# Patient Record
Sex: Male | Born: 1935
Health system: Southern US, Community
[De-identification: ages and names within clinical notes are randomized; demographics above are authoritative.]

## PROBLEM LIST (undated history)

## (undated) DIAGNOSIS — M1990 Unspecified osteoarthritis, unspecified site: Secondary | ICD-10-CM

## (undated) DIAGNOSIS — Z803 Family history of malignant neoplasm of breast: Secondary | ICD-10-CM

## (undated) DIAGNOSIS — J189 Pneumonia, unspecified organism: Secondary | ICD-10-CM

## (undated) DIAGNOSIS — Z860100 Personal history of colon polyps, unspecified: Secondary | ICD-10-CM

## (undated) DIAGNOSIS — K5730 Diverticulosis of large intestine without perforation or abscess without bleeding: Secondary | ICD-10-CM

## (undated) DIAGNOSIS — C61 Malignant neoplasm of prostate: Secondary | ICD-10-CM

## (undated) DIAGNOSIS — I10 Essential (primary) hypertension: Secondary | ICD-10-CM

## (undated) DIAGNOSIS — I2699 Other pulmonary embolism without acute cor pulmonale: Secondary | ICD-10-CM

## (undated) DIAGNOSIS — Z8042 Family history of malignant neoplasm of prostate: Secondary | ICD-10-CM

## (undated) DIAGNOSIS — R0989 Other specified symptoms and signs involving the circulatory and respiratory systems: Secondary | ICD-10-CM

## (undated) HISTORY — DX: Diverticulosis of large intestine without perforation or abscess without bleeding: K57.30

## (undated) HISTORY — DX: Personal history of colon polyps, unspecified: Z86.0100

## (undated) HISTORY — DX: Personal history of colonic polyps: Z86.010

## (undated) HISTORY — PX: COLONOSCOPY: SHX174

## (undated) HISTORY — DX: Essential (primary) hypertension: I10

## (undated) HISTORY — DX: Family history of malignant neoplasm of breast: Z80.3

## (undated) HISTORY — DX: Malignant neoplasm of prostate: C61

## (undated) HISTORY — DX: Family history of malignant neoplasm of prostate: Z80.42

---

## 1987-09-12 HISTORY — PX: KNEE SURGERY: SHX244

## 1999-09-19 ENCOUNTER — Ambulatory Visit (HOSPITAL_COMMUNITY): Admission: RE | Admit: 1999-09-19 | Discharge: 1999-09-19 | Payer: Self-pay | Admitting: Gastroenterology

## 1999-09-19 ENCOUNTER — Encounter: Payer: Self-pay | Admitting: Gastroenterology

## 2001-03-29 ENCOUNTER — Other Ambulatory Visit: Admission: RE | Admit: 2001-03-29 | Discharge: 2001-03-29 | Payer: Self-pay | Admitting: Gastroenterology

## 2001-03-29 ENCOUNTER — Encounter (INDEPENDENT_AMBULATORY_CARE_PROVIDER_SITE_OTHER): Payer: Self-pay | Admitting: Specialist

## 2004-07-19 ENCOUNTER — Ambulatory Visit: Payer: Self-pay | Admitting: Internal Medicine

## 2004-11-08 ENCOUNTER — Ambulatory Visit: Payer: Self-pay | Admitting: Internal Medicine

## 2005-02-03 ENCOUNTER — Ambulatory Visit: Payer: Self-pay | Admitting: Internal Medicine

## 2005-07-04 ENCOUNTER — Ambulatory Visit: Payer: Self-pay | Admitting: Gastroenterology

## 2005-07-25 ENCOUNTER — Ambulatory Visit: Payer: Self-pay | Admitting: Gastroenterology

## 2005-08-14 ENCOUNTER — Ambulatory Visit: Payer: Self-pay | Admitting: Internal Medicine

## 2005-08-18 ENCOUNTER — Ambulatory Visit: Payer: Self-pay | Admitting: Internal Medicine

## 2006-09-13 ENCOUNTER — Ambulatory Visit: Payer: Self-pay | Admitting: Internal Medicine

## 2006-09-13 LAB — CONVERTED CEMR LAB
ALT: 30 units/L (ref 0–40)
AST: 28 units/L (ref 0–37)
Albumin: 3.8 g/dL (ref 3.5–5.2)
Alkaline Phosphatase: 49 units/L (ref 39–117)
BUN: 15 mg/dL (ref 6–23)
Basophils Absolute: 0 10*3/uL (ref 0.0–0.1)
Basophils Relative: 0.6 % (ref 0.0–1.0)
Bilirubin Urine: NEGATIVE
CO2: 32 meq/L (ref 19–32)
Calcium: 9.1 mg/dL (ref 8.4–10.5)
Chloride: 102 meq/L (ref 96–112)
Chol/HDL Ratio, serum: 4.2
Cholesterol: 190 mg/dL (ref 0–200)
Creatinine, Ser: 1.1 mg/dL (ref 0.4–1.5)
Eosinophil percent: 4.9 % (ref 0.0–5.0)
GFR calc non Af Amer: 71 mL/min
Glomerular Filtration Rate, Af Am: 85 mL/min/{1.73_m2}
Glucose, Bld: 105 mg/dL — ABNORMAL HIGH (ref 70–99)
HCT: 42 % (ref 39.0–52.0)
HDL: 45.6 mg/dL (ref >39.0)
Hemoglobin, Urine: NEGATIVE
Hemoglobin: 14.4 g/dL (ref 13.0–17.0)
Ketones, ur: NEGATIVE mg/dL
LDL Cholesterol: 131 mg/dL — ABNORMAL HIGH (ref 0–99)
Leukocytes, UA: NEGATIVE
Lymphocytes Relative: 39.9 % (ref 12.0–46.0)
MCHC: 34.3 g/dL (ref 30.0–36.0)
MCV: 95.3 fL (ref 78.0–100.0)
Monocytes Absolute: 0.5 10*3/uL (ref 0.2–0.7)
Monocytes Relative: 11.4 % — ABNORMAL HIGH (ref 3.0–11.0)
Neutro Abs: 1.9 10*3/uL (ref 1.4–7.7)
Neutrophils Relative %: 43.2 % (ref 43.0–77.0)
Nitrite: NEGATIVE
PSA: 1.66 ng/mL (ref 0.10–4.00)
Platelets: 222 10*3/uL (ref 150–400)
Potassium: 4.1 meq/L (ref 3.5–5.1)
RBC: 4.4 M/uL (ref 4.22–5.81)
RDW: 12 % (ref 11.5–14.6)
Sodium: 140 meq/L (ref 135–145)
Specific Gravity, Urine: 1.025 (ref 1.000–1.03)
TSH: 1.24 microintl units/mL (ref 0.35–5.50)
Total Bilirubin: 1.4 mg/dL — ABNORMAL HIGH (ref 0.3–1.2)
Total Protein, Urine: NEGATIVE mg/dL
Total Protein: 6.6 g/dL (ref 6.0–8.3)
Triglyceride fasting, serum: 68 mg/dL (ref 0–149)
Urine Glucose: NEGATIVE mg/dL
Urobilinogen, UA: 0.2 (ref 0.0–1.0)
VLDL: 14 mg/dL (ref 0–40)
WBC: 4.4 10*3/uL — ABNORMAL LOW (ref 4.5–10.5)
pH: 6.5 (ref 5.0–8.0)

## 2006-09-20 ENCOUNTER — Ambulatory Visit: Payer: Self-pay | Admitting: Internal Medicine

## 2007-06-10 ENCOUNTER — Encounter: Payer: Self-pay | Admitting: *Deleted

## 2007-06-10 DIAGNOSIS — Z860100 Personal history of colon polyps, unspecified: Secondary | ICD-10-CM | POA: Insufficient documentation

## 2007-06-10 DIAGNOSIS — K5730 Diverticulosis of large intestine without perforation or abscess without bleeding: Secondary | ICD-10-CM | POA: Insufficient documentation

## 2007-06-10 DIAGNOSIS — I10 Essential (primary) hypertension: Secondary | ICD-10-CM | POA: Insufficient documentation

## 2007-09-23 ENCOUNTER — Ambulatory Visit: Payer: Self-pay | Admitting: Internal Medicine

## 2007-09-23 LAB — CONVERTED CEMR LAB
ALT: 28 units/L (ref 0–53)
AST: 27 units/L (ref 0–37)
Albumin: 4 g/dL (ref 3.5–5.2)
Alkaline Phosphatase: 55 units/L (ref 39–117)
BUN: 10 mg/dL (ref 6–23)
Basophils Absolute: 0 10*3/uL (ref 0.0–0.1)
Basophils Relative: 0.2 % (ref 0.0–1.0)
Bilirubin Urine: NEGATIVE
Bilirubin, Direct: 0.2 mg/dL (ref 0.0–0.3)
CO2: 33 meq/L — ABNORMAL HIGH (ref 19–32)
Calcium: 9.1 mg/dL (ref 8.4–10.5)
Chloride: 100 meq/L (ref 96–112)
Cholesterol: 203 mg/dL (ref 0–200)
Creatinine, Ser: 1 mg/dL (ref 0.4–1.5)
Direct LDL: 126.3 mg/dL
Eosinophils Absolute: 0.1 10*3/uL (ref 0.0–0.6)
Eosinophils Relative: 2 % (ref 0.0–5.0)
GFR calc Af Amer: 95 mL/min
GFR calc non Af Amer: 79 mL/min
Glucose, Bld: 111 mg/dL — ABNORMAL HIGH (ref 70–99)
HCT: 42.8 % (ref 39.0–52.0)
HDL: 40.4 mg/dL (ref >39.0)
Hemoglobin, Urine: NEGATIVE
Hemoglobin: 14.7 g/dL (ref 13.0–17.0)
Ketones, ur: NEGATIVE mg/dL
Leukocytes, UA: NEGATIVE
Lymphocytes Relative: 26.3 % (ref 12.0–46.0)
MCHC: 34.4 g/dL (ref 30.0–36.0)
MCV: 94.4 fL (ref 78.0–100.0)
Monocytes Absolute: 0.4 10*3/uL (ref 0.2–0.7)
Monocytes Relative: 7.8 % (ref 3.0–11.0)
Neutro Abs: 3.3 10*3/uL (ref 1.4–7.7)
Neutrophils Relative %: 63.7 % (ref 43.0–77.0)
Nitrite: NEGATIVE
PSA: 2.05 ng/mL (ref 0.10–4.00)
Platelets: 209 10*3/uL (ref 150–400)
Potassium: 4.6 meq/L (ref 3.5–5.1)
RBC: 4.53 M/uL (ref 4.22–5.81)
RDW: 12.2 % (ref 11.5–14.6)
Sodium: 139 meq/L (ref 135–145)
Specific Gravity, Urine: 1.015 (ref 1.000–1.03)
TSH: 0.81 microintl units/mL (ref 0.35–5.50)
Total Bilirubin: 1.4 mg/dL — ABNORMAL HIGH (ref 0.3–1.2)
Total CHOL/HDL Ratio: 5
Total Protein, Urine: NEGATIVE mg/dL
Total Protein: 7 g/dL (ref 6.0–8.3)
Triglycerides: 81 mg/dL (ref 0–149)
Urine Glucose: NEGATIVE mg/dL
Urobilinogen, UA: 0.2 (ref 0.0–1.0)
VLDL: 16 mg/dL (ref 0–40)
WBC: 5.1 10*3/uL (ref 4.5–10.5)
pH: 7 (ref 5.0–8.0)

## 2007-09-26 ENCOUNTER — Encounter: Payer: Self-pay | Admitting: Internal Medicine

## 2007-09-27 ENCOUNTER — Ambulatory Visit: Payer: Self-pay | Admitting: Internal Medicine

## 2008-03-23 ENCOUNTER — Ambulatory Visit: Payer: Self-pay | Admitting: Internal Medicine

## 2008-03-23 LAB — CONVERTED CEMR LAB
BUN: 13 mg/dL (ref 6–23)
CO2: 32 meq/L (ref 19–32)
Calcium: 9.3 mg/dL (ref 8.4–10.5)
Chloride: 105 meq/L (ref 96–112)
Creatinine, Ser: 1.1 mg/dL (ref 0.4–1.5)
GFR calc Af Amer: 85 mL/min
GFR calc non Af Amer: 70 mL/min
Glucose, Bld: 105 mg/dL — ABNORMAL HIGH (ref 70–99)
Potassium: 4.2 meq/L (ref 3.5–5.1)
Sodium: 142 meq/L (ref 135–145)

## 2008-03-25 ENCOUNTER — Ambulatory Visit: Payer: Self-pay | Admitting: Internal Medicine

## 2008-07-01 ENCOUNTER — Ambulatory Visit: Payer: Self-pay | Admitting: Internal Medicine

## 2008-08-26 ENCOUNTER — Ambulatory Visit: Payer: Self-pay | Admitting: Internal Medicine

## 2008-08-26 LAB — CONVERTED CEMR LAB
BUN: 12 mg/dL (ref 6–23)
CO2: 31 meq/L (ref 19–32)
Calcium: 9 mg/dL (ref 8.4–10.5)
Chloride: 104 meq/L (ref 96–112)
Creatinine, Ser: 1.1 mg/dL (ref 0.4–1.5)
GFR calc Af Amer: 85 mL/min
GFR calc non Af Amer: 70 mL/min
Glucose, Bld: 99 mg/dL (ref 70–99)
Potassium: 4 meq/L (ref 3.5–5.1)
Sodium: 140 meq/L (ref 135–145)

## 2008-09-02 ENCOUNTER — Ambulatory Visit: Payer: Self-pay | Admitting: Internal Medicine

## 2009-02-18 ENCOUNTER — Ambulatory Visit: Payer: Self-pay | Admitting: Internal Medicine

## 2009-02-18 LAB — CONVERTED CEMR LAB
ALT: 22 units/L (ref 0–53)
AST: 27 units/L (ref 0–37)
Albumin: 3.9 g/dL (ref 3.5–5.2)
Alkaline Phosphatase: 51 units/L (ref 39–117)
BUN: 14 mg/dL (ref 6–23)
Basophils Absolute: 0 10*3/uL (ref 0.0–0.1)
Basophils Relative: 0.2 % (ref 0.0–3.0)
Bilirubin Urine: NEGATIVE
Bilirubin, Direct: 0.2 mg/dL (ref 0.0–0.3)
CO2: 31 meq/L (ref 19–32)
Calcium: 8.9 mg/dL (ref 8.4–10.5)
Chloride: 105 meq/L (ref 96–112)
Cholesterol: 166 mg/dL (ref 0–200)
Creatinine, Ser: 1.1 mg/dL (ref 0.4–1.5)
Eosinophils Absolute: 0.1 10*3/uL (ref 0.0–0.7)
Eosinophils Relative: 3.7 % (ref 0.0–5.0)
GFR calc non Af Amer: 84.71 mL/min (ref >60)
Glucose, Bld: 96 mg/dL (ref 70–99)
HCT: 42.4 % (ref 39.0–52.0)
HDL: 38.3 mg/dL — ABNORMAL LOW (ref >39.00)
Hemoglobin, Urine: NEGATIVE
Hemoglobin: 14 g/dL (ref 13.0–17.0)
Ketones, ur: NEGATIVE mg/dL
LDL Cholesterol: 113 mg/dL — ABNORMAL HIGH (ref 0–99)
Leukocytes, UA: NEGATIVE
Lymphocytes Relative: 40.8 % (ref 12.0–46.0)
Lymphs Abs: 1.6 10*3/uL (ref 0.7–4.0)
MCHC: 33.1 g/dL (ref 30.0–36.0)
MCV: 97.2 fL (ref 78.0–100.0)
Monocytes Absolute: 0.5 10*3/uL (ref 0.1–1.0)
Monocytes Relative: 12.2 % — ABNORMAL HIGH (ref 3.0–12.0)
Neutro Abs: 1.7 10*3/uL (ref 1.4–7.7)
Neutrophils Relative %: 43.1 % (ref 43.0–77.0)
Nitrite: NEGATIVE
PSA: 2.99 ng/mL (ref 0.10–4.00)
Platelets: 184 10*3/uL (ref 150.0–400.0)
Potassium: 4.2 meq/L (ref 3.5–5.1)
RBC: 4.37 M/uL (ref 4.22–5.81)
RDW: 11.9 % (ref 11.5–14.6)
Sodium: 142 meq/L (ref 135–145)
Specific Gravity, Urine: 1.02 (ref 1.000–1.030)
TSH: 1.13 microintl units/mL (ref 0.35–5.50)
Total Bilirubin: 1.1 mg/dL (ref 0.3–1.2)
Total CHOL/HDL Ratio: 4
Total Protein, Urine: NEGATIVE mg/dL
Total Protein: 6.8 g/dL (ref 6.0–8.3)
Triglycerides: 72 mg/dL (ref 0.0–149.0)
Urine Glucose: NEGATIVE mg/dL
Urobilinogen, UA: 0.2 (ref 0.0–1.0)
VLDL: 14.4 mg/dL (ref 0.0–40.0)
WBC: 3.9 10*3/uL — ABNORMAL LOW (ref 4.5–10.5)
pH: 6.5 (ref 5.0–8.0)

## 2009-03-03 ENCOUNTER — Ambulatory Visit: Payer: Self-pay | Admitting: Internal Medicine

## 2009-03-03 DIAGNOSIS — N529 Male erectile dysfunction, unspecified: Secondary | ICD-10-CM | POA: Insufficient documentation

## 2009-03-03 DIAGNOSIS — R972 Elevated prostate specific antigen [PSA]: Secondary | ICD-10-CM

## 2009-08-18 ENCOUNTER — Ambulatory Visit: Payer: Self-pay | Admitting: Internal Medicine

## 2009-08-18 LAB — CONVERTED CEMR LAB: PSA: 3.26 ng/mL (ref 0.10–4.00)

## 2009-08-25 ENCOUNTER — Ambulatory Visit: Payer: Self-pay | Admitting: Internal Medicine

## 2009-08-25 DIAGNOSIS — Z87891 Personal history of nicotine dependence: Secondary | ICD-10-CM

## 2011-01-27 NOTE — Assessment & Plan Note (Signed)
[name]                           PRIMARY CARE OFFICE NOTE   Peter Wood, Peter Wood                     MRN:          [id number]  DATE:09/20/2006                            DOB:          Mar 23, 1935    The patient is a 76 year old male who presents for a wellness  examination.   Past medical history, family history, social history as per July 19, 2004 note.   CURRENT MEDICATIONS:  Has not been taking any.   ALLERGIES:  None.   REVIEW OF SYSTEMS:  Negative for chest pain or shortness of breath.  No  syncope, no neurology complaints.  The rest of the 18-point system  review is negative.   PHYSICAL EXAMINATION:  He looks well.  Blood pressure 143/82, pulse 58, temp 97.4, weight 173 pounds.  He is in no acute distress.  HEENT:  Moist mucosa.  NECK:  Supple, no thyromegaly or bruit.  LUNGS:  Clear to auscultation and percussion, no wheeze or rales.  HEART:  S1, S2, no murmur, no gallop.  ABDOMEN:  Soft, nontender, without organomegaly or masses felt.  LOWER EXTREMITIES:  Without edema.  He is alert, oriented and cooperative, denies being depressed.  RECTAL:  Examination reveals normal prostate, stool guaiac negative.  No  masses.   Labs on September 13, 2006:  CBC normal, CMET normal, glucose 105, TSH  normal, PSA 1.6, urinalysis normal, cholesterol 190, LDL 141.   ASSESSMENT AND PLAN:  1. Normal wellness examination.  Age/health-related issues discussed.      Healthy lifestyle discussed.  Repeat exam in 12 months.  Chest x-      ray was done in 2005.  We will obtain another.  Start baby aspirin      daily.  2. History of elevated blood pressure.  Controlled with lifestyle.  If      systolic gets greater than 140 I would start back on enalapril/HCT.  3. History of colon polyps.  He will be due colonoscopy in 2013.     Georgina Quint. Plotnikov, MD  Electronically Signed    AVP/MedQ  DD: 09/22/2006  DT: 09/22/2006  Job #: [id number]

## 2011-08-31 ENCOUNTER — Encounter: Payer: Self-pay | Admitting: Internal Medicine

## 2011-09-01 ENCOUNTER — Ambulatory Visit (INDEPENDENT_AMBULATORY_CARE_PROVIDER_SITE_OTHER): Payer: Medicare Other | Admitting: Internal Medicine

## 2011-09-01 ENCOUNTER — Encounter: Payer: Self-pay | Admitting: Internal Medicine

## 2011-09-01 VITALS — BP 148/98 | HR 80 | Temp 97.7°F | Resp 16 | Wt 171.0 lb

## 2011-09-01 DIAGNOSIS — Z86010 Personal history of colonic polyps: Secondary | ICD-10-CM

## 2011-09-01 DIAGNOSIS — Z7189 Other specified counseling: Secondary | ICD-10-CM | POA: Insufficient documentation

## 2011-09-01 DIAGNOSIS — I10 Essential (primary) hypertension: Secondary | ICD-10-CM

## 2011-09-01 DIAGNOSIS — Z0000 Encounter for general adult medical examination without abnormal findings: Secondary | ICD-10-CM

## 2011-09-01 DIAGNOSIS — N320 Bladder-neck obstruction: Secondary | ICD-10-CM

## 2011-09-01 DIAGNOSIS — E785 Hyperlipidemia, unspecified: Secondary | ICD-10-CM

## 2011-09-01 MED ORDER — VITAMIN D 1000 UNITS PO TABS: 1000.0000 [IU] | ORAL_TABLET | Freq: Every day | ORAL | Status: AC

## 2011-09-01 MED ORDER — AMLODIPINE BESY-BENAZEPRIL HCL 5-20 MG PO CAPS: 1.0000 | ORAL_CAPSULE | Freq: Every day | ORAL | Status: DC

## 2011-09-01 NOTE — Assessment & Plan Note (Signed)
The patient is here for annual Medicare wellness examination and management of other chronic and acute problems.   The risk factors are reflected in the social history.  The roster of all physicians providing medical care to patient - is listed in the Snapshot section of the chart.  Activities of daily living:  The patient is 100% inedpendent in all ADLs: dressing, toileting, feeding as well as independent mobility  Home safety : The patient has smoke detectors in the home. They wear seatbelts.No firearms at home ( firearms are present in the home, kept in a safe fashion). There is no violence in the home.   There is no risks for hepatitis, STDs or HIV. There is no   history of blood transfusion. They have no travel history to infectious disease endemic areas of the world.  The patient has (has not) seen their dentist in the last six month. They have (not) seen their eye doctor in the last year. They deny (admit to) any hearing difficulty and have not had audiologic testing in the last year.  They do not  have excessive sun exposure. Discussed the need for sun protection: hats, long sleeves and use of sunscreen if there is significant sun exposure.   Diet: the importance of a healthy diet is discussed. They do have a healthy (unhealthy-high fat/fast food) diet.  The patient has a regular exercise program: 7 per week - walking, springs.  The benefits of regular aerobic exercise were discussed.  Depression screen: there are no signs or vegative symptoms of depression- irritability, change in appetite, anhedonia, sadness/tearfullness.  Cognitive assessment: the patient manages all their financial and personal affairs and is actively engaged. They could relate day,date,year and events; recalled 3/3 objects at 3 minutes; performed clock-face test normally.  The following portions of the patient's history were reviewed and updated as appropriate: allergies, current medications, past family history,  past medical history,  past surgical history, past social history  and problem list.  Vision, hearing, body mass index were assessed and reviewed.   During the course of the visit the patient was educated and counseled about appropriate screening and preventive services including : fall prevention , diabetes screening, nutrition counseling, colorectal cancer screening, and recommended immunizations.

## 2011-09-01 NOTE — Patient Instructions (Signed)
Normal BP<130/85 

## 2011-09-01 NOTE — Assessment & Plan Note (Signed)
Pt states BP is nl at home 

## 2011-09-01 NOTE — Assessment & Plan Note (Signed)
F/u colon 

## 2011-09-01 NOTE — Progress Notes (Signed)
  Subjective:    Patient ID: Peter Wood, male    DOB: 1935/04/20, 76 y.o.   MRN: [id number]  HPI The patient is here for a wellness exam. The patient has been doing well overall without major physical or psychological issues going on lately.  BP is ok at home  Review of Systems  Constitutional: Negative for chills, appetite change, fatigue and unexpected weight change.  HENT: Negative for hearing loss, nosebleeds, congestion, sore throat, sneezing, trouble swallowing and neck pain.   Eyes: Negative for itching and visual disturbance.  Respiratory: Negative for cough.   Cardiovascular: Negative for chest pain, palpitations and leg swelling.  Gastrointestinal: Negative for nausea, diarrhea, blood in stool and abdominal distention.  Genitourinary: Negative for frequency and hematuria.  Musculoskeletal: Negative for back pain, joint swelling, arthralgias and gait problem.  Skin: Negative for color change, rash and wound.  Neurological: Negative for dizziness, tremors, facial asymmetry, speech difficulty, weakness and numbness.  Psychiatric/Behavioral: Negative for suicidal ideas, sleep disturbance, dysphoric mood and agitation. The patient is not nervous/anxious.        Objective:   Physical Exam  Constitutional: He is oriented to person, place, and time. He appears well-developed and well-nourished. No distress.  HENT:  Mouth/Throat: Oropharynx is clear and moist.  Eyes: Conjunctivae are normal. Pupils are equal, round, and reactive to light.  Neck: Normal range of motion. No JVD present. No thyromegaly present.  Cardiovascular: Normal rate, regular rhythm and intact distal pulses.  Exam reveals no gallop and no friction rub.   Murmur (1-2/6 syst) heard. Pulmonary/Chest: Effort normal and breath sounds normal. No respiratory distress. He has no wheezes. He has no rales. He exhibits no tenderness.  Abdominal: Soft. Bowel sounds are normal. He exhibits no distension and no mass.  There is no tenderness. There is no rebound and no guarding.  Genitourinary: Rectum normal, prostate normal and penis normal. Guaiac negative stool. No penile tenderness.  Musculoskeletal: Normal range of motion. He exhibits no edema and no tenderness.  Lymphadenopathy:    He has no cervical adenopathy.  Neurological: He is alert and oriented to person, place, and time. He has normal reflexes. No cranial nerve deficit. He exhibits normal muscle tone. Coordination normal.  Skin: Skin is warm and dry. No rash noted.  Psychiatric: He has a normal mood and affect. His behavior is normal. Judgment and thought content normal.          Assessment & Plan:    He declined all shots

## 2011-09-04 ENCOUNTER — Other Ambulatory Visit (INDEPENDENT_AMBULATORY_CARE_PROVIDER_SITE_OTHER): Payer: Medicare Other

## 2011-09-04 DIAGNOSIS — N320 Bladder-neck obstruction: Secondary | ICD-10-CM

## 2011-09-04 DIAGNOSIS — E785 Hyperlipidemia, unspecified: Secondary | ICD-10-CM

## 2011-09-04 DIAGNOSIS — Z0000 Encounter for general adult medical examination without abnormal findings: Secondary | ICD-10-CM

## 2011-09-04 LAB — LIPID PANEL
Cholesterol: 167 mg/dL (ref 0–200)
HDL: 40.4 mg/dL (ref >39.00)
LDL Cholesterol: 108 mg/dL — ABNORMAL HIGH (ref 0–99)
Total CHOL/HDL Ratio: 4
Triglycerides: 92 mg/dL (ref 0.0–149.0)
VLDL: 18.4 mg/dL (ref 0.0–40.0)

## 2011-09-04 LAB — CBC WITH DIFFERENTIAL/PLATELET
Basophils Relative: 0.5 % (ref 0.0–3.0)
Eosinophils Relative: 5.7 % — ABNORMAL HIGH (ref 0.0–5.0)
Lymphocytes Relative: 30.9 % (ref 12.0–46.0)
MCV: 97 fl (ref 78.0–100.0)
Monocytes Relative: 11.6 % (ref 3.0–12.0)
Neutrophils Relative %: 51.3 % (ref 43.0–77.0)
Platelets: 232 10*3/uL (ref 150.0–400.0)
RBC: 4.23 Mil/uL (ref 4.22–5.81)
WBC: 4.9 10*3/uL (ref 4.5–10.5)

## 2011-09-04 LAB — COMPREHENSIVE METABOLIC PANEL
AST: 26 U/L (ref 0–37)
Albumin: 3.9 g/dL (ref 3.5–5.2)
Alkaline Phosphatase: 54 U/L (ref 39–117)
BUN: 15 mg/dL (ref 6–23)
Creatinine, Ser: 1 mg/dL (ref 0.4–1.5)
Glucose, Bld: 92 mg/dL (ref 70–99)
Potassium: 4 mEq/L (ref 3.5–5.1)

## 2011-09-04 LAB — URINALYSIS
Hgb urine dipstick: NEGATIVE
Nitrite: NEGATIVE
Specific Gravity, Urine: 1.02 (ref 1.000–1.030)
Urine Glucose: NEGATIVE
Urobilinogen, UA: 0.2 (ref 0.0–1.0)

## 2011-09-06 ENCOUNTER — Other Ambulatory Visit: Payer: Self-pay | Admitting: *Deleted

## 2011-09-06 DIAGNOSIS — R972 Elevated prostate specific antigen [PSA]: Secondary | ICD-10-CM

## 2011-09-26 ENCOUNTER — Encounter: Payer: Self-pay | Admitting: Gastroenterology

## 2011-10-20 ENCOUNTER — Ambulatory Visit (AMBULATORY_SURGERY_CENTER): Payer: Medicare Other | Admitting: *Deleted

## 2011-10-20 VITALS — Ht 67.5 in | Wt 170.0 lb

## 2011-10-20 DIAGNOSIS — Z1211 Encounter for screening for malignant neoplasm of colon: Secondary | ICD-10-CM

## 2011-10-20 MED ORDER — PEG-KCL-NACL-NASULF-NA ASC-C 100 G PO SOLR: ORAL | Status: DC

## 2011-11-03 ENCOUNTER — Encounter: Payer: Self-pay | Admitting: Gastroenterology

## 2011-11-03 ENCOUNTER — Ambulatory Visit (AMBULATORY_SURGERY_CENTER): Payer: Medicare Other | Admitting: Gastroenterology

## 2011-11-03 VITALS — BP 164/82 | HR 60 | Temp 98.0°F | Resp 20 | Ht 67.5 in | Wt 170.0 lb

## 2011-11-03 DIAGNOSIS — Z860100 Personal history of colon polyps, unspecified: Secondary | ICD-10-CM

## 2011-11-03 DIAGNOSIS — Z1211 Encounter for screening for malignant neoplasm of colon: Secondary | ICD-10-CM

## 2011-11-03 DIAGNOSIS — D126 Benign neoplasm of colon, unspecified: Secondary | ICD-10-CM

## 2011-11-03 DIAGNOSIS — K5730 Diverticulosis of large intestine without perforation or abscess without bleeding: Secondary | ICD-10-CM

## 2011-11-03 MED ORDER — SODIUM CHLORIDE 0.9 % IV SOLN: 500.0000 mL | INTRAVENOUS | Status: DC

## 2011-11-03 NOTE — Patient Instructions (Signed)

## 2011-11-03 NOTE — Progress Notes (Signed)
Pt given information on polyps, diverticulosis and high fiber diets  Patient did not experience any of the following events: a burn prior to discharge; a fall within the facility; wrong site/side/patient/procedure/implant event; or a hospital transfer or hospital admission upon discharge from the facility. [phone]) Patient did not have preoperative order for IV antibiotic SSI prophylaxis. [phone])

## 2011-11-03 NOTE — Op Note (Signed)
[facility] [address] [place], [place]  [address]  COLONOSCOPY PROCEDURE REPORT  PATIENT:  Peter Wood, Peter Wood  MR#:  [id number] BIRTHDATE:  1934/11/09, 76 yrs. old  GENDER:  male ENDOSCOPIST:  Rachael Fee, MD PROCEDURE DATE:  11/03/2011 PROCEDURE:  Colonoscopy with snare polypectomy ASA CLASS:  Class II INDICATIONS:  2002 colonoscopy with TA, 2006 colonsocopy no polyps (recommended by [facility] to have repeat exam in 7 years) MEDICATIONS:   Fentanyl 50 mcg IV, These medications were titrated to patient response per physician's verbal order, Versed 3 mg IV  DESCRIPTION OF PROCEDURE:   After the risks benefits and alternatives of the procedure were thoroughly explained, informed consent was obtained.  Digital rectal exam was performed and revealed no rectal masses.   The LB CF-[id number] endoscope was introduced through the anus and advanced to the cecum, which was identified by both the appendix and ileocecal valve, without limitations.  The quality of the prep was good..  The instrument was then slowly withdrawn as the colon was fully examined. <<PROCEDUREIMAGES>> FINDINGS: Two sessile polyps were found, removed with cold snare, sent to pathology (jar 1). These ranged in size from 3 to 5mm, located in sigmoid and rectum segments (see image5 and image6). Mild diverticulosis was found in the sigmoid to descending colon segments (see image1).  This was otherwise a normal examination of the colon (see image3, image4, and image7).   Retroflexed views in the rectum revealed no abnormalities. COMPLICATIONS:  None  ENDOSCOPIC IMPRESSION: 1) Two small polyps, both removed and both sent to pathology 2) Mild diverticulosis in the sigmoid to descending colon segments 3) Otherwise normal examination  RECOMMENDATIONS: 1) Given your personal history of adenomatous (pre-cancerous) polyps, you will need a repeat colonoscopy in 5 years even if the polyps removed today are NOT  precancerous 2) You will receive a letter within 1-2 weeks with the results of your biopsy as well as final recommendations. Please call my office if you have not received a letter after 3 weeks. 3) You DO NOT NEED TO AVOID SEEDS, NUTS, BERRIES.  ______________________________ Rachael Fee, MD  n. eSIGNED:   Rachael Fee at 11/03/2011 09:47 AM  Milus Banister, [phone]

## 2011-11-06 ENCOUNTER — Telehealth: Payer: Self-pay | Admitting: *Deleted

## 2011-11-06 NOTE — Telephone Encounter (Signed)
  Follow up Call-  Call back number 11/03/2011  Post procedure Call Back phone  # [phone]  Permission to leave phone message Yes     Patient questions:  Do you have a fever, pain , or abdominal swelling? no Pain Score  0 *  Have you tolerated food without any problems? yes  Have you been able to return to your normal activities? yes  Do you have any questions about your discharge instructions: Diet   no Medications  no Follow up visit  no  Do you have questions or concerns about your Care? no  Actions: * If pain score is 4 or above: No action needed, pain <4.

## 2011-11-08 ENCOUNTER — Encounter: Payer: Self-pay | Admitting: Gastroenterology

## 2011-12-05 ENCOUNTER — Ambulatory Visit (INDEPENDENT_AMBULATORY_CARE_PROVIDER_SITE_OTHER): Payer: Medicare Other | Admitting: Internal Medicine

## 2011-12-05 ENCOUNTER — Encounter: Payer: Self-pay | Admitting: Internal Medicine

## 2011-12-05 VITALS — BP 168/100 | HR 84 | Temp 98.8°F | Resp 16 | Wt 170.0 lb

## 2011-12-05 DIAGNOSIS — I10 Essential (primary) hypertension: Secondary | ICD-10-CM

## 2011-12-05 MED ORDER — BENAZEPRIL HCL 40 MG PO TABS: 40.0000 mg | ORAL_TABLET | Freq: Every day | ORAL | Status: DC

## 2011-12-05 MED ORDER — AMLODIPINE BESYLATE 5 MG PO TABS: 5.0000 mg | ORAL_TABLET | Freq: Every day | ORAL | Status: DC

## 2011-12-05 NOTE — Assessment & Plan Note (Signed)
D/c lotrel Increase Benazepril to 40 mg a day Cont Norvasc

## 2011-12-05 NOTE — Progress Notes (Signed)
Patient ID: Peter Wood, male   DOB: 13-May-1935, 77 y.o.   MRN: [id number]  Subjective:    Patient ID: Peter Wood, male    DOB: June 04, 1935, 77 y.o.   MRN: [id number]  HPI  F/u elev BP  BP Readings from Last 3 Encounters:  12/05/11 168/100  11/03/11 164/82  09/01/11 148/98   Wt Readings from Last 3 Encounters:  12/05/11 170 lb (77.111 kg)  11/03/11 170 lb (77.111 kg)  10/20/11 170 lb (77.111 kg)      Review of Systems  Constitutional: Negative for chills, appetite change, fatigue and unexpected weight change.  HENT: Negative for hearing loss, nosebleeds, congestion, sore throat, sneezing, trouble swallowing and neck pain.   Eyes: Negative for itching and visual disturbance.  Respiratory: Negative for cough.   Cardiovascular: Negative for chest pain, palpitations and leg swelling.  Gastrointestinal: Negative for nausea, diarrhea, blood in stool and abdominal distention.  Genitourinary: Negative for frequency and hematuria.  Musculoskeletal: Negative for back pain, joint swelling, arthralgias and gait problem.  Skin: Negative for color change, rash and wound.  Neurological: Negative for dizziness, tremors, facial asymmetry, speech difficulty, weakness and numbness.  Psychiatric/Behavioral: Negative for suicidal ideas, sleep disturbance, dysphoric mood and agitation. The patient is not nervous/anxious.        Objective:   Physical Exam  Constitutional: He is oriented to person, place, and time. He appears well-developed and well-nourished. No distress.  HENT:  Mouth/Throat: Oropharynx is clear and moist.  Eyes: Conjunctivae are normal. Pupils are equal, round, and reactive to light.  Neck: Normal range of motion. No JVD present. No thyromegaly present.  Cardiovascular: Normal rate, regular rhythm and intact distal pulses.  Exam reveals no gallop and no friction rub.   Murmur (1-2/6 syst) heard. Pulmonary/Chest: Effort normal and breath sounds normal. No respiratory  distress. He has no wheezes. He has no rales. He exhibits no tenderness.  Abdominal: Soft. Bowel sounds are normal. He exhibits no distension and no mass. There is no tenderness. There is no rebound and no guarding.  Genitourinary: Rectum normal, prostate normal and penis normal. Guaiac negative stool. No penile tenderness.  Musculoskeletal: Normal range of motion. He exhibits no edema and no tenderness.  Lymphadenopathy:    He has no cervical adenopathy.  Neurological: He is alert and oriented to person, place, and time. He has normal reflexes. No cranial nerve deficit. He exhibits normal muscle tone. Coordination normal.  Skin: Skin is warm and dry. No rash noted.  Psychiatric: He has a normal mood and affect. His behavior is normal. Judgment and thought content normal.    Lab Results  Component Value Date   WBC 4.9 09/04/2011   HGB 13.7 09/04/2011   HCT 41.0 09/04/2011   PLT 232.0 09/04/2011   GLUCOSE 92 09/04/2011   CHOL 167 09/04/2011   TRIG 92.0 09/04/2011   HDL 40.40 09/04/2011   LDLDIRECT 126.3 09/23/2007   LDLCALC 108* 09/04/2011   ALT 23 09/04/2011   AST 26 09/04/2011   NA 140 09/04/2011   K 4.0 09/04/2011   CL 103 09/04/2011   CREATININE 1.0 09/04/2011   BUN 15 09/04/2011   CO2 31 09/04/2011   TSH 0.92 09/04/2011   PSA 5.63* 09/04/2011         Assessment & Plan:

## 2012-03-11 ENCOUNTER — Other Ambulatory Visit (INDEPENDENT_AMBULATORY_CARE_PROVIDER_SITE_OTHER): Payer: Medicare Other

## 2012-03-11 ENCOUNTER — Encounter: Payer: Self-pay | Admitting: Internal Medicine

## 2012-03-11 ENCOUNTER — Ambulatory Visit (INDEPENDENT_AMBULATORY_CARE_PROVIDER_SITE_OTHER): Payer: Medicare Other | Admitting: Internal Medicine

## 2012-03-11 VITALS — BP 110/88 | HR 64 | Temp 98.4°F | Resp 16 | Wt 165.0 lb

## 2012-03-11 DIAGNOSIS — I10 Essential (primary) hypertension: Secondary | ICD-10-CM

## 2012-03-11 DIAGNOSIS — R972 Elevated prostate specific antigen [PSA]: Secondary | ICD-10-CM

## 2012-03-11 LAB — PSA, TOTAL AND FREE
PSA, Free Pct: 22 % — ABNORMAL LOW (ref >25)
PSA, Free: 1.35 ng/mL
PSA: 6.12 ng/mL — ABNORMAL HIGH (ref <=4.00)

## 2012-03-11 NOTE — Assessment & Plan Note (Signed)
Better -  Continue with current prescription therapy as reflected on the Med list. RTC 12 mo

## 2012-03-11 NOTE — Assessment & Plan Note (Signed)
PSA, free PSA

## 2012-03-11 NOTE — Progress Notes (Signed)
   Subjective:    Patient ID: Peter Wood, male    DOB: [birth year], 77 y.o.   MRN: [id number]  HPI  F/u elev BP, HTN  BP Readings from Last 3 Encounters:  03/11/12 110/88  12/05/11 168/100  11/03/11 164/82   Wt Readings from Last 3 Encounters:  03/11/12 165 lb (74.844 kg)  12/05/11 170 lb (77.111 kg)  11/03/11 170 lb (77.111 kg)      Review of Systems  Constitutional: Negative for chills, appetite change, fatigue and unexpected weight change.  HENT: Negative for hearing loss, nosebleeds, congestion, sore throat, sneezing, trouble swallowing and neck pain.   Eyes: Negative for itching and visual disturbance.  Respiratory: Negative for cough.   Cardiovascular: Negative for chest pain, palpitations and leg swelling.  Gastrointestinal: Negative for nausea, diarrhea, blood in stool and abdominal distention.  Genitourinary: Negative for frequency and hematuria.  Musculoskeletal: Negative for back pain, joint swelling, arthralgias and gait problem.  Skin: Negative for color change, rash and wound.  Neurological: Negative for dizziness, tremors, facial asymmetry, speech difficulty, weakness and numbness.  Psychiatric/Behavioral: Negative for suicidal ideas, disturbed wake/sleep cycle, dysphoric mood and agitation. The patient is not nervous/anxious.        Objective:   Physical Exam  Constitutional: He is oriented to person, place, and time. He appears well-developed and well-nourished. No distress.  HENT:  Mouth/Throat: Oropharynx is clear and moist.  Eyes: Conjunctivae are normal. Pupils are equal, round, and reactive to light.  Neck: Normal range of motion. No JVD present. No thyromegaly present.  Cardiovascular: Normal rate, regular rhythm and intact distal pulses.  Exam reveals no gallop and no friction rub.   Murmur (1-2/6 syst) heard. Pulmonary/Chest: Effort normal and breath sounds normal. No respiratory distress. He has no wheezes. He has no rales. He exhibits no  tenderness.  Abdominal: Soft. Bowel sounds are normal. He exhibits no distension and no mass. There is no tenderness. There is no rebound and no guarding.  Genitourinary: Guaiac negative stool. No penile tenderness.  Musculoskeletal: Normal range of motion. He exhibits no edema and no tenderness.  Lymphadenopathy:    He has no cervical adenopathy.  Neurological: He is alert and oriented to person, place, and time. He has normal reflexes. No cranial nerve deficit. He exhibits normal muscle tone. Coordination normal.  Skin: Skin is warm and dry. No rash noted.  Psychiatric: He has a normal mood and affect. His behavior is normal. Judgment and thought content normal.    Lab Results  Component Value Date   WBC 4.9 09/04/2011   HGB 13.7 09/04/2011   HCT 41.0 09/04/2011   PLT 232.0 09/04/2011   GLUCOSE 92 09/04/2011   CHOL 167 09/04/2011   TRIG 92.0 09/04/2011   HDL 40.40 09/04/2011   LDLDIRECT 126.3 09/23/2007   LDLCALC 108* 09/04/2011   ALT 23 09/04/2011   AST 26 09/04/2011   NA 140 09/04/2011   K 4.0 09/04/2011   CL 103 09/04/2011   CREATININE 1.0 09/04/2011   BUN 15 09/04/2011   CO2 31 09/04/2011   TSH 0.92 09/04/2011   PSA 5.63* 09/04/2011         Assessment & Plan:

## 2012-03-12 ENCOUNTER — Telehealth: Payer: Self-pay | Admitting: Internal Medicine

## 2012-03-12 DIAGNOSIS — R972 Elevated prostate specific antigen [PSA]: Secondary | ICD-10-CM

## 2012-03-12 NOTE — Telephone Encounter (Signed)
Called pt- no answer/machine. Will retry later.

## 2012-03-12 NOTE — Telephone Encounter (Signed)
PSA is elevated. I would suggest a Urol consult. Is it ok to sch? Thx

## 2012-03-13 NOTE — Telephone Encounter (Signed)
Left mess for patient to call back.  

## 2012-03-15 NOTE — Telephone Encounter (Signed)
Pt informed . Please enter referral 

## 2012-03-17 NOTE — Telephone Encounter (Signed)
Will ref to Urol Thx

## 2012-08-27 HISTORY — PX: CATARACT EXTRACTION: SUR2

## 2012-09-02 ENCOUNTER — Encounter: Payer: Self-pay | Admitting: Internal Medicine

## 2012-09-02 ENCOUNTER — Ambulatory Visit (INDEPENDENT_AMBULATORY_CARE_PROVIDER_SITE_OTHER): Payer: Medicare Other | Admitting: Internal Medicine

## 2012-09-02 ENCOUNTER — Other Ambulatory Visit (INDEPENDENT_AMBULATORY_CARE_PROVIDER_SITE_OTHER): Payer: Medicare Other

## 2012-09-02 VITALS — BP 130/88 | HR 72 | Temp 98.1°F | Resp 16 | Ht 67.5 in | Wt 165.0 lb

## 2012-09-02 DIAGNOSIS — R972 Elevated prostate specific antigen [PSA]: Secondary | ICD-10-CM

## 2012-09-02 DIAGNOSIS — Z0000 Encounter for general adult medical examination without abnormal findings: Secondary | ICD-10-CM

## 2012-09-02 DIAGNOSIS — Z860100 Personal history of colon polyps, unspecified: Secondary | ICD-10-CM

## 2012-09-02 DIAGNOSIS — I10 Essential (primary) hypertension: Secondary | ICD-10-CM

## 2012-09-02 LAB — LIPID PANEL
Cholesterol: 171 mg/dL (ref 0–200)
HDL: 48.4 mg/dL (ref >39.00)
Triglycerides: 63 mg/dL (ref 0.0–149.0)
VLDL: 12.6 mg/dL (ref 0.0–40.0)

## 2012-09-02 LAB — CBC WITH DIFFERENTIAL/PLATELET
Eosinophils Relative: 2.3 % (ref 0.0–5.0)
HCT: 41.7 % (ref 39.0–52.0)
Hemoglobin: 13.9 g/dL (ref 13.0–17.0)
Lymphocytes Relative: 20.4 % (ref 12.0–46.0)
Lymphs Abs: 1 10*3/uL (ref 0.7–4.0)
Monocytes Relative: 8.7 % (ref 3.0–12.0)
Neutro Abs: 3.4 10*3/uL (ref 1.4–7.7)
WBC: 5.1 10*3/uL (ref 4.5–10.5)

## 2012-09-02 LAB — TSH: TSH: 0.62 u[IU]/mL (ref 0.35–5.50)

## 2012-09-02 LAB — BASIC METABOLIC PANEL
Calcium: 9.1 mg/dL (ref 8.4–10.5)
GFR: 92.56 mL/min (ref >60.00)
Potassium: 4 mEq/L (ref 3.5–5.1)
Sodium: 138 mEq/L (ref 135–145)

## 2012-09-02 LAB — URINALYSIS
Hgb urine dipstick: NEGATIVE
Total Protein, Urine: NEGATIVE
Urine Glucose: NEGATIVE
pH: 6.5 (ref 5.0–8.0)

## 2012-09-02 LAB — HEPATIC FUNCTION PANEL
ALT: 21 U/L (ref 0–53)
Albumin: 4 g/dL (ref 3.5–5.2)
Total Protein: 6.9 g/dL (ref 6.0–8.3)

## 2012-09-02 NOTE — Progress Notes (Signed)
Patient ID: Peter Wood, male   DOB: December 28, 1934, 77 y.o.   MRN: [id number]  Subjective:    Patient ID: Peter Wood, male    DOB: 08/21/1935, 77 y.o.   MRN: [id number]  HPI The patient is here for a wellness exam. The patient has been doing well overall without major physical or psychological issues going on lately. He had a prostate bx by Dr Brunilda Payor.  BP is ok at home  Review of Systems  Constitutional: Negative for chills, appetite change, fatigue and unexpected weight change.  HENT: Negative for hearing loss, nosebleeds, congestion, sore throat, sneezing, trouble swallowing and neck pain.   Eyes: Negative for itching and visual disturbance.  Respiratory: Negative for cough.   Cardiovascular: Negative for chest pain, palpitations and leg swelling.  Gastrointestinal: Negative for nausea, diarrhea, blood in stool and abdominal distention.  Genitourinary: Negative for frequency and hematuria.  Musculoskeletal: Negative for back pain, joint swelling, arthralgias and gait problem.  Skin: Negative for color change, rash and wound.  Neurological: Negative for dizziness, tremors, facial asymmetry, speech difficulty, weakness and numbness.  Psychiatric/Behavioral: Negative for suicidal ideas, sleep disturbance, dysphoric mood and agitation. The patient is not nervous/anxious.        Objective:   Physical Exam  Constitutional: He is oriented to person, place, and time. He appears well-developed and well-nourished. No distress.  HENT:  Mouth/Throat: Oropharynx is clear and moist.  Eyes: Conjunctivae normal are normal. Pupils are equal, round, and reactive to light.  Neck: Normal range of motion. No JVD present. No thyromegaly present.  Cardiovascular: Normal rate, regular rhythm and intact distal pulses.  Exam reveals no gallop and no friction rub.   Murmur (1-2/6 syst) heard. Pulmonary/Chest: Effort normal and breath sounds normal. No respiratory distress. He has no wheezes. He has no  rales. He exhibits no tenderness.  Abdominal: Soft. Bowel sounds are normal. He exhibits no distension and no mass. There is no tenderness. There is no rebound and no guarding.  Genitourinary: Rectum normal, prostate normal and penis normal. Guaiac negative stool. No penile tenderness.  Musculoskeletal: Normal range of motion. He exhibits no edema and no tenderness.  Lymphadenopathy:    He has no cervical adenopathy.  Neurological: He is alert and oriented to person, place, and time. He has normal reflexes. No cranial nerve deficit. He exhibits normal muscle tone. Coordination normal.  Skin: Skin is warm and dry. No rash noted.  Psychiatric: He has a normal mood and affect. His behavior is normal. Judgment and thought content normal.          Assessment & Plan:    He declined all shots

## 2012-09-02 NOTE — Assessment & Plan Note (Signed)
Up to date colon

## 2012-09-02 NOTE — Assessment & Plan Note (Addendum)
The patient is here for annual Medicare wellness examination and management of other chronic and acute problems.   The risk factors are reflected in the social history.  The roster of all physicians providing medical care to patient - is listed in the Snapshot section of the chart.  Activities of daily living:  The patient is 100% inedpendent in all ADLs: dressing, toileting, feeding as well as independent mobility  Home safety : The patient has smoke detectors in the home. They wear seatbelts.No firearms at home ( firearms are present in the home, kept in a safe fashion). There is no violence in the home.   There is no risks for hepatitis, STDs or HIV. There is no   history of blood transfusion. They have no travel history to infectious disease endemic areas of the world.  The patient has (has not) seen their dentist in the last six month. They have (not) seen their eye doctor in the last year. They deny (admit to) any hearing difficulty and have not had audiologic testing in the last year.  They do not  have excessive sun exposure. Discussed the need for sun protection: hats, long sleeves and use of sunscreen if there is significant sun exposure.   Diet: the importance of a healthy diet is discussed. They do have a healthy (unhealthy-high fat/fast food) diet.  The patient has a regular exercise program: 7 per week - walking, springs.  The benefits of regular aerobic exercise were discussed.  Depression screen: there are no signs or vegative symptoms of depression- irritability, change in appetite, anhedonia, sadness/tearfullness.  Cognitive assessment: the patient manages all their financial and personal affairs and is actively engaged. They could relate day,date,year and events; recalled 3/3 objects at 3 minutes; performed clock-face test normally.  The following portions of the patient's history were reviewed and updated as appropriate: allergies, current medications, past family history,  past medical history,  past surgical history, past social history  and problem list.  Vision, hearing, body mass index were assessed and reviewed.   During the course of the visit the patient was educated and counseled about appropriate screening and preventive services including : fall prevention , diabetes screening, nutrition counseling, colorectal cancer screening, and recommended immunizations.  He had an EKG 2 wks ago

## 2012-09-02 NOTE — Assessment & Plan Note (Signed)
Continue with current prescription therapy as reflected on the Med list.  

## 2012-09-02 NOTE — Assessment & Plan Note (Addendum)
S/p bx 2013 Dr Brunilda Payor - cancer cells were found

## 2012-11-27 ENCOUNTER — Other Ambulatory Visit: Payer: Self-pay | Admitting: Internal Medicine

## 2012-12-03 ENCOUNTER — Other Ambulatory Visit: Payer: Self-pay | Admitting: Internal Medicine

## 2012-12-06 ENCOUNTER — Other Ambulatory Visit: Payer: Self-pay | Admitting: Internal Medicine

## 2012-12-23 ENCOUNTER — Other Ambulatory Visit: Payer: Self-pay | Admitting: Internal Medicine

## 2012-12-23 ENCOUNTER — Other Ambulatory Visit: Payer: Self-pay | Admitting: *Deleted

## 2012-12-23 MED ORDER — AMLODIPINE BESYLATE 5 MG PO TABS: ORAL_TABLET | ORAL | Status: DC

## 2012-12-23 NOTE — Telephone Encounter (Signed)
Pt needs refill of Amlodipine sent to [facility]. Pt informed rx sent via VM and to callback with any questions/concerns.

## 2013-02-12 ENCOUNTER — Encounter: Payer: Self-pay | Admitting: Internal Medicine

## 2013-02-12 ENCOUNTER — Ambulatory Visit (INDEPENDENT_AMBULATORY_CARE_PROVIDER_SITE_OTHER): Payer: Medicare Other | Admitting: Internal Medicine

## 2013-02-12 VITALS — BP 140/80 | HR 80 | Temp 99.4°F | Resp 16 | Wt 163.0 lb

## 2013-02-12 DIAGNOSIS — I10 Essential (primary) hypertension: Secondary | ICD-10-CM

## 2013-02-12 DIAGNOSIS — R21 Rash and other nonspecific skin eruption: Secondary | ICD-10-CM

## 2013-02-12 MED ORDER — TRIAMCINOLONE ACETONIDE 0.5 % EX CREA: TOPICAL_CREAM | Freq: Three times a day (TID) | CUTANEOUS | Status: DC

## 2013-02-12 MED ORDER — VITAMIN D 1000 UNITS PO TABS: 1000.0000 [IU] | ORAL_TABLET | Freq: Every day | ORAL | Status: AC

## 2013-02-12 NOTE — Progress Notes (Signed)
   Subjective:     Rash This is a new problem. The current episode started in the past 7 days. The problem has been gradually improving since onset. The affected locations include the left arm, right arm, right hand and left hand. The rash is characterized by scaling and redness. He was exposed to chemicals (clorox). Pertinent negatives include no congestion, cough, diarrhea, fatigue or sore throat. Past treatments include nothing. The treatment provided no relief.   The patient is here for a wellness exam. The patient has been doing well overall without major physical or psychological issues going on lately. He had a prostate bx by Dr Brunilda Payor.  BP is ok at home  Review of Systems  Constitutional: Negative for chills, appetite change, fatigue and unexpected weight change.  HENT: Negative for hearing loss, nosebleeds, congestion, sore throat, sneezing, trouble swallowing and neck pain.   Eyes: Negative for itching and visual disturbance.  Respiratory: Negative for cough.   Cardiovascular: Negative for chest pain, palpitations and leg swelling.  Gastrointestinal: Negative for nausea, diarrhea, blood in stool and abdominal distention.  Genitourinary: Negative for frequency and hematuria.  Musculoskeletal: Negative for back pain, joint swelling, arthralgias and gait problem.  Skin: Positive for rash. Negative for color change and wound.  Neurological: Negative for dizziness, tremors, facial asymmetry, speech difficulty, weakness and numbness.  Psychiatric/Behavioral: Negative for suicidal ideas, sleep disturbance, dysphoric mood and agitation. The patient is not nervous/anxious.        Objective:   Physical Exam  Constitutional: He is oriented to person, place, and time. He appears well-developed and well-nourished. No distress.  HENT:  Mouth/Throat: Oropharynx is clear and moist.  Eyes: Conjunctivae are normal. Pupils are equal, round, and reactive to light.  Neck: Normal range of motion.  No JVD present. No thyromegaly present.  Cardiovascular: Normal rate, regular rhythm and intact distal pulses.  Exam reveals no gallop and no friction rub.   Murmur (1-2/6 syst) heard. Pulmonary/Chest: Effort normal and breath sounds normal. No respiratory distress. He has no wheezes. He has no rales. He exhibits no tenderness.  Abdominal: Soft. Bowel sounds are normal. He exhibits no distension and no mass. There is no tenderness. There is no rebound and no guarding.  Genitourinary: Rectum normal, prostate normal and penis normal. Guaiac negative stool. No penile tenderness.  Musculoskeletal: Normal range of motion. He exhibits no edema and no tenderness.  Lymphadenopathy:    He has no cervical adenopathy.  Neurological: He is alert and oriented to person, place, and time. He has normal reflexes. No cranial nerve deficit. He exhibits normal muscle tone. Coordination normal.  Skin: Skin is warm and dry. No rash noted.  Psychiatric: He has a normal mood and affect. His behavior is normal. Judgment and thought content normal.   Rash eryth papular on B forearms, hands       Assessment & Plan:

## 2013-02-12 NOTE — Assessment & Plan Note (Signed)
6/14 likely a photosensitivity rash due to Benazepril Triamc Rx tid If the rash re-occured - may need to switch hi BP Rx  

## 2013-02-12 NOTE — Assessment & Plan Note (Signed)
6/14 likely a photosensitivity rash due to Benazepril Triamc Rx tid If the rash re-occured - may need to switch hi BP Rx

## 2013-03-11 ENCOUNTER — Encounter: Payer: Self-pay | Admitting: Internal Medicine

## 2013-03-11 ENCOUNTER — Ambulatory Visit (INDEPENDENT_AMBULATORY_CARE_PROVIDER_SITE_OTHER): Payer: Medicare Other | Admitting: Internal Medicine

## 2013-03-11 VITALS — BP 130/82 | HR 76 | Temp 97.1°F | Resp 16 | Wt 160.0 lb

## 2013-03-11 DIAGNOSIS — I10 Essential (primary) hypertension: Secondary | ICD-10-CM

## 2013-03-11 DIAGNOSIS — R21 Rash and other nonspecific skin eruption: Secondary | ICD-10-CM

## 2013-03-11 NOTE — Assessment & Plan Note (Signed)
Resolved - he resumed Benazepril - doing ok

## 2013-03-11 NOTE — Assessment & Plan Note (Signed)
Back on Benazepril - no rash

## 2013-03-11 NOTE — Progress Notes (Signed)
Patient ID: Peter Wood, male   DOB: 1935/03/21, 78 y.o.   MRN: [id number]   Subjective:     HPI The patient is here for a rash an HTN f/u. Rash has resolved The patient has been doing well overall without major physical or psychological issues going on lately. He had a prostate bx by Dr Brunilda Payor.  BP is ok at home  Review of Systems  Constitutional: Negative for chills, appetite change and unexpected weight change.  HENT: Negative for hearing loss, nosebleeds, sneezing, trouble swallowing and neck pain.   Eyes: Negative for itching and visual disturbance.  Cardiovascular: Negative for chest pain, palpitations and leg swelling.  Gastrointestinal: Negative for nausea, blood in stool and abdominal distention.  Genitourinary: Negative for frequency and hematuria.  Musculoskeletal: Negative for back pain, joint swelling, arthralgias and gait problem.  Skin: Negative for color change and wound.  Neurological: Negative for dizziness, tremors, facial asymmetry, speech difficulty, weakness and numbness.  Psychiatric/Behavioral: Negative for suicidal ideas, sleep disturbance, dysphoric mood and agitation. The patient is not nervous/anxious.        Objective:   Physical Exam  Constitutional: He is oriented to person, place, and time. He appears well-developed and well-nourished. No distress.  HENT:  Mouth/Throat: Oropharynx is clear and moist.  Eyes: Conjunctivae are normal. Pupils are equal, round, and reactive to light.  Neck: Normal range of motion. No JVD present. No thyromegaly present.  Cardiovascular: Normal rate, regular rhythm and intact distal pulses.  Exam reveals no gallop and no friction rub.   Murmur (1-2/6 syst) heard. Pulmonary/Chest: Effort normal and breath sounds normal. No respiratory distress. He has no wheezes. He has no rales. He exhibits no tenderness.  Abdominal: Soft. Bowel sounds are normal. He exhibits no distension and no mass. There is no tenderness. There is  no rebound and no guarding.  Genitourinary: Rectum normal, prostate normal and penis normal. Guaiac negative stool. No penile tenderness.  Musculoskeletal: Normal range of motion. He exhibits no edema and no tenderness.  Lymphadenopathy:    He has no cervical adenopathy.  Neurological: He is alert and oriented to person, place, and time. He has normal reflexes. No cranial nerve deficit. He exhibits normal muscle tone. Coordination normal.  Skin: Skin is warm and dry. No rash noted.  Psychiatric: He has a normal mood and affect. His behavior is normal. Judgment and thought content normal.         Assessment & Plan:

## 2013-07-30 ENCOUNTER — Other Ambulatory Visit (HOSPITAL_COMMUNITY): Payer: Self-pay | Admitting: Urology

## 2013-07-30 DIAGNOSIS — C61 Malignant neoplasm of prostate: Secondary | ICD-10-CM

## 2013-08-21 ENCOUNTER — Ambulatory Visit (HOSPITAL_COMMUNITY)
Admission: RE | Admit: 2013-08-21 | Discharge: 2013-08-21 | Disposition: A | Discharge status: Home / Self Care | Payer: Medicare Other | Source: Ambulatory Visit | Attending: Urology | Admitting: Urology

## 2013-08-21 DIAGNOSIS — R972 Elevated prostate specific antigen [PSA]: Secondary | ICD-10-CM | POA: Insufficient documentation

## 2013-08-21 DIAGNOSIS — C61 Malignant neoplasm of prostate: Secondary | ICD-10-CM | POA: Insufficient documentation

## 2013-08-21 LAB — CREATININE, SERUM
GFR calc Af Amer: 76 mL/min — ABNORMAL LOW (ref >90)
GFR calc non Af Amer: 65 mL/min — ABNORMAL LOW (ref >90)

## 2013-08-21 MED ORDER — GADOBENATE DIMEGLUMINE 529 MG/ML IV SOLN
14.0000 mL | Freq: Once | INTRAVENOUS | Status: AC | PRN
  Administered 2013-08-21: 14 mL via INTRAVENOUS

## 2013-09-02 ENCOUNTER — Encounter: Payer: Medicare Other | Admitting: Internal Medicine

## 2013-09-11 DIAGNOSIS — C61 Malignant neoplasm of prostate: Secondary | ICD-10-CM

## 2013-09-11 HISTORY — DX: Malignant neoplasm of prostate: C61

## 2013-09-16 ENCOUNTER — Ambulatory Visit (INDEPENDENT_AMBULATORY_CARE_PROVIDER_SITE_OTHER): Payer: Medicare Other | Admitting: Internal Medicine

## 2013-09-16 ENCOUNTER — Telehealth: Payer: Self-pay | Admitting: *Deleted

## 2013-09-16 ENCOUNTER — Encounter: Payer: Self-pay | Admitting: Internal Medicine

## 2013-09-16 VITALS — BP 158/92 | HR 72 | Temp 98.4°F | Resp 16 | Ht 67.5 in | Wt 163.0 lb

## 2013-09-16 DIAGNOSIS — R21 Rash and other nonspecific skin eruption: Secondary | ICD-10-CM

## 2013-09-16 DIAGNOSIS — C61 Malignant neoplasm of prostate: Secondary | ICD-10-CM

## 2013-09-16 DIAGNOSIS — I10 Essential (primary) hypertension: Secondary | ICD-10-CM

## 2013-09-16 MED ORDER — LOSARTAN POTASSIUM 100 MG PO TABS: 100.0000 mg | ORAL_TABLET | Freq: Every day | ORAL | Status: DC

## 2013-09-16 NOTE — Assessment & Plan Note (Addendum)
1/15 BP is nl at home Continue with current prescription therapy as reflected on the Med list. D/c Benazepril Start Losartan

## 2013-09-16 NOTE — Assessment & Plan Note (Addendum)
?  contact allergy - hands only Derm consult  D/c Benazepril Start Losartan

## 2013-09-16 NOTE — Assessment & Plan Note (Addendum)
He had a prostate bx (Ca), MRI by Dr Janice Norrie. External XRT was offered They are planning to see someone at [facility] for a 2nd opinion

## 2013-09-16 NOTE — Telephone Encounter (Signed)
Peter Wood with [facility] called regarding this patient and a form that was faxed to [facility].  Is wishing to confirm receipt of fax and the form completed & returned.   CB# [phone] option 1 Fax # [phone]

## 2013-09-16 NOTE — Progress Notes (Signed)
   Subjective:     HPI The patient is here for a rash an HTN f/u. Rash has resolved then came back - hands only The patient has been doing well overall without major physical or psychological issues going on lately. He had a prostate bx (Ca), MRI by Dr Janice Norrie. External XRT was offered  BP is ok at home  Review of Systems  Constitutional: Negative for chills, appetite change and unexpected weight change.  HENT: Negative for hearing loss, nosebleeds, sneezing and trouble swallowing.   Eyes: Negative for itching and visual disturbance.  Cardiovascular: Negative for chest pain, palpitations and leg swelling.  Gastrointestinal: Negative for nausea, blood in stool and abdominal distention.  Genitourinary: Negative for frequency and hematuria.  Musculoskeletal: Negative for arthralgias, back pain, gait problem, joint swelling and neck pain.  Skin: Negative for color change and wound.  Neurological: Negative for dizziness, tremors, facial asymmetry, speech difficulty, weakness and numbness.  Psychiatric/Behavioral: Negative for suicidal ideas, sleep disturbance, dysphoric mood and agitation. The patient is not nervous/anxious.        Objective:   Physical Exam  Constitutional: He is oriented to person, place, and time. He appears well-developed and well-nourished. No distress.  HENT:  Mouth/Throat: Oropharynx is clear and moist.  Eyes: Conjunctivae are normal. Pupils are equal, round, and reactive to light.  Neck: Normal range of motion. No JVD present. No thyromegaly present.  Cardiovascular: Normal rate, regular rhythm and intact distal pulses.  Exam reveals no gallop and no friction rub.   Murmur (1-2/6 syst) heard. Pulmonary/Chest: Effort normal and breath sounds normal. No respiratory distress. He has no wheezes. He has no rales. He exhibits no tenderness.  Abdominal: Soft. Bowel sounds are normal. He exhibits no distension and no mass. There is no tenderness. There is no rebound and no  guarding.  Genitourinary: Rectum normal, prostate normal and penis normal. Guaiac negative stool. No penile tenderness.  Musculoskeletal: Normal range of motion. He exhibits no edema and no tenderness.  Lymphadenopathy:    He has no cervical adenopathy.  Neurological: He is alert and oriented to person, place, and time. He has normal reflexes. No cranial nerve deficit. He exhibits normal muscle tone. Coordination normal.  Skin: Skin is warm and dry.  Psychiatric: He has a normal mood and affect. His behavior is normal. Judgment and thought content normal.  Hyperpigmented over fingers       Assessment & Plan:

## 2013-09-16 NOTE — Progress Notes (Signed)
Pre visit review using our clinic review tool, if applicable. No additional management support is needed unless otherwise documented below in the visit note. 

## 2013-09-17 NOTE — Telephone Encounter (Signed)
Form received from [place]- will give to MD for completion. Kelly at [employer] informed.  Left mess for patient to call back to see if he wants form completed.

## 2013-10-03 NOTE — Telephone Encounter (Signed)
No returned call x 09/17/13, closing phone note

## 2013-10-10 ENCOUNTER — Telehealth: Payer: Self-pay | Admitting: Internal Medicine

## 2013-10-10 NOTE — Telephone Encounter (Signed)
Relevant patient education mailed to patient.  

## 2013-11-21 ENCOUNTER — Other Ambulatory Visit: Payer: Self-pay | Admitting: Internal Medicine

## 2013-12-15 ENCOUNTER — Ambulatory Visit: Payer: Medicare Other | Admitting: Internal Medicine

## 2013-12-16 ENCOUNTER — Ambulatory Visit (INDEPENDENT_AMBULATORY_CARE_PROVIDER_SITE_OTHER): Payer: Medicare Other | Admitting: Internal Medicine

## 2013-12-16 ENCOUNTER — Other Ambulatory Visit (INDEPENDENT_AMBULATORY_CARE_PROVIDER_SITE_OTHER): Payer: Medicare Other

## 2013-12-16 ENCOUNTER — Encounter: Payer: Self-pay | Admitting: Internal Medicine

## 2013-12-16 VITALS — BP 128/80 | HR 72 | Temp 98.7°F | Wt 163.0 lb

## 2013-12-16 DIAGNOSIS — I10 Essential (primary) hypertension: Secondary | ICD-10-CM

## 2013-12-16 DIAGNOSIS — R21 Rash and other nonspecific skin eruption: Secondary | ICD-10-CM

## 2013-12-16 DIAGNOSIS — C61 Malignant neoplasm of prostate: Secondary | ICD-10-CM

## 2013-12-16 LAB — HEPATIC FUNCTION PANEL
ALT: 31 U/L (ref 0–53)
AST: 29 U/L (ref 0–37)
Albumin: 3.7 g/dL (ref 3.5–5.2)
Alkaline Phosphatase: 50 U/L (ref 39–117)
BILIRUBIN TOTAL: 0.9 mg/dL (ref 0.3–1.2)
Bilirubin, Direct: 0.1 mg/dL (ref 0.0–0.3)
Total Protein: 6.4 g/dL (ref 6.0–8.3)

## 2013-12-16 LAB — LIPID PANEL
CHOLESTEROL: 172 mg/dL (ref 0–200)
HDL: 49.4 mg/dL (ref >39.00)
LDL CALC: 100 mg/dL — AB (ref 0–99)
TRIGLYCERIDES: 111 mg/dL (ref 0.0–149.0)
Total CHOL/HDL Ratio: 3
VLDL: 22.2 mg/dL (ref 0.0–40.0)

## 2013-12-16 LAB — URINALYSIS
BILIRUBIN URINE: NEGATIVE
HGB URINE DIPSTICK: NEGATIVE
KETONES UR: NEGATIVE
Leukocytes, UA: NEGATIVE
NITRITE: NEGATIVE
Specific Gravity, Urine: 1.025 (ref 1.000–1.030)
Total Protein, Urine: NEGATIVE
Urine Glucose: NEGATIVE
Urobilinogen, UA: 0.2 (ref 0.0–1.0)
pH: 6 (ref 5.0–8.0)

## 2013-12-16 LAB — BASIC METABOLIC PANEL
BUN: 15 mg/dL (ref 6–23)
CO2: 30 mEq/L (ref 19–32)
CREATININE: 1.1 mg/dL (ref 0.4–1.5)
Calcium: 9 mg/dL (ref 8.4–10.5)
Chloride: 103 mEq/L (ref 96–112)
GFR: 81.04 mL/min (ref >60.00)
GLUCOSE: 96 mg/dL (ref 70–99)
Potassium: 4 mEq/L (ref 3.5–5.1)
Sodium: 139 mEq/L (ref 135–145)

## 2013-12-16 LAB — CBC WITH DIFFERENTIAL/PLATELET
BASOS ABS: 0.1 10*3/uL (ref 0.0–0.1)
Basophils Relative: 1.1 % (ref 0.0–3.0)
Eosinophils Absolute: 0.1 10*3/uL (ref 0.0–0.7)
Eosinophils Relative: 2.7 % (ref 0.0–5.0)
HEMATOCRIT: 39.7 % (ref 39.0–52.0)
Hemoglobin: 13.2 g/dL (ref 13.0–17.0)
Lymphocytes Relative: 25.4 % (ref 12.0–46.0)
Lymphs Abs: 1.4 10*3/uL (ref 0.7–4.0)
MCHC: 33.2 g/dL (ref 30.0–36.0)
MCV: 96.8 fl (ref 78.0–100.0)
MONOS PCT: 9.3 % (ref 3.0–12.0)
Monocytes Absolute: 0.5 10*3/uL (ref 0.1–1.0)
NEUTROS ABS: 3.4 10*3/uL (ref 1.4–7.7)
Neutrophils Relative %: 61.5 % (ref 43.0–77.0)
PLATELETS: 221 10*3/uL (ref 150.0–400.0)
RBC: 4.1 Mil/uL — ABNORMAL LOW (ref 4.22–5.81)
RDW: 13.2 % (ref 11.5–14.6)
WBC: 5.5 10*3/uL (ref 4.5–10.5)

## 2013-12-16 LAB — TSH: TSH: 0.61 u[IU]/mL (ref 0.35–5.50)

## 2013-12-16 NOTE — Assessment & Plan Note (Signed)
Resolved

## 2013-12-16 NOTE — Assessment & Plan Note (Signed)
Continue with current prescription therapy as reflected on the Med list.  

## 2013-12-16 NOTE — Progress Notes (Signed)
   Subjective:     HPI The patient is here for a rash an HTN f/u.  Rash has resolved  He had a prostate bx (Ca), MRI by Dr Janice Norrie.   External XRT at [facility] - just started (x50) in 4/15.   BP is ok at home  BP Readings from Last 3 Encounters:  12/16/13 128/80  09/16/13 158/92  03/11/13 130/82     Review of Systems  Constitutional: Negative for chills, appetite change and unexpected weight change.  HENT: Negative for hearing loss, nosebleeds, sneezing and trouble swallowing.   Eyes: Negative for itching and visual disturbance.  Cardiovascular: Negative for chest pain, palpitations and leg swelling.  Gastrointestinal: Negative for nausea, blood in stool and abdominal distention.  Genitourinary: Negative for frequency and hematuria.  Musculoskeletal: Negative for arthralgias, back pain, gait problem, joint swelling and neck pain.  Skin: Negative for color change and wound.  Neurological: Negative for dizziness, tremors, facial asymmetry, speech difficulty, weakness and numbness.  Psychiatric/Behavioral: Negative for suicidal ideas, sleep disturbance, dysphoric mood and agitation. The patient is not nervous/anxious.        Objective:   Physical Exam  Constitutional: He is oriented to person, place, and time. He appears well-developed and well-nourished. No distress.  HENT:  Mouth/Throat: Oropharynx is clear and moist.  Eyes: Conjunctivae are normal. Pupils are equal, round, and reactive to light.  Neck: Normal range of motion. No JVD present. No thyromegaly present.  Cardiovascular: Normal rate, regular rhythm and intact distal pulses.  Exam reveals no gallop and no friction rub.   Murmur (1-2/6 syst) heard. Pulmonary/Chest: Effort normal and breath sounds normal. No respiratory distress. He has no wheezes. He has no rales. He exhibits no tenderness.  Abdominal: Soft. Bowel sounds are normal. He exhibits no distension and no mass. There is no tenderness. There is no rebound and no  guarding.  Genitourinary: Rectum normal, prostate normal and penis normal. Guaiac negative stool. No penile tenderness.  Musculoskeletal: Normal range of motion. He exhibits no edema and no tenderness.  Lymphadenopathy:    He has no cervical adenopathy.  Neurological: He is alert and oriented to person, place, and time. He has normal reflexes. No cranial nerve deficit. He exhibits normal muscle tone. Coordination normal.  Skin: Skin is warm and dry.  Psychiatric: He has a normal mood and affect. His behavior is normal. Judgment and thought content normal.  Hyperpigmented over fingers       Assessment & Plan:

## 2013-12-16 NOTE — Progress Notes (Signed)
Pre visit review using our clinic review tool, if applicable. No additional management support is needed unless otherwise documented below in the visit note. 

## 2013-12-16 NOTE — Assessment & Plan Note (Signed)
External XRT at [facility] - just started (x50) in 4/15.

## 2014-01-22 ENCOUNTER — Other Ambulatory Visit: Payer: Self-pay | Admitting: Internal Medicine

## 2014-04-27 ENCOUNTER — Telehealth: Payer: Self-pay

## 2014-04-27 NOTE — Telephone Encounter (Signed)
Pt called back and stated that the AWV was completed at home.

## 2014-04-27 NOTE — Telephone Encounter (Signed)
LVM for pt to call back in regards to AWV.

## 2014-06-19 ENCOUNTER — Encounter: Payer: Self-pay | Admitting: Internal Medicine

## 2014-06-19 ENCOUNTER — Other Ambulatory Visit (INDEPENDENT_AMBULATORY_CARE_PROVIDER_SITE_OTHER): Payer: Medicare Other

## 2014-06-19 ENCOUNTER — Ambulatory Visit (INDEPENDENT_AMBULATORY_CARE_PROVIDER_SITE_OTHER): Payer: Medicare Other | Admitting: Internal Medicine

## 2014-06-19 VITALS — BP 130/85 | HR 76 | Temp 99.1°F | Resp 16 | Ht 67.0 in | Wt 163.0 lb

## 2014-06-19 DIAGNOSIS — N529 Male erectile dysfunction, unspecified: Secondary | ICD-10-CM

## 2014-06-19 DIAGNOSIS — Z0000 Encounter for general adult medical examination without abnormal findings: Secondary | ICD-10-CM

## 2014-06-19 DIAGNOSIS — Z23 Encounter for immunization: Secondary | ICD-10-CM

## 2014-06-19 DIAGNOSIS — C61 Malignant neoplasm of prostate: Secondary | ICD-10-CM

## 2014-06-19 DIAGNOSIS — R972 Elevated prostate specific antigen [PSA]: Secondary | ICD-10-CM

## 2014-06-19 DIAGNOSIS — I10 Essential (primary) hypertension: Secondary | ICD-10-CM

## 2014-06-19 DIAGNOSIS — N528 Other male erectile dysfunction: Secondary | ICD-10-CM

## 2014-06-19 LAB — URINALYSIS
BILIRUBIN URINE: NEGATIVE
HGB URINE DIPSTICK: NEGATIVE
Ketones, ur: NEGATIVE
LEUKOCYTES UA: NEGATIVE
NITRITE: NEGATIVE
Specific Gravity, Urine: 1.015 (ref 1.000–1.030)
Total Protein, Urine: NEGATIVE
Urine Glucose: NEGATIVE
Urobilinogen, UA: 0.2 (ref 0.0–1.0)
pH: 7 (ref 5.0–8.0)

## 2014-06-19 LAB — HEPATIC FUNCTION PANEL
ALT: 25 U/L (ref 0–53)
AST: 24 U/L (ref 0–37)
Albumin: 3.7 g/dL (ref 3.5–5.2)
Alkaline Phosphatase: 56 U/L (ref 39–117)
BILIRUBIN DIRECT: 0.2 mg/dL (ref 0.0–0.3)
BILIRUBIN TOTAL: 0.9 mg/dL (ref 0.2–1.2)
Total Protein: 7.5 g/dL (ref 6.0–8.3)

## 2014-06-19 LAB — CBC WITH DIFFERENTIAL/PLATELET
BASOS PCT: 0.7 % (ref 0.0–3.0)
Basophils Absolute: 0 10*3/uL (ref 0.0–0.1)
EOS ABS: 0.1 10*3/uL (ref 0.0–0.7)
Eosinophils Relative: 2.9 % (ref 0.0–5.0)
HCT: 42.1 % (ref 39.0–52.0)
HEMOGLOBIN: 13.6 g/dL (ref 13.0–17.0)
Lymphocytes Relative: 28.3 % (ref 12.0–46.0)
Lymphs Abs: 1.2 10*3/uL (ref 0.7–4.0)
MCHC: 32.4 g/dL (ref 30.0–36.0)
MCV: 96.6 fl (ref 78.0–100.0)
Monocytes Absolute: 0.4 10*3/uL (ref 0.1–1.0)
Monocytes Relative: 9.7 % (ref 3.0–12.0)
NEUTROS PCT: 58.4 % (ref 43.0–77.0)
Neutro Abs: 2.4 10*3/uL (ref 1.4–7.7)
Platelets: 210 10*3/uL (ref 150.0–400.0)
RBC: 4.35 Mil/uL (ref 4.22–5.81)
RDW: 13.3 % (ref 11.5–15.5)
WBC: 4.1 10*3/uL (ref 4.0–10.5)

## 2014-06-19 LAB — BASIC METABOLIC PANEL
BUN: 15 mg/dL (ref 6–23)
CHLORIDE: 101 meq/L (ref 96–112)
CO2: 28 meq/L (ref 19–32)
CREATININE: 1 mg/dL (ref 0.4–1.5)
Calcium: 9.1 mg/dL (ref 8.4–10.5)
GFR: 93.19 mL/min (ref >60.00)
GLUCOSE: 79 mg/dL (ref 70–99)
Potassium: 4.1 mEq/L (ref 3.5–5.1)
Sodium: 134 mEq/L — ABNORMAL LOW (ref 135–145)

## 2014-06-19 LAB — LIPID PANEL
CHOL/HDL RATIO: 4
Cholesterol: 186 mg/dL (ref 0–200)
HDL: 43.9 mg/dL (ref >39.00)
LDL CALC: 124 mg/dL — AB (ref 0–99)
NonHDL: 142.1
Triglycerides: 90 mg/dL (ref 0.0–149.0)
VLDL: 18 mg/dL (ref 0.0–40.0)

## 2014-06-19 LAB — TSH: TSH: 0.62 u[IU]/mL (ref 0.35–4.50)

## 2014-06-19 MED ORDER — VARDENAFIL HCL 20 MG PO TABS: 20.0000 mg | ORAL_TABLET | Freq: Every day | ORAL | Status: DC | PRN

## 2014-06-19 NOTE — Assessment & Plan Note (Signed)
S/p External XRT at [facility] - 2015.  Will try Levitra

## 2014-06-19 NOTE — Progress Notes (Signed)
   Subjective:     HPI The patient is here for a wellness exam. The patient has been doing well overall without major physical or psychological issues going on lately. He had a prostate bx by Dr Janice Norrie.  BP is ok at home. C/o ED post-XRT at [facility] - summer 2015  Wt Readings from Last 3 Encounters:  06/19/14 163 lb (73.936 kg)  12/16/13 163 lb (73.936 kg)  09/16/13 163 lb (73.936 kg)   BP Readings from Last 3 Encounters:  06/19/14 130/85  12/16/13 128/80  09/16/13 158/92      Review of Systems  Constitutional: Negative for chills, appetite change, fatigue and unexpected weight change.  HENT: Negative for congestion, hearing loss, nosebleeds, sneezing, sore throat and trouble swallowing.   Eyes: Negative for itching and visual disturbance.  Respiratory: Negative for cough.   Cardiovascular: Negative for chest pain, palpitations and leg swelling.  Gastrointestinal: Negative for nausea, diarrhea, blood in stool and abdominal distention.  Genitourinary: Negative for frequency and hematuria.  Musculoskeletal: Negative for arthralgias, back pain, gait problem, joint swelling and neck pain.  Skin: Negative for color change, rash and wound.  Neurological: Negative for dizziness, tremors, facial asymmetry, speech difficulty, weakness and numbness.  Psychiatric/Behavioral: Negative for suicidal ideas, sleep disturbance, dysphoric mood and agitation. The patient is not nervous/anxious.        Objective:   Physical Exam  Constitutional: He is oriented to person, place, and time. He appears well-developed and well-nourished. No distress.  HENT:  Mouth/Throat: Oropharynx is clear and moist.  Eyes: Conjunctivae are normal. Pupils are equal, round, and reactive to light.  Neck: Normal range of motion. No JVD present. No thyromegaly present.  Cardiovascular: Normal rate, regular rhythm and intact distal pulses.  Exam reveals no gallop and no friction rub.   Murmur (1-2/6 syst)  heard. Pulmonary/Chest: Effort normal and breath sounds normal. No respiratory distress. He has no wheezes. He has no rales. He exhibits no tenderness.  Abdominal: Soft. Bowel sounds are normal. He exhibits no distension and no mass. There is no tenderness. There is no rebound and no guarding.  Genitourinary:  GEN/Rectal - per Urology  Musculoskeletal: Normal range of motion. He exhibits no edema and no tenderness.  Lymphadenopathy:    He has no cervical adenopathy.  Neurological: He is alert and oriented to person, place, and time. He has normal reflexes. No cranial nerve deficit. He exhibits normal muscle tone. Coordination normal.  Skin: Skin is warm and dry. No rash noted.  Psychiatric: He has a normal mood and affect. His behavior is normal. Judgment and thought content normal.  Looks younger  Lab Results  Component Value Date   WBC 5.5 12/16/2013   HGB 13.2 12/16/2013   HCT 39.7 12/16/2013   PLT 221.0 12/16/2013   GLUCOSE 96 12/16/2013   CHOL 172 12/16/2013   TRIG 111.0 12/16/2013   HDL 49.40 12/16/2013   LDLDIRECT 126.3 09/23/2007   LDLCALC 100* 12/16/2013   ALT 31 12/16/2013   AST 29 12/16/2013   NA 139 12/16/2013   K 4.0 12/16/2013   CL 103 12/16/2013   CREATININE 1.1 12/16/2013   BUN 15 12/16/2013   CO2 30 12/16/2013   TSH 0.61 12/16/2013   PSA 6.26* 03/11/2012   PSA 6.12* 03/11/2012          Assessment & Plan:    He declined all shots

## 2014-06-19 NOTE — Assessment & Plan Note (Signed)
2014  External XRT at [facility] - 2015.

## 2014-06-19 NOTE — Assessment & Plan Note (Addendum)
Doing well  External XRT at [facility] - 2015.

## 2014-06-19 NOTE — Progress Notes (Signed)
Pre visit review using our clinic review tool, if applicable. No additional management support is needed unless otherwise documented below in the visit note. 

## 2014-06-19 NOTE — Assessment & Plan Note (Signed)
Continue with current prescription therapy as reflected on the Med list.  

## 2014-06-19 NOTE — Patient Instructions (Signed)

## 2014-06-19 NOTE — Assessment & Plan Note (Addendum)

## 2014-08-05 ENCOUNTER — Encounter (INDEPENDENT_AMBULATORY_CARE_PROVIDER_SITE_OTHER): Payer: Medicare Other | Admitting: Ophthalmology

## 2014-08-05 DIAGNOSIS — H43813 Vitreous degeneration, bilateral: Secondary | ICD-10-CM

## 2014-08-05 DIAGNOSIS — H35033 Hypertensive retinopathy, bilateral: Secondary | ICD-10-CM

## 2014-08-05 DIAGNOSIS — H35371 Puckering of macula, right eye: Secondary | ICD-10-CM

## 2014-08-05 DIAGNOSIS — I10 Essential (primary) hypertension: Secondary | ICD-10-CM

## 2014-09-11 HISTORY — PX: HAND SURGERY: SHX662

## 2014-10-28 ENCOUNTER — Other Ambulatory Visit: Payer: Self-pay | Admitting: Internal Medicine

## 2014-10-30 ENCOUNTER — Other Ambulatory Visit: Payer: Self-pay | Admitting: Internal Medicine

## 2014-12-21 ENCOUNTER — Ambulatory Visit (INDEPENDENT_AMBULATORY_CARE_PROVIDER_SITE_OTHER): Payer: Medicare Other | Admitting: Internal Medicine

## 2014-12-21 ENCOUNTER — Other Ambulatory Visit (INDEPENDENT_AMBULATORY_CARE_PROVIDER_SITE_OTHER): Payer: Medicare Other

## 2014-12-21 ENCOUNTER — Encounter: Payer: Self-pay | Admitting: Internal Medicine

## 2014-12-21 VITALS — BP 138/80 | HR 60 | Wt 169.0 lb

## 2014-12-21 DIAGNOSIS — I10 Essential (primary) hypertension: Secondary | ICD-10-CM

## 2014-12-21 DIAGNOSIS — N529 Male erectile dysfunction, unspecified: Secondary | ICD-10-CM

## 2014-12-21 DIAGNOSIS — Z23 Encounter for immunization: Secondary | ICD-10-CM

## 2014-12-21 DIAGNOSIS — C61 Malignant neoplasm of prostate: Secondary | ICD-10-CM

## 2014-12-21 DIAGNOSIS — N528 Other male erectile dysfunction: Secondary | ICD-10-CM | POA: Diagnosis not present

## 2014-12-21 LAB — BASIC METABOLIC PANEL
BUN: 14 mg/dL (ref 6–23)
CHLORIDE: 100 meq/L (ref 96–112)
CO2: 30 mEq/L (ref 19–32)
CREATININE: 1.01 mg/dL (ref 0.40–1.50)
Calcium: 9.1 mg/dL (ref 8.4–10.5)
GFR: 92 mL/min (ref >60.00)
Glucose, Bld: 83 mg/dL (ref 70–99)
Potassium: 3.8 mEq/L (ref 3.5–5.1)
Sodium: 136 mEq/L (ref 135–145)

## 2014-12-21 NOTE — Progress Notes (Signed)
   Subjective:     HPI The patient is here for a HTN, ED, prostate ca f/u.  Rash has resolved  He had a prostate bx (Ca), MRI by Dr Janice Norrie.   External XRT at [facility] - (x50) in 4/15.   BP is ok at home  BP Readings from Last 3 Encounters:  12/21/14 138/98  06/19/14 130/85  12/16/13 128/80     Review of Systems  Constitutional: Negative for chills, appetite change and unexpected weight change.  HENT: Negative for hearing loss, nosebleeds, sneezing and trouble swallowing.   Eyes: Negative for itching and visual disturbance.  Cardiovascular: Negative for chest pain, palpitations and leg swelling.  Gastrointestinal: Negative for nausea, blood in stool and abdominal distention.  Genitourinary: Negative for frequency and hematuria.  Musculoskeletal: Negative for back pain, joint swelling, arthralgias, gait problem and neck pain.  Skin: Negative for color change and wound.  Neurological: Negative for dizziness, tremors, facial asymmetry, speech difficulty, weakness and numbness.  Psychiatric/Behavioral: Negative for suicidal ideas, sleep disturbance, dysphoric mood and agitation. The patient is not nervous/anxious.        Objective:   Physical Exam  Constitutional: He is oriented to person, place, and time. He appears well-developed and well-nourished. No distress.  HENT:  Mouth/Throat: Oropharynx is clear and moist.  Eyes: Conjunctivae are normal. Pupils are equal, round, and reactive to light.  Neck: Normal range of motion. No JVD present. No thyromegaly present.  Cardiovascular: Normal rate, regular rhythm and intact distal pulses.  Exam reveals no gallop and no friction rub.   Murmur (1-2/6 syst) heard. Pulmonary/Chest: Effort normal and breath sounds normal. No respiratory distress. He has no wheezes. He has no rales. He exhibits no tenderness.  Abdominal: Soft. Bowel sounds are normal. He exhibits no distension and no mass. There is no tenderness. There is no rebound and no  guarding.  Genitourinary: Rectum normal, prostate normal and penis normal. Guaiac negative stool. No penile tenderness.  Musculoskeletal: Normal range of motion. He exhibits no edema or tenderness.  Lymphadenopathy:    He has no cervical adenopathy.  Neurological: He is alert and oriented to person, place, and time. He has normal reflexes. No cranial nerve deficit. He exhibits normal muscle tone. Coordination normal.  Skin: Skin is warm and dry.  Psychiatric: He has a normal mood and affect. His behavior is normal. Judgment and thought content normal.  Hyperpigmented over fingers       Assessment & Plan:

## 2014-12-21 NOTE — Addendum Note (Signed)
Addended by: Cresenciano Lick on: 12/21/2014 11:27 AM   Modules accepted: Orders

## 2014-12-21 NOTE — Assessment & Plan Note (Signed)
External XRT at [facility] - (x50) in 4/15. Last PSA in 2/16 = 1.59

## 2014-12-21 NOTE — Assessment & Plan Note (Signed)
Discussed Off Levitra - pt's choice

## 2014-12-21 NOTE — Assessment & Plan Note (Signed)
Chronic - doing well on Norvasc, Losartan

## 2014-12-21 NOTE — Progress Notes (Signed)
Pre visit review using our clinic review tool, if applicable. No additional management support is needed unless otherwise documented below in the visit note. 

## 2015-04-22 ENCOUNTER — Other Ambulatory Visit: Payer: Self-pay | Admitting: Internal Medicine

## 2015-04-26 ENCOUNTER — Other Ambulatory Visit: Payer: Self-pay | Admitting: Internal Medicine

## 2015-06-24 ENCOUNTER — Ambulatory Visit (INDEPENDENT_AMBULATORY_CARE_PROVIDER_SITE_OTHER): Payer: Medicare Other | Admitting: Internal Medicine

## 2015-06-24 ENCOUNTER — Encounter: Payer: Self-pay | Admitting: Internal Medicine

## 2015-06-24 VITALS — BP 152/94 | HR 65 | Wt 165.0 lb

## 2015-06-24 DIAGNOSIS — R2231 Localized swelling, mass and lump, right upper limb: Secondary | ICD-10-CM

## 2015-06-24 NOTE — Assessment & Plan Note (Signed)
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

## 2015-06-24 NOTE — Progress Notes (Signed)
Pre visit review using our clinic review tool, if applicable. No additional management support is needed unless otherwise documented below in the visit note. 

## 2015-06-24 NOTE — Progress Notes (Signed)
Subjective:  Patient ID: Peter Wood, male    DOB: 14-Dec-1934  Age: 80 y.o. MRN: [id number]  CC: No chief complaint on file.   HPI Peter Wood presents for R 3d finger palmar prox phalanx mass x 5 months, NT. Not red. BP nl at home. PSA 1.2  Outpatient Prescriptions Prior to Visit  Medication Sig Dispense Refill  . amLODipine (NORVASC) 5 MG tablet TAKE 1 TABLET BY MOUTH ONCE A DAY 90 tablet 2  . aspirin 81 MG tablet Take 81 mg by mouth daily.      . benazepril (LOTENSIN) 40 MG tablet Take 1 tablet (40 mg total) by mouth daily. 90 tablet 3  . BIOTIN PO Take by mouth daily.    . Cholecalciferol 1000 UNITS tablet Take by mouth daily.    Marland Kitchen losartan (COZAAR) 100 MG tablet Take 1 tablet (100 mg total) by mouth daily. 30 tablet 11  . vardenafil (LEVITRA) 20 MG tablet Take 1 tablet (20 mg total) by mouth daily as needed for erectile dysfunction. 6 tablet 6   No facility-administered medications prior to visit.    ROS Review of Systems  Objective:  BP 152/94 mmHg  Pulse 65  Wt 165 lb (74.844 kg)  SpO2 97%  BP Readings from Last 3 Encounters:  06/24/15 152/94  12/21/14 138/80  06/19/14 130/85    Wt Readings from Last 3 Encounters:  06/24/15 165 lb (74.844 kg)  12/21/14 169 lb (76.658 kg)  06/19/14 163 lb (73.936 kg)    Physical Exam  R 3d finger oval palmar prox phalanx mass  Lab Results  Component Value Date   WBC 4.1 06/19/2014   HGB 13.6 06/19/2014   HCT 42.1 06/19/2014   PLT 210.0 06/19/2014   GLUCOSE 83 12/21/2014   CHOL 186 06/19/2014   TRIG 90.0 06/19/2014   HDL 43.90 06/19/2014   LDLDIRECT 126.3 09/23/2007   LDLCALC 124* 06/19/2014   ALT 25 06/19/2014   AST 24 06/19/2014   NA 136 12/21/2014   K 3.8 12/21/2014   CL 100 12/21/2014   CREATININE 1.01 12/21/2014   BUN 14 12/21/2014   CO2 30 12/21/2014   TSH 0.62 06/19/2014   PSA 6.26* 03/11/2012   PSA 6.12* 03/11/2012    Mr Prostate W / Wo Cm  08/21/2013  CLINICAL DATA:  Prostate cancer  rising PSA. Positive prostate cancer biopsy EXAM: MR PROSTATE WITHOUT AND WITH CONTRAST TECHNIQUE: Multiplanar multisequence MRI images were obtained of the pelvis, centered about the prostate, using both external and endorectal coils. Pre and post contrast images were obtained. CONTRAST:  33mL MULTIHANCE GADOBENATE DIMEGLUMINE 529 MG/ML IV SOLN COMPARISON:  None FINDINGS: Exam is degraded by patient motion. Prostate: There is heterogeneous signal intensity within the peripheral zone on T2 weighted imaging. There is loss of signal intensity within the within the left mid gland and lateral left mid gland measuring approximately 10 mm x 7 mm (image 19, series 5). This lesion abuts the capsule posteriorly. There is loss of definition of the capsule at this level and suggestion of mild extension into the rectoprostatic angle. Question all neurovascular involvement at this level (image 19, series 5). Second focus of abnormal uptake at the lateral apex on the left (image 21 series 5). Within the right aspect of the peripheral zone there is also loss of normal signal intensity at the right base (image 16, series 5). Additional focus of concern in the right lateral apex (image 21, series 5). No evidence of extracapsular  extension on the right. The central gland is nodular. Overall the prostate gland is minimally enlarged measuring 46 mm x 34 mm by 15 mm. There is loss of the normal signal intensity on T2 weighted imaging within the right seminal vesicle gland (image 13, series 5). Transcapsular spread: Concern for transscapular spread at the left mid gland Seminal vesicle involvement: Concern for involvement of the right seminal vesicle although indeterminate. Neurovascular bundle involvement: Concern for neurovascular bundle involvement on the left in a a small region. Pelvic adenopathy: None Bone metastasis: None Other findings: Central gland hypertrophy bladder appears normal. IMPRESSION: 1. Concern for foci prostate  cancer within the left mid gland, left lateral mid gland and left apex as well as the right base. 2. Concern for transcapsular spread and mild neural vascular involvement at the left mid gland. 3. Concern for right seminal vesicle involvement although indeterminate. Electronically Signed   By: Suzy Bouchard M.D.   On: 08/21/2013 15:40    Assessment & Plan:   Diagnoses and all orders for this visit:  Finger mass, right -     Ambulatory referral to Orthopedic Surgery  I am having Peter Wood maintain his aspirin, BIOTIN PO, losartan, vardenafil, amLODipine, Cholecalciferol, and benazepril.  No orders of the defined types were placed in this encounter.     Follow-up: No Follow-up on file.  Walker Kehr, MD

## 2015-08-09 ENCOUNTER — Ambulatory Visit (INDEPENDENT_AMBULATORY_CARE_PROVIDER_SITE_OTHER): Payer: Medicare Other | Admitting: Ophthalmology

## 2015-08-09 DIAGNOSIS — I10 Essential (primary) hypertension: Secondary | ICD-10-CM | POA: Diagnosis not present

## 2015-08-09 DIAGNOSIS — H35371 Puckering of macula, right eye: Secondary | ICD-10-CM

## 2015-08-09 DIAGNOSIS — H43813 Vitreous degeneration, bilateral: Secondary | ICD-10-CM | POA: Diagnosis not present

## 2015-08-09 DIAGNOSIS — H2512 Age-related nuclear cataract, left eye: Secondary | ICD-10-CM | POA: Diagnosis not present

## 2015-08-09 DIAGNOSIS — H35033 Hypertensive retinopathy, bilateral: Secondary | ICD-10-CM

## 2015-08-09 DIAGNOSIS — H43821 Vitreomacular adhesion, right eye: Secondary | ICD-10-CM | POA: Diagnosis not present

## 2015-08-13 ENCOUNTER — Other Ambulatory Visit: Payer: Self-pay | Admitting: Orthopedic Surgery

## 2015-11-24 ENCOUNTER — Other Ambulatory Visit: Payer: Self-pay | Admitting: Internal Medicine

## 2015-12-22 ENCOUNTER — Ambulatory Visit (INDEPENDENT_AMBULATORY_CARE_PROVIDER_SITE_OTHER): Payer: Medicare Other | Admitting: Internal Medicine

## 2015-12-22 ENCOUNTER — Encounter: Payer: Self-pay | Admitting: Internal Medicine

## 2015-12-22 ENCOUNTER — Other Ambulatory Visit (INDEPENDENT_AMBULATORY_CARE_PROVIDER_SITE_OTHER): Payer: Medicare Other

## 2015-12-22 VITALS — BP 128/70 | HR 68 | Wt 165.0 lb

## 2015-12-22 DIAGNOSIS — C61 Malignant neoplasm of prostate: Secondary | ICD-10-CM | POA: Diagnosis not present

## 2015-12-22 DIAGNOSIS — N529 Male erectile dysfunction, unspecified: Secondary | ICD-10-CM

## 2015-12-22 DIAGNOSIS — I10 Essential (primary) hypertension: Secondary | ICD-10-CM | POA: Diagnosis not present

## 2015-12-22 LAB — BASIC METABOLIC PANEL
BUN: 14 mg/dL (ref 6–23)
CALCIUM: 9.2 mg/dL (ref 8.4–10.5)
CO2: 30 meq/L (ref 19–32)
CREATININE: 1.03 mg/dL (ref 0.40–1.50)
Chloride: 105 mEq/L (ref 96–112)
GFR: 89.71 mL/min (ref >60.00)
Glucose, Bld: 86 mg/dL (ref 70–99)
Potassium: 4.1 mEq/L (ref 3.5–5.1)
SODIUM: 141 meq/L (ref 135–145)

## 2015-12-22 NOTE — Assessment & Plan Note (Signed)
S/p external XRT at [facility] - 2015. Last PSA ok

## 2015-12-22 NOTE — Assessment & Plan Note (Signed)
Levitra prn 

## 2015-12-22 NOTE — Progress Notes (Signed)
Pre visit review using our clinic review tool, if applicable. No additional management support is needed unless otherwise documented below in the visit note. 

## 2015-12-22 NOTE — Progress Notes (Signed)
Subjective:  Patient ID: Peter Wood, male    DOB: [birth year]  Age: 81 y.o. MRN: [id number]  CC: No chief complaint on file.   HPI Peter Wood presents for HTN, ED, prostate CA f/u  Outpatient Prescriptions Prior to Visit  Medication Sig Dispense Refill  . amLODipine (NORVASC) 5 MG tablet TAKE 1 TABLET BY MOUTH ONCE A DAY 90 tablet 2  . aspirin 81 MG tablet Take 81 mg by mouth daily.      . benazepril (LOTENSIN) 40 MG tablet Take 1 tablet (40 mg total) by mouth daily. 90 tablet 3  . BIOTIN PO Take by mouth daily.    . Cholecalciferol 1000 UNITS tablet Take by mouth daily.    Marland Kitchen losartan (COZAAR) 100 MG tablet Take 1 tablet (100 mg total) by mouth daily. (Patient not taking: Reported on 12/22/2015) 30 tablet 11  . vardenafil (LEVITRA) 20 MG tablet Take 1 tablet (20 mg total) by mouth daily as needed for erectile dysfunction. (Patient not taking: Reported on 12/22/2015) 6 tablet 6  . benazepril (LOTENSIN) 40 MG tablet TAKE 1 TABLET BY MOUTH ONCE A DAY (Patient not taking: Reported on 12/22/2015) 90 tablet 0   No facility-administered medications prior to visit.    ROS Review of Systems  Constitutional: Negative for appetite change, fatigue and unexpected weight change.  HENT: Negative for congestion, nosebleeds, sneezing, sore throat and trouble swallowing.   Eyes: Negative for itching and visual disturbance.  Respiratory: Negative for cough.   Cardiovascular: Negative for chest pain, palpitations and leg swelling.  Gastrointestinal: Negative for nausea, diarrhea, blood in stool and abdominal distention.  Genitourinary: Negative for frequency and hematuria.  Musculoskeletal: Negative for back pain, joint swelling, gait problem and neck pain.  Skin: Negative for rash.  Neurological: Negative for dizziness, tremors, speech difficulty and weakness.  Psychiatric/Behavioral: Negative for sleep disturbance, dysphoric mood and agitation. The patient is not nervous/anxious.      Objective:  BP 128/70 mmHg  Pulse 68  Wt 165 lb (74.844 kg)  SpO2 98%  BP Readings from Last 3 Encounters:  12/22/15 128/70  06/24/15 152/94  12/21/14 138/80    Wt Readings from Last 3 Encounters:  12/22/15 165 lb (74.844 kg)  06/24/15 165 lb (74.844 kg)  12/21/14 169 lb (76.658 kg)    Physical Exam  Constitutional: He is oriented to person, place, and time. He appears well-developed. No distress.  NAD  HENT:  Mouth/Throat: Oropharynx is clear and moist.  Eyes: Conjunctivae are normal. Pupils are equal, round, and reactive to light.  Neck: Normal range of motion. No JVD present. No thyromegaly present.  Cardiovascular: Normal rate, regular rhythm, normal heart sounds and intact distal pulses.  Exam reveals no gallop and no friction rub.   No murmur heard. Pulmonary/Chest: Effort normal and breath sounds normal. No respiratory distress. He has no wheezes. He has no rales. He exhibits no tenderness.  Abdominal: Soft. Bowel sounds are normal. He exhibits no distension and no mass. There is no tenderness. There is no rebound and no guarding.  Musculoskeletal: Normal range of motion. He exhibits no edema or tenderness.  Lymphadenopathy:    He has no cervical adenopathy.  Neurological: He is alert and oriented to person, place, and time. He has normal reflexes. No cranial nerve deficit. He exhibits normal muscle tone. He displays a negative Romberg sign. Coordination and gait normal.  Skin: Skin is warm and dry. No rash noted.  Psychiatric: He has a normal mood and  affect. His behavior is normal. Judgment and thought content normal.    Lab Results  Component Value Date   WBC 4.1 06/19/2014   HGB 13.6 06/19/2014   HCT 42.1 06/19/2014   PLT 210.0 06/19/2014   GLUCOSE 83 12/21/2014   CHOL 186 06/19/2014   TRIG 90.0 06/19/2014   HDL 43.90 06/19/2014   LDLDIRECT 126.3 09/23/2007   LDLCALC 124* 06/19/2014   ALT 25 06/19/2014   AST 24 06/19/2014   NA 136 12/21/2014   K  3.8 12/21/2014   CL 100 12/21/2014   CREATININE 1.01 12/21/2014   BUN 14 12/21/2014   CO2 30 12/21/2014   TSH 0.62 06/19/2014   PSA 6.26* 03/11/2012   PSA 6.12* 03/11/2012    Mr Prostate W / Wo Cm  08/21/2013  CLINICAL DATA:  Prostate cancer rising PSA. Positive prostate cancer biopsy EXAM: MR PROSTATE WITHOUT AND WITH CONTRAST TECHNIQUE: Multiplanar multisequence MRI images were obtained of the pelvis, centered about the prostate, using both external and endorectal coils. Pre and post contrast images were obtained. CONTRAST:  21mL MULTIHANCE GADOBENATE DIMEGLUMINE 529 MG/ML IV SOLN COMPARISON:  None FINDINGS: Exam is degraded by patient motion. Prostate: There is heterogeneous signal intensity within the peripheral zone on T2 weighted imaging. There is loss of signal intensity within the within the left mid gland and lateral left mid gland measuring approximately 10 mm x 7 mm (image 19, series 5). This lesion abuts the capsule posteriorly. There is loss of definition of the capsule at this level and suggestion of mild extension into the rectoprostatic angle. Question all neurovascular involvement at this level (image 19, series 5). Second focus of abnormal uptake at the lateral apex on the left (image 21 series 5). Within the right aspect of the peripheral zone there is also loss of normal signal intensity at the right base (image 16, series 5). Additional focus of concern in the right lateral apex (image 21, series 5). No evidence of extracapsular extension on the right. The central gland is nodular. Overall the prostate gland is minimally enlarged measuring 46 mm x 34 mm by 15 mm. There is loss of the normal signal intensity on T2 weighted imaging within the right seminal vesicle gland (image 13, series 5). Transcapsular spread: Concern for transscapular spread at the left mid gland Seminal vesicle involvement: Concern for involvement of the right seminal vesicle although indeterminate. Neurovascular  bundle involvement: Concern for neurovascular bundle involvement on the left in a a small region. Pelvic adenopathy: None Bone metastasis: None Other findings: Central gland hypertrophy bladder appears normal. IMPRESSION: 1. Concern for foci prostate cancer within the left mid gland, left lateral mid gland and left apex as well as the right base. 2. Concern for transcapsular spread and mild neural vascular involvement at the left mid gland. 3. Concern for right seminal vesicle involvement although indeterminate. Electronically Signed   By: Suzy Bouchard M.D.   On: 08/21/2013 15:40    Assessment & Plan:   There are no diagnoses linked to this encounter. I am having Mr. Stute maintain his aspirin, BIOTIN PO, losartan, vardenafil, amLODipine, Cholecalciferol, and benazepril.  No orders of the defined types were placed in this encounter.     Follow-up: No Follow-up on file.  Walker Kehr, MD

## 2015-12-22 NOTE — Assessment & Plan Note (Signed)
doing well on Norvasc, Benazepril Labs

## 2016-01-19 ENCOUNTER — Other Ambulatory Visit: Payer: Self-pay | Admitting: Internal Medicine

## 2016-01-21 ENCOUNTER — Other Ambulatory Visit: Payer: Self-pay | Admitting: Internal Medicine

## 2016-03-13 ENCOUNTER — Other Ambulatory Visit: Payer: Self-pay | Admitting: Internal Medicine

## 2016-05-26 ENCOUNTER — Telehealth: Payer: Self-pay | Admitting: Internal Medicine

## 2016-05-26 NOTE — Telephone Encounter (Signed)
Nidhi, nurse from [facility] called stating she assess Peter Wood and she found Peripheral Arterial Disease on his right leg. Please advise.

## 2016-05-29 NOTE — Telephone Encounter (Signed)
Noted. Thx.

## 2016-06-23 ENCOUNTER — Ambulatory Visit: Payer: Medicare Other | Admitting: Internal Medicine

## 2016-07-03 ENCOUNTER — Ambulatory Visit: Payer: Medicare Other | Admitting: Internal Medicine

## 2016-07-19 ENCOUNTER — Other Ambulatory Visit (INDEPENDENT_AMBULATORY_CARE_PROVIDER_SITE_OTHER): Payer: Medicare Other

## 2016-07-19 ENCOUNTER — Ambulatory Visit (INDEPENDENT_AMBULATORY_CARE_PROVIDER_SITE_OTHER): Payer: Medicare Other | Admitting: Internal Medicine

## 2016-07-19 ENCOUNTER — Encounter: Payer: Self-pay | Admitting: Internal Medicine

## 2016-07-19 VITALS — BP 135/89 | HR 58 | Temp 98.8°F | Wt 167.0 lb

## 2016-07-19 DIAGNOSIS — E785 Hyperlipidemia, unspecified: Secondary | ICD-10-CM | POA: Diagnosis not present

## 2016-07-19 DIAGNOSIS — I10 Essential (primary) hypertension: Secondary | ICD-10-CM

## 2016-07-19 DIAGNOSIS — Z23 Encounter for immunization: Secondary | ICD-10-CM | POA: Diagnosis not present

## 2016-07-19 DIAGNOSIS — Z0000 Encounter for general adult medical examination without abnormal findings: Secondary | ICD-10-CM | POA: Diagnosis not present

## 2016-07-19 DIAGNOSIS — M5431 Sciatica, right side: Secondary | ICD-10-CM | POA: Insufficient documentation

## 2016-07-19 DIAGNOSIS — C61 Malignant neoplasm of prostate: Secondary | ICD-10-CM | POA: Diagnosis not present

## 2016-07-19 LAB — LIPID PANEL
Cholesterol: 191 mg/dL (ref 0–200)
HDL: 49.3 mg/dL (ref >39.00)
LDL Cholesterol: 123 mg/dL — ABNORMAL HIGH (ref 0–99)
NONHDL: 141.52
Total CHOL/HDL Ratio: 4
Triglycerides: 91 mg/dL (ref 0.0–149.0)
VLDL: 18.2 mg/dL (ref 0.0–40.0)

## 2016-07-19 LAB — HEPATIC FUNCTION PANEL
ALBUMIN: 4.1 g/dL (ref 3.5–5.2)
ALK PHOS: 54 U/L (ref 39–117)
ALT: 21 U/L (ref 0–53)
AST: 21 U/L (ref 0–37)
BILIRUBIN DIRECT: 0.2 mg/dL (ref 0.0–0.3)
Total Bilirubin: 1 mg/dL (ref 0.2–1.2)
Total Protein: 7.1 g/dL (ref 6.0–8.3)

## 2016-07-19 LAB — URINALYSIS
Bilirubin Urine: NEGATIVE
Hgb urine dipstick: NEGATIVE
Ketones, ur: NEGATIVE
Leukocytes, UA: NEGATIVE
Nitrite: NEGATIVE
PH: 7 (ref 5.0–8.0)
SPECIFIC GRAVITY, URINE: 1.015 (ref 1.000–1.030)
URINE GLUCOSE: NEGATIVE
Urobilinogen, UA: 0.2 (ref 0.0–1.0)

## 2016-07-19 LAB — CBC WITH DIFFERENTIAL/PLATELET
BASOS ABS: 0 10*3/uL (ref 0.0–0.1)
Basophils Relative: 0.7 % (ref 0.0–3.0)
EOS ABS: 0.2 10*3/uL (ref 0.0–0.7)
Eosinophils Relative: 4.6 % (ref 0.0–5.0)
HCT: 42.5 % (ref 39.0–52.0)
Hemoglobin: 14 g/dL (ref 13.0–17.0)
LYMPHS ABS: 1 10*3/uL (ref 0.7–4.0)
Lymphocytes Relative: 23.1 % (ref 12.0–46.0)
MCHC: 33 g/dL (ref 30.0–36.0)
MCV: 95.7 fl (ref 78.0–100.0)
Monocytes Absolute: 0.5 10*3/uL (ref 0.1–1.0)
Monocytes Relative: 10.5 % (ref 3.0–12.0)
NEUTROS ABS: 2.6 10*3/uL (ref 1.4–7.7)
NEUTROS PCT: 61.1 % (ref 43.0–77.0)
PLATELETS: 234 10*3/uL (ref 150.0–400.0)
RBC: 4.44 Mil/uL (ref 4.22–5.81)
RDW: 13.4 % (ref 11.5–15.5)
WBC: 4.3 10*3/uL (ref 4.0–10.5)

## 2016-07-19 LAB — BASIC METABOLIC PANEL
BUN: 12 mg/dL (ref 6–23)
CALCIUM: 9.3 mg/dL (ref 8.4–10.5)
CO2: 33 meq/L — AB (ref 19–32)
CREATININE: 0.91 mg/dL (ref 0.40–1.50)
Chloride: 102 mEq/L (ref 96–112)
GFR: 103.34 mL/min (ref >60.00)
GLUCOSE: 95 mg/dL (ref 70–99)
Potassium: 4.1 mEq/L (ref 3.5–5.1)
Sodium: 139 mEq/L (ref 135–145)

## 2016-07-19 LAB — TSH: TSH: 0.86 u[IU]/mL (ref 0.35–4.50)

## 2016-07-19 NOTE — Assessment & Plan Note (Signed)
Norvasc, Benazepril Labs

## 2016-07-19 NOTE — Assessment & Plan Note (Signed)
PSA was ok

## 2016-07-19 NOTE — Patient Instructions (Signed)

## 2016-07-19 NOTE — Addendum Note (Signed)
Addended by: Cresenciano Lick on: 07/19/2016 11:50 AM   Modules accepted: Orders

## 2016-07-19 NOTE — Assessment & Plan Note (Signed)

## 2016-07-19 NOTE — Progress Notes (Signed)
Subjective:  Patient ID: Peter Wood, male    DOB: [birth year]  Age: 81 y.o. MRN: [id number]  CC: No chief complaint on file.   HPI [name] presents for a well exam. C/o RLE tingling and numbness up to the knee when sitting x 12 mo (rare)  Outpatient Medications Prior to Visit  Medication Sig Dispense Refill  . amLODipine (NORVASC) 5 MG tablet TAKE 1 TABLET BY MOUTH ONCE A DAY 90 tablet 3  . aspirin 81 MG tablet Take 81 mg by mouth daily.      . benazepril (LOTENSIN) 40 MG tablet TAKE 1 TABLET BY MOUTH ONCE A DAY 90 tablet 2  . BIOTIN PO Take by mouth daily.    . Cholecalciferol 1000 UNITS tablet Take by mouth daily.    . benazepril (LOTENSIN) 40 MG tablet Take 1 tablet (40 mg total) by mouth daily. 90 tablet 3  . vardenafil (LEVITRA) 20 MG tablet Take 1 tablet (20 mg total) by mouth daily as needed for erectile dysfunction. (Patient not taking: Reported on 07/19/2016) 6 tablet 6   No facility-administered medications prior to visit.     ROS Review of Systems  Constitutional: Negative for appetite change, fatigue and unexpected weight change.  HENT: Negative for congestion, nosebleeds, sneezing, sore throat and trouble swallowing.   Eyes: Negative for itching and visual disturbance.  Respiratory: Negative for cough.   Cardiovascular: Negative for chest pain, palpitations and leg swelling.  Gastrointestinal: Negative for abdominal distention, blood in stool, diarrhea and nausea.  Genitourinary: Negative for frequency and hematuria.  Musculoskeletal: Positive for arthralgias. Negative for back pain, gait problem, joint swelling and neck pain.  Skin: Negative for rash.  Neurological: Positive for numbness. Negative for dizziness, tremors, speech difficulty and weakness.  Psychiatric/Behavioral: Negative for agitation, dysphoric mood and sleep disturbance. The patient is not nervous/anxious.     Objective:  BP (!) 132/98   Pulse (!) 58   Temp 98.8 F (37.1 C)  (Oral)   Wt 167 lb (75.8 kg)   SpO2 97%   BMI 26.16 kg/m   BP Readings from Last 3 Encounters:  07/19/16 (!) 132/98  12/22/15 128/70  06/24/15 (!) 152/94    Wt Readings from Last 3 Encounters:  07/19/16 167 lb (75.8 kg)  12/22/15 165 lb (74.8 kg)  06/24/15 165 lb (74.8 kg)    Physical Exam  Constitutional: He is oriented to person, place, and time. He appears well-developed and well-nourished. No distress.  HENT:  Head: Normocephalic and atraumatic.  Right Ear: External ear normal.  Left Ear: External ear normal.  Nose: Nose normal.  Mouth/Throat: Oropharynx is clear and moist. No oropharyngeal exudate.  Eyes: Conjunctivae and EOM are normal. Pupils are equal, round, and reactive to light. Right eye exhibits no discharge. Left eye exhibits no discharge. No scleral icterus.  Neck: Normal range of motion. Neck supple. No JVD present. No tracheal deviation present. No thyromegaly present.  Cardiovascular: Normal rate, regular rhythm, normal heart sounds and intact distal pulses.  Exam reveals no gallop and no friction rub.   No murmur heard. Pulmonary/Chest: Effort normal and breath sounds normal. No stridor. No respiratory distress. He has no wheezes. He has no rales. He exhibits no tenderness.  Abdominal: Soft. Bowel sounds are normal. He exhibits no distension and no mass. There is no tenderness. There is no rebound and no guarding.  Genitourinary: Rectum normal, prostate normal and penis normal. Rectal exam shows guaiac negative stool. No penile tenderness.  Musculoskeletal: Normal range of motion. He exhibits no edema or tenderness.  Lymphadenopathy:    He has no cervical adenopathy.  Neurological: He is alert and oriented to person, place, and time. He has normal reflexes. No cranial nerve deficit. He exhibits normal muscle tone. Coordination normal.  Skin: Skin is warm and dry. No rash noted. He is not diaphoretic. No erythema. No pallor.  Psychiatric: He has a normal mood  and affect. His behavior is normal. Judgment and thought content normal.  strait leg elev (-)  Lab Results  Component Value Date   WBC 4.1 06/19/2014   HGB 13.6 06/19/2014   HCT 42.1 06/19/2014   PLT 210.0 06/19/2014   GLUCOSE 86 12/22/2015   CHOL 186 06/19/2014   TRIG 90.0 06/19/2014   HDL 43.90 06/19/2014   LDLDIRECT 126.3 09/23/2007   LDLCALC 124 (H) 06/19/2014   ALT 25 06/19/2014   AST 24 06/19/2014   NA 141 12/22/2015   K 4.1 12/22/2015   CL 105 12/22/2015   CREATININE 1.03 12/22/2015   BUN 14 12/22/2015   CO2 30 12/22/2015   TSH 0.62 06/19/2014   PSA 6.26 (H) 03/11/2012   PSA 6.12 (H) 03/11/2012    Mr Prostate W / Wo Cm  Result Date: 08/21/2013 CLINICAL DATA:  Prostate cancer rising PSA. Positive prostate cancer biopsy EXAM: MR PROSTATE WITHOUT AND WITH CONTRAST TECHNIQUE: Multiplanar multisequence MRI images were obtained of the pelvis, centered about the prostate, using both external and endorectal coils. Pre and post contrast images were obtained. CONTRAST:  29mL MULTIHANCE GADOBENATE DIMEGLUMINE 529 MG/ML IV SOLN COMPARISON:  None FINDINGS: Exam is degraded by patient motion. Prostate: There is heterogeneous signal intensity within the peripheral zone on T2 weighted imaging. There is loss of signal intensity within the within the left mid gland and lateral left mid gland measuring approximately 10 mm x 7 mm (image 19, series 5). This lesion abuts the capsule posteriorly. There is loss of definition of the capsule at this level and suggestion of mild extension into the rectoprostatic angle. Question all neurovascular involvement at this level (image 19, series 5). Second focus of abnormal uptake at the lateral apex on the left (image 21 series 5). Within the right aspect of the peripheral zone there is also loss of normal signal intensity at the right base (image 16, series 5). Additional focus of concern in the right lateral apex (image 21, series 5). No evidence of  extracapsular extension on the right. The central gland is nodular. Overall the prostate gland is minimally enlarged measuring 46 mm x 34 mm by 15 mm. There is loss of the normal signal intensity on T2 weighted imaging within the right seminal vesicle gland (image 13, series 5). Transcapsular spread: Concern for transscapular spread at the left mid gland Seminal vesicle involvement: Concern for involvement of the right seminal vesicle although indeterminate. Neurovascular bundle involvement: Concern for neurovascular bundle involvement on the left in a a small region. Pelvic adenopathy: None Bone metastasis: None Other findings: Central gland hypertrophy bladder appears normal. IMPRESSION: 1. Concern for foci prostate cancer within the left mid gland, left lateral mid gland and left apex as well as the right base. 2. Concern for transcapsular spread and mild neural vascular involvement at the left mid gland. 3. Concern for right seminal vesicle involvement although indeterminate. Electronically Signed   By: Suzy Bouchard M.D.   On: 08/21/2013 15:40    Assessment & Plan:   There are no diagnoses linked to this  encounter. I am having Mr. Thilges maintain his aspirin, BIOTIN PO, vardenafil, Cholecalciferol, amLODipine, and benazepril.  No orders of the defined types were placed in this encounter.    Follow-up: No Follow-up on file.  Walker Kehr, MD

## 2016-07-19 NOTE — Assessment & Plan Note (Signed)
likely due to a pressure from the wallet - move the wallet to the front pocket

## 2016-07-19 NOTE — Progress Notes (Signed)
Pre visit review using our clinic review tool, if applicable. No additional management support is needed unless otherwise documented below in the visit note. 

## 2016-08-08 ENCOUNTER — Ambulatory Visit (INDEPENDENT_AMBULATORY_CARE_PROVIDER_SITE_OTHER): Payer: Medicare Other | Admitting: Ophthalmology

## 2016-08-08 DIAGNOSIS — I10 Essential (primary) hypertension: Secondary | ICD-10-CM | POA: Diagnosis not present

## 2016-08-08 DIAGNOSIS — H35033 Hypertensive retinopathy, bilateral: Secondary | ICD-10-CM

## 2016-08-08 DIAGNOSIS — H43813 Vitreous degeneration, bilateral: Secondary | ICD-10-CM | POA: Diagnosis not present

## 2016-08-08 DIAGNOSIS — H35372 Puckering of macula, left eye: Secondary | ICD-10-CM

## 2016-08-08 DIAGNOSIS — H35341 Macular cyst, hole, or pseudohole, right eye: Secondary | ICD-10-CM

## 2016-08-25 ENCOUNTER — Encounter (INDEPENDENT_AMBULATORY_CARE_PROVIDER_SITE_OTHER): Payer: Medicare Other | Admitting: Ophthalmology

## 2016-08-25 DIAGNOSIS — H35372 Puckering of macula, left eye: Secondary | ICD-10-CM

## 2016-08-25 DIAGNOSIS — H35341 Macular cyst, hole, or pseudohole, right eye: Secondary | ICD-10-CM

## 2016-08-25 DIAGNOSIS — H43813 Vitreous degeneration, bilateral: Secondary | ICD-10-CM | POA: Diagnosis not present

## 2016-08-25 DIAGNOSIS — H35033 Hypertensive retinopathy, bilateral: Secondary | ICD-10-CM | POA: Diagnosis not present

## 2016-08-25 DIAGNOSIS — I10 Essential (primary) hypertension: Secondary | ICD-10-CM

## 2016-08-25 NOTE — H&P (Signed)
Peter Wood is an 81 y.o. male.   Chief Complaint:loss of vision right eye HPI: Loss of central vision right eye over six months  Past Medical History:  Diagnosis Date  . Diverticulosis of colon   . HTN (hypertension)   . Hx of colonic polyp     Past Surgical History:  Procedure Laterality Date  . CATARACT EXTRACTION  08/27/12   Right  . COLONOSCOPY    . KNEE SURGERY  1989   left    Family History  Problem Relation Age of Onset  . Hypertension Other   . Prostate cancer Father    Social History:  reports that he has quit smoking. He has never used smokeless tobacco. He reports that he does not drink alcohol or use drugs.  Allergies: No Known Allergies  No prescriptions prior to admission.    Review of systems otherwise negative  There were no vitals taken for this visit.  Physical exam: Mental status: oriented x3. Eyes: See eye exam associated with this date of surgery in media tab.  Scanned in by scanning center Ears, Nose, Throat: within normal limits Neck: Within Normal limits General: within normal limits Chest: Within normal limits Breast: deferred Heart: Within normal limits Abdomen: Within normal limits GU: deferred Extremities: within normal limits Skin: within normal limits  Assessment/Plan Macular hole right eye stage 3 Plan: To [facility] for Pars plana vitrectomy, membrane peel, serum patch, laser, gas injection right eye.  Peter Wood 08/25/2016, 11:25 AM

## 2016-09-11 HISTORY — PX: EYE SURGERY: SHX253

## 2016-09-18 ENCOUNTER — Encounter (HOSPITAL_COMMUNITY): Payer: Self-pay | Admitting: *Deleted

## 2016-09-18 NOTE — Progress Notes (Signed)
Spoke with pt for pre-op call. Pt denies cardiac history, chest pain or sob. 

## 2016-09-19 ENCOUNTER — Ambulatory Visit (HOSPITAL_COMMUNITY): Payer: Medicare Other | Admitting: Certified Registered Nurse Anesthetist

## 2016-09-19 ENCOUNTER — Encounter (HOSPITAL_COMMUNITY)
Admission: AD | Disposition: A | Discharge status: Home / Self Care | Payer: Self-pay | Source: Ambulatory Visit | Attending: Ophthalmology

## 2016-09-19 ENCOUNTER — Observation Stay (HOSPITAL_COMMUNITY)
Admission: AD | Admit: 2016-09-19 | Discharge: 2016-09-20 | Disposition: A | Discharge status: Home / Self Care | Payer: Medicare Other | Source: Ambulatory Visit | Attending: Ophthalmology | Admitting: Ophthalmology

## 2016-09-19 ENCOUNTER — Encounter (HOSPITAL_COMMUNITY): Payer: Self-pay | Admitting: Certified Registered Nurse Anesthetist

## 2016-09-19 DIAGNOSIS — I739 Peripheral vascular disease, unspecified: Secondary | ICD-10-CM | POA: Insufficient documentation

## 2016-09-19 DIAGNOSIS — H35341 Macular cyst, hole, or pseudohole, right eye: Secondary | ICD-10-CM | POA: Diagnosis not present

## 2016-09-19 DIAGNOSIS — H43813 Vitreous degeneration, bilateral: Secondary | ICD-10-CM | POA: Diagnosis not present

## 2016-09-19 DIAGNOSIS — Z87891 Personal history of nicotine dependence: Secondary | ICD-10-CM | POA: Insufficient documentation

## 2016-09-19 DIAGNOSIS — H5461 Unqualified visual loss, right eye, normal vision left eye: Secondary | ICD-10-CM | POA: Diagnosis present

## 2016-09-19 DIAGNOSIS — I10 Essential (primary) hypertension: Secondary | ICD-10-CM | POA: Diagnosis not present

## 2016-09-19 HISTORY — DX: Pneumonia, unspecified organism: J18.9

## 2016-09-19 HISTORY — PX: 25 GAUGE PARS PLANA VITRECTOMY WITH 20 GAUGE MVR PORT FOR MACULAR HOLE: SHX6096

## 2016-09-19 HISTORY — DX: Other specified symptoms and signs involving the circulatory and respiratory systems: R09.89

## 2016-09-19 HISTORY — DX: Unspecified osteoarthritis, unspecified site: M19.90

## 2016-09-19 LAB — BASIC METABOLIC PANEL
ANION GAP: 8 (ref 5–15)
BUN: 13 mg/dL (ref 6–20)
CHLORIDE: 104 mmol/L (ref 101–111)
CO2: 27 mmol/L (ref 22–32)
Calcium: 8.8 mg/dL — ABNORMAL LOW (ref 8.9–10.3)
Creatinine, Ser: 1.04 mg/dL (ref 0.61–1.24)
GFR calc non Af Amer: >60 mL/min (ref >60)
Glucose, Bld: 97 mg/dL (ref 65–99)
POTASSIUM: 3.6 mmol/L (ref 3.5–5.1)
SODIUM: 139 mmol/L (ref 135–145)

## 2016-09-19 LAB — CBC
HEMATOCRIT: 40.2 % (ref 39.0–52.0)
HEMOGLOBIN: 13 g/dL (ref 13.0–17.0)
MCH: 31.1 pg (ref 26.0–34.0)
MCHC: 32.3 g/dL (ref 30.0–36.0)
MCV: 96.2 fL (ref 78.0–100.0)
Platelets: 200 10*3/uL (ref 150–400)
RBC: 4.18 MIL/uL — AB (ref 4.22–5.81)
RDW: 13 % (ref 11.5–15.5)
WBC: 4.3 10*3/uL (ref 4.0–10.5)

## 2016-09-19 LAB — AUTOLOGOUS SERUM PATCH PREP

## 2016-09-19 SURGERY — 25 GAUGE PARS PLANA VITRECTOMY WITH 20 GAUGE MVR PORT FOR MACULAR HOLE
Anesthesia: General | Site: Eye | Laterality: Right

## 2016-09-19 MED ORDER — PROPOFOL 10 MG/ML IV BOLUS
INTRAVENOUS | Status: AC
  Filled 2016-09-19: qty 20

## 2016-09-19 MED ORDER — STERILE WATER FOR INJECTION IJ SOLN
INTRAMUSCULAR | Status: AC
  Filled 2016-09-19: qty 20

## 2016-09-19 MED ORDER — PHENYLEPHRINE HCL 2.5 % OP SOLN
OPHTHALMIC | Status: AC
  Administered 2016-09-19: 1 [drp] via OPHTHALMIC
  Filled 2016-09-19: qty 2

## 2016-09-19 MED ORDER — HYPROMELLOSE (GONIOSCOPIC) 2.5 % OP SOLN
OPHTHALMIC | Status: AC
  Filled 2016-09-19: qty 15

## 2016-09-19 MED ORDER — ROCURONIUM BROMIDE 50 MG/5ML IV SOSY
PREFILLED_SYRINGE | INTRAVENOUS | Status: AC
  Filled 2016-09-19: qty 5

## 2016-09-19 MED ORDER — ACETAZOLAMIDE SODIUM 500 MG IJ SOLR
500.0000 mg | Freq: Once | INTRAMUSCULAR | Status: AC
  Administered 2016-09-20: 500 mg via INTRAVENOUS
  Filled 2016-09-19: qty 500

## 2016-09-19 MED ORDER — MAGNESIUM HYDROXIDE 400 MG/5ML PO SUSP: 15.0000 mL | Freq: Four times a day (QID) | ORAL | Status: DC | PRN

## 2016-09-19 MED ORDER — DEXAMETHASONE SODIUM PHOSPHATE 10 MG/ML IJ SOLN
INTRAMUSCULAR | Status: DC | PRN
  Administered 2016-09-19: 10 mg

## 2016-09-19 MED ORDER — PHENYLEPHRINE HCL 2.5 % OP SOLN
1.0000 [drp] | OPHTHALMIC | Status: AC | PRN
  Administered 2016-09-19 (×3): 1 [drp] via OPHTHALMIC

## 2016-09-19 MED ORDER — LATANOPROST 0.005 % OP SOLN
1.0000 [drp] | Freq: Every day | OPHTHALMIC | Status: DC
  Filled 2016-09-19: qty 2.5

## 2016-09-19 MED ORDER — PREDNISOLONE ACETATE 1 % OP SUSP
1.0000 [drp] | Freq: Four times a day (QID) | OPHTHALMIC | Status: DC
  Filled 2016-09-19: qty 5

## 2016-09-19 MED ORDER — POLYMYXIN B SULFATE 500000 UNITS IJ SOLR
INTRAMUSCULAR | Status: AC
  Filled 2016-09-19: qty 500000

## 2016-09-19 MED ORDER — TETRACAINE HCL 0.5 % OP SOLN
2.0000 [drp] | Freq: Once | OPHTHALMIC | Status: DC
  Filled 2016-09-19: qty 2

## 2016-09-19 MED ORDER — SODIUM CHLORIDE 0.9 % IJ SOLN
INTRAMUSCULAR | Status: AC
  Filled 2016-09-19: qty 10

## 2016-09-19 MED ORDER — ESMOLOL HCL 100 MG/10ML IV SOLN
INTRAVENOUS | Status: DC | PRN
  Administered 2016-09-19: 20 mg via INTRAVENOUS

## 2016-09-19 MED ORDER — CYCLOPENTOLATE HCL 1 % OP SOLN
OPHTHALMIC | Status: AC
  Administered 2016-09-19: 1 [drp] via OPHTHALMIC
  Filled 2016-09-19: qty 2

## 2016-09-19 MED ORDER — ATROPINE SULFATE 1 % OP SOLN
OPHTHALMIC | Status: AC
  Filled 2016-09-19: qty 5

## 2016-09-19 MED ORDER — BUPIVACAINE HCL (PF) 0.75 % IJ SOLN
INTRAMUSCULAR | Status: AC
  Filled 2016-09-19: qty 10

## 2016-09-19 MED ORDER — LIDOCAINE HCL 2 % IJ SOLN
INTRAMUSCULAR | Status: AC
  Filled 2016-09-19: qty 20

## 2016-09-19 MED ORDER — SUGAMMADEX SODIUM 200 MG/2ML IV SOLN
INTRAVENOUS | Status: DC | PRN
  Administered 2016-09-19: 200 mg via INTRAVENOUS

## 2016-09-19 MED ORDER — BRIMONIDINE TARTRATE 0.2 % OP SOLN
1.0000 [drp] | Freq: Two times a day (BID) | OPHTHALMIC | Status: DC
  Filled 2016-09-19: qty 5

## 2016-09-19 MED ORDER — ALBUTEROL SULFATE HFA 108 (90 BASE) MCG/ACT IN AERS
INHALATION_SPRAY | RESPIRATORY_TRACT | Status: DC | PRN
  Administered 2016-09-19: 2 via RESPIRATORY_TRACT

## 2016-09-19 MED ORDER — HYDROMORPHONE HCL 1 MG/ML IJ SOLN: 0.2500 mg | INTRAMUSCULAR | Status: DC | PRN

## 2016-09-19 MED ORDER — HYALURONIDASE HUMAN 150 UNIT/ML IJ SOLN
INTRAMUSCULAR | Status: AC
  Filled 2016-09-19: qty 1

## 2016-09-19 MED ORDER — VITAMIN D 1000 UNITS PO TABS
1000.0000 [IU] | ORAL_TABLET | Freq: Every day | ORAL | Status: DC
Start: 2016-09-19 — End: 2016-09-20

## 2016-09-19 MED ORDER — PROPOFOL 10 MG/ML IV BOLUS
INTRAVENOUS | Status: DC | PRN
  Administered 2016-09-19: 150 mg via INTRAVENOUS
  Administered 2016-09-19: 40 mg via INTRAVENOUS

## 2016-09-19 MED ORDER — SODIUM CHLORIDE 0.9 % IJ SOLN: INTRAMUSCULAR | Status: DC | PRN

## 2016-09-19 MED ORDER — EPINEPHRINE PF 1 MG/ML IJ SOLN
INTRAMUSCULAR | Status: AC
  Filled 2016-09-19: qty 1

## 2016-09-19 MED ORDER — GATIFLOXACIN 0.5 % OP SOLN
1.0000 [drp] | OPHTHALMIC | Status: AC | PRN
  Administered 2016-09-19 (×3): 1 [drp] via OPHTHALMIC

## 2016-09-19 MED ORDER — TEMAZEPAM 15 MG PO CAPS: 15.0000 mg | ORAL_CAPSULE | Freq: Every evening | ORAL | Status: DC | PRN

## 2016-09-19 MED ORDER — FENTANYL CITRATE (PF) 100 MCG/2ML IJ SOLN
INTRAMUSCULAR | Status: AC
  Filled 2016-09-19: qty 2

## 2016-09-19 MED ORDER — TRIAMCINOLONE ACETONIDE 40 MG/ML IJ SUSP
INTRAMUSCULAR | Status: AC
  Filled 2016-09-19: qty 5

## 2016-09-19 MED ORDER — FENTANYL CITRATE (PF) 100 MCG/2ML IJ SOLN
INTRAMUSCULAR | Status: DC | PRN
  Administered 2016-09-19: 50 ug via INTRAVENOUS
  Administered 2016-09-19 (×2): 25 ug via INTRAVENOUS
  Administered 2016-09-19: 100 ug via INTRAVENOUS

## 2016-09-19 MED ORDER — CEFAZOLIN SODIUM-DEXTROSE 2-4 GM/100ML-% IV SOLN
2.0000 g | INTRAVENOUS | Status: AC
  Administered 2016-09-19: 2 g via INTRAVENOUS
  Filled 2016-09-19: qty 100

## 2016-09-19 MED ORDER — BUPIVACAINE HCL (PF) 0.75 % IJ SOLN
INTRAMUSCULAR | Status: DC | PRN
  Administered 2016-09-19: 10 mL

## 2016-09-19 MED ORDER — BSS PLUS IO SOLN
INTRAOCULAR | Status: AC
  Filled 2016-09-19: qty 500

## 2016-09-19 MED ORDER — LIDOCAINE 2% (20 MG/ML) 5 ML SYRINGE
INTRAMUSCULAR | Status: AC
  Filled 2016-09-19: qty 10

## 2016-09-19 MED ORDER — BENAZEPRIL HCL 40 MG PO TABS
40.0000 mg | ORAL_TABLET | Freq: Every day | ORAL | Status: DC
  Filled 2016-09-19 (×2): qty 1

## 2016-09-19 MED ORDER — AMLODIPINE BESYLATE 5 MG PO TABS
5.0000 mg | ORAL_TABLET | Freq: Every day | ORAL | Status: DC
Start: 2016-09-19 — End: 2016-09-20

## 2016-09-19 MED ORDER — BSS PLUS IO SOLN
INTRAOCULAR | Status: DC | PRN
  Administered 2016-09-19: 1 via INTRAOCULAR

## 2016-09-19 MED ORDER — BACITRACIN-POLYMYXIN B 500-10000 UNIT/GM OP OINT
1.0000 "application " | TOPICAL_OINTMENT | Freq: Three times a day (TID) | OPHTHALMIC | Status: DC
  Filled 2016-09-19: qty 3.5

## 2016-09-19 MED ORDER — TROPICAMIDE 1 % OP SOLN
OPHTHALMIC | Status: AC
  Administered 2016-09-19: 1 [drp] via OPHTHALMIC
  Filled 2016-09-19: qty 15

## 2016-09-19 MED ORDER — ACETAMINOPHEN 325 MG PO TABS: 325.0000 mg | ORAL_TABLET | ORAL | Status: DC | PRN

## 2016-09-19 MED ORDER — GATIFLOXACIN 0.5 % OP SOLN
OPHTHALMIC | Status: AC
  Administered 2016-09-19: 1 [drp] via OPHTHALMIC
  Filled 2016-09-19: qty 2.5

## 2016-09-19 MED ORDER — ROCURONIUM BROMIDE 100 MG/10ML IV SOLN
INTRAVENOUS | Status: DC | PRN
  Administered 2016-09-19: 50 mg via INTRAVENOUS

## 2016-09-19 MED ORDER — EPHEDRINE SULFATE 50 MG/ML IJ SOLN
INTRAMUSCULAR | Status: DC | PRN
  Administered 2016-09-19: 5 mg via INTRAVENOUS

## 2016-09-19 MED ORDER — HYDROCODONE-ACETAMINOPHEN 5-325 MG PO TABS: 1.0000 | ORAL_TABLET | ORAL | Status: DC | PRN

## 2016-09-19 MED ORDER — ONDANSETRON HCL 4 MG/2ML IJ SOLN: 4.0000 mg | Freq: Four times a day (QID) | INTRAMUSCULAR | Status: DC | PRN

## 2016-09-19 MED ORDER — PROMETHAZINE HCL 25 MG/ML IJ SOLN: 6.2500 mg | INTRAMUSCULAR | Status: DC | PRN

## 2016-09-19 MED ORDER — DEXAMETHASONE SODIUM PHOSPHATE 10 MG/ML IJ SOLN
INTRAMUSCULAR | Status: AC
  Filled 2016-09-19: qty 1

## 2016-09-19 MED ORDER — SODIUM CHLORIDE 0.45 % IV SOLN
INTRAVENOUS | Status: DC
  Administered 2016-09-19: 19:00:00 via INTRAVENOUS

## 2016-09-19 MED ORDER — DORZOLAMIDE HCL 2 % OP SOLN
1.0000 [drp] | Freq: Three times a day (TID) | OPHTHALMIC | Status: DC
  Filled 2016-09-19: qty 10

## 2016-09-19 MED ORDER — GATIFLOXACIN 0.5 % OP SOLN
1.0000 [drp] | Freq: Four times a day (QID) | OPHTHALMIC | Status: DC
  Filled 2016-09-19: qty 2.5

## 2016-09-19 MED ORDER — TROPICAMIDE 1 % OP SOLN
1.0000 [drp] | OPHTHALMIC | Status: AC | PRN
  Administered 2016-09-19 (×3): 1 [drp] via OPHTHALMIC

## 2016-09-19 MED ORDER — EPINEPHRINE PF 1 MG/ML IJ SOLN
INTRAMUSCULAR | Status: DC | PRN
  Administered 2016-09-19: .3 mL

## 2016-09-19 MED ORDER — MIDAZOLAM HCL 2 MG/2ML IJ SOLN
INTRAMUSCULAR | Status: AC
  Filled 2016-09-19: qty 2

## 2016-09-19 MED ORDER — ACETAZOLAMIDE SODIUM 500 MG IJ SOLR
INTRAMUSCULAR | Status: AC
  Filled 2016-09-19: qty 500

## 2016-09-19 MED ORDER — BRIMONIDINE TARTRATE 0.15 % OP SOLN
1.0000 [drp] | Freq: Two times a day (BID) | OPHTHALMIC | Status: DC
  Administered 2016-09-19: 1 [drp] via OPHTHALMIC
  Filled 2016-09-19 (×2): qty 5

## 2016-09-19 MED ORDER — CYCLOPENTOLATE HCL 1 % OP SOLN
1.0000 [drp] | OPHTHALMIC | Status: AC | PRN
  Administered 2016-09-19 (×3): 1 [drp] via OPHTHALMIC

## 2016-09-19 MED ORDER — STERILE WATER FOR INJECTION IJ SOLN
INTRAMUSCULAR | Status: DC | PRN
  Administered 2016-09-19: 20 mL

## 2016-09-19 MED ORDER — LIDOCAINE HCL (CARDIAC) 20 MG/ML IV SOLN
INTRAVENOUS | Status: DC | PRN
  Administered 2016-09-19: 60 mg via INTRAVENOUS

## 2016-09-19 MED ORDER — BSS IO SOLN
INTRAOCULAR | Status: AC
  Filled 2016-09-19: qty 15

## 2016-09-19 MED ORDER — ATROPINE SULFATE 1 % OP SOLN
OPHTHALMIC | Status: DC | PRN
  Administered 2016-09-19: 2 [drp]

## 2016-09-19 MED ORDER — SODIUM CHLORIDE 0.9 % IV SOLN
INTRAVENOUS | Status: DC
  Administered 2016-09-19 (×2): via INTRAVENOUS

## 2016-09-19 MED ORDER — SODIUM HYALURONATE 10 MG/ML IO SOLN
INTRAOCULAR | Status: AC
  Filled 2016-09-19: qty 0.85

## 2016-09-19 MED ORDER — CEFTAZIDIME 1 G IJ SOLR
INTRAMUSCULAR | Status: AC
  Filled 2016-09-19: qty 1

## 2016-09-19 MED ORDER — BACITRACIN-POLYMYXIN B 500-10000 UNIT/GM OP OINT
TOPICAL_OINTMENT | OPHTHALMIC | Status: AC
  Filled 2016-09-19: qty 3.5

## 2016-09-19 MED ORDER — MORPHINE SULFATE (PF) 2 MG/ML IV SOLN: 1.0000 mg | INTRAVENOUS | Status: DC | PRN

## 2016-09-19 SURGICAL SUPPLY — 63 items
BALL CTTN LRG ABS STRL LF (GAUZE/BANDAGES/DRESSINGS) ×3
BLADE EYE CATARACT 19 1.4 BEAV (BLADE) IMPLANT
BLADE MVR KNIFE 19G (BLADE) IMPLANT
BLADE MVR KNIFE 20G (BLADE) ×3 IMPLANT
CANNULA VLV SOFT TIP 25G (OPHTHALMIC) ×1 IMPLANT
CANNULA VLV SOFT TIP 25GA (OPHTHALMIC) ×3 IMPLANT
CORDS BIPOLAR (ELECTRODE) ×3 IMPLANT
COTTONBALL LRG STERILE PKG (GAUZE/BANDAGES/DRESSINGS) ×9 IMPLANT
COVER MAYO STAND STRL (DRAPES) IMPLANT
DRAPE INCISE 51X51 W/FILM STRL (DRAPES) IMPLANT
DRAPE OPHTHALMIC 77X100 STRL (CUSTOM PROCEDURE TRAY) ×3 IMPLANT
ERASER HMR WETFIELD 23G BP (MISCELLANEOUS) ×3 IMPLANT
FILTER BLUE MILLIPORE (MISCELLANEOUS) IMPLANT
FILTER STRAW FLUID ASPIR (MISCELLANEOUS) ×6 IMPLANT
FORCEPS GRIESHABER ILM 25G A (INSTRUMENTS) IMPLANT
GAS AUTO FILL CONSTEL (OPHTHALMIC) ×3
GAS AUTO FILL CONSTELLATION (OPHTHALMIC) ×1 IMPLANT
GLOVE SS BIOGEL STRL SZ 6.5 (GLOVE) ×1 IMPLANT
GLOVE SS BIOGEL STRL SZ 7 (GLOVE) ×1 IMPLANT
GLOVE SUPERSENSE BIOGEL SZ 6.5 (GLOVE) ×2
GLOVE SUPERSENSE BIOGEL SZ 7 (GLOVE) ×2
GLOVE SURG 8.5 LATEX PF (GLOVE) ×3 IMPLANT
GOWN STRL REUS W/ TWL LRG LVL3 (GOWN DISPOSABLE) ×3 IMPLANT
GOWN STRL REUS W/TWL LRG LVL3 (GOWN DISPOSABLE) ×9
HANDLE PNEUMATIC FOR CONSTEL (OPHTHALMIC) IMPLANT
KIT BASIN OR (CUSTOM PROCEDURE TRAY) ×3 IMPLANT
KNIFE GRIESHABER SHARP 2.5MM (MISCELLANEOUS) IMPLANT
MICROPICK 25G (MISCELLANEOUS)
NDL HYPO 30X.5 LL (NEEDLE) IMPLANT
NEEDLE 18GX1X1/2 (RX/OR ONLY) (NEEDLE) ×6 IMPLANT
NEEDLE 25GX 5/8IN NON SAFETY (NEEDLE) ×3 IMPLANT
NEEDLE FILTER BLUNT 18X 1/2SAF (NEEDLE) ×2
NEEDLE FILTER BLUNT 18X1 1/2 (NEEDLE) ×1 IMPLANT
NEEDLE HYPO 30X.5 LL (NEEDLE) IMPLANT
NS IRRIG 1000ML POUR BTL (IV SOLUTION) ×3 IMPLANT
PACK FRAGMATOME (OPHTHALMIC) IMPLANT
PACK VITRECTOMY CUSTOM (CUSTOM PROCEDURE TRAY) ×3 IMPLANT
PAD ARMBOARD 7.5X6 YLW CONV (MISCELLANEOUS) ×6 IMPLANT
PAK PIK VITRECTOMY CVS 25GA (OPHTHALMIC) ×3 IMPLANT
PIC ILLUMINATED 25G (OPHTHALMIC) ×3
PICK MICROPICK 25G (MISCELLANEOUS) IMPLANT
PIK ILLUMINATED 25G (OPHTHALMIC) ×1 IMPLANT
PROBE LASER ILLUM FLEX CVD 25G (OPHTHALMIC) IMPLANT
REPL STRA BRUSH NDL (NEEDLE) ×1 IMPLANT
REPL STRA BRUSH NEEDLE (NEEDLE) ×3 IMPLANT
RESERVOIR BACK FLUSH (MISCELLANEOUS) ×3 IMPLANT
ROLLS DENTAL (MISCELLANEOUS) ×6 IMPLANT
SCRAPER DIAMOND 25GA (OPHTHALMIC RELATED) IMPLANT
SCRAPER DIAMOND DUST MEMBRANE (MISCELLANEOUS) ×3 IMPLANT
SPONGE SURGIFOAM ABS GEL 12-7 (HEMOSTASIS) ×6 IMPLANT
STOPCOCK 4 WAY LG BORE MALE ST (IV SETS) IMPLANT
SUT CHROMIC 7 0 TG140 8 (SUTURE) IMPLANT
SUT ETHILON 9 0 TG140 8 (SUTURE) ×3 IMPLANT
SUT POLY NON ABSORB 10-0 8 STR (SUTURE) IMPLANT
SUT SILK 4 0 RB 1 (SUTURE) IMPLANT
SYR 20CC LL (SYRINGE) ×3 IMPLANT
SYR 5ML LL (SYRINGE) IMPLANT
SYR BULB 3OZ (MISCELLANEOUS) ×3 IMPLANT
SYR TB 1ML LUER SLIP (SYRINGE) ×3 IMPLANT
SYRINGE 10CC LL (SYRINGE) IMPLANT
TUBING HIGH PRESS EXTEN 6IN (TUBING) IMPLANT
WATER STERILE IRR 1000ML POUR (IV SOLUTION) ×3 IMPLANT
WIPE INSTRUMENT VISIWIPE 73X73 (MISCELLANEOUS) IMPLANT

## 2016-09-19 NOTE — Brief Op Note (Signed)
09/19/2016  12:29 PM  PATIENT:  [name]  82 y.o. male  PRE-OPERATIVE DIAGNOSIS:  macular hole right eye  POST-OPERATIVE DIAGNOSIS:  macular hole right eye  PROCEDURE:  Procedure(s): 25 GAUGE PARS PLANA VITRECTOMY WITH 20 GAUGE MVR PORT FOR MACULAR HOLE (Right)  SURGEON:  Surgeon(s) and Role:    * [name], MD - Primary  PHYSICIAN ASSISTANT:   Brief Operative note   Preoperative diagnosis:  macular hole right eye Postoperative diagnosis  * No Diagnosis Codes entered *  Procedures: @ORPROCAL @  Surgeon:  [name], MD...  Assistant:  Deatra Ina SA  Anesthesia: General  Specimen: none  Estimated blood loss:  1cc  Complications: none  Patient sent to PACU in good condition  Composed by [name] MD  Dictation number: [phone]

## 2016-09-19 NOTE — H&P (Signed)
I examined the patient today and there is no change in the medical status 

## 2016-09-19 NOTE — Transfer of Care (Signed)
Immediate Anesthesia Transfer of Care Note  Patient: JOSHAU KISHBAUGH  Procedure(s) Performed: Procedure(s): 25 GAUGE PARS PLANA VITRECTOMY WITH 20 GAUGE MVR PORT FOR MACULAR HOLE, MEMBRANE PEEL, SERUM PATCH, GAS FLUID EXCHANGE, HEADSCOPE LASER (Right)  Patient Location: PACU  Anesthesia Type:General  Level of Consciousness: awake and patient cooperative  Airway & Oxygen Therapy: Patient Spontanous Breathing and Patient connected to face mask oxygen  Post-op Assessment: Report given to RN and Post -op Vital signs reviewed and stable  Post vital signs: Reviewed and stable  Last Vitals:  Vitals:   09/19/16 0932 09/19/16 1239  BP: (!) 157/95 126/82  Pulse: 63 63  Resp: 20 14  Temp: 37.1 C 36.6 C    Last Pain:  Vitals:   09/19/16 1239  TempSrc:   PainSc: Asleep      Patients Stated Pain Goal: 3 ([id number])  Complications: No apparent anesthesia complications

## 2016-09-19 NOTE — Anesthesia Postprocedure Evaluation (Addendum)
Anesthesia Post Note  Patient: Peter Wood  Procedure(s) Performed: Procedure(s) (LRB): 25 GAUGE PARS PLANA VITRECTOMY WITH 20 GAUGE MVR PORT FOR MACULAR HOLE, MEMBRANE PEEL, SERUM PATCH, GAS FLUID EXCHANGE, HEADSCOPE LASER (Right)  Patient location during evaluation: PACU Anesthesia Type: General Level of consciousness: awake and alert Pain management: pain level controlled Vital Signs Assessment: post-procedure vital signs reviewed and stable Respiratory status: spontaneous breathing, nonlabored ventilation, respiratory function stable and patient connected to nasal cannula oxygen Cardiovascular status: blood pressure returned to baseline and stable Postop Assessment: no signs of nausea or vomiting Anesthetic complications: no       Last Vitals:  Vitals:   09/19/16 1330 09/19/16 1430  BP: 132/79 137/84  Pulse: (!) 57 61  Resp: 12 18  Temp:      Last Pain:  Vitals:   09/19/16 1430  TempSrc:   PainSc: Asleep                 Cathlin Buchan,JAMES TERRILL

## 2016-09-19 NOTE — Anesthesia Procedure Notes (Signed)
Procedure Name: Intubation Date/Time: 09/19/2016 11:23 AM Performed by: Shirlyn Goltz Pre-anesthesia Checklist: Patient identified, Emergency Drugs available, Suction available and Patient being monitored Patient Re-evaluated:Patient Re-evaluated prior to inductionOxygen Delivery Method: Circle system utilized Preoxygenation: Pre-oxygenation with 100% oxygen Intubation Type: IV induction Ventilation: Mask ventilation without difficulty and Oral airway inserted - appropriate to patient size Laryngoscope Size: Mac and 3 Grade View: Grade III Tube type: Oral Tube size: 7.0 mm Number of attempts: 1 Airway Equipment and Method: Stylet Placement Confirmation: ETT inserted through vocal cords under direct vision,  positive ETCO2 and breath sounds checked- equal and bilateral Secured at: 23 cm Tube secured with: Tape Dental Injury: Teeth and Oropharynx as per pre-operative assessment

## 2016-09-19 NOTE — Anesthesia Preprocedure Evaluation (Signed)
Anesthesia Evaluation  Patient identified by MRN, date of birth, ID band Patient awake    Reviewed: Allergy & Precautions, NPO status , Patient's Chart, lab work & pertinent test results  Airway Mallampati: II  TM Distance: >3 FB Neck ROM: Full    Dental  (+) Partial Upper   Pulmonary neg pulmonary ROS, former smoker,    breath sounds clear to auscultation       Cardiovascular hypertension, + Peripheral Vascular Disease   Rhythm:Regular Rate:Normal + Systolic murmurs    Neuro/Psych    GI/Hepatic negative GI ROS, Neg liver ROS,   Endo/Other  negative endocrine ROS  Renal/GU negative Renal ROS     Musculoskeletal  (+) Arthritis ,   Abdominal   Peds  Hematology negative hematology ROS (+)   Anesthesia Other Findings   Reproductive/Obstetrics                             Anesthesia Physical Anesthesia Plan  ASA: II  Anesthesia Plan: General   Post-op Pain Management:    Induction: Intravenous  Airway Management Planned: Oral ETT  Additional Equipment:   Intra-op Plan:   Post-operative Plan: Extubation in OR  Informed Consent: I have reviewed the patients History and Physical, chart, labs and discussed the procedure including the risks, benefits and alternatives for the proposed anesthesia with the patient or authorized representative who has indicated his/her understanding and acceptance.   Dental advisory given  Plan Discussed with:   Anesthesia Plan Comments:         Anesthesia Quick Evaluation

## 2016-09-20 ENCOUNTER — Encounter (HOSPITAL_COMMUNITY): Payer: Self-pay | Admitting: Ophthalmology

## 2016-09-20 DIAGNOSIS — H35341 Macular cyst, hole, or pseudohole, right eye: Secondary | ICD-10-CM | POA: Diagnosis not present

## 2016-09-20 MED ORDER — DORZOLAMIDE HCL 2 % OP SOLN: 1.0000 [drp] | Freq: Three times a day (TID) | OPHTHALMIC | 12 refills | Status: DC

## 2016-09-20 MED ORDER — BRIMONIDINE TARTRATE 0.2 % OP SOLN: 1.0000 [drp] | Freq: Two times a day (BID) | OPHTHALMIC | 12 refills | Status: DC

## 2016-09-20 MED ORDER — PREDNISOLONE ACETATE 1 % OP SUSP: 1.0000 [drp] | Freq: Four times a day (QID) | OPHTHALMIC | 0 refills | Status: DC

## 2016-09-20 MED ORDER — BACITRACIN-POLYMYXIN B 500-10000 UNIT/GM OP OINT: 1.0000 "application " | TOPICAL_OINTMENT | Freq: Three times a day (TID) | OPHTHALMIC | 0 refills | Status: DC

## 2016-09-20 MED ORDER — GATIFLOXACIN 0.5 % OP SOLN: 1.0000 [drp] | Freq: Four times a day (QID) | OPHTHALMIC | Status: DC

## 2016-09-20 NOTE — Progress Notes (Signed)
09/20/2016, 6:23 AM  Mental Status:  Awake, Alert, Oriented  Anterior segment: Cornea  Clear    Anterior Chamber Clear    Lens:    IOL  Intra Ocular Pressure 26 mmHg with Tonopen  Vitreous: Clear 95%gas bubble   Retina:  Attached Good laser reaction   Impression: Excellent result Retina attached   Final Diagnosis: Principal Problem:   Macular hole, right Active Problems:   Macular hole, right eye   Plan: start post operative eye drops.  Add glaucoma drops.   Discharge to home.  Give post operative instructions  Peter Wood 09/20/2016, 6:23 AM

## 2016-09-20 NOTE — Op Note (Signed)
NAME:  QUANG, KUBES                   ACCOUNT NO.:  MEDICAL RECORD NO.:  [id number]  LOCATION:                                 FACILITY:  PHYSICIAN:  Quida Glasser D. Zigmund Daniel, M.D.      DATE OF BIRTH:  DATE OF PROCEDURE:  09/19/2016 DATE OF DISCHARGE:                              OPERATIVE REPORT   ADMISSION DIAGNOSIS:  Macular hole, right eye.  PROCEDURE:  Repair of macular hole with pars plana vitrectomy, retinal photocoagulation, membrane peel, ILM peel, gas-fluid exchange, serum patch all in the right eye.  SURGEON:  Tempie Hoist, M.D.  ASSISTANT:  Deatra Ina SA.  ANESTHESIA:  General.  DETAILS:  Usual prep and drape.  Indirect ophthalmoscope laser was moved into place.  685 burns were placed around the retinal periphery in weak areas of the retina.  The power was 600 mW, 1000 microns each and 0.1 seconds each.  Attention was then carried to the pars plana area where a 3-layered incision was made through the conjunctiva and the sclera in the choroid into the anterior chamber at 2 o'clock.  25-gauge trocars placed at 8 o'clock and 10 o'clock.  Contact lens ring anchored into place at 6 and 12.  Provisc placed on the corneal surface, and the flat contact lens was placed.  Pars plana vitrectomy was begun just behind the pseudophakos.  The vitrectomy was carried posteriorly down in a core fashion down to the macular surface where the macular hole was present. The edges of the hole were yellow.  Once a core vitrectomy was completed and all white membranes were removed, silicone tip suction line was drawn and brought into the eye.  The fish-strike sign occurred, and the posterior hyaloid was lifted from its attachments of the hole.  Central operculum was seen in the detached posterior hyaloid.  The cutter was repositioned in the eye, and the posterior hyaloid was removed.  The vitrectomy was carried into the mid periphery where surface proliferation was removed.  It was carried  into the far periphery with it.  Prismatic contact lens for viewing the vitrectomy was carried into the far periphery for 360 degrees with movement of the contact lens. All vitreous was removed.  Attention was carried to the macular hole where the diamond-dusted membrane scraper was used to engage the internal limiting membrane and for 360 degrees around the macular hole. The edges of the hole were allowed to fall together.  The vitreous cutter was used to remove these ILM fragments.  Approximately 1 disc diameter of ILM was removed.  A total gas fluid exchange was carried out.  Additional fluid was removed.  Sufficient time was allowed for additional fluid to track down the walls of the eye, collected in the posterior segment.  The serum patch was prepared, and C3F8 was prepared to a 14% concentration.  Additional fluid was removed from the posterior pole with the [place] ophthalmics brush.  Serum patch was delivered. Extra serum was removed with a [place] [facility] brush.  C3F8 mixture was exchanged for intravitreal gas.  The instruments were removed from the eye.  A 9-0 nylon was used to close the sclerotomy site  at 2 o'clock. The 25-gauge trocars were removed.  The wounds were tested and found to be secure.  The conjunctiva was closed with wet-field cautery. Polymyxin and ceftazidime were rinsed around the globe for antibiotic coverage.  Decadron 10 mg was injected into the lower subconjunctival space.  Marcaine was injected around the globe for postop pain. Atropine solution was applied.  Polysporin ophthalmic ointment, a patch and shield were placed.  Closing pressure was 10 with [place] tonometer.  COMPLICATIONS:  None.  DURATION:  1 hour 15 minutes.     Chrystie Nose. Zigmund Daniel, M.D.     JDM/MEDQ  D:  09/19/2016  T:  09/20/2016  Job:  [id number]

## 2016-09-20 NOTE — Discharge Summary (Signed)
Discharge summary not needed on [facility] patients per medical records. 

## 2016-09-20 NOTE — Progress Notes (Signed)
Discharge instructions reviewed with pt and pt's wife and pt had eye bag with all medications and eye shield.  Pt and pt's wife verbalized understanding and questions answered.  Pt discharged in stable condition via wheelchair with wife.  Eliezer Bottom [place]

## 2016-09-22 ENCOUNTER — Encounter: Payer: Self-pay | Admitting: Gastroenterology

## 2016-09-22 ENCOUNTER — Telehealth: Payer: Self-pay

## 2016-09-22 NOTE — Telephone Encounter (Signed)
Pt is on TCM list. DC'ed on 09/20/2016 after eye surgery to right eye due to macular hole. Pt to follow up with ophthalmologist.

## 2016-09-25 ENCOUNTER — Encounter: Payer: Self-pay | Admitting: Gastroenterology

## 2016-09-26 ENCOUNTER — Encounter (INDEPENDENT_AMBULATORY_CARE_PROVIDER_SITE_OTHER): Payer: Medicare Other | Admitting: Ophthalmology

## 2016-09-26 DIAGNOSIS — H35341 Macular cyst, hole, or pseudohole, right eye: Secondary | ICD-10-CM

## 2016-10-18 ENCOUNTER — Encounter (INDEPENDENT_AMBULATORY_CARE_PROVIDER_SITE_OTHER): Payer: Medicare Other | Admitting: Ophthalmology

## 2016-10-18 DIAGNOSIS — H35341 Macular cyst, hole, or pseudohole, right eye: Secondary | ICD-10-CM

## 2016-12-27 ENCOUNTER — Encounter (INDEPENDENT_AMBULATORY_CARE_PROVIDER_SITE_OTHER): Payer: Medicare Other | Admitting: Ophthalmology

## 2016-12-27 DIAGNOSIS — H2512 Age-related nuclear cataract, left eye: Secondary | ICD-10-CM | POA: Diagnosis not present

## 2016-12-27 DIAGNOSIS — H35341 Macular cyst, hole, or pseudohole, right eye: Secondary | ICD-10-CM | POA: Diagnosis not present

## 2016-12-27 DIAGNOSIS — I10 Essential (primary) hypertension: Secondary | ICD-10-CM

## 2016-12-27 DIAGNOSIS — H43812 Vitreous degeneration, left eye: Secondary | ICD-10-CM

## 2016-12-27 DIAGNOSIS — H35033 Hypertensive retinopathy, bilateral: Secondary | ICD-10-CM

## 2017-01-16 ENCOUNTER — Ambulatory Visit (INDEPENDENT_AMBULATORY_CARE_PROVIDER_SITE_OTHER): Payer: Medicare Other | Admitting: Internal Medicine

## 2017-01-16 ENCOUNTER — Encounter: Payer: Self-pay | Admitting: Internal Medicine

## 2017-01-16 DIAGNOSIS — C61 Malignant neoplasm of prostate: Secondary | ICD-10-CM | POA: Diagnosis not present

## 2017-01-16 DIAGNOSIS — I739 Peripheral vascular disease, unspecified: Secondary | ICD-10-CM | POA: Diagnosis not present

## 2017-01-16 DIAGNOSIS — Z86010 Personal history of colonic polyps: Secondary | ICD-10-CM | POA: Diagnosis not present

## 2017-01-16 DIAGNOSIS — I10 Essential (primary) hypertension: Secondary | ICD-10-CM | POA: Diagnosis not present

## 2017-01-16 MED ORDER — AMLODIPINE BESYLATE 5 MG PO TABS: 5.0000 mg | ORAL_TABLET | Freq: Every day | ORAL | 3 refills | Status: DC

## 2017-01-16 MED ORDER — BENAZEPRIL HCL 40 MG PO TABS: 40.0000 mg | ORAL_TABLET | Freq: Every day | ORAL | 3 refills | Status: DC

## 2017-01-16 NOTE — Assessment & Plan Note (Addendum)
F/u w/Urology at [facility] q 12 mo w/PSAs

## 2017-01-16 NOTE — Patient Instructions (Signed)
[facility] w/Jill 

## 2017-01-16 NOTE — Progress Notes (Signed)
Pre visit review using our clinic review tool, if applicable. No additional management support is needed unless otherwise documented below in the visit note. 

## 2017-01-16 NOTE — Assessment & Plan Note (Signed)
mild per Home Visit RN -- PAD Will order art doppler

## 2017-01-16 NOTE — Assessment & Plan Note (Addendum)
Monitor BP at home  on Norvasc, Benazepril

## 2017-01-16 NOTE — Assessment & Plan Note (Signed)
He is due a colonoscopy in 2018

## 2017-01-16 NOTE — Progress Notes (Signed)
Subjective:  Patient ID: Peter Wood, male    DOB: 12/05/1934  Age: 82 y.o. MRN: [id number]  CC: No chief complaint on file.   HPI Peter Wood presents for HTN, prostate ca. RN home visit - was told he has a poor circulation in the legs  Outpatient Medications Prior to Visit  Medication Sig Dispense Refill  . amLODipine (NORVASC) 5 MG tablet TAKE 1 TABLET BY MOUTH ONCE A DAY 90 tablet 3  . benazepril (LOTENSIN) 40 MG tablet TAKE 1 TABLET BY MOUTH ONCE A DAY 90 tablet 2  . brimonidine (ALPHAGAN) 0.2 % ophthalmic solution Place 1 drop into the right eye 2 (two) times daily. 5 mL 12  . Cholecalciferol 1000 UNITS tablet Take 1,000 Units by mouth daily.     . bacitracin-polymyxin b (POLYSPORIN) ophthalmic ointment Place 1 application into the right eye 3 (three) times daily. apply to eye every 12 hours while awake 3.5 g 0  . dorzolamide (TRUSOPT) 2 % ophthalmic solution Place 1 drop into the right eye 3 (three) times daily. 10 mL 12  . gatifloxacin (ZYMAXID) 0.5 % SOLN Place 1 drop into the right eye 4 (four) times daily.    . prednisoLONE acetate (PRED FORTE) 1 % ophthalmic suspension Place 1 drop into the right eye 4 (four) times daily. 5 mL 0   No facility-administered medications prior to visit.     ROS Review of Systems  Constitutional: Negative for appetite change, fatigue and unexpected weight change.  HENT: Negative for congestion, nosebleeds, sneezing, sore throat and trouble swallowing.   Eyes: Negative for itching and visual disturbance.  Respiratory: Negative for cough.   Cardiovascular: Negative for chest pain, palpitations and leg swelling.  Gastrointestinal: Negative for abdominal distention, blood in stool, diarrhea and nausea.  Genitourinary: Negative for frequency and hematuria.  Musculoskeletal: Negative for back pain, gait problem, joint swelling and neck pain.  Skin: Negative for rash.  Neurological: Negative for dizziness, tremors, speech difficulty and  weakness.  Psychiatric/Behavioral: Negative for agitation, dysphoric mood and sleep disturbance. The patient is not nervous/anxious.     Objective:  BP (!) 148/90 (BP Location: Right Arm, Patient Position: Sitting, Cuff Size: Normal)   Pulse 63   Temp 98.1 F (36.7 C) (Oral)   Ht 5\' 7"  (1.702 m)   Wt 165 lb 1.9 oz (74.9 kg)   SpO2 100%   BMI 25.86 kg/m   BP Readings from Last 3 Encounters:  01/16/17 (!) 148/90  09/20/16 134/88  07/19/16 135/89    Wt Readings from Last 3 Encounters:  01/16/17 165 lb 1.9 oz (74.9 kg)  07/19/16 167 lb (75.8 kg)  12/22/15 165 lb (74.8 kg)    Physical Exam  Constitutional: He is oriented to person, place, and time. He appears well-developed. No distress.  NAD  HENT:  Mouth/Throat: Oropharynx is clear and moist.  Eyes: Conjunctivae are normal. Pupils are equal, round, and reactive to light.  Neck: Normal range of motion. No JVD present. No thyromegaly present.  Cardiovascular: Normal rate, regular rhythm, normal heart sounds and intact distal pulses.  Exam reveals no gallop and no friction rub.   No murmur heard. Pulmonary/Chest: Effort normal and breath sounds normal. No respiratory distress. He has no wheezes. He has no rales. He exhibits no tenderness.  Abdominal: Soft. Bowel sounds are normal. He exhibits no distension and no mass. There is no tenderness. There is no rebound and no guarding.  Musculoskeletal: Normal range of motion. He exhibits no  edema or tenderness.  Lymphadenopathy:    He has no cervical adenopathy.  Neurological: He is alert and oriented to person, place, and time. He has normal reflexes. No cranial nerve deficit. He exhibits normal muscle tone. He displays a negative Romberg sign. Coordination and gait normal.  Skin: Skin is warm and dry. No rash noted.  Psychiatric: He has a normal mood and affect. His behavior is normal. Judgment and thought content normal.    Lab Results  Component Value Date   WBC 4.3 09/19/2016    HGB 13.0 09/19/2016   HCT 40.2 09/19/2016   PLT 200 09/19/2016   GLUCOSE 97 09/19/2016   CHOL 191 07/19/2016   TRIG 91.0 07/19/2016   HDL 49.30 07/19/2016   LDLDIRECT 126.3 09/23/2007   LDLCALC 123 (H) 07/19/2016   ALT 21 07/19/2016   AST 21 07/19/2016   NA 139 09/19/2016   K 3.6 09/19/2016   CL 104 09/19/2016   CREATININE 1.04 09/19/2016   BUN 13 09/19/2016   CO2 27 09/19/2016   TSH 0.86 07/19/2016   PSA 6.26 (H) 03/11/2012   PSA 6.12 (H) 03/11/2012    No results found.  Assessment & Plan:   There are no diagnoses linked to this encounter. I have discontinued Mr. Ernsberger bacitracin-polymyxin b, dorzolamide, gatifloxacin, and prednisoLONE acetate. I am also having him maintain his Cholecalciferol, amLODipine, benazepril, and brimonidine.  No orders of the defined types were placed in this encounter.    Follow-up: No Follow-up on file.  Walker Kehr, MD

## 2017-01-17 ENCOUNTER — Telehealth: Payer: Self-pay | Admitting: Internal Medicine

## 2017-01-17 DIAGNOSIS — I739 Peripheral vascular disease, unspecified: Secondary | ICD-10-CM

## 2017-01-17 NOTE — Telephone Encounter (Signed)
Needs order for ABI to be entered for patient.

## 2017-01-18 ENCOUNTER — Encounter: Payer: Self-pay | Admitting: Gastroenterology

## 2017-01-18 NOTE — Telephone Encounter (Signed)
done

## 2017-02-02 ENCOUNTER — Encounter (HOSPITAL_COMMUNITY): Payer: Medicare Other

## 2017-02-07 ENCOUNTER — Ambulatory Visit (HOSPITAL_COMMUNITY)
Admission: RE | Admit: 2017-02-07 | Discharge: 2017-02-07 | Disposition: A | Discharge status: Home / Self Care | Payer: Medicare Other | Source: Ambulatory Visit | Attending: Internal Medicine | Admitting: Internal Medicine

## 2017-02-07 ENCOUNTER — Encounter (HOSPITAL_COMMUNITY): Payer: Self-pay

## 2017-02-07 DIAGNOSIS — I739 Peripheral vascular disease, unspecified: Secondary | ICD-10-CM | POA: Insufficient documentation

## 2017-02-09 NOTE — Addendum Note (Signed)
Addendum  created 02/09/17 0071 by Rica Koyanagi, MD   Sign clinical note

## 2017-02-26 ENCOUNTER — Ambulatory Visit (INDEPENDENT_AMBULATORY_CARE_PROVIDER_SITE_OTHER): Payer: Medicare Other | Admitting: Gastroenterology

## 2017-02-26 ENCOUNTER — Encounter: Payer: Self-pay | Admitting: Gastroenterology

## 2017-02-26 VITALS — BP 124/86 | HR 72 | Ht 65.0 in | Wt 162.0 lb

## 2017-02-26 DIAGNOSIS — Z86010 Personal history of colonic polyps: Secondary | ICD-10-CM

## 2017-02-26 MED ORDER — NA SULFATE-K SULFATE-MG SULF 17.5-3.13-1.6 GM/177ML PO SOLN: 1.0000 | Freq: Once | ORAL | 0 refills | Status: AC

## 2017-02-26 NOTE — Progress Notes (Signed)
Review of pertinent gastrointestinal problems: 1. Adenomatous colon polyps: Colonoscopy Dr. Maryanna Shape 2002  found 1 small tubular adenoma. Colonoscopy 2006 Dr. Maryanna Shape found no polyps. He was recommended to have repeat colonoscopy at "7 year interval." Repeat colonoscopy 2013 Dr. Ardis Hughs found 2 subCM tubular adenomas. Recommended recall at 5 year interval.       HPI: This is a  very pleasant 82 year old man  Whom I last saw about 5 years ago. See the results of that colonoscopy above  Chief complaint is personal history of precancerous colon polyps  No FH of colon cancer  Has lost 5 pounds recently.  Semi-intentionally  No issues with GI tract. No changes in his bowels, no overt bleeding, no significant abdominal pains     Old Data Reviewed: See above   Review of systems: Pertinent positive and negative review of systems were noted in the above HPI section. All other review negative.   Past Medical History:  Diagnosis Date  . Arthritis   . Diverticulosis of colon   . HTN (hypertension)   . Hx of colonic polyp   . Pneumonia    as a child/baby  . Poor circulation of extremity    right leg  . Prostate cancer [facility] 2015   prostate cancer - radiation treated    Past Surgical History:  Procedure Laterality Date  . [place] VITRECTOMY WITH 20 GAUGE MVR PORT FOR MACULAR HOLE Right 09/19/2016   Procedure: 25 GAUGE PARS PLANA VITRECTOMY WITH 20 GAUGE MVR PORT FOR MACULAR HOLE, MEMBRANE PEEL, SERUM PATCH, GAS FLUID EXCHANGE, HEADSCOPE LASER;  Surgeon: [name], MD;  Location: [place];  Service: Ophthalmology;  Laterality: Right;  . CATARACT EXTRACTION  08/27/12   Right  . COLONOSCOPY    . HAND SURGERY  2016   right thumb  . KNEE SURGERY Left 1989    Current Outpatient Prescriptions  Medication Sig Dispense Refill  . amLODipine (NORVASC) 5 MG tablet Take 1 tablet (5 mg total) by mouth daily. 90 tablet 3  . benazepril (LOTENSIN) 40 MG tablet Take 1 tablet (40 mg  total) by mouth daily. 90 tablet 3  . brimonidine (ALPHAGAN) 0.2 % ophthalmic solution Place 1 drop into the right eye 2 (two) times daily. 5 mL 12  . Cholecalciferol 1000 UNITS tablet Take 1,000 Units by mouth daily.      No current facility-administered medications for this visit.     Allergies as of 02/26/2017 - Review Complete 02/26/2017  Allergen Reaction Noted  . No known allergies  09/18/2016    Family History  Problem Relation Age of Onset  . Prostate cancer Father   . Hypertension Mother     Social History   Social History  . Marital status: Married    Spouse name: N/A  . Number of children: 4  . Years of education: N/A   Occupational History  . retired    Social History Main Topics  . Smoking status: Former Smoker    Years: 4.00    Quit date: 09/12/1967  . Smokeless tobacco: Never Used  . Alcohol use No  . Drug use: No  . Sexual activity: Not on file   Other Topics Concern  . Not on file   Social History Narrative  . No narrative on file     Physical Exam: Ht 5\' 5"  (1.651 m) Comment: Height measured without shoes  Wt 162 lb (73.5 kg)   BMI 26.96 kg/m  Constitutional: generally well-appearing  Psychiatric: alert and oriented x3 Eyes: extraocular movements intact Mouth: oral pharynx moist, no lesions Neck: supple no lymphadenopathy Cardiovascular: heart regular rate and rhythm Lungs: clear to auscultation bilaterally Abdomen: soft, nontender, nondistended, no obvious ascites, no peritoneal signs, normal bowel sounds Extremities: no lower extremity edema bilaterally Skin: no lesions on visible extremities   Assessment and plan: 82 y.o. male with  Personal history of precancerous colon polyps  we had a nice discussion with his wife in the room about the nature of colon cancer screening, polyp surveillance guidelines. He understands that these screening, surveillance test generally and some time between the ages of 12 and 54. He is in good physical  health mentally alert gets around very well and I think polyp surveillance is still a reasonable clinical issue for him. He agrees and he is interested in repeat colonoscopy. We will therefore schedule colonoscopy to be performed at his soonest convenience. I see no reason for any further blood tests or imaging studies prior to then.    Please see the "Patient Instructions" section for addition details about the plan.   Owens Loffler, MD [facility] 02/26/2017, 10:15 AM  Cc: Cassandria Anger, MD

## 2017-02-26 NOTE — Patient Instructions (Addendum)
You will be set up for a colonoscopy for polyp surveillance.  Normal BMI (Body Mass Index- based on height and weight) is between 23 and 30. Your BMI today is Body mass index is 26.96 kg/m. Marland Kitchen Please consider follow up  regarding your BMI with your Primary Care Provider.

## 2017-04-09 ENCOUNTER — Encounter: Payer: Self-pay | Admitting: Gastroenterology

## 2017-04-23 ENCOUNTER — Encounter: Payer: Self-pay | Admitting: Gastroenterology

## 2017-04-23 ENCOUNTER — Ambulatory Visit (AMBULATORY_SURGERY_CENTER): Payer: Medicare Other | Admitting: Gastroenterology

## 2017-04-23 VITALS — BP 118/64 | HR 51 | Temp 98.4°F | Resp 25 | Ht 65.0 in | Wt 162.0 lb

## 2017-04-23 DIAGNOSIS — K5730 Diverticulosis of large intestine without perforation or abscess without bleeding: Secondary | ICD-10-CM

## 2017-04-23 DIAGNOSIS — Z86010 Personal history of colonic polyps: Secondary | ICD-10-CM | POA: Diagnosis not present

## 2017-04-23 MED ORDER — SODIUM CHLORIDE 0.9 % IV SOLN: 500.0000 mL | INTRAVENOUS | Status: DC

## 2017-04-23 NOTE — Patient Instructions (Signed)
   INFORMATION ON DIVERTICULOSIS GIVEN TO YOU TODAY   YOU HAD AN ENDOSCOPIC PROCEDURE TODAY AT THE [facility]:   Refer to the procedure report that was given to you for any specific questions about what was found during the examination.  If the procedure report does not answer your questions, please call your gastroenterologist to clarify.  If you requested that your care partner not be given the details of your procedure findings, then the procedure report has been included in a sealed envelope for you to review at your convenience later.  YOU SHOULD EXPECT: Some feelings of bloating in the abdomen. Passage of more gas than usual.  Walking can help get rid of the air that was put into your GI tract during the procedure and reduce the bloating. If you had a lower endoscopy (such as a colonoscopy or flexible sigmoidoscopy) you may notice spotting of blood in your stool or on the toilet paper. If you underwent a bowel prep for your procedure, you may not have a normal bowel movement for a few days.  Please Note:  You might notice some irritation and congestion in your nose or some drainage.  This is from the oxygen used during your procedure.  There is no need for concern and it should clear up in a day or so.  SYMPTOMS TO REPORT IMMEDIATELY:   Following lower endoscopy (colonoscopy or flexible sigmoidoscopy):  Excessive amounts of blood in the stool  Significant tenderness or worsening of abdominal pains  Swelling of the abdomen that is new, acute  Fever of 100F or higher    For urgent or emergent issues, a gastroenterologist can be reached at any hour by calling [phone].   DIET:  We do recommend a small meal at first, but then you may proceed to your regular diet.  Drink plenty of fluids but you should avoid alcoholic beverages for 24 hours.  ACTIVITY:  You should plan to take it easy for the rest of today and you should NOT DRIVE or use heavy machinery until tomorrow  (because of the sedation medicines used during the test).    FOLLOW UP: Our staff will call the number listed on your records the next business day following your procedure to check on you and address any questions or concerns that you may have regarding the information given to you following your procedure. If we do not reach you, we will leave a message.  However, if you are feeling well and you are not experiencing any problems, there is no need to return our call.  We will assume that you have returned to your regular daily activities without incident.  If any biopsies were taken you will be contacted by phone or by letter within the next 1-3 weeks.  Please call us at [phone] if you have not heard about the biopsies in 3 weeks.    SIGNATURES/CONFIDENTIALITY: You and/or your care partner have signed paperwork which will be entered into your electronic medical record.  These signatures attest to the fact that that the information above on your After Visit Summary has been reviewed and is understood.  Full responsibility of the confidentiality of this discharge information lies with you and/or your care-partner. 

## 2017-04-23 NOTE — Op Note (Signed)
[facility] Patient Name: Peter Wood Procedure Date: 04/23/2017 1:41 PM MRN: [id number] Endoscopist: Milus Banister , MD Age: 82 Referring MD:  Date of Birth: 11-02-1934 Gender: Male Account #: [id number] Procedure:                Colonoscopy Indications:              High risk colon cancer surveillance: Personal                            history of colonic polyps Adenomatous colon polyps:                            Colonoscopy Dr. Velora Heckler 2025found 1 small                            tubular adenoma. Colonoscopy 2006 Dr.                            Maureen Ralphs no polyps. He was recommended to                            have repeat colonoscopy at "7 year                            interval."Repeat colonoscopy 2013 Dr. Malena Peer                            2 subCMtubular adenomas. Recommended recall at 5                            year interval. Medicines:                Monitored Anesthesia Care Procedure:                Pre-Anesthesia Assessment:                           - Prior to the procedure, a History and Physical                            was performed, and patient medications and                            allergies were reviewed. The patient's tolerance of                            previous anesthesia was also reviewed. The risks                            and benefits of the procedure and the sedation                            options and risks were discussed with the patient.  All questions were answered, and informed consent                            was obtained. Prior Anticoagulants: The patient has                            taken no previous anticoagulant or antiplatelet                            agents. ASA Grade Assessment: II - A patient with                            mild systemic disease. After reviewing the risks                            and benefits, the patient was deemed in           satisfactory condition to undergo the procedure.                           After obtaining informed consent, the colonoscope                            was passed under direct vision. Throughout the                            procedure, the patient's blood pressure, pulse, and                            oxygen saturations were monitored continuously. The                            Model CF-HQ190L [id number]) scope was introduced                            through the anus and advanced to the the cecum,                            identified by appendiceal orifice and ileocecal                            valve. The colonoscopy was performed without                            difficulty. The patient tolerated the procedure                            well. The quality of the bowel preparation was                            good. The ileocecal valve, appendiceal orifice, and                            rectum were photographed. Scope In: 1:44:18 PM Scope Out:  1:53:53 PM Scope Withdrawal Time: 0 hours 7 minutes 35 seconds  Total Procedure Duration: 0 hours 9 minutes 35 seconds  Findings:                 Multiple small and large-mouthed diverticula were                            found in the left colon.                           The exam was otherwise without abnormality on                            direct and retroflexion views. Complications:            No immediate complications. Estimated blood loss:                            None. Estimated Blood Loss:     Estimated blood loss: none. Impression:               - Diverticulosis in the left colon.                           - The examination was otherwise normal on direct                            and retroflexion views.                           - No polyps or cancers. Recommendation:           - Patient has a contact number available for                            emergencies. The signs and symptoms of potential                             delayed complications were discussed with the                            patient. Return to normal activities tomorrow.                            Written discharge instructions were provided to the                            patient.                           - Resume previous diet.                           - Continue present medications.                           - You do not need any further colon cancer  screening tests (including stool testing). These                            types of tests generally stop around age 22-80. Milus Banister, MD 04/23/2017 1:56:35 PM This report has been signed electronically.

## 2017-04-23 NOTE — Progress Notes (Signed)
Report to PACU, RN, vss, BBS= Clear.  

## 2017-04-24 ENCOUNTER — Telehealth: Payer: Self-pay

## 2017-04-24 NOTE — Telephone Encounter (Signed)
  Follow up Call-  Call back number 04/23/2017  Post procedure Call Back phone  # [phone]  Permission to leave phone message No  Some recent data might be hidden     Patient questions:  Do you have a fever, pain , or abdominal swelling? No. Pain Score  0 *  Have you tolerated food without any problems? Yes.    Have you been able to return to your normal activities? Yes.    Do you have any questions about your discharge instructions: Diet   No. Medications  No. Follow up visit  No.  Do you have questions or concerns about your Care? No.  Actions: * If pain score is 4 or above: No action needed, pain <4.

## 2017-06-29 ENCOUNTER — Ambulatory Visit (INDEPENDENT_AMBULATORY_CARE_PROVIDER_SITE_OTHER): Payer: Medicare Other | Admitting: Ophthalmology

## 2017-06-29 DIAGNOSIS — H35341 Macular cyst, hole, or pseudohole, right eye: Secondary | ICD-10-CM | POA: Diagnosis not present

## 2017-06-29 DIAGNOSIS — H43813 Vitreous degeneration, bilateral: Secondary | ICD-10-CM

## 2017-06-29 DIAGNOSIS — H35033 Hypertensive retinopathy, bilateral: Secondary | ICD-10-CM | POA: Diagnosis not present

## 2017-06-29 DIAGNOSIS — H2512 Age-related nuclear cataract, left eye: Secondary | ICD-10-CM | POA: Diagnosis not present

## 2017-06-29 DIAGNOSIS — I10 Essential (primary) hypertension: Secondary | ICD-10-CM

## 2017-07-20 ENCOUNTER — Other Ambulatory Visit (INDEPENDENT_AMBULATORY_CARE_PROVIDER_SITE_OTHER): Payer: Medicare Other

## 2017-07-20 ENCOUNTER — Ambulatory Visit (INDEPENDENT_AMBULATORY_CARE_PROVIDER_SITE_OTHER): Payer: Medicare Other | Admitting: Internal Medicine

## 2017-07-20 ENCOUNTER — Encounter: Payer: Self-pay | Admitting: Internal Medicine

## 2017-07-20 VITALS — BP 124/82 | HR 59 | Temp 98.5°F | Ht 65.0 in | Wt 165.0 lb

## 2017-07-20 DIAGNOSIS — Z0000 Encounter for general adult medical examination without abnormal findings: Secondary | ICD-10-CM

## 2017-07-20 DIAGNOSIS — C61 Malignant neoplasm of prostate: Secondary | ICD-10-CM | POA: Diagnosis not present

## 2017-07-20 DIAGNOSIS — Z23 Encounter for immunization: Secondary | ICD-10-CM

## 2017-07-20 LAB — URINALYSIS
Bilirubin Urine: NEGATIVE
Hgb urine dipstick: NEGATIVE
KETONES UR: NEGATIVE
LEUKOCYTES UA: NEGATIVE
NITRITE: NEGATIVE
SPECIFIC GRAVITY, URINE: 1.015 (ref 1.000–1.030)
Total Protein, Urine: NEGATIVE
UROBILINOGEN UA: 0.2 (ref 0.0–1.0)
Urine Glucose: NEGATIVE
pH: 7.5 (ref 5.0–8.0)

## 2017-07-20 LAB — HEPATIC FUNCTION PANEL
ALT: 20 U/L (ref 0–53)
AST: 23 U/L (ref 0–37)
Albumin: 4 g/dL (ref 3.5–5.2)
Alkaline Phosphatase: 54 U/L (ref 39–117)
BILIRUBIN DIRECT: 0.2 mg/dL (ref 0.0–0.3)
TOTAL PROTEIN: 7.1 g/dL (ref 6.0–8.3)
Total Bilirubin: 1 mg/dL (ref 0.2–1.2)

## 2017-07-20 LAB — LIPID PANEL
CHOL/HDL RATIO: 4
CHOLESTEROL: 190 mg/dL (ref 0–200)
HDL: 45.5 mg/dL (ref >39.00)
LDL Cholesterol: 127 mg/dL — ABNORMAL HIGH (ref 0–99)
NonHDL: 144.67
Triglycerides: 89 mg/dL (ref 0.0–149.0)
VLDL: 17.8 mg/dL (ref 0.0–40.0)

## 2017-07-20 LAB — CBC WITH DIFFERENTIAL/PLATELET
BASOS ABS: 0.1 10*3/uL (ref 0.0–0.1)
Basophils Relative: 1.2 % (ref 0.0–3.0)
EOS ABS: 0.2 10*3/uL (ref 0.0–0.7)
Eosinophils Relative: 3.9 % (ref 0.0–5.0)
HEMATOCRIT: 42.2 % (ref 39.0–52.0)
HEMOGLOBIN: 13.6 g/dL (ref 13.0–17.0)
LYMPHS PCT: 31.4 % (ref 12.0–46.0)
Lymphs Abs: 1.5 10*3/uL (ref 0.7–4.0)
MCHC: 32.2 g/dL (ref 30.0–36.0)
MCV: 97.9 fl (ref 78.0–100.0)
MONOS PCT: 12.5 % — AB (ref 3.0–12.0)
Monocytes Absolute: 0.6 10*3/uL (ref 0.1–1.0)
Neutro Abs: 2.4 10*3/uL (ref 1.4–7.7)
Neutrophils Relative %: 51 % (ref 43.0–77.0)
Platelets: 217 10*3/uL (ref 150.0–400.0)
RBC: 4.31 Mil/uL (ref 4.22–5.81)
RDW: 12.9 % (ref 11.5–15.5)
WBC: 4.8 10*3/uL (ref 4.0–10.5)

## 2017-07-20 LAB — TSH: TSH: 1.06 u[IU]/mL (ref 0.35–4.50)

## 2017-07-20 LAB — BASIC METABOLIC PANEL
BUN: 13 mg/dL (ref 6–23)
CALCIUM: 9.2 mg/dL (ref 8.4–10.5)
CHLORIDE: 101 meq/L (ref 96–112)
CO2: 30 meq/L (ref 19–32)
CREATININE: 0.98 mg/dL (ref 0.40–1.50)
GFR: 94.63 mL/min (ref >60.00)
Glucose, Bld: 94 mg/dL (ref 70–99)
Potassium: 3.7 mEq/L (ref 3.5–5.1)
Sodium: 138 mEq/L (ref 135–145)

## 2017-07-20 MED ORDER — AMLODIPINE BESYLATE 5 MG PO TABS: 5.0000 mg | ORAL_TABLET | Freq: Every day | ORAL | 3 refills | Status: DC

## 2017-07-20 MED ORDER — BENAZEPRIL HCL 40 MG PO TABS: 40.0000 mg | ORAL_TABLET | Freq: Every day | ORAL | 3 refills | Status: DC

## 2017-07-20 MED ORDER — ZOSTER VAC RECOMB ADJUVANTED 50 MCG/0.5ML IM SUSR: 0.5000 mL | Freq: Once | INTRAMUSCULAR | 1 refills | Status: AC

## 2017-07-20 NOTE — Progress Notes (Signed)
Subjective:   Peter Wood is a 82 y.o. male who presents for Medicare Annual/Subsequent preventive examination.  Review of Systems:  No ROS.  Medicare Wellness Visit. Additional risk factors are reflected in the social history.'  Cardiac Risk Factors include: advanced age (>48men, >64 women);hypertension;male gender Sleep patterns: feels rested on waking, does not get up to void and sleeps 7-9 hours nightly.    Home Safety/Smoke Alarms: Feels safe in home. Smoke alarms in place.  Living environment; residence and Firearm Safety: 2-story house, no firearms. Lives with wife no needs for DME, good support system Seat Belt Safety/Bike Helmet: Wears seat belt.      Objective:    Vitals: BP 124/82 (BP Location: Left Arm, Patient Position: Sitting, Cuff Size: Large)   Pulse (!) 59   Temp 98.5 F (36.9 C) (Oral)   Ht 5\' 5"  (1.651 m)   Wt 165 lb (74.8 kg)   SpO2 98%   BMI 27.46 kg/m   Body mass index is 27.46 kg/m.  Tobacco Social History   Tobacco Use  Smoking Status Former Smoker  . Years: 4.00  . Last attempt to quit: 09/12/1967  . Years since quitting: 49.8  Smokeless Tobacco Never Used     Counseling given: Not Answered   Past Medical History:  Diagnosis Date  . Arthritis   . Diverticulosis of colon   . HTN (hypertension)   . Hx of colonic polyp   . Pneumonia    as a child/baby  . Poor circulation of extremity    right leg  . Prostate cancer [facility]) 2015   prostate cancer - radiation treated   Past Surgical History:  Procedure Laterality Date  . CATARACT EXTRACTION  08/27/12   Right  . COLONOSCOPY    . HAND SURGERY  2016   right thumb  . KNEE SURGERY Left 1989   Family History  Problem Relation Age of Onset  . Prostate cancer Father   . Hypertension Mother   . Breast cancer Sister    Social History   Substance and Sexual Activity  Sexual Activity Not on file    Outpatient Encounter Medications as of 07/20/2017  Medication Sig  . amLODipine  (NORVASC) 5 MG tablet Take 1 tablet (5 mg total) daily by mouth.  . benazepril (LOTENSIN) 40 MG tablet Take 1 tablet (40 mg total) daily by mouth.  . brimonidine (ALPHAGAN) 0.2 % ophthalmic solution Place 1 drop into the right eye 2 (two) times daily.  . Cholecalciferol 1000 UNITS tablet Take 1,000 Units by mouth daily.   . [DISCONTINUED] amLODipine (NORVASC) 5 MG tablet Take 1 tablet (5 mg total) by mouth daily.  . [DISCONTINUED] benazepril (LOTENSIN) 40 MG tablet Take 1 tablet (40 mg total) by mouth daily.  Marland Kitchen Zoster Vaccine Adjuvanted [facility]) injection Inject 0.5 mLs once for 1 dose into the muscle. Repeat in  6 months once.   Facility-Administered Encounter Medications as of 07/20/2017  Medication  . 0.9 %  sodium chloride infusion    Activities of Daily Living In your present state of health, do you have any difficulty performing the following activities: 07/20/2017 09/19/2016  Hearing? N N  Vision? N N  Difficulty concentrating or making decisions? N N  Walking or climbing stairs? N N  Dressing or bathing? N N  Doing errands, shopping? N -  Preparing Food and eating ? N -  Using the Toilet? N -  In the past six months, have you accidently leaked urine? N -  Do you have problems with loss of bowel control? N -  Managing your Medications? N -  Managing your Finances? N -  Housekeeping or managing your Housekeeping? N -  Some recent data might be hidden    Patient Care Team: Plotnikov, Evie Lacks, MD as PCP - General Milus Banister, MD as Attending Physician (Gastroenterology)   Assessment:    Physical assessment deferred to PCP.  Exercise Activities and Dietary recommendations Current Exercise Habits: Home exercise routine, Type of exercise: calisthenics;strength training/weights;stretching;walking, Time (Minutes): 60, Frequency (Times/Week): 4, Weekly Exercise (Minutes/Week): 240, Intensity: Mild, Exercise limited by: None identified  Diet (meal preparation, eat out,  water intake, caffeinated beverages, dairy products, fruits and vegetables): in general, a "healthy" diet  , well balanced, on average, 2 meals per day, eats a variety of fruits and vegetables daily, limits salt, fat/cholesterol, sugar, caffeine, drinks 6-8 glasses of water daily.  Reviewed heart healthy  Diet and discussed eating 3 meals daily.    Goals    None     Fall Risk Fall Risk  07/20/2017 07/19/2016 06/24/2015  Falls in the past year? No No No   Depression Screen PHQ 2/9 Scores 07/20/2017 07/19/2016 06/24/2015  PHQ - 2 Score 0 0 0  PHQ- 9 Score 0 - -    Cognitive Function MMSE - Mini Mental State Exam 07/20/2017  Orientation to time 5  Orientation to Place 5  Registration 3  Attention/ Calculation 5  Recall 2  Language- name 2 objects 2  Language- repeat 1  Language- follow 3 step command 3  Language- read & follow direction 1  Write a sentence 1  Copy design 1  Total score 29        Immunization History  Administered Date(s) Administered  . Influenza, High Dose Seasonal PF 07/19/2016  . Pneumococcal Conjugate-13 12/21/2014  . Pneumococcal Polysaccharide-23 06/19/2014  . Td 09/12/2011   Screening Tests Health Maintenance  Topic Date Due  . INFLUENZA VACCINE  04/11/2017  . TETANUS/TDAP  09/11/2021  . PNA vac Low Risk Adult  Completed      Plan:  Continue doing brain stimulating activities (puzzles, reading, adult coloring books, staying active) to keep memory sharp.   Continue to eat heart healthy diet (full of fruits, vegetables, whole grains, lean protein, water--limit salt, fat, and sugar intake) and increase physical activity as tolerated.  I have personally reviewed and noted the following in the patient's chart:   . Medical and social history . Use of alcohol, tobacco or illicit drugs  . Current medications and supplements . Functional ability and status . Nutritional status . Physical activity . Advanced directives . List of other  physicians . Vitals . Screenings to include cognitive, depression, and falls . Referrals and appointments  In addition, I have reviewed and discussed with patient certain preventive protocols, quality metrics, and best practice recommendations. A written personalized care plan for preventive services as well as general preventive health recommendations were provided to patient.     Michiel Cowboy, RN  07/20/2017  Medical screening examination/treatment/procedure(s) were performed by non-physician practitioner and as supervising physician I was immediately available for consultation/collaboration. I agree with above. Lew Dawes, MD

## 2017-07-20 NOTE — Patient Instructions (Addendum)
Continue doing brain stimulating activities (puzzles, reading, adult coloring books, staying active) to keep memory sharp.   Continue to eat heart healthy diet (full of fruits, vegetables, whole grains, lean protein, water--limit salt, fat, and sugar intake) and increase physical activity as tolerated.  Peter Wood , Thank you for taking time to come for your Medicare Wellness Visit. I appreciate your ongoing commitment to your health goals. Please review the following plan we discussed and let me know if I can assist you in the future.   These are the goals we discussed: Goals    None      This is a list of the screening recommended for you and due dates: Continue to eat as healthy and stay active, enjoy life and family. I will wake up earlier so I can eat 3 meals daily.  Health Maintenance  Topic Date Due  . Flu Shot  04/11/2017  . Tetanus Vaccine  09/11/2021  . Pneumonia vaccines  Completed

## 2017-07-20 NOTE — Assessment & Plan Note (Signed)
  shingrix Rx Flu shot Ophth q 12 mo - Dr Kathlen Mody

## 2017-07-20 NOTE — Assessment & Plan Note (Addendum)
PSA per Urology 

## 2017-07-20 NOTE — Progress Notes (Signed)
Subjective:  Patient ID: Peter Wood, male    DOB: 1935-08-27  Age: 82 y.o. MRN: [id number]  CC: No chief complaint on file.   HPI Peter Wood presents for a well exam F/u HTN  Outpatient Medications Prior to Visit  Medication Sig Dispense Refill  . amLODipine (NORVASC) 5 MG tablet Take 1 tablet (5 mg total) by mouth daily. 90 tablet 3  . benazepril (LOTENSIN) 40 MG tablet Take 1 tablet (40 mg total) by mouth daily. 90 tablet 3  . brimonidine (ALPHAGAN) 0.2 % ophthalmic solution Place 1 drop into the right eye 2 (two) times daily. 5 mL 12  . Cholecalciferol 1000 UNITS tablet Take 1,000 Units by mouth daily.      Facility-Administered Medications Prior to Visit  Medication Dose Route Frequency Provider Last Rate Last Dose  . 0.9 %  sodium chloride infusion  500 mL Intravenous Continuous Milus Banister, MD        ROS Review of Systems  Constitutional: Negative for appetite change, fatigue and unexpected weight change.  HENT: Negative for congestion, nosebleeds, sneezing, sore throat and trouble swallowing.   Eyes: Negative for itching and visual disturbance.  Respiratory: Negative for cough.   Cardiovascular: Negative for chest pain, palpitations and leg swelling.  Gastrointestinal: Negative for abdominal distention, blood in stool, diarrhea and nausea.  Genitourinary: Positive for urgency. Negative for frequency and hematuria.  Musculoskeletal: Negative for back pain, gait problem, joint swelling and neck pain.  Skin: Negative for rash.  Neurological: Negative for dizziness, tremors, speech difficulty and weakness.  Psychiatric/Behavioral: Negative for agitation, dysphoric mood and sleep disturbance. The patient is not nervous/anxious.     Objective:  BP 124/82 (BP Location: Left Arm, Patient Position: Sitting, Cuff Size: Large)   Pulse (!) 59   Temp 98.5 F (36.9 C) (Oral)   Ht 5\' 5"  (1.651 m)   Wt 165 lb (74.8 kg)   SpO2 98%   BMI 27.46 kg/m   BP  Readings from Last 3 Encounters:  07/20/17 124/82  04/23/17 118/64  02/26/17 124/86    Wt Readings from Last 3 Encounters:  07/20/17 165 lb (74.8 kg)  04/23/17 162 lb (73.5 kg)  02/26/17 162 lb (73.5 kg)    Physical Exam  Constitutional: He is oriented to person, place, and time. He appears well-developed. No distress.  NAD  HENT:  Mouth/Throat: Oropharynx is clear and moist.  Eyes: Conjunctivae are normal. Pupils are equal, round, and reactive to light.  Neck: Normal range of motion. No JVD present. No thyromegaly present.  Cardiovascular: Normal rate, regular rhythm, normal heart sounds and intact distal pulses. Exam reveals no gallop and no friction rub.  No murmur heard. Pulmonary/Chest: Effort normal and breath sounds normal. No respiratory distress. He has no wheezes. He has no rales. He exhibits no tenderness.  Abdominal: Soft. Bowel sounds are normal. He exhibits no distension and no mass. There is no tenderness. There is no rebound and no guarding.  Musculoskeletal: Normal range of motion. He exhibits no edema or tenderness.  Lymphadenopathy:    He has no cervical adenopathy.  Neurological: He is alert and oriented to person, place, and time. He has normal reflexes. No cranial nerve deficit. He exhibits normal muscle tone. He displays a negative Romberg sign. Coordination and gait normal.  Skin: Skin is warm and dry. No rash noted.  Psychiatric: He has a normal mood and affect. His behavior is normal. Judgment and thought content normal.    Lab  Results  Component Value Date   WBC 4.3 09/19/2016   HGB 13.0 09/19/2016   HCT 40.2 09/19/2016   PLT 200 09/19/2016   GLUCOSE 97 09/19/2016   CHOL 191 07/19/2016   TRIG 91.0 07/19/2016   HDL 49.30 07/19/2016   LDLDIRECT 126.3 09/23/2007   LDLCALC 123 (H) 07/19/2016   ALT 21 07/19/2016   AST 21 07/19/2016   NA 139 09/19/2016   K 3.6 09/19/2016   CL 104 09/19/2016   CREATININE 1.04 09/19/2016   BUN 13 09/19/2016   CO2  27 09/19/2016   TSH 0.86 07/19/2016   PSA 6.26 (H) 03/11/2012   PSA 6.12 (H) 03/11/2012    No results found.  Assessment & Plan:   There are no diagnoses linked to this encounter. I am having Jojuan L. Ferrara maintain his Cholecalciferol, brimonidine, benazepril, and amLODipine. We will continue to administer sodium chloride.  No orders of the defined types were placed in this encounter.    Follow-up: No Follow-up on file.  Walker Kehr, MD

## 2017-10-15 ENCOUNTER — Telehealth: Payer: Self-pay | Admitting: Internal Medicine

## 2017-10-15 NOTE — Telephone Encounter (Signed)
I assumed the problem is related to certain patches of amlodipine tablets contaminated with a potential cancer origin.  The patient needs to contact his pharmacy to see if he is medication (Norvasc) supply was affected.  As far as I am aware there is nothing wrong with amlodipine per se. Thank you

## 2017-10-15 NOTE — Telephone Encounter (Signed)
Copied from [place]. Topic: Quick Communication - See Telephone Encounter >> Oct 15, 2017 12:56 PM Vernona Rieger wrote: CRM for notification. See Telephone encounter for:   10/15/17.  Patient said he saw on tv that amLODipine (NORVASC) 5 MG tablet was not good to take and that if anyone is taking this should contact their doctor. Call back [phone]

## 2017-10-16 NOTE — Telephone Encounter (Signed)
Notified pt w/MD response.../lmb 

## 2018-01-18 ENCOUNTER — Encounter: Payer: Self-pay | Admitting: Internal Medicine

## 2018-01-18 ENCOUNTER — Ambulatory Visit: Payer: Medicare Other | Admitting: Internal Medicine

## 2018-01-18 ENCOUNTER — Other Ambulatory Visit (INDEPENDENT_AMBULATORY_CARE_PROVIDER_SITE_OTHER): Payer: Medicare Other

## 2018-01-18 DIAGNOSIS — R634 Abnormal weight loss: Secondary | ICD-10-CM

## 2018-01-18 DIAGNOSIS — C61 Malignant neoplasm of prostate: Secondary | ICD-10-CM | POA: Diagnosis not present

## 2018-01-18 DIAGNOSIS — I10 Essential (primary) hypertension: Secondary | ICD-10-CM

## 2018-01-18 LAB — BASIC METABOLIC PANEL
BUN: 11 mg/dL (ref 6–23)
CO2: 29 meq/L (ref 19–32)
Calcium: 9.1 mg/dL (ref 8.4–10.5)
Chloride: 104 mEq/L (ref 96–112)
Creatinine, Ser: 0.95 mg/dL (ref 0.40–1.50)
GFR: 97.96 mL/min (ref >60.00)
Glucose, Bld: 99 mg/dL (ref 70–99)
Potassium: 4 mEq/L (ref 3.5–5.1)
SODIUM: 140 meq/L (ref 135–145)

## 2018-01-18 LAB — CBC WITH DIFFERENTIAL/PLATELET
Basophils Absolute: 0.1 10*3/uL (ref 0.0–0.1)
Basophils Relative: 1.2 % (ref 0.0–3.0)
Eosinophils Absolute: 0.1 10*3/uL (ref 0.0–0.7)
Eosinophils Relative: 2.4 % (ref 0.0–5.0)
HCT: 39.3 % (ref 39.0–52.0)
Hemoglobin: 13.1 g/dL (ref 13.0–17.0)
LYMPHS ABS: 0.9 10*3/uL (ref 0.7–4.0)
Lymphocytes Relative: 20.5 % (ref 12.0–46.0)
MCHC: 33.2 g/dL (ref 30.0–36.0)
MCV: 95.8 fl (ref 78.0–100.0)
MONOS PCT: 9.5 % (ref 3.0–12.0)
Monocytes Absolute: 0.4 10*3/uL (ref 0.1–1.0)
NEUTROS PCT: 66.4 % (ref 43.0–77.0)
Neutro Abs: 3 10*3/uL (ref 1.4–7.7)
Platelets: 222 10*3/uL (ref 150.0–400.0)
RBC: 4.1 Mil/uL — AB (ref 4.22–5.81)
RDW: 13.1 % (ref 11.5–15.5)
WBC: 4.5 10*3/uL (ref 4.0–10.5)

## 2018-01-18 LAB — HEPATIC FUNCTION PANEL
ALBUMIN: 3.9 g/dL (ref 3.5–5.2)
ALT: 19 U/L (ref 0–53)
AST: 21 U/L (ref 0–37)
Alkaline Phosphatase: 51 U/L (ref 39–117)
Bilirubin, Direct: 0.2 mg/dL (ref 0.0–0.3)
Total Bilirubin: 0.9 mg/dL (ref 0.2–1.2)
Total Protein: 6.8 g/dL (ref 6.0–8.3)

## 2018-01-18 LAB — LIPID PANEL
CHOL/HDL RATIO: 4
Cholesterol: 193 mg/dL (ref 0–200)
HDL: 50.5 mg/dL (ref >39.00)
LDL CALC: 126 mg/dL — AB (ref 0–99)
NONHDL: 142.96
Triglycerides: 84 mg/dL (ref 0.0–149.0)
VLDL: 16.8 mg/dL (ref 0.0–40.0)

## 2018-01-18 LAB — TSH: TSH: 0.93 u[IU]/mL (ref 0.35–4.50)

## 2018-01-18 NOTE — Assessment & Plan Note (Signed)
Norvasc, Benazepril 

## 2018-01-18 NOTE — Assessment & Plan Note (Signed)
PSA at [facility]

## 2018-01-18 NOTE — Assessment & Plan Note (Addendum)
?  etiology Labs RTC sooner if worse

## 2018-01-18 NOTE — Progress Notes (Signed)
Subjective:  Patient ID: Peter Wood, male    DOB: [birth year]  Age: 83 y.o. MRN: [id number]  CC: No chief complaint on file.   HPI Peter Wood presents for HTN, prostate Ca f/u Lost wt   Outpatient Medications Prior to Visit  Medication Sig Dispense Refill  . amLODipine (NORVASC) 5 MG tablet Take 1 tablet (5 mg total) daily by mouth. 90 tablet 3  . benazepril (LOTENSIN) 40 MG tablet Take 1 tablet (40 mg total) daily by mouth. 90 tablet 3  . Cholecalciferol 1000 UNITS tablet Take 1,000 Units by mouth daily.     Marland Kitchen latanoprost (XALATAN) 0.005 % ophthalmic solution     . brimonidine (ALPHAGAN) 0.2 % ophthalmic solution Place 1 drop into the right eye 2 (two) times daily. (Patient not taking: Reported on 01/18/2018) 5 mL 12   Facility-Administered Medications Prior to Visit  Medication Dose Route Frequency Provider Last Rate Last Dose  . 0.9 %  sodium chloride infusion  500 mL Intravenous Continuous Milus Banister, MD        ROS Review of Systems  Constitutional: Positive for unexpected weight change. Negative for appetite change and fatigue.  HENT: Negative for congestion, nosebleeds, sneezing, sore throat and trouble swallowing.   Eyes: Negative for itching and visual disturbance.  Respiratory: Negative for cough.   Cardiovascular: Negative for chest pain, palpitations and leg swelling.  Gastrointestinal: Negative for abdominal distention, blood in stool, diarrhea and nausea.  Genitourinary: Negative for frequency and hematuria.  Musculoskeletal: Positive for arthralgias. Negative for back pain, gait problem, joint swelling and neck pain.  Skin: Negative for rash.  Neurological: Negative for dizziness, tremors, speech difficulty and weakness.  Psychiatric/Behavioral: Negative for agitation, dysphoric mood, sleep disturbance and suicidal ideas. The patient is not nervous/anxious.     Objective:  BP 132/78 (BP Location: Right Arm, Patient Position: Sitting, Cuff Size:  Normal)   Pulse (!) 58   Temp 98.3 F (36.8 C) (Oral)   Ht 5\' 5"  (1.651 m)   Wt 159 lb (72.1 kg)   SpO2 97%   BMI 26.46 kg/m   BP Readings from Last 3 Encounters:  01/18/18 132/78  07/20/17 124/82  04/23/17 118/64    Wt Readings from Last 3 Encounters:  01/18/18 159 lb (72.1 kg)  07/20/17 165 lb (74.8 kg)  04/23/17 162 lb (73.5 kg)    Physical Exam  Constitutional: He is oriented to person, place, and time. He appears well-developed. No distress.  NAD  HENT:  Mouth/Throat: Oropharynx is clear and moist.  Eyes: Pupils are equal, round, and reactive to light. Conjunctivae are normal.  Neck: Normal range of motion. No JVD present. No thyromegaly present.  Cardiovascular: Normal rate, regular rhythm, normal heart sounds and intact distal pulses. Exam reveals no gallop and no friction rub.  No murmur heard. Pulmonary/Chest: Effort normal and breath sounds normal. No respiratory distress. He has no wheezes. He has no rales. He exhibits no tenderness.  Abdominal: Soft. Bowel sounds are normal. He exhibits no distension and no mass. There is no tenderness. There is no rebound and no guarding.  Musculoskeletal: Normal range of motion. He exhibits no edema or tenderness.  Lymphadenopathy:    He has no cervical adenopathy.  Neurological: He is alert and oriented to person, place, and time. He has normal reflexes. No cranial nerve deficit. He exhibits normal muscle tone. He displays a negative Romberg sign. Coordination and gait normal.  Skin: Skin is warm and dry. No rash  noted.  Psychiatric: He has a normal mood and affect. His behavior is normal. Judgment and thought content normal.    Lab Results  Component Value Date   WBC 4.8 07/20/2017   HGB 13.6 07/20/2017   HCT 42.2 07/20/2017   PLT 217.0 07/20/2017   GLUCOSE 94 07/20/2017   CHOL 190 07/20/2017   TRIG 89.0 07/20/2017   HDL 45.50 07/20/2017   LDLDIRECT 126.3 09/23/2007   LDLCALC 127 (H) 07/20/2017   ALT 20 07/20/2017     AST 23 07/20/2017   NA 138 07/20/2017   K 3.7 07/20/2017   CL 101 07/20/2017   CREATININE 0.98 07/20/2017   BUN 13 07/20/2017   CO2 30 07/20/2017   TSH 1.06 07/20/2017   PSA 6.26 (H) 03/11/2012   PSA 6.12 (H) 03/11/2012    No results found.  Assessment & Plan:   There are no diagnoses linked to this encounter. I have discontinued Peter Wood's brimonidine. I am also having him maintain his Cholecalciferol, benazepril, amLODipine, and latanoprost. We will continue to administer sodium chloride.  No orders of the defined types were placed in this encounter.    Follow-up: No follow-ups on file.  Walker Kehr, MD

## 2018-07-05 ENCOUNTER — Encounter (INDEPENDENT_AMBULATORY_CARE_PROVIDER_SITE_OTHER): Payer: Medicare Other | Admitting: Ophthalmology

## 2018-07-05 DIAGNOSIS — H35033 Hypertensive retinopathy, bilateral: Secondary | ICD-10-CM

## 2018-07-05 DIAGNOSIS — H2512 Age-related nuclear cataract, left eye: Secondary | ICD-10-CM

## 2018-07-05 DIAGNOSIS — H35341 Macular cyst, hole, or pseudohole, right eye: Secondary | ICD-10-CM

## 2018-07-05 DIAGNOSIS — H43812 Vitreous degeneration, left eye: Secondary | ICD-10-CM | POA: Diagnosis not present

## 2018-07-05 DIAGNOSIS — I10 Essential (primary) hypertension: Secondary | ICD-10-CM | POA: Diagnosis not present

## 2018-07-22 ENCOUNTER — Other Ambulatory Visit (INDEPENDENT_AMBULATORY_CARE_PROVIDER_SITE_OTHER): Payer: Medicare Other

## 2018-07-22 ENCOUNTER — Encounter: Payer: Self-pay | Admitting: Internal Medicine

## 2018-07-22 ENCOUNTER — Ambulatory Visit (INDEPENDENT_AMBULATORY_CARE_PROVIDER_SITE_OTHER): Payer: Medicare Other | Admitting: Internal Medicine

## 2018-07-22 VITALS — BP 134/80 | HR 58 | Temp 98.6°F | Ht 65.0 in | Wt 161.0 lb

## 2018-07-22 DIAGNOSIS — I10 Essential (primary) hypertension: Secondary | ICD-10-CM | POA: Diagnosis not present

## 2018-07-22 DIAGNOSIS — I8392 Asymptomatic varicose veins of left lower extremity: Secondary | ICD-10-CM

## 2018-07-22 DIAGNOSIS — Z23 Encounter for immunization: Secondary | ICD-10-CM | POA: Diagnosis not present

## 2018-07-22 DIAGNOSIS — I8390 Asymptomatic varicose veins of unspecified lower extremity: Secondary | ICD-10-CM | POA: Insufficient documentation

## 2018-07-22 DIAGNOSIS — C61 Malignant neoplasm of prostate: Secondary | ICD-10-CM | POA: Diagnosis not present

## 2018-07-22 LAB — BASIC METABOLIC PANEL
BUN: 14 mg/dL (ref 6–23)
CALCIUM: 8.8 mg/dL (ref 8.4–10.5)
CHLORIDE: 103 meq/L (ref 96–112)
CO2: 30 mEq/L (ref 19–32)
CREATININE: 0.98 mg/dL (ref 0.40–1.50)
GFR: 94.39 mL/min (ref >60.00)
Glucose, Bld: 94 mg/dL (ref 70–99)
Potassium: 3.7 mEq/L (ref 3.5–5.1)
Sodium: 140 mEq/L (ref 135–145)

## 2018-07-22 NOTE — Patient Instructions (Signed)

## 2018-07-22 NOTE — Progress Notes (Signed)
Subjective:  Patient ID: Peter Wood, male    DOB: 1935-03-07  Age: 83 y.o. MRN: [id number]  CC: No chief complaint on file.   HPI Peter Wood presents for HTN, prostate ca - [facility] (PSA is down), retinal detachment f/u  Outpatient Medications Prior to Visit  Medication Sig Dispense Refill  . amLODipine (NORVASC) 5 MG tablet Take 1 tablet (5 mg total) daily by mouth. 90 tablet 3  . benazepril (LOTENSIN) 40 MG tablet Take 1 tablet (40 mg total) daily by mouth. 90 tablet 3  . Cholecalciferol 1000 UNITS tablet Take 1,000 Units by mouth daily.     Marland Kitchen latanoprost (XALATAN) 0.005 % ophthalmic solution      Facility-Administered Medications Prior to Visit  Medication Dose Route Frequency Provider Last Rate Last Dose  . 0.9 %  sodium chloride infusion  500 mL Intravenous Continuous Milus Banister, MD        ROS: Review of Systems  Constitutional: Negative for appetite change, fatigue and unexpected weight change.  HENT: Negative for congestion, nosebleeds, sneezing, sore throat and trouble swallowing.   Eyes: Negative for itching and visual disturbance.  Respiratory: Negative for cough.   Cardiovascular: Negative for chest pain, palpitations and leg swelling.  Gastrointestinal: Negative for abdominal distention, blood in stool, diarrhea and nausea.  Genitourinary: Negative for frequency and hematuria.  Musculoskeletal: Negative for back pain, gait problem, joint swelling and neck pain.  Skin: Negative for rash.  Neurological: Negative for dizziness, tremors, speech difficulty and weakness.  Psychiatric/Behavioral: Negative for agitation, dysphoric mood, sleep disturbance and suicidal ideas. The patient is not nervous/anxious.     Objective:  BP 134/80 (BP Location: Left Arm, Patient Position: Sitting, Cuff Size: Normal)   Pulse (!) 58   Temp 98.6 F (37 C) (Oral)   Ht 5\' 5"  (1.651 m)   Wt 161 lb (73 kg)   SpO2 99%   BMI 26.79 kg/m   BP Readings from Last 3  Encounters:  07/22/18 134/80  01/18/18 132/78  07/20/17 124/82    Wt Readings from Last 3 Encounters:  07/22/18 161 lb (73 kg)  01/18/18 159 lb (72.1 kg)  07/20/17 165 lb (74.8 kg)    Physical Exam  Constitutional: He is oriented to person, place, and time. He appears well-developed. No distress.  NAD  HENT:  Mouth/Throat: Oropharynx is clear and moist.  Eyes: Pupils are equal, round, and reactive to light. Conjunctivae are normal.  Neck: Normal range of motion. No JVD present. No thyromegaly present.  Cardiovascular: Normal rate, regular rhythm, normal heart sounds and intact distal pulses. Exam reveals no gallop and no friction rub.  No murmur heard. Pulmonary/Chest: Effort normal and breath sounds normal. No respiratory distress. He has no wheezes. He has no rales. He exhibits no tenderness.  Abdominal: Soft. Bowel sounds are normal. He exhibits no distension and no mass. There is no tenderness. There is no rebound and no guarding.  Musculoskeletal: Normal range of motion. He exhibits no edema or tenderness.  Lymphadenopathy:    He has no cervical adenopathy.  Neurological: He is alert and oriented to person, place, and time. He has normal reflexes. No cranial nerve deficit. He exhibits normal muscle tone. He displays a negative Romberg sign. Coordination and gait normal.  Skin: Skin is warm and dry. No rash noted.  Psychiatric: He has a normal mood and affect. His behavior is normal. Judgment and thought content normal.  LLE var vein  Lab Results  Component Value  Date   WBC 4.5 01/18/2018   HGB 13.1 01/18/2018   HCT 39.3 01/18/2018   PLT 222.0 01/18/2018   GLUCOSE 99 01/18/2018   CHOL 193 01/18/2018   TRIG 84.0 01/18/2018   HDL 50.50 01/18/2018   LDLDIRECT 126.3 09/23/2007   LDLCALC 126 (H) 01/18/2018   ALT 19 01/18/2018   AST 21 01/18/2018   NA 140 01/18/2018   K 4.0 01/18/2018   CL 104 01/18/2018   CREATININE 0.95 01/18/2018   BUN 11 01/18/2018   CO2 29  01/18/2018   TSH 0.93 01/18/2018   PSA 6.26 (H) 03/11/2012   PSA 6.12 (H) 03/11/2012    No results found.  Assessment & Plan:   There are no diagnoses linked to this encounter.   No orders of the defined types were placed in this encounter.    Follow-up: No follow-ups on file.  Walker Kehr, MD

## 2018-07-22 NOTE — Assessment & Plan Note (Signed)
PSA was good at [facility]

## 2018-07-22 NOTE — Assessment & Plan Note (Signed)
Discussed Vasc surg ref if needed

## 2018-07-22 NOTE — Assessment & Plan Note (Addendum)
BMET CT Ca scoring info

## 2018-07-22 NOTE — Addendum Note (Signed)
Addended by: Karren Cobble on: 07/22/2018 11:45 AM   Modules accepted: Orders

## 2018-08-01 ENCOUNTER — Encounter: Payer: Self-pay | Admitting: Internal Medicine

## 2018-08-01 ENCOUNTER — Ambulatory Visit: Payer: Medicare Other | Admitting: Internal Medicine

## 2018-08-01 DIAGNOSIS — J069 Acute upper respiratory infection, unspecified: Secondary | ICD-10-CM | POA: Diagnosis not present

## 2018-08-01 MED ORDER — PROMETHAZINE-CODEINE 6.25-10 MG/5ML PO SYRP: 5.0000 mL | ORAL_SOLUTION | ORAL | 0 refills | Status: DC | PRN

## 2018-08-01 MED ORDER — AZITHROMYCIN 250 MG PO TABS: ORAL_TABLET | ORAL | 0 refills | Status: DC

## 2018-08-01 NOTE — Patient Instructions (Addendum)
You can use over-the-counter  "cold" medicines  such as  "Afrin" nasal spray for nasal congestion as directed. Use " Delsym" or" Robitussin" cough syrup varietis for cough.  You can use plain "Tylenol" or "Advil" for fever, chills and achyness. Use Halls or Ricola cough drops.     Please, make an appointment if you are not better or if you're worse.  

## 2018-08-01 NOTE — Progress Notes (Signed)
Subjective:  Patient ID: Peter Wood, male    DOB: 1935-04-06  Age: 83 y.o. MRN: [id number]  CC: No chief complaint on file.   HPI Peter Wood presents for URI sx's x 1 week. Yellow mucus d/c  Outpatient Medications Prior to Visit  Medication Sig Dispense Refill  . amLODipine (NORVASC) 5 MG tablet Take 1 tablet (5 mg total) daily by mouth. 90 tablet 3  . benazepril (LOTENSIN) 40 MG tablet Take 1 tablet (40 mg total) daily by mouth. 90 tablet 3  . Cholecalciferol 1000 UNITS tablet Take 1,000 Units by mouth daily.     Marland Kitchen latanoprost (XALATAN) 0.005 % ophthalmic solution      No facility-administered medications prior to visit.     ROS: Review of Systems  Constitutional: Positive for chills and fatigue. Negative for appetite change and unexpected weight change.  HENT: Positive for congestion, postnasal drip, rhinorrhea and sore throat. Negative for nosebleeds, sneezing and trouble swallowing.   Eyes: Negative for itching and visual disturbance.  Respiratory: Positive for cough.   Cardiovascular: Negative for chest pain, palpitations and leg swelling.  Gastrointestinal: Negative for abdominal distention, blood in stool, diarrhea and nausea.  Genitourinary: Negative for frequency and hematuria.  Musculoskeletal: Positive for arthralgias and myalgias. Negative for back pain, gait problem, joint swelling and neck pain.  Skin: Negative for rash.  Neurological: Negative for dizziness, tremors, speech difficulty and weakness.  Psychiatric/Behavioral: Negative for agitation, dysphoric mood, sleep disturbance and suicidal ideas. The patient is not nervous/anxious.     Objective:  BP (!) 146/90 (BP Location: Left Arm, Patient Position: Sitting, Cuff Size: Normal)   Pulse 70   Temp 98.8 F (37.1 C) (Oral)   Ht 5\' 5"  (1.651 m)   Wt 158 lb (71.7 kg)   SpO2 97%   BMI 26.29 kg/m   BP Readings from Last 3 Encounters:  08/01/18 (!) 146/90  07/22/18 134/80  01/18/18 132/78     Wt Readings from Last 3 Encounters:  08/01/18 158 lb (71.7 kg)  07/22/18 161 lb (73 kg)  01/18/18 159 lb (72.1 kg)    Physical Exam  Constitutional: He is oriented to person, place, and time. He appears well-developed. No distress.  NAD  HENT:  Mouth/Throat: Oropharynx is clear and moist.  Eyes: Pupils are equal, round, and reactive to light. Conjunctivae are normal.  Neck: Normal range of motion. No JVD present. No thyromegaly present.  Cardiovascular: Normal rate, regular rhythm, normal heart sounds and intact distal pulses. Exam reveals no gallop and no friction rub.  No murmur heard. Pulmonary/Chest: Effort normal and breath sounds normal. No respiratory distress. He has no wheezes. He has no rales. He exhibits no tenderness.  Abdominal: Soft. Bowel sounds are normal. He exhibits no distension and no mass. There is no tenderness. There is no rebound and no guarding.  Musculoskeletal: Normal range of motion. He exhibits no edema or tenderness.  Lymphadenopathy:    He has no cervical adenopathy.  Neurological: He is alert and oriented to person, place, and time. He has normal reflexes. No cranial nerve deficit. He exhibits normal muscle tone. He displays a negative Romberg sign. Coordination and gait normal.  Skin: Skin is warm and dry. No rash noted.  Psychiatric: He has a normal mood and affect. His behavior is normal. Judgment and thought content normal.  eryth throat, nares  Lab Results  Component Value Date   WBC 4.5 01/18/2018   HGB 13.1 01/18/2018   HCT 39.3 01/18/2018  PLT 222.0 01/18/2018   GLUCOSE 94 07/22/2018   CHOL 193 01/18/2018   TRIG 84.0 01/18/2018   HDL 50.50 01/18/2018   LDLDIRECT 126.3 09/23/2007   LDLCALC 126 (H) 01/18/2018   ALT 19 01/18/2018   AST 21 01/18/2018   NA 140 07/22/2018   K 3.7 07/22/2018   CL 103 07/22/2018   CREATININE 0.98 07/22/2018   BUN 14 07/22/2018   CO2 30 07/22/2018   TSH 0.93 01/18/2018   PSA 6.26 (H) 03/11/2012    PSA 6.12 (H) 03/11/2012    No results found.  Assessment & Plan:   There are no diagnoses linked to this encounter.   No orders of the defined types were placed in this encounter.    Follow-up: No follow-ups on file.  Walker Kehr, MD

## 2018-08-01 NOTE — Assessment & Plan Note (Signed)
Zpac Prom-cod prn

## 2018-09-07 ENCOUNTER — Other Ambulatory Visit: Payer: Self-pay

## 2018-09-07 ENCOUNTER — Encounter (HOSPITAL_COMMUNITY): Payer: Self-pay

## 2018-09-07 ENCOUNTER — Emergency Department (HOSPITAL_COMMUNITY): Payer: Medicare Other

## 2018-09-07 ENCOUNTER — Emergency Department (HOSPITAL_COMMUNITY)
Admission: EM | Admit: 2018-09-07 | Discharge: 2018-09-07 | Disposition: A | Discharge status: Home / Self Care | Payer: Medicare Other | Attending: Emergency Medicine | Admitting: Emergency Medicine

## 2018-09-07 DIAGNOSIS — Z87891 Personal history of nicotine dependence: Secondary | ICD-10-CM | POA: Insufficient documentation

## 2018-09-07 DIAGNOSIS — Z79899 Other long term (current) drug therapy: Secondary | ICD-10-CM | POA: Diagnosis not present

## 2018-09-07 DIAGNOSIS — R1033 Periumbilical pain: Secondary | ICD-10-CM | POA: Diagnosis present

## 2018-09-07 DIAGNOSIS — R160 Hepatomegaly, not elsewhere classified: Secondary | ICD-10-CM | POA: Diagnosis not present

## 2018-09-07 DIAGNOSIS — I10 Essential (primary) hypertension: Secondary | ICD-10-CM | POA: Diagnosis not present

## 2018-09-07 DIAGNOSIS — R1010 Upper abdominal pain, unspecified: Secondary | ICD-10-CM | POA: Insufficient documentation

## 2018-09-07 LAB — COMPREHENSIVE METABOLIC PANEL
ALT: 59 U/L — ABNORMAL HIGH (ref 0–44)
AST: 61 U/L — ABNORMAL HIGH (ref 15–41)
Albumin: 3.5 g/dL (ref 3.5–5.0)
Alkaline Phosphatase: 134 U/L — ABNORMAL HIGH (ref 38–126)
Anion gap: 9 (ref 5–15)
BUN: 14 mg/dL (ref 8–23)
CO2: 26 mmol/L (ref 22–32)
Calcium: 8.6 mg/dL — ABNORMAL LOW (ref 8.9–10.3)
Chloride: 103 mmol/L (ref 98–111)
Creatinine, Ser: 1.03 mg/dL (ref 0.61–1.24)
GFR calc Af Amer: >60 mL/min (ref >60)
GFR calc non Af Amer: >60 mL/min (ref >60)
Glucose, Bld: 93 mg/dL (ref 70–99)
Potassium: 3.9 mmol/L (ref 3.5–5.1)
Sodium: 138 mmol/L (ref 135–145)
Total Bilirubin: 1.4 mg/dL — ABNORMAL HIGH (ref 0.3–1.2)
Total Protein: 6.9 g/dL (ref 6.5–8.1)

## 2018-09-07 LAB — CBC
HCT: 37.5 % — ABNORMAL LOW (ref 39.0–52.0)
Hemoglobin: 11.7 g/dL — ABNORMAL LOW (ref 13.0–17.0)
MCH: 29.2 pg (ref 26.0–34.0)
MCHC: 31.2 g/dL (ref 30.0–36.0)
MCV: 93.5 fL (ref 80.0–100.0)
Platelets: 251 10*3/uL (ref 150–400)
RBC: 4.01 MIL/uL — ABNORMAL LOW (ref 4.22–5.81)
RDW: 12.9 % (ref 11.5–15.5)
WBC: 6.7 10*3/uL (ref 4.0–10.5)
nRBC: 0 % (ref 0.0–0.2)

## 2018-09-07 LAB — URINALYSIS, ROUTINE W REFLEX MICROSCOPIC
Bacteria, UA: NONE SEEN
Bilirubin Urine: NEGATIVE
Glucose, UA: NEGATIVE mg/dL
Hgb urine dipstick: NEGATIVE
Ketones, ur: NEGATIVE mg/dL
Leukocytes, UA: NEGATIVE
Nitrite: NEGATIVE
Protein, ur: 100 mg/dL — AB
Specific Gravity, Urine: 1.021 (ref 1.005–1.030)
pH: 6 (ref 5.0–8.0)

## 2018-09-07 LAB — LIPASE, BLOOD: Lipase: 32 U/L (ref 11–51)

## 2018-09-07 MED ORDER — SODIUM CHLORIDE (PF) 0.9 % IJ SOLN
INTRAMUSCULAR | Status: AC
  Filled 2018-09-07: qty 50

## 2018-09-07 MED ORDER — OXYCODONE HCL 5 MG PO TABS: 5.0000 mg | ORAL_TABLET | ORAL | 0 refills | Status: DC | PRN

## 2018-09-07 MED ORDER — IOPAMIDOL (ISOVUE-300) INJECTION 61%
100.0000 mL | Freq: Once | INTRAVENOUS | Status: AC | PRN
  Administered 2018-09-07: 100 mL via INTRAVENOUS

## 2018-09-07 MED ORDER — IOPAMIDOL (ISOVUE-300) INJECTION 61%
INTRAVENOUS | Status: AC
  Filled 2018-09-07: qty 100

## 2018-09-07 NOTE — ED Provider Notes (Signed)
[place] DEPT Provider Note   CSN: [id number] Arrival date & time: 09/07/18  1149     History   Chief Complaint Chief Complaint  Patient presents with  . Abdominal Pain    HPI Peter Wood is a 83 y.o. male.  HPI   83yM with abdominal pain. Onset around 0500 today while in bed. Pain is periumbilical to epigastric. Constant. No n/v. No urinary complaints. No fever or chills. Hasn't eaten anything today. Never had symptoms like this. No prior abdominal surgery.   Past Medical History:  Diagnosis Date  . Arthritis   . Diverticulosis of colon   . HTN (hypertension)   . Hx of colonic polyp   . Pneumonia    as a child/baby  . Poor circulation of extremity    right leg  . Prostate cancer [facility]) 2015   prostate cancer - radiation treated    Patient Active Problem List   Diagnosis Date Noted  . Upper respiratory infection 08/01/2018  . Varicose vein of leg 07/22/2018  . Weight loss 01/18/2018  . Claudication ([place]) 01/16/2017  . Macular hole, right 09/19/2016  . Macular hole, right eye 09/19/2016  . Right sciatic nerve pain 07/19/2016  . Finger mass, right 06/24/2015  . Prostate cancer ([place]) 09/16/2013  . Rash and nonspecific skin eruption 02/12/2013  . Well adult exam 09/01/2011  . TOBACCO USE, QUIT 08/25/2009  . ERECTILE DYSFUNCTION 03/03/2009  . PSA, INCREASED 03/03/2009  . Essential hypertension 06/10/2007  . DIVERTICULOSIS, COLON 06/10/2007  . COLONIC POLYPS, HX OF 06/10/2007    Past Surgical History:  Procedure Laterality Date  . [place] VITRECTOMY WITH 20 GAUGE MVR PORT FOR MACULAR HOLE Right 09/19/2016   Procedure: 25 GAUGE PARS PLANA VITRECTOMY WITH 20 GAUGE MVR PORT FOR MACULAR HOLE, MEMBRANE PEEL, SERUM PATCH, GAS FLUID EXCHANGE, HEADSCOPE LASER;  Surgeon: [name], MD;  Location: [place];  Service: Ophthalmology;  Laterality: Right;  . CATARACT EXTRACTION  08/27/12   Right  . COLONOSCOPY    . HAND  SURGERY  2016   right thumb  . KNEE SURGERY Left 1989        Home Medications    Prior to Admission medications   Medication Sig Start Date End Date Taking? Authorizing Provider  amLODipine (NORVASC) 5 MG tablet Take 1 tablet (5 mg total) daily by mouth. 07/20/17  Yes Plotnikov, Evie Lacks, MD  benazepril (LOTENSIN) 40 MG tablet Take 1 tablet (40 mg total) daily by mouth. 07/20/17  Yes Plotnikov, Evie Lacks, MD  Cholecalciferol 1000 UNITS tablet Take 1,000 Units by mouth daily.    Yes [provider]  latanoprost (XALATAN) 0.005 % ophthalmic solution Place 1 drop into both eyes at bedtime.  10/24/17  Yes [provider]  promethazine-codeine (PHENERGAN WITH CODEINE) 6.25-10 MG/5ML syrup Take 5 mLs by mouth every 4 (four) hours as needed. 08/01/18  Yes Plotnikov, Evie Lacks, MD  azithromycin (ZITHROMAX Z-PAK) 250 MG tablet As directed Patient not taking: Reported on 09/07/2018 08/01/18   Plotnikov, Evie Lacks, MD    Family History Family History  Problem Relation Age of Onset  . Prostate cancer Father   . Hypertension Mother   . Breast cancer Sister     Social History Social History   Tobacco Use  . Smoking status: Former Smoker    Years: 4.00    Last attempt to quit: 09/12/1967    Years since quitting: 51.0  . Smokeless tobacco: Never Used  Substance Use Topics  . Alcohol use: No  . Drug use: No     Allergies   Patient has no known allergies.   Review of Systems Review of Systems  All systems reviewed and negative, other than as noted in HPI.  Physical Exam Updated Vital Signs BP 137/81 (BP Location: Left Arm)   Pulse 65   Temp 98.7 F (37.1 C) (Oral)   Resp 18   Ht 5\' 6"  (1.676 m)   Wt 72.1 kg   SpO2 98%   BMI 25.66 kg/m   Physical Exam Vitals signs and nursing note reviewed.  Constitutional:      General: He is not in acute distress.    Appearance: He is well-developed.  HENT:     Head: Normocephalic and atraumatic.  Eyes:      General:        Right eye: No discharge.        Left eye: No discharge.     Conjunctiva/sclera: Conjunctivae normal.  Neck:     Musculoskeletal: Neck supple.  Cardiovascular:     Rate and Rhythm: Normal rate and regular rhythm.     Heart sounds: Normal heart sounds. No murmur. No friction rub. No gallop.   Pulmonary:     Effort: Pulmonary effort is normal. No respiratory distress.     Breath sounds: Normal breath sounds.  Abdominal:     General: There is no distension.     Palpations: Abdomen is soft.     Tenderness: There is abdominal tenderness in the right upper quadrant and epigastric area. There is no guarding or rebound.     Hernia: No hernia is present.  Musculoskeletal:        General: No tenderness.  Skin:    General: Skin is warm and dry.  Neurological:     Mental Status: He is alert.  Psychiatric:        Behavior: Behavior normal.        Thought Content: Thought content normal.      ED Treatments / Results  Labs (all labs ordered are listed, but only abnormal results are displayed) Labs Reviewed  COMPREHENSIVE METABOLIC PANEL - Abnormal; Notable for the following components:      Result Value   Calcium 8.6 (*)    AST 61 (*)    ALT 59 (*)    Alkaline Phosphatase 134 (*)    Total Bilirubin 1.4 (*)    All other components within normal limits  CBC - Abnormal; Notable for the following components:   RBC 4.01 (*)    Hemoglobin 11.7 (*)    HCT 37.5 (*)    All other components within normal limits  URINALYSIS, ROUTINE W REFLEX MICROSCOPIC - Abnormal; Notable for the following components:   Protein, ur 100 (*)    All other components within normal limits  LIPASE, BLOOD    EKG None  Radiology No results found.   Ct Abdomen Pelvis W Contrast  Result Date: 09/07/2018 CLINICAL DATA:  Left lower quadrant pain since 5 a.m. History of prostate cancer without surgery. EXAM: CT ABDOMEN AND PELVIS WITH CONTRAST TECHNIQUE: Multidetector CT imaging of the abdomen  and pelvis was performed using the standard protocol following bolus administration of intravenous contrast. CONTRAST:  154mL ISOVUE-300 IOPAMIDOL (ISOVUE-300) INJECTION 61% COMPARISON:  Same day abdominal ultrasound. FINDINGS: Lower chest: Subpleural areas of fine reticular markings are identified in the periphery of the right middle lobe and medial left lower lobe suspicious for atelectasis and/or  scarring. Hepatobiliary: Too numerous to count hypodense masses of the liver are identified in keeping with findings on earlier same day ultrasound of the abdomen. Findings are concerning for hepatic metastasis. No biliary dilatation. Gallbladder is free of stones. No choledocholithiasis. Pancreas: In the region of the porta hepatis, there are hypodense lesions more likely associated with the liver though the pancreatic head is partially obscured by 3.2 cm hypodense mass. Findings are more likely associated with the liver and less likely an isolated pancreatic mass. No pathologic ductal dilatation of the pancreas is seen. The pancreatic duct as visualized is within normal limits in caliber measuring 1-2 mm. No inflammation of the pancreas is seen. Spleen: Normal Adrenals/Urinary Tract: No adrenal mass. Subcentimeter hypodensities are seen within both kidneys statistically consistent with cysts but are too small to characterize. No hydroureteronephrosis or nephrolithiasis. The urinary bladder is physiologically distended and unremarkable. Stomach/Bowel: Small hiatal hernia with coapted gastroesophageal juncture limiting further assessment in this portion. There is mild thickening of this portion of the stomach as well as distal body and antrum. The duodenal bulb and sweep, small and large intestine are nonacute. Moderate stool retention within the colon is identified without bowel obstruction or inflammation. Vascular/Lymphatic: Minimally atherosclerotic abdominal aorta without aneurysm. Mesenteric nodule in the mid  abdomen, series 7/31 may represent a mildly enlarged lymph node up to 17 mm versus partial volume averaging of small bowel. PET-CT may help for further correlation. Peripancreatic 17 mm lymph node posterior to the stomach, series 7/16 is also identified. Reproductive: Brachia therapy seeds are identified within the prostate. No localized soft tissue involvement. Other: No free air. No free fluid. Degenerative changes are present along the dorsal spine and both hips. No aggressive osseous lesions. Small sclerotic density in the left iliac bone adjacent to the SI joint measuring 6 mm is noted, nonspecific in isolation. Given history of prostate cancer, differential considerations would include osteoblastic metastasis or possibly bone island. Musculoskeletal: IMPRESSION: 1. Too numerous to count hypodense masses of the liver concerning for hepatic metastasis. In the region of the porta hepatis, there are hypodense lesions more likely associated with the liver though the pancreatic head is partially obscured by a 3.2 cm hypodense mass. Findings are more likely associated with the liver and less likely an isolated pancreatic mass. 2. Upper abdominal mesenteric and peripancreatic lymphadenopathy suspicious for metastatic disease. 3. 6 mm sclerotic density in the left iliac bone adjacent to the SI joint. Given history of prostate cancer, differential considerations would include osteoblastic metastasis or possibly bone island. Electronically Signed   By: Ashley Royalty M.D.   On: 09/07/2018 19:10   US Abdomen Limited  Result Date: 09/07/2018 CLINICAL DATA:  Right upper quadrant abdominal pain. EXAM: ULTRASOUND ABDOMEN LIMITED RIGHT UPPER QUADRANT COMPARISON:  None. FINDINGS: Gallbladder: No cholelithiasis. No gallbladder wall thickening. Negative sonographic Murphy sign. Common bile duct: Diameter: 10 mm Liver: Multiple mixed echogenicity masses are demonstrated throughout the liver with the largest measuring up to 4.1 x  2.4 x 3.3 cm in the left hepatic lobe. Within the region of the porta hepatis there is a 5.8 x 2.2 cm mixed echogenicity mass. Portal vein is patent on color Doppler imaging with normal direction of blood flow towards the liver. IMPRESSION: 1. Multiple mixed echogenicity masses throughout the liver concerning for possibility of metastatic disease. 2. Heterogeneous mass within the porta hepatis may represent pancreatic mass or hepatic mass, of uncertain etiology on current examination. Possibility of pancreatic malignancy not excluded. 3. Common bile  duct is dilated, potentially secondary to distal obstructing mass. 4. Given the multitude of findings, recommend further evaluation with contrast-enhanced CT or MRI. Electronically Signed   By: Lovey Newcomer M.D.   On: 09/07/2018 16:04    Procedures Procedures (including critical care time)  Medications Ordered in ED Medications - No data to display   Initial Impression / Assessment and Plan / ED Course  I have reviewed the triage vital signs and the nursing notes.  Pertinent labs & imaging results that were available during my care of the patient were reviewed by me and considered in my medical decision making (see chart for details).     83yM with mid/upper abdominal pain. Maximally tender in epigastrium. Mild elevation in lfts. Cholelithiasis? Cholecystitis? Labs otherwise fairly unremarkable. Lipase normal. Normal LFTs.   Pt declining pain meds. Encouraged to ask if he feels like he needs something. Will obtain RUQ [place]. Possible CT a/p if no obvious explanatory pathology.   4:17 PM Unfortunately, SU concerning for malignancy. Will CT to further evaluate.   CT showing the same. Discussed imaging with patient/family. Discussed need for oncology FU as soon as he could. PRN pain meds.   Final Clinical Impressions(s) / ED Diagnoses   Final diagnoses:  Pain of upper abdomen  Liver masses    ED Discharge Orders         Ordered    oxyCODONE  (ROXICODONE) 5 MG immediate release tablet  Every 4 hours PRN     09/07/18 1954           Virgel Manifold, MD 09/13/18 [phone]

## 2018-09-07 NOTE — ED Triage Notes (Signed)
Patient c/o left mid abdominal pain since 0500 today. Patient denies any N/V/D.

## 2018-09-07 NOTE — ED Notes (Signed)
US at bedside

## 2018-09-07 NOTE — Discharge Instructions (Signed)
Follow-up as soon as you can with your oncologist at [facility].

## 2018-09-14 ENCOUNTER — Other Ambulatory Visit: Payer: Self-pay | Admitting: Internal Medicine

## 2018-09-18 ENCOUNTER — Encounter: Payer: Self-pay | Admitting: Nurse Practitioner

## 2018-09-18 ENCOUNTER — Other Ambulatory Visit (INDEPENDENT_AMBULATORY_CARE_PROVIDER_SITE_OTHER): Payer: Medicare Other

## 2018-09-18 ENCOUNTER — Ambulatory Visit: Payer: Medicare Other | Admitting: Nurse Practitioner

## 2018-09-18 VITALS — BP 112/64 | HR 70 | Ht 65.0 in | Wt 151.4 lb

## 2018-09-18 DIAGNOSIS — R109 Unspecified abdominal pain: Secondary | ICD-10-CM

## 2018-09-18 DIAGNOSIS — R160 Hepatomegaly, not elsewhere classified: Secondary | ICD-10-CM

## 2018-09-18 DIAGNOSIS — R634 Abnormal weight loss: Secondary | ICD-10-CM | POA: Diagnosis not present

## 2018-09-18 DIAGNOSIS — R932 Abnormal findings on diagnostic imaging of liver and biliary tract: Secondary | ICD-10-CM

## 2018-09-18 DIAGNOSIS — R1012 Left upper quadrant pain: Secondary | ICD-10-CM

## 2018-09-18 LAB — PSA: PSA: 0.54 ng/mL (ref 0.10–4.00)

## 2018-09-18 NOTE — Progress Notes (Signed)
Chief Complaint:     Abdominal pain  IMPRESSION and PLAN:    84 yo male with nausea, abdominal pain,  8 pound weight loss, mildly elevated liver test.  CTAP with contrast in ED on 09/07/18 concerning for metastatic liver lesions, possible porta hepatitis mass.  CBD dilated on U/S but not on CTAP. Of note, he had a normal colonoscopy in Aug 2018.  -patient's GI, Dr. Ardis Hughs, reviewed CT images this a.m. Right now it doesn't appear that pancreas is primary site  -Obtain CT of chest for staging -IR referral for liver lesion biopsy -Tumor markers today including CEA , CA-19-9, AFP and PSA -Referral to Oncology -Will send a copy of my note to patient's PCP as I do not think he is aware of these new developments  2. Left upper to mid left abdominal pain.  Etiology not clear. Not described in the Impression portion of CT scan , the body of the report suggests mild thickening of portion of the stomach as well as distal body and antrum.  -His left mid abdominal discomfort may or may not be related to #1.  If discomfort persists and cause not determined, consider EGD at some point. Right now I want to focus on expediting workup of liver lesions.     HPI:     Patient is an 84 year old relatively healthy male with history of hypertension and prostate cancer . He is known to Dr. Ardis Hughs for history of colon polyps.   Patient woke up in the early morning hours of 09/07/2018 with left mid abdominal pain.  The pain eventually migrated to the periumbilical area. Patient went to ED and found to have abnormal liver tests , mild normocytic anemia CTAP remarkable for and multiple liver lesions concerning for metastatic disease. ED advised him to see GI per wife.   ED Data reviewed:  ED 09/07/18 AST 61, ALT 59, T bili 1.4, alkaline phosphatase 134\ Lipase 32 WBC 6.8, hemoglobin 11.7, MCV 93.5, platelets 251  Abdominal u/s IMPRESSION: 1. Multiple mixed echogenicity masses throughout the  liver concerning for possibility of metastatic disease. 2. Heterogeneous mass within the porta hepatis may represent pancreatic mass or hepatic mass, of uncertain etiology on current examination. Possibility of pancreatic malignancy not excluded. 3. Common bile duct is dilated, potentially secondary to distal obstructing mass. 4. Given the multitude of findings, recommend further evaluation with contrast-enhanced CT or MRI.  CTAP with contrast IMPRESSION: 1. Too numerous to count hypodense masses of the liver concerning for hepatic metastasis. In the region of the porta hepatis, there are hypodense lesions more likely associated with the liver though the pancreatic head is partially obscured by a 3.2 cm hypodense mass. Findings are more likely associated with the liver and less likely an isolated pancreatic mass. 2.  suspicious for metastatic disease. 3. 6 mm sclerotic density in the left iliac bone adjacent to the SI joint. Given history of prostate cancer, differential considerations would include osteoblastic metastasis or possibly bone island.   Patient has history of colon polyps.  He had a complete colonoscopy with good prep August 2018  Patient continues to have left mid abdominal discomfort.  Is unrelated to eating though he does report a poor appetite, nausea and weight loss.  Per EMR his weight is down 9 pounds since pain started on 12/28.  Bowel movements are at baseline.   Review of systems:     No chest pain, no SOB, no fevers, no urinary sx  Past Medical History:  Diagnosis Date  . Arthritis   . Diverticulosis of colon   . HTN (hypertension)   . Hx of colonic polyp   . Pneumonia    as a child/baby  . Poor circulation of extremity    right leg  . Prostate cancer [facility]) 2015   prostate cancer - radiation treated    Patient's surgical history, family medical history, social history, medications and allergies were all reviewed in Epic   Serum creatinine:  1.03 mg/dL 09/07/18 1210 Estimated creatinine clearance: 48.9 mL/min  Current Outpatient Medications  Medication Sig Dispense Refill  . amLODipine (NORVASC) 5 MG tablet Take 1 tablet (5 mg total) daily by mouth. 90 tablet 3  . azithromycin (ZITHROMAX Z-PAK) 250 MG tablet As directed 6 tablet 0  . benazepril (LOTENSIN) 40 MG tablet TAKE ONE TABLET BY MOUTH ONE TIME DAILY  90 tablet 1  . Cholecalciferol 1000 UNITS tablet Take 1,000 Units by mouth daily.     Marland Kitchen latanoprost (XALATAN) 0.005 % ophthalmic solution Place 1 drop into both eyes at bedtime.     Marland Kitchen oxyCODONE (ROXICODONE) 5 MG immediate release tablet Take 1 tablet (5 mg total) by mouth every 4 (four) hours as needed for severe pain. (Patient not taking: Reported on 09/18/2018) 20 tablet 0  . promethazine-codeine (PHENERGAN WITH CODEINE) 6.25-10 MG/5ML syrup Take 5 mLs by mouth every 4 (four) hours as needed. (Patient not taking: Reported on 09/18/2018) 300 mL 0   No current facility-administered medications for this visit.     Physical Exam:     BP 112/64   Pulse 70   Ht 5\' 5"  (1.651 m)   Wt 151 lb 6.4 oz (68.7 kg)   SpO2 100%   BMI 25.19 kg/m   GENERAL:  Pleasant male in NAD PSYCH: : Cooperative, normal affect EENT:  conjunctiva pink, mucous membranes moist, neck supple without masses CARDIAC:  RRR, no murmur heard, no peripheral edema PULM: Normal respiratory effort, lungs CTA bilaterally, no wheezing ABDOMEN:  Nondistended, soft, nontender. No obvious masses, no hepatomegaly,  normal bowel sounds SKIN:  turgor, no lesions seen Musculoskeletal:  Normal muscle tone, normal strength NEURO: Alert and oriented x 3, no focal neurologic deficits   Tye Savoy , NP 09/18/2018, 9:58 AM   Cc:  Lew Dawes, MD

## 2018-09-18 NOTE — Patient Instructions (Signed)
If you are age 82 or older, your body mass index should be between 23-30. Your Body mass index is 25.19 kg/m. If this is out of the aforementioned range listed, please consider follow up with your Primary Care Provider.  If you are age 70 or younger, your body mass index should be between 19-25. Your Body mass index is 25.19 kg/m. If this is out of the aformentioned range listed, please consider follow up with your Primary Care Provider.   Your provider has requested that you go to the basement level for lab work before leaving today. Press "B" on the elevator. The lab is located at the first door on the left as you exit the elevator.  You have been scheduled for a CT scan of the chest at [facility] ([address]---this is in the same building as Press [facility]).   You are scheduled on 09/19/18 at 9 am. You should arrive 15 minutes prior to your appointment time for registration. Please follow the written instructions below on the day of your exam:  WARNING: IF YOU ARE ALLERGIC TO IODINE/X-RAY DYE, PLEASE NOTIFY RADIOLOGY IMMEDIATELY AT [phone]! YOU WILL BE GIVEN A 13 HOUR PREMEDICATION PREP.  1) Do not eat anything after 7 am (2 hours prior to your test)  This test typically takes 30-45 minutes to complete.  If you have any questions regarding your exam or if you need to reschedule, you may call the CT department at [phone] between the hours of 8:00 am and 5:00 pm, Monday-Friday.  Interventional Radiology will be contacting you to set up a liver biopsy.  You have been referred to the [facility].  They will contact you with an appointment.  Thank you for choosing me and [facility].   Tye Savoy, NP   ________________________________________________________________________

## 2018-09-18 NOTE — Progress Notes (Signed)
I agree with the above note and plan, TNTC liver metastatic lesions in his liver. Unclear primary but 2018 colonoscopy points away from colorectal cancer.  Planning labs, CT chest, liver mass biopsy, med onc referral. MAy need GI testing (EGD +/- colonoscopy) depending on the results of the above.

## 2018-09-19 ENCOUNTER — Inpatient Hospital Stay (HOSPITAL_COMMUNITY)
Admission: EM | Admit: 2018-09-19 | Discharge: 2018-09-25 | DRG: 374 | Disposition: A | Discharge status: Home / Self Care | Payer: Medicare Other | Source: Ambulatory Visit | Attending: Internal Medicine | Admitting: Internal Medicine

## 2018-09-19 ENCOUNTER — Encounter (HOSPITAL_COMMUNITY): Payer: Self-pay

## 2018-09-19 ENCOUNTER — Telehealth: Payer: Self-pay

## 2018-09-19 ENCOUNTER — Telehealth: Payer: Self-pay | Admitting: Hematology

## 2018-09-19 ENCOUNTER — Encounter: Payer: Self-pay | Admitting: Hematology

## 2018-09-19 ENCOUNTER — Ambulatory Visit (INDEPENDENT_AMBULATORY_CARE_PROVIDER_SITE_OTHER)
Admission: RE | Admit: 2018-09-19 | Discharge: 2018-09-19 | Disposition: A | Discharge status: Home / Self Care | Payer: Medicare Other | Source: Ambulatory Visit | Attending: Nurse Practitioner | Admitting: Nurse Practitioner

## 2018-09-19 DIAGNOSIS — D4989 Neoplasm of unspecified behavior of other specified sites: Secondary | ICD-10-CM

## 2018-09-19 DIAGNOSIS — R109 Unspecified abdominal pain: Secondary | ICD-10-CM

## 2018-09-19 DIAGNOSIS — C787 Secondary malignant neoplasm of liver and intrahepatic bile duct: Secondary | ICD-10-CM

## 2018-09-19 DIAGNOSIS — C169 Malignant neoplasm of stomach, unspecified: Secondary | ICD-10-CM | POA: Diagnosis not present

## 2018-09-19 DIAGNOSIS — R110 Nausea: Secondary | ICD-10-CM

## 2018-09-19 DIAGNOSIS — R634 Abnormal weight loss: Secondary | ICD-10-CM | POA: Diagnosis present

## 2018-09-19 DIAGNOSIS — R160 Hepatomegaly, not elsewhere classified: Secondary | ICD-10-CM

## 2018-09-19 DIAGNOSIS — Z8546 Personal history of malignant neoplasm of prostate: Secondary | ICD-10-CM

## 2018-09-19 DIAGNOSIS — I2699 Other pulmonary embolism without acute cor pulmonale: Secondary | ICD-10-CM | POA: Diagnosis present

## 2018-09-19 DIAGNOSIS — I10 Essential (primary) hypertension: Secondary | ICD-10-CM | POA: Diagnosis present

## 2018-09-19 DIAGNOSIS — Z79899 Other long term (current) drug therapy: Secondary | ICD-10-CM

## 2018-09-19 DIAGNOSIS — Z6823 Body mass index (BMI) 23.0-23.9, adult: Secondary | ICD-10-CM

## 2018-09-19 DIAGNOSIS — R933 Abnormal findings on diagnostic imaging of other parts of digestive tract: Secondary | ICD-10-CM | POA: Diagnosis not present

## 2018-09-19 DIAGNOSIS — R945 Abnormal results of liver function studies: Secondary | ICD-10-CM | POA: Diagnosis not present

## 2018-09-19 DIAGNOSIS — K259 Gastric ulcer, unspecified as acute or chronic, without hemorrhage or perforation: Secondary | ICD-10-CM | POA: Diagnosis present

## 2018-09-19 DIAGNOSIS — Z923 Personal history of irradiation: Secondary | ICD-10-CM

## 2018-09-19 DIAGNOSIS — D6869 Other thrombophilia: Secondary | ICD-10-CM | POA: Diagnosis present

## 2018-09-19 DIAGNOSIS — Z87891 Personal history of nicotine dependence: Secondary | ICD-10-CM

## 2018-09-19 DIAGNOSIS — D638 Anemia in other chronic diseases classified elsewhere: Secondary | ICD-10-CM | POA: Diagnosis present

## 2018-09-19 DIAGNOSIS — R932 Abnormal findings on diagnostic imaging of liver and biliary tract: Secondary | ICD-10-CM

## 2018-09-19 DIAGNOSIS — I272 Pulmonary hypertension, unspecified: Secondary | ICD-10-CM | POA: Diagnosis present

## 2018-09-19 LAB — BASIC METABOLIC PANEL
Anion gap: 9 (ref 5–15)
BUN: 12 mg/dL (ref 8–23)
CO2: 26 mmol/L (ref 22–32)
Calcium: 8.9 mg/dL (ref 8.9–10.3)
Chloride: 99 mmol/L (ref 98–111)
Creatinine, Ser: 1.01 mg/dL (ref 0.61–1.24)
GFR calc Af Amer: >60 mL/min (ref >60)
GFR calc non Af Amer: >60 mL/min (ref >60)
Glucose, Bld: 82 mg/dL (ref 70–99)
Potassium: 3.9 mmol/L (ref 3.5–5.1)
SODIUM: 134 mmol/L — AB (ref 135–145)

## 2018-09-19 LAB — CBC
HCT: 35.2 % — ABNORMAL LOW (ref 39.0–52.0)
Hemoglobin: 11 g/dL — ABNORMAL LOW (ref 13.0–17.0)
MCH: 29.2 pg (ref 26.0–34.0)
MCHC: 31.3 g/dL (ref 30.0–36.0)
MCV: 93.4 fL (ref 80.0–100.0)
Platelets: 322 10*3/uL (ref 150–400)
RBC: 3.77 MIL/uL — ABNORMAL LOW (ref 4.22–5.81)
RDW: 13.2 % (ref 11.5–15.5)
WBC: 5.2 10*3/uL (ref 4.0–10.5)
nRBC: 0 % (ref 0.0–0.2)

## 2018-09-19 LAB — CEA: CEA: 1318.2 ng/mL — ABNORMAL HIGH

## 2018-09-19 LAB — PROTIME-INR
INR: 1.33
Prothrombin Time: 16.4 seconds — ABNORMAL HIGH (ref 11.4–15.2)

## 2018-09-19 LAB — I-STAT TROPONIN, ED: TROPONIN I, POC: 0 ng/mL (ref 0.00–0.08)

## 2018-09-19 LAB — CANCER ANTIGEN 19-9: CA 19-9: 67479 U/mL — ABNORMAL HIGH (ref <34)

## 2018-09-19 LAB — AFP TUMOR MARKER: AFP-Tumor Marker: 2.9 ng/mL (ref <6.1)

## 2018-09-19 MED ORDER — CHOLECALCIFEROL 25 MCG (1000 UT) PO TABS: 1000.0000 [IU] | ORAL_TABLET | Freq: Every day | ORAL | Status: DC

## 2018-09-19 MED ORDER — HEPARIN BOLUS VIA INFUSION
4000.0000 [IU] | Freq: Once | INTRAVENOUS | Status: AC
  Administered 2018-09-19: 4000 [IU] via INTRAVENOUS
  Filled 2018-09-19: qty 4000

## 2018-09-19 MED ORDER — PROMETHAZINE-CODEINE 6.25-10 MG/5ML PO SYRP: 5.0000 mL | ORAL_SOLUTION | ORAL | Status: DC | PRN

## 2018-09-19 MED ORDER — IBUPROFEN 600 MG PO TABS: 600.0000 mg | ORAL_TABLET | Freq: Four times a day (QID) | ORAL | Status: DC | PRN

## 2018-09-19 MED ORDER — LATANOPROST 0.005 % OP SOLN
1.0000 [drp] | Freq: Every day | OPHTHALMIC | Status: DC
  Administered 2018-09-19 – 2018-09-24 (×6): 1 [drp] via OPHTHALMIC
  Filled 2018-09-19: qty 2.5

## 2018-09-19 MED ORDER — AMLODIPINE BESYLATE 2.5 MG PO TABS
5.0000 mg | ORAL_TABLET | Freq: Every day | ORAL | Status: DC
  Administered 2018-09-19 – 2018-09-25 (×7): 5 mg via ORAL
  Filled 2018-09-19 (×7): qty 2

## 2018-09-19 MED ORDER — ONDANSETRON HCL 4 MG/2ML IJ SOLN
4.0000 mg | Freq: Four times a day (QID) | INTRAMUSCULAR | Status: DC | PRN
  Administered 2018-09-20 – 2018-09-24 (×8): 4 mg via INTRAVENOUS
  Filled 2018-09-19 (×8): qty 2

## 2018-09-19 MED ORDER — BENAZEPRIL HCL 40 MG PO TABS
40.0000 mg | ORAL_TABLET | Freq: Every day | ORAL | Status: DC
  Administered 2018-09-20 – 2018-09-25 (×6): 40 mg via ORAL
  Filled 2018-09-19 (×8): qty 1

## 2018-09-19 MED ORDER — IOPAMIDOL (ISOVUE-300) INJECTION 61%
80.0000 mL | Freq: Once | INTRAVENOUS | Status: AC | PRN
  Administered 2018-09-19: 80 mL via INTRAVENOUS

## 2018-09-19 MED ORDER — HEPARIN (PORCINE) 25000 UT/250ML-% IV SOLN
1300.0000 [IU]/h | INTRAVENOUS | Status: DC
  Administered 2018-09-19: 1100 [IU]/h via INTRAVENOUS
  Filled 2018-09-19: qty 250

## 2018-09-19 NOTE — Progress Notes (Signed)
ANTICOAGULATION CONSULT NOTE - Initial Consult  Pharmacy Consult for heparin Indication: pulmonary embolus  No Known Allergies  Patient Measurements:   Heparin Dosing Weight: 68 kg   Vital Signs: Temp: 98 F (36.7 C) (01/09 1320) Temp Source: Oral (01/09 1320) BP: 135/82 (01/09 1715) Pulse Rate: 61 (01/09 1715)  Labs: Recent Labs    09/19/18 1323 09/19/18 1558  HGB 11.0*  --   HCT 35.2*  --   PLT 322  --   LABPROT  --  16.4*  INR  --  1.33  CREATININE 1.01  --     Estimated Creatinine Clearance: 49.9 mL/min (by C-G formula based on SCr of 1.01 mg/dL).   Medical History: Past Medical History:  Diagnosis Date  . Arthritis   . Diverticulosis of colon   . HTN (hypertension)   . Hx of colonic polyp   . Pneumonia    as a child/baby  . Poor circulation of extremity    right leg  . Prostate cancer [facility]) 2015   prostate cancer - radiation treated    Medications:  (Not in a hospital admission)   Assessment: 59 YOM with epigastric abdominal pain found to have a new PE on outpatient chest CT. Of note, pt was also noted to have hepatic masses. Pharmacy consulted to start IV heparin in case patient need surgery. Not on any anticoagulation at home. H/H slightly low, Plt wnl.   Goal of Therapy:  Heparin level 0.3-0.7 units/ml Monitor platelets by anticoagulation protocol: Yes   Plan:  -Heparin 4000 units IV bolus, then start IV heparin at 1100 units/hr -F/u 8 hr HL -Monitor daily HL, CBC and s/s of bleeding  Albertina Parr, PharmD., BCPS Clinical Pharmacist Clinical phone for 09/19/18 until 11pm: [phone] If after 11pm, please refer to [facility] for unit-specific pharmacist

## 2018-09-19 NOTE — Telephone Encounter (Signed)
  Oncology Nurse Navigator Documentation   Left VM on mobile phone requesting a call back to schedule initial consult with Dr. Burr Medico.

## 2018-09-19 NOTE — ED Provider Notes (Addendum)
[facility] EMERGENCY DEPARTMENT Provider Note   CSN: [id number] Arrival date & time: 09/19/18  1314     History   Chief Complaint Chief Complaint  Patient presents with  . Abdominal Pain    HPI Peter Wood is a 84 y.o. male.  He has been troubled by epigastric abdominal pain radiating to his right side since just after Christmas.  He was here in the ED and had a CT that showed some hepatic masses.  He is followed up outpatient with GI and they ordered him an outpatient CT chest.  He had that study today and they called him to come to the emergency department because he had blood clot.  He still has 5 out of 10 upper abdominal pain sometimes with some nausea.  No vomiting no diarrhea no fevers or chills.  He also has no chest pain and no shortness of breath and no lower extremity edema or pain.  He is never had a blood clot before and is not on anticoagulation.  The history is provided by the patient.  Abdominal Pain  Pain location:  Epigastric Pain quality: aching   Pain severity:  Moderate Onset quality:  Gradual Duration:  2 weeks Timing:  Constant Progression:  Waxing and waning Chronicity:  New Context: not eating, not recent illness and not recent travel   Relieved by:  Nothing Worsened by:  Nothing Ineffective treatments:  None tried Associated symptoms: no chest pain, no cough, no dysuria, no fever, no hematuria, no nausea, no shortness of breath, no sore throat and no vomiting     Past Medical History:  Diagnosis Date  . Arthritis   . Diverticulosis of colon   . HTN (hypertension)   . Hx of colonic polyp   . Pneumonia    as a child/baby  . Poor circulation of extremity    right leg  . Prostate cancer [facility]) 2015   prostate cancer - radiation treated    Patient Active Problem List   Diagnosis Date Noted  . Upper respiratory infection 08/01/2018  . Varicose vein of leg 07/22/2018  . Weight loss 01/18/2018  . Claudication ([place])  01/16/2017  . Macular hole, right 09/19/2016  . Macular hole, right eye 09/19/2016  . Right sciatic nerve pain 07/19/2016  . Finger mass, right 06/24/2015  . Prostate cancer ([place]) 09/16/2013  . Rash and nonspecific skin eruption 02/12/2013  . Well adult exam 09/01/2011  . TOBACCO USE, QUIT 08/25/2009  . ERECTILE DYSFUNCTION 03/03/2009  . PSA, INCREASED 03/03/2009  . Essential hypertension 06/10/2007  . DIVERTICULOSIS, COLON 06/10/2007  . COLONIC POLYPS, HX OF 06/10/2007    Past Surgical History:  Procedure Laterality Date  . [place] VITRECTOMY WITH 20 GAUGE MVR PORT FOR MACULAR HOLE Right 09/19/2016   Procedure: 25 GAUGE PARS PLANA VITRECTOMY WITH 20 GAUGE MVR PORT FOR MACULAR HOLE, MEMBRANE PEEL, SERUM PATCH, GAS FLUID EXCHANGE, HEADSCOPE LASER;  Surgeon: [name], MD;  Location: [facility];  Service: Ophthalmology;  Laterality: Right;  . CATARACT EXTRACTION  08/27/12   Right  . COLONOSCOPY    . HAND SURGERY  2016   right thumb  . KNEE SURGERY Left 1989        Home Medications    Prior to Admission medications   Medication Sig Start Date End Date Taking? Authorizing Provider  amLODipine (NORVASC) 5 MG tablet Take 1 tablet (5 mg total) daily by mouth. 07/20/17   Plotnikov, Evie Lacks, MD  azithromycin (ZITHROMAX Z-PAK) 250 MG tablet As directed 08/01/18   Plotnikov, Evie Lacks, MD  benazepril (LOTENSIN) 40 MG tablet TAKE ONE TABLET BY MOUTH ONE TIME DAILY  09/16/18   Plotnikov, Evie Lacks, MD  Cholecalciferol 1000 UNITS tablet Take 1,000 Units by mouth daily.     [provider]  latanoprost (XALATAN) 0.005 % ophthalmic solution Place 1 drop into both eyes at bedtime.  10/24/17   [provider]  oxyCODONE (ROXICODONE) 5 MG immediate release tablet Take 1 tablet (5 mg total) by mouth every 4 (four) hours as needed for severe pain. Patient not taking: Reported on 09/18/2018 09/07/18   Virgel Manifold, MD  promethazine-codeine [facility] WITH CODEINE)  6.25-10 MG/5ML syrup Take 5 mLs by mouth every 4 (four) hours as needed. Patient not taking: Reported on 09/18/2018 08/01/18   Plotnikov, Evie Lacks, MD    Family History Family History  Problem Relation Age of Onset  . Prostate cancer Father   . Hypertension Mother   . Breast cancer Sister     Social History Social History   Tobacco Use  . Smoking status: Former Smoker    Years: 4.00    Last attempt to quit: 09/12/1967    Years since quitting: 51.0  . Smokeless tobacco: Never Used  Substance Use Topics  . Alcohol use: No  . Drug use: No     Allergies   Patient has no known allergies.   Review of Systems Review of Systems  Constitutional: Negative for fever.  HENT: Negative for sore throat.   Eyes: Negative for visual disturbance.  Respiratory: Negative for cough and shortness of breath.   Cardiovascular: Negative for chest pain.  Gastrointestinal: Positive for abdominal pain. Negative for nausea and vomiting.  Genitourinary: Negative for dysuria and hematuria.  Musculoskeletal: Negative for back pain.  Skin: Negative for rash.  Neurological: Negative for headaches.     Physical Exam Updated Vital Signs BP 122/77   Pulse (!) 52   Temp 98 F (36.7 C) (Oral)   Resp 13   SpO2 100%   Physical Exam Vitals signs and nursing note reviewed.  Constitutional:      Appearance: He is well-developed.  HENT:     Head: Normocephalic and atraumatic.  Eyes:     Conjunctiva/sclera: Conjunctivae normal.  Neck:     Musculoskeletal: Neck supple.  Cardiovascular:     Rate and Rhythm: Normal rate and regular rhythm.     Heart sounds: No murmur.  Pulmonary:     Effort: Pulmonary effort is normal. No respiratory distress.     Breath sounds: Normal breath sounds.  Abdominal:     Palpations: Abdomen is soft.     Tenderness: There is abdominal tenderness in the epigastric area. There is no guarding or rebound.  Musculoskeletal: Normal range of motion.        General: No  swelling, tenderness, deformity or signs of injury.     Right lower leg: No edema.     Left lower leg: No edema.  Skin:    General: Skin is warm and dry.     Capillary Refill: Capillary refill takes less than 2 seconds.  Neurological:     General: No focal deficit present.     Mental Status: He is alert.     Motor: No weakness.      ED Treatments / Results  Labs (all labs ordered are listed, but only abnormal results are displayed) Labs Reviewed  BASIC METABOLIC PANEL - Abnormal; Notable  for the following components:      Result Value   Sodium 134 (*)    All other components within normal limits  CBC - Abnormal; Notable for the following components:   RBC 3.77 (*)    Hemoglobin 11.0 (*)    HCT 35.2 (*)    All other components within normal limits  PROTIME-INR - Abnormal; Notable for the following components:   Prothrombin Time 16.4 (*)    All other components within normal limits  BASIC METABOLIC PANEL  CBC  HEPARIN LEVEL (UNFRACTIONATED)  I-STAT TROPONIN, ED    EKG EKG Interpretation  Date/Time:  Thursday September 19 2018 13:37:16 EST Ventricular Rate:  62 PR Interval:  174 QRS Duration: 82 QT Interval:  384 QTC Calculation: 389 R Axis:   63 Text Interpretation:  Normal sinus rhythm Possible Left atrial enlargement Left ventricular hypertrophy Abnormal ECG No significant change since last tracing Confirmed by Gareth Morgan [phone]) on 09/19/2018 1:41:19 PM   Radiology Ct Chest W Contrast  Result Date: 09/19/2018 CLINICAL DATA:  Abnormal CT abdomen demonstrating hepatic metastases EXAM: CT CHEST WITH CONTRAST TECHNIQUE: Multidetector CT imaging of the chest was performed during intravenous contrast administration. Sagittal and coronal MPR images reconstructed from axial data set. CONTRAST:  80mL ISOVUE-300 IOPAMIDOL (ISOVUE-300) INJECTION 61% IV COMPARISON:  CT abdomen and pelvis 09/07/2018 FINDINGS: Cardiovascular: Minimal atherosclerotic calcification aorta. Aorta  normal caliber without aneurysm or dissection. Filling defects identified in RIGHT lower lobe pulmonary arteries consistent with pulmonary emboli. Remaining pulmonary arteries patent. Small pericardial effusion. Mediastinum/Nodes: Calcified mediastinal and RIGHT hilar lymph nodes. 2.5 x 1.8 cm diameter LEFT thyroid mass, low-attenuation. Base of cervical region otherwise unremarkable. Enlarged subcarinal lymph node 14 mm short axis image 75. No definite esophageal abnormalities. Lungs/Pleura: Minimal dependent atelectasis posterior RIGHT lower lobe. Questionable areas of scarring at lateral RIGHT middle lobe and at medial LEFT lower lobe. Calcified granulomata RIGHT upper lobe image 42 and LEFT upper lobe image 67. Noncalcified 4 mm RIGHT upper lobe nodule image 74. No acute infiltrate, pleural effusion or pneumothorax. Upper Abdomen: Numerous poorly defined hepatic masses compatible with widespread metastatic disease. Enlarged gastrohepatic ligament lymph node 15 mm short axis image 138. Questionable wall thickening of the proximal stomach versus artifact related underdistention; stomach had a similar appearance on the previous exam favor wall thickening, cannot exclude neoplasm. Probable enlarged periportal lymph nodes. Musculoskeletal: No osseous metastatic lesions. IMPRESSION: Hepatic metastatic disease. Enlarged gastrohepatic ligament, periportal, and subcarinal lymph nodes/adenopathy. Gastric wall thickening suspicious for gastric neoplasm extending to the gastroesophageal junction, recommend endoscopy. Old granulomatous disease within chest. Filling defects in RIGHT lower lobe pulmonary arteries consistent with pulmonary embolism. Small pericardial effusion. Low attenuation 2.4 cm diameter mass LEFT thyroid lobe, question cystic versus solid; follow-up thyroid sonography recommended. Aortic Atherosclerosis (ICD10-I70.0). Critical Value/emergent results were called by telephone at the time of interpretation on  09/19/2018 at 9:57 am to Tye Savoy NP, who verbally acknowledged these results. Electronically Signed   By: Lavonia Dana M.D.   On: 09/19/2018 10:00    Procedures .Critical Care Performed by: Hayden Rasmussen, MD Authorized by: Hayden Rasmussen, MD   Critical care provider statement:    Critical care time (minutes):  30   Critical care was necessary to treat or prevent imminent or life-threatening deterioration of the following conditions:  Circulatory failure (acute PE)   Critical care was time spent personally by me on the following activities:  Discussions with consultants, evaluation of patient's response to treatment, examination  of patient, ordering and performing treatments and interventions, ordering and review of laboratory studies, ordering and review of radiographic studies, pulse oximetry, re-evaluation of patient's condition, obtaining history from patient or surrogate, review of old charts and development of treatment plan with patient or surrogate   I assumed direction of critical care for this patient from another provider in my specialty: no     (including critical care time)  Medications Ordered in ED Medications - No data to display   Initial Impression / Assessment and Plan / ED Course  I have reviewed the triage vital signs and the nursing notes.  Pertinent labs & imaging results that were available during my care of the patient were reviewed by me and considered in my medical decision making (see chart for details).  Clinical Course as of Sep 19 2224  Thu Sep 19, 2018  1646 Discussed with  GI who recommends admitting the patient so he can have tissue diagnosis prior to starting anticoagulation.  They will see him tomorrow.   [MB]  4037 Discussed  with tried hospitalist will evaluate the patient for admission.  Patient and his wife were updated on the plan and all questions were answered.   [MB]    Clinical Course User Index [MB] Hayden Rasmussen, MD       Final Clinical Impressions(s) / ED Diagnoses   Final diagnoses:  Liver mass  Acute pulmonary embolism without acute cor pulmonale, unspecified pulmonary embolism type [facility])    ED Discharge Orders    None       Hayden Rasmussen, MD 09/19/18 2226    Hayden Rasmussen, MD 10/01/18 1021

## 2018-09-19 NOTE — ED Notes (Signed)
ED Provider at bedside. 

## 2018-09-19 NOTE — ED Triage Notes (Addendum)
Pt endorses left sided abd pain x 2 weeks with nausea. Denies vomiting, diarrhea or shob/cp. Sent here for "blood clot in my lung they saw on a  Scan I had this morning"

## 2018-09-19 NOTE — Telephone Encounter (Signed)
Received a phone call from Tye Savoy, NP -patient's CT chest results show an embolism-ordered for patient to go immediately to the ER (of choice) for evaluation and care concerning this incidental finding;  Called and spoke with patient and patient's family member-patient advised of CT chest result findings pulmonary embolism and explained to the patient that the finding was incidental and he needs to be seen in the Emergency Department immediately; Patient verbalized understanding of information/instructions; Patient was advised to call back if questions/concerns arise;

## 2018-09-19 NOTE — Telephone Encounter (Signed)
New referral received from [facility] for liver mass. Pt has been scheduled to see Dr. Burr Medico on 1/21 at 230pm. Letter mailed.

## 2018-09-19 NOTE — Consult Note (Signed)
[facility] Consult: 4:56 PM 09/19/2018  LOS: 0 days    Referring Provider: Dr Melina Copa  Primary Care Physician:  Alain Marion Evie Lacks, MD Primary Gastroenterologist:  Dr. Ardis Hughs     Reason for Consultation:  Liver masses,  New PE.     HPI: Peter Wood is a 84 y.o. male.  PMH hypertension.  Prostate cancer treated with radiation in 2015. Colonoscopy in 04/2017.  For polyp surveillance, adenomatous colon polyps 2002, 2013.  Study revealed left-sided diverticulosis and no recurrent polyps or cancers.  Patient evaluated at GI office by nurse practitioner on 09/18/2018 for abnormal LFTs and concerning CT scan, anorexia with 25 pound weight loss over 6 months.  On 09/07/2018 he developed left lower abdominal pain that eventually migrated to the mid abdomen and up into the epigastric area.  He felt a knot in the epigastrium.  It started vaguely on Friday but it was acute on Saturday morning so he was advised by MD on call to go to the ED, where he had abnormal LFTs.  Ultrasound and CT abdomen pelvis showed multiple liver lesions, concerning for metastatic disease.  CBD dilated.  Nurse practitioner ordered a CT of the chest 09/19/2016 for further staging. This confirms the hepatic mets.  There is gastric wall thickening stenting to the GE junction suspicious for gastric neoplasm.  Unexpected finding was that of a new right LL pulmonary embolus. He was admitted for management of the PE and further work-up of the gastric and liver findings. Heparin drip started last night but drip discontinued 1030 this morning at my orders to allow for EGD this afternoon. LFTs have not been rechecked but tumor markers have returned and are positive.  CA-19-9 is 67,479.  CEA is 1,318  Interestingly the patient has not had any cough,  shortness of breath or respiratory issues.  He moves his bowels daily, despite not eating as much as was his previous norm.  Family history negative for gastrointestinal cancers or problems.  His father died at 55 from prostate cancer and his younger brother died in his late 36s from prostate cancer.   Past Medical History:  Diagnosis Date  . Arthritis   . Diverticulosis of colon   . HTN (hypertension)   . Hx of colonic polyp   . Pneumonia    as a child/baby  . Poor circulation of extremity    right leg  . Prostate cancer [facility]) 2015   prostate cancer - radiation treated    Past Surgical History:  Procedure Laterality Date  . [place] VITRECTOMY WITH 20 GAUGE MVR PORT FOR MACULAR HOLE Right 09/19/2016   Procedure: 25 GAUGE PARS PLANA VITRECTOMY WITH 20 GAUGE MVR PORT FOR MACULAR HOLE, MEMBRANE PEEL, SERUM PATCH, GAS FLUID EXCHANGE, HEADSCOPE LASER;  Surgeon: [name], MD;  Location: [facility];  Service: Ophthalmology;  Laterality: Right;  . CATARACT EXTRACTION  08/27/12   Right  . COLONOSCOPY    . HAND SURGERY  2016   right thumb  . [place]  Prior to Admission medications   Medication Sig Start Date End Date Taking? Authorizing Provider  amLODipine (NORVASC) 5 MG tablet Take 1 tablet (5 mg total) daily by mouth. 07/20/17  Yes Plotnikov, Evie Lacks, MD  benazepril (LOTENSIN) 40 MG tablet TAKE ONE TABLET BY MOUTH ONE TIME DAILY  Patient taking differently: Take 40 mg by mouth daily.  09/16/18  Yes Plotnikov, Evie Lacks, MD  latanoprost (XALATAN) 0.005 % ophthalmic solution Place 1 drop into both eyes at bedtime.  10/24/17  Yes [provider]  naproxen sodium (ALEVE) 220 MG tablet Take 220-440 mg by mouth 2 (two) times daily as needed (for pain).   Yes [provider]  promethazine-codeine (PHENERGAN WITH CODEINE) 6.25-10 MG/5ML syrup Take 5 mLs by mouth every 4 (four) hours as needed. Patient taking differently: Take 5 mLs by mouth every 4  (four) hours as needed for cough.  08/01/18  Yes Plotnikov, Evie Lacks, MD  azithromycin (ZITHROMAX Z-PAK) 250 MG tablet As directed Patient not taking: Reported on 09/19/2018 08/01/18   Plotnikov, Evie Lacks, MD  Cholecalciferol 1000 UNITS tablet Take 1,000 Units by mouth daily.     [provider]  oxyCODONE (ROXICODONE) 5 MG immediate release tablet Take 1 tablet (5 mg total) by mouth every 4 (four) hours as needed for severe pain. Patient not taking: Reported on 09/19/2018 09/07/18   Virgel Manifold, MD    Scheduled Meds:  Infusions:  PRN Meds:    Allergies as of 09/19/2018  . (No Known Allergies)    Family History  Problem Relation Age of Onset  . Prostate cancer Father   . Hypertension Mother   . Breast cancer Sister     Social History   Socioeconomic History  . Marital status: Married    Spouse name: Not on file  . Number of children: 4  . Years of education: Not on file  . Highest education level: Not on file  Occupational History  . Occupation: retired  Scientific laboratory technician  . Financial resource strain: Not on file  . Food insecurity:    Worry: Not on file    Inability: Not on file  . Transportation needs:    Medical: Not on file    Non-medical: Not on file  Tobacco Use  . Smoking status: Former Smoker    Years: 4.00    Last attempt to quit: 09/12/1967    Years since quitting: 51.0  . Smokeless tobacco: Never Used  Substance and Sexual Activity  . Alcohol use: No  . Drug use: No  . Sexual activity: Not on file  Lifestyle  . Physical activity:    Days per week: Not on file    Minutes per session: Not on file  . Stress: Not on file  Relationships  . Social connections:    Talks on phone: Not on file    Gets together: Not on file    Attends religious service: Not on file    Active member of club or organization: Not on file    Attends meetings of clubs or organizations: Not on file    Relationship status: Not on file  . Intimate partner violence:     Fear of current or ex partner: Not on file    Emotionally abused: Not on file    Physically abused: Not on file    Forced sexual activity: Not on file  Other Topics Concern  . Not on file  Social History Narrative  . Not on file  REVIEW OF SYSTEMS: Constitutional: No lack of energy.  No weakness. ENT:  No nose bleeds Pulm: No shortness of breath or cough. CV:  No palpitations, no LE edema.  No chest pain GU:  No hematuria, no frequency GI: No dysphagia.  No constipation or diarrhea. Heme: No unusual bleeding or bruising. Transfusions: None. Neuro:  No headaches, no peripheral tingling or numbness Derm:  No itching, no rash or sores.  Endocrine:  No sweats or chills.  No polyuria or dysuria Immunization: Had his flu shot in early November 2019. Travel:  None beyond local counties in last few months.    PHYSICAL EXAM: Vital signs in last 24 hours: Vitals:   09/19/18 1545 09/19/18 1615  BP: 118/76 119/81  Pulse: (!) 54 (!) 57  Resp: 16 16  Temp:    SpO2: 100% 99%   Wt Readings from Last 3 Encounters:  09/18/18 68.7 kg  09/07/18 72.1 kg  08/01/18 71.7 kg    General: Very pleasant, calm, non-ill-appearing AAM.  Alert Head: No facial asymmetry or swelling.  No signs of head trauma Eyes: No scleral icterus.  Conjunctiva pale Ears: Not hard of hearing Nose: No congestion, no discharge Mouth: Good dental repair, upper dental bridge.  Tongue midline.  Oral mucosa moist, pink, clear. Neck: No JVD, no masses, no thyromegaly. Lungs: Clear bilaterally.  No labored breathing or cough. Heart: RRR.  No MRG.  S1, S2 present Abdomen: Soft without distention.  Active bowel sounds.  Nodular mass in the epigastric region.  This is not tender..   Rectal: Deferred Musc/Skeltl: No joint redness, swelling or significant deformities/contractures. Extremities: No CCE. Neurologic: Fully alert and oriented.  Moves all 4 limbs, no limb weakness.  No tremors.  No gross deficits. Skin: No  rash, no sores, no obvious jaundice. Tattoos: None observed. Nodes: No inguinal or cervical adenopathy. Psych: Pleasant, calm, cooperative, good historian, fluid speech.  Intake/Output from previous day: No intake/output data recorded. Intake/Output this shift: No intake/output data recorded.  LAB RESULTS: Recent Labs    09/19/18 1323  WBC 5.2  HGB 11.0*  HCT 35.2*  PLT 322   BMET Lab Results  Component Value Date   NA 134 (L) 09/19/2018   NA 138 09/07/2018   NA 140 07/22/2018   K 3.9 09/19/2018   K 3.9 09/07/2018   K 3.7 07/22/2018   CL 99 09/19/2018   CL 103 09/07/2018   CL 103 07/22/2018   CO2 26 09/19/2018   CO2 26 09/07/2018   CO2 30 07/22/2018   GLUCOSE 82 09/19/2018   GLUCOSE 93 09/07/2018   GLUCOSE 94 07/22/2018   BUN 12 09/19/2018   BUN 14 09/07/2018   BUN 14 07/22/2018   CREATININE 1.01 09/19/2018   CREATININE 1.03 09/07/2018   CREATININE 0.98 07/22/2018   CALCIUM 8.9 09/19/2018   CALCIUM 8.6 (L) 09/07/2018   CALCIUM 8.8 07/22/2018   LFT No results for input(s): PROT, ALBUMIN, AST, ALT, ALKPHOS, BILITOT, BILIDIR, IBILI in the last 72 hours. PT/INR Lab Results  Component Value Date   INR 1.33 09/19/2018   Hepatitis Panel No results for input(s): HEPBSAG, HCVAB, HEPAIGM, HEPBIGM in the last 72 hours. C-Diff No components found for: CDIFF Lipase     Component Value Date/Time   LIPASE 32 09/07/2018 1210    Drugs of Abuse  No results found for: LABOPIA, COCAINSCRNUR, LABBENZ, AMPHETMU, THCU, LABBARB   RADIOLOGY STUDIES: Ct Chest W Contrast  Result Date: 09/19/2018 CLINICAL DATA:  Abnormal CT abdomen demonstrating  hepatic metastases EXAM: CT CHEST WITH CONTRAST TECHNIQUE: Multidetector CT imaging of the chest was performed during intravenous contrast administration. Sagittal and coronal MPR images reconstructed from axial data set. CONTRAST:  64mL ISOVUE-300 IOPAMIDOL (ISOVUE-300) INJECTION 61% IV COMPARISON:  CT abdomen and pelvis 09/07/2018  FINDINGS: Cardiovascular: Minimal atherosclerotic calcification aorta. Aorta normal caliber without aneurysm or dissection. Filling defects identified in RIGHT lower lobe pulmonary arteries consistent with pulmonary emboli. Remaining pulmonary arteries patent. Small pericardial effusion. Mediastinum/Nodes: Calcified mediastinal and RIGHT hilar lymph nodes. 2.5 x 1.8 cm diameter LEFT thyroid mass, low-attenuation. Base of cervical region otherwise unremarkable. Enlarged subcarinal lymph node 14 mm short axis image 75. No definite esophageal abnormalities. Lungs/Pleura: Minimal dependent atelectasis posterior RIGHT lower lobe. Questionable areas of scarring at lateral RIGHT middle lobe and at medial LEFT lower lobe. Calcified granulomata RIGHT upper lobe image 42 and LEFT upper lobe image 67. Noncalcified 4 mm RIGHT upper lobe nodule image 74. No acute infiltrate, pleural effusion or pneumothorax. Upper Abdomen: Numerous poorly defined hepatic masses compatible with widespread metastatic disease. Enlarged gastrohepatic ligament lymph node 15 mm short axis image 138. Questionable wall thickening of the proximal stomach versus artifact related underdistention; stomach had a similar appearance on the previous exam favor wall thickening, cannot exclude neoplasm. Probable enlarged periportal lymph nodes. Musculoskeletal: No osseous metastatic lesions. IMPRESSION: Hepatic metastatic disease. Enlarged gastrohepatic ligament, periportal, and subcarinal lymph nodes/adenopathy. Gastric wall thickening suspicious for gastric neoplasm extending to the gastroesophageal junction, recommend endoscopy. Old granulomatous disease within chest. Filling defects in RIGHT lower lobe pulmonary arteries consistent with pulmonary embolism. Small pericardial effusion. Low attenuation 2.4 cm diameter mass LEFT thyroid lobe, question cystic versus solid; follow-up thyroid sonography recommended. Aortic Atherosclerosis (ICD10-I70.0). Critical  Value/emergent results were called by telephone at the time of interpretation on 09/19/2018 at 9:57 am to Tye Savoy NP, who verbally acknowledged these results. Electronically Signed   By: Lavonia Dana M.D.   On: 09/19/2018 10:00     IMPRESSION:   *    Extensive liver metastasis.  CT showing changes in the stomach to the GE junction, this may be the primary cancer. Elevated CA 19 9 and CEA.  *    Normocytic anemia.   Baseline Hgb 13, now 10.2 post rehydration.  Marland Kitchen      PLAN:     *   Recheck the patient's LFTs in the morning as the last check was on 12/28.  *    EGD with gastric biopsy set for ~2:30 PM today with Dr. Carlean Purl.  Situation and procedure discussed with Mr. Okane, he is calm and ready to proceed.   Azucena Freed  09/19/2018, 4:56 PM Phone [phone]

## 2018-09-19 NOTE — H&P (Signed)
History and Physical    Peter Wood CVE:[id number] DOB: 1935/07/10 DOA: 09/19/2018  PCP: Cassandria Anger, MD Consultants: None Patient coming from: Home- lives with wife  Chief Complaint: Abdominal pain, CT showing pulmonary embolism  HPI: Peter Wood is a 84 y.o. male with medical history significant for hypertension, prostate cancer status post radiation in 2015 who presented to the ED today after CT of the chest showed acute pulmonary embolism.  Patient reports he has had intermittent left-sided abdominal pain for approximately 10 days and has had a 25 pound weight loss in the past 6 months with associated anorexia.  In the ED on December 28 he had a CT abdomen pelvis with contrast showing multiple liver lesions and a possible porta hepatis mass.  He was seen by [facility] in clinic yesterday and had a CT of the chest for staging which revealed acute PE.  Currently patient denies any dyspnea, chest pain or any other symptoms other than continued waxing and waning dull, achy left mid to left upper abdominal pain.  He does have occasional nausea which can be severe but has had no vomiting.  No diarrhea or constipation.  He has poor appetite and early satiety.  He has no history of prior blood clots.  He smoked for 4 years in his 68s and has not had any since.  He is a nondrinker.  He has not had any recent travel and has no known risk factors for hypercoagulability.  Staging CT today showed hepatic metastatic disease, gastric wall thickening concerning for gastric neoplasm, with surrounding adenopathy, along with the acute PE in the right lower lobe pulmonary arteries.  Patient was called and instructed to report to the ED.  ED Course: Vital signs were stable normal SaO2, no dyspnea, no leg edema.  ED physician called [facility] who will see patient tomorrow.  Agreed with not starting any long-acting anticoagulation given likelihood of impending invasive procedure.  Review of Systems:  As per HPI; otherwise review of systems reviewed and negative.   Ambulatory Status:  Ambulates without assistance  Past Medical History:  Diagnosis Date  . Arthritis   . Diverticulosis of colon   . HTN (hypertension)   . Hx of colonic polyp   . Pneumonia    as a child/baby  . Poor circulation of extremity    right leg  . Prostate cancer [facility] 2015   prostate cancer - radiation treated    Past Surgical History:  Procedure Laterality Date  . [place] VITRECTOMY WITH 20 GAUGE MVR PORT FOR MACULAR HOLE Right 09/19/2016   Procedure: 25 GAUGE PARS PLANA VITRECTOMY WITH 20 GAUGE MVR PORT FOR MACULAR HOLE, MEMBRANE PEEL, SERUM PATCH, GAS FLUID EXCHANGE, HEADSCOPE LASER;  Surgeon: [name], MD;  Location: [facility];  Service: Ophthalmology;  Laterality: Right;  . CATARACT EXTRACTION  08/27/12   Right  . COLONOSCOPY    . HAND SURGERY  2016   right thumb  . KNEE SURGERY Left 1989    Social History   Socioeconomic History  . Marital status: Married    Spouse name: Not on file  . Number of children: 4  . Years of education: Not on file  . Highest education level: Not on file  Occupational History  . Occupation: retired  Scientific laboratory technician  . Financial resource strain: Not on file  . Food insecurity:    Worry: Not on file    Inability: Not on file  . Transportation  needs:    Medical: Not on file    Non-medical: Not on file  Tobacco Use  . Smoking status: Former Smoker    Years: 4.00    Last attempt to quit: 09/12/1967    Years since quitting: 51.0  . Smokeless tobacco: Never Used  Substance and Sexual Activity  . Alcohol use: No  . Drug use: No  . Sexual activity: Not on file  Lifestyle  . Physical activity:    Days per week: Not on file    Minutes per session: Not on file  . Stress: Not on file  Relationships  . Social connections:    Talks on phone: Not on file    Gets together: Not on file    Attends religious service: Not on file    Active member of club  or organization: Not on file    Attends meetings of clubs or organizations: Not on file    Relationship status: Not on file  . Intimate partner violence:    Fear of current or ex partner: Not on file    Emotionally abused: Not on file    Physically abused: Not on file    Forced sexual activity: Not on file  Other Topics Concern  . Not on file  Social History Narrative  . Not on file    No Known Allergies  Family History  Problem Relation Age of Onset  . Prostate cancer Father   . Hypertension Mother   . Breast cancer Sister   . Prostate cancer Brother     Prior to Admission medications   Medication Sig Start Date End Date Taking? Authorizing Provider  amLODipine (NORVASC) 5 MG tablet Take 1 tablet (5 mg total) daily by mouth. 07/20/17  Yes Plotnikov, Evie Lacks, MD  benazepril (LOTENSIN) 40 MG tablet TAKE ONE TABLET BY MOUTH ONE TIME DAILY  Patient taking differently: Take 40 mg by mouth daily.  09/16/18  Yes Plotnikov, Evie Lacks, MD  latanoprost (XALATAN) 0.005 % ophthalmic solution Place 1 drop into both eyes at bedtime.  10/24/17  Yes [provider]  naproxen sodium (ALEVE) 220 MG tablet Take 220-440 mg by mouth 2 (two) times daily as needed (for pain).   Yes [provider]  promethazine-codeine (PHENERGAN WITH CODEINE) 6.25-10 MG/5ML syrup Take 5 mLs by mouth every 4 (four) hours as needed. Patient taking differently: Take 5 mLs by mouth every 4 (four) hours as needed for cough.  08/01/18  Yes Plotnikov, Evie Lacks, MD  azithromycin (ZITHROMAX Z-PAK) 250 MG tablet As directed Patient not taking: Reported on 09/19/2018 08/01/18   Plotnikov, Evie Lacks, MD  Cholecalciferol 1000 UNITS tablet Take 1,000 Units by mouth daily.     [provider]  oxyCODONE (ROXICODONE) 5 MG immediate release tablet Take 1 tablet (5 mg total) by mouth every 4 (four) hours as needed for severe pain. Patient not taking: Reported on 09/19/2018 09/07/18   Virgel Manifold, MD     Physical Exam: Vitals:   09/19/18 1545 09/19/18 1615 09/19/18 1645 09/19/18 1715  BP: 118/76 119/81 130/83 135/82  Pulse: (!) 54 (!) 57 (!) 54 61  Resp: 16 16 16 16   Temp:      TempSrc:      SpO2: 100% 99% 98% 99%     . General: Appears calm and comfortable and is in NAD . Eyes:  PERRL, EOMI, normal lids, iris . ENT:  grossly normal hearing, lips & tongue, mmm . Neck:  supple, no lymphadenopathy .  Cardiovascular:  nL S1, S2, normal rate, reg rhythm, no murmur. Marland Kitchen Respiratory:   CTA bilaterally with no wheezes/rales/rhonchi.  Normal respiratory effort. . Abdomen:  soft, NT, mildly distended, NABS.  Tender to palpation in left mid and left upper abdomen which prevented full examination for masses . Back:   grossly normal alignment . Skin:  no rash or lesions seen on limited exam . Musculoskeletal:  grossly normal tone BUE/BLE, good ROM, no bony abnormality or obvious joint deformity . Lower extremities:  No LE edema, no calf tenderness. Limited foot exam with no ulcerations.  2+ distal pulses. Marland Kitchen Psychiatric:  grossly normal mood and affect, speech fluent and appropriate, AOx3 . Neurologic:  CN 2-12 grossly intact, moves all extremities in coordinated fashion, sensation intact, Patellar DTRs 2+ and symmetric    Radiological Exams on Admission: Ct Chest W Contrast  Result Date: 09/19/2018 CLINICAL DATA:  Abnormal CT abdomen demonstrating hepatic metastases EXAM: CT CHEST WITH CONTRAST TECHNIQUE: Multidetector CT imaging of the chest was performed during intravenous contrast administration. Sagittal and coronal MPR images reconstructed from axial data set. CONTRAST:  58mL ISOVUE-300 IOPAMIDOL (ISOVUE-300) INJECTION 61% IV COMPARISON:  CT abdomen and pelvis 09/07/2018 FINDINGS: Cardiovascular: Minimal atherosclerotic calcification aorta. Aorta normal caliber without aneurysm or dissection. Filling defects identified in RIGHT lower lobe pulmonary arteries consistent with pulmonary emboli.  Remaining pulmonary arteries patent. Small pericardial effusion. Mediastinum/Nodes: Calcified mediastinal and RIGHT hilar lymph nodes. 2.5 x 1.8 cm diameter LEFT thyroid mass, low-attenuation. Base of cervical region otherwise unremarkable. Enlarged subcarinal lymph node 14 mm short axis image 75. No definite esophageal abnormalities. Lungs/Pleura: Minimal dependent atelectasis posterior RIGHT lower lobe. Questionable areas of scarring at lateral RIGHT middle lobe and at medial LEFT lower lobe. Calcified granulomata RIGHT upper lobe image 42 and LEFT upper lobe image 67. Noncalcified 4 mm RIGHT upper lobe nodule image 74. No acute infiltrate, pleural effusion or pneumothorax. Upper Abdomen: Numerous poorly defined hepatic masses compatible with widespread metastatic disease. Enlarged gastrohepatic ligament lymph node 15 mm short axis image 138. Questionable wall thickening of the proximal stomach versus artifact related underdistention; stomach had a similar appearance on the previous exam favor wall thickening, cannot exclude neoplasm. Probable enlarged periportal lymph nodes. Musculoskeletal: No osseous metastatic lesions. IMPRESSION: Hepatic metastatic disease. Enlarged gastrohepatic ligament, periportal, and subcarinal lymph nodes/adenopathy. Gastric wall thickening suspicious for gastric neoplasm extending to the gastroesophageal junction, recommend endoscopy. Old granulomatous disease within chest. Filling defects in RIGHT lower lobe pulmonary arteries consistent with pulmonary embolism. Small pericardial effusion. Low attenuation 2.4 cm diameter mass LEFT thyroid lobe, question cystic versus solid; follow-up thyroid sonography recommended. Aortic Atherosclerosis (ICD10-I70.0). Critical Value/emergent results were called by telephone at the time of interpretation on 09/19/2018 at 9:57 am to Tye Savoy NP, who verbally acknowledged these results. Electronically Signed   By: Lavonia Dana M.D.   On: 09/19/2018  10:00    EKG: Independently reviewed. Rate 62 Normal sinus rhythm Possible Left atrial enlargement Left ventricular hypertrophy Abnormal ECG No significant change since last tracing   Labs on Admission: I have personally reviewed the available labs and imaging studies at the time of the admission.  Pertinent labs:  Sodium 134 potassium 3.9 chloride 99 CO2 26 glucose 82 BUN 12 creatinine 1.01 calcium 8.9 Troponin 0 WBC 5.2 hemoglobin 11 platelets 322 PT 16.4, INR 1.33 LFTs from 09/07/2018: AST 61 ALT 59 total bilirubin 1.4 alkaline phosphatase 134, lipase 32 AFP 2.9 CA-19-9: 67,479 CEA 1319.2 PSA 0.54   Assessment/Plan Principal Problem:  Pulmonary emboli (HCC) Active Problems:   Essential hypertension   Weight loss   Liver metastases (HCC)   Abdominal neoplasm without bowel obstruction   Nausea   Abdominal pain    Acute pulmonary embolus: likely hypercoagulable state due to underlying malignancy; pt has no prior DVT/PE and has no other known risk factors (ie no recent travel, no recent surgery, non smoker). Vitals stable, no hypoxia, no tachypnea, no s/s right heart strain. -admit to inpatient, start heparin drip per pharmacy; pt will ultimately need transition to longer acting anticoagulant but given the likelihood of impending procedure given need for tissue for pathology for apparent malignancy, will only do heparin gtt for now and turn off prior to procedure -check TTE, LE dopplers  Abdominal mass with hepatic metastases, unknown primary but CT scan today suggests potentially gastric neoplasm. With significant weight loss x 6 mo. CA 19-9 and CEA markedly elevated. AFP and PSA WNL. -NPO after MN for possible endoscopic procedure / biopsy -appreciate [facility] assistance -monitor LFTs, BMP  HTN -cont home meds norvasc, lotensin   DVT prophylaxis: Heparin gtt Code Status:  Full - confirmed with patient/family Family Communication: Wife at bedside Disposition  Plan:  Home once clinically improved Consults called: [facility] Admission status: Admit - It is my clinical opinion that admission to INPATIENT is reasonable and necessary because of the expectation that this patient will require hospital care that crosses at least 2 midnights to treat this condition based on the medical complexity of the problems presented.  Given the aforementioned information, the predictability of an adverse outcome is felt to be significant.     Janora Norlander MD [facility]  If note is complete, please contact covering daytime or nighttime physician. [email or web address] Password [facility]  09/19/2018, 5:52 PM

## 2018-09-19 NOTE — Progress Notes (Signed)
Patient arrived to unit, ambulated to unit bed. No complaints of pain.

## 2018-09-19 NOTE — ED Notes (Signed)
RN able to call report. Bed not ready yet. 3E RN to call when bed is ready.

## 2018-09-20 ENCOUNTER — Inpatient Hospital Stay (HOSPITAL_COMMUNITY): Payer: Medicare Other | Admitting: Anesthesiology

## 2018-09-20 ENCOUNTER — Inpatient Hospital Stay (HOSPITAL_COMMUNITY): Payer: Medicare Other

## 2018-09-20 ENCOUNTER — Telehealth: Payer: Self-pay | Admitting: Hematology

## 2018-09-20 ENCOUNTER — Encounter (HOSPITAL_COMMUNITY): Payer: Self-pay | Admitting: General Practice

## 2018-09-20 ENCOUNTER — Other Ambulatory Visit: Payer: Self-pay

## 2018-09-20 ENCOUNTER — Encounter (HOSPITAL_COMMUNITY)
Admission: EM | Disposition: A | Discharge status: Home / Self Care | Payer: Self-pay | Source: Ambulatory Visit | Attending: Family Medicine

## 2018-09-20 DIAGNOSIS — I2699 Other pulmonary embolism without acute cor pulmonale: Secondary | ICD-10-CM | POA: Diagnosis present

## 2018-09-20 DIAGNOSIS — I361 Nonrheumatic tricuspid (valve) insufficiency: Secondary | ICD-10-CM | POA: Diagnosis not present

## 2018-09-20 HISTORY — PX: BIOPSY: SHX5522

## 2018-09-20 HISTORY — PX: ESOPHAGOGASTRODUODENOSCOPY (EGD) WITH PROPOFOL: SHX5813

## 2018-09-20 LAB — BASIC METABOLIC PANEL
Anion gap: 9 (ref 5–15)
BUN: 9 mg/dL (ref 8–23)
CO2: 26 mmol/L (ref 22–32)
Calcium: 8.6 mg/dL — ABNORMAL LOW (ref 8.9–10.3)
Chloride: 101 mmol/L (ref 98–111)
Creatinine, Ser: 0.96 mg/dL (ref 0.61–1.24)
GFR calc Af Amer: >60 mL/min (ref >60)
GFR calc non Af Amer: >60 mL/min (ref >60)
Glucose, Bld: 84 mg/dL (ref 70–99)
Potassium: 3.8 mmol/L (ref 3.5–5.1)
Sodium: 136 mmol/L (ref 135–145)

## 2018-09-20 LAB — HEPARIN LEVEL (UNFRACTIONATED): Heparin Unfractionated: 0.16 IU/mL — ABNORMAL LOW (ref 0.30–0.70)

## 2018-09-20 LAB — CBC
HCT: 32.5 % — ABNORMAL LOW (ref 39.0–52.0)
Hemoglobin: 10.2 g/dL — ABNORMAL LOW (ref 13.0–17.0)
MCH: 28.3 pg (ref 26.0–34.0)
MCHC: 31.4 g/dL (ref 30.0–36.0)
MCV: 90 fL (ref 80.0–100.0)
Platelets: 271 10*3/uL (ref 150–400)
RBC: 3.61 MIL/uL — ABNORMAL LOW (ref 4.22–5.81)
RDW: 13.1 % (ref 11.5–15.5)
WBC: 4.8 10*3/uL (ref 4.0–10.5)
nRBC: 0 % (ref 0.0–0.2)

## 2018-09-20 SURGERY — ESOPHAGOGASTRODUODENOSCOPY (EGD) WITH PROPOFOL
Anesthesia: Monitor Anesthesia Care

## 2018-09-20 MED ORDER — HEPARIN BOLUS VIA INFUSION
2000.0000 [IU] | Freq: Once | INTRAVENOUS | Status: AC
  Administered 2018-09-20: 2000 [IU] via INTRAVENOUS
  Filled 2018-09-20: qty 2000

## 2018-09-20 MED ORDER — LIDOCAINE HCL (CARDIAC) PF 100 MG/5ML IV SOSY
PREFILLED_SYRINGE | INTRAVENOUS | Status: DC | PRN
  Administered 2018-09-20: 100 mg via INTRAVENOUS

## 2018-09-20 MED ORDER — PROPOFOL 500 MG/50ML IV EMUL
INTRAVENOUS | Status: DC | PRN
  Administered 2018-09-20: 100 ug/kg/min via INTRAVENOUS

## 2018-09-20 MED ORDER — PROPOFOL 10 MG/ML IV BOLUS
INTRAVENOUS | Status: DC | PRN
  Administered 2018-09-20: 20 mg via INTRAVENOUS
  Administered 2018-09-20: 30 mg via INTRAVENOUS
  Administered 2018-09-20 (×2): 20 mg via INTRAVENOUS

## 2018-09-20 MED ORDER — HEPARIN (PORCINE) 25000 UT/250ML-% IV SOLN
1550.0000 [IU]/h | INTRAVENOUS | Status: AC
  Administered 2018-09-21: 1300 [IU]/h via INTRAVENOUS
  Administered 2018-09-22: 1450 [IU]/h via INTRAVENOUS
  Administered 2018-09-23: 1500 [IU]/h via INTRAVENOUS
  Administered 2018-09-23: 1450 [IU]/h via INTRAVENOUS
  Administered 2018-09-24 – 2018-09-25 (×2): 1550 [IU]/h via INTRAVENOUS
  Filled 2018-09-20 (×6): qty 250

## 2018-09-20 MED ORDER — PANTOPRAZOLE SODIUM 40 MG PO TBEC
40.0000 mg | DELAYED_RELEASE_TABLET | Freq: Two times a day (BID) | ORAL | Status: DC
  Administered 2018-09-20 – 2018-09-25 (×10): 40 mg via ORAL
  Filled 2018-09-20 (×4): qty 1
  Filled 2018-09-20: qty 2
  Filled 2018-09-20 (×5): qty 1

## 2018-09-20 MED ORDER — LACTATED RINGERS IV SOLN
INTRAVENOUS | Status: DC | PRN
  Administered 2018-09-20: 15:00:00 via INTRAVENOUS

## 2018-09-20 SURGICAL SUPPLY — 15 items

## 2018-09-20 NOTE — Progress Notes (Signed)
ANTICOAGULATION CONSULT NOTE - Follow Up Consult  Pharmacy Consult for heparin Indication: pulmonary embolus  Labs: Recent Labs    09/19/18 1323 09/19/18 1558 09/20/18 0228  HGB 11.0*  --  10.2*  HCT 35.2*  --  32.5*  PLT 322  --  271  LABPROT  --  16.4*  --   INR  --  1.33  --   HEPARINUNFRC  --   --  0.16*  CREATININE 1.01  --  0.96    Assessment: 83yo male subtherapeutic on heparin with initial; dosing for PE.   S/p upper endoscopy today, found giant cratered nonbleeding gastric ulcer concerning for cancer, biopsy taken. Orders to resume heparin in am, given very high bleeding risk will not bolus and restart at previous rate.   Goal of Therapy:  Heparin level 0.3-0.7 units/ml   Plan:  Restart heparin at 1300 units/hr at 8am tomorrow 8 hour heparin level after restart  Erin Hearing PharmD., BCPS Clinical Pharmacist 09/20/2018 4:48 PM

## 2018-09-20 NOTE — Anesthesia Postprocedure Evaluation (Signed)
Anesthesia Post Note  Patient: Peter Wood  Procedure(s) Performed: ESOPHAGOGASTRODUODENOSCOPY (EGD) WITH PROPOFOL (N/A ) BIOPSY     Patient location during evaluation: PACU Anesthesia Type: MAC Level of consciousness: awake and alert Pain management: pain level controlled Vital Signs Assessment: post-procedure vital signs reviewed and stable Respiratory status: spontaneous breathing, nonlabored ventilation and respiratory function stable Cardiovascular status: blood pressure returned to baseline and stable Postop Assessment: no apparent nausea or vomiting Anesthetic complications: no    Last Vitals:  Vitals:   09/20/18 1610 09/20/18 1620  BP: 139/80   Pulse: (!) 54 (!) 58  Resp: (!) 22 14  Temp:    SpO2: 100% 95%    Last Pain:  Vitals:   09/20/18 1620  TempSrc:   PainSc: 0-No pain                 Lidia Collum

## 2018-09-20 NOTE — Progress Notes (Signed)
Bilateral lower extremity venous duplex has been completed. Preliminary results can be found in CV Proc through chart review.   09/20/18 11:42 AM Peter Wood RVT

## 2018-09-20 NOTE — Transfer of Care (Signed)
Immediate Anesthesia Transfer of Care Note  Patient: Peter Wood  Procedure(s) Performed: ESOPHAGOGASTRODUODENOSCOPY (EGD) WITH PROPOFOL (N/A ) BIOPSY  Patient Location: Endoscopy Unit  Anesthesia Type:MAC  Level of Consciousness: awake and alert   Airway & Oxygen Therapy: Patient Spontanous Breathing and Patient connected to nasal cannula oxygen  Post-op Assessment: Report given to RN, Post -op Vital signs reviewed and stable and Patient moving all extremities  Post vital signs: Reviewed and stable  Last Vitals:  Vitals Value Taken Time  BP 169/89 09/20/2018  3:43 PM  Temp 37.1 C 09/20/2018  3:43 PM  Pulse 58 09/20/2018  3:47 PM  Resp 20 09/20/2018  3:47 PM  SpO2 100 % 09/20/2018  3:47 PM  Vitals shown include unvalidated device data.  Last Pain:  Vitals:   09/20/18 1543  TempSrc: Oral  PainSc: 0-No pain         Complications: No apparent anesthesia complications

## 2018-09-20 NOTE — Care Management CC44 (Signed)
Condition Code 44 Documentation Completed  Patient Details  Name: Peter Wood MRN: [id number] Date of Birth: 1935/05/01   Condition Code 44 given:  Yes Patient signature on Condition Code 44 notice:  Yes Documentation of 2 MD's agreement:  Yes Code 44 added to claim:  Yes    Royston Bake, RN 09/20/2018, 4:47 PM

## 2018-09-20 NOTE — Care Management (Signed)
Per Cherlyn Labella. With [facility]. Co-pay amount for Xarelto 15mg .twice a day.$40.00 for a 30 day supply retail pharmacy. Mail order with [facility] $80.00 for 90 day supply of Xeralto. Co-pay for Eliquis 5 mg.twice a day$40.00 for 30 day supply. Mail order price for Eliquis 90 day supply thru [facility]. Is $80.00. No PA required ,retail pharmacies are: [facility]. Ref.# [id number].

## 2018-09-20 NOTE — Progress Notes (Signed)
  Echocardiogram 2D Echocardiogram has been performed.  Darlina Sicilian M 09/20/2018, 11:37 AM

## 2018-09-20 NOTE — Telephone Encounter (Signed)
Spoke to the pt's wife who states that her husband would like to see Dr. Jana Hakim instead of Dr. Burr Medico. I notified the GI Navigator of the pt's request as well as emailed Dr. Jana Hakim.

## 2018-09-20 NOTE — Progress Notes (Signed)
PROGRESS NOTE  Peter Wood  NWG:[id number] DOB: [birth year] DOA: 09/19/2018 PCP: Cassandria Anger, MD  Outpatient Specialists: GI, Dr. Carlean Purl. Brief Narrative: Peter Wood is an 84 y.o. male with a history of prostate CA s/p XRT 2015 and HTN who presented to the ED due to incidental finding of PO on CT chest. He's been undergoing work up for multiple hepatic lesions found on CT 09/07/2018 for evaluation of abdominal pain and 25lbs weight loss. After following up with GI 1/8 he had a staging CT chest which revealed a largely asymptomatic pulmonary embolism. He was told to present to the ED and was subsequently admitted on heparin infusion, made NPO and had EGD performed 1/10 showing a giant non-bleeding gastric ulcer which was biopsied.   Assessment & Plan: Principal Problem:   Pulmonary emboli (HCC) Active Problems:   Essential hypertension   Weight loss   Liver metastases (HCC)   Abdominal neoplasm without bowel obstruction   Nausea   Abdominal pain  Acute pulmonary embolus: In pt with malignancy NOS. Negative LE dopplers.  - Echocardiogram pending, though no hemodynamic evidence of right heart strain.  - Requires anticoagulation for current clot and ongoing risk factor for unprovoked VTE. Continue short-acting IV heparin in case of need for further procedures per GI. Recommendation post EGD is to restart in AM.   Giant gastric ulcer: Found on EGD 09/20/2018, abnormality noted on CT with hepatic metastases from unknown origin. CA 19-9 (67,479) and CEA (1,319.2) markedly elevated. AFP (2.9) and PSA (0.54) wnl.  - Per GI. Once tissue Dx, can involve oncology - If pathology unhelpful, would need IR consulted for biopsy and this would require cessation of anticoagulation.   Anemia of chronic disease: Suspected, due to malignancy. Normocytic.  - Check anemia panel in AM - Monitor CBC given presence of ulceration and need for anticoagulation  History of prostate CA: No LUTS and  negative PSA (0.54)  HTN:  - Continue norvasc, losartan.   DVT prophylaxis: Heparin gtt  Code Status: Full Family Communication: None at bedside Disposition Plan: Home once cleared by GI.  Consultants:   Gastroenterology, Dr. Carlean Purl  Procedures:   EGD 09/20/2018: Findings:      One non-bleeding giant cratered gastric ulcer with no stigmata of       bleeding was found on the posterior wall of the stomach. . Biopsies were       taken with a cold forceps for histology. Verification of patient       identification for the specimen was done. Estimated blood loss was       minimal.      The exam was otherwise without abnormality. Impression:               - Non-bleeding giant gastric ulcer with no stigmata                            of bleeding. Biopsied. Looks like cancer.                           - The examination was otherwise normal. Recommendation:           - Return patient to hospital ward for ongoing care.                           - Restart heparin in AM  Tricky situation                           At increased risk of bleeding but needs                            anticoagulation due to PE                           - Await pathology results.  Antimicrobials:  None   Subjective: Has been NPO but not hungry. Having abdominal pain off and on. +Weight loss subacutely. Denies gross GI bleeding. No current dyspnea or chest pain.  Objective: Vitals:   09/20/18 0016 09/20/18 0609 09/20/18 1406 09/20/18 1543  BP: 114/75 122/80 (!) 153/76 (!) 169/89  Pulse: (!) 56 (!) 59 (!) 58   Resp: 20  (!) 23 18  Temp: 98.4 F (36.9 C) 98.6 F (37 C) 98.8 F (37.1 C) 98.8 F (37.1 C)  TempSrc: Oral Oral Oral Oral  SpO2: 100% 100% 98% 100%  Weight:  64.4 kg    Height:        Intake/Output Summary (Last 24 hours) at 09/20/2018 1558 Last data filed at 09/20/2018 1547 Gross per 24 hour  Intake 575.42 ml  Output 800 ml  Net -224.58 ml   Filed Weights    09/19/18 1839 09/20/18 0609  Weight: 65 kg 64.4 kg    Gen: Pleasant, calm male in no distress  Pulm: Non-labored breathing room air. Clear to auscultation bilaterally.  CV: Regular rate and rhythm. No murmur, rub, or gallop. No JVD, no pedal edema. GI: Abdomen soft, non-tender, non-distended, with normoactive bowel sounds. No organomegaly or masses felt. Ext: Warm, no deformities Skin: No rashes, lesions or ulcers Neuro: Alert and oriented. No focal neurological deficits. Psych: Judgement and insight appear normal. Mood & affect appropriate.   Data Reviewed: I have personally reviewed following labs and imaging studies  CBC: Recent Labs  Lab 09/19/18 1323 09/20/18 0228  WBC 5.2 4.8  HGB 11.0* 10.2*  HCT 35.2* 32.5*  MCV 93.4 90.0  PLT 322 500   Basic Metabolic Panel: Recent Labs  Lab 09/19/18 1323 09/20/18 0228  NA 134* 136  K 3.9 3.8  CL 99 101  CO2 26 26  GLUCOSE 82 84  BUN 12 9  CREATININE 1.01 0.96  CALCIUM 8.9 8.6*   GFR: Estimated Creatinine Clearance: 52.5 mL/min (by C-G formula based on SCr of 0.96 mg/dL). Liver Function Tests: No results for input(s): AST, ALT, ALKPHOS, BILITOT, PROT, ALBUMIN in the last 168 hours. No results for input(s): LIPASE, AMYLASE in the last 168 hours. No results for input(s): AMMONIA in the last 168 hours. Coagulation Profile: Recent Labs  Lab 09/19/18 1558  INR 1.33   Cardiac Enzymes: No results for input(s): CKTOTAL, CKMB, CKMBINDEX, TROPONINI in the last 168 hours. BNP (last 3 results) No results for input(s): PROBNP in the last 8760 hours. HbA1C: No results for input(s): HGBA1C in the last 72 hours. CBG: No results for input(s): GLUCAP in the last 168 hours. Lipid Profile: No results for input(s): CHOL, HDL, LDLCALC, TRIG, CHOLHDL, LDLDIRECT in the last 72 hours. Thyroid Function Tests: No results for input(s): TSH, T4TOTAL, FREET4, T3FREE, THYROIDAB in the last 72 hours. Anemia Panel: No results for input(s):  VITAMINB12, FOLATE, FERRITIN, TIBC, IRON, RETICCTPCT in the last 72 hours. Urine analysis:  Component Value Date/Time   COLORURINE YELLOW 09/07/2018 [place] 09/07/2018 1555   LABSPEC 1.021 09/07/2018 1555   PHURINE 6.0 09/07/2018 1555   GLUCOSEU NEGATIVE 09/07/2018 1555   GLUCOSEU NEGATIVE 07/20/2017 1056   HGBUR NEGATIVE 09/07/2018 [place] 09/07/2018 [place] 09/07/2018 1555   PROTEINUR 100 (A) 09/07/2018 1555   UROBILINOGEN 0.2 07/20/2017 1056   NITRITE NEGATIVE 09/07/2018 [place] 09/07/2018 1555   No results found for this or any previous visit (from the past 240 hour(s)).    Radiology Studies: Ct Chest W Contrast  Result Date: 09/19/2018 CLINICAL DATA:  Abnormal CT abdomen demonstrating hepatic metastases EXAM: CT CHEST WITH CONTRAST TECHNIQUE: Multidetector CT imaging of the chest was performed during intravenous contrast administration. Sagittal and coronal MPR images reconstructed from axial data set. CONTRAST:  59mL ISOVUE-300 IOPAMIDOL (ISOVUE-300) INJECTION 61% IV COMPARISON:  CT abdomen and pelvis 09/07/2018 FINDINGS: Cardiovascular: Minimal atherosclerotic calcification aorta. Aorta normal caliber without aneurysm or dissection. Filling defects identified in RIGHT lower lobe pulmonary arteries consistent with pulmonary emboli. Remaining pulmonary arteries patent. Small pericardial effusion. Mediastinum/Nodes: Calcified mediastinal and RIGHT hilar lymph nodes. 2.5 x 1.8 cm diameter LEFT thyroid mass, low-attenuation. Base of cervical region otherwise unremarkable. Enlarged subcarinal lymph node 14 mm short axis image 75. No definite esophageal abnormalities. Lungs/Pleura: Minimal dependent atelectasis posterior RIGHT lower lobe. Questionable areas of scarring at lateral RIGHT middle lobe and at medial LEFT lower lobe. Calcified granulomata RIGHT upper lobe image 42 and LEFT upper lobe image 67. Noncalcified  4 mm RIGHT upper lobe nodule image 74. No acute infiltrate, pleural effusion or pneumothorax. Upper Abdomen: Numerous poorly defined hepatic masses compatible with widespread metastatic disease. Enlarged gastrohepatic ligament lymph node 15 mm short axis image 138. Questionable wall thickening of the proximal stomach versus artifact related underdistention; stomach had a similar appearance on the previous exam favor wall thickening, cannot exclude neoplasm. Probable enlarged periportal lymph nodes. Musculoskeletal: No osseous metastatic lesions. IMPRESSION: Hepatic metastatic disease. Enlarged gastrohepatic ligament, periportal, and subcarinal lymph nodes/adenopathy. Gastric wall thickening suspicious for gastric neoplasm extending to the gastroesophageal junction, recommend endoscopy. Old granulomatous disease within chest. Filling defects in RIGHT lower lobe pulmonary arteries consistent with pulmonary embolism. Small pericardial effusion. Low attenuation 2.4 cm diameter mass LEFT thyroid lobe, question cystic versus solid; follow-up thyroid sonography recommended. Aortic Atherosclerosis (ICD10-I70.0). Critical Value/emergent results were called by telephone at the time of interpretation on 09/19/2018 at 9:57 am to Tye Savoy NP, who verbally acknowledged these results. Electronically Signed   By: Lavonia Dana M.D.   On: 09/19/2018 10:00   Vas [place] Lower Extremity Venous (dvt)  Result Date: 09/20/2018  Lower Venous Study Indications: Pulmonary embolism.  Performing Technologist: Oliver Hum RVT  Examination Guidelines: A complete evaluation includes B-mode imaging, spectral Doppler, color Doppler, and power Doppler as needed of all accessible portions of each vessel. Bilateral testing is considered an integral part of a complete examination. Limited examinations for reoccurring indications may be performed as noted.  Right Venous Findings: +---------+---------------+---------+-----------+----------+-------+           CompressibilityPhasicitySpontaneityPropertiesSummary +---------+---------------+---------+-----------+----------+-------+ CFV      Full           Yes      Yes                          +---------+---------------+---------+-----------+----------+-------+ SFJ      Full                                                 +---------+---------------+---------+-----------+----------+-------+  FV Prox  Full                                                 +---------+---------------+---------+-----------+----------+-------+ FV Mid   Full                                                 +---------+---------------+---------+-----------+----------+-------+ FV DistalFull                                                 +---------+---------------+---------+-----------+----------+-------+ PFV      Full                                                 +---------+---------------+---------+-----------+----------+-------+ POP      Full           Yes      Yes                          +---------+---------------+---------+-----------+----------+-------+ PTV      Full                                                 +---------+---------------+---------+-----------+----------+-------+ PERO     Full                                                 +---------+---------------+---------+-----------+----------+-------+  Left Venous Findings: +---------+---------------+---------+-----------+----------+-------+          CompressibilityPhasicitySpontaneityPropertiesSummary +---------+---------------+---------+-----------+----------+-------+ CFV      Full           Yes      Yes                          +---------+---------------+---------+-----------+----------+-------+ SFJ      Full                                                 +---------+---------------+---------+-----------+----------+-------+ FV Prox  Full                                                  +---------+---------------+---------+-----------+----------+-------+ FV Mid   Full                                                 +---------+---------------+---------+-----------+----------+-------+  FV DistalFull                                                 +---------+---------------+---------+-----------+----------+-------+ PFV      Full                                                 +---------+---------------+---------+-----------+----------+-------+ POP      Full           Yes      Yes                          +---------+---------------+---------+-----------+----------+-------+ PTV      Full                                                 +---------+---------------+---------+-----------+----------+-------+ PERO     Full                                                 +---------+---------------+---------+-----------+----------+-------+    Summary: Right: There is no evidence of deep vein thrombosis in the lower extremity. No cystic structure found in the popliteal fossa. Left: There is no evidence of deep vein thrombosis in the lower extremity. No cystic structure found in the popliteal fossa.  *See table(s) above for measurements and observations. Electronically signed by Monica Martinez MD on 09/20/2018 at 12:29:36 PM.    Final     Scheduled Meds: . [MAR Hold] amLODipine  5 mg Oral Daily  . [MAR Hold] benazepril  40 mg Oral Daily  . [MAR Hold] latanoprost  1 drop Both Eyes QHS   Continuous Infusions:   LOS: 1 day   Time spent: 25 minutes.  Patrecia Pour, MD [facility] [email or web address] Password [facility] 09/20/2018, 3:58 PM

## 2018-09-20 NOTE — Care Management Obs Status (Signed)
[place] NOTIFICATION   Patient Details  Name: MORSE BRUEGGEMANN MRN: [id number] Date of Birth: Jun 21, 1935   Medicare Observation Status Notification Given:  Yes    Royston Bake, RN 09/20/2018, 4:47 PM

## 2018-09-20 NOTE — Progress Notes (Signed)
ANTICOAGULATION CONSULT NOTE - Follow Up Consult  Pharmacy Consult for heparin Indication: pulmonary embolus  Labs: Recent Labs    09/19/18 1323 09/19/18 1558 09/20/18 0228  HGB 11.0*  --  10.2*  HCT 35.2*  --  32.5*  PLT 322  --  271  LABPROT  --  16.4*  --   INR  --  1.33  --   HEPARINUNFRC  --   --  0.16*  CREATININE 1.01  --   --     Assessment: 83yo male subtherapeutic on heparin with initial; dosing for PE; no gtt issues or signs of bleeding per RN.  Goal of Therapy:  Heparin level 0.3-0.7 units/ml   Plan:  Will rebolus with heparin 2000 units and increase heparin gtt by 3 units/kg/hr to 1300 units/hr and check level in 8 hours.    Wynona Neat, PharmD, BCPS  09/20/2018,2:57 AM

## 2018-09-20 NOTE — Op Note (Addendum)
[facility] Patient Name: Peter Wood Procedure Date : 09/20/2018 MRN: [id number] Attending MD: Gatha Mayer , MD Date of Birth: 02/14/1935 CSN: [id number] Age: 84 Admit Type: Inpatient Procedure:                Upper GI endoscopy Indications:              Abnormal CT of the GI tract Providers:                Gatha Mayer, MD, Angus Seller, Charolette Child,                            Technician, Raphael Gibney, CRNA Referring MD:              Medicines:                Propofol per Anesthesia, Monitored Anesthesia Care Complications:            No immediate complications. Estimated Blood Loss:     Estimated blood loss was minimal. Procedure:                Pre-Anesthesia Assessment:                           - Prior to the procedure, a History and Physical                            was performed, and patient medications and                            allergies were reviewed. The patient's tolerance of                            previous anesthesia was also reviewed. The risks                            and benefits of the procedure and the sedation                            options and risks were discussed with the patient.                            All questions were answered, and informed consent                            was obtained. Prior Anticoagulants: The patient                            last took heparin on the day of the procedure. ASA                            Grade Assessment: IV - A patient with severe                            systemic disease that is a constant threat to life.  After reviewing the risks and benefits, the patient                            was deemed in satisfactory condition to undergo the                            procedure.                           After obtaining informed consent, the endoscope was                            passed under direct vision. Throughout the     procedure, the patient's blood pressure, pulse, and                            oxygen saturations were monitored continuously. The                            [id number]-[id number] ([id number]) Olympus gastroscope was                            introduced through the mouth, and advanced to the                            second part of duodenum. The upper GI endoscopy was                            accomplished without difficulty. The patient                            tolerated the procedure well. Scope In: Scope Out: Findings:      One non-bleeding giant cratered gastric ulcer with no stigmata of       bleeding was found on the posterior wall of the stomach. . Biopsies were       taken with a cold forceps for histology. Verification of patient       identification for the specimen was done. Estimated blood loss was       minimal.      The exam was otherwise without abnormality. Impression:               - Non-bleeding giant gastric ulcer with no stigmata                            of bleeding. Biopsied. Looks like cancer.                           - The examination was otherwise normal. Recommendation:           - Return patient to hospital ward for ongoing care.                           - Restart heparin in AM  Tricky situation                           At increased risk of bleeding but needs                            anticoagulation due to PE                           - Await pathology results. Procedure Code(s):        --- Professional ---                           [phone], Esophagogastroduodenoscopy, flexible,                            transoral; with biopsy, single or multiple Diagnosis Code(s):        --- Professional ---                           K25.9, Gastric ulcer, unspecified as acute or                            chronic, without hemorrhage or perforation                           R93.3, Abnormal findings on diagnostic imaging of                             other parts of digestive tract CPT copyright 2018 [employer]. All rights reserved. The codes documented in this report are preliminary and upon coder review may  be revised to meet current compliance requirements. Gatha Mayer, MD 09/20/2018 3:53:23 PM This report has been signed electronically. Number of Addenda: 0

## 2018-09-20 NOTE — Anesthesia Preprocedure Evaluation (Signed)
Anesthesia Evaluation  Patient identified by MRN, date of birth, ID band Patient awake    Reviewed: Allergy & Precautions, NPO status , Patient's Chart, lab work & pertinent test results  History of Anesthesia Complications Negative for: history of anesthetic complications  Airway Mallampati: II  TM Distance: >3 FB Neck ROM: Full    Dental  (+) Partial Upper   Pulmonary former smoker, PE   Pulmonary exam normal        Cardiovascular hypertension, + Peripheral Vascular Disease and + DVT  Normal cardiovascular exam     Neuro/Psych negative neurological ROS  negative psych ROS   GI/Hepatic negative GI ROS, Multiple liver mets   Endo/Other  negative endocrine ROS  Renal/GU negative Renal ROS  negative genitourinary   Musculoskeletal negative musculoskeletal ROS (+)   Abdominal   Peds  Hematology negative hematology ROS (+)   Anesthesia Other Findings 84 yo M with PE found incidentally during w/u for metastatic cancer, believed to be possibly gastric primary. Here for EGD.  Reproductive/Obstetrics                            Anesthesia Physical Anesthesia Plan  ASA: III  Anesthesia Plan: MAC   Post-op Pain Management:    Induction:   PONV Risk Score and Plan: 1 and Propofol infusion and Treatment may vary due to age or medical condition  Airway Management Planned: Nasal Cannula and Simple Face Mask  Additional Equipment: None  Intra-op Plan:   Post-operative Plan:   Informed Consent: I have reviewed the patients History and Physical, chart, labs and discussed the procedure including the risks, benefits and alternatives for the proposed anesthesia with the patient or authorized representative who has indicated his/her understanding and acceptance.     Plan Discussed with:   Anesthesia Plan Comments:        Anesthesia Quick Evaluation

## 2018-09-21 DIAGNOSIS — I2699 Other pulmonary embolism without acute cor pulmonale: Secondary | ICD-10-CM | POA: Diagnosis not present

## 2018-09-21 LAB — IRON AND TIBC
Iron: 38 ug/dL — ABNORMAL LOW (ref 45–182)
Saturation Ratios: 15 % — ABNORMAL LOW (ref 17.9–39.5)
TIBC: 251 ug/dL (ref 250–450)
UIBC: 213 ug/dL

## 2018-09-21 LAB — RETICULOCYTES
IMMATURE RETIC FRACT: 13.4 % (ref 2.3–15.9)
RBC.: 3.65 MIL/uL — ABNORMAL LOW (ref 4.22–5.81)
Retic Count, Absolute: 58.8 10*3/uL (ref 19.0–186.0)
Retic Ct Pct: 1.6 % (ref 0.4–3.1)

## 2018-09-21 LAB — CBC
HCT: 33.2 % — ABNORMAL LOW (ref 39.0–52.0)
Hemoglobin: 10.4 g/dL — ABNORMAL LOW (ref 13.0–17.0)
MCH: 28.5 pg (ref 26.0–34.0)
MCHC: 31.3 g/dL (ref 30.0–36.0)
MCV: 91 fL (ref 80.0–100.0)
NRBC: 0 % (ref 0.0–0.2)
PLATELETS: 269 10*3/uL (ref 150–400)
RBC: 3.65 MIL/uL — AB (ref 4.22–5.81)
RDW: 13.1 % (ref 11.5–15.5)
WBC: 4.9 10*3/uL (ref 4.0–10.5)

## 2018-09-21 LAB — VITAMIN B12: Vitamin B-12: 1511 pg/mL — ABNORMAL HIGH (ref 180–914)

## 2018-09-21 LAB — FOLATE: Folate: 15.3 ng/mL (ref >5.9)

## 2018-09-21 LAB — HEPARIN LEVEL (UNFRACTIONATED): Heparin Unfractionated: 0.17 IU/mL — ABNORMAL LOW (ref 0.30–0.70)

## 2018-09-21 LAB — FERRITIN: Ferritin: 138 ng/mL (ref 24–336)

## 2018-09-21 MED ORDER — SODIUM CHLORIDE 0.9 % IV BOLUS
500.0000 mL | Freq: Once | INTRAVENOUS | Status: AC
  Administered 2018-09-21: 500 mL via INTRAVENOUS

## 2018-09-21 NOTE — Progress Notes (Signed)
PROGRESS NOTE  Peter Wood  ZOX:[id number] DOB: 05-31-1935 DOA: 09/19/2018 PCP: Cassandria Anger, MD  Outpatient Specialists: GI, Dr. Carlean Purl. Brief Narrative: Peter Wood is an 84 y.o. male with a history of prostate CA s/p XRT 2015 and HTN who presented to the ED due to incidental finding of PO on CT chest. He's been undergoing work up for multiple hepatic lesions found on CT 09/07/2018 for evaluation of abdominal pain and 25lbs weight loss. After following up with GI 1/8 he had a staging CT chest which revealed a largely asymptomatic pulmonary embolism. He was told to present to the ED and was subsequently admitted on heparin infusion, made NPO and had EGD performed 1/10 showing a giant non-bleeding gastric ulcer which was biopsied.   Assessment & Plan: Principal Problem:   Pulmonary emboli (HCC) Active Problems:   Essential hypertension   Weight loss   Liver metastases (HCC)   Abdominal neoplasm without bowel obstruction   Nausea   Abdominal pain   Acute pulmonary embolus (HCC)  Acute pulmonary embolus: In pt with malignancy NOS. Negative LE dopplers. Echo 1/10 showed mildly dilated RV with normal systolic function, mild pulmonary HTN, LVEF 55-60%, no WMA, no mention of diastolic dysfunction. - Requires anticoagulation for current clot and ongoing risk factor for unprovoked VTE.  - Continue short-acting IV heparin (restarted this AM, subtherapeutic without bolus, appreciate pharmacy assistance dosing) in case of need for further procedures per GI.  Giant gastric ulcer: Found on EGD 09/20/2018, abnormality noted on CT with hepatic metastases from unknown origin. CA 19-9 (67,479) and CEA (1,319.2) markedly elevated. AFP (2.9) and PSA (0.54) wnl.  - Per GI. Pathology pending. Once tissue Dx, can involve oncology - If pathology unhelpful, would need IR consulted for biopsy and this would require cessation of anticoagulation.   Anemia of chronic disease: Suspected, due to  malignancy. Normocytic with normal ferritin, iron 38, TIBC 251 (LLN 250) and 15% tsat.  - Continue daily CBC monitoring with anticoagulation given bleeding risk.   History of prostate CA: No LUTS and negative PSA (0.54)  HTN:  - Continue norvasc, losartan.   DVT prophylaxis: Heparin gtt  Code Status: Full Family Communication: None at bedside Disposition Plan: Home once cleared by GI.  Consultants:   Gastroenterology, Dr. Carlean Purl  Procedures:   EGD 09/20/2018: Findings:      One non-bleeding giant cratered gastric ulcer with no stigmata of       bleeding was found on the posterior wall of the stomach. . Biopsies were       taken with a cold forceps for histology. Verification of patient       identification for the specimen was done. Estimated blood loss was       minimal.      The exam was otherwise without abnormality. Impression:               - Non-bleeding giant gastric ulcer with no stigmata                            of bleeding. Biopsied. Looks like cancer.                           - The examination was otherwise normal. Recommendation:           - Return patient to hospital ward for ongoing care.                           -  Restart heparin in AM                           Tricky situation                           At increased risk of bleeding but needs                            anticoagulation due to PE                           - Await pathology results.  Antimicrobials:  None   Subjective: No new issues. Denies melena, hematochezia, abdominal pain. Given zofran for nausea and seemed to have helped po intake. No dyspnea or chest pain.   Objective: Vitals:   09/20/18 2351 09/21/18 0547 09/21/18 0946 09/21/18 1141  BP: 119/73 123/84 115/70 121/80  Pulse: (!) 55 (!) 59 66 62  Resp: 18 18 18 20   Temp: 98.2 F (36.8 C) 98 F (36.7 C)  (!) 97.4 F (36.3 C)  TempSrc: Oral Oral  Oral  SpO2: 98% 98%  100%  Weight:  63.3 kg    Height:        Intake/Output  Summary (Last 24 hours) at 09/21/2018 1839 Last data filed at 09/21/2018 1300 Gross per 24 hour  Intake 720 ml  Output 700 ml  Net 20 ml   Filed Weights   09/19/18 1839 09/20/18 0609 09/21/18 0547  Weight: 65 kg 64.4 kg 63.3 kg   Gen: Pleasant, well-appearing 84yo male in no distress Pulm: Nonlabored breathing room air. Clear. CV: Regular rate and rhythm. No murmur, rub, or gallop. No JVD, no dependent edema. GI: Abdomen soft, non-tender, non-distended, with normoactive bowel sounds.  Ext: Warm, no deformities Skin: No rashes, lesions or ulcers on visualized skin. Neuro: Alert and oriented. No focal neurological deficits. Psych: Judgement and insight appear fair. Mood euthymic & affect congruent. Behavior is appropriate.    Data Reviewed: I have personally reviewed following labs and imaging studies  CBC: Recent Labs  Lab 09/19/18 1323 09/20/18 0228 09/21/18 0346  WBC 5.2 4.8 4.9  HGB 11.0* 10.2* 10.4*  HCT 35.2* 32.5* 33.2*  MCV 93.4 90.0 91.0  PLT 322 271 517   Basic Metabolic Panel: Recent Labs  Lab 09/19/18 1323 09/20/18 0228  NA 134* 136  K 3.9 3.8  CL 99 101  CO2 26 26  GLUCOSE 82 84  BUN 12 9  CREATININE 1.01 0.96  CALCIUM 8.9 8.6*   GFR: Estimated Creatinine Clearance: 52.5 mL/min (by C-G formula based on SCr of 0.96 mg/dL). Liver Function Tests: No results for input(s): AST, ALT, ALKPHOS, BILITOT, PROT, ALBUMIN in the last 168 hours. No results for input(s): LIPASE, AMYLASE in the last 168 hours. No results for input(s): AMMONIA in the last 168 hours. Coagulation Profile: Recent Labs  Lab 09/19/18 1558  INR 1.33   Cardiac Enzymes: No results for input(s): CKTOTAL, CKMB, CKMBINDEX, TROPONINI in the last 168 hours. BNP (last 3 results) No results for input(s): PROBNP in the last 8760 hours. HbA1C: No results for input(s): HGBA1C in the last 72 hours. CBG: No results for input(s): GLUCAP in the last 168 hours. Lipid Profile: No results for  input(s): CHOL, HDL, LDLCALC, TRIG, CHOLHDL, LDLDIRECT in the last 72 hours. Thyroid Function Tests: No  results for input(s): TSH, T4TOTAL, FREET4, T3FREE, THYROIDAB in the last 72 hours. Anemia Panel: Recent Labs    09/21/18 0346  VITAMINB12 1,511*  FOLATE 15.3  FERRITIN 138  TIBC 251  IRON 38*  RETICCTPCT 1.6   Urine analysis:    Component Value Date/Time   COLORURINE YELLOW 09/07/2018 [place] 09/07/2018 1555   LABSPEC 1.021 09/07/2018 1555   PHURINE 6.0 09/07/2018 1555   GLUCOSEU NEGATIVE 09/07/2018 1555   GLUCOSEU NEGATIVE 07/20/2017 1056   HGBUR NEGATIVE 09/07/2018 1555   BILIRUBINUR NEGATIVE 09/07/2018 [place] 09/07/2018 1555   PROTEINUR 100 (A) 09/07/2018 1555   UROBILINOGEN 0.2 07/20/2017 1056   NITRITE NEGATIVE 09/07/2018 1555   LEUKOCYTESUR NEGATIVE 09/07/2018 1555   No results found for this or any previous visit (from the past 240 hour(s)).    Radiology Studies: Vas [place] Lower Extremity Venous (dvt)  Result Date: 09/20/2018  Lower Venous Study Indications: Pulmonary embolism.  Performing Technologist: Oliver Hum RVT  Examination Guidelines: A complete evaluation includes B-mode imaging, spectral Doppler, color Doppler, and power Doppler as needed of all accessible portions of each vessel. Bilateral testing is considered an integral part of a complete examination. Limited examinations for reoccurring indications may be performed as noted.  Right Venous Findings: +---------+---------------+---------+-----------+----------+-------+          CompressibilityPhasicitySpontaneityPropertiesSummary +---------+---------------+---------+-----------+----------+-------+ CFV      Full           Yes      Yes                          +---------+---------------+---------+-----------+----------+-------+ SFJ      Full                                                  +---------+---------------+---------+-----------+----------+-------+ FV Prox  Full                                                 +---------+---------------+---------+-----------+----------+-------+ FV Mid   Full                                                 +---------+---------------+---------+-----------+----------+-------+ FV DistalFull                                                 +---------+---------------+---------+-----------+----------+-------+ PFV      Full                                                 +---------+---------------+---------+-----------+----------+-------+ POP      Full           Yes      Yes                          +---------+---------------+---------+-----------+----------+-------+  PTV      Full                                                 +---------+---------------+---------+-----------+----------+-------+ PERO     Full                                                 +---------+---------------+---------+-----------+----------+-------+  Left Venous Findings: +---------+---------------+---------+-----------+----------+-------+          CompressibilityPhasicitySpontaneityPropertiesSummary +---------+---------------+---------+-----------+----------+-------+ CFV      Full           Yes      Yes                          +---------+---------------+---------+-----------+----------+-------+ SFJ      Full                                                 +---------+---------------+---------+-----------+----------+-------+ FV Prox  Full                                                 +---------+---------------+---------+-----------+----------+-------+ FV Mid   Full                                                 +---------+---------------+---------+-----------+----------+-------+ FV DistalFull                                                  +---------+---------------+---------+-----------+----------+-------+ PFV      Full                                                 +---------+---------------+---------+-----------+----------+-------+ POP      Full           Yes      Yes                          +---------+---------------+---------+-----------+----------+-------+ PTV      Full                                                 +---------+---------------+---------+-----------+----------+-------+ PERO     Full                                                 +---------+---------------+---------+-----------+----------+-------+  Summary: Right: There is no evidence of deep vein thrombosis in the lower extremity. No cystic structure found in the popliteal fossa. Left: There is no evidence of deep vein thrombosis in the lower extremity. No cystic structure found in the popliteal fossa.  *See table(s) above for measurements and observations. Electronically signed by Monica Martinez MD on 09/20/2018 at 12:29:36 PM.    Final     Scheduled Meds: . amLODipine  5 mg Oral Daily  . benazepril  40 mg Oral Daily  . latanoprost  1 drop Both Eyes QHS  . pantoprazole  40 mg Oral BID AC   Continuous Infusions: . heparin 1,450 Units/hr (09/21/18 1751)     LOS: 1 day   Time spent: 25 minutes.  Patrecia Pour, MD [facility] [email or web address] Password [facility] 09/21/2018, 6:39 PM

## 2018-09-21 NOTE — Progress Notes (Signed)
ANTICOAGULATION CONSULT NOTE - Follow Up Consult  Pharmacy Consult for heparin Indication: pulmonary embolus  Labs: Recent Labs    09/19/18 1323 09/19/18 1558 09/20/18 0228 09/21/18 0346 09/21/18 1601  HGB 11.0*  --  10.2* 10.4*  --   HCT 35.2*  --  32.5* 33.2*  --   PLT 322  --  271 269  --   LABPROT  --  16.4*  --   --   --   INR  --  1.33  --   --   --   HEPARINUNFRC  --   --  0.16*  --  0.17*  CREATININE 1.01  --  0.96  --   --     Assessment: 83yo male subtherapeutic on heparin with initial; dosing for PE.   S/p upper endoscopy 1/10, found giant cratered nonbleeding gastric ulcer concerning for cancer, biopsy taken. Orders to resume heparin 1/11 AM, given very high bleeding risk will not bolus.  Heparin level low at 0.17 on restart. CBC stable. No bleeding or issues with infusion per discussion with RN.  Goal of Therapy:  Heparin level 0.3-0.7 units/ml   Plan:  Increase heparin to 1450 units/hr - conservative increase with high bleed risk 8 hour heparin level Monitor daily heparin level and CBC, s/sx bleeding  Elicia Lamp, PharmD, BCPS Clinical Pharmacist Please check AMION for all [place] contact numbers 09/21/2018 5:21 PM

## 2018-09-22 ENCOUNTER — Encounter (HOSPITAL_COMMUNITY): Payer: Self-pay | Admitting: Internal Medicine

## 2018-09-22 DIAGNOSIS — I2699 Other pulmonary embolism without acute cor pulmonale: Secondary | ICD-10-CM | POA: Diagnosis not present

## 2018-09-22 DIAGNOSIS — C162 Malignant neoplasm of body of stomach: Secondary | ICD-10-CM

## 2018-09-22 LAB — CBC
HCT: 33.5 % — ABNORMAL LOW (ref 39.0–52.0)
Hemoglobin: 10.9 g/dL — ABNORMAL LOW (ref 13.0–17.0)
MCH: 29.1 pg (ref 26.0–34.0)
MCHC: 32.5 g/dL (ref 30.0–36.0)
MCV: 89.6 fL (ref 80.0–100.0)
Platelets: 265 10*3/uL (ref 150–400)
RBC: 3.74 MIL/uL — ABNORMAL LOW (ref 4.22–5.81)
RDW: 13.1 % (ref 11.5–15.5)
WBC: 7.9 10*3/uL (ref 4.0–10.5)
nRBC: 0 % (ref 0.0–0.2)

## 2018-09-22 LAB — URINALYSIS, ROUTINE W REFLEX MICROSCOPIC
Bilirubin Urine: NEGATIVE
Glucose, UA: NEGATIVE mg/dL
Hgb urine dipstick: NEGATIVE
Ketones, ur: 5 mg/dL — AB
Leukocytes, UA: NEGATIVE
Nitrite: NEGATIVE
Protein, ur: NEGATIVE mg/dL
SPECIFIC GRAVITY, URINE: 1.014 (ref 1.005–1.030)
pH: 6 (ref 5.0–8.0)

## 2018-09-22 LAB — HEPARIN LEVEL (UNFRACTIONATED)
Heparin Unfractionated: 0.53 IU/mL (ref 0.30–0.70)
Heparin Unfractionated: 0.41 IU/mL (ref 0.30–0.70)

## 2018-09-22 NOTE — Progress Notes (Signed)
   Nothing new a this point Waiting on gastric bxs - hopefully out tomorrow We will f/u  Think he needs to stay - use heparin IV until we have a dx.  Gatha Mayer, MD, [facility] 09/22/2018 10:53 AM Pager [phone]

## 2018-09-22 NOTE — Progress Notes (Signed)
ANTICOAGULATION CONSULT NOTE - Follow Up Consult  Pharmacy Consult for heparin Indication: pulmonary embolus  Labs: Recent Labs    09/19/18 1323 09/19/18 1558 09/20/18 0228 09/21/18 0346 09/21/18 1601 09/22/18 0236  HGB 11.0*  --  10.2* 10.4*  --  10.9*  HCT 35.2*  --  32.5* 33.2*  --  33.5*  PLT 322  --  271 269  --  265  LABPROT  --  16.4*  --   --   --   --   INR  --  1.33  --   --   --   --   HEPARINUNFRC  --   --  0.16*  --  0.17* 0.53  CREATININE 1.01  --  0.96  --   --   --     Assessment/Plan:  84yo male therapeutic on heparin after rate change. Will continue gtt at current rate and confirm stable with additional level.   Wynona Neat, PharmD, BCPS  09/22/2018,3:26 AM

## 2018-09-22 NOTE — Progress Notes (Signed)
Patient remains on heparin drip.  No signs of bleeding noted.  Continues to present with poor appetite.  Denied any nausea this shift.  Ate a few bites of his breakfast, refused lunch and dinner trays.  Patient afebrile this shift. Family at bedside.  Marcille Blanco, RN

## 2018-09-22 NOTE — Progress Notes (Signed)
PROGRESS NOTE  Peter Wood  EXH:[id number] DOB: 10-14-1934 DOA: 09/19/2018 PCP: Cassandria Anger, MD  Outpatient Specialists: GI, Dr. Carlean Purl. Brief Narrative: Peter Wood is an 84 y.o. male with a history of prostate CA s/p XRT 2015 and HTN who presented to the ED due to incidental finding of PO on CT chest. He's been undergoing work up for multiple hepatic lesions found on CT 09/07/2018 for evaluation of abdominal pain and 25lbs weight loss. After following up with GI 1/8 he had a staging CT chest which revealed a largely asymptomatic pulmonary embolism. He was told to present to the ED and was subsequently admitted on heparin infusion, made NPO and had EGD performed 1/10 showing a giant non-bleeding gastric ulcer which was biopsied.   Assessment & Plan: Principal Problem:   Pulmonary emboli (HCC) Active Problems:   Essential hypertension   Weight loss   Liver metastases (HCC)   Abdominal neoplasm without bowel obstruction   Nausea   Abdominal pain   Acute pulmonary embolus (HCC)  Acute pulmonary embolus: In pt with malignancy NOS. Negative LE dopplers. Echo 1/10 showed mildly dilated RV with normal systolic function, mild pulmonary HTN, LVEF 55-60%, no WMA, no mention of diastolic dysfunction. - Requires anticoagulation for current clot and ongoing risk factor for unprovoked VTE.  - Continue IV heparin in case of need for further procedures per GI. No bleeding  Giant gastric ulcer: Found on EGD 09/20/2018, abnormality noted on CT with hepatic metastases from unknown origin. CA 19-9 (67,479) and CEA (1,319.2) markedly elevated. AFP (2.9) and PSA (0.54) wnl.  - Per GI. Pathology pending. Once tissue Dx, can involve oncology - If pathology unhelpful, would need IR consulted for biopsy and this would require cessation of anticoagulation, so remaining on heparin gtt.   Fever: Uncertain etiology. Only associated symptom is similar abdominal discomfort as he's been having. WBC  normal. Urinalysis negative and no urinary symptoms. Leading possibilities are atelectasis complicating PE, VTE, and malignancy.  - Incentive spirometry - Monitor off antibiotics. Discussed with RN to page if re-fevers and will check blood cultures.  Anemia of chronic disease: Suspected, due to malignancy. Normocytic with normal ferritin, iron 38, TIBC 251 (LLN 250) and 15% tsat.  - Continue daily CBC monitoring with anticoagulation given bleeding risk.   History of prostate CA: No LUTS and negative PSA (0.54)  HTN:  - Continue norvasc, losartan.   DVT prophylaxis: Heparin gtt  Code Status: Full Family Communication: None at bedside Disposition Plan: Home once cleared by GI.  Consultants:   Gastroenterology, Dr. Carlean Purl  Procedures:   EGD 09/20/2018: Findings:      One non-bleeding giant cratered gastric ulcer with no stigmata of       bleeding was found on the posterior wall of the stomach. . Biopsies were       taken with a cold forceps for histology. Verification of patient       identification for the specimen was done. Estimated blood loss was       minimal.      The exam was otherwise without abnormality. Impression:               - Non-bleeding giant gastric ulcer with no stigmata                            of bleeding. Biopsied. Looks like cancer.                           -  The examination was otherwise normal. Recommendation:           - Return patient to hospital ward for ongoing care.                           - Restart heparin in AM                           Tricky situation                           At increased risk of bleeding but needs                            anticoagulation due to PE                           - Await pathology results.  Antimicrobials:  None   Subjective: Had some flare of his same intermittent abdominal pain last night with concomitant fever. Did not feel ill. No cough, chest pain, dyspnea, palpitations, N/V/D. Abdominal pain  resolved currently, ate breakfast without issues. No new rashes or wounds.   Objective: Vitals:   09/21/18 2139 09/21/18 2337 09/22/18 0436 09/22/18 0820  BP:   (!) 134/97 115/72  Pulse:   75 60  Resp:   18 18  Temp: (!) 101.7 F (38.7 C) (!) 100.9 F (38.3 C) 99.4 F (37.4 C) 99.6 F (37.6 C)  TempSrc: Oral Oral Oral Oral  SpO2:   99% 94%  Weight:   63.5 kg   Height:        Intake/Output Summary (Last 24 hours) at 09/22/2018 1128 Last data filed at 09/22/2018 0900 Gross per 24 hour  Intake 966.26 ml  Output 1700 ml  Net -733.74 ml   Filed Weights   09/20/18 0609 09/21/18 0547 09/22/18 0436  Weight: 64.4 kg 63.3 kg 63.5 kg   Gen: Well-appearing elderly male in no distress Pulm: Nonlabored breathing room air. Clear. CV: Regular rate and rhythm. No murmur, rub, or gallop. No JVD, no dependent edema. GI: Abdomen soft, non-tender, non-distended, with normoactive bowel sounds.  Ext: Warm, no deformities Skin: No rashes, lesions or ulcers on visualized skin. Neuro: Alert and oriented. No focal neurological deficits. Psych: Judgement and insight appear fair. Mood euthymic & affect congruent. Behavior is appropriate.    Data Reviewed: I have personally reviewed following labs and imaging studies  CBC: Recent Labs  Lab 09/19/18 1323 09/20/18 0228 09/21/18 0346 09/22/18 0236  WBC 5.2 4.8 4.9 7.9  HGB 11.0* 10.2* 10.4* 10.9*  HCT 35.2* 32.5* 33.2* 33.5*  MCV 93.4 90.0 91.0 89.6  PLT 322 271 269 601   Basic Metabolic Panel: Recent Labs  Lab 09/19/18 1323 09/20/18 0228  NA 134* 136  K 3.9 3.8  CL 99 101  CO2 26 26  GLUCOSE 82 84  BUN 12 9  CREATININE 1.01 0.96  CALCIUM 8.9 8.6*   GFR: Estimated Creatinine Clearance: 52.5 mL/min (by C-G formula based on SCr of 0.96 mg/dL).  Coagulation Profile: Recent Labs  Lab 09/19/18 1558  INR 1.33    Recent Labs    09/21/18 0346  VITAMINB12 1,511*  FOLATE 15.3  FERRITIN 138  TIBC 251  IRON 38*  RETICCTPCT 1.6     Urine analysis:    Component  Value Date/Time   COLORURINE YELLOW 09/22/2018 [place] 09/22/2018 0541   LABSPEC 1.014 09/22/2018 0541   PHURINE 6.0 09/22/2018 0541   GLUCOSEU NEGATIVE 09/22/2018 0541   GLUCOSEU NEGATIVE 07/20/2017 1056   HGBUR NEGATIVE 09/22/2018 0541   BILIRUBINUR NEGATIVE 09/22/2018 0541   KETONESUR 5 (A) 09/22/2018 0541   PROTEINUR NEGATIVE 09/22/2018 0541   UROBILINOGEN 0.2 07/20/2017 1056   NITRITE NEGATIVE 09/22/2018 0541   LEUKOCYTESUR NEGATIVE 09/22/2018 0541   No results found for this or any previous visit (from the past 240 hour(s)).    Radiology Studies: Vas [place] Lower Extremity Venous (dvt)  Result Date: 09/20/2018  Lower Venous Study Indications: Pulmonary embolism.  Performing Technologist: Oliver Hum RVT  Examination Guidelines: A complete evaluation includes B-mode imaging, spectral Doppler, color Doppler, and power Doppler as needed of all accessible portions of each vessel. Bilateral testing is considered an integral part of a complete examination. Limited examinations for reoccurring indications may be performed as noted.  Right Venous Findings: +---------+---------------+---------+-----------+----------+-------+          CompressibilityPhasicitySpontaneityPropertiesSummary +---------+---------------+---------+-----------+----------+-------+ CFV      Full           Yes      Yes                          +---------+---------------+---------+-----------+----------+-------+ SFJ      Full                                                 +---------+---------------+---------+-----------+----------+-------+ FV Prox  Full                                                 +---------+---------------+---------+-----------+----------+-------+ FV Mid   Full                                                 +---------+---------------+---------+-----------+----------+-------+ FV DistalFull                                                  +---------+---------------+---------+-----------+----------+-------+ PFV      Full                                                 +---------+---------------+---------+-----------+----------+-------+ POP      Full           Yes      Yes                          +---------+---------------+---------+-----------+----------+-------+ PTV      Full                                                 +---------+---------------+---------+-----------+----------+-------+  PERO     Full                                                 +---------+---------------+---------+-----------+----------+-------+  Left Venous Findings: +---------+---------------+---------+-----------+----------+-------+          CompressibilityPhasicitySpontaneityPropertiesSummary +---------+---------------+---------+-----------+----------+-------+ CFV      Full           Yes      Yes                          +---------+---------------+---------+-----------+----------+-------+ SFJ      Full                                                 +---------+---------------+---------+-----------+----------+-------+ FV Prox  Full                                                 +---------+---------------+---------+-----------+----------+-------+ FV Mid   Full                                                 +---------+---------------+---------+-----------+----------+-------+ FV DistalFull                                                 +---------+---------------+---------+-----------+----------+-------+ PFV      Full                                                 +---------+---------------+---------+-----------+----------+-------+ POP      Full           Yes      Yes                          +---------+---------------+---------+-----------+----------+-------+ PTV      Full                                                  +---------+---------------+---------+-----------+----------+-------+ PERO     Full                                                 +---------+---------------+---------+-----------+----------+-------+    Summary: Right: There is no evidence of deep vein thrombosis in the lower extremity. No cystic structure found in the popliteal fossa. Left: There is no evidence of deep vein thrombosis in the lower extremity. No cystic structure found in the popliteal fossa.  *  See table(s) above for measurements and observations. Electronically signed by Monica Martinez MD on 09/20/2018 at 12:29:36 PM.    Final     Scheduled Meds: . amLODipine  5 mg Oral Daily  . benazepril  40 mg Oral Daily  . latanoprost  1 drop Both Eyes QHS  . pantoprazole  40 mg Oral BID AC   Continuous Infusions: . heparin 1,450 Units/hr (09/22/18 0545)     LOS: 1 day   Time spent: 25 minutes.  Patrecia Pour, MD [facility] [email or web address] Password [facility] 09/22/2018, 11:28 AM

## 2018-09-22 NOTE — Progress Notes (Signed)
ANTICOAGULATION CONSULT NOTE - Follow Up Consult  Pharmacy Consult for heparin Indication: pulmonary embolus  Labs: Recent Labs    09/19/18 1323 09/19/18 1558  09/20/18 0228 09/21/18 0346 09/21/18 1601 09/22/18 0236 09/22/18 1020  HGB 11.0*  --   --  10.2* 10.4*  --  10.9*  --   HCT 35.2*  --   --  32.5* 33.2*  --  33.5*  --   PLT 322  --   --  271 269  --  265  --   LABPROT  --  16.4*  --   --   --   --   --   --   INR  --  1.33  --   --   --   --   --   --   HEPARINUNFRC  --   --    < > 0.16*  --  0.17* 0.53 0.41  CREATININE 1.01  --   --  0.96  --   --   --   --    < > = values in this interval not displayed.    Assessment: 84yo male subtherapeutic on heparin with initial; dosing for PE. S/p upper endoscopy 1/10, found giant cratered nonbleeding gastric ulcer concerning for cancer, biopsy taken. Orders to resume heparin 1/11 AM.  1/12: Heparin level this morning therapeutic at 0.41 on 1450 units/hr. CBC WNL, and no bleeding noted.  Goal of Therapy:  Heparin level 0.3-0.7 units/ml   Plan:  Continue heparin infusion at 1450 units/hr Monitor daily heparin level and CBC, s/sx bleeding  Thank you for involving pharmacy in this patient's care.  Janae Bridgeman, PharmD PGY1 Pharmacy Resident Phone: [phone] 09/22/2018 11:09 AM

## 2018-09-22 NOTE — Plan of Care (Signed)
  Problem: Education: Goal: Knowledge of General Education information will improve Description: Including pain rating scale, medication(s)/side effects and non-pharmacologic comfort measures Outcome: Progressing   Problem: Clinical Measurements: Goal: Respiratory complications will improve Outcome: Progressing   

## 2018-09-23 ENCOUNTER — Telehealth: Payer: Self-pay | Admitting: Oncology

## 2018-09-23 DIAGNOSIS — D6869 Other thrombophilia: Secondary | ICD-10-CM | POA: Diagnosis present

## 2018-09-23 DIAGNOSIS — D4989 Neoplasm of unspecified behavior of other specified sites: Secondary | ICD-10-CM | POA: Diagnosis not present

## 2018-09-23 DIAGNOSIS — Z8546 Personal history of malignant neoplasm of prostate: Secondary | ICD-10-CM | POA: Diagnosis not present

## 2018-09-23 DIAGNOSIS — I2699 Other pulmonary embolism without acute cor pulmonale: Secondary | ICD-10-CM | POA: Diagnosis present

## 2018-09-23 DIAGNOSIS — K259 Gastric ulcer, unspecified as acute or chronic, without hemorrhage or perforation: Secondary | ICD-10-CM

## 2018-09-23 DIAGNOSIS — C787 Secondary malignant neoplasm of liver and intrahepatic bile duct: Secondary | ICD-10-CM

## 2018-09-23 DIAGNOSIS — R109 Unspecified abdominal pain: Secondary | ICD-10-CM | POA: Diagnosis not present

## 2018-09-23 DIAGNOSIS — I272 Pulmonary hypertension, unspecified: Secondary | ICD-10-CM | POA: Diagnosis present

## 2018-09-23 DIAGNOSIS — Z923 Personal history of irradiation: Secondary | ICD-10-CM | POA: Diagnosis not present

## 2018-09-23 DIAGNOSIS — Z6823 Body mass index (BMI) 23.0-23.9, adult: Secondary | ICD-10-CM | POA: Diagnosis not present

## 2018-09-23 DIAGNOSIS — Z87891 Personal history of nicotine dependence: Secondary | ICD-10-CM | POA: Diagnosis not present

## 2018-09-23 DIAGNOSIS — C169 Malignant neoplasm of stomach, unspecified: Secondary | ICD-10-CM | POA: Diagnosis present

## 2018-09-23 DIAGNOSIS — R634 Abnormal weight loss: Secondary | ICD-10-CM

## 2018-09-23 DIAGNOSIS — I10 Essential (primary) hypertension: Secondary | ICD-10-CM

## 2018-09-23 DIAGNOSIS — D638 Anemia in other chronic diseases classified elsewhere: Secondary | ICD-10-CM | POA: Diagnosis present

## 2018-09-23 DIAGNOSIS — R110 Nausea: Secondary | ICD-10-CM

## 2018-09-23 DIAGNOSIS — Z79899 Other long term (current) drug therapy: Secondary | ICD-10-CM | POA: Diagnosis not present

## 2018-09-23 LAB — CBC
HEMATOCRIT: 31.6 % — AB (ref 39.0–52.0)
HEMOGLOBIN: 9.9 g/dL — AB (ref 13.0–17.0)
MCH: 28.2 pg (ref 26.0–34.0)
MCHC: 31.3 g/dL (ref 30.0–36.0)
MCV: 90 fL (ref 80.0–100.0)
Platelets: 242 10*3/uL (ref 150–400)
RBC: 3.51 MIL/uL — ABNORMAL LOW (ref 4.22–5.81)
RDW: 13.3 % (ref 11.5–15.5)
WBC: 8.1 10*3/uL (ref 4.0–10.5)
nRBC: 0 % (ref 0.0–0.2)

## 2018-09-23 LAB — HEPARIN LEVEL (UNFRACTIONATED)
Heparin Unfractionated: 0.3 IU/mL (ref 0.30–0.70)
Heparin Unfractionated: 0.31 IU/mL (ref 0.30–0.70)

## 2018-09-23 NOTE — Telephone Encounter (Signed)
Cld and spoke to the pt's wife to provide an appt for Mr. Peter Wood to see Dr. Jana Hakim on 1/24 at 130pm. Aware to arrive 30 minutes early.

## 2018-09-23 NOTE — Progress Notes (Signed)
ANTICOAGULATION CONSULT NOTE - Follow Up Consult  Pharmacy Consult for Heparin Indication: pulmonary embolus  No Known Allergies  Patient Measurements: Height: 5\' 5"  (165.1 cm) Weight: 140 lb 3.2 oz (63.6 kg)(scale b) IBW/kg (Calculated) : 61.5 Heparin Dosing Weight: 63.6 kg  Vital Signs: Temp: 98.7 F (37.1 C) (01/13 1218) Temp Source: Oral (01/13 1218) BP: 106/69 (01/13 1218) Pulse Rate: 63 (01/13 1218)  Labs: Recent Labs    09/21/18 0346  09/22/18 0236 09/22/18 1020 09/23/18 0426 09/23/18 1750  HGB 10.4*  --  10.9*  --  9.9*  --   HCT 33.2*  --  33.5*  --  31.6*  --   PLT 269  --  265  --  242  --   HEPARINUNFRC  --    < > 0.53 0.41 0.30 0.31   < > = values in this interval not displayed.    Estimated Creatinine Clearance: 52.5 mL/min (by C-G formula based on SCr of 0.96 mg/dL).  Assessment:  84 yr old male continues on IV heparin for PE.  S/p endoscopy 1/10 which showed giant non-bleeding gastric ulcer and concern for malignancy.  Biopsies pending.  Noted if non-diagnostic, will need liver biopsy.  Heparin level is low therapeutic (0.31) on 1500 units/hr. No bleeding reported. On Protonix 40 mg BID.  Goal of Therapy:  Heparin level 0.3-0.7 units/ml Monitor platelets by anticoagulation protocol: Yes   Plan:  Increase heparin drip to 1550 units/hr to try to keep level in target range. Daily heparin level and CBC while on heparin. Follow up biopsy report and plans.   Erin Hearing PharmD., BCPS Clinical Pharmacist 09/23/2018 6:42 PM

## 2018-09-23 NOTE — Progress Notes (Signed)
PROGRESS NOTE  Peter Wood  PZW:[id number] DOB: 09-04-1935 DOA: 09/19/2018 PCP: Cassandria Anger, MD  Outpatient Specialists: GI, Dr. Carlean Purl. Brief Narrative: Peter Wood is an 84 y.o. male with a history of prostate CA s/p XRT 2015 and HTN who presented to the ED due to incidental finding of PO on CT chest. He's been undergoing work up for multiple hepatic lesions found on CT 09/07/2018 for evaluation of abdominal pain and 25lbs weight loss. After following up with GI 1/8 he had a staging CT chest which revealed a largely asymptomatic pulmonary embolism. He was told to present to the ED and was subsequently admitted on heparin infusion, made NPO and had EGD performed 1/10 showing a giant non-bleeding gastric ulcer which was biopsied. Heparin infusion has continued due to high risk of bleeding and active blood clot while awaiting pathology results. In the event that biopsy is unrevealing, IR would be required to perform biopsy from alternative site and he would not be a candidate to hold oral anticoagulation.  Assessment & Plan: Principal Problem:   Pulmonary emboli (HCC) Active Problems:   Essential hypertension   Weight loss   Liver metastases (HCC)   Abdominal neoplasm without bowel obstruction   Nausea   Abdominal pain   Acute pulmonary embolus (HCC)  Acute pulmonary embolus: In pt with malignancy NOS. Negative LE dopplers. Echo 1/10 showed mildly dilated RV with normal systolic function, mild pulmonary HTN, LVEF 55-60%, no WMA, no mention of diastolic dysfunction. - Requires anticoagulation for current clot and ongoing risk factor for unprovoked VTE.  - Continue IV heparin in case of need for further procedures per GI. No bleeding  Giant gastric ulcer: Found on EGD 09/20/2018, abnormality noted on CT with hepatic metastases from unknown origin. CA 19-9 (67,479) and CEA (1,319.2) markedly elevated. AFP (2.9) and PSA (0.54) wnl.  - Per GI. Pathology pending. Once tissue Dx,  can involve oncology - If pathology unhelpful, would need IR consulted for biopsy and this would require cessation of anticoagulation, so remaining on heparin gtt.   Fever: Uncertain etiology. Only associated symptom is similar abdominal discomfort as he's been having. WBC normal. Urinalysis negative and no urinary symptoms. Leading possibilities are atelectasis complicating PE, VTE, and malignancy. Given the recurrent nature without any symptoms, it's possible he's been having fevers for some time related to malignancy.  - Incentive spirometry - Will continue to monitor off antibiotics. Don't believe blood cultures will be helpful in the absence of other infectious signs/symptoms.  Anemia of chronic disease: Suspected, due to malignancy. Normocytic with normal ferritin, iron 38, TIBC 251 (LLN 250) and 15% tsat.  - Continue daily CBC monitoring with anticoagulation given bleeding risk. Down 1g/dl today, will recheck in AM or earlier if bleeding grossly.  History of prostate CA: No LUTS and negative PSA (0.54)  HTN:  - Continue norvasc, losartan.   DVT prophylaxis: Heparin gtt  Code Status: Full Family Communication: Wife at bedside Disposition Plan: Home once cleared by GI.  Consultants:   Gastroenterology, Dr. Carlean Purl  Procedures:   EGD 09/20/2018: Findings:      One non-bleeding giant cratered gastric ulcer with no stigmata of       bleeding was found on the posterior wall of the stomach. . Biopsies were       taken with a cold forceps for histology. Verification of patient       identification for the specimen was done. Estimated blood loss was  minimal.      The exam was otherwise without abnormality. Impression:               - Non-bleeding giant gastric ulcer with no stigmata                            of bleeding. Biopsied. Looks like cancer.                           - The examination was otherwise normal. Recommendation:           - Return patient to hospital ward  for ongoing care.                           - Restart heparin in AM                           Tricky situation                           At increased risk of bleeding but needs                            anticoagulation due to PE                           - Await pathology results.  Antimicrobials:  None   Subjective: No symptoms with fever yesterday evening. Abdominal pain is stable, intermittent, and sometimes associated with nausea. No vomiting. Denies cough, shortness of breath, chest pain, palpitations, diarrhea, rash.  Objective: Vitals:   09/22/18 1937 09/22/18 2200 09/23/18 0530 09/23/18 1218  BP: 122/79  116/73 106/69  Pulse: 63  62 63  Resp: 18  18 18   Temp: (!) 101.4 F (38.6 C) 99.3 F (37.4 C) 99.4 F (37.4 C) 98.7 F (37.1 C)  TempSrc: Oral Oral Oral Oral  SpO2: 100%  100% 100%  Weight:   63.6 kg   Height:        Intake/Output Summary (Last 24 hours) at 09/23/2018 1555 Last data filed at 09/23/2018 1431 Gross per 24 hour  Intake 480 ml  Output 700 ml  Net -220 ml   Filed Weights   09/21/18 0547 09/22/18 0436 09/23/18 0530  Weight: 63.3 kg 63.5 kg 63.6 kg   Gen: 84 y.o. male in no distress Pulm: Nonlabored breathing room air. Clear. CV: Regular rate and rhythm. No murmur, rub, or gallop. No JVD, no dependent edema. GI: Abdomen soft, not significantly tender, non-distended, with normoactive bowel sounds.  Ext: Warm, no deformities Skin: No rashes, lesions or ulcers on visualized skin. Neuro: Alert and oriented. No focal neurological deficits. Psych: Judgement and insight appear fair. Mood euthymic & affect congruent. Behavior is appropriate.    Data Reviewed: I have personally reviewed following labs and imaging studies  CBC: Recent Labs  Lab 09/19/18 1323 09/20/18 0228 09/21/18 0346 09/22/18 0236 09/23/18 0426  WBC 5.2 4.8 4.9 7.9 8.1  HGB 11.0* 10.2* 10.4* 10.9* 9.9*  HCT 35.2* 32.5* 33.2* 33.5* 31.6*  MCV 93.4 90.0 91.0 89.6 90.0  PLT 322  271 269 265 751   Basic Metabolic Panel: Recent Labs  Lab 09/19/18 1323 09/20/18 0228  NA 134* 136  K 3.9  3.8  CL 99 101  CO2 26 26  GLUCOSE 82 84  BUN 12 9  CREATININE 1.01 0.96  CALCIUM 8.9 8.6*   GFR: Estimated Creatinine Clearance: 52.5 mL/min (by C-G formula based on SCr of 0.96 mg/dL).  Coagulation Profile: Recent Labs  Lab 09/19/18 1558  INR 1.33    Recent Labs    09/21/18 0346  VITAMINB12 1,511*  FOLATE 15.3  FERRITIN 138  TIBC 251  IRON 38*  RETICCTPCT 1.6   Urine analysis:    Component Value Date/Time   COLORURINE YELLOW 09/22/2018 [place] 09/22/2018 0541   LABSPEC 1.014 09/22/2018 0541   PHURINE 6.0 09/22/2018 0541   GLUCOSEU NEGATIVE 09/22/2018 0541   GLUCOSEU NEGATIVE 07/20/2017 1056   HGBUR NEGATIVE 09/22/2018 0541   BILIRUBINUR NEGATIVE 09/22/2018 0541   KETONESUR 5 (A) 09/22/2018 0541   PROTEINUR NEGATIVE 09/22/2018 0541   UROBILINOGEN 0.2 07/20/2017 1056   NITRITE NEGATIVE 09/22/2018 0541   LEUKOCYTESUR NEGATIVE 09/22/2018 0541   No results found for this or any previous visit (from the past 240 hour(s)).    Radiology Studies: No results found.  Scheduled Meds: . amLODipine  5 mg Oral Daily  . benazepril  40 mg Oral Daily  . latanoprost  1 drop Both Eyes QHS  . pantoprazole  40 mg Oral BID AC   Continuous Infusions: . heparin 1,500 Units/hr (09/23/18 1130)     LOS: 0 days   Time spent: 25 minutes.  Peter Pour, MD [facility] [email or web address] Password [facility] 09/23/2018, 3:55 PM

## 2018-09-23 NOTE — Progress Notes (Signed)
      Progress Note   Subjective  Patient denies pain, no bleeding symptoms. Ate breakfast without complaints.   Objective   Vital signs in last 24 hours: Temp:  [98 F (36.7 C)-101.4 F (38.6 C)] 99.4 F (37.4 C) (01/13 0530) Pulse Rate:  [62-71] 62 (01/13 0530) Resp:  [18-20] 18 (01/13 0530) BP: (110-122)/(73-79) 116/73 (01/13 0530) SpO2:  [98 %-100 %] 100 % (01/13 0530) Weight:  [63.6 kg] 63.6 kg (01/13 0530) Last BM Date: 09/22/18 General:    AA male in NAD Heart:  Regular rate and rhythm; no murmurs Lungs: Respirations even and unlabored, lungs CTA bilaterally Abdomen:  Soft, nontender and nondistended.  Extremities:  Without edema. Neurologic:  Alert and oriented,  grossly normal neurologically. Psych:  Cooperative. Normal mood and affect.  Intake/Output from previous day: 01/12 0701 - 01/13 0700 In: 120 [P.O.:120] Out: 1400 [Urine:1400] Intake/Output this shift: Total I/O In: 240 [P.O.:240] Out: 200 [Urine:200]  Lab Results: Recent Labs    09/21/18 0346 09/22/18 0236 09/23/18 0426  WBC 4.9 7.9 8.1  HGB 10.4* 10.9* 9.9*  HCT 33.2* 33.5* 31.6*  PLT 269 265 242   BMET No results for input(s): NA, K, CL, CO2, GLUCOSE, BUN, CREATININE, CALCIUM in the last 72 hours. LFT No results for input(s): PROT, ALBUMIN, AST, ALT, ALKPHOS, BILITOT, BILIDIR, IBILI in the last 72 hours. PT/INR No results for input(s): LABPROT, INR in the last 72 hours.  Studies/Results: No results found.     Assessment / Plan:    84 y/o male with multiple liver lesions concerning for metastatic disease, CT recently scan also showed R LL pulmonary embolism.  He also had gastric wall thickening noted on imaging. Admitted for heparin drip and treatment for PE, EGD done while inpatient showed giant gastric ulcer, concern for malignancy. Biopsies taken and remain pending. Hgb 9.9 down from 10.9, he has a mild anemia, but no overt bleeding symptoms. CA 19-9 and CEA markedly elevated.    Awaiting gastric biopsy results, hopefully back later today or tomorrow. If nondiagnostic may need IR guided liver biopsy which will help guide management options per Oncology, if patient wishes to proceed with full care. Hopefully path back today or tomorrow. I have discussed the situation with the patient and wife and answered their questions. He warrants ongoing therapy with high dose PPI.   [place] Cellar, MD [facility]

## 2018-09-23 NOTE — Progress Notes (Signed)
ANTICOAGULATION CONSULT NOTE - Follow Up Consult  Pharmacy Consult for Heparin Indication: pulmonary embolus  No Known Allergies  Patient Measurements: Height: 5\' 5"  (165.1 cm) Weight: 140 lb 3.2 oz (63.6 kg)(scale b) IBW/kg (Calculated) : 61.5 Heparin Dosing Weight: 63.6 kg  Vital Signs: Temp: 99.4 F (37.4 C) (01/13 0530) Temp Source: Oral (01/13 0530) BP: 116/73 (01/13 0530) Pulse Rate: 62 (01/13 0530)  Labs: Recent Labs    09/21/18 0346  09/22/18 0236 09/22/18 1020 09/23/18 0426  HGB 10.4*  --  10.9*  --  9.9*  HCT 33.2*  --  33.5*  --  31.6*  PLT 269  --  265  --  242  HEPARINUNFRC  --    < > 0.53 0.41 0.30   < > = values in this interval not displayed.    Estimated Creatinine Clearance: 52.5 mL/min (by C-G formula based on SCr of 0.96 mg/dL).  Assessment:  84 yr old male continues on IV heparin for PE.  S/p endoscopy 1/10 which showed giant non-bleeding gastric ulcer and concern for malignancy.  Biopsies pending.  Noted if non-diagnostic, will need liver biopsy.    Heparin level is low therapeutic (0.30) on 1450 units/hr.  Level has trended down some on current infusion rate.  No bleeding reported. On Protonix 40 mg BID.  Goal of Therapy:  Heparin level 0.3-0.7 units/ml Monitor platelets by anticoagulation protocol: Yes   Plan:   Increase heparin drip to 1500 units/hr to try to keep level in target range.  Heparin level ~6-8 hrs after rate change.  Daily heparin level and CBC while on heparin.  Follow up biopsy report and plans.    Arty Baumgartner, [place] Pager: [phone] or phone: [phone] 09/23/2018,11:07 AM

## 2018-09-24 LAB — COMPREHENSIVE METABOLIC PANEL
ALT: 59 U/L — ABNORMAL HIGH (ref 0–44)
AST: 61 U/L — ABNORMAL HIGH (ref 15–41)
Albumin: 2.3 g/dL — ABNORMAL LOW (ref 3.5–5.0)
Alkaline Phosphatase: 113 U/L (ref 38–126)
Anion gap: 8 (ref 5–15)
BUN: 18 mg/dL (ref 8–23)
CO2: 25 mmol/L (ref 22–32)
Calcium: 8.4 mg/dL — ABNORMAL LOW (ref 8.9–10.3)
Chloride: 100 mmol/L (ref 98–111)
Creatinine, Ser: 0.82 mg/dL (ref 0.61–1.24)
GFR calc Af Amer: >60 mL/min (ref >60)
GFR calc non Af Amer: >60 mL/min (ref >60)
Glucose, Bld: 100 mg/dL — ABNORMAL HIGH (ref 70–99)
Potassium: 3.8 mmol/L (ref 3.5–5.1)
Sodium: 133 mmol/L — ABNORMAL LOW (ref 135–145)
Total Bilirubin: 1 mg/dL (ref 0.3–1.2)
Total Protein: 5.6 g/dL — ABNORMAL LOW (ref 6.5–8.1)

## 2018-09-24 LAB — CBC
HCT: 28.3 % — ABNORMAL LOW (ref 39.0–52.0)
Hemoglobin: 9 g/dL — ABNORMAL LOW (ref 13.0–17.0)
MCH: 28.4 pg (ref 26.0–34.0)
MCHC: 31.8 g/dL (ref 30.0–36.0)
MCV: 89.3 fL (ref 80.0–100.0)
Platelets: 221 10*3/uL (ref 150–400)
RBC: 3.17 MIL/uL — AB (ref 4.22–5.81)
RDW: 13.2 % (ref 11.5–15.5)
WBC: 6.3 10*3/uL (ref 4.0–10.5)
nRBC: 0 % (ref 0.0–0.2)

## 2018-09-24 LAB — HEPARIN LEVEL (UNFRACTIONATED): Heparin Unfractionated: 0.35 IU/mL (ref 0.30–0.70)

## 2018-09-24 NOTE — Progress Notes (Signed)
   I received a call from pathology - his gastric biopsies show adenocarcinoma  Gatha Mayer, MD, [facility] 09/24/2018 4:36 PM Pager [phone]

## 2018-09-24 NOTE — Progress Notes (Signed)
PROGRESS NOTE  Peter Wood  JOI:[id number] DOB: [birth year] DOA: 09/19/2018 PCP: Cassandria Anger, MD  Outpatient Specialists: GI, Dr. Carlean Purl. Brief Narrative: Peter Wood is an 84 y.o. male with a history of prostate CA s/p XRT 2015 and HTN who presented to the ED due to incidental finding of PO on CT chest. He's been undergoing work up for multiple hepatic lesions found on CT 09/07/2018 for evaluation of abdominal pain and 25lbs weight loss. After following up with GI 1/8 he had a staging CT chest which revealed a largely asymptomatic pulmonary embolism. He was told to present to the ED and was subsequently admitted on heparin infusion, made NPO and had EGD performed 1/10 showing a giant non-bleeding gastric ulcer which was biopsied. Heparin infusion was continued in case of the need for IR Biopsy. Pathology results are now showing gastric adenocarcinoma, with hepatic involvement is stage IV. Hemoglobin has trended downward, and with a high risk of bleeding from malignant gastric ulcer, he will be monitored overnight on heparin with plans to transition to DOAC once hemoglobin stabilizes.  Assessment & Plan: Principal Problem:   Pulmonary emboli (HCC) Active Problems:   Essential hypertension   Weight loss   Liver metastases (HCC)   Abdominal neoplasm without bowel obstruction   Nausea   Abdominal pain   Acute pulmonary embolus (HCC)  Acute pulmonary embolus: In pt with malignancy NOS. Negative LE dopplers. Echo 1/10 showed mildly dilated RV with normal systolic function, mild pulmonary HTN, LVEF 55-60%, no WMA, no mention of diastolic dysfunction. - Continue IV heparin overnight and transition to [place] 1/15 if hgb stable.   Gastric ulcer, metastatic gastric adenocarcinoma: Found on EGD 09/20/2018, abnormality initially noted on CT with hepatic metastases. CA 19-9 (67,479) and CEA (1,319.2) markedly elevated. AFP (2.9) and PSA (0.54) wnl.  - Continue PPI - Discussed with  oncology, Dr. Alvy Bimler who feels no further work up is currently indicated. Patient has follow up scheduled with Dr. Jana Hakim 1/24.  Anemia of chronic disease: Suspected, due to malignancy. Normocytic with normal ferritin, iron 38, TIBC 251 (LLN 250) and 15% tsat. Previous baseline was normal and has decreased ~2g/dl in 2 days since initiating heparin.  - Continue reversible anticoagulation and recheck CBC in AM. If no bleeding is evident and hemoglobin is relatively stable, should be able to discharge 1/15.   Fever: Uncertain etiology. Only associated symptom is similar abdominal discomfort as he's been having. WBC normal. Urinalysis negative and no urinary symptoms. Leading possibilities are atelectasis complicating PE, VTE, and malignancy. Given the recurrent nature without any symptoms, it's possible he's been having fevers for some time related to malignancy.  - Incentive spirometry - Will continue to monitor off antibiotics. Don't believe blood cultures will be helpful in the absence of other infectious signs/symptoms.  History of prostate CA: No LUTS and negative PSA (0.54)  HTN:  - Continue norvasc, losartan.   DVT prophylaxis: Heparin gtt  Code Status: Full Family Communication: None at bedside this morning Disposition Plan: If no bleeding is evident and hemoglobin is relatively stable, should be able to discharge 1/15.   Consultants:   Gastroenterology, Dr. Carlean Purl  Procedures:   EGD 09/20/2018: Findings:      One non-bleeding giant cratered gastric ulcer with no stigmata of       bleeding was found on the posterior wall of the stomach. . Biopsies were       taken with a cold forceps for histology. Verification of patient  identification for the specimen was done. Estimated blood loss was       minimal.      The exam was otherwise without abnormality. Impression:               - Non-bleeding giant gastric ulcer with no stigmata                            of bleeding.  Biopsied. Looks like cancer.                           - The examination was otherwise normal. Recommendation:           - Return patient to hospital ward for ongoing care.                           - Restart heparin in AM                           Tricky situation                           At increased risk of bleeding but needs                            anticoagulation due to PE                           - Await pathology results.  Antimicrobials:  None   Subjective: Continues to feel well, though not eating very much. Nausea is controlled with medications, no vomiting. Abdominal pain is stable/waxing/waning. Had brown stool today. Denies melena/BRBPR or other unusual bleeding/bruising.  Objective: Vitals:   09/24/18 0427 09/24/18 0855 09/24/18 1200 09/24/18 1627  BP: 109/69 113/79 117/77 111/71  Pulse: (!) 59 (!) 59 65 60  Resp: 18 18 18 18   Temp: 98.4 F (36.9 C) 98.8 F (37.1 C) 98.5 F (36.9 C) 98.4 F (36.9 C)  TempSrc: Oral Oral Oral Oral  SpO2: 98% 100% 98% 100%  Weight:      Height:        Intake/Output Summary (Last 24 hours) at 09/24/2018 1656 Last data filed at 09/24/2018 1200 Gross per 24 hour  Intake 1718.97 ml  Output 550 ml  Net 1168.97 ml   Filed Weights   09/21/18 0547 09/22/18 0436 09/23/18 0530  Weight: 63.3 kg 63.5 kg 63.6 kg   Gen: Pleasant elderly male in no distress Pulm: Nonlabored breathing room air. Clear. CV: Regular rate and rhythm. No murmur, rub, or gallop. No JVD, no dependent edema. GI: Abdomen soft, non-tender, non-distended, with normoactive bowel sounds.  Ext: Warm, no deformities Skin: No rashes, lesions or ulcers on visualized skin. Neuro: Alert and oriented. No focal neurological deficits. Psych: Judgement and insight appear fair. Mood euthymic & affect congruent. Behavior is appropriate.    Data Reviewed: I have personally reviewed following labs and imaging studies  CBC: Recent Labs  Lab 09/20/18 0228 09/21/18 0346  09/22/18 0236 09/23/18 0426 09/24/18 0447  WBC 4.8 4.9 7.9 8.1 6.3  HGB 10.2* 10.4* 10.9* 9.9* 9.0*  HCT 32.5* 33.2* 33.5* 31.6* 28.3*  MCV 90.0 91.0 89.6 90.0 89.3  PLT 271 269 265 242 614   Basic Metabolic  Panel: Recent Labs  Lab 09/19/18 1323 09/20/18 0228 09/24/18 0447  NA 134* 136 133*  K 3.9 3.8 3.8  CL 99 101 100  CO2 26 26 25   GLUCOSE 82 84 100*  BUN 12 9 18   CREATININE 1.01 0.96 0.82  CALCIUM 8.9 8.6* 8.4*   GFR: Estimated Creatinine Clearance: 61.5 mL/min (by C-G formula based on SCr of 0.82 mg/dL).  Coagulation Profile: Recent Labs  Lab 09/19/18 1558  INR 1.33    No results for input(s): VITAMINB12, FOLATE, FERRITIN, TIBC, IRON, RETICCTPCT in the last 72 hours. Urine analysis:    Component Value Date/Time   COLORURINE YELLOW 09/22/2018 [place] 09/22/2018 0541   LABSPEC 1.014 09/22/2018 0541   PHURINE 6.0 09/22/2018 0541   GLUCOSEU NEGATIVE 09/22/2018 0541   GLUCOSEU NEGATIVE 07/20/2017 1056   HGBUR NEGATIVE 09/22/2018 0541   BILIRUBINUR NEGATIVE 09/22/2018 0541   KETONESUR 5 (A) 09/22/2018 0541   PROTEINUR NEGATIVE 09/22/2018 0541   UROBILINOGEN 0.2 07/20/2017 1056   NITRITE NEGATIVE 09/22/2018 0541   LEUKOCYTESUR NEGATIVE 09/22/2018 0541   No results found for this or any previous visit (from the past 240 hour(s)).    Radiology Studies: No results found.  Scheduled Meds: . amLODipine  5 mg Oral Daily  . benazepril  40 mg Oral Daily  . latanoprost  1 drop Both Eyes QHS  . pantoprazole  40 mg Oral BID AC   Continuous Infusions: . heparin 1,550 Units/hr (09/24/18 0902)     LOS: 1 day   Time spent: 25 minutes.  Patrecia Pour, MD [facility] [email or web address] Password [facility] 09/24/2018, 4:56 PM

## 2018-09-24 NOTE — Progress Notes (Signed)
ANTICOAGULATION CONSULT NOTE - Follow Up Consult  Pharmacy Consult for Heparin Indication: pulmonary embolus  No Known Allergies  Patient Measurements: Height: 5\' 5"  (165.1 cm) Weight: 140 lb 3.2 oz (63.6 kg)(scale b) IBW/kg (Calculated) : 61.5 Heparin Dosing Weight: 63.6 kg  Vital Signs: Temp: 98.8 F (37.1 C) (01/14 0855) Temp Source: Oral (01/14 0855) BP: 113/79 (01/14 0855) Pulse Rate: 59 (01/14 0855)  Labs: Recent Labs    09/22/18 0236  09/23/18 0426 09/23/18 1750 09/24/18 0447  HGB 10.9*  --  9.9*  --  9.0*  HCT 33.5*  --  31.6*  --  28.3*  PLT 265  --  242  --  221  HEPARINUNFRC 0.53   < > 0.30 0.31 0.35  CREATININE  --   --   --   --  0.82   < > = values in this interval not displayed.    Estimated Creatinine Clearance: 61.5 mL/min (by C-G formula based on SCr of 0.82 mg/dL).  Assessment:  84 yr old male continues on IV heparin for PE.  S/p endoscopy 1/10 which showed giant non-bleeding gastric ulcer and concern for malignancy.  Biopsies pending.  Noted if non-diagnostic, will need liver biopsy.    Heparin level is low therapeutic (0.35) on 1550 units/hr. Two small rate changes (each of 50 units/hr) on 1/13 when heparin level was 0.30 and 0.31.  No bleeding reported. On Protonix 40 mg BID.  Goal of Therapy:  Heparin level 0.3-0.7 units/ml Monitor platelets by anticoagulation protocol: Yes   Plan:   Continue heparin drip at 1550 units/hr.  Daily heparin level and CBC while on heparin.  Follow up biopsy report and plans.    Arty Baumgartner, [place] Pager: [phone] or phone: [phone] 09/24/2018,10:46 AM

## 2018-09-24 NOTE — Progress Notes (Signed)
          Daily Rounding Note  09/24/2018, 9:34 AM  LOS: 1 day   SUBJECTIVE:   Chief complaint: Gastric ulcer, liver mets.  PE.     Feels well.  Woke up with nausea overnight, resolved with Zofran.  No emesis.  Formed, brown stools.    OBJECTIVE:         Vital signs in last 24 hours:    Temp:  [98.4 F (36.9 C)-98.8 F (37.1 C)] 98.8 F (37.1 C) (01/14 0855) Pulse Rate:  [59-64] 59 (01/14 0855) Resp:  [18] 18 (01/14 0855) BP: (106-114)/(69-79) 113/79 (01/14 0855) SpO2:  [98 %-100 %] 100 % (01/14 0855) Last BM Date: 09/22/18 Filed Weights   09/21/18 0547 09/22/18 0436 09/23/18 0530  Weight: 63.3 kg 63.5 kg 63.6 kg   General: Looks well.  Cheerful, alert, comfortable. Heart: RRR. Chest: Clear bilaterally.  No labored breathing or cough. Abdomen: Soft.  Not tender, not distended.  Active bowel sounds. Extremities: No CCE. Neuro/Psych: Fully oriented and alert.  Calm.  Fluid speech.  No limb weakness.  Intake/Output from previous day: 01/13 0701 - 01/14 0700 In: 1799 [P.O.:960; I.V.:839] Out: 300 [Urine:300]  Intake/Output this shift: Total I/O In: -  Out: 250 [Urine:250]  Lab Results: Recent Labs    09/22/18 0236 09/23/18 0426 09/24/18 0447  WBC 7.9 8.1 6.3  HGB 10.9* 9.9* 9.0*  HCT 33.5* 31.6* 28.3*  PLT 265 242 221   BMET Recent Labs    09/24/18 0447  NA 133*  K 3.8  CL 100  CO2 25  GLUCOSE 100*  BUN 18  CREATININE 0.82  CALCIUM 8.4*   LFT Recent Labs    09/24/18 0447  PROT 5.6*  ALBUMIN 2.3*  AST 61*  ALT 59*  ALKPHOS 113  BILITOT 1.0   Scheduled Meds: . amLODipine  5 mg Oral Daily  . benazepril  40 mg Oral Daily  . latanoprost  1 drop Both Eyes QHS  . pantoprazole  40 mg Oral BID AC   Continuous Infusions: . heparin 1,550 Units/hr (09/24/18 0902)   PRN Meds:.ondansetron (ZOFRAN) IV, promethazine-codeine   ASSESMENT:   *   Giant gastric ulcer, concerning for malignancy.  25 #  weight loss. Still waiting on pathology.   CA-19-9 and CEA markedly elevated.  Continues on BID PO Protonix  *    Multiple liver lesions, suspicious for metastatic disease. Had no recurrent adenomas on colonoscopy 04/2017  *     New diagnosis of PE.  No DVT. On IV heparin.  Coumadin not initiated until we can confirm tissue diagnosis of either the stomach ulcer or liver lesions  *     Normocytic anemia.  PLAN   *   Called pathology department, likely path report will be read by this afternoon but no promises.    Azucena Freed  09/24/2018, 9:34 AM Phone [phone]

## 2018-09-25 ENCOUNTER — Other Ambulatory Visit: Payer: Self-pay | Admitting: Physician Assistant

## 2018-09-25 DIAGNOSIS — I2699 Other pulmonary embolism without acute cor pulmonale: Secondary | ICD-10-CM

## 2018-09-25 DIAGNOSIS — D649 Anemia, unspecified: Secondary | ICD-10-CM

## 2018-09-25 LAB — CBC
HEMATOCRIT: 26.5 % — AB (ref 39.0–52.0)
Hemoglobin: 8.4 g/dL — ABNORMAL LOW (ref 13.0–17.0)
MCH: 28.5 pg (ref 26.0–34.0)
MCHC: 31.7 g/dL (ref 30.0–36.0)
MCV: 89.8 fL (ref 80.0–100.0)
Platelets: 251 10*3/uL (ref 150–400)
RBC: 2.95 MIL/uL — ABNORMAL LOW (ref 4.22–5.81)
RDW: 13.3 % (ref 11.5–15.5)
WBC: 5.2 10*3/uL (ref 4.0–10.5)
nRBC: 0 % (ref 0.0–0.2)

## 2018-09-25 LAB — HEPARIN LEVEL (UNFRACTIONATED): Heparin Unfractionated: 0.47 IU/mL (ref 0.30–0.70)

## 2018-09-25 MED ORDER — ELIQUIS 5 MG VTE STARTER PACK: ORAL_TABLET | ORAL | 0 refills | Status: DC

## 2018-09-25 MED ORDER — APIXABAN 5 MG PO TABS
10.0000 mg | ORAL_TABLET | Freq: Two times a day (BID) | ORAL | Status: DC
  Administered 2018-09-25: 10 mg via ORAL
  Filled 2018-09-25 (×2): qty 2

## 2018-09-25 MED ORDER — APIXABAN 5 MG PO TABS: 5.0000 mg | ORAL_TABLET | Freq: Two times a day (BID) | ORAL | Status: DC

## 2018-09-25 MED ORDER — PANTOPRAZOLE SODIUM 40 MG PO TBEC: 40.0000 mg | DELAYED_RELEASE_TABLET | Freq: Two times a day (BID) | ORAL | 0 refills | Status: DC

## 2018-09-25 MED FILL — PANTOPRAZOLE SOD DR 40 MG T: 40 | 30 days supply | Qty: 60 | Fill #0

## 2018-09-25 MED FILL — ELIQUIS STARTER PACK 5 MG T: 5 | 30 days supply | Qty: 74 | Fill #0

## 2018-09-25 NOTE — Progress Notes (Signed)
Gastric adenocarcinnoma Discussed w/ dr. Bonner Puna and Ardis Hughs

## 2018-09-25 NOTE — Progress Notes (Signed)
ANTICOAGULATION CONSULT NOTE - Follow Up Consult  Pharmacy Consult for Heparin>Eliquis Indication: pulmonary embolus  No Known Allergies  Patient Measurements: Height: 5\' 5"  (165.1 cm) Weight: 141 lb 8 oz (64.2 kg)(scale b) IBW/kg (Calculated) : 61.5 Heparin Dosing Weight: 64 kg  Vital Signs: Temp: 98 F (36.7 C) (01/15 0542) Temp Source: Oral (01/15 0542) BP: 104/68 (01/15 0857) Pulse Rate: 68 (01/15 0542)  Labs: Recent Labs    09/23/18 0426 09/23/18 1750 09/24/18 0447 09/25/18 0449  HGB 9.9*  --  9.0* 8.4*  HCT 31.6*  --  28.3* 26.5*  PLT 242  --  221 251  HEPARINUNFRC 0.30 0.31 0.35 0.47  CREATININE  --   --  0.82  --     Estimated Creatinine Clearance: 61.5 mL/min (by C-G formula based on SCr of 0.82 mg/dL).  Assessment:  84 yr old male continues on IV heparin for PE.  S/p endoscopy 1/10 which showed giant non-bleeding gastric ulcer and concern for malignancy, now noted confirmed.     Heparin level is therapeutic (0.47) on 1550 units/hr. Hgb trended down, no bleeding reported. On Protonix 40 mg BID.  Goal of Therapy:  Heparin level 0.3-0.7 units/ml Monitor platelets by anticoagulation protocol: Yes   Plan:   Transition IV heparin to Eliquis 10 mg BID x 7 days then 5 mg BID.  IV heparin to stop when first Eliquis dose given.   Arty Baumgartner, [place] Pager: [phone] or phone: [phone] 09/25/2018,10:26 AM

## 2018-09-25 NOTE — Progress Notes (Signed)
Patient ready for discharge. 

## 2018-09-25 NOTE — Discharge Summary (Signed)
Physician Discharge Summary  Peter Wood RXV:[id number] DOB: 1934/11/27 DOA: 09/19/2018  PCP: Cassandria Anger, MD  Admit date: 09/19/2018 Discharge date: 09/25/2018  Admitted From: Home Disposition:  Home  Discharge Condition:Stable CODE STATUS:FULL Diet recommendation: Heart Healthy   Brief/Interim Summary:  Peter Wood is an 84 y.o. male with a history of prostate CA s/p XRT 2015 and HTN who presented to the ED due to incidental finding of PE on CT chest. He's been undergoing work up for multiple hepatic lesions found on CT 09/07/2018 for evaluation of abdominal pain and 25lbs weight loss. After following up with GI 1/8 he had a staging CT chest which revealed a largely asymptomatic pulmonary embolism. He was told to present to the ED and was subsequently admitted on heparin infusion, made NPO and had EGD performed 1/10 showing a giant non-bleeding gastric ulcer which was biopsied. Heparin infusion was continued in case of the need for IR Biopsy. Pathology results are now showing gastric adenocarcinoma, with hepatic involvement is stage IV. Hemoglobin  trended downward, and with a high risk of bleeding from malignant gastric ulcer.Hb this morning is 8.4.  Discussed with GI this morning.  We will start him on Eliquis for PE.  He is hemodynamically stable for discharge today to home. He will follow-up with oncology and GI as an outpatient.   Following problems were addressed during his hospitalization:  Acute pulmonary embolus:Negative LE dopplers. Echo 1/10 showed mildly dilated RV with normal systolic function, mild pulmonary HTN, LVEF 55-60%, no WMA, no mention of diastolic dysfunction.Most likely associated with malignancy. Started on Eliquis.   Gastric ulcer, metastatic gastric adenocarcinoma: Found on EGD 09/20/2018, abnormality initially noted on CT with hepatic metastases. CA 19-9 (67,479) and CEA (1,319.2) markedly elevated. AFP (2.9) and PSA (0.54) wnl.  Continue  PPI. Discussed with oncology, Dr. Alvy Bimler who feels no further work up is currently indicated. Patient has follow up scheduled with Dr. Jana Hakim 1/24.  Anemia of chronic disease: Suspected, due to malignancy. Normocytic with normal ferritin, iron 38, TIBC 251 (LLN 250) and 15% tsat. Previous baseline was normal and has decreased ~2g/dl in 2 days since initiating heparin.  Hb this morning in 8.4.  No bloody bowel movements.  Fever: Resolved  History of prostate CA: No LUTS and negative PSA (0.54)  HTN:  Continue norvasc, losartan   Discharge Diagnoses:  Principal Problem:   Pulmonary emboli ([place]) Active Problems:   Essential hypertension   Weight loss   Liver metastases ([facility])   Abdominal neoplasm without bowel obstruction   Nausea   Abdominal pain   Acute pulmonary embolus [facility])    Discharge Instructions  Discharge Instructions    Diet - low sodium heart healthy   Complete by:  As directed    Discharge instructions   Complete by:  As directed    1) Follow up with your PCP in a week.Do a CBC test to check your hemoglobin during the follow up. 2)Follow up with oncology and gastroenterology as an outpatient. 3)Take prescribed medications as instructed.   Increase activity slowly   Complete by:  As directed      Allergies as of 09/25/2018   No Known Allergies     Medication List    STOP taking these medications   azithromycin 250 MG tablet Commonly known as:  ZITHROMAX Z-PAK     TAKE these medications   amLODipine 5 MG tablet Commonly known as:  NORVASC Take 1 tablet (5 mg total) daily by mouth.  benazepril 40 MG tablet Commonly known as:  LOTENSIN TAKE ONE TABLET BY MOUTH ONE TIME DAILY   Cholecalciferol 25 MCG (1000 UT) tablet Take 1,000 Units by mouth daily.   ELIQUIS STARTER PACK 5 MG Tabs Take as directed on package: start with two-5mg  tablets twice daily for 7 days. On day 8, switch to one-5mg  tablet twice daily.   latanoprost 0.005 %  ophthalmic solution Commonly known as:  XALATAN Place 1 drop into both eyes at bedtime.   naproxen sodium 220 MG tablet Commonly known as:  ALEVE Take 220-440 mg by mouth 2 (two) times daily as needed (for pain).   oxyCODONE 5 MG immediate release tablet Commonly known as:  ROXICODONE Take 1 tablet (5 mg total) by mouth every 4 (four) hours as needed for severe pain.   pantoprazole 40 MG tablet Commonly known as:  PROTONIX Take 1 tablet (40 mg total) by mouth 2 (two) times daily before a meal.   promethazine-codeine 6.25-10 MG/5ML syrup Commonly known as:  PHENERGAN with CODEINE Take 5 mLs by mouth every 4 (four) hours as needed. What changed:  reasons to take this      Follow-up Information    Plotnikov, Evie Lacks, MD. Schedule an appointment as soon as possible for a visit in 1 week(s).   Specialty:  Internal Medicine Contact information: [place] [address] [phone]          No Known Allergies  Consultations:  Oncology,GI   Procedures/Studies: Ct Chest W Contrast  Result Date: 09/19/2018 CLINICAL DATA:  Abnormal CT abdomen demonstrating hepatic metastases EXAM: CT CHEST WITH CONTRAST TECHNIQUE: Multidetector CT imaging of the chest was performed during intravenous contrast administration. Sagittal and coronal MPR images reconstructed from axial data set. CONTRAST:  88mL ISOVUE-300 IOPAMIDOL (ISOVUE-300) INJECTION 61% IV COMPARISON:  CT abdomen and pelvis 09/07/2018 FINDINGS: Cardiovascular: Minimal atherosclerotic calcification aorta. Aorta normal caliber without aneurysm or dissection. Filling defects identified in RIGHT lower lobe pulmonary arteries consistent with pulmonary emboli. Remaining pulmonary arteries patent. Small pericardial effusion. Mediastinum/Nodes: Calcified mediastinal and RIGHT hilar lymph nodes. 2.5 x 1.8 cm diameter LEFT thyroid mass, low-attenuation. Base of cervical region otherwise unremarkable. Enlarged subcarinal lymph  node 14 mm short axis image 75. No definite esophageal abnormalities. Lungs/Pleura: Minimal dependent atelectasis posterior RIGHT lower lobe. Questionable areas of scarring at lateral RIGHT middle lobe and at medial LEFT lower lobe. Calcified granulomata RIGHT upper lobe image 42 and LEFT upper lobe image 67. Noncalcified 4 mm RIGHT upper lobe nodule image 74. No acute infiltrate, pleural effusion or pneumothorax. Upper Abdomen: Numerous poorly defined hepatic masses compatible with widespread metastatic disease. Enlarged gastrohepatic ligament lymph node 15 mm short axis image 138. Questionable wall thickening of the proximal stomach versus artifact related underdistention; stomach had a similar appearance on the previous exam favor wall thickening, cannot exclude neoplasm. Probable enlarged periportal lymph nodes. Musculoskeletal: No osseous metastatic lesions. IMPRESSION: Hepatic metastatic disease. Enlarged gastrohepatic ligament, periportal, and subcarinal lymph nodes/adenopathy. Gastric wall thickening suspicious for gastric neoplasm extending to the gastroesophageal junction, recommend endoscopy. Old granulomatous disease within chest. Filling defects in RIGHT lower lobe pulmonary arteries consistent with pulmonary embolism. Small pericardial effusion. Low attenuation 2.4 cm diameter mass LEFT thyroid lobe, question cystic versus solid; follow-up thyroid sonography recommended. Aortic Atherosclerosis (ICD10-I70.0). Critical Value/emergent results were called by telephone at the time of interpretation on 09/19/2018 at 9:57 am to Tye Savoy NP, who verbally acknowledged these results. Electronically Signed   By: Lavonia Dana  M.D.   On: 09/19/2018 10:00   Ct Abdomen Pelvis W Contrast  Result Date: 09/07/2018 CLINICAL DATA:  Left lower quadrant pain since 5 a.m. History of prostate cancer without surgery. EXAM: CT ABDOMEN AND PELVIS WITH CONTRAST TECHNIQUE: Multidetector CT imaging of the abdomen and pelvis  was performed using the standard protocol following bolus administration of intravenous contrast. CONTRAST:  147mL ISOVUE-300 IOPAMIDOL (ISOVUE-300) INJECTION 61% COMPARISON:  Same day abdominal ultrasound. FINDINGS: Lower chest: Subpleural areas of fine reticular markings are identified in the periphery of the right middle lobe and medial left lower lobe suspicious for atelectasis and/or scarring. Hepatobiliary: Too numerous to count hypodense masses of the liver are identified in keeping with findings on earlier same day ultrasound of the abdomen. Findings are concerning for hepatic metastasis. No biliary dilatation. Gallbladder is free of stones. No choledocholithiasis. Pancreas: In the region of the porta hepatis, there are hypodense lesions more likely associated with the liver though the pancreatic head is partially obscured by 3.2 cm hypodense mass. Findings are more likely associated with the liver and less likely an isolated pancreatic mass. No pathologic ductal dilatation of the pancreas is seen. The pancreatic duct as visualized is within normal limits in caliber measuring 1-2 mm. No inflammation of the pancreas is seen. Spleen: Normal Adrenals/Urinary Tract: No adrenal mass. Subcentimeter hypodensities are seen within both kidneys statistically consistent with cysts but are too small to characterize. No hydroureteronephrosis or nephrolithiasis. The urinary bladder is physiologically distended and unremarkable. Stomach/Bowel: Small hiatal hernia with coapted gastroesophageal juncture limiting further assessment in this portion. There is mild thickening of this portion of the stomach as well as distal body and antrum. The duodenal bulb and sweep, small and large intestine are nonacute. Moderate stool retention within the colon is identified without bowel obstruction or inflammation. Vascular/Lymphatic: Minimally atherosclerotic abdominal aorta without aneurysm. Mesenteric nodule in the mid abdomen, series  7/31 may represent a mildly enlarged lymph node up to 17 mm versus partial volume averaging of small bowel. PET-CT may help for further correlation. Peripancreatic 17 mm lymph node posterior to the stomach, series 7/16 is also identified. Reproductive: Brachia therapy seeds are identified within the prostate. No localized soft tissue involvement. Other: No free air. No free fluid. Degenerative changes are present along the dorsal spine and both hips. No aggressive osseous lesions. Small sclerotic density in the left iliac bone adjacent to the SI joint measuring 6 mm is noted, nonspecific in isolation. Given history of prostate cancer, differential considerations would include osteoblastic metastasis or possibly bone island. Musculoskeletal: IMPRESSION: 1. Too numerous to count hypodense masses of the liver concerning for hepatic metastasis. In the region of the porta hepatis, there are hypodense lesions more likely associated with the liver though the pancreatic head is partially obscured by a 3.2 cm hypodense mass. Findings are more likely associated with the liver and less likely an isolated pancreatic mass. 2. Upper abdominal mesenteric and peripancreatic lymphadenopathy suspicious for metastatic disease. 3. 6 mm sclerotic density in the left iliac bone adjacent to the SI joint. Given history of prostate cancer, differential considerations would include osteoblastic metastasis or possibly bone island. Electronically Signed   By: Ashley Royalty M.D.   On: 09/07/2018 19:10   US Abdomen Limited  Result Date: 09/07/2018 CLINICAL DATA:  Right upper quadrant abdominal pain. EXAM: ULTRASOUND ABDOMEN LIMITED RIGHT UPPER QUADRANT COMPARISON:  None. FINDINGS: Gallbladder: No cholelithiasis. No gallbladder wall thickening. Negative sonographic Murphy sign. Common bile duct: Diameter: 10 mm Liver: Multiple  mixed echogenicity masses are demonstrated throughout the liver with the largest measuring up to 4.1 x 2.4 x 3.3 cm in  the left hepatic lobe. Within the region of the porta hepatis there is a 5.8 x 2.2 cm mixed echogenicity mass. Portal vein is patent on color Doppler imaging with normal direction of blood flow towards the liver. IMPRESSION: 1. Multiple mixed echogenicity masses throughout the liver concerning for possibility of metastatic disease. 2. Heterogeneous mass within the porta hepatis may represent pancreatic mass or hepatic mass, of uncertain etiology on current examination. Possibility of pancreatic malignancy not excluded. 3. Common bile duct is dilated, potentially secondary to distal obstructing mass. 4. Given the multitude of findings, recommend further evaluation with contrast-enhanced CT or MRI. Electronically Signed   By: Lovey Newcomer M.D.   On: 09/07/2018 16:04   Vas [place] Lower Extremity Venous (dvt)  Result Date: 09/20/2018  Lower Venous Study Indications: Pulmonary embolism.  Performing Technologist: Oliver Hum RVT  Examination Guidelines: A complete evaluation includes B-mode imaging, spectral Doppler, color Doppler, and power Doppler as needed of all accessible portions of each vessel. Bilateral testing is considered an integral part of a complete examination. Limited examinations for reoccurring indications may be performed as noted.  Right Venous Findings: +---------+---------------+---------+-----------+----------+-------+          CompressibilityPhasicitySpontaneityPropertiesSummary +---------+---------------+---------+-----------+----------+-------+ CFV      Full           Yes      Yes                          +---------+---------------+---------+-----------+----------+-------+ SFJ      Full                                                 +---------+---------------+---------+-----------+----------+-------+ FV Prox  Full                                                 +---------+---------------+---------+-----------+----------+-------+ FV Mid   Full                                                  +---------+---------------+---------+-----------+----------+-------+ FV DistalFull                                                 +---------+---------------+---------+-----------+----------+-------+ PFV      Full                                                 +---------+---------------+---------+-----------+----------+-------+ POP      Full           Yes      Yes                          +---------+---------------+---------+-----------+----------+-------+ PTV  Full                                                 +---------+---------------+---------+-----------+----------+-------+ PERO     Full                                                 +---------+---------------+---------+-----------+----------+-------+  Left Venous Findings: +---------+---------------+---------+-----------+----------+-------+          CompressibilityPhasicitySpontaneityPropertiesSummary +---------+---------------+---------+-----------+----------+-------+ CFV      Full           Yes      Yes                          +---------+---------------+---------+-----------+----------+-------+ SFJ      Full                                                 +---------+---------------+---------+-----------+----------+-------+ FV Prox  Full                                                 +---------+---------------+---------+-----------+----------+-------+ FV Mid   Full                                                 +---------+---------------+---------+-----------+----------+-------+ FV DistalFull                                                 +---------+---------------+---------+-----------+----------+-------+ PFV      Full                                                 +---------+---------------+---------+-----------+----------+-------+ POP      Full           Yes      Yes                           +---------+---------------+---------+-----------+----------+-------+ PTV      Full                                                 +---------+---------------+---------+-----------+----------+-------+ PERO     Full                                                 +---------+---------------+---------+-----------+----------+-------+  Summary: Right: There is no evidence of deep vein thrombosis in the lower extremity. No cystic structure found in the popliteal fossa. Left: There is no evidence of deep vein thrombosis in the lower extremity. No cystic structure found in the popliteal fossa.  *See table(s) above for measurements and observations. Electronically signed by Monica Martinez MD on 09/20/2018 at 12:29:36 PM.    Final        Subjective: Patient seen and examined the bedside this morning.  Remains comfortable.  Did not complain of any abdominal pain, bloody bowel movements.  Hemodynamically stable for discharge  Discharge Exam: Vitals:   09/25/18 0542 09/25/18 0857  BP: 103/75 104/68  Pulse: 68   Resp: 18   Temp: 98 F (36.7 C)   SpO2: 98%    Vitals:   09/24/18 1627 09/24/18 1925 09/25/18 0542 09/25/18 0857  BP: 111/71 113/64 103/75 104/68  Pulse: 60 66 68   Resp: 18 18 18    Temp: 98.4 F (36.9 C) 98.2 F (36.8 C) 98 F (36.7 C)   TempSrc: Oral Oral Oral   SpO2: 100% 100% 98%   Weight:   64.2 kg   Height:        General: Pt is alert, awake, not in acute distress Cardiovascular: RRR, S1/S2 +, no rubs, no gallops Respiratory: CTA bilaterally, no wheezing, no rhonchi Abdominal: Soft, NT, ND, bowel sounds + Extremities: no edema, no cyanosis    The results of significant diagnostics from this hospitalization (including imaging, microbiology, ancillary and laboratory) are listed below for reference.     Microbiology: No results found for this or any previous visit (from the past 240 hour(s)).   Labs: BNP (last 3 results) No results for input(s): BNP in  the last 8760 hours. Basic Metabolic Panel: Recent Labs  Lab 09/19/18 1323 09/20/18 0228 09/24/18 0447  NA 134* 136 133*  K 3.9 3.8 3.8  CL 99 101 100  CO2 26 26 25   GLUCOSE 82 84 100*  BUN 12 9 18   CREATININE 1.01 0.96 0.82  CALCIUM 8.9 8.6* 8.4*   Liver Function Tests: Recent Labs  Lab 09/24/18 0447  AST 61*  ALT 59*  ALKPHOS 113  BILITOT 1.0  PROT 5.6*  ALBUMIN 2.3*   No results for input(s): LIPASE, AMYLASE in the last 168 hours. No results for input(s): AMMONIA in the last 168 hours. CBC: Recent Labs  Lab 09/21/18 0346 09/22/18 0236 09/23/18 0426 09/24/18 0447 09/25/18 0449  WBC 4.9 7.9 8.1 6.3 5.2  HGB 10.4* 10.9* 9.9* 9.0* 8.4*  HCT 33.2* 33.5* 31.6* 28.3* 26.5*  MCV 91.0 89.6 90.0 89.3 89.8  PLT 269 265 242 221 251   Cardiac Enzymes: No results for input(s): CKTOTAL, CKMB, CKMBINDEX, TROPONINI in the last 168 hours. BNP: Invalid input(s): POCBNP CBG: No results for input(s): GLUCAP in the last 168 hours. D-Dimer No results for input(s): DDIMER in the last 72 hours. Hgb A1c No results for input(s): HGBA1C in the last 72 hours. Lipid Profile No results for input(s): CHOL, HDL, LDLCALC, TRIG, CHOLHDL, LDLDIRECT in the last 72 hours. Thyroid function studies No results for input(s): TSH, T4TOTAL, T3FREE, THYROIDAB in the last 72 hours.  Invalid input(s): FREET3 Anemia work up No results for input(s): VITAMINB12, FOLATE, FERRITIN, TIBC, IRON, RETICCTPCT in the last 72 hours. Urinalysis    Component Value Date/Time   COLORURINE YELLOW 09/22/2018 [place] 09/22/2018 0541   LABSPEC 1.014 09/22/2018 0541   PHURINE 6.0 09/22/2018 0541  GLUCOSEU NEGATIVE 09/22/2018 0541   GLUCOSEU NEGATIVE 07/20/2017 1056   [place] 09/22/2018 0541   BILIRUBINUR NEGATIVE 09/22/2018 0541   KETONESUR 5 (A) 09/22/2018 0541   PROTEINUR NEGATIVE 09/22/2018 0541   UROBILINOGEN 0.2 07/20/2017 1056   NITRITE NEGATIVE 09/22/2018 0541    LEUKOCYTESUR NEGATIVE 09/22/2018 0541   Sepsis Labs Invalid input(s): PROCALCITONIN,  WBC,  LACTICIDVEN Microbiology No results found for this or any previous visit (from the past 240 hour(s)).  Please note: You were cared for by a hospitalist during your hospital stay. Once you are discharged, your primary care physician will handle any further medical issues. Please note that NO REFILLS for any discharge medications will be authorized once you are discharged, as it is imperative that you return to your primary care physician (or establish a relationship with a primary care physician if you do not have one) for your post hospital discharge needs so that they can reassess your need for medications and monitor your lab values.    Time coordinating discharge: 40 minutes  SIGNED:   Shelly Coss, MD  [facility] 09/25/2018, 11:42 AM Pager [phone]  If 7PM-7AM, please contact night-coverage [email or web address] Password [email or web address]

## 2018-09-25 NOTE — Plan of Care (Signed)
  Problem: Health Behavior/Discharge Planning: Goal: Ability to manage health-related needs will improve Outcome: Adequate for Discharge   

## 2018-09-25 NOTE — Progress Notes (Signed)
COURTESY NOTE:  Peter Wood' gastric Bx is positive for adenoCA. I am requesting additional tests on this material to help determine if we are dealing with a metastasis from a pancreatic/primary biliary tumor. I discussed this with the patient in a preliminary manner and he is aware of the appt to see me 1/24. He will also have genetics testing at that time  GM

## 2018-09-25 NOTE — Progress Notes (Signed)
          Daily Rounding Note  09/25/2018, 8:38 AM  LOS: 2 days   SUBJECTIVE:   Chief complaint: Gastric cancer, liver mets.     Dr. Jana Hakim saw the patient this morning.   At this point the plan is for the patient to see Dr. Jana Hakim back in the office next week on 1/24. Patient has had some nausea but no vomiting.  Nausea is not related to p.o. intake.  It is relieved with Zofran. Still on heparin drip.  OBJECTIVE:         Vital signs in last 24 hours:    Temp:  [98 F (36.7 C)-98.8 F (37.1 C)] 98 F (36.7 C) (01/15 0542) Pulse Rate:  [59-68] 68 (01/15 0542) Resp:  [18] 18 (01/15 0542) BP: (103-117)/(64-79) 103/75 (01/15 0542) SpO2:  [98 %-100 %] 98 % (01/15 0542) Weight:  [64.2 kg] 64.2 kg (01/15 0542) Last BM Date: 09/22/18 Filed Weights   09/22/18 0436 09/23/18 0530 09/25/18 0542  Weight: 63.5 kg 63.6 kg 64.2 kg   General: Very pleasant, alert.  Does not look acutely ill. Heart: RRR. Chest: Air bilaterally.  No labored breathing or cough. Abdomen: Soft.  Active bowel sounds.  [place]oy tender. Extremities: No CCE. Neuro/Psych: Very calm, pleasant, cooperative, courteous.  Seems to be taking everything in stride.    Intake/Output from previous day: 01/14 0701 - 01/15 0700 In: 520 [P.O.:520] Out: 800 [Urine:800]  Intake/Output this shift: Total I/O In: -  Out: 225 [Urine:225]  Lab Results: Recent Labs    09/23/18 0426 09/24/18 0447 09/25/18 0449  WBC 8.1 6.3 5.2  HGB 9.9* 9.0* 8.4*  HCT 31.6* 28.3* 26.5*  PLT 242 221 251   BMET Recent Labs    09/24/18 0447  NA 133*  K 3.8  CL 100  CO2 25  GLUCOSE 100*  BUN 18  CREATININE 0.82  CALCIUM 8.4*   LFT Recent Labs    09/24/18 0447  PROT 5.6*  ALBUMIN 2.3*  AST 61*  ALT 59*  ALKPHOS 113  BILITOT 1.0   Pathology: Adenocarcinoma  Scheduled Meds: . amLODipine  5 mg Oral Daily  . benazepril  40 mg Oral Daily  . latanoprost  1 drop Both Eyes QHS   . pantoprazole  40 mg Oral BID AC   Continuous Infusions: . heparin 1,550 Units/hr (09/25/18 0113)   PRN Meds:.ondansetron (ZOFRAN) IV, promethazine-codeine   ASSESMENT:   *   Giant, malignant gastric ulcer, liver mets.  Pathology from gastric ulcer shows adenoca. Continues on BID PO Protonix.    *     New diagnosis of PE.  No DVT. On IV heparin.    *     Normocytic anemia. Hgb drop 2.5 gm since admit, 3.3 over 2 to 3 weeks.  No melena or bloody BM.     PLAN   *   Has outpt appt with Dr Jana Hakim on 1/24.  Dr Jerilynn Mages ordered additional studies on path specimen.    *   Hospitalist stopped heparin and started Eliquis.  *    I arranged outpatient CBC at the [facility] lab for next week.  Send results to Dr Jana Hakim and Ardis Hughs.       Azucena Freed  09/25/2018, 8:38 AM Phone [phone]

## 2018-09-25 NOTE — Progress Notes (Signed)
Eliquis coupon card given to patient with instructions of usage; Aneta Mins [id number]

## 2018-09-25 NOTE — Discharge Instructions (Signed)
Information on my medicine - ELIQUIS (apixaban)  This medication education was reviewed with me or my healthcare representative as part of my discharge preparation.  The pharmacist that spoke with me during my hospital stay was:  Arty Baumgartner, [facility]  Why was Eliquis prescribed for you? Eliquis was prescribed to treat blood clots that may have been found in the veins of your legs (deep vein thrombosis) or in your lungs (pulmonary embolism) and to reduce the risk of them occurring again.  What do You need to know about Eliquis ? The starting dose is 10 mg (two 5 mg tablets) taken TWICE daily for the FIRST SEVEN (7) DAYS, then on (enter date)  10/02/18  the dose is reduced to ONE 5 mg tablet taken TWICE daily.  Eliquis may be taken with or without food.   Try to take the dose about the same time in the morning and in the evening. If you have difficulty swallowing the tablet whole please discuss with your pharmacist how to take the medication safely.  Take Eliquis exactly as prescribed and DO NOT stop taking Eliquis without talking to the doctor who prescribed the medication.  Stopping may increase your risk of developing a new blood clot.  Refill your prescription before you run out.  After discharge, you should have regular check-up appointments with your healthcare provider that is prescribing your Eliquis.    What do you do if you miss a dose? If a dose of ELIQUIS is not taken at the scheduled time, take it as soon as possible on the same day and twice-daily administration should be resumed. The dose should not be doubled to make up for a missed dose.  Important Safety Information A possible side effect of Eliquis is bleeding. You should call your healthcare provider right away if you experience any of the following: ? Bleeding from an injury or your nose that does not stop. ? Unusual colored urine (red or dark brown) or unusual colored stools (red or black). ? Unusual  bruising for unknown reasons. ? A serious fall or if you hit your head (even if there is no bleeding).  Some medicines may interact with Eliquis and might increase your risk of bleeding or clotting while on Eliquis. To help avoid this, consult your healthcare provider or pharmacist prior to using any new prescription or non-prescription medications, including herbals, vitamins, non-steroidal anti-inflammatory drugs (NSAIDs) and supplements.  This website has more information on Eliquis (apixaban): http://www.eliquis.com/eliquis/home

## 2018-09-26 ENCOUNTER — Other Ambulatory Visit: Payer: Self-pay

## 2018-09-26 ENCOUNTER — Other Ambulatory Visit: Payer: Self-pay | Admitting: Radiology

## 2018-09-26 ENCOUNTER — Telehealth: Payer: Self-pay | Admitting: *Deleted

## 2018-09-26 ENCOUNTER — Telehealth: Payer: Self-pay | Admitting: Gastroenterology

## 2018-09-26 DIAGNOSIS — K921 Melena: Secondary | ICD-10-CM

## 2018-09-26 NOTE — Telephone Encounter (Signed)
[phone] (Cell#)

## 2018-09-26 NOTE — Telephone Encounter (Signed)
Peter Wood this pt last saw Nevin Bloodgood thanks

## 2018-09-26 NOTE — Telephone Encounter (Signed)
Dr Carlean Purl spoke with the patient. Patient is instructed to hold Eliquis today and tomorrow. CBC in the morning here at [facility] on [facility]. He is advised to go to the ER if he feels weak or worsens. Message to Dr Carlean Purl to approve or amend.

## 2018-09-26 NOTE — Telephone Encounter (Signed)
Left message with [facility]. Cancelled the liver biopsy for 09/27/2018. Patient is aware this is cancelled and he is not to have the biopsy done.

## 2018-09-26 NOTE — Telephone Encounter (Signed)
Transition Care Management Follow-up Telephone Call   Date discharged? 09/25/18   How have you been since you were released from the hospital? Spoke w/pt wife she states he is during alright   Do you understand why you were in the hospital? YES   Do you understand the discharge instructions? YES   Where were you discharged to? Home   Items Reviewed:  Medications reviewed: YES  Allergies reviewed: YES  Dietary changes reviewed: YES, heart healthy  Referrals reviewed: YES, will see oncologist 10/04/18   Functional Questionnaire:   Activities of Daily Living (ADLs):   She states he are independent in the following: ambulation, bathing and hygiene, feeding, continence, grooming, toileting and dressing States he doesn't require assistance    Any transportation issues/concerns?: NO   Any patient concerns? NO   Confirmed importance and date/time of follow-up visits scheduled YES, appt 10/01/18  Provider Appointment booked with Dr. Alain Marion  Confirmed with patient if condition begins to worsen call PCP or go to the ER.  Patient was given the office number and encouraged to call back with question or concerns.  : YES

## 2018-09-26 NOTE — Telephone Encounter (Signed)
As per Ms. McKee's notes

## 2018-09-27 ENCOUNTER — Encounter (HOSPITAL_COMMUNITY): Payer: Self-pay

## 2018-09-27 ENCOUNTER — Other Ambulatory Visit (INDEPENDENT_AMBULATORY_CARE_PROVIDER_SITE_OTHER): Payer: Medicare Other

## 2018-09-27 ENCOUNTER — Ambulatory Visit (HOSPITAL_COMMUNITY): Payer: Medicare Other

## 2018-09-27 DIAGNOSIS — K921 Melena: Secondary | ICD-10-CM

## 2018-09-27 LAB — CBC WITH DIFFERENTIAL/PLATELET
Basophils Absolute: 0.1 10*3/uL (ref 0.0–0.1)
Basophils Relative: 1.2 % (ref 0.0–3.0)
Eosinophils Absolute: 0.2 10*3/uL (ref 0.0–0.7)
Eosinophils Relative: 3.4 % (ref 0.0–5.0)
HCT: 28.7 % — ABNORMAL LOW (ref 39.0–52.0)
HEMOGLOBIN: 9.5 g/dL — AB (ref 13.0–17.0)
Lymphocytes Relative: 23.3 % (ref 12.0–46.0)
Lymphs Abs: 1.1 10*3/uL (ref 0.7–4.0)
MCHC: 33.3 g/dL (ref 30.0–36.0)
MCV: 89.4 fl (ref 78.0–100.0)
Monocytes Absolute: 0.6 10*3/uL (ref 0.1–1.0)
Monocytes Relative: 13.2 % — ABNORMAL HIGH (ref 3.0–12.0)
Neutro Abs: 2.7 10*3/uL (ref 1.4–7.7)
Neutrophils Relative %: 58.9 % (ref 43.0–77.0)
Platelets: 317 10*3/uL (ref 150.0–400.0)
RBC: 3.21 Mil/uL — ABNORMAL LOW (ref 4.22–5.81)
RDW: 13.8 % (ref 11.5–15.5)
WBC: 4.7 10*3/uL (ref 4.0–10.5)

## 2018-09-27 NOTE — Progress Notes (Signed)
I think he needs to restart the Eliquis and watch his stools carefully - I am ccing Dr. Ardis Hughs who is on call and his primary GI also

## 2018-09-27 NOTE — Progress Notes (Signed)
Blood count is better What color are his bowel movements? And how many?

## 2018-10-01 ENCOUNTER — Ambulatory Visit: Payer: Medicare Other | Admitting: Hematology

## 2018-10-01 ENCOUNTER — Encounter: Payer: Self-pay | Admitting: Internal Medicine

## 2018-10-01 ENCOUNTER — Ambulatory Visit: Payer: Medicare Other | Admitting: Internal Medicine

## 2018-10-01 VITALS — BP 124/76 | HR 69 | Temp 98.8°F | Ht 65.0 in | Wt 147.0 lb

## 2018-10-01 DIAGNOSIS — I10 Essential (primary) hypertension: Secondary | ICD-10-CM | POA: Diagnosis not present

## 2018-10-01 DIAGNOSIS — C787 Secondary malignant neoplasm of liver and intrahepatic bile duct: Secondary | ICD-10-CM

## 2018-10-01 DIAGNOSIS — R109 Unspecified abdominal pain: Secondary | ICD-10-CM

## 2018-10-01 DIAGNOSIS — R110 Nausea: Secondary | ICD-10-CM | POA: Diagnosis not present

## 2018-10-01 DIAGNOSIS — I2699 Other pulmonary embolism without acute cor pulmonale: Secondary | ICD-10-CM | POA: Diagnosis not present

## 2018-10-01 DIAGNOSIS — Z87898 Personal history of other specified conditions: Secondary | ICD-10-CM

## 2018-10-01 MED ORDER — APIXABAN 5 MG PO TABS: 5.0000 mg | ORAL_TABLET | Freq: Two times a day (BID) | ORAL | 11 refills | Status: DC

## 2018-10-01 MED ORDER — ONDANSETRON HCL 4 MG PO TABS: 4.0000 mg | ORAL_TABLET | Freq: Three times a day (TID) | ORAL | 1 refills | Status: DC | PRN

## 2018-10-01 NOTE — Assessment & Plan Note (Addendum)
Promethazine - d/c Start Zofran prn

## 2018-10-01 NOTE — Assessment & Plan Note (Signed)
Resolved

## 2018-10-01 NOTE — Assessment & Plan Note (Signed)
Gastric cancer Appt w/Dr Magrinat

## 2018-10-01 NOTE — Assessment & Plan Note (Signed)
Norvasc, Benazepril 

## 2018-10-01 NOTE — Assessment & Plan Note (Signed)
on Eliquis 

## 2018-10-01 NOTE — Progress Notes (Signed)
Subjective:  Patient ID: Peter Wood, male    DOB: 10-11-1934  Age: 84 y.o. MRN: [id number]  CC: No chief complaint on file.   HPI Peter Wood presents for a post-hosp f/u - gastric cancer and PE. No CP, cough, abd pain. CBC 4 d ago - anemia.  Per hx: "Discharge Condition:Stable CODE STATUS:FULL Diet recommendation: Heart Healthy   Brief/Interim Summary:  Peter Wood an 84 y.o.malewith a history of prostate CA s/p XRT 2015 and HTN who presented to the ED due to incidental finding of PE on CT chest. He's been undergoing work up for multiple hepatic lesions found on CT 09/07/2018 for evaluation of abdominal pain and 25lbs weight loss. After following up with GI 1/8 he had a staging CT chest which revealed a largely asymptomatic pulmonary embolism. He was told to present to the ED and was subsequently admitted on heparin infusion, made NPO and had EGD performed 1/10 showing a giant non-bleeding gastric ulcer which was biopsied. Heparin infusionwas continued in case of the need for IR Biopsy. Pathology results are now showing gastric adenocarcinoma, with hepatic involvement is stage IV. Hemoglobin  trended downward, and with a high risk of bleeding from malignant gastric ulcer.Hb this morning is 8.4.  Discussed with GI this morning.  We will start him on Eliquis for PE.  He is hemodynamically stable for discharge today to home. He will follow-up with oncology and GI as an outpatient.   Following problems were addressed during his hospitalization:  Acute pulmonary embolus:Negative LE dopplers. Echo 1/10 showed mildly dilated RV with normal systolic function, mild pulmonary HTN, LVEF 55-60%, no WMA, no mention of diastolic dysfunction.Most likely associated with malignancy. Started on Eliquis.   Gastric ulcer, metastatic gastric adenocarcinoma:Found on EGD 09/20/2018, abnormalityinitiallynoted on CT with hepatic metastases. CA 19-9 (67,479) and CEA (1,319.2)  markedly elevated. AFP (2.9) and PSA (0.54) wnl.  Continue PPI. Discussed with oncology, Dr. Alvy Bimler who feels no further work up is currently indicated. Patient has follow up scheduled with Dr. Jana Hakim 1/24.  Anemia of chronic disease: Suspected, due to malignancy. Normocytic with normal ferritin, iron 38, TIBC 251 (LLN 250) and 15% tsat.Previous baseline was normal and has decreased ~2g/dl in 2 days since initiating heparin.  Hb this morning in 8.4.  No bloody bowel movements.  Fever: Resolved  History of prostate CA: No LUTS and negative PSA (0.54)  HTN:  Continue norvasc, losartan   Discharge Diagnoses:  Principal Problem:   Pulmonary emboli ([place]) Active Problems:   Essential hypertension   Weight loss   Liver metastases ([facility])   Abdominal neoplasm without bowel obstruction   Nausea   Abdominal pain   Acute pulmonary embolus [facility])    Discharge Instructions      Discharge Instructions    Diet - low sodium heart healthy   Complete by:  As directed    Discharge instructions   Complete by:  As directed    1) Follow up with your PCP in a week.Do a CBC test to check your hemoglobin during the follow up. 2)Follow up with oncology and gastroenterology as an outpatient. 3)Take prescribed medications as instructed.   Increase activity slowly   Complete by:  As directed   "     Outpatient Medications Prior to Visit  Medication Sig Dispense Refill  . amLODipine (NORVASC) 5 MG tablet Take 1 tablet (5 mg total) daily by mouth. 90 tablet 3  . benazepril (LOTENSIN) 40 MG tablet TAKE ONE TABLET BY  MOUTH ONE TIME DAILY  (Patient taking differently: Take 40 mg by mouth daily. ) 90 tablet 1  . Cholecalciferol 1000 UNITS tablet Take 1,000 Units by mouth daily.     Marland Kitchen ELIQUIS STARTER PACK (ELIQUIS STARTER PACK) 5 MG TABS Take as directed on package: start with two-5mg  tablets twice daily for 7 days. On day 8, switch to one-5mg  tablet twice daily. 1 each 0  .  latanoprost (XALATAN) 0.005 % ophthalmic solution Place 1 drop into both eyes at bedtime.     . naproxen sodium (ALEVE) 220 MG tablet Take 220-440 mg by mouth 2 (two) times daily as needed (for pain).    . pantoprazole (PROTONIX) 40 MG tablet Take 1 tablet (40 mg total) by mouth 2 (two) times daily before a meal. 60 tablet 0  . oxyCODONE (ROXICODONE) 5 MG immediate release tablet Take 1 tablet (5 mg total) by mouth every 4 (four) hours as needed for severe pain. (Patient not taking: Reported on 09/19/2018) 20 tablet 0  . promethazine-codeine (PHENERGAN WITH CODEINE) 6.25-10 MG/5ML syrup Take 5 mLs by mouth every 4 (four) hours as needed. (Patient not taking: Reported on 10/01/2018) 300 mL 0   No facility-administered medications prior to visit.     ROS: Review of Systems  Constitutional: Negative for appetite change, fatigue and unexpected weight change.  HENT: Negative for congestion, nosebleeds, sneezing, sore throat and trouble swallowing.   Eyes: Negative for itching and visual disturbance.  Respiratory: Negative for cough.   Cardiovascular: Negative for chest pain, palpitations and leg swelling.  Gastrointestinal: Negative for abdominal distention, blood in stool, diarrhea and nausea.  Genitourinary: Negative for frequency and hematuria.  Musculoskeletal: Negative for back pain, gait problem, joint swelling and neck pain.  Skin: Negative for rash.  Neurological: Negative for dizziness, tremors, speech difficulty and weakness.  Psychiatric/Behavioral: Negative for agitation, dysphoric mood, sleep disturbance and suicidal ideas. The patient is not nervous/anxious.     Objective:  BP 124/76 (BP Location: Right Arm, Patient Position: Sitting, Cuff Size: Normal)   Pulse 69   Temp 98.8 F (37.1 C) (Oral)   Ht 5\' 5"  (1.651 m)   Wt 147 lb (66.7 kg)   SpO2 99%   BMI 24.46 kg/m   BP Readings from Last 3 Encounters:  10/01/18 124/76  09/25/18 110/64  09/18/18 112/64    Wt Readings  from Last 3 Encounters:  10/01/18 147 lb (66.7 kg)  09/25/18 141 lb 8 oz (64.2 kg)  09/18/18 151 lb 6.4 oz (68.7 kg)    Physical Exam Constitutional:      General: He is not in acute distress.    Appearance: He is well-developed.     Comments: NAD  Eyes:     Conjunctiva/sclera: Conjunctivae normal.     Pupils: Pupils are equal, round, and reactive to light.  Neck:     Musculoskeletal: Normal range of motion.     Thyroid: No thyromegaly.     Vascular: No JVD.  Cardiovascular:     Rate and Rhythm: Normal rate and regular rhythm.     Heart sounds: Normal heart sounds. No murmur. No friction rub. No gallop.   Pulmonary:     Effort: Pulmonary effort is normal. No respiratory distress.     Breath sounds: Normal breath sounds. No wheezing or rales.  Chest:     Chest wall: No tenderness.  Abdominal:     General: Bowel sounds are normal. There is no distension.     Palpations: Abdomen is  soft. There is no mass.     Tenderness: There is no abdominal tenderness. There is no guarding or rebound.  Musculoskeletal: Normal range of motion.        General: No tenderness.  Lymphadenopathy:     Cervical: No cervical adenopathy.  Skin:    General: Skin is warm and dry.     Findings: No rash.  Neurological:     Mental Status: He is alert and oriented to person, place, and time.     Cranial Nerves: No cranial nerve deficit.     Motor: No abnormal muscle tone.     Coordination: Coordination normal.     Gait: Gait normal.     Deep Tendon Reflexes: Reflexes are normal and symmetric.  Psychiatric:        Behavior: Behavior normal.        Thought Content: Thought content normal.        Judgment: Judgment normal.   epig area is tender  Lab Results  Component Value Date   WBC 4.7 09/27/2018   HGB 9.5 (L) 09/27/2018   HCT 28.7 (L) 09/27/2018   PLT 317.0 09/27/2018   GLUCOSE 100 (H) 09/24/2018   CHOL 193 01/18/2018   TRIG 84.0 01/18/2018   HDL 50.50 01/18/2018   LDLDIRECT 126.3  09/23/2007   LDLCALC 126 (H) 01/18/2018   ALT 59 (H) 09/24/2018   AST 61 (H) 09/24/2018   NA 133 (L) 09/24/2018   K 3.8 09/24/2018   CL 100 09/24/2018   CREATININE 0.82 09/24/2018   BUN 18 09/24/2018   CO2 25 09/24/2018   TSH 0.93 01/18/2018   PSA 0.54 09/18/2018   INR 1.33 09/19/2018    Ct Chest W Contrast  Result Date: 09/19/2018 CLINICAL DATA:  Abnormal CT abdomen demonstrating hepatic metastases EXAM: CT CHEST WITH CONTRAST TECHNIQUE: Multidetector CT imaging of the chest was performed during intravenous contrast administration. Sagittal and coronal MPR images reconstructed from axial data set. CONTRAST:  64mL ISOVUE-300 IOPAMIDOL (ISOVUE-300) INJECTION 61% IV COMPARISON:  CT abdomen and pelvis 09/07/2018 FINDINGS: Cardiovascular: Minimal atherosclerotic calcification aorta. Aorta normal caliber without aneurysm or dissection. Filling defects identified in RIGHT lower lobe pulmonary arteries consistent with pulmonary emboli. Remaining pulmonary arteries patent. Small pericardial effusion. Mediastinum/Nodes: Calcified mediastinal and RIGHT hilar lymph nodes. 2.5 x 1.8 cm diameter LEFT thyroid mass, low-attenuation. Base of cervical region otherwise unremarkable. Enlarged subcarinal lymph node 14 mm short axis image 75. No definite esophageal abnormalities. Lungs/Pleura: Minimal dependent atelectasis posterior RIGHT lower lobe. Questionable areas of scarring at lateral RIGHT middle lobe and at medial LEFT lower lobe. Calcified granulomata RIGHT upper lobe image 42 and LEFT upper lobe image 67. Noncalcified 4 mm RIGHT upper lobe nodule image 74. No acute infiltrate, pleural effusion or pneumothorax. Upper Abdomen: Numerous poorly defined hepatic masses compatible with widespread metastatic disease. Enlarged gastrohepatic ligament lymph node 15 mm short axis image 138. Questionable wall thickening of the proximal stomach versus artifact related underdistention; stomach had a similar appearance on the  previous exam favor wall thickening, cannot exclude neoplasm. Probable enlarged periportal lymph nodes. Musculoskeletal: No osseous metastatic lesions. IMPRESSION: Hepatic metastatic disease. Enlarged gastrohepatic ligament, periportal, and subcarinal lymph nodes/adenopathy. Gastric wall thickening suspicious for gastric neoplasm extending to the gastroesophageal junction, recommend endoscopy. Old granulomatous disease within chest. Filling defects in RIGHT lower lobe pulmonary arteries consistent with pulmonary embolism. Small pericardial effusion. Low attenuation 2.4 cm diameter mass LEFT thyroid lobe, question cystic versus solid; follow-up thyroid sonography recommended. Aortic Atherosclerosis (ICD10-I70.0).  Critical Value/emergent results were called by telephone at the time of interpretation on 09/19/2018 at 9:57 am to Tye Savoy NP, who verbally acknowledged these results. Electronically Signed   By: Lavonia Dana M.D.   On: 09/19/2018 10:00   Vas [place] Lower Extremity Venous (dvt)  Result Date: 09/20/2018  Lower Venous Study Indications: Pulmonary embolism.  Performing Technologist: Oliver Hum RVT  Examination Guidelines: A complete evaluation includes B-mode imaging, spectral Doppler, color Doppler, and power Doppler as needed of all accessible portions of each vessel. Bilateral testing is considered an integral part of a complete examination. Limited examinations for reoccurring indications may be performed as noted.  Right Venous Findings: +---------+---------------+---------+-----------+----------+-------+          CompressibilityPhasicitySpontaneityPropertiesSummary +---------+---------------+---------+-----------+----------+-------+ CFV      Full           Yes      Yes                          +---------+---------------+---------+-----------+----------+-------+ SFJ      Full                                                  +---------+---------------+---------+-----------+----------+-------+ FV Prox  Full                                                 +---------+---------------+---------+-----------+----------+-------+ FV Mid   Full                                                 +---------+---------------+---------+-----------+----------+-------+ FV DistalFull                                                 +---------+---------------+---------+-----------+----------+-------+ PFV      Full                                                 +---------+---------------+---------+-----------+----------+-------+ POP      Full           Yes      Yes                          +---------+---------------+---------+-----------+----------+-------+ PTV      Full                                                 +---------+---------------+---------+-----------+----------+-------+ PERO     Full                                                 +---------+---------------+---------+-----------+----------+-------+  Left Venous Findings: +---------+---------------+---------+-----------+----------+-------+          CompressibilityPhasicitySpontaneityPropertiesSummary +---------+---------------+---------+-----------+----------+-------+ CFV      Full           Yes      Yes                          +---------+---------------+---------+-----------+----------+-------+ SFJ      Full                                                 +---------+---------------+---------+-----------+----------+-------+ FV Prox  Full                                                 +---------+---------------+---------+-----------+----------+-------+ FV Mid   Full                                                 +---------+---------------+---------+-----------+----------+-------+ FV DistalFull                                                  +---------+---------------+---------+-----------+----------+-------+ PFV      Full                                                 +---------+---------------+---------+-----------+----------+-------+ POP      Full           Yes      Yes                          +---------+---------------+---------+-----------+----------+-------+ PTV      Full                                                 +---------+---------------+---------+-----------+----------+-------+ PERO     Full                                                 +---------+---------------+---------+-----------+----------+-------+    Summary: Right: There is no evidence of deep vein thrombosis in the lower extremity. No cystic structure found in the popliteal fossa. Left: There is no evidence of deep vein thrombosis in the lower extremity. No cystic structure found in the popliteal fossa.  *See table(s) above for measurements and observations. Electronically signed by Monica Martinez MD on 09/20/2018 at 12:29:36 PM.    Final     Assessment & Plan:   There are no diagnoses linked to this encounter.   No orders of the defined types were placed in this encounter.    Follow-up:  No follow-ups on file.  Walker Kehr, MD

## 2018-10-03 DIAGNOSIS — I700 Atherosclerosis of aorta: Secondary | ICD-10-CM | POA: Insufficient documentation

## 2018-10-03 NOTE — Progress Notes (Signed)
[name]  [phone] [phone]     ID: [name] DOB: [birth year]  MR#: [id number]  HLK#:[id number]  Patient Care Team: Cassandria Anger, MD as PCP - General Milus Banister, MD as Attending Physician (Gastroenterology) Leigha Olberding, Virgie Dad, MD as Consulting Physician (Oncology) Gatha Mayer, MD as Consulting Physician (Gastroenterology) [name], MD as Consulting Physician (Ophthalmology) Ramon Dredge, MD as Referring Physician (Radiation Oncology) OTHER MD:   CHIEF COMPLAINT: Metastatic pancreatic adenocarcinoma  CURRENT TREATMENT: Gemcitabine, liposomal paclitaxel (Abraxane)   HISTORY OF CURRENT ILLNESS: Peter Wood presented to the emergency room 09/07/2018 with mid/upper abdominal pain.  He reported weight loss of 25 pounds over the past 6 months and significant anorexia.  His liver function tests were abnormal and a right upper quadrant ultrasound was obtained.  This was read as concerning for malignancy and a CT of the abdomen and pelvis with contrast was obtained the same day, showing  1. Too numerous to count hypodense masses of the liver concerning for hepatic metastasis. In the region of the porta hepatis, there are hypodense lesions more likely associated with the liver though the pancreatic head is partially obscured by a 3.2 cm hypodense mass. Findings are more likely associated with the liver and less likely an isolated pancreatic mass. 2. Upper abdominal mesenteric and peripancreatic lymphadenopathy suspicious for metastatic disease. 3. 6 mm sclerotic density in the left iliac bone adjacent to the SI joint. Given history of prostate cancer, differential considerations would include osteoblastic metastasis or possibly bone island. Showed only diverticular disease.  He was referred to GI and Dr. Ardis Hughs notes a colonoscopy performed 04/23/2017 makes the possibility of colon cancer unlikely.  Further  work-up was planned, but on 09/19/2018 the patient again presented to the emergency room with chest pain, now in a different area, and a staging CT scan of the chest showed filling defects in the right lower lobe pulmonary arteries consistent with embolism.  There was a small pericardial effusion and an additional 2.4 cm left thyroid mass.  Aortic atherosclerosis was noted  He was started on intravenous heparin and on 09/20/2018 underwent upper endoscopy under Dr. Carlean Purl showing a giant nonbleeding cratered gastric ulcer in the posterior wall of the stomach.  Biopsies of this procedure showed (SZA 20-187) adenocarcinoma.    At that point we were consulted and we obtained tumor markers which included a CEA of 1318.2, a CA 19-9 of 67,479, a normal AFP and a normal PSA.  I discussed the case with pathology and they do not feel that additional testing would be helpful in terms of discriminating between a primary gastric and a primary biliary/pancreatic tumor.  The patient's subsequent history is as detailed below.   INTERVAL HISTORY: [name] was evaluated in the multidisciplinary breast cancer clinic on 10/04/2018 accompanied by his wife, Peter Wood.    REVIEW OF SYSTEMS: Peter Wood states that he alternates between abdominal soreness and nausea. He takes Zofran before he eats to help mitigate the nausea. He has noticed that he gets full quickly while eating and rarely finished his food; he has lost about 20 lbs. He drinks primarily water. His urine is mostly clear, but sometimes it is orange. He takes Aleve for his pain. He has had no bruising or nose bleeds. He has bowel movements almost every day. He has no back pain. Yesterday (10/03/2018), he woke up around 1:00 pm and went to bed around 10:30 pm. During the day, he ran some errands,  then came back home to eat a sandwich and some soup. While completing errands, sometimes he felt kind of weak, but he did not feel like he would pass out. He has noticed that  his stomach has been "gurgling."   He last saw Dr. Ramon Dredge at [facility] for his prostate cancer in 05/2018. He received radiation.   The patient denies unusual headaches, visual changes, vomiting, stiff neck, dizziness, or gait imbalance. There has been no cough, phlegm production, or pleurisy, no chest pain or pressure. The patient denies fever or rash. A detailed review of systems was otherwise entirely negative.   PAST MEDICAL HISTORY: Past Medical History:  Diagnosis Date  . Arthritis   . Diverticulosis of colon   . Family history of breast cancer   . Family history of prostate cancer   . HTN (hypertension)   . Hx of colonic polyp   . Pneumonia    as a child/baby  . Poor circulation of extremity    right leg  . Prostate cancer ([place]) 2015   prostate cancer - radiation treated     PAST SURGICAL HISTORY: Past Surgical History:  Procedure Laterality Date  . [place] VITRECTOMY WITH 20 GAUGE MVR PORT FOR MACULAR HOLE Right 09/19/2016   Procedure: 25 GAUGE PARS PLANA VITRECTOMY WITH 20 GAUGE MVR PORT FOR MACULAR HOLE, MEMBRANE PEEL, SERUM PATCH, GAS FLUID EXCHANGE, HEADSCOPE LASER;  Surgeon: [name], MD;  Location: [facility];  Service: Ophthalmology;  Laterality: Right;  . BIOPSY  09/20/2018   Procedure: BIOPSY;  Surgeon: Gatha Mayer, MD;  Location: [facility] ENDOSCOPY;  Service: Endoscopy;;  . CATARACT EXTRACTION  08/27/12   Right  . COLONOSCOPY    . ESOPHAGOGASTRODUODENOSCOPY (EGD) WITH PROPOFOL N/A 09/20/2018   Procedure: ESOPHAGOGASTRODUODENOSCOPY (EGD) WITH PROPOFOL;  Surgeon: Gatha Mayer, MD;  Location: [facility];  Service: Endoscopy;  Laterality: N/A;  . HAND SURGERY  2016   right thumb  . KNEE SURGERY Left 1989     FAMILY HISTORY: Family History  Problem Relation Age of Onset  . Prostate cancer Father        dx >50  . Hypertension Mother   . Breast cancer Sister        dx 20's  . Prostate cancer Brother        dx under 6, metasttic,  cause of his death  . Ulcers Brother   . Ulcers Sister    Barack's father died from heart complications with a pacemaker at age 14; Kairen's father also has prostate cancer. Patients' mother died from natural causes at age 26. The patient has 4 brothers and 3 sisters. One of [place] sisters had breast cancer in her 6s and a brother had prostate cancer. Patient denies anyone in her family having ovarian, or pancreatic cancer. Lon mentions that his youngest sister and his brother with prostate cancer also had stomach ulcers.    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 His wife, Peter Wood, is a retired [employer] summer Radiographer, therapeutic. As of 09/2018, they have been married for 56 years. They have 4 children, Claiborne Billings, Diablo Grande, [place], and [place]. Claiborne Billings lives in [place] and works at the [employer]. Sherrod lives in [place] and works at [employer] and as a part Horticulturist, commercial. Germane lives in [place] and is a Engineer, drilling for a medical company. Beverely Low lives in [place] and works at [employer]. Masaru has 7 grandchildren and 1 step great grandchild. He attends  [employer].    ADVANCED DIRECTIVES: His wife, Peter Wood, is automatically his healthcare power of attorney.     HEALTH MAINTENANCE: Social History   Tobacco Use  . Smoking status: Former Smoker    Years: 4.00    Last attempt to quit: 09/12/1967    Years since quitting: 51.0  . Smokeless tobacco: Never Used  Substance Use Topics  . Alcohol use: No  . Drug use: No    Colonoscopy: 2018/[facility]  PSA: 0.54 on 09/18/2018  No Known Allergies  Current Outpatient Medications  Medication Sig Dispense Refill  . amLODipine (NORVASC) 5 MG tablet Take 1 tablet (5 mg total) daily by mouth. 90 tablet 3  . apixaban (ELIQUIS) 5 MG TABS tablet Take 1 tablet (5 mg total) by mouth 2 (two) times daily. 60 tablet 11  . benazepril (LOTENSIN) 40 MG tablet TAKE ONE TABLET BY MOUTH ONE TIME DAILY  (Patient taking  differently: Take 40 mg by mouth daily. ) 90 tablet 1  . latanoprost (XALATAN) 0.005 % ophthalmic solution Place 1 drop into both eyes at bedtime.     . pantoprazole (PROTONIX) 40 MG tablet Take 1 tablet (40 mg total) by mouth 2 (two) times daily before a meal. 60 tablet 0  . Cholecalciferol 1000 UNITS tablet Take 1,000 Units by mouth daily.     . naproxen sodium (ALEVE) 220 MG tablet Take 220-440 mg by mouth 2 (two) times daily as needed (for pain).    . ondansetron (ZOFRAN) 4 MG tablet Take 1 tablet (4 mg total) by mouth every 8 (eight) hours as needed for nausea or vomiting. (Patient not taking: Reported on 10/04/2018) 20 tablet 1   No current facility-administered medications for this visit.      OBJECTIVE: Older African-American man who appears stated age  40:   10/04/18 1353  BP: 112/64  Pulse: 76  Resp: 14  Temp: 98.7 F (37.1 C)  SpO2: 99%     Body mass index is 23.91 kg/m.   Wt Readings from Last 3 Encounters:  10/04/18 141 lb 8 oz (64.2 kg)  10/01/18 147 lb (66.7 kg)  09/25/18 141 lb 8 oz (64.2 kg)      ECOG FS:2 - Symptomatic, <50% confined to bed  Ocular: Sclerae unicteric, pupils round and equal, bilateral arcus senilis Ear-nose-throat: Oropharynx clear and moist Lymphatic: No cervical or supraclavicular adenopathy Lungs no rales or rhonchi Heart regular rate and rhythm Abd muscular, nontender, positive bowel sounds, no well-defined mass palpated MSK no focal spinal tenderness, no lower extremity edema Neuro: non-focal, well-oriented, appropriate affect  LAB RESULTS:  CMP     Component Value Date/Time   NA 134 (L) 10/04/2018 1510   K 4.2 10/04/2018 1510   CL 99 10/04/2018 1510   CO2 24 10/04/2018 1510   GLUCOSE 76 10/04/2018 1510   GLUCOSE 105 (H) 09/13/2006 0900   BUN 16 10/04/2018 1510   CREATININE 1.03 10/04/2018 1510   CALCIUM 9.0 10/04/2018 1510   PROT 7.2 10/04/2018 1510   ALBUMIN 3.0 (L) 10/04/2018 1510   AST 65 (H) 10/04/2018 1510   ALT  69 (H) 10/04/2018 1510   ALKPHOS 230 (H) 10/04/2018 1510   BILITOT 1.2 10/04/2018 1510   GFRNONAA >60 10/04/2018 1510   GFRAA >60 10/04/2018 1510    No results found for: TOTALPROTELP, ALBUMINELP, A1GS, A2GS, BETS, BETA2SER, GAMS, MSPIKE, SPEI  No results found for: KPAFRELGTCHN, LAMBDASER, KAPLAMBRATIO  Lab Results  Component Value Date   WBC 9.1 10/04/2018  NEUTROABS 6.4 10/04/2018   HGB 9.3 (L) 10/04/2018   HCT 29.2 (L) 10/04/2018   MCV 91.3 10/04/2018   PLT 369 10/04/2018    @LASTCHEMISTRY @  No results found for: LABCA2  No components found for: [id number]  No results for input(s): INR in the last 168 hours.  No results found for: LABCA2  No results found for: WUJ811  No results found for: BJY782  No results found for: NFA213  No results found for: CA2729  No components found for: HGQUANT  No results found for: CEA1 / No results found for: CEA1   No results found for: AFPTUMOR  No results found for: CHROMOGRNA  No results found for: PSA1  Appointment on 10/04/2018  Component Date Value Ref Range Status  . Sodium 10/04/2018 134* 135 - 145 mmol/L Final  . Potassium 10/04/2018 4.2  3.5 - 5.1 mmol/L Final  . Chloride 10/04/2018 99  98 - 111 mmol/L Final  . CO2 10/04/2018 24  22 - 32 mmol/L Final  . Glucose, Bld 10/04/2018 76  70 - 99 mg/dL Final  . BUN 10/04/2018 16  8 - 23 mg/dL Final  . Creatinine 10/04/2018 1.03  0.61 - 1.24 mg/dL Final  . Calcium 10/04/2018 9.0  8.9 - 10.3 mg/dL Final  . Total Protein 10/04/2018 7.2  6.5 - 8.1 g/dL Final  . Albumin 10/04/2018 3.0* 3.5 - 5.0 g/dL Final  . AST 10/04/2018 65* 15 - 41 U/L Final  . ALT 10/04/2018 69* 0 - 44 U/L Final  . Alkaline Phosphatase 10/04/2018 230* 38 - 126 U/L Final  . Total Bilirubin 10/04/2018 1.2  0.3 - 1.2 mg/dL Final  . GFR, Est Non Af Am 10/04/2018 >60  >60 mL/min Final  . GFR, Est AFR Am 10/04/2018 >60  >60 mL/min Final  . Anion gap 10/04/2018 11  5 - 15 Final   Performed at [facility], [place] [address], [place], [place] [address]  . WBC Count 10/04/2018 9.1  4.0 - 10.5 K/uL Final  . RBC 10/04/2018 3.20* 4.22 - 5.81 MIL/uL Final  . Hemoglobin 10/04/2018 9.3* 13.0 - 17.0 g/dL Final  . HCT 10/04/2018 29.2* 39.0 - 52.0 % Final  . MCV 10/04/2018 91.3  80.0 - 100.0 fL Final  . MCH 10/04/2018 29.1  26.0 - 34.0 pg Final  . MCHC 10/04/2018 31.8  30.0 - 36.0 g/dL Final  . RDW 10/04/2018 14.1  11.5 - 15.5 % Final  . Platelet Count 10/04/2018 369  150 - 400 K/uL Final  . nRBC 10/04/2018 0.0  0.0 - 0.2 % Final  . Neutrophils Relative % 10/04/2018 72  % Final  . Neutro Abs 10/04/2018 6.4  1.7 - 7.7 K/uL Final  . Lymphocytes Relative 10/04/2018 18  % Final  . Lymphs Abs 10/04/2018 1.6  0.7 - 4.0 K/uL Final  . Monocytes Relative 10/04/2018 10  % Final  . Monocytes Absolute 10/04/2018 0.9  0.1 - 1.0 K/uL Final  . Eosinophils Relative 10/04/2018 0  % Final  . Eosinophils Absolute 10/04/2018 0.0  0.0 - 0.5 K/uL Final  . Basophils Relative 10/04/2018 0  % Final  . Basophils Absolute 10/04/2018 0.0  0.0 - 0.1 K/uL Final  . Immature Granulocytes 10/04/2018 0  % Final  . Abs Immature Granulocytes 10/04/2018 0.04  0.00 - 0.07 K/uL Final   Performed at [facility], [place] [address] [place], [place] [address]    (this displays the last labs from the last  3 days)  No results found for: TOTALPROTELP, ALBUMINELP, A1GS, A2GS, BETS, BETA2SER, GAMS, MSPIKE, SPEI (this displays SPEP labs)  No results found for: KPAFRELGTCHN, LAMBDASER, KAPLAMBRATIO (kappa/lambda light chains)  No results found for: HGBA, HGBA2QUANT, HGBFQUANT, HGBSQUAN (Hemoglobinopathy evaluation)   No results found for: LDH  Lab Results  Component Value Date   IRON 38 (L) 09/21/2018   TIBC 251 09/21/2018   IRONPCTSAT 15 (L) 09/21/2018   (Iron and TIBC)  Lab Results  Component Value Date   FERRITIN 138 09/21/2018    Urinalysis    Component Value  Date/Time   COLORURINE YELLOW 09/22/2018 [place] 09/22/2018 0541   LABSPEC 1.014 09/22/2018 [place] 6.0 09/22/2018 [place] 09/22/2018 [place] 07/20/2017 [place] 09/22/2018 [place] 09/22/2018 0541   KETONESUR 5 (A) 09/22/2018 0541   PROTEINUR NEGATIVE 09/22/2018 0541   UROBILINOGEN 0.2 07/20/2017 1056   NITRITE NEGATIVE 09/22/2018 0541   LEUKOCYTESUR NEGATIVE 09/22/2018 0541   Results for RUVIM, RISKO (MRN [id number]) as of 10/04/2018 14:06  Ref. Range 09/18/2018 10:58  CA 19-9 Latest Ref Range: <34 U/mL 67,479 (H)  CEA Latest Units: ng/mL 1,318.2 (H)      STUDIES:  Ct Chest W Contrast  Result Date: 09/19/2018 CLINICAL DATA:  Abnormal CT abdomen demonstrating hepatic metastases EXAM: CT CHEST WITH CONTRAST TECHNIQUE: Multidetector CT imaging of the chest was performed during intravenous contrast administration. Sagittal and coronal MPR images reconstructed from axial data set. CONTRAST:  98mL ISOVUE-300 IOPAMIDOL (ISOVUE-300) INJECTION 61% IV COMPARISON:  CT abdomen and pelvis 09/07/2018 FINDINGS: Cardiovascular: Minimal atherosclerotic calcification aorta. Aorta normal caliber without aneurysm or dissection. Filling defects identified in RIGHT lower lobe pulmonary arteries consistent with pulmonary emboli. Remaining pulmonary arteries patent. Small pericardial effusion. Mediastinum/Nodes: Calcified mediastinal and RIGHT hilar lymph nodes. 2.5 x 1.8 cm diameter LEFT thyroid mass, low-attenuation. Base of cervical region otherwise unremarkable. Enlarged subcarinal lymph node 14 mm short axis image 75. No definite esophageal abnormalities. Lungs/Pleura: Minimal dependent atelectasis posterior RIGHT lower lobe. Questionable areas of scarring at lateral RIGHT middle lobe and at medial LEFT lower lobe. Calcified granulomata RIGHT upper lobe image 42 and LEFT upper lobe image 67. Noncalcified 4 mm RIGHT  upper lobe nodule image 74. No acute infiltrate, pleural effusion or pneumothorax. Upper Abdomen: Numerous poorly defined hepatic masses compatible with widespread metastatic disease. Enlarged gastrohepatic ligament lymph node 15 mm short axis image 138. Questionable wall thickening of the proximal stomach versus artifact related underdistention; stomach had a similar appearance on the previous exam favor wall thickening, cannot exclude neoplasm. Probable enlarged periportal lymph nodes. Musculoskeletal: No osseous metastatic lesions. IMPRESSION: Hepatic metastatic disease. Enlarged gastrohepatic ligament, periportal, and subcarinal lymph nodes/adenopathy. Gastric wall thickening suspicious for gastric neoplasm extending to the gastroesophageal junction, recommend endoscopy. Old granulomatous disease within chest. Filling defects in RIGHT lower lobe pulmonary arteries consistent with pulmonary embolism. Small pericardial effusion. Low attenuation 2.4 cm diameter mass LEFT thyroid lobe, question cystic versus solid; follow-up thyroid sonography recommended. Aortic Atherosclerosis (ICD10-I70.0). Critical Value/emergent results were called by telephone at the time of interpretation on 09/19/2018 at 9:57 am to Tye Savoy NP, who verbally acknowledged these results. Electronically Signed   By: Lavonia Dana M.D.   On: 09/19/2018 10:00   Ct Abdomen Pelvis W Contrast  Result Date: 09/07/2018 CLINICAL DATA:  Left lower quadrant pain since 5 a.m. History of prostate cancer without surgery. EXAM: CT  ABDOMEN AND PELVIS WITH CONTRAST TECHNIQUE: Multidetector CT imaging of the abdomen and pelvis was performed using the standard protocol following bolus administration of intravenous contrast. CONTRAST:  138mL ISOVUE-300 IOPAMIDOL (ISOVUE-300) INJECTION 61% COMPARISON:  Same day abdominal ultrasound. FINDINGS: Lower chest: Subpleural areas of fine reticular markings are identified in the periphery of the right middle lobe and  medial left lower lobe suspicious for atelectasis and/or scarring. Hepatobiliary: Too numerous to count hypodense masses of the liver are identified in keeping with findings on earlier same day ultrasound of the abdomen. Findings are concerning for hepatic metastasis. No biliary dilatation. Gallbladder is free of stones. No choledocholithiasis. Pancreas: In the region of the porta hepatis, there are hypodense lesions more likely associated with the liver though the pancreatic head is partially obscured by 3.2 cm hypodense mass. Findings are more likely associated with the liver and less likely an isolated pancreatic mass. No pathologic ductal dilatation of the pancreas is seen. The pancreatic duct as visualized is within normal limits in caliber measuring 1-2 mm. No inflammation of the pancreas is seen. Spleen: Normal Adrenals/Urinary Tract: No adrenal mass. Subcentimeter hypodensities are seen within both kidneys statistically consistent with cysts but are too small to characterize. No hydroureteronephrosis or nephrolithiasis. The urinary bladder is physiologically distended and unremarkable. Stomach/Bowel: Small hiatal hernia with coapted gastroesophageal juncture limiting further assessment in this portion. There is mild thickening of this portion of the stomach as well as distal body and antrum. The duodenal bulb and sweep, small and large intestine are nonacute. Moderate stool retention within the colon is identified without bowel obstruction or inflammation. Vascular/Lymphatic: Minimally atherosclerotic abdominal aorta without aneurysm. Mesenteric nodule in the mid abdomen, series 7/31 may represent a mildly enlarged lymph node up to 17 mm versus partial volume averaging of small bowel. PET-CT may help for further correlation. Peripancreatic 17 mm lymph node posterior to the stomach, series 7/16 is also identified. Reproductive: Brachia therapy seeds are identified within the prostate. No localized soft tissue  involvement. Other: No free air. No free fluid. Degenerative changes are present along the dorsal spine and both hips. No aggressive osseous lesions. Small sclerotic density in the left iliac bone adjacent to the SI joint measuring 6 mm is noted, nonspecific in isolation. Given history of prostate cancer, differential considerations would include osteoblastic metastasis or possibly bone island. Musculoskeletal: IMPRESSION: 1. Too numerous to count hypodense masses of the liver concerning for hepatic metastasis. In the region of the porta hepatis, there are hypodense lesions more likely associated with the liver though the pancreatic head is partially obscured by a 3.2 cm hypodense mass. Findings are more likely associated with the liver and less likely an isolated pancreatic mass. 2. Upper abdominal mesenteric and peripancreatic lymphadenopathy suspicious for metastatic disease. 3. 6 mm sclerotic density in the left iliac bone adjacent to the SI joint. Given history of prostate cancer, differential considerations would include osteoblastic metastasis or possibly bone island. Electronically Signed   By: Ashley Royalty M.D.   On: 09/07/2018 19:10   US Abdomen Limited  Result Date: 09/07/2018 CLINICAL DATA:  Right upper quadrant abdominal pain. EXAM: ULTRASOUND ABDOMEN LIMITED RIGHT UPPER QUADRANT COMPARISON:  None. FINDINGS: Gallbladder: No cholelithiasis. No gallbladder wall thickening. Negative sonographic Murphy sign. Common bile duct: Diameter: 10 mm Liver: Multiple mixed echogenicity masses are demonstrated throughout the liver with the largest measuring up to 4.1 x 2.4 x 3.3 cm in the left hepatic lobe. Within the region of the porta hepatis there is a  5.8 x 2.2 cm mixed echogenicity mass. Portal vein is patent on color Doppler imaging with normal direction of blood flow towards the liver. IMPRESSION: 1. Multiple mixed echogenicity masses throughout the liver concerning for possibility of metastatic disease.  2. Heterogeneous mass within the porta hepatis may represent pancreatic mass or hepatic mass, of uncertain etiology on current examination. Possibility of pancreatic malignancy not excluded. 3. Common bile duct is dilated, potentially secondary to distal obstructing mass. 4. Given the multitude of findings, recommend further evaluation with contrast-enhanced CT or MRI. Electronically Signed   By: Lovey Newcomer M.D.   On: 09/07/2018 16:04   Vas [place] Lower Extremity Venous (dvt)  Result Date: 09/20/2018  Lower Venous Study Indications: Pulmonary embolism.  Performing Technologist: Oliver Hum RVT  Examination Guidelines: A complete evaluation includes B-mode imaging, spectral Doppler, color Doppler, and power Doppler as needed of all accessible portions of each vessel. Bilateral testing is considered an integral part of a complete examination. Limited examinations for reoccurring indications may be performed as noted.  Right Venous Findings: +---------+---------------+---------+-----------+----------+-------+          CompressibilityPhasicitySpontaneityPropertiesSummary +---------+---------------+---------+-----------+----------+-------+ CFV      Full           Yes      Yes                          +---------+---------------+---------+-----------+----------+-------+ SFJ      Full                                                 +---------+---------------+---------+-----------+----------+-------+ FV Prox  Full                                                 +---------+---------------+---------+-----------+----------+-------+ FV Mid   Full                                                 +---------+---------------+---------+-----------+----------+-------+ FV DistalFull                                                 +---------+---------------+---------+-----------+----------+-------+ PFV      Full                                                  +---------+---------------+---------+-----------+----------+-------+ POP      Full           Yes      Yes                          +---------+---------------+---------+-----------+----------+-------+ PTV      Full                                                 +---------+---------------+---------+-----------+----------+-------+  PERO     Full                                                 +---------+---------------+---------+-----------+----------+-------+  Left Venous Findings: +---------+---------------+---------+-----------+----------+-------+          CompressibilityPhasicitySpontaneityPropertiesSummary +---------+---------------+---------+-----------+----------+-------+ CFV      Full           Yes      Yes                          +---------+---------------+---------+-----------+----------+-------+ SFJ      Full                                                 +---------+---------------+---------+-----------+----------+-------+ FV Prox  Full                                                 +---------+---------------+---------+-----------+----------+-------+ FV Mid   Full                                                 +---------+---------------+---------+-----------+----------+-------+ FV DistalFull                                                 +---------+---------------+---------+-----------+----------+-------+ PFV      Full                                                 +---------+---------------+---------+-----------+----------+-------+ POP      Full           Yes      Yes                          +---------+---------------+---------+-----------+----------+-------+ PTV      Full                                                 +---------+---------------+---------+-----------+----------+-------+ PERO     Full                                                  +---------+---------------+---------+-----------+----------+-------+    Summary: Right: There is no evidence of deep vein thrombosis in the lower extremity. No cystic structure found in the popliteal fossa. Left: There is no evidence of deep vein thrombosis in the lower extremity. No cystic structure found in the popliteal fossa.  *  See table(s) above for measurements and observations. Electronically signed by Monica Martinez MD on 09/20/2018 at 12:29:36 PM.    Final      ELIGIBLE FOR AVAILABLE RESEARCH PROTOCOL: no   ASSESSMENT: 84 y.o. [place], [place] man status post gastric biopsy 09/20/2018 showing adenocarcinoma, with a CA 19-9 greater than 65,000; staging studies showing an apparent mass in the head of the pancreas, innumerable liver lesions, upper abdominal mesenteric and peripancreatic lymphadenopathy, and an isolated sclerotic density in the left iliac bone  (a) genomics requested on biopsy material: Results pending  (1) chest CT scan 09/19/2018 shows right lower lobe pulmonary embolism  (a) on intravenous heparin started 09/19/2018, transitioned to apixaban 10/01/2018  (2) genetics testing 10/04/2018: Results pending  (a) family history of prostate and early breast cancer; patient has a history of prostate cancer  (3) to start gemcitabine/Abraxane 10/09/2018, repeated days 1, 8 and 15 of each 28-day cycle   PLAN: I spent approximately 60 minutes face to face with [name] with more than 50% of that time spent in counseling and coordination of care. Specifically we reviewed the biology of the patient's diagnosis and the specifics of her situation.  We first reviewed the fact that cancer is not one disease but more than 100 different diseases and that it is important to keep them separate-- otherwise when friends and relatives discuss their own cancer experiences with [place] confusion can result. Similarly we explained that if pancreatic cancer spreads to the bone or liver, the  patient would not have bone cancer or liver cancer, but pancreatic cancer in the bone and creatinine cancer in the liver: one cancer in three places-- not 3 different cancers which otherwise would have to be treated in 3 different ways.  We discussed the difference between local and systemic therapy.  The patient's wife has been reading up on proton therapy.  She understands this is very useful for isolated lesions but not for disseminated cancer as in this case.  What we need here is systemic therapy.  Would treat all the patient's lesions in the liver, bone, lymph nodes, pancreas, and elsewhere.  We discussed the fact that some patients with pancreatic cancer carry a deleterious genetic mutation or have a mutation in the tumor itself (not germline) which might give [place] additional treatment options.  Mr. Kervin has been tested for both these possibilities.  We should have those results within a month.  In the meantime I recommended we go ahead and start chemotherapy which consists, given his functional status, of gemcitabine and Abraxane.  We discussed the possible toxicity side effects and complications of these agents.  We have gone ahead and placed the orders and will be starting treatment 10/09/2018.  Mrs. Bise also expressed interest in a second opinion at [facility].  She understands we encourage that as we do not have a study available for Mr. Arvie here in [place].  Also discussed advanced directives.  I strongly counseled them against resuscitation in case of a terminal event since they would be worse than useless, might be very traumatic for him and his family.  I asked them to discuss this between themselves and with other family members and at the next visit here I hope to write an out of facility DO NOT RESUSCITATE order for him.  Today we started him on omeprazole given his ulcer as noted on EGD.  Hopefully this will help some of his GI symptoms.  Mr. Ouch has a good understanding  of the  overall plan.  He agrees with it.  He knows the goal of treatment in his case is cure.  He will call with any problems that may develop before his next visit here.   Aashna Matson, Virgie Dad, MD  10/04/18 4:30 PM Medical Oncology and Hematology [facility] [address] [place], [place] [address] Tel. [phone]    Fax. [phone]    I, Jacqualyn Posey am acting as a Education administrator for Chauncey Cruel, MD.   I, Lurline Del MD, have reviewed the above documentation for accuracy and completeness, and I agree with the above.

## 2018-10-04 ENCOUNTER — Telehealth: Payer: Self-pay | Admitting: Oncology

## 2018-10-04 ENCOUNTER — Other Ambulatory Visit: Payer: Self-pay

## 2018-10-04 ENCOUNTER — Inpatient Hospital Stay (HOSPITAL_BASED_OUTPATIENT_CLINIC_OR_DEPARTMENT_OTHER): Payer: Medicare Other | Admitting: Genetics

## 2018-10-04 ENCOUNTER — Inpatient Hospital Stay: Payer: Medicare Other

## 2018-10-04 ENCOUNTER — Encounter: Payer: Self-pay | Admitting: Genetics

## 2018-10-04 ENCOUNTER — Inpatient Hospital Stay: Payer: Medicare Other | Attending: Hematology | Admitting: Oncology

## 2018-10-04 VITALS — BP 112/64 | HR 76 | Temp 98.7°F | Resp 14 | Ht 64.5 in | Wt 141.5 lb

## 2018-10-04 DIAGNOSIS — C799 Secondary malignant neoplasm of unspecified site: Secondary | ICD-10-CM

## 2018-10-04 DIAGNOSIS — Z5111 Encounter for antineoplastic chemotherapy: Secondary | ICD-10-CM | POA: Insufficient documentation

## 2018-10-04 DIAGNOSIS — C61 Malignant neoplasm of prostate: Secondary | ICD-10-CM | POA: Diagnosis not present

## 2018-10-04 DIAGNOSIS — Z8042 Family history of malignant neoplasm of prostate: Secondary | ICD-10-CM | POA: Diagnosis not present

## 2018-10-04 DIAGNOSIS — Z7901 Long term (current) use of anticoagulants: Secondary | ICD-10-CM | POA: Diagnosis not present

## 2018-10-04 DIAGNOSIS — C787 Secondary malignant neoplasm of liver and intrahepatic bile duct: Secondary | ICD-10-CM | POA: Insufficient documentation

## 2018-10-04 DIAGNOSIS — C257 Malignant neoplasm of other parts of pancreas: Secondary | ICD-10-CM

## 2018-10-04 DIAGNOSIS — C169 Malignant neoplasm of stomach, unspecified: Secondary | ICD-10-CM | POA: Diagnosis not present

## 2018-10-04 DIAGNOSIS — I700 Atherosclerosis of aorta: Secondary | ICD-10-CM

## 2018-10-04 DIAGNOSIS — Z8546 Personal history of malignant neoplasm of prostate: Secondary | ICD-10-CM | POA: Diagnosis not present

## 2018-10-04 DIAGNOSIS — Z79899 Other long term (current) drug therapy: Secondary | ICD-10-CM

## 2018-10-04 DIAGNOSIS — C7951 Secondary malignant neoplasm of bone: Secondary | ICD-10-CM

## 2018-10-04 DIAGNOSIS — Z87891 Personal history of nicotine dependence: Secondary | ICD-10-CM | POA: Insufficient documentation

## 2018-10-04 DIAGNOSIS — C250 Malignant neoplasm of head of pancreas: Secondary | ICD-10-CM | POA: Insufficient documentation

## 2018-10-04 DIAGNOSIS — Z803 Family history of malignant neoplasm of breast: Secondary | ICD-10-CM

## 2018-10-04 DIAGNOSIS — I2699 Other pulmonary embolism without acute cor pulmonale: Secondary | ICD-10-CM

## 2018-10-04 DIAGNOSIS — C259 Malignant neoplasm of pancreas, unspecified: Secondary | ICD-10-CM | POA: Insufficient documentation

## 2018-10-04 LAB — CBC WITH DIFFERENTIAL (CANCER CENTER ONLY)
Abs Immature Granulocytes: 0.04 10*3/uL (ref 0.00–0.07)
Basophils Absolute: 0 10*3/uL (ref 0.0–0.1)
Basophils Relative: 0 %
Eosinophils Absolute: 0 10*3/uL (ref 0.0–0.5)
Eosinophils Relative: 0 %
HCT: 29.2 % — ABNORMAL LOW (ref 39.0–52.0)
Hemoglobin: 9.3 g/dL — ABNORMAL LOW (ref 13.0–17.0)
Immature Granulocytes: 0 %
Lymphocytes Relative: 18 %
Lymphs Abs: 1.6 10*3/uL (ref 0.7–4.0)
MCH: 29.1 pg (ref 26.0–34.0)
MCHC: 31.8 g/dL (ref 30.0–36.0)
MCV: 91.3 fL (ref 80.0–100.0)
MONOS PCT: 10 %
Monocytes Absolute: 0.9 10*3/uL (ref 0.1–1.0)
Neutro Abs: 6.4 10*3/uL (ref 1.7–7.7)
Neutrophils Relative %: 72 %
Platelet Count: 369 10*3/uL (ref 150–400)
RBC: 3.2 MIL/uL — ABNORMAL LOW (ref 4.22–5.81)
RDW: 14.1 % (ref 11.5–15.5)
WBC Count: 9.1 10*3/uL (ref 4.0–10.5)
nRBC: 0 % (ref 0.0–0.2)

## 2018-10-04 LAB — CMP (CANCER CENTER ONLY)
ALT: 69 U/L — ABNORMAL HIGH (ref 0–44)
AST: 65 U/L — ABNORMAL HIGH (ref 15–41)
Albumin: 3 g/dL — ABNORMAL LOW (ref 3.5–5.0)
Alkaline Phosphatase: 230 U/L — ABNORMAL HIGH (ref 38–126)
Anion gap: 11 (ref 5–15)
BILIRUBIN TOTAL: 1.2 mg/dL (ref 0.3–1.2)
BUN: 16 mg/dL (ref 8–23)
CO2: 24 mmol/L (ref 22–32)
CREATININE: 1.03 mg/dL (ref 0.61–1.24)
Calcium: 9 mg/dL (ref 8.9–10.3)
Chloride: 99 mmol/L (ref 98–111)
GFR, Est AFR Am: >60 mL/min (ref >60)
GFR, Estimated: >60 mL/min (ref >60)
Glucose, Bld: 76 mg/dL (ref 70–99)
Potassium: 4.2 mmol/L (ref 3.5–5.1)
Sodium: 134 mmol/L — ABNORMAL LOW (ref 135–145)
Total Protein: 7.2 g/dL (ref 6.5–8.1)

## 2018-10-04 MED ORDER — PROCHLORPERAZINE MALEATE 10 MG PO TABS: 10.0000 mg | ORAL_TABLET | Freq: Four times a day (QID) | ORAL | 1 refills | Status: DC | PRN

## 2018-10-04 MED ORDER — OMEPRAZOLE 40 MG PO CPDR: 40.0000 mg | DELAYED_RELEASE_CAPSULE | Freq: Every day | ORAL | 3 refills | Status: DC

## 2018-10-04 NOTE — Telephone Encounter (Signed)
Gave avs and calendar nurse will call about next week

## 2018-10-04 NOTE — Progress Notes (Signed)
START ON PATHWAY REGIMEN - Pancreatic     A cycle is every 28 days:     Nab-paclitaxel (protein bound)      Gemcitabine   **Always confirm dose/schedule in your pharmacy ordering system**  Patient Characteristics: Adenocarcinoma, Metastatic Disease, First Line, PS  ?  2, BRCA1/2 and PALB2 Mutation Absent/Unknown Histology: Adenocarcinoma Current evidence of distant metastases<= Yes AJCC T Category: TX AJCC N Category: NX AJCC M Category: M1 AJCC 8 Stage Grouping: IV Line of Therapy: First Line BRCA1/2 Mutation Status: Awaiting Test Results PALB2 Mutation Status: Awaiting Test Results Intent of Therapy: Non-Curative / Palliative Intent, Discussed with Patient 

## 2018-10-04 NOTE — Progress Notes (Signed)
REFERRING PROVIDER: Chauncey Cruel, MD [address] [place], [place] [address]  PRIMARY PROVIDER:  Plotnikov, Evie Lacks, MD  PRIMARY REASON FOR VISIT:  1. Malignant neoplasm of other parts of pancreas ([place])   2. Family history of prostate cancer   3. Family history of breast cancer   4. Prostate cancer ([place])    HISTORY OF PRESENT ILLNESS:   Mr. Yale, a 84 y.o. male, was seen for a [place] cancer genetics consultation at the request of Dr. Jana Hakim due to a personal and family history of cancer.  Mr. Rehfeldt presents to clinic today to discuss the possibility of a hereditary predisposition to cancer, genetic testing, and to further clarify his future cancer risks, as well as potential cancer risks for family members.   In 2015, at the age of 29, Mr. Pitter was diagnosed with Prostate cancer. He reports having radiation treatment.   Recently he has been diagnosed with metastatic adenocarcinoma, thought to be pancreatic/billiary origin cancer per his oncologist Dr. Jana Hakim.   CANCER HISTORY:    Liver metastases ([place])   09/19/2018 Initial Diagnosis    Liver metastases ([place])    10/07/2018 -  Chemotherapy    The patient had GEMCITABINE CHEMO IV INFUSION ORDERABLE, 1,000 mg/m2, Intravenous,  Once, 0 of 4 cycles PACLITAXEL PROTEIN-BOUND CHEMO INFUSION, 125 mg/m2, Intravenous, Once, 0 of 4 cycles  for chemotherapy treatment.       Colonoscopy: yes; 2013. A few Tubular adenomas.   Past Medical History:  Diagnosis Date  . Arthritis   . Diverticulosis of colon   . Family history of breast cancer   . Family history of prostate cancer   . HTN (hypertension)   . Hx of colonic polyp   . Pneumonia    as a child/baby  . Poor circulation of extremity    right leg  . Prostate cancer [facility]) 2015   prostate cancer - radiation treated    Past Surgical History:  Procedure Laterality Date  . [place] VITRECTOMY WITH 20 GAUGE MVR PORT FOR MACULAR HOLE Right  09/19/2016   Procedure: 25 GAUGE PARS PLANA VITRECTOMY WITH 20 GAUGE MVR PORT FOR MACULAR HOLE, MEMBRANE PEEL, SERUM PATCH, GAS FLUID EXCHANGE, HEADSCOPE LASER;  Surgeon: [name], MD;  Location: [facility];  Service: Ophthalmology;  Laterality: Right;  . BIOPSY  09/20/2018   Procedure: BIOPSY;  Surgeon: Gatha Mayer, MD;  Location: [facility] ENDOSCOPY;  Service: Endoscopy;;  . CATARACT EXTRACTION  08/27/12   Right  . COLONOSCOPY    . ESOPHAGOGASTRODUODENOSCOPY (EGD) WITH PROPOFOL N/A 09/20/2018   Procedure: ESOPHAGOGASTRODUODENOSCOPY (EGD) WITH PROPOFOL;  Surgeon: Gatha Mayer, MD;  Location: [facility];  Service: Endoscopy;  Laterality: N/A;  . HAND SURGERY  2016   right thumb  . KNEE SURGERY Left 1989    Social History   Socioeconomic History  . Marital status: Married    Spouse name: Not on file  . Number of children: 4  . Years of education: Not on file  . Highest education level: Not on file  Occupational History  . Occupation: retired  Scientific laboratory technician  . Financial resource strain: Not on file  . Food insecurity:    Worry: Not on file    Inability: Not on file  . Transportation needs:    Medical: Not on file    Non-medical: Not on file  Tobacco Use  . Smoking status: Former Smoker    Years: 4.00    Last attempt  to quit: 09/12/1967    Years since quitting: 51.0  . Smokeless tobacco: Never Used  Substance and Sexual Activity  . Alcohol use: No  . Drug use: No  . Sexual activity: Not on file  Lifestyle  . Physical activity:    Days per week: Not on file    Minutes per session: Not on file  . Stress: Not on file  Relationships  . Social connections:    Talks on phone: Not on file    Gets together: Not on file    Attends religious service: Not on file    Active member of club or organization: Not on file    Attends meetings of clubs or organizations: Not on file    Relationship status: Not on file  Other Topics Concern  . Not on file  Social History Narrative  .  Not on file     FAMILY HISTORY:  We obtained a detailed, 4-generation family history.  Significant diagnoses are listed below: Family History  Problem Relation Age of Onset  . Prostate cancer Father        dx >50  . Hypertension Mother   . Breast cancer Sister        dx 20's  . Prostate cancer Brother        dx under 39, metasttic, cause of his death  . Ulcers Brother   . Ulcers Sister     Mr. Matusek has 3 sons and a daughter in their 10's and 63's with no hx of cancer.  H has 4 brothers and 3 sisters: -1 brother died of prostate cancer (metastatic) that was dx under the age of 22. He also had a hx of ulcers.  -1 sister was dx with breast cancer in her 62's.  Mr. Sheridan father: deceased, hx of prostate cancer dx over the age of 49.  This cancer was not the cause of his death, he died of heart disease.  Paternal Aunts/Uncles: 4 paternal uncles, 2 paternal aunts with no hx of cancer. He explained that these relatives are very private about their health and he does not know if they would have told him if they had cancer.  Paternal cousins: no known cancer, limited info Paternal grandfather: died "young" when father was a young child.  Paternal grandmother:died at 72 with no hx of cancer.   Mr. Kielty mother: died at 42 with no hx of cancer.  She had blood clots.  Maternal Aunts/Uncles: 2 maternal aunts and 1 maternal uncle with no known hx of cancer.  Maternal cousins: no hx of cancer.  Maternal grandfather: no hx of cancer.  Maternal grandmother:no hx of cancer.   Mr. Simson is unaware of previous family history of genetic testing for hereditary cancer risks. Patient's maternal ancestors are of African American descent, and paternal ancestors are of African American descent. There is no reported Ashkenazi Jewish ancestry. There is no known consanguinity.  GENETIC COUNSELING ASSESSMENT: OSINACHI NAVARRETTE is a 84 y.o. male with a personal and family history which is somewhat  suggestive of a Hereditary Cancer Predisposition Syndrome. We, therefore, discussed and recommended the following at today's visit.   DISCUSSION: We reviewed the characteristics, features and inheritance patterns of hereditary cancer syndromes. We also discussed genetic testing, including the appropriate family members to test, the process of testing, insurance coverage and turn-around-time for results. We discussed the implications of a negative, positive and/or variant of uncertain significant result. We recommended Mr. Polgar pursue genetic testing for the  Breast Cancer STAT panel gene panel and recommend reflex testing to the Common Hereditary Cancers Panel.   The STAT Breast cancer panel offered by [facility] includes sequencing and rearrangement analysis for the following 9 genes:  ATM, BRCA1, BRCA2, CDH1, CHEK2, PALB2, PTEN, STK11 and TP53.    The Common Hereditary Cancer Panel offered by [facility] includes sequencing and/or deletion duplication testing of the following 53 genes: APC, ATM, AXIN2, BARD1, BMPR1A, BRCA1, BRCA2, BRIP1, BUB1B, CDH1, CDK4, CDKN2A, CHEK2, CTNNA1, DICER1, ENG, EPCAM, GALNT12, GREM1, HOXB13, KIT, MEN1, MLH1, MLH3, MSH2, MSH3, MSH6, MUTYH, NBN, NF1, NTHL1, PALB2, PDGFRA, PMS2, POLD1, POLE, PTEN, RAD50, RAD51C, RAD51D, RNF43, RPS20, SDHA, SDHB, SDHC, SDHD, SMAD4, SMARCA4, STK11, TP53, TSC1, TSC2, VHL  We discussed that only 5-10% of cancers are associated with a Hereditary cancer predisposition syndrome.  One of the most common hereditary cancer syndromes that increases prostate cancer risk is called Hereditary Breast and Ovarian Cancer (HBOC) syndrome.  This syndrome is caused by mutations in the BRCA1 and BRCA2 genes.  This syndrome increases an individual's lifetime risk to develop breast, ovarian, pancreatic, and other types of cancer.  There are also many other cancer predisposition syndromes caused by mutations in several other genes.  We also discussed that in some cases,  having a positive result for some mutations (such as BRCA1/2 for example) may open up an additional treatment option such as a PARP-inhibitor. He understands that even if we find a positive genetic test result, it may or may not impact her treatment options.  However, this information can potentially be helpful for his oncologist to guide treatment recommendations.  We discussed that if he is found to have a mutation in one of these genes, it may impact future medical management recommendations such as increased cancer screenings and consideration of risk reducing surgeries.  A positive result could also have implications for the patient's family members.  A Negative result would mean we were unable to identify a hereditary component to her cancer, but does not rule out the possibility of a hereditary basis for his  cancer.  There could be mutations that are undetectable by current technology, or in genes not yet tested or identified to increase cancer risk.    We discussed the potential to find a Variant of Uncertain Significance or VUS.  These are variants that have not yet been identified as pathogenic or benign, and it is unknown if this variant is associated with increased cancer risk or if this is a normal finding.  Most VUS's are reclassified to benign or likely benign.   It should not be used to make medical management decisions. With time, we suspect the lab will determine the significance of any VUS's identified if any.   Based on Mr. Bazinet personal and family history of cancer, he meets medical criteria for genetic testing. Despite that he meets criteria, he may still have an out of pocket cost. The laboratory can provide him with an estimate of his OOP cost.  he was given the contact information for the laboratory if he has further questions. Marland Kitchen   PLAN: After considering the risks, benefits, and limitations, Mr. Stayer  provided informed consent to pursue genetic testing and the blood  sample was sent to [facility] for analysis of the Breast Cancer STAT panel with plans to reflex to the Common Hereditary Cancers Panel. Preliminary results should be available within approximately 7-10 days' time, at which point they will be disclosed by telephone to Mr. Savoca, as will  any additional recommendations warranted by these results. Mr. See will receive a summary of his genetic counseling visit and a copy of his results once available. This information will also be available in Epic. We encouraged Mr. Crisco to remain in contact with cancer genetics annually so that we can continuously update the family history and inform him of any changes in cancer genetics and testing that may be of benefit for his family. Mr. Seubert questions were answered to his satisfaction today. Our contact information was provided should additional questions or concerns arise.  Based on Mr. Blixt family history, we recommended his relatives also consider genetic testing.  Mr. Schramm will let us know if we can be of any assistance in coordinating genetic counseling and/or testing for this family member.   Lastly, we encouraged Mr. Peixoto to remain in contact with cancer genetics annually so that we can continuously update the family history and inform him of any changes in cancer genetics and testing that may be of benefit for this family.   Mr.  Zhong questions were answered to his satisfaction today. Our contact information was provided should additional questions or concerns arise. Thank you for the referral and allowing [place] to share in the care of your patient.   Tana Felts, MS, [facility] Certified Genetic Counselor [email or web address] phone: [phone]  The patient was seen for a total of 25 minutes in face-to-face genetic counseling.  The patient was accompanied today by his wife, [name].  This patient was discussed with Drs. Magrinat, Lindi Adie and/or Burr Medico who agrees with  the above.

## 2018-10-09 ENCOUNTER — Inpatient Hospital Stay: Payer: Medicare Other

## 2018-10-09 ENCOUNTER — Encounter: Payer: Self-pay | Admitting: Oncology

## 2018-10-09 VITALS — BP 126/87 | HR 64 | Temp 98.0°F | Resp 16

## 2018-10-09 DIAGNOSIS — Z5111 Encounter for antineoplastic chemotherapy: Secondary | ICD-10-CM | POA: Diagnosis not present

## 2018-10-09 DIAGNOSIS — C787 Secondary malignant neoplasm of liver and intrahepatic bile duct: Secondary | ICD-10-CM

## 2018-10-09 MED ORDER — PROCHLORPERAZINE MALEATE 10 MG PO TABS
10.0000 mg | ORAL_TABLET | Freq: Once | ORAL | Status: AC
  Administered 2018-10-09: 10 mg via ORAL

## 2018-10-09 MED ORDER — DEXAMETHASONE 4 MG PO TABS
ORAL_TABLET | ORAL | Status: AC
  Filled 2018-10-09: qty 1

## 2018-10-09 MED ORDER — DEXAMETHASONE SODIUM PHOSPHATE 10 MG/ML IJ SOLN
4.0000 mg | Freq: Once | INTRAMUSCULAR | Status: AC
  Administered 2018-10-09: 4 mg via INTRAVENOUS

## 2018-10-09 MED ORDER — DEXAMETHASONE SODIUM PHOSPHATE 10 MG/ML IJ SOLN
INTRAMUSCULAR | Status: AC
  Filled 2018-10-09: qty 1

## 2018-10-09 MED ORDER — PACLITAXEL PROTEIN-BOUND CHEMO INJECTION 100 MG
125.0000 mg/m2 | Freq: Once | INTRAVENOUS | Status: DC
  Filled 2018-10-09: qty 45

## 2018-10-09 MED ORDER — SODIUM CHLORIDE 0.9 % IV SOLN
Freq: Once | INTRAVENOUS | Status: DC
  Filled 2018-10-09: qty 250

## 2018-10-09 MED ORDER — PROCHLORPERAZINE MALEATE 10 MG PO TABS
ORAL_TABLET | ORAL | Status: AC
  Filled 2018-10-09: qty 1

## 2018-10-09 MED ORDER — SODIUM CHLORIDE 0.9 % IV SOLN
1000.0000 mg/m2 | Freq: Once | INTRAVENOUS | Status: AC
  Administered 2018-10-09: 1710 mg via INTRAVENOUS
  Filled 2018-10-09: qty 44.97

## 2018-10-09 MED ORDER — SODIUM CHLORIDE 0.9 % IV SOLN
Freq: Once | INTRAVENOUS | Status: AC
  Administered 2018-10-09: 15:00:00 via INTRAVENOUS
  Filled 2018-10-09: qty 250

## 2018-10-09 MED ORDER — SODIUM CHLORIDE 0.9 % IV SOLN: 4.0000 mg | Freq: Once | INTRAVENOUS | Status: DC

## 2018-10-09 NOTE — Progress Notes (Signed)
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  Gave my card if interested in applying for grant and for any additional financial questions or concerns.

## 2018-10-09 NOTE — Patient Instructions (Addendum)
[facility] Discharge Instructions for Patients Receiving Chemotherapy  Today you received the following chemotherapy agents: Abraxane, Gemzar  To help prevent nausea and vomiting after your treatment, we encourage you to take your nausea medication as directed.   If you develop nausea and vomiting that is not controlled by your nausea medication, call the clinic.   BELOW ARE SYMPTOMS THAT SHOULD BE REPORTED IMMEDIATELY:  *FEVER GREATER THAN 100.5 F  *CHILLS WITH OR WITHOUT FEVER  NAUSEA AND VOMITING THAT IS NOT CONTROLLED WITH YOUR NAUSEA MEDICATION  *UNUSUAL SHORTNESS OF BREATH  *UNUSUAL BRUISING OR BLEEDING  TENDERNESS IN MOUTH AND THROAT WITH OR WITHOUT PRESENCE OF ULCERS  *URINARY PROBLEMS  *BOWEL PROBLEMS  UNUSUAL RASH Items with * indicate a potential emergency and should be followed up as soon as possible.  Feel free to call the clinic should you have any questions or concerns. The clinic phone number is [phone].  Please show the [facility] at check-in to the Emergency Department and triage nurse.  Nanoparticle Albumin-Bound Paclitaxel injection What is this medicine? NANOPARTICLE ALBUMIN-BOUND PACLITAXEL (Na no PAHR ti kuhl al BYOO muhn-bound PAK li TAX el) is a chemotherapy drug. It targets fast dividing cells, like cancer cells, and causes these cells to die. This medicine is used to treat advanced breast cancer, lung cancer, and pancreatic cancer. This medicine may be used for other purposes; ask your health care provider or pharmacist if you have questions. COMMON BRAND NAME(S): Abraxane What should I tell my health care provider before I take this medicine? They need to know if you have any of these conditions: -kidney disease -liver disease -low blood counts, like low white cell, platelet, or red cell counts -lung or breathing disease, like asthma -tingling of the fingers or toes, or other nerve disorder -an unusual or allergic  reaction to paclitaxel, albumin, other chemotherapy, other medicines, foods, dyes, or preservatives -pregnant or trying to get pregnant -breast-feeding How should I use this medicine? This drug is given as an infusion into a vein. It is administered in a hospital or clinic by a specially trained health care professional. Talk to your pediatrician regarding the use of this medicine in children. Special care may be needed. Overdosage: If you think you have taken too much of this medicine contact a poison control center or emergency room at once. NOTE: This medicine is only for you. Do not share this medicine with others. What if I miss a dose? It is important not to miss your dose. Call your doctor or health care professional if you are unable to keep an appointment. What may interact with this medicine? This medicine may interact with the following medications: -antiviral medicines for hepatitis, HIV or AIDS -certain antibiotics like erythromycin and clarithromycin -certain medicines for fungal infections like ketoconazole and itraconazole -certain medicines for seizures like carbamazepine, phenobarbital, phenytoin -gemfibrozil -nefazodone -rifampin -St. John's wort This list may not describe all possible interactions. Give your health care provider a list of all the medicines, herbs, non-prescription drugs, or dietary supplements you use. Also tell them if you smoke, drink alcohol, or use illegal drugs. Some items may interact with your medicine. What should I watch for while using this medicine? Your condition will be monitored carefully while you are receiving this medicine. You will need important blood work done while you are taking this medicine. This medicine can cause serious allergic reactions. If you experience allergic reactions like skin rash, itching or hives, swelling of the  face, lips, or tongue, tell your doctor or health care professional right away. In some cases, you may be  given additional medicines to help with side effects. Follow all directions for their use. This drug may make you feel generally unwell. This is not uncommon, as chemotherapy can affect healthy cells as well as cancer cells. Report any side effects. Continue your course of treatment even though you feel ill unless your doctor tells you to stop. Call your doctor or health care professional for advice if you get a fever, chills or sore throat, or other symptoms of a cold or flu. Do not treat yourself. This drug decreases your body's ability to fight infections. Try to avoid being around people who are sick. This medicine may increase your risk to bruise or bleed. Call your doctor or health care professional if you notice any unusual bleeding. Be careful brushing and flossing your teeth or using a toothpick because you may get an infection or bleed more easily. If you have any dental work done, tell your dentist you are receiving this medicine. Avoid taking products that contain aspirin, acetaminophen, ibuprofen, naproxen, or ketoprofen unless instructed by your doctor. These medicines may hide a fever. Do not become pregnant while taking this medicine or for 6 months after stopping it. Women should inform their doctor if they wish to become pregnant or think they might be pregnant. Men should not father a child while taking this medicine or for 3 months after stopping it. There is a potential for serious side effects to an unborn child. Talk to your health care professional or pharmacist for more information. Do not breast-feed an infant while taking this medicine or for 2 weeks after stopping it. This medicine may interfere with the ability to get pregnant or to father a child. You should talk to your doctor or health care professional if you are concerned about your fertility. What side effects may I notice from receiving this medicine? Side effects that you should report to your doctor or health care  professional as soon as possible: -allergic reactions like skin rash, itching or hives, swelling of the face, lips, or tongue -breathing problems -changes in vision -fast, irregular heartbeat -low blood pressure -mouth sores -pain, tingling, numbness in the hands or feet -signs of decreased platelets or bleeding - bruising, pinpoint red spots on the skin, black, tarry stools, blood in the urine -signs of decreased red blood cells - unusually weak or tired, feeling faint or lightheaded, falls -signs of infection - fever or chills, cough, sore throat, pain or difficulty passing urine -signs and symptoms of liver injury like dark yellow or brown urine; general ill feeling or flu-like symptoms; light-colored stools; loss of appetite; nausea; right upper belly pain; unusually weak or tired; yellowing of the eyes or skin -swelling of the ankles, feet, hands -unusually slow heartbeat Side effects that usually do not require medical attention (report to your doctor or health care professional if they continue or are bothersome): -diarrhea -hair loss -loss of appetite -nausea, vomiting -tiredness This list may not describe all possible side effects. Call your doctor for medical advice about side effects. You may report side effects to FDA at 1-800-FDA-1088. Where should I keep my medicine? This drug is given in a hospital or clinic and will not be stored at home. NOTE: This sheet is a summary. It may not cover all possible information. If you have questions about this medicine, talk to your doctor, pharmacist, or health care provider.  2019 Elsevier/Gold Standard (2017-05-01 13:03:45)  Gemcitabine injection What is this medicine? GEMCITABINE (jem SYE ta been) is a chemotherapy drug. This medicine is used to treat many types of cancer like breast cancer, lung cancer, pancreatic cancer, and ovarian cancer. This medicine may be used for other purposes; ask your health care provider or pharmacist if  you have questions. COMMON BRAND NAME(S): Gemzar, Infugem What should I tell my health care provider before I take this medicine? They need to know if you have any of these conditions: -blood disorders -infection -kidney disease -liver disease -lung or breathing disease, like asthma -recent or ongoing radiation therapy -an unusual or allergic reaction to gemcitabine, other chemotherapy, other medicines, foods, dyes, or preservatives -pregnant or trying to get pregnant -breast-feeding How should I use this medicine? This drug is given as an infusion into a vein. It is administered in a hospital or clinic by a specially trained health care professional. Talk to your pediatrician regarding the use of this medicine in children. Special care may be needed. Overdosage: If you think you have taken too much of this medicine contact a poison control center or emergency room at once. NOTE: This medicine is only for you. Do not share this medicine with others. What if I miss a dose? It is important not to miss your dose. Call your doctor or health care professional if you are unable to keep an appointment. What may interact with this medicine? -medicines to increase blood counts like filgrastim, pegfilgrastim, sargramostim -some other chemotherapy drugs like cisplatin -vaccines Talk to your doctor or health care professional before taking any of these medicines: -acetaminophen -aspirin -ibuprofen -ketoprofen -naproxen This list may not describe all possible interactions. Give your health care provider a list of all the medicines, herbs, non-prescription drugs, or dietary supplements you use. Also tell them if you smoke, drink alcohol, or use illegal drugs. Some items may interact with your medicine. What should I watch for while using this medicine? Visit your doctor for checks on your progress. This drug may make you feel generally unwell. This is not uncommon, as chemotherapy can affect  healthy cells as well as cancer cells. Report any side effects. Continue your course of treatment even though you feel ill unless your doctor tells you to stop. In some cases, you may be given additional medicines to help with side effects. Follow all directions for their use. Call your doctor or health care professional for advice if you get a fever, chills or sore throat, or other symptoms of a cold or flu. Do not treat yourself. This drug decreases your body's ability to fight infections. Try to avoid being around people who are sick. This medicine may increase your risk to bruise or bleed. Call your doctor or health care professional if you notice any unusual bleeding. Be careful brushing and flossing your teeth or using a toothpick because you may get an infection or bleed more easily. If you have any dental work done, tell your dentist you are receiving this medicine. Avoid taking products that contain aspirin, acetaminophen, ibuprofen, naproxen, or ketoprofen unless instructed by your doctor. These medicines may hide a fever. Do not become pregnant while taking this medicine or for 6 months after stopping it. Women should inform their doctor if they wish to become pregnant or think they might be pregnant. Men should not father a child while taking this medicine and for 3 months after stopping it. There is a potential for serious side effects to an  unborn child. Talk to your health care professional or pharmacist for more information. Do not breast-feed an infant while taking this medicine or for at least 1 week after stopping it. Men should inform their doctors if they wish to father a child. This medicine may lower sperm counts. Talk with your doctor or health care professional if you are concerned about your fertility. What side effects may I notice from receiving this medicine? Side effects that you should report to your doctor or health care professional as soon as possible: -allergic reactions  like skin rash, itching or hives, swelling of the face, lips, or tongue -breathing problems -pain, redness, or irritation at site where injected -signs and symptoms of a dangerous change in heartbeat or heart rhythm like chest pain; dizziness; fast or irregular heartbeat; palpitations; feeling faint or lightheaded, falls; breathing problems -signs of decreased platelets or bleeding - bruising, pinpoint red spots on the skin, black, tarry stools, blood in the urine -signs of decreased red blood cells - unusually weak or tired, feeling faint or lightheaded, falls -signs of infection - fever or chills, cough, sore throat, pain or difficulty passing urine -signs and symptoms of kidney injury like trouble passing urine or change in the amount of urine -signs and symptoms of liver injury like dark yellow or brown urine; general ill feeling or flu-like symptoms; light-colored stools; loss of appetite; nausea; right upper belly pain; unusually weak or tired; yellowing of the eyes or skin -swelling of ankles, feet, hands Side effects that usually do not require medical attention (report to your doctor or health care professional if they continue or are bothersome): -constipation -diarrhea -hair loss -loss of appetite -nausea -rash -vomiting This list may not describe all possible side effects. Call your doctor for medical advice about side effects. You may report side effects to FDA at 1-800-FDA-1088. Where should I keep my medicine? This drug is given in a hospital or clinic and will not be stored at home. NOTE: This sheet is a summary. It may not cover all possible information. If you have questions about this medicine, talk to your doctor, pharmacist, or health care provider.  2019 Elsevier/Gold Standard (2017-11-21 18:06:11)

## 2018-10-11 ENCOUNTER — Telehealth: Payer: Self-pay | Admitting: Oncology

## 2018-10-11 ENCOUNTER — Telehealth: Payer: Self-pay | Admitting: *Deleted

## 2018-10-11 NOTE — Telephone Encounter (Signed)
This RN spoke with pt's wife who called with concerns.  Mrs Slivinski states " last night Orange's stomach started hurting so I gave him an Aleve, is that what I should do ?"  This RN inquired further with Mrs Antkowiak- she states pt has some nausea - took the compazine but then some of the pain continued so she gave him the Aleve.  This morning pt is pain free,awake and has had oatmeal with fruit.  Pt verifies with wife per inquiry by this RN that he is having bowel movements.  This RN discussed above and verified to Mrs Popowski she provided appropriate care.  No further questions or needs at this time.

## 2018-10-11 NOTE — Telephone Encounter (Signed)
Scheduled appt per 1/31 sch message -pt is aware of appt date and time

## 2018-10-14 ENCOUNTER — Ambulatory Visit: Payer: Self-pay | Admitting: Genetics

## 2018-10-14 ENCOUNTER — Encounter: Payer: Self-pay | Admitting: Genetics

## 2018-10-14 ENCOUNTER — Telehealth: Payer: Self-pay | Admitting: Genetics

## 2018-10-14 DIAGNOSIS — C799 Secondary malignant neoplasm of unspecified site: Principal | ICD-10-CM

## 2018-10-14 DIAGNOSIS — C169 Malignant neoplasm of stomach, unspecified: Secondary | ICD-10-CM

## 2018-10-14 DIAGNOSIS — C257 Malignant neoplasm of other parts of pancreas: Secondary | ICD-10-CM

## 2018-10-14 DIAGNOSIS — C61 Malignant neoplasm of prostate: Secondary | ICD-10-CM

## 2018-10-14 DIAGNOSIS — Z803 Family history of malignant neoplasm of breast: Secondary | ICD-10-CM

## 2018-10-14 DIAGNOSIS — Z8042 Family history of malignant neoplasm of prostate: Secondary | ICD-10-CM

## 2018-10-14 DIAGNOSIS — Z1379 Encounter for other screening for genetic and chromosomal anomalies: Secondary | ICD-10-CM

## 2018-10-14 NOTE — Progress Notes (Signed)
HPI:  Mr. Koman was previously seen in the [facility] on 10/04/2018 due to a personal and family history of cancer and concerns regarding a hereditary predisposition to cancer. Please refer to our prior cancer genetics clinic note for more information regarding Mr. Malinoski's medical, social and family histories, and our assessment and recommendations, at the time. Mr. Ketelsen recent genetic test results were disclosed to him, as well as recommendations warranted by these results. These results and recommendations are discussed in more detail below.  CANCER HISTORY:    Liver metastases ([place])   09/19/2018 Initial Diagnosis    Liver metastases ([place] [facility])    10/09/2018 -  Chemotherapy    The patient had gemcitabine (GEMZAR) 1,710 mg in sodium chloride 0.9 % 250 mL chemo infusion, 1,000 mg/m2 = 1,710 mg, Intravenous,  Once, 1 of 4 cycles Administration: 1,710 mg (10/09/2018) PACLitaxel-protein bound (ABRAXANE) chemo infusion 225 mg, 125 mg/m2 = 225 mg, Intravenous, Once, 1 of 4 cycles  for chemotherapy treatment.       FAMILY HISTORY:  We obtained a detailed, 4-generation family history.  Significant diagnoses are listed below: Family History  Problem Relation Age of Onset  . Prostate cancer Father        dx >50  . Hypertension Mother   . Ulcers Mother   . Breast cancer Sister        dx 20's  . Prostate cancer Brother        dx under 27, metasttic, cause of his death  . Ulcers Brother   . Ulcers Sister      Mr. Ingwersen has 3 sons and a daughter in their 58's and 74's with no hx of cancer.  H has 4 brothers and 3 sisters: -1 brother died of prostate cancer (metastatic) that was dx under the age of 6. He also had a hx of ulcers.  -1 sister was dx with breast cancer in her 74's.  Mr. Thelin father: deceased, hx of prostate cancer dx over the age of 68.  This cancer was not the cause of his death, he died of heart disease.  Paternal Aunts/Uncles: 4 paternal  uncles, 2 paternal aunts with no hx of cancer. He explained that these relatives are very private about their health and he does not know if they would have told him if they had cancer.  Paternal cousins: no known cancer, limited info Paternal grandfather: died "young" when father was a young child.  Paternal grandmother:died at 89 with no hx of cancer.   Mr. Olivencia mother: died at 90 with no hx of cancer.  She had blood clots.  Maternal Aunts/Uncles: 2 maternal aunts and 1 maternal uncle with no known hx of cancer.  Maternal cousins: no hx of cancer.  Maternal grandfather: no hx of cancer.  Maternal grandmother:no hx of cancer.   Mr. Meriwether is unaware of previous family history of genetic testing for hereditary cancer risks. Patient's maternal ancestors are of African American descent, and paternal ancestors are of African American descent. There is no reported Ashkenazi Jewish ancestry. There is no known consanguinity.  GENETIC TEST RESULTS: Genetic testing performed through [facility]'s Common Hereditary Cancers Panel reported out on 10/14/2018 showed no pathogenic mutations.   The Common Hereditary Cancer Panel offered by [facility] includes sequencing and/or deletion duplication testing of the following 53 genes: APC, ATM, AXIN2, BARD1, BMPR1A, BRCA1, BRCA2, BRIP1, BUB1B, CDH1, CDK4, CDKN2A, CHEK2, CTNNA1, DICER1, ENG, EPCAM, GALNT12, GREM1, HOXB13, KIT, MEN1, MLH1, MLH3, MSH2, MSH3,  MSH6, MUTYH, NBN, NF1, NTHL1, PALB2, PDGFRA, PMS2, POLD1, POLE, PTEN, RAD50, RAD51C, RAD51D, RNF43, RPS20, SDHA, SDHB, SDHC, SDHD, SMAD4, SMARCA4, STK11, TP53, TSC1, TSC2, VHL.  A variant of uncertain significance (VUS) in a gene called GALNT12 was also noted. c.[id number] ([id number])  The test report will be scanned into EPIC and will be located under the Molecular Pathology section of the Results Review tab. A portion of the result report is included below for reference.     We discussed with Mr. Rathbone that  because current genetic testing is not perfect, it is possible there may be a gene mutation in one of these genes that current testing cannot detect, but that chance is small.  We also discussed, that there could be another gene that has not yet been discovered, or that we have not yet tested, that is responsible for the cancer diagnoses in the family. It is also possible there is a hereditary cause for the cancer in the family that Mr. Cappuccio did not inherit and therefore was not identified in his testing.  Therefore, it is important to remain in touch with cancer genetics in the future so that we can continue to offer Mr. Parkerson the most up to date genetic testing.   Regarding the VUS in GALNT12: At this time, it is unknown if this variant is associated with increased cancer risk or if this is a normal finding, but most variants such as this get reclassified to being inconsequential. It should not be used to make medical management decisions. With time, we suspect the lab will determine the significance of this variant, if any. If we do learn more about it, we will try to contact Mr. Poplaski to discuss it further. However, it is important to stay in touch with [place] periodically and keep the address and phone number up to date.  ADDITIONAL GENETIC TESTING: We discussed with Mr. Cokley that his genetic testing was fairly extensive.  If there are are genes identified to increase cancer risk that can be analyzed in the future, we would be happy to discuss and coordinate this testing at that time.    CANCER SCREENING RECOMMENDATIONS: This negative result means that we were unable to identify a hereditary cause for his personal and family history of cancer at this time.  This result does not necessarily rule out a hereditary cause for his  cancer.  It is still possible that there could be genetic mutations that are undetectable by current technology, or genetic mutations in genes that have not been tested or  identified to increase cancer risk.  Therefore, it is recommended he continue to follow the cancer management and screening guidelines provided by his oncology and primary healthcare provider. An individual's cancer risk is not determined by genetic test results alone.  Overall cancer risk assessment includes additional factors such as personal medical history, family history, etc.  These should be used to make a personalized plan for cancer prevention and surveillance.    RECOMMENDATIONS FOR FAMILY MEMBERS:  Relatives in this family might be at some increased risk of developing cancer, over the general population risk, simply due to the family history of cancer.  We recommended women in this family have a yearly mammogram beginning at age 7, or 71 years younger than the earliest onset of cancer, an annual clinical breast exam, and perform monthly breast self-exams. Women in this family should also have a gynecological exam as recommended by their primary provider. All family members should have a  colonoscopy by age 19 (or as directed by their doctors).  All family members should inform their physicians about the family history of cancer so their doctors can make the most appropriate screening recommendations for them.   Particularly, men in this family should discuss increased prostate cancer screening.   It is also possible there is a hereditary cause for the cancer in Mr. Math family that he did not inherit and therefore was not identified in him.  Therefore, we recommended relatives have genetic counseling and testing. Mr. Kaluzny will let us know if we can be of any assistance in coordinating genetic counseling and/or testing for these family members.   FOLLOW-UP: Lastly, we discussed with Mr. Eppinger that cancer genetics is a rapidly advancing field and it is possible that new genetic tests will be appropriate for him and/or his family members in the future. We encouraged him to remain in  contact with cancer genetics on an annual basis so we can update his personal and family histories and let him know of advances in cancer genetics that may benefit this family.   Our contact number was provided. Mr. Hedeen questions were answered to his satisfaction, and he knows he is welcome to call us at anytime with additional questions or concerns.   Ferol Luz, MS, [facility] Certified Genetic Counselor [email or web address]

## 2018-10-14 NOTE — Telephone Encounter (Signed)
Revealed negative genetic testing.  Revealed that a VUS in GALNT12 was identified.   This normal result indicates that it is unlikely Peter Wood's cancer is due to a hereditary cause.  It is unlikely that there is an increased risk of another cancer due to a mutation in one of these genes.  However, genetic testing is not perfect, and cannot definitively rule out a hereditary cause.  It will be important for him to keep in contact with genetics to learn if any additional testing may be needed in the future.    We did recommend his other relatives have genetic testing.  He does have a somewhat significant family history and there could be a genetic cause for the cancer in the family that Peter Wood did not inherit and therefore was not found in him.   We recommended he continue to follow his doctors' recommendations for cancer treatment and screening.

## 2018-10-15 ENCOUNTER — Telehealth: Payer: Self-pay | Admitting: *Deleted

## 2018-10-15 ENCOUNTER — Inpatient Hospital Stay: Payer: Medicare Other | Attending: Oncology

## 2018-10-15 ENCOUNTER — Other Ambulatory Visit: Payer: Self-pay | Admitting: *Deleted

## 2018-10-15 DIAGNOSIS — C250 Malignant neoplasm of head of pancreas: Secondary | ICD-10-CM | POA: Insufficient documentation

## 2018-10-15 DIAGNOSIS — Z8546 Personal history of malignant neoplasm of prostate: Secondary | ICD-10-CM | POA: Insufficient documentation

## 2018-10-15 DIAGNOSIS — C169 Malignant neoplasm of stomach, unspecified: Secondary | ICD-10-CM | POA: Insufficient documentation

## 2018-10-15 DIAGNOSIS — C7951 Secondary malignant neoplasm of bone: Secondary | ICD-10-CM | POA: Insufficient documentation

## 2018-10-15 DIAGNOSIS — C787 Secondary malignant neoplasm of liver and intrahepatic bile duct: Secondary | ICD-10-CM | POA: Insufficient documentation

## 2018-10-15 DIAGNOSIS — Z7901 Long term (current) use of anticoagulants: Secondary | ICD-10-CM | POA: Insufficient documentation

## 2018-10-15 DIAGNOSIS — Z79899 Other long term (current) drug therapy: Secondary | ICD-10-CM | POA: Insufficient documentation

## 2018-10-15 DIAGNOSIS — D4989 Neoplasm of unspecified behavior of other specified sites: Secondary | ICD-10-CM

## 2018-10-15 DIAGNOSIS — Z87891 Personal history of nicotine dependence: Secondary | ICD-10-CM | POA: Insufficient documentation

## 2018-10-15 DIAGNOSIS — Z5111 Encounter for antineoplastic chemotherapy: Secondary | ICD-10-CM | POA: Insufficient documentation

## 2018-10-15 DIAGNOSIS — Z86711 Personal history of pulmonary embolism: Secondary | ICD-10-CM | POA: Insufficient documentation

## 2018-10-15 NOTE — Telephone Encounter (Signed)
This RN was informed by the education nurse that pt is interested in having a port placed for chemotherapy.  Pt will be receiving d8 of a 28 day regimen of gemzar and abraxane.  Per MD review of above request is to evaluate labs to be done with his treatment tomorrow for when is the best time for port placement- either prior to d15 or wait until closer to cycle 2.

## 2018-10-16 ENCOUNTER — Inpatient Hospital Stay: Payer: Medicare Other

## 2018-10-16 ENCOUNTER — Telehealth: Payer: Self-pay | Admitting: Adult Health

## 2018-10-16 ENCOUNTER — Other Ambulatory Visit: Payer: Self-pay | Admitting: Adult Health

## 2018-10-16 ENCOUNTER — Inpatient Hospital Stay (HOSPITAL_BASED_OUTPATIENT_CLINIC_OR_DEPARTMENT_OTHER): Payer: Medicare Other | Admitting: Adult Health

## 2018-10-16 ENCOUNTER — Encounter: Payer: Self-pay | Admitting: Adult Health

## 2018-10-16 VITALS — BP 127/72 | HR 77 | Temp 98.6°F | Resp 16 | Ht 64.5 in | Wt 140.0 lb

## 2018-10-16 VITALS — BP 144/89 | HR 58 | Temp 98.0°F | Resp 16

## 2018-10-16 DIAGNOSIS — C250 Malignant neoplasm of head of pancreas: Secondary | ICD-10-CM | POA: Diagnosis not present

## 2018-10-16 DIAGNOSIS — Z86711 Personal history of pulmonary embolism: Secondary | ICD-10-CM | POA: Diagnosis not present

## 2018-10-16 DIAGNOSIS — Z79899 Other long term (current) drug therapy: Secondary | ICD-10-CM | POA: Diagnosis not present

## 2018-10-16 DIAGNOSIS — C257 Malignant neoplasm of other parts of pancreas: Secondary | ICD-10-CM

## 2018-10-16 DIAGNOSIS — Z8546 Personal history of malignant neoplasm of prostate: Secondary | ICD-10-CM | POA: Diagnosis not present

## 2018-10-16 DIAGNOSIS — C787 Secondary malignant neoplasm of liver and intrahepatic bile duct: Secondary | ICD-10-CM

## 2018-10-16 DIAGNOSIS — Z7901 Long term (current) use of anticoagulants: Secondary | ICD-10-CM

## 2018-10-16 DIAGNOSIS — Z87891 Personal history of nicotine dependence: Secondary | ICD-10-CM

## 2018-10-16 DIAGNOSIS — C799 Secondary malignant neoplasm of unspecified site: Secondary | ICD-10-CM

## 2018-10-16 DIAGNOSIS — Z5111 Encounter for antineoplastic chemotherapy: Secondary | ICD-10-CM | POA: Diagnosis not present

## 2018-10-16 DIAGNOSIS — C169 Malignant neoplasm of stomach, unspecified: Secondary | ICD-10-CM

## 2018-10-16 DIAGNOSIS — D4989 Neoplasm of unspecified behavior of other specified sites: Secondary | ICD-10-CM

## 2018-10-16 DIAGNOSIS — C7951 Secondary malignant neoplasm of bone: Secondary | ICD-10-CM | POA: Diagnosis not present

## 2018-10-16 DIAGNOSIS — D649 Anemia, unspecified: Secondary | ICD-10-CM

## 2018-10-16 DIAGNOSIS — C61 Malignant neoplasm of prostate: Secondary | ICD-10-CM

## 2018-10-16 LAB — CBC WITH DIFFERENTIAL (CANCER CENTER ONLY)
ABS IMMATURE GRANULOCYTES: 0.04 10*3/uL (ref 0.00–0.07)
Basophils Absolute: 0 10*3/uL (ref 0.0–0.1)
Basophils Relative: 1 %
Eosinophils Absolute: 0.1 10*3/uL (ref 0.0–0.5)
Eosinophils Relative: 2 %
HCT: 23.4 % — ABNORMAL LOW (ref 39.0–52.0)
HEMOGLOBIN: 7.5 g/dL — AB (ref 13.0–17.0)
Immature Granulocytes: 1 %
LYMPHS PCT: 29 %
Lymphs Abs: 0.9 10*3/uL (ref 0.7–4.0)
MCH: 28.8 pg (ref 26.0–34.0)
MCHC: 32.1 g/dL (ref 30.0–36.0)
MCV: 90 fL (ref 80.0–100.0)
Monocytes Absolute: 0.3 10*3/uL (ref 0.1–1.0)
Monocytes Relative: 9 %
Neutro Abs: 1.9 10*3/uL (ref 1.7–7.7)
Neutrophils Relative %: 58 %
Platelet Count: 185 10*3/uL (ref 150–400)
RBC: 2.6 MIL/uL — ABNORMAL LOW (ref 4.22–5.81)
RDW: 14.8 % (ref 11.5–15.5)
WBC Count: 3.3 10*3/uL — ABNORMAL LOW (ref 4.0–10.5)
nRBC: 0.6 % — ABNORMAL HIGH (ref 0.0–0.2)

## 2018-10-16 LAB — CMP (CANCER CENTER ONLY)
ALT: 47 U/L — ABNORMAL HIGH (ref 0–44)
AST: 76 U/L — ABNORMAL HIGH (ref 15–41)
Albumin: 3.1 g/dL — ABNORMAL LOW (ref 3.5–5.0)
Alkaline Phosphatase: 289 U/L — ABNORMAL HIGH (ref 38–126)
Anion gap: 8 (ref 5–15)
BUN: 19 mg/dL (ref 8–23)
CHLORIDE: 105 mmol/L (ref 98–111)
CO2: 28 mmol/L (ref 22–32)
Calcium: 9.3 mg/dL (ref 8.9–10.3)
Creatinine: 1.02 mg/dL (ref 0.61–1.24)
GFR, Est AFR Am: >60 mL/min (ref >60)
GFR, Estimated: >60 mL/min (ref >60)
Glucose, Bld: 132 mg/dL — ABNORMAL HIGH (ref 70–99)
POTASSIUM: 4 mmol/L (ref 3.5–5.1)
SODIUM: 141 mmol/L (ref 135–145)
Total Bilirubin: 0.7 mg/dL (ref 0.3–1.2)
Total Protein: 6.7 g/dL (ref 6.5–8.1)

## 2018-10-16 LAB — PREPARE RBC (CROSSMATCH)

## 2018-10-16 LAB — ABO/RH: ABO/RH(D): O POS

## 2018-10-16 MED ORDER — ACETAMINOPHEN 325 MG PO TABS
650.0000 mg | ORAL_TABLET | Freq: Once | ORAL | Status: AC
  Administered 2018-10-16: 650 mg via ORAL

## 2018-10-16 MED ORDER — ACETAMINOPHEN 325 MG PO TABS
ORAL_TABLET | ORAL | Status: AC
  Filled 2018-10-16: qty 2

## 2018-10-16 MED ORDER — SODIUM CHLORIDE 0.9 % IV SOLN
1000.0000 mg/m2 | Freq: Once | INTRAVENOUS | Status: AC
  Administered 2018-10-16: 1710 mg via INTRAVENOUS
  Filled 2018-10-16: qty 44.97

## 2018-10-16 MED ORDER — SODIUM CHLORIDE 0.9% IV SOLUTION
250.0000 mL | Freq: Once | INTRAVENOUS | Status: AC
  Filled 2018-10-16: qty 250

## 2018-10-16 MED ORDER — DEXAMETHASONE SODIUM PHOSPHATE 10 MG/ML IJ SOLN
4.0000 mg | Freq: Once | INTRAMUSCULAR | Status: AC
  Administered 2018-10-16: 4 mg via INTRAVENOUS

## 2018-10-16 MED ORDER — DIPHENHYDRAMINE HCL 25 MG PO CAPS
ORAL_CAPSULE | ORAL | Status: AC
  Filled 2018-10-16: qty 1

## 2018-10-16 MED ORDER — DIPHENHYDRAMINE HCL 25 MG PO CAPS
25.0000 mg | ORAL_CAPSULE | Freq: Once | ORAL | Status: AC
  Administered 2018-10-16: 25 mg via ORAL

## 2018-10-16 MED ORDER — PACLITAXEL PROTEIN-BOUND CHEMO INJECTION 100 MG
125.0000 mg/m2 | Freq: Once | INTRAVENOUS | Status: AC
  Administered 2018-10-16: 225 mg via INTRAVENOUS
  Filled 2018-10-16: qty 45

## 2018-10-16 MED ORDER — PROCHLORPERAZINE MALEATE 10 MG PO TABS
ORAL_TABLET | ORAL | Status: AC
  Filled 2018-10-16: qty 1

## 2018-10-16 MED ORDER — SODIUM CHLORIDE 0.9 % IV SOLN
Freq: Once | INTRAVENOUS | Status: AC
  Administered 2018-10-16: 11:00:00 via INTRAVENOUS
  Filled 2018-10-16: qty 250

## 2018-10-16 MED ORDER — FUROSEMIDE 10 MG/ML IJ SOLN
10.0000 mg | Freq: Once | INTRAMUSCULAR | Status: AC
  Administered 2018-10-16: 10 mg via INTRAVENOUS

## 2018-10-16 MED ORDER — PROCHLORPERAZINE MALEATE 10 MG PO TABS
10.0000 mg | ORAL_TABLET | Freq: Once | ORAL | Status: AC
  Administered 2018-10-16: 10 mg via ORAL

## 2018-10-16 MED ORDER — DEXAMETHASONE SODIUM PHOSPHATE 10 MG/ML IJ SOLN
INTRAMUSCULAR | Status: AC
  Filled 2018-10-16: qty 1

## 2018-10-16 MED ORDER — FUROSEMIDE 10 MG/ML IJ SOLN
INTRAMUSCULAR | Status: AC
  Filled 2018-10-16: qty 2

## 2018-10-16 NOTE — Telephone Encounter (Signed)
Gave avs and calendar ° °

## 2018-10-16 NOTE — Progress Notes (Addendum)
[place]  [phone] [phone]     ID: Peter Wood DOB: March 09, 1935  MR#: [id number]  IHK#:[id number]  Patient Care Team: Cassandria Anger, MD as PCP - General Milus Banister, MD as Attending Physician (Gastroenterology) Magrinat, Virgie Dad, MD as Consulting Physician (Oncology) Gatha Mayer, MD as Consulting Physician (Gastroenterology) [name], MD as Consulting Physician (Ophthalmology) Ramon Dredge, MD as Referring Physician (Radiation Oncology) OTHER MD:   CHIEF COMPLAINT: Metastatic pancreatic adenocarcinoma  CURRENT TREATMENT: Gemcitabine, liposomal paclitaxel (Abraxane)   HISTORY OF CURRENT ILLNESS: Peter Wood presented to the emergency room 09/07/2018 with mid/upper abdominal pain.  He reported weight loss of 25 pounds over the past 6 months and significant anorexia.  His liver function tests were abnormal and a right upper quadrant ultrasound was obtained.  This was read as concerning for malignancy and a CT of the abdomen and pelvis with contrast was obtained the same day, showing  1. Too numerous to count hypodense masses of the liver concerning for hepatic metastasis. In the region of the porta hepatis, there are hypodense lesions more likely associated with the liver though the pancreatic head is partially obscured by a 3.2 cm hypodense mass. Findings are more likely associated with the liver and less likely an isolated pancreatic mass. 2. Upper abdominal mesenteric and peripancreatic lymphadenopathy suspicious for metastatic disease. 3. 6 mm sclerotic density in the left iliac bone adjacent to the SI joint. Given history of prostate cancer, differential considerations would include osteoblastic metastasis or possibly bone island. Showed only diverticular disease.  He was referred to GI and Dr. Ardis Hughs notes a colonoscopy performed 04/23/2017 makes the possibility of colon cancer unlikely.  Further  work-up was planned, but on 09/19/2018 the patient again presented to the emergency room with chest pain, now in a different area, and a staging CT scan of the chest showed filling defects in the right lower lobe pulmonary arteries consistent with embolism.  There was a small pericardial effusion and an additional 2.4 cm left thyroid mass.  Aortic atherosclerosis was noted  He was started on intravenous heparin and on 09/20/2018 underwent upper endoscopy under Dr. Carlean Purl showing a giant nonbleeding cratered gastric ulcer in the posterior wall of the stomach.  Biopsies of this procedure showed (SZA 20-187) adenocarcinoma.    At that point we were consulted and we obtained tumor markers which included a CEA of 1318.2, a CA 19-9 of 67,479, a normal AFP and a normal PSA.  I discussed the case with pathology and they do not feel that additional testing would be helpful in terms of discriminating between a primary gastric and a primary biliary/pancreatic tumor.  The patient's subsequent history is as detailed below.   INTERVAL HISTORY: Peter Wood is here today accompanied by his wife for his newly diagnosed metastatic pancreatic cancer.  He started treatment last week; today is cycle 1 day 8 of Gemcitabine/Abraxane.  He receives this on days 1, 8 and 15 of a 28 day cycle.  He feels like he tolerated his treatment well.    REVIEW OF SYSTEMS: Peter Wood is feeling moderately well today.  He noted some mild nausea last week and that resolved with anti nausea medication.  His hemoglobin has decreased.  He had one episode of stool being slightly darker, but that has since resolved.  He continues on eliquis BID and denies any easy bruising/bleeding.  He did take Aleve once last week for pain, but the pain has since  resolved.  He is inquiring into getting a port placed so he doesn't have to undergo so many needle sticks.    Peter Wood denies unusual headaches or vision changes.  His appetite is good and he hasn't noted  mucositis, dysphagia, vomiting, bowel/bladder concerns.  He denies chest pain, palpitations, cough, shortness of breath.  He denies peripheral neuropathy.  A detailed ROS was otherwise non contributory.     PAST MEDICAL HISTORY: Past Medical History:  Diagnosis Date  . Arthritis   . Diverticulosis of colon   . Family history of breast cancer   . Family history of prostate cancer   . HTN (hypertension)   . Hx of colonic polyp   . Pneumonia    as a child/baby  . Poor circulation of extremity    right leg  . Prostate cancer ([place]) 2015   prostate cancer - radiation treated     PAST SURGICAL HISTORY: Past Surgical History:  Procedure Laterality Date  . [place] VITRECTOMY WITH 20 GAUGE MVR PORT FOR MACULAR HOLE Right 09/19/2016   Procedure: 25 GAUGE PARS PLANA VITRECTOMY WITH 20 GAUGE MVR PORT FOR MACULAR HOLE, MEMBRANE PEEL, SERUM PATCH, GAS FLUID EXCHANGE, HEADSCOPE LASER;  Surgeon: [name], MD;  Location: [place];  Service: Ophthalmology;  Laterality: Right;  . BIOPSY  09/20/2018   Procedure: BIOPSY;  Surgeon: Gatha Mayer, MD;  Location: [facility] ENDOSCOPY;  Service: Endoscopy;;  . CATARACT EXTRACTION  08/27/12   Right  . COLONOSCOPY    . ESOPHAGOGASTRODUODENOSCOPY (EGD) WITH PROPOFOL N/A 09/20/2018   Procedure: ESOPHAGOGASTRODUODENOSCOPY (EGD) WITH PROPOFOL;  Surgeon: Gatha Mayer, MD;  Location: [place];  Service: Endoscopy;  Laterality: N/A;  . HAND SURGERY  2016   right thumb  . KNEE SURGERY Left 1989     FAMILY HISTORY: Family History  Problem Relation Age of Onset  . Prostate cancer Father        dx >50  . Hypertension Mother   . Ulcers Mother   . Breast cancer Sister        dx 20's  . Prostate cancer Brother        dx under 82, metasttic, cause of his death  . Ulcers Brother   . Ulcers Sister    Peter Wood's father died from heart complications with a pacemaker at age 55; Peter Wood's father also has prostate cancer. Patients' mother died from  natural causes at age 8. The patient has 4 brothers and 3 sisters. One of Peter Wood sisters had breast cancer in her 7s and a brother had prostate cancer. Patient denies anyone in her family having ovarian, or pancreatic cancer. Peter Wood mentions that his youngest sister and his brother with prostate cancer also had stomach ulcers.    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 His wife, Peter Wood, is a retired [employer] summer Radiographer, therapeutic. As of 09/2018, they have been married for 56 years. They have 4 children, Peter Wood, Peter Wood, Peter Wood, and Peter Wood. Peter Wood lives in [place] and works at the [employer]. Peter Wood lives in [place] and works at [employer] and as a part Horticulturist, commercial. 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 Peter Wood lives in [place] and works at [employer]. Peter Wood has 7 grandchildren and 1 step great grandchild. He attends [employer].     ADVANCED DIRECTIVES: His wife, Peter Wood, is automatically his healthcare power of attorney.     HEALTH MAINTENANCE: Social History  Tobacco Use  . Smoking status: Former Smoker    Years: 4.00    Last attempt to quit: 09/12/1967    Years since quitting: 51.1  . Smokeless tobacco: Never Used  Substance Use Topics  . Alcohol use: No  . Drug use: No    Colonoscopy: 2018/[facility]  PSA: 0.54 on 09/18/2018  No Known Allergies  Current Outpatient Medications  Medication Sig Dispense Refill  . amLODipine (NORVASC) 5 MG tablet Take 1 tablet (5 mg total) daily by mouth. 90 tablet 3  . apixaban (ELIQUIS) 5 MG TABS tablet Take 1 tablet (5 mg total) by mouth 2 (two) times daily. 60 tablet 11  . benazepril (LOTENSIN) 40 MG tablet TAKE ONE TABLET BY MOUTH ONE TIME DAILY  (Patient taking differently: Take 40 mg by mouth daily. ) 90 tablet 1  . Cholecalciferol 1000 UNITS tablet Take 1,000 Units by mouth daily.     Marland Kitchen latanoprost (XALATAN) 0.005 % ophthalmic solution Place 1 drop into both  eyes at bedtime.     . naproxen sodium (ALEVE) 220 MG tablet Take 220-440 mg by mouth 2 (two) times daily as needed (for pain).    Marland Kitchen omeprazole (PRILOSEC) 40 MG capsule Take 1 capsule (40 mg total) by mouth daily. 90 capsule 3  . ondansetron (ZOFRAN) 4 MG tablet Take 1 tablet (4 mg total) by mouth every 8 (eight) hours as needed for nausea or vomiting. 20 tablet 1  . pantoprazole (PROTONIX) 40 MG tablet Take 1 tablet (40 mg total) by mouth 2 (two) times daily before a meal. 60 tablet 0  . prochlorperazine (COMPAZINE) 10 MG tablet Take 1 tablet (10 mg total) by mouth every 6 (six) hours as needed (Nausea or vomiting). 30 tablet 1   No current facility-administered medications for this visit.      OBJECTIVE: Older African-American man who appears stated age  19:   10/16/18 0959  BP: 127/72  Pulse: 77  Resp: 16  Temp: 98.6 F (37 C)  SpO2: 100%     Body mass index is 23.66 kg/m.   Wt Readings from Last 3 Encounters:  10/16/18 140 lb (63.5 kg)  10/04/18 141 lb 8 oz (64.2 kg)  10/01/18 147 lb (66.7 kg)      ECOG FS:2 - Symptomatic, <50% confined to bed GENERAL: Patient is a well appearing older male in no acute distress HEENT:  Sclerae anicteric.  Oropharynx clear and moist. No ulcerations or evidence of oropharyngeal candidiasis. Neck is supple.  NODES:  No cervical, supraclavicular, or axillary lymphadenopathy palpated.  LUNGS:  Clear to auscultation bilaterally.  No wheezes or rhonchi. HEART:  Regular rate and rhythm. No murmur appreciated. ABDOMEN:  Soft, nontender.  Positive, normoactive bowel sounds. No organomegaly palpated. MSK:  No focal spinal tenderness to palpation.  EXTREMITIES:  No peripheral edema.   SKIN:  Clear with no obvious rashes or skin changes.  NEURO:  Nonfocal. Well oriented.  Appropriate affect.    LAB RESULTS:  CMP     Component Value Date/Time   NA 134 (L) 10/04/2018 1510   K 4.2 10/04/2018 1510   CL 99 10/04/2018 1510   CO2 24 10/04/2018  1510   GLUCOSE 76 10/04/2018 1510   GLUCOSE 105 (H) 09/13/2006 0900   BUN 16 10/04/2018 1510   CREATININE 1.03 10/04/2018 1510   CALCIUM 9.0 10/04/2018 1510   PROT 7.2 10/04/2018 1510   ALBUMIN 3.0 (L) 10/04/2018 1510   AST 65 (H) 10/04/2018 1510   ALT 69 (  H) 10/04/2018 1510   ALKPHOS 230 (H) 10/04/2018 1510   BILITOT 1.2 10/04/2018 1510   GFRNONAA >60 10/04/2018 1510   GFRAA >60 10/04/2018 1510    No results found for: TOTALPROTELP, ALBUMINELP, A1GS, A2GS, BETS, BETA2SER, GAMS, MSPIKE, SPEI  No results found for: KPAFRELGTCHN, LAMBDASER, [facility]  Lab Results  Component Value Date   WBC 3.3 (L) 10/16/2018   NEUTROABS 1.9 10/16/2018   HGB 7.5 (L) 10/16/2018   HCT 23.4 (L) 10/16/2018   MCV 90.0 10/16/2018   PLT 185 10/16/2018    '@LASTCHEMISTRY'$ @  No results found for: LABCA2  No components found for: [id number]  No results for input(s): INR in the last 168 hours.  No results found for: LABCA2  No results found for: DDU202  No results found for: RKY706  No results found for: CBJ628  No results found for: CA2729  No components found for: HGQUANT  No results found for: CEA1 / No results found for: CEA1   No results found for: AFPTUMOR  No results found for: CHROMOGRNA  No results found for: PSA1  Appointment on 10/16/2018  Component Date Value Ref Range Status  . WBC Count 10/16/2018 3.3* 4.0 - 10.5 K/uL Final  . RBC 10/16/2018 2.60* 4.22 - 5.81 MIL/uL Final  . Hemoglobin 10/16/2018 7.5* 13.0 - 17.0 g/dL Final  . HCT 10/16/2018 23.4* 39.0 - 52.0 % Final  . MCV 10/16/2018 90.0  80.0 - 100.0 fL Final  . MCH 10/16/2018 28.8  26.0 - 34.0 pg Final  . MCHC 10/16/2018 32.1  30.0 - 36.0 g/dL Final  . RDW 10/16/2018 14.8  11.5 - 15.5 % Final  . Platelet Count 10/16/2018 185  150 - 400 K/uL Final  . nRBC 10/16/2018 0.6* 0.0 - 0.2 % Final  . Neutrophils Relative % 10/16/2018 58  % Final  . Neutro Abs 10/16/2018 1.9  1.7 - 7.7 K/uL Final  . Lymphocytes  Relative 10/16/2018 29  % Final  . Lymphs Abs 10/16/2018 0.9  0.7 - 4.0 K/uL Final  . Monocytes Relative 10/16/2018 9  % Final  . Monocytes Absolute 10/16/2018 0.3  0.1 - 1.0 K/uL Final  . Eosinophils Relative 10/16/2018 2  % Final  . Eosinophils Absolute 10/16/2018 0.1  0.0 - 0.5 K/uL Final  . Basophils Relative 10/16/2018 1  % Final  . Basophils Absolute 10/16/2018 0.0  0.0 - 0.1 K/uL Final  . Immature Granulocytes 10/16/2018 1  % Final  . Abs Immature Granulocytes 10/16/2018 0.04  0.00 - 0.07 K/uL Final   Performed at [facility], [address] [place], [place] [address]    (this displays the last labs from the last 3 days)  No results found for: TOTALPROTELP, ALBUMINELP, A1GS, A2GS, BETS, BETA2SER, GAMS, MSPIKE, SPEI (this displays SPEP labs)  No results found for: KPAFRELGTCHN, LAMBDASER, KAPLAMBRATIO (kappa/lambda light chains)  No results found for: HGBA, HGBA2QUANT, HGBFQUANT, HGBSQUAN (Hemoglobinopathy evaluation)   No results found for: LDH  Lab Results  Component Value Date   IRON 38 (L) 09/21/2018   TIBC 251 09/21/2018   IRONPCTSAT 15 (L) 09/21/2018   (Iron and TIBC)  Lab Results  Component Value Date   FERRITIN 138 09/21/2018    Urinalysis    Component Value Date/Time   COLORURINE YELLOW 09/22/2018 [place] 09/22/2018 0541   LABSPEC 1.014 09/22/2018 0541   PHURINE 6.0 09/22/2018 [place] 09/22/2018 0541   GLUCOSEU NEGATIVE 07/20/2017 1056   HGBUR NEGATIVE  09/22/2018 [place] 09/22/2018 0541   KETONESUR 5 (A) 09/22/2018 0541   PROTEINUR NEGATIVE 09/22/2018 0541   UROBILINOGEN 0.2 07/20/2017 1056   NITRITE NEGATIVE 09/22/2018 0541   LEUKOCYTESUR NEGATIVE 09/22/2018 0541   Results for MARTAVIUS, LUSTY (MRN [id number]) as of 10/04/2018 14:06  Ref. Range 09/18/2018 10:58  CA 19-9 Latest Ref Range: <34 U/mL 67,479 (H)  CEA Latest Units: ng/mL 1,318.2 (H)      STUDIES:    Ct Chest W Contrast  Result Date: 09/19/2018 CLINICAL DATA:  Abnormal CT abdomen demonstrating hepatic metastases EXAM: CT CHEST WITH CONTRAST TECHNIQUE: Multidetector CT imaging of the chest was performed during intravenous contrast administration. Sagittal and coronal MPR images reconstructed from axial data set. CONTRAST:  8m ISOVUE-300 IOPAMIDOL (ISOVUE-300) INJECTION 61% IV COMPARISON:  CT abdomen and pelvis 09/07/2018 FINDINGS: Cardiovascular: Minimal atherosclerotic calcification aorta. Aorta normal caliber without aneurysm or dissection. Filling defects identified in RIGHT lower lobe pulmonary arteries consistent with pulmonary emboli. Remaining pulmonary arteries patent. Small pericardial effusion. Mediastinum/Nodes: Calcified mediastinal and RIGHT hilar lymph nodes. 2.5 x 1.8 cm diameter LEFT thyroid mass, Wood-attenuation. Base of cervical region otherwise unremarkable. Enlarged subcarinal lymph node 14 mm short axis image 75. No definite esophageal abnormalities. Lungs/Pleura: Minimal dependent atelectasis posterior RIGHT lower lobe. Questionable areas of scarring at lateral RIGHT middle lobe and at medial LEFT lower lobe. Calcified granulomata RIGHT upper lobe image 42 and LEFT upper lobe image 67. Noncalcified 4 mm RIGHT upper lobe nodule image 74. No acute infiltrate, pleural effusion or pneumothorax. Upper Abdomen: Numerous poorly defined hepatic masses compatible with widespread metastatic disease. Enlarged gastrohepatic ligament lymph node 15 mm short axis image 138. Questionable wall thickening of the proximal stomach versus artifact related underdistention; stomach had a similar appearance on the previous exam favor wall thickening, cannot exclude neoplasm. Probable enlarged periportal lymph nodes. Musculoskeletal: No osseous metastatic lesions. IMPRESSION: Hepatic metastatic disease. Enlarged gastrohepatic ligament, periportal, and subcarinal lymph nodes/adenopathy. Gastric wall thickening  suspicious for gastric neoplasm extending to the gastroesophageal junction, recommend endoscopy. Old granulomatous disease within chest. Filling defects in RIGHT lower lobe pulmonary arteries consistent with pulmonary embolism. Small pericardial effusion. Wood attenuation 2.4 cm diameter mass LEFT thyroid lobe, question cystic versus solid; follow-up thyroid sonography recommended. Aortic Atherosclerosis (ICD10-I70.0). Critical Value/emergent results were called by telephone at the time of interpretation on 09/19/2018 at 9:57 am to PTye SavoyNP, who verbally acknowledged these results. Electronically Signed   By: MLavonia DanaM.D.   On: 09/19/2018 10:00   Vas UKoreaLower Extremity Venous (dvt)  Result Date: 09/20/2018  Lower Venous Study Indications: Pulmonary embolism.  Performing Technologist: GOliver HumRVT  Examination Guidelines: A complete evaluation includes B-mode imaging, spectral Doppler, color Doppler, and power Doppler as needed of all accessible portions of each vessel. Bilateral testing is considered an integral part of a complete examination. Limited examinations for reoccurring indications may be performed as noted.  Right Venous Findings: +---------+---------------+---------+-----------+----------+-------+          CompressibilityPhasicitySpontaneityPropertiesSummary +---------+---------------+---------+-----------+----------+-------+ CFV      Full           Yes      Yes                          +---------+---------------+---------+-----------+----------+-------+ SFJ      Full                                                 +---------+---------------+---------+-----------+----------+-------+  FV Prox  Full                                                 +---------+---------------+---------+-----------+----------+-------+ FV Mid   Full                                                 +---------+---------------+---------+-----------+----------+-------+ FV  DistalFull                                                 +---------+---------------+---------+-----------+----------+-------+ PFV      Full                                                 +---------+---------------+---------+-----------+----------+-------+ POP      Full           Yes      Yes                          +---------+---------------+---------+-----------+----------+-------+ PTV      Full                                                 +---------+---------------+---------+-----------+----------+-------+ PERO     Full                                                 +---------+---------------+---------+-----------+----------+-------+  Left Venous Findings: +---------+---------------+---------+-----------+----------+-------+          CompressibilityPhasicitySpontaneityPropertiesSummary +---------+---------------+---------+-----------+----------+-------+ CFV      Full           Yes      Yes                          +---------+---------------+---------+-----------+----------+-------+ SFJ      Full                                                 +---------+---------------+---------+-----------+----------+-------+ FV Prox  Full                                                 +---------+---------------+---------+-----------+----------+-------+ FV Mid   Full                                                 +---------+---------------+---------+-----------+----------+-------+  FV DistalFull                                                 +---------+---------------+---------+-----------+----------+-------+ PFV      Full                                                 +---------+---------------+---------+-----------+----------+-------+ POP      Full           Yes      Yes                          +---------+---------------+---------+-----------+----------+-------+ PTV      Full                                                  +---------+---------------+---------+-----------+----------+-------+ PERO     Full                                                 +---------+---------------+---------+-----------+----------+-------+    Summary: Right: There is no evidence of deep vein thrombosis in the lower extremity. No cystic structure found in the popliteal fossa. Left: There is no evidence of deep vein thrombosis in the lower extremity. No cystic structure found in the popliteal fossa.  *See table(s) above for measurements and observations. Electronically signed by Monica Martinez MD on 09/20/2018 at 12:29:36 PM.    Final      ELIGIBLE FOR AVAILABLE RESEARCH PROTOCOL: no   ASSESSMENT: 84 y.o. [place], [place] man status post gastric biopsy 09/20/2018 showing adenocarcinoma, with a CA 19-9 greater than 65,000; staging studies showing an apparent mass in the head of the pancreas, innumerable liver lesions, upper abdominal mesenteric and peripancreatic lymphadenopathy, and an isolated sclerotic density in the left iliac bone  (a) genomics requested on biopsy material: Results pending  (1) chest CT scan 09/19/2018 shows right lower lobe pulmonary embolism  (a) on intravenous heparin started 09/19/2018, transitioned to apixaban 10/01/2018  (2) genetics testing 10/04/2018: negative  (a) family history of prostate and early breast cancer; patient has a history of prostate cancer  (3) started gemcitabine/Abraxane 10/09/2018, repeated days 1, 8 and 15 of each 28-day cycle   PLAN:  Izell Scotland met with myself and Dr. Jana Hakim today.  He tolerated his chemotherapy relatively well.  We reviewed his lab testing and his hemoglobin of 7.5.  He will need one unit of blood in the next two days, but can still proceed with treatment today with his chemotherapy.  We reviewed the risks of blood transfusion with him.    I recommended Montavis avoid Aleve when possible since he is taking eliquis.    I requested we get several other  appointments set up for his next treatments.  He was initially going to go for second opinion with [facility], but he would like to hold off on that for now.  Marlon Pel, had put in port placement orders yesterday,  I let Ebbie and his wife Peter Wood know that if they don't hear anything in the next few days to schedule it to please let us know.    Jacub will return next week for labs, f/u, and his next chemotherapy treatment.  He will call with any problems that may develop before his next visit here.  Wilber Bihari, NP 10/16/18 10:01 AM Medical Oncology and Hematology [facility] [address] [place], [place] [address] Tel. [phone]    Fax. [phone]    ADDENDUM: Mr. Mikita is tolerating his chemotherapy remarkably well.  He would like a port placed to facilitate it.  At this point they are less interested in proceeding to a second opinion at [facility].  Genetics unfortunately are not suggestive of an actionable mutation.  The plan is to continue the current treatment for 2 to 3 months and then restage.  We will recheck a CA 19 with the next treatment to assess for initial response.  I personally saw this patient and performed a substantive portion of this encounter with the listed APP documented above.   Chauncey Cruel, MD Medical Oncology and Hematology [facility] [address] [place], [place] [address] Tel. [phone]    Fax. [phone]

## 2018-10-16 NOTE — Patient Instructions (Addendum)
[facility] Discharge Instructions for Patients Receiving Chemotherapy  Today you received the following chemotherapy agents:  Abraxane and Gemzar.  To help prevent nausea and vomiting after your treatment, we encourage you to take your nausea medication as directed.   If you develop nausea and vomiting that is not controlled by your nausea medication, call the clinic.   BELOW ARE SYMPTOMS THAT SHOULD BE REPORTED IMMEDIATELY:  *FEVER GREATER THAN 100.5 F  *CHILLS WITH OR WITHOUT FEVER  NAUSEA AND VOMITING THAT IS NOT CONTROLLED WITH YOUR NAUSEA MEDICATION  *UNUSUAL SHORTNESS OF BREATH  *UNUSUAL BRUISING OR BLEEDING  TENDERNESS IN MOUTH AND THROAT WITH OR WITHOUT PRESENCE OF ULCERS  *URINARY PROBLEMS  *BOWEL PROBLEMS  UNUSUAL RASH Items with * indicate a potential emergency and should be followed up as soon as possible.  Feel free to call the clinic should you have any questions or concerns. The clinic phone number is [phone].  Please show the [place] [facility] at check-in to the Emergency Department and triage nurse.   Blood Transfusion, Adult  A blood transfusion is a procedure in which you receive donated blood, including plasma, platelets, and red blood cells, through an IV tube. You may need a blood transfusion because of illness, surgery, or injury. The blood may come from a donor. You may also be able to donate blood for yourself (autologous blood donation) before a surgery if you know that you might require a blood transfusion. The blood given in a transfusion is made up of different types of cells. You may receive:  Red blood cells. These carry oxygen to the cells in the body.  White blood cells. These help you fight infections.  Platelets. These help your blood to clot.  Plasma. This is the liquid part of your blood and it helps with fluid imbalances. If you have hemophilia or another clotting disorder, you may also receive other types of  blood products. Tell a health care provider about:  Any allergies you have.  All medicines you are taking, including vitamins, herbs, eye drops, creams, and over-the-counter medicines.  Any problems you or family members have had with anesthetic medicines.  Any blood disorders you have.  Any surgeries you have had.  Any medical conditions you have, including any recent fever or cold symptoms.  Whether you are pregnant or may be pregnant.  Any previous reactions you have had during a blood transfusion. What are the risks? Generally, this is a safe procedure. However, problems may occur, including:  Having an allergic reaction to something in the donated blood. Hives and itching may be symptoms of this type of reaction.  Fever. This may be a reaction to the white blood cells in the transfused blood. Nausea or chest pain may accompany a fever.  Iron overload. This can happen from having many transfusions.  Transfusion-related acute lung injury (TRALI). This is a rare reaction that causes lung damage. The cause is not known.TRALI can occur within hours of a transfusion or several days later.  Sudden (acute) or delayed hemolytic reactions. This happens if your blood does not match the cells in your transfusion. Your body's defense system (immune system) may try to attack the new cells. This complication is rare. The symptoms include fever, chills, nausea, and low back pain or chest pain.  Infection or disease transmission. This is rare. What happens before the procedure?  You will have a blood test to determine your blood type. This is necessary to know what  kind of blood your body will accept and to match it to the donor blood.  If you are going to have a planned surgery, you may be able to do an autologous blood donation. This may be done in case you need to have a transfusion.  If you have had an allergic reaction to a transfusion in the past, you may be given medicine to help  prevent a reaction. This medicine may be given to you by mouth or through an IV tube.  You will have your temperature, blood pressure, and pulse monitored before the transfusion.  Follow instructions from your health care provider about eating and drinking restrictions.  Ask your health care provider about: ? Changing or stopping your regular medicines. This is especially important if you are taking diabetes medicines or blood thinners. ? Taking medicines such as aspirin and ibuprofen. These medicines can thin your blood. Do not take these medicines before your procedure if your health care provider instructs you not to. What happens during the procedure?  An IV tube will be inserted into one of your veins.  The bag of donated blood will be attached to your IV tube. The blood will then enter through your vein.  Your temperature, blood pressure, and pulse will be monitored regularly during the transfusion. This monitoring is done to detect early signs of a transfusion reaction.  If you have any signs or symptoms of a reaction, your transfusion will be stopped and you may be given medicine.  When the transfusion is complete, your IV tube will be removed.  Pressure may be applied to the IV site for a few minutes.  A bandage (dressing) will be applied. The procedure may vary among health care providers and hospitals. What happens after the procedure?  Your temperature, blood pressure, heart rate, breathing rate, and blood oxygen level will be monitored often.  Your blood may be tested to see how you are responding to the transfusion.  You may be warmed with fluids or blankets to maintain a normal body temperature. Summary  A blood transfusion is a procedure in which you receive donated blood, including plasma, platelets, and red blood cells, through an IV tube.  Your temperature, blood pressure, and pulse will be monitored before, during, and after the transfusion.  Your blood may  be tested after the transfusion to see how your body has responded. This information is not intended to replace advice given to you by your health care provider. Make sure you discuss any questions you have with your health care provider. Document Released: 08/25/2000 Document Revised: 05/25/2016 Document Reviewed: 05/25/2016 Elsevier Interactive Patient Education  [employer].

## 2018-10-16 NOTE — Progress Notes (Signed)
Dr. Jana Hakim reviewed labs - OK to proceed with port placement.  Orders placed.  IR to contact for scheduling of port placement.

## 2018-10-16 NOTE — Progress Notes (Signed)
Per Wilber Bihari NP, ok to treat with today's Hgb 7.5. Will schedule patient for 1 unit of PRBCs to be transfused.

## 2018-10-17 LAB — TYPE AND SCREEN
ABO/RH(D): O POS
Antibody Screen: NEGATIVE
Unit division: 0

## 2018-10-17 LAB — BPAM RBC
Blood Product Expiration Date: 202003022359
ISSUE DATE / TIME: 202002051349
Unit Type and Rh: 5100

## 2018-10-18 ENCOUNTER — Other Ambulatory Visit: Payer: Self-pay | Admitting: *Deleted

## 2018-10-18 ENCOUNTER — Telehealth: Payer: Self-pay | Admitting: *Deleted

## 2018-10-18 ENCOUNTER — Inpatient Hospital Stay: Payer: Medicare Other

## 2018-10-18 DIAGNOSIS — D649 Anemia, unspecified: Secondary | ICD-10-CM

## 2018-10-18 NOTE — Telephone Encounter (Signed)
This RN received call from the patient stating he does not feel like he needs another blood transfusion.  He did ask about what he could use " if my stomach starts to hurt what can I use since I shouldn't use the Advil ?"  This RN discussed with pt above including verifying he is taking Protonix and Prilosec ( on med list ).  If needed Kc will use tylenol- as well as use " foods that coat my stomach " such as yogurts and buttermilk ( he likes both of these )

## 2018-10-21 ENCOUNTER — Telehealth: Payer: Self-pay | Admitting: Nurse Practitioner

## 2018-10-21 NOTE — Telephone Encounter (Signed)
Pt;s wife called requesting prescription for stomach pain, she states that her husband was hospitalized this past January and had a procedure with Dr. Carlean Purl that revealed ulcer, she states that everything that he eats give him stomachache. Pls send prescription to [facility] on [address].

## 2018-10-21 NOTE — Telephone Encounter (Signed)
Wife is my patient and discussed with the today at [facility] I suggested he/she discuss pain management with oncology

## 2018-10-22 NOTE — Telephone Encounter (Signed)
thanks

## 2018-10-23 ENCOUNTER — Encounter: Payer: Self-pay | Admitting: Adult Health

## 2018-10-23 ENCOUNTER — Inpatient Hospital Stay: Payer: Medicare Other

## 2018-10-23 ENCOUNTER — Inpatient Hospital Stay (HOSPITAL_BASED_OUTPATIENT_CLINIC_OR_DEPARTMENT_OTHER): Payer: Medicare Other | Admitting: Adult Health

## 2018-10-23 VITALS — BP 135/76 | HR 95 | Temp 98.4°F | Resp 18 | Ht 64.5 in | Wt 135.0 lb

## 2018-10-23 DIAGNOSIS — C7951 Secondary malignant neoplasm of bone: Secondary | ICD-10-CM | POA: Diagnosis not present

## 2018-10-23 DIAGNOSIS — C169 Malignant neoplasm of stomach, unspecified: Secondary | ICD-10-CM

## 2018-10-23 DIAGNOSIS — C787 Secondary malignant neoplasm of liver and intrahepatic bile duct: Secondary | ICD-10-CM

## 2018-10-23 DIAGNOSIS — Z87891 Personal history of nicotine dependence: Secondary | ICD-10-CM

## 2018-10-23 DIAGNOSIS — C250 Malignant neoplasm of head of pancreas: Secondary | ICD-10-CM | POA: Diagnosis not present

## 2018-10-23 DIAGNOSIS — Z7901 Long term (current) use of anticoagulants: Secondary | ICD-10-CM

## 2018-10-23 DIAGNOSIS — Z86711 Personal history of pulmonary embolism: Secondary | ICD-10-CM

## 2018-10-23 DIAGNOSIS — D649 Anemia, unspecified: Secondary | ICD-10-CM

## 2018-10-23 DIAGNOSIS — Z79899 Other long term (current) drug therapy: Secondary | ICD-10-CM

## 2018-10-23 DIAGNOSIS — C799 Secondary malignant neoplasm of unspecified site: Principal | ICD-10-CM

## 2018-10-23 DIAGNOSIS — C257 Malignant neoplasm of other parts of pancreas: Secondary | ICD-10-CM

## 2018-10-23 DIAGNOSIS — Z5111 Encounter for antineoplastic chemotherapy: Secondary | ICD-10-CM | POA: Diagnosis not present

## 2018-10-23 DIAGNOSIS — Z8546 Personal history of malignant neoplasm of prostate: Secondary | ICD-10-CM

## 2018-10-23 LAB — COMPREHENSIVE METABOLIC PANEL
ALT: 55 U/L — AB (ref 0–44)
AST: 64 U/L — ABNORMAL HIGH (ref 15–41)
Albumin: 3.3 g/dL — ABNORMAL LOW (ref 3.5–5.0)
Alkaline Phosphatase: 222 U/L — ABNORMAL HIGH (ref 38–126)
Anion gap: 11 (ref 5–15)
BUN: 19 mg/dL (ref 8–23)
CHLORIDE: 102 mmol/L (ref 98–111)
CO2: 27 mmol/L (ref 22–32)
Calcium: 9.1 mg/dL (ref 8.9–10.3)
Creatinine, Ser: 1.12 mg/dL (ref 0.61–1.24)
GFR calc Af Amer: >60 mL/min (ref >60)
GFR calc non Af Amer: >60 mL/min (ref >60)
Glucose, Bld: 124 mg/dL — ABNORMAL HIGH (ref 70–99)
Potassium: 3.9 mmol/L (ref 3.5–5.1)
Sodium: 140 mmol/L (ref 135–145)
Total Bilirubin: 0.8 mg/dL (ref 0.3–1.2)
Total Protein: 6.9 g/dL (ref 6.5–8.1)

## 2018-10-23 LAB — CBC WITH DIFFERENTIAL/PLATELET
Abs Immature Granulocytes: 0.05 10*3/uL (ref 0.00–0.07)
Basophils Absolute: 0 10*3/uL (ref 0.0–0.1)
Basophils Relative: 0 %
Eosinophils Absolute: 0.1 10*3/uL (ref 0.0–0.5)
Eosinophils Relative: 2 %
HCT: 27.2 % — ABNORMAL LOW (ref 39.0–52.0)
Hemoglobin: 8.5 g/dL — ABNORMAL LOW (ref 13.0–17.0)
Immature Granulocytes: 2 %
LYMPHS ABS: 0.9 10*3/uL (ref 0.7–4.0)
LYMPHS PCT: 29 %
MCH: 28.7 pg (ref 26.0–34.0)
MCHC: 31.3 g/dL (ref 30.0–36.0)
MCV: 91.9 fL (ref 80.0–100.0)
Monocytes Absolute: 0.2 10*3/uL (ref 0.1–1.0)
Monocytes Relative: 6 %
Neutro Abs: 1.9 10*3/uL (ref 1.7–7.7)
Neutrophils Relative %: 61 %
PLATELETS: 174 10*3/uL (ref 150–400)
RBC: 2.96 MIL/uL — ABNORMAL LOW (ref 4.22–5.81)
RDW: 15.2 % (ref 11.5–15.5)
WBC: 3.1 10*3/uL — ABNORMAL LOW (ref 4.0–10.5)
nRBC: 1.6 % — ABNORMAL HIGH (ref 0.0–0.2)

## 2018-10-23 MED ORDER — PROCHLORPERAZINE MALEATE 10 MG PO TABS
ORAL_TABLET | ORAL | Status: AC
  Filled 2018-10-23: qty 1

## 2018-10-23 MED ORDER — SODIUM CHLORIDE 0.9 % IV SOLN
Freq: Once | INTRAVENOUS | Status: AC
  Administered 2018-10-23: 12:00:00 via INTRAVENOUS
  Filled 2018-10-23: qty 250

## 2018-10-23 MED ORDER — DEXAMETHASONE SODIUM PHOSPHATE 10 MG/ML IJ SOLN
INTRAMUSCULAR | Status: AC
  Filled 2018-10-23: qty 1

## 2018-10-23 MED ORDER — PACLITAXEL PROTEIN-BOUND CHEMO INJECTION 100 MG
125.0000 mg/m2 | Freq: Once | INTRAVENOUS | Status: AC
  Administered 2018-10-23: 225 mg via INTRAVENOUS
  Filled 2018-10-23: qty 45

## 2018-10-23 MED ORDER — SODIUM CHLORIDE 0.9 % IV SOLN
1000.0000 mg/m2 | Freq: Once | INTRAVENOUS | Status: AC
  Administered 2018-10-23: 1710 mg via INTRAVENOUS
  Filled 2018-10-23: qty 44.97

## 2018-10-23 MED ORDER — DEXAMETHASONE SODIUM PHOSPHATE 10 MG/ML IJ SOLN
4.0000 mg | Freq: Once | INTRAMUSCULAR | Status: AC
  Administered 2018-10-23: 4 mg via INTRAVENOUS

## 2018-10-23 MED ORDER — PROCHLORPERAZINE MALEATE 10 MG PO TABS
10.0000 mg | ORAL_TABLET | Freq: Once | ORAL | Status: AC
  Administered 2018-10-23: 10 mg via ORAL

## 2018-10-23 MED ORDER — SODIUM CHLORIDE 0.9% FLUSH
10.0000 mL | INTRAVENOUS | Status: DC | PRN
  Filled 2018-10-23: qty 10

## 2018-10-23 NOTE — Progress Notes (Signed)
[facility]  [phone] [phone]) [phone]     ID: Peter Wood DOB: [birth year]  MR#: [id number]  DZH#:[id number]  Patient Care Team: Cassandria Anger, MD as PCP - General Milus Banister, MD as Attending Physician (Gastroenterology) Magrinat, Virgie Dad, MD as Consulting Physician (Oncology) Gatha Mayer, MD as Consulting Physician (Gastroenterology) [name], MD as Consulting Physician (Ophthalmology) Ramon Dredge, MD as Referring Physician (Radiation Oncology) OTHER MD:   CHIEF COMPLAINT: Metastatic pancreatic adenocarcinoma  CURRENT TREATMENT: Gemcitabine, liposomal paclitaxel (Abraxane)   HISTORY OF CURRENT ILLNESS: Peter Wood presented to the emergency room 09/07/2018 with mid/upper abdominal pain.  He reported weight loss of 25 pounds over the past 6 months and significant anorexia.  His liver function tests were abnormal and a right upper quadrant ultrasound was obtained.  This was read as concerning for malignancy and a CT of the abdomen and pelvis with contrast was obtained the same day, showing  1. Too numerous to count hypodense masses of the liver concerning for hepatic metastasis. In the region of the porta hepatis, there are hypodense lesions more likely associated with the liver though the pancreatic head is partially obscured by a 3.2 cm hypodense mass. Findings are more likely associated with the liver and less likely an isolated pancreatic mass. 2. Upper abdominal mesenteric and peripancreatic lymphadenopathy suspicious for metastatic disease. 3. 6 mm sclerotic density in the left iliac bone adjacent to the SI joint. Given history of prostate cancer, differential considerations would include osteoblastic metastasis or possibly bone island. Showed only diverticular disease.  He was referred to GI and Dr. Ardis Hughs notes a colonoscopy performed 04/23/2017 makes the possibility of colon cancer unlikely.  Further  work-up was planned, but on 09/19/2018 the patient again presented to the emergency room with chest pain, now in a different area, and a staging CT scan of the chest showed filling defects in the right lower lobe pulmonary arteries consistent with embolism.  There was a small pericardial effusion and an additional 2.4 cm left thyroid mass.  Aortic atherosclerosis was noted  He was started on intravenous heparin and on 09/20/2018 underwent upper endoscopy under Dr. Carlean Purl showing a giant nonbleeding cratered gastric ulcer in the posterior wall of the stomach.  Biopsies of this procedure showed (SZA 20-187) adenocarcinoma.    At that point we were consulted and we obtained tumor markers which included a CEA of 1318.2, a CA 19-9 of 67,479, a normal AFP and a normal PSA.  I discussed the case with pathology and they do not feel that additional testing would be helpful in terms of discriminating between a primary gastric and a primary biliary/pancreatic tumor.  The patient's subsequent history is as detailed below.   INTERVAL HISTORY: Peter Wood is here today accompanied by his wife for his newly diagnosed metastatic pancreatic cancer.  He is receiving treatment with Gemcitabine and Abraxane.  He receives this on days 1, 8 and 15 of a 28 day cycle.  Today is cycle 1 day 15 of treatment.  On day 8 of his treatment he also received on unit of blood for anemia.  He has not yet had labs drawn to determine how well he responded.  He denies any peripheral neuropathy.  He is going to undergo port placement on 11/04/2018.  REVIEW OF SYSTEMS: [name] has had a couple of episodes of nausea.  His weight is down 5 pounds since last week.  He notes that there were two days that  he didn't eat anything at all, partly due to nausea.  I asked him what he did when he was nauseated.  He told me that he would take a pantoprazole and that it didn't help.  He then said that he might have taken an omeprazole.  His wife told me that  she thinks he might have taken Compazine and not the Pantoprazole.  They are unsure which medications he has and which medications he has been taking.  He is drinking boost when he can, and since yesterday his appetite has returned.    Peter Wood denies fevers, chills, or vision changes.  He had one episode of dizziness yesterday.  He is drinking 24 ounces of water per day at the most.  He denies dizziness today.  He denies chest pain, cough, shortness of breath, or palpitations.  He is without headaches.  He denies any abdominal pain, vomiting, constipation, or diarrhea.  A detailed ROS was otherwise non contributory.     PAST MEDICAL HISTORY: Past Medical History:  Diagnosis Date  . Arthritis   . Diverticulosis of colon   . Family history of breast cancer   . Family history of prostate cancer   . HTN (hypertension)   . Hx of colonic polyp   . Pneumonia    as a child/baby  . Poor circulation of extremity    right leg  . Prostate cancer ([facility]) 2015   prostate cancer - radiation treated     PAST SURGICAL HISTORY: Past Surgical History:  Procedure Laterality Date  . [facility] VITRECTOMY WITH 20 GAUGE MVR PORT FOR MACULAR HOLE Right 09/19/2016   Procedure: 25 GAUGE PARS PLANA VITRECTOMY WITH 20 GAUGE MVR PORT FOR MACULAR HOLE, MEMBRANE PEEL, SERUM PATCH, GAS FLUID EXCHANGE, HEADSCOPE LASER;  Surgeon: [name], MD;  Location: [facility];  Service: Ophthalmology;  Laterality: Right;  . BIOPSY  09/20/2018   Procedure: BIOPSY;  Surgeon: Gatha Mayer, MD;  Location[facility];  Service: Endoscopy;;  . CATARACT EXTRACTION  08/27/12   Right  . COLONOSCOPY    . ESOPHAGOGASTRODUODENOSCOPY (EGD) WITH PROPOFOL N/A 09/20/2018   Procedure: ESOPHAGOGASTRODUODENOSCOPY (EGD) WITH PROPOFOL;  Surgeon: Gatha Mayer, MD;  Location: [facility];  Service: Endoscopy;  Laterality: N/A;  . HAND SURGERY  2016   right thumb  . KNEE SURGERY Left 1989     FAMILY HISTORY: Family History    Problem Relation Age of Onset  . Prostate cancer Father        dx >50  . Hypertension Mother   . Ulcers Mother   . Breast cancer Sister        dx 20's  . Prostate cancer Brother        dx under 11, metasttic, cause of his death  . Ulcers Brother   . Ulcers Sister    Mabry's father died from heart complications with a pacemaker at age 37; Sargent's father also has prostate cancer. Patients' mother died from natural causes at age 58. The patient has 4 brothers and 3 sisters. One of [place] sisters had breast cancer in her 43s and a brother had prostate cancer. Patient denies anyone in her family having ovarian, or pancreatic cancer. Luis mentions that his youngest sister and his brother with prostate cancer also had stomach ulcers.    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 His wife, [name], is a retired [employer] summer Radiographer, therapeutic. As of 09/2018, they have been married for 56 years.  They have 4 children, Claiborne Billings, [place], [place], and [place]. Claiborne Billings lives in [place] and works at the [employer]. Sherrod lives in [place] and works at [employer] and as a part Horticulturist, commercial. 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 Beverely Low lives in [place] and works at [employer]. Jedi has 7 grandchildren and 1 step great grandchild. He attends [employer].     ADVANCED DIRECTIVES: His wife, [name], is automatically his healthcare power of attorney.     HEALTH MAINTENANCE: Social History   Tobacco Use  . Smoking status: Former Smoker    Years: 4.00    Last attempt to quit: 09/12/1967    Years since quitting: 51.1  . Smokeless tobacco: Never Used  Substance Use Topics  . Alcohol use: No  . Drug use: No    Colonoscopy: 2018/[facility]  PSA: 0.54 on 09/18/2018  No Known Allergies  Current Outpatient Medications  Medication Sig Dispense Refill  . amLODipine (NORVASC) 5 MG tablet Take 1 tablet (5 mg total) daily by mouth. 90  tablet 3  . apixaban (ELIQUIS) 5 MG TABS tablet Take 1 tablet (5 mg total) by mouth 2 (two) times daily. 60 tablet 11  . benazepril (LOTENSIN) 40 MG tablet TAKE ONE TABLET BY MOUTH ONE TIME DAILY  (Patient taking differently: Take 40 mg by mouth daily. ) 90 tablet 1  . Cholecalciferol 1000 UNITS tablet Take 1,000 Units by mouth daily.     Marland Kitchen latanoprost (XALATAN) 0.005 % ophthalmic solution Place 1 drop into both eyes at bedtime.     . naproxen sodium (ALEVE) 220 MG tablet Take 220-440 mg by mouth 2 (two) times daily as needed (for pain).    . ondansetron (ZOFRAN) 4 MG tablet Take 1 tablet (4 mg total) by mouth every 8 (eight) hours as needed for nausea or vomiting. 20 tablet 1  . pantoprazole (PROTONIX) 40 MG tablet Take 1 tablet (40 mg total) by mouth 2 (two) times daily before a meal. 60 tablet 0  . prochlorperazine (COMPAZINE) 10 MG tablet Take 1 tablet (10 mg total) by mouth every 6 (six) hours as needed (Nausea or vomiting). 30 tablet 1   No current facility-administered medications for this visit.      OBJECTIVE:  Vitals:   10/23/18 0956  BP: 135/76  Pulse: 95  Resp: 18  Temp: 98.4 F (36.9 C)  SpO2: 100%     Body mass index is 22.81 kg/m.   Wt Readings from Last 3 Encounters:  10/23/18 135 lb (61.2 kg)  10/16/18 140 lb (63.5 kg)  10/04/18 141 lb 8 oz (64.2 kg)  ECOG FS:2 - Symptomatic, <50% confined to bed GENERAL: Patient is a well appearing older male in no acute distress HEENT:  Sclerae anicteric.  Oropharynx clear and moist. No ulcerations or evidence of oropharyngeal candidiasis. Neck is supple.  NODES:  No cervical, supraclavicular, or axillary lymphadenopathy palpated.  LUNGS:  Clear to auscultation bilaterally.  No wheezes or rhonchi. HEART:  Regular rate and rhythm. No murmur appreciated. ABDOMEN:  Soft, nontender.  Positive, normoactive bowel sounds. No organomegaly palpated. MSK:  No focal spinal tenderness to palpation.  EXTREMITIES:  No peripheral edema.     SKIN:  Clear with no obvious rashes or skin changes.  NEURO:  Nonfocal. Well oriented.  Appropriate affect.    LAB RESULTS:  CMP     Component Value Date/Time   NA 141 10/16/2018 0936   K 4.0 10/16/2018  0936   CL 105 10/16/2018 0936   CO2 28 10/16/2018 0936   GLUCOSE 132 (H) 10/16/2018 0936   GLUCOSE 105 (H) 09/13/2006 0900   BUN 19 10/16/2018 0936   CREATININE 1.02 10/16/2018 0936   CALCIUM 9.3 10/16/2018 0936   PROT 6.7 10/16/2018 0936   ALBUMIN 3.1 (L) 10/16/2018 0936   AST 76 (H) 10/16/2018 0936   ALT 47 (H) 10/16/2018 0936   ALKPHOS 289 (H) 10/16/2018 0936   BILITOT 0.7 10/16/2018 0936   GFRNONAA >60 10/16/2018 0936   GFRAA >60 10/16/2018 0936    No results found for: TOTALPROTELP, ALBUMINELP, A1GS, A2GS, BETS, BETA2SER, GAMS, MSPIKE, SPEI  No results found for: KPAFRELGTCHN, LAMBDASER, [facility]  Lab Results  Component Value Date   WBC 3.3 (L) 10/16/2018   NEUTROABS 1.9 10/16/2018   HGB 7.5 (L) 10/16/2018   HCT 23.4 (L) 10/16/2018   MCV 90.0 10/16/2018   PLT 185 10/16/2018    @LASTCHEMISTRY @  No results found for: LABCA2  No components found for: [id number]  No results for input(s): INR in the last 168 hours.  No results found for: LABCA2  No results found for: GXQ119  No results found for: ERD408  No results found for: XKG818  No results found for: CA2729  No components found for: HGQUANT  No results found for: CEA1 / No results found for: CEA1   No results found for: AFPTUMOR  No results found for: CHROMOGRNA  No results found for: PSA1  No visits with results within 3 Day(s) from this visit.  Latest known visit with results is:  Infusion on 10/16/2018  Component Date Value Ref Range Status  . Order Confirmation 10/16/2018    Final                   Value:ORDER PROCESSED BY BLOOD BANK Performed at [facility], [place] [address], [place], [place] [address]   . ABO/RH(D) 10/16/2018    Final                    Value:O POS Performed at [facility], [address] [place], [place] [address]     (this displays the last labs from the last 3 days)  No results found for: TOTALPROTELP, ALBUMINELP, A1GS, A2GS, BETS, BETA2SER, GAMS, MSPIKE, SPEI (this displays SPEP labs)  No results found for: KPAFRELGTCHN, LAMBDASER, KAPLAMBRATIO (kappa/lambda light chains)  No results found for: HGBA, HGBA2QUANT, HGBFQUANT, HGBSQUAN (Hemoglobinopathy evaluation)   No results found for: LDH  Lab Results  Component Value Date   IRON 38 (L) 09/21/2018   TIBC 251 09/21/2018   IRONPCTSAT 15 (L) 09/21/2018   (Iron and TIBC)  Lab Results  Component Value Date   FERRITIN 138 09/21/2018    Urinalysis    Component Value Date/Time   COLORURINE YELLOW 09/22/2018 [place] 09/22/2018 0541   LABSPEC 1.014 09/22/2018 [place] 6.0 09/22/2018 [place] 09/22/2018 [place] 07/20/2017 [place] 09/22/2018 [place] 09/22/2018 0541   KETONESUR 5 (A) 09/22/2018 0541   PROTEINUR NEGATIVE 09/22/2018 0541   UROBILINOGEN 0.2 07/20/2017 1056   NITRITE NEGATIVE 09/22/2018 0541   LEUKOCYTESUR NEGATIVE 09/22/2018 0541   Results for ROMON, DEVEREUX (MRN [id number]) as of 10/04/2018 14:06  Ref. Range 09/18/2018 10:58  CA 19-9 Latest Ref Range: <34 U/mL 67,479 (H)  CEA Latest Units: ng/mL 1,318.2 (H)  STUDIES:  No results found.   ELIGIBLE FOR AVAILABLE RESEARCH PROTOCOL: no   ASSESSMENT: 84 y.o. [place], [place] man status post gastric biopsy 09/20/2018 showing adenocarcinoma, with a CA 19-9 greater than 65,000; staging studies showing an apparent mass in the head of the pancreas, innumerable liver lesions, upper abdominal mesenteric and peripancreatic lymphadenopathy, and an isolated sclerotic density in the left iliac bone  (a) genomics requested on biopsy material: Results pending  (1) chest CT scan  09/19/2018 shows right lower lobe pulmonary embolism  (a) on intravenous heparin started 09/19/2018, transitioned to apixaban 10/01/2018  (2) genetics testing 10/04/2018: negative  (a) family history of prostate and early breast cancer; patient has a history of prostate cancer  (3) started gemcitabine/Abraxane 10/09/2018, repeated days 1, 8 and 15 of each 28-day cycle   PLAN:  Christion is doing moderately well today.  His labs are pending, so he will proceed with treatment today with Gemcitabine and Abraxane, so long as they are within parameters.  He has not developed any peripheral neuropathy, which we are monitoring closely for.  I spent 10-15 minutes discussing Kevon's nausea medication and his medication list.  They are unsure what he is taking.  I let them know that they really need to be aware of his medications, and ensure that he is taking them as prescribed as they can help alleviate his nausea when taken correctly.  This of course will help him eat and not lose so much weight.  His wife plans on getting his bottles out and looking into what he is taking when they get home today.  Due to his weight loss I encouraged continued intake of Boost. I also placed a referral for him to meet with our nutritionist at his next visit.  Xayvier will return in two weeks for labs, f/u, and his next chemotherapy treatment.  He will call with any problems that may develop before his next visit here.  A total of (30) minutes of face-to-face time was spent with this patient with greater than 50% of that time in counseling and care-coordination.   Wilber Bihari, NP 10/23/18 10:23 AM Medical Oncology and Hematology [facility] [address] [place], [place] [address] Tel. [phone]    Fax. [phone]

## 2018-10-23 NOTE — Patient Instructions (Signed)
[facility] Discharge Instructions for Patients Receiving Chemotherapy  Today you received the following chemotherapy agents:  Abraxane and Gemzar.  To help prevent nausea and vomiting after your treatment, we encourage you to take your nausea medication as directed.   If you develop nausea and vomiting that is not controlled by your nausea medication, call the clinic.   BELOW ARE SYMPTOMS THAT SHOULD BE REPORTED IMMEDIATELY:  *FEVER GREATER THAN 100.5 F  *CHILLS WITH OR WITHOUT FEVER  NAUSEA AND VOMITING THAT IS NOT CONTROLLED WITH YOUR NAUSEA MEDICATION  *UNUSUAL SHORTNESS OF BREATH  *UNUSUAL BRUISING OR BLEEDING  TENDERNESS IN MOUTH AND THROAT WITH OR WITHOUT PRESENCE OF ULCERS  *URINARY PROBLEMS  *BOWEL PROBLEMS  UNUSUAL RASH Items with * indicate a potential emergency and should be followed up as soon as possible.  Feel free to call the clinic should you have any questions or concerns. The clinic phone number is [phone].  Please show the [facility] at check-in to the Emergency Department and triage nurse.   Blood Transfusion, Adult  A blood transfusion is a procedure in which you receive donated blood, including plasma, platelets, and red blood cells, through an IV tube. You may need a blood transfusion because of illness, surgery, or injury. The blood may come from a donor. You may also be able to donate blood for yourself (autologous blood donation) before a surgery if you know that you might require a blood transfusion. The blood given in a transfusion is made up of different types of cells. You may receive:  Red blood cells. These carry oxygen to the cells in the body.  White blood cells. These help you fight infections.  Platelets. These help your blood to clot.  Plasma. This is the liquid part of your blood and it helps with fluid imbalances. If you have hemophilia or another clotting disorder, you may also receive other types of  blood products. Tell a health care provider about:  Any allergies you have.  All medicines you are taking, including vitamins, herbs, eye drops, creams, and over-the-counter medicines.  Any problems you or family members have had with anesthetic medicines.  Any blood disorders you have.  Any surgeries you have had.  Any medical conditions you have, including any recent fever or cold symptoms.  Whether you are pregnant or may be pregnant.  Any previous reactions you have had during a blood transfusion. What are the risks? Generally, this is a safe procedure. However, problems may occur, including:  Having an allergic reaction to something in the donated blood. Hives and itching may be symptoms of this type of reaction.  Fever. This may be a reaction to the white blood cells in the transfused blood. Nausea or chest pain may accompany a fever.  Iron overload. This can happen from having many transfusions.  Transfusion-related acute lung injury (TRALI). This is a rare reaction that causes lung damage. The cause is not known.TRALI can occur within hours of a transfusion or several days later.  Sudden (acute) or delayed hemolytic reactions. This happens if your blood does not match the cells in your transfusion. Your body's defense system (immune system) may try to attack the new cells. This complication is rare. The symptoms include fever, chills, nausea, and low back pain or chest pain.  Infection or disease transmission. This is rare. What happens before the procedure?  You will have a blood test to determine your blood type. This is necessary to know what  kind of blood your body will accept and to match it to the donor blood.  If you are going to have a planned surgery, you may be able to do an autologous blood donation. This may be done in case you need to have a transfusion.  If you have had an allergic reaction to a transfusion in the past, you may be given medicine to help  prevent a reaction. This medicine may be given to you by mouth or through an IV tube.  You will have your temperature, blood pressure, and pulse monitored before the transfusion.  Follow instructions from your health care provider about eating and drinking restrictions.  Ask your health care provider about: ? Changing or stopping your regular medicines. This is especially important if you are taking diabetes medicines or blood thinners. ? Taking medicines such as aspirin and ibuprofen. These medicines can thin your blood. Do not take these medicines before your procedure if your health care provider instructs you not to. What happens during the procedure?  An IV tube will be inserted into one of your veins.  The bag of donated blood will be attached to your IV tube. The blood will then enter through your vein.  Your temperature, blood pressure, and pulse will be monitored regularly during the transfusion. This monitoring is done to detect early signs of a transfusion reaction.  If you have any signs or symptoms of a reaction, your transfusion will be stopped and you may be given medicine.  When the transfusion is complete, your IV tube will be removed.  Pressure may be applied to the IV site for a few minutes.  A bandage (dressing) will be applied. The procedure may vary among health care providers and hospitals. What happens after the procedure?  Your temperature, blood pressure, heart rate, breathing rate, and blood oxygen level will be monitored often.  Your blood may be tested to see how you are responding to the transfusion.  You may be warmed with fluids or blankets to maintain a normal body temperature. Summary  A blood transfusion is a procedure in which you receive donated blood, including plasma, platelets, and red blood cells, through an IV tube.  Your temperature, blood pressure, and pulse will be monitored before, during, and after the transfusion.  Your blood may  be tested after the transfusion to see how your body has responded. This information is not intended to replace advice given to you by your health care provider. Make sure you discuss any questions you have with your health care provider. Document Released: 08/25/2000 Document Revised: 05/25/2016 Document Reviewed: 05/25/2016 Elsevier Interactive Patient Education  [employer].

## 2018-10-24 ENCOUNTER — Telehealth: Payer: Self-pay | Admitting: Adult Health

## 2018-10-24 ENCOUNTER — Other Ambulatory Visit: Payer: Self-pay | Admitting: Oncology

## 2018-10-24 DIAGNOSIS — C168 Malignant neoplasm of overlapping sites of stomach: Secondary | ICD-10-CM

## 2018-10-24 DIAGNOSIS — C787 Secondary malignant neoplasm of liver and intrahepatic bile duct: Secondary | ICD-10-CM

## 2018-10-24 LAB — CANCER ANTIGEN 19-9: CA 19-9: 75452 U/mL — ABNORMAL HIGH (ref 0–35)

## 2018-10-24 NOTE — Progress Notes (Signed)
Peter Wood cancer is HER-2 positive.  It is also PDL 1+.  I have discussed this with our GI tumor group.  They feel in gastric cancer there is data for her to treatment and and second line for pembrolizumab or similar treatment.  Accordingly I am adding trastuzumab to his chemotherapy.  Dr. Learta Codding also suggested we could move the chemotherapy to every 2 weeks if they would help the patient tolerated better

## 2018-10-24 NOTE — Telephone Encounter (Signed)
No los °

## 2018-10-28 ENCOUNTER — Inpatient Hospital Stay: Payer: Medicare Other | Admitting: Nutrition

## 2018-10-28 NOTE — Progress Notes (Signed)
84 year old male diagnosed with metastatic pancreas cancer.  He is a patient of Dr. Jana Hakim.  Past medical history includes prostate cancer with radiation therapy, diverticulitis, and hypertension.  Medications include Zofran, Protonix, and Compazine.  Labs include glucose 124 and albumin 3.3 on February 12.  Height: 64.5 inches. Weight: 135 pounds February 12. Usual body weight: 161 pounds in November 2019. BMI: 22.81.  Patient endorses 25-30 pounds weight loss. He reports anorexia and early satiety. He experienced experiences nausea with food odors and the sight of food. He is able to drink boost plus without difficulty. He is interested in trying to increase his oral intake as well as build strength. Patient reports he did not feel well this morning when he first woke up. Nutrition focused physical exam was deferred.  Nutrition diagnosis:  Unintended weight loss related to metastatic pancreas cancer and associated treatments as evidenced by 16% weight loss over 3 months which is significant.  Intervention: Patient was educated to take nausea medications as prescribed by MD. Encourage small frequent meals and snacks with high-calorie high-protein foods as tolerated. Reviewed strategies for improving nausea. Educated patient on strategies for taste alterations. Fact sheets were given.  Samples were provided.  Questions were answered and teach back method used.  Monitoring, evaluation, goals: Patient will tolerate increased calories and protein to minimize further weight loss.  Next visit: Wednesday, March 4 during infusion.  **Disclaimer: This note was dictated with voice recognition software. Similar sounding words can inadvertently be transcribed and this note may contain transcription errors which may not have been corrected upon publication of note.**

## 2018-10-30 ENCOUNTER — Encounter (HOSPITAL_COMMUNITY): Payer: Self-pay | Admitting: Oncology

## 2018-10-31 ENCOUNTER — Other Ambulatory Visit: Payer: Self-pay | Admitting: Radiology

## 2018-10-31 ENCOUNTER — Other Ambulatory Visit: Payer: Self-pay | Admitting: Oncology

## 2018-10-31 DIAGNOSIS — C787 Secondary malignant neoplasm of liver and intrahepatic bile duct: Secondary | ICD-10-CM

## 2018-10-31 DIAGNOSIS — C799 Secondary malignant neoplasm of unspecified site: Principal | ICD-10-CM

## 2018-10-31 DIAGNOSIS — C168 Malignant neoplasm of overlapping sites of stomach: Secondary | ICD-10-CM

## 2018-10-31 DIAGNOSIS — C169 Malignant neoplasm of stomach, unspecified: Secondary | ICD-10-CM

## 2018-10-31 MED ORDER — PROCHLORPERAZINE MALEATE 10 MG PO TABS: 10.0000 mg | ORAL_TABLET | Freq: Four times a day (QID) | ORAL | 1 refills | Status: DC | PRN

## 2018-10-31 NOTE — Progress Notes (Signed)
START OFF PATHWAY REGIMEN - Gastroesophageal   Custom Intervention:Medical: [Capecitabine, Carboplatin]:     Capecitabine      Carboplatin   **Always confirm dose/schedule in your pharmacy ordering system**  Patient Characteristics: Distant Metastases (cM1/pM1) / Locally Recurrent Disease, Adenocarcinoma - Esophageal, [place], and Gastric, First Line, HER2 Positive Histology: Adenocarcinoma Disease Classification: Gastric Therapeutic Status: Distant Metastases (No Additional Staging) Line of Therapy: First Line HER2 Status: Positive Intent of Therapy: Non-Curative / Palliative Intent, Discussed with Patient

## 2018-11-01 ENCOUNTER — Other Ambulatory Visit: Payer: Self-pay | Admitting: Physician Assistant

## 2018-11-04 ENCOUNTER — Ambulatory Visit (HOSPITAL_COMMUNITY)
Admission: RE | Admit: 2018-11-04 | Discharge: 2018-11-04 | Disposition: A | Discharge status: Home / Self Care | Payer: Medicare Other | Source: Ambulatory Visit | Attending: Oncology | Admitting: Oncology

## 2018-11-04 ENCOUNTER — Encounter: Payer: Self-pay | Admitting: Pharmacist

## 2018-11-04 ENCOUNTER — Encounter (HOSPITAL_COMMUNITY): Payer: Self-pay

## 2018-11-04 ENCOUNTER — Other Ambulatory Visit: Payer: Self-pay

## 2018-11-04 ENCOUNTER — Telehealth: Payer: Self-pay | Admitting: Pharmacist

## 2018-11-04 DIAGNOSIS — Z87891 Personal history of nicotine dependence: Secondary | ICD-10-CM | POA: Diagnosis not present

## 2018-11-04 DIAGNOSIS — Z803 Family history of malignant neoplasm of breast: Secondary | ICD-10-CM | POA: Insufficient documentation

## 2018-11-04 DIAGNOSIS — C169 Malignant neoplasm of stomach, unspecified: Secondary | ICD-10-CM | POA: Insufficient documentation

## 2018-11-04 DIAGNOSIS — Z79899 Other long term (current) drug therapy: Secondary | ICD-10-CM | POA: Insufficient documentation

## 2018-11-04 DIAGNOSIS — Z8249 Family history of ischemic heart disease and other diseases of the circulatory system: Secondary | ICD-10-CM | POA: Insufficient documentation

## 2018-11-04 DIAGNOSIS — M1990 Unspecified osteoarthritis, unspecified site: Secondary | ICD-10-CM | POA: Diagnosis not present

## 2018-11-04 DIAGNOSIS — I10 Essential (primary) hypertension: Secondary | ICD-10-CM | POA: Diagnosis not present

## 2018-11-04 DIAGNOSIS — C168 Malignant neoplasm of overlapping sites of stomach: Secondary | ICD-10-CM

## 2018-11-04 DIAGNOSIS — C787 Secondary malignant neoplasm of liver and intrahepatic bile duct: Secondary | ICD-10-CM | POA: Diagnosis present

## 2018-11-04 DIAGNOSIS — C257 Malignant neoplasm of other parts of pancreas: Secondary | ICD-10-CM | POA: Insufficient documentation

## 2018-11-04 DIAGNOSIS — Z7901 Long term (current) use of anticoagulants: Secondary | ICD-10-CM | POA: Insufficient documentation

## 2018-11-04 DIAGNOSIS — Z8042 Family history of malignant neoplasm of prostate: Secondary | ICD-10-CM | POA: Diagnosis not present

## 2018-11-04 DIAGNOSIS — C799 Secondary malignant neoplasm of unspecified site: Principal | ICD-10-CM

## 2018-11-04 HISTORY — PX: IR IMAGING GUIDED PORT INSERTION: IMG5740

## 2018-11-04 LAB — CBC WITH DIFFERENTIAL/PLATELET
Abs Immature Granulocytes: 0.12 10*3/uL — ABNORMAL HIGH (ref 0.00–0.07)
Basophils Absolute: 0 10*3/uL (ref 0.0–0.1)
Basophils Relative: 1 %
Eosinophils Absolute: 0.1 10*3/uL (ref 0.0–0.5)
Eosinophils Relative: 2 %
HCT: 26.8 % — ABNORMAL LOW (ref 39.0–52.0)
Hemoglobin: 8 g/dL — ABNORMAL LOW (ref 13.0–17.0)
Immature Granulocytes: 3 %
Lymphocytes Relative: 31 %
Lymphs Abs: 1.3 10*3/uL (ref 0.7–4.0)
MCH: 29.5 pg (ref 26.0–34.0)
MCHC: 29.9 g/dL — ABNORMAL LOW (ref 30.0–36.0)
MCV: 98.9 fL (ref 80.0–100.0)
MONO ABS: 0.5 10*3/uL (ref 0.1–1.0)
Monocytes Relative: 12 %
NEUTROS ABS: 2.3 10*3/uL (ref 1.7–7.7)
Neutrophils Relative %: 51 %
Platelets: 395 10*3/uL (ref 150–400)
RBC: 2.71 MIL/uL — AB (ref 4.22–5.81)
RDW: 18.1 % — ABNORMAL HIGH (ref 11.5–15.5)
WBC: 4.4 10*3/uL (ref 4.0–10.5)
nRBC: 0.5 % — ABNORMAL HIGH (ref 0.0–0.2)

## 2018-11-04 LAB — COMPREHENSIVE METABOLIC PANEL
ALT: 53 U/L — ABNORMAL HIGH (ref 0–44)
AST: 56 U/L — ABNORMAL HIGH (ref 15–41)
Albumin: 3.5 g/dL (ref 3.5–5.0)
Alkaline Phosphatase: 135 U/L — ABNORMAL HIGH (ref 38–126)
Anion gap: 8 (ref 5–15)
BUN: 13 mg/dL (ref 8–23)
CO2: 26 mmol/L (ref 22–32)
Calcium: 8.7 mg/dL — ABNORMAL LOW (ref 8.9–10.3)
Chloride: 105 mmol/L (ref 98–111)
Creatinine, Ser: 0.86 mg/dL (ref 0.61–1.24)
GFR calc non Af Amer: >60 mL/min (ref >60)
Glucose, Bld: 92 mg/dL (ref 70–99)
Potassium: 3.6 mmol/L (ref 3.5–5.1)
SODIUM: 139 mmol/L (ref 135–145)
Total Bilirubin: 0.9 mg/dL (ref 0.3–1.2)
Total Protein: 6.6 g/dL (ref 6.5–8.1)

## 2018-11-04 LAB — PROTIME-INR
INR: 1.4
Prothrombin Time: 16.5 seconds — ABNORMAL HIGH (ref 11.4–15.2)

## 2018-11-04 MED ORDER — CAPECITABINE 500 MG PO TABS: 625.0000 mg/m2 | ORAL_TABLET | Freq: Two times a day (BID) | ORAL | 4 refills | Status: DC

## 2018-11-04 MED ORDER — LIDOCAINE HCL (PF) 1 % IJ SOLN
INTRAMUSCULAR | Status: AC | PRN
  Administered 2018-11-04 (×2): 10 mL

## 2018-11-04 MED ORDER — HEPARIN SOD (PORK) LOCK FLUSH 100 UNIT/ML IV SOLN
INTRAVENOUS | Status: AC | PRN
  Administered 2018-11-04: 500 [IU]

## 2018-11-04 MED ORDER — MIDAZOLAM HCL 2 MG/2ML IJ SOLN
INTRAMUSCULAR | Status: AC
  Filled 2018-11-04: qty 2

## 2018-11-04 MED ORDER — CEFAZOLIN SODIUM-DEXTROSE 2-4 GM/100ML-% IV SOLN
2.0000 g | Freq: Once | INTRAVENOUS | Status: AC
  Administered 2018-11-04: 2 g via INTRAVENOUS

## 2018-11-04 MED ORDER — LIDOCAINE HCL 1 % IJ SOLN
INTRAMUSCULAR | Status: AC
  Filled 2018-11-04: qty 20

## 2018-11-04 MED ORDER — SODIUM CHLORIDE 0.9 % IV SOLN
INTRAVENOUS | Status: DC
  Administered 2018-11-04: 11:00:00 via INTRAVENOUS

## 2018-11-04 MED ORDER — MIDAZOLAM HCL 2 MG/2ML IJ SOLN
INTRAMUSCULAR | Status: AC | PRN
  Administered 2018-11-04: 1 mg via INTRAVENOUS
  Administered 2018-11-04: 0.5 mg via INTRAVENOUS

## 2018-11-04 MED ORDER — FENTANYL CITRATE (PF) 100 MCG/2ML IJ SOLN
INTRAMUSCULAR | Status: AC
  Filled 2018-11-04: qty 2

## 2018-11-04 MED ORDER — HEPARIN SOD (PORK) LOCK FLUSH 100 UNIT/ML IV SOLN
INTRAVENOUS | Status: AC
  Filled 2018-11-04: qty 5

## 2018-11-04 MED ORDER — CEFAZOLIN SODIUM-DEXTROSE 2-4 GM/100ML-% IV SOLN
INTRAVENOUS | Status: AC
  Filled 2018-11-04: qty 100

## 2018-11-04 MED ORDER — FENTANYL CITRATE (PF) 100 MCG/2ML IJ SOLN
INTRAMUSCULAR | Status: AC | PRN
  Administered 2018-11-04: 25 ug via INTRAVENOUS

## 2018-11-04 NOTE — Consult Note (Signed)
Chief Complaint: Patient was seen in consultation today for port a cath placement  Referring Physician(s): Chauncey Cruel  Supervising Physician: Corrie Mckusick  Patient Status: [facility] - Out-pt  History of Present Illness: Peter Wood is an 84 y.o. male with history of newly diagnosed metastatic pancreatic cancer who presents today for port a cath placement for chemotherapy.   Past Medical History:  Diagnosis Date  . Arthritis   . Diverticulosis of colon   . Family history of breast cancer   . Family history of prostate cancer   . HTN (hypertension)   . Hx of colonic polyp   . Pneumonia    as a child/baby  . Poor circulation of extremity    right leg  . Prostate cancer [facility]) 2015   prostate cancer - radiation treated    Past Surgical History:  Procedure Laterality Date  . [place] VITRECTOMY WITH 20 GAUGE MVR PORT FOR MACULAR HOLE Right 09/19/2016   Procedure: 25 GAUGE PARS PLANA VITRECTOMY WITH 20 GAUGE MVR PORT FOR MACULAR HOLE, MEMBRANE PEEL, SERUM PATCH, GAS FLUID EXCHANGE, HEADSCOPE LASER;  Surgeon: [name], MD;  Location: [facility];  Service: Ophthalmology;  Laterality: Right;  . BIOPSY  09/20/2018   Procedure: BIOPSY;  Surgeon: Gatha Mayer, MD;  Location: [facility] ENDOSCOPY;  Service: Endoscopy;;  . CATARACT EXTRACTION  08/27/12   Right  . COLONOSCOPY    . ESOPHAGOGASTRODUODENOSCOPY (EGD) WITH PROPOFOL N/A 09/20/2018   Procedure: ESOPHAGOGASTRODUODENOSCOPY (EGD) WITH PROPOFOL;  Surgeon: Gatha Mayer, MD;  Location: [facility];  Service: Endoscopy;  Laterality: N/A;  . HAND SURGERY  2016   right thumb  . KNEE SURGERY Left 1989    Allergies: Patient has no known allergies.  Medications: Prior to Admission medications   Medication Sig Start Date End Date Taking? Authorizing Provider  amLODipine (NORVASC) 5 MG tablet Take 1 tablet (5 mg total) daily by mouth. 07/20/17  Yes Plotnikov, Evie Lacks, MD  benazepril (LOTENSIN) 40 MG tablet TAKE  ONE TABLET BY MOUTH ONE TIME DAILY  Patient taking differently: Take 40 mg by mouth daily.  09/16/18  Yes Plotnikov, Evie Lacks, MD  Cholecalciferol 1000 UNITS tablet Take 1,000 Units by mouth daily.    Yes [provider]  latanoprost (XALATAN) 0.005 % ophthalmic solution Place 1 drop into both eyes at bedtime.  10/24/17  Yes [provider]  naproxen sodium (ALEVE) 220 MG tablet Take 220-440 mg by mouth 2 (two) times daily as needed (for pain).   Yes [provider]  ondansetron (ZOFRAN) 4 MG tablet Take 1 tablet (4 mg total) by mouth every 8 (eight) hours as needed for nausea or vomiting. 10/01/18  Yes Plotnikov, Evie Lacks, MD  pantoprazole (PROTONIX) 40 MG tablet Take 1 tablet (40 mg total) by mouth 2 (two) times daily before a meal. 09/25/18  Yes Adhikari, Tamsen Meek, MD  prochlorperazine (COMPAZINE) 10 MG tablet Take 1 tablet (10 mg total) by mouth every 6 (six) hours as needed (Nausea or vomiting). 10/31/18  Yes Magrinat, Virgie Dad, MD  apixaban (ELIQUIS) 5 MG TABS tablet Take 1 tablet (5 mg total) by mouth 2 (two) times daily. 10/01/18   Plotnikov, Evie Lacks, MD     Family History  Problem Relation Age of Onset  . Prostate cancer Father        dx >50  . Hypertension Mother   . Ulcers Mother   . Breast cancer Sister  dx 20's  . Prostate cancer Brother        dx under 59, metasttic, cause of his death  . Ulcers Brother   . Ulcers Sister     Social History   Socioeconomic History  . Marital status: Married    Spouse name: Not on file  . Number of children: 4  . Years of education: Not on file  . Highest education level: Not on file  Occupational History  . Occupation: retired  Scientific laboratory technician  . Financial resource strain: Not on file  . Food insecurity:    Worry: Not on file    Inability: Not on file  . Transportation needs:    Medical: Not on file    Non-medical: Not on file  Tobacco Use  . Smoking status: Former Smoker    Years: 4.00    Last  attempt to quit: 09/12/1967    Years since quitting: 51.1  . Smokeless tobacco: Never Used  Substance and Sexual Activity  . Alcohol use: No  . Drug use: No  . Sexual activity: Not on file  Lifestyle  . Physical activity:    Days per week: Not on file    Minutes per session: Not on file  . Stress: Not on file  Relationships  . Social connections:    Talks on phone: Not on file    Gets together: Not on file    Attends religious service: Not on file    Active member of club or organization: Not on file    Attends meetings of clubs or organizations: Not on file    Relationship status: Not on file  Other Topics Concern  . Not on file  Social History Narrative  . Not on file      Review of Systems denies fever, HA, CP, dyspnea, abd pain, back pain,N/V or bleeding; he does have occasional cough and wt loss.  Vital Signs: Blood pressure 137/81, heart rate 80, temp 98.5, respirations 18, O2 sat 97% room air   Physical Exam awake, alert.  Chest clear to auscultation bilaterally.  Heart with regular rate and rhythm.  Abdomen soft, positive bowel sounds, nontender.  Trace pretibial edema bilaterally.  Imaging: No results found.  Labs:  CBC: Recent Labs    10/04/18 1510 10/16/18 0936 10/23/18 1017 11/04/18 1021  WBC 9.1 3.3* 3.1* 4.4  HGB 9.3* 7.5* 8.5* 8.0*  HCT 29.2* 23.4* 27.2* 26.8*  PLT 369 185 174 395    COAGS: Recent Labs    09/19/18 1558 11/04/18 1021  INR 1.33 1.4    BMP: Recent Labs    10/04/18 1510 10/16/18 0936 10/23/18 1017 11/04/18 1021  NA 134* 141 140 139  K 4.2 4.0 3.9 3.6  CL 99 105 102 105  CO2 24 28 27 26   GLUCOSE 76 132* 124* 92  BUN 16 19 19 13   CALCIUM 9.0 9.3 9.1 8.7*  CREATININE 1.03 1.02 1.12 0.86  GFRNONAA >60 >60 >60 >60  GFRAA >60 >60 >60 >60    LIVER FUNCTION TESTS: Recent Labs    10/04/18 1510 10/16/18 0936 10/23/18 1017 11/04/18 1021  BILITOT 1.2 0.7 0.8 0.9  AST 65* 76* 64* 56*  ALT 69* 47* 55* 53*  ALKPHOS  230* 289* 222* 135*  PROT 7.2 6.7 6.9 6.6  ALBUMIN 3.0* 3.1* 3.3* 3.5    TUMOR MARKERS: Recent Labs    09/18/18 1058  AFPTM 2.9  CEA 1,318.2*  CA199 67,479*    Assessment and Plan: 83  y.o. male with history of newly diagnosed metastatic pancreatic cancer who presents today for port a cath placement for chemotherapy.  He has been off Eliquis for PE since Friday of last week.  Risks and benefits of image guided port-a-catheter placement was discussed with the patient/spouse including, but not limited to bleeding, infection, pneumothorax, or fibrin sheath development and need for additional procedures.  All of the patient's questions were answered, patient is agreeable to proceed. Consent signed and in chart.      Thank you for this interesting consult.  I greatly enjoyed meeting Peter Wood and look forward to participating in their care.  A copy of this report was sent to the requesting provider on this date.  Electronically Signed: D. Rowe Robert, PA-C 11/04/2018, 11:24 AM   I spent a total of 25 minutes  in face to face in clinical consultation, greater than 50% of which was counseling/coordinating care for Port-A-Cath placement

## 2018-11-04 NOTE — Telephone Encounter (Addendum)
Oral Oncology Pharmacist Encounter  Received new prescription for Xeloda (capecitabine) for the treatment of stage IV gastric adenocarcinoma, HER-2 positive in conjunction with oxaliplatin and trastuzumab, planned duration until disease progression or unacceptable toxicity.  Labs from 11/04/18 assessed, OK for treatment. SCr=0.86, round to 1.0 for age> 65, est CrCl ~ 50 mL/min  Noted dose reduction of Xeloda to ~ 600 mg/m2 twice daily on days 1-14 of each 21 day cycle Noted dose reduction of oxaliplatin to '85mg'$ /m2  Current medication list in Epic reviewed, moderate DDI with Xeloda and pantoprazole identified:  Category C interaction with Xeloda and pantoprazole: conflicting retrospective data with one analysis showing decrease in OS and PFS when Xeloda was used for adjuvant treatment of gastric cancer concurrently with PPI, another study showing no effect on important efficacy outcomes. Patient will be screened for ability to discontinue PPI therapy. If unable to d/c PPI, no change to treatment is indicated.  Prescription has been e-scribed to the [facility] for benefits analysis and approval.  Oral Oncology Clinic will continue to follow for insurance authorization, copayment issues, initial counseling and start date.  Johny Drilling, PharmD, BCPS, BCOP  11/04/2018 12:08 PM Oral Oncology Clinic [phone]

## 2018-11-04 NOTE — Procedures (Signed)
Interventional Radiology Procedure Note  Procedure: Placement of a right IJ approach single lumen PowerPort.  Tip is positioned at the superior cavoatrial junction and catheter is ready for immediate use.  Complications: None Recommendations:  - Ok to shower tomorrow - Do not submerge for 7 days - Routine line care   Signed,  Avonell Lenig S. Yajaira Doffing, DO   

## 2018-11-04 NOTE — Discharge Instructions (Addendum)
You may shower and remove your dressing tomorrow. ° °DO NOT use EMLA cream for 2 weeks after port placement as this cream will remove surgical glue on your incision. ° °Implanted Port Home Guide °An implanted port is a device that is placed under the skin. It is usually placed in the chest. The device can be used to give IV medicine, to take blood, or for dialysis. You may have an implanted port if: °· You need IV medicine that would be irritating to the small veins in your hands or arms. °· You need IV medicines, such as antibiotics, for a long period of time. °· You need IV nutrition for a long period of time. °· You need dialysis. °Having a port means that your health care provider will not need to use the veins in your arms for these procedures. You may have fewer limitations when using a port than you would if you used other types of long-term IVs, and you will likely be able to return to normal activities after your incision heals. °An implanted port has two main parts: °· Reservoir. The reservoir is the part where a needle is inserted to give medicines or draw blood. The reservoir is round. After it is placed, it appears as a small, raised area under your skin. °· Catheter. The catheter is a thin, flexible tube that connects the reservoir to a vein. Medicine that is inserted into the reservoir goes into the catheter and then into the vein. °How is my port accessed? °To access your port: °· A numbing cream may be placed on the skin over the port site. °· Your health care provider will put on a mask and sterile gloves. °· The skin over your port will be cleaned carefully with a germ-killing soap and allowed to dry. °· Your health care provider will gently pinch the port and insert a needle into it. °· Your health care provider will check for a blood return to make sure the port is in the vein and is not clogged. °· If your port needs to remain accessed to get medicine continuously (constant infusion), your  health care provider will place a clear bandage (dressing) over the needle site. The dressing and needle will need to be changed every week, or as told by your health care provider. °What is flushing? °Flushing helps keep the port from getting clogged. Follow instructions from your health care provider about how and when to flush the port. Ports are usually flushed with saline solution or a medicine called heparin. The need for flushing will depend on how the port is used: °· If the port is only used from time to time to give medicines or draw blood, the port may need to be flushed: °? Before and after medicines have been given. °? Before and after blood has been drawn. °? As part of routine maintenance. Flushing may be recommended every 4-6 weeks. °· If a constant infusion is running, the port may not need to be flushed. °· Throw away any syringes in a disposal container that is meant for sharp items (sharps container). You can buy a sharps container from a pharmacy, or you can make one by using an empty hard plastic bottle with a cover. °How long will my port stay implanted? °The port can stay in for as long as your health care provider thinks it is needed. When it is time for the port to come out, a surgery will be done to remove it. The surgery   will be similar to the procedure that was done to put the port in. °Follow these instructions at home: ° °· Flush your port as told by your health care provider. °· If you need an infusion over several days, follow instructions from your health care provider about how to take care of your port site. Make sure you: °? Wash your hands with soap and water before you change your dressing. If soap and water are not available, use alcohol-based hand sanitizer. °? Change your dressing as told by your health care provider. °? Place any used dressings or infusion bags into a plastic bag. Throw that bag in the trash. °? Keep the dressing that covers the needle clean and dry. Do not  get it wet. °? Do not use scissors or sharp objects near the tube. °? Keep the tube clamped, unless it is being used. °· Check your port site every day for signs of infection. Check for: °? Redness, swelling, or pain. °? Fluid or blood. °? Pus or a bad smell. °· Protect the skin around the port site. °? Avoid wearing bra straps that rub or irritate the site. °? Protect the skin around your port from seat belts. Place a soft pad over your chest if needed. °· Bathe or shower as told by your health care provider. The site may get wet as long as you are not actively receiving an infusion. °· Return to your normal activities as told by your health care provider. Ask your health care provider what activities are safe for you. °· Carry a medical alert card or wear a medical alert bracelet at all times. This will let health care providers know that you have an implanted port in case of an emergency. °Get help right away if: °· You have redness, swelling, or pain at the port site. °· You have fluid or blood coming from your port site. °· You have pus or a bad smell coming from the port site. °· You have a fever. °Summary °· Implanted ports are usually placed in the chest for long-term IV access. °· Follow instructions from your health care provider about flushing the port and changing bandages (dressings). °· Take care of the area around your port by avoiding clothing that puts pressure on the area, and by watching for signs of infection. °· Protect the skin around your port from seat belts. Place a soft pad over your chest if needed. °· Get help right away if you have a fever or you have redness, swelling, pain, drainage, or a bad smell at the port site. °This information is not intended to replace advice given to you by your health care provider. Make sure you discuss any questions you have with your health care provider. °Document Released: 08/28/2005 Document Revised: 09/30/2016 Document Reviewed: 09/30/2016 °Elsevier  Interactive Patient Education © 2019 [employer] ° ° °Moderate Conscious Sedation, Adult, Care After °These instructions provide you with information about caring for yourself after your procedure. Your health care provider may also give you more specific instructions. Your treatment has been planned according to current medical practices, but problems sometimes occur. Call your health care provider if you have any problems or questions after your procedure. °What can I expect after the procedure? °After your procedure, it is common: °· To feel sleepy for several hours. °· To feel clumsy and have poor balance for several hours. °· To have poor judgment for several hours. °· To vomit if you eat too soon. °Follow these   instructions at home: °For at least 24 hours after the procedure: ° °· Do not: °? Participate in activities where you could fall or become injured. °? Drive. °? Use heavy machinery. °? Drink alcohol. °? Take sleeping pills or medicines that cause drowsiness. °? Make important decisions or sign legal documents. °? Take care of children on your own. °· Rest. °Eating and drinking °· Follow the diet recommended by your health care provider. °· If you vomit: °? Drink water, juice, or soup when you can drink without vomiting. °? Make sure you have little or no nausea before eating solid foods. °General instructions °· Have a responsible adult stay with you until you are awake and alert. °· Take over-the-counter and prescription medicines only as told by your health care provider. °· If you smoke, do not smoke without supervision. °· Keep all follow-up visits as told by your health care provider. This is important. °Contact a health care provider if: °· You keep feeling nauseous or you keep vomiting. °· You feel light-headed. °· You develop a rash. °· You have a fever. °Get help right away if: °· You have trouble breathing. °This information is not intended to replace advice given to you by your health care  provider. Make sure you discuss any questions you have with your health care provider. °Document Released: 06/18/2013 Document Revised: 01/31/2016 Document Reviewed: 12/18/2015 °Elsevier Interactive Patient Education © 2019 [employer] ° °

## 2018-11-05 ENCOUNTER — Telehealth: Payer: Self-pay

## 2018-11-05 MED FILL — CAPECITABINE 500 MG TABS: 500 | 21 days supply | Qty: 56 | Fill #0

## 2018-11-05 NOTE — Telephone Encounter (Signed)
Oral Chemotherapy Pharmacist Encounter   I spoke with patient for overview of: Xeloda (capecitabine) for the treatment of stage IV gastric adenocarcinoma, HER-2 positive in conjunction with oxaliplatin and trastuzumab, planned duration until disease progression or unacceptable toxicity.   Counseled patient on administration, dosing, side effects, monitoring, drug-food interactions, safe handling, storage, and disposal.  Patient will take Xeloda 500mg tablets, 2 tablets (1000mg) by mouth in AM and 2 tabs (1000mg) by mouth in PM, within 30 minutes of finishing meals, on days 1-14 of each 21 day cycle.   Oxaliplatin will be infused on day 1 of each 21 day cycle.  Herceptin will be infused at 6mg/kg IV on day 1 of each 21 day cycle.  Xeloda, oxaliplatin, and Herceptin start date: 11/06/2018  Adverse effects include but are not limited to: fatigue, decreased blood counts, GI upset, diarrhea, mouth sores, and hand-foot syndrome.  Patient has anti-emetic on hand and knows to take it if nausea develops.   Patient will obtain anti diarrheal and alert the office of 4 or more loose stools above baseline.  Patient informed about possibility of peripheral neuropathy and cold sensitivity with oxaliplatin administration. Patient informed about possible decrease in left ventricular ejection fraction with Herceptin administration and that patient will be followed every 3 months with an ECHO to monitor LVEF.  Reviewed with patient importance of keeping a medication schedule and plan for any missed doses.  Mr. Woodrick voiced understanding and appreciation.   All questions answered. Medication reconciliation performed and medication/allergy list updated.  Insurance authorization was not required for patient Xeloda. Test claim at the pharmacy revealed copayment of $50. Oral oncology patient advocate was successful in securing foundation copayment grant to cover any out-of-pocket expenses for Xeloda at the  pharmacy. Patient will pick up his first fill of Xeloda from the [facility] outpatient pharmacy this afternoon, 11/05/2018, at $0 out-of-pocket cost. Patient understands that pharmacy will reach out 5 to 7 days prior to needing new fill to arrange for next cycle of Xeloda.  Patient knows to call the office with questions or concerns. [facility] will continue to follow.  Jesse Mack, PharmD, BCPS, BCOP  11/05/2018   10:34 AM [facility] [phone] 

## 2018-11-05 NOTE — Telephone Encounter (Signed)
Oral Oncology Patient Advocate Encounter  I was successful at securing a grant with [employer] for $4,000. This will keep the out of pocket expense for Xeloda at $0. The grant information is as follows and has been shared with [place].  Approval dates: 11/05/18-11/06/19 ID: [id number] Group: [id number] BIN: [id number] PCN: [id number]  I gave the patient this information while arranging for him to pick up at [facility]. He will pick up today with a $0 copay.   [place] Phone [phone] Fax [phone] 11/05/2018   11:04 AM

## 2018-11-05 NOTE — Progress Notes (Signed)
[facility]  [phone]) [phone] Fax:(336) [phone]     ID: Peter Wood DOB: [birth year]  MR#: [id number]  LNL#:[id number]  Patient Care Team: Cassandria Anger, MD as PCP - General Milus Banister, MD as Attending Physician (Gastroenterology) Magrinat, Virgie Dad, MD as Consulting Physician (Oncology) Gatha Mayer, MD as Consulting Physician (Gastroenterology) [name], MD as Consulting Physician (Ophthalmology) Ramon Dredge, MD as Referring Physician (Radiation Oncology) OTHER MD:   CHIEF COMPLAINT: Metastatic gastric adenocarcinoma  CURRENT TREATMENT: oxaliplatin, capecitabine, trastuzumab   INTERVAL HISTORY: Peter Wood returns for follow up of his metastatic gastric adenocarcinoma, accompanied by his wife. Since his last visit here we received the CARIS report on Peter Wood is gastric cancer, and it is 3+ for her to.  It is also positive for PD-L1.  There is no significant MSI instability or mismatch repair problems.  The mutational burden is intermediate.  There is also a T p53 mutation.  Giving these findings we have revised his diagnosis.  This is much more consistent with a gastric primary and the elevated CA 19 level is felt to be nonspecific.  Accordingly we are changing his chemotherapy.  He will receive carboplatin intravenously, capecitabine orally, and he will start trastuzumab every 3 weeks.  Echocardiogram 09/20/2018 shows an ejection fraction of 55-60%.  Since the last visit here he underwent port placement, on 11/04/2018. He reports this went well, and he no longer has pain or discomfort.  He received a grant for the [employer], so he does not have to pay for it.   REVIEW OF SYSTEMS: Jamil reports the nausea and vomiting has improved. He states the alkaline water he was drinking and his nausea medicine caused a burning sensation in his stomach. He reports his stool has returned to a normal color. He states he is keeping busy at  home. The patient denies unusual headaches, visual changes, nausea, vomiting, stiff neck, dizziness, or gait imbalance. There has been no cough, phlegm production, or pleurisy, no chest pain or pressure, and no change in bowel or bladder habits. The patient denies fever, rash, bleeding, unexplained fatigue or unexplained weight loss. A detailed review of systems was otherwise entirely negative.   HISTORY OF CURRENT ILLNESS: From the original intake note:  Peter Wood presented to the emergency room 09/07/2018 with mid/upper abdominal pain.  He reported weight loss of 25 pounds over the past 6 months and significant anorexia.  His liver function tests were abnormal and a right upper quadrant ultrasound was obtained.  This was read as concerning for malignancy and a CT of the abdomen and pelvis with contrast was obtained the same day, showing  1. Too numerous to count hypodense masses of the liver concerning for hepatic metastasis. In the region of the porta hepatis, there are hypodense lesions more likely associated with the liver though the pancreatic head is partially obscured by a 3.2 cm hypodense mass. Findings are more likely associated with the liver and less likely an isolated pancreatic mass. 2. Upper abdominal mesenteric and peripancreatic lymphadenopathy suspicious for metastatic disease. 3. 6 mm sclerotic density in the left iliac bone adjacent to the SI joint. Given history of prostate cancer, differential considerations would include osteoblastic metastasis or possibly bone island. Showed only diverticular disease.  He was referred to GI and Dr. Ardis Hughs notes a colonoscopy performed 04/23/2017 makes the possibility of colon cancer unlikely.  Further work-up was planned, but on 09/19/2018 the patient again presented to the emergency room  with chest pain, now in a different area, and a staging CT scan of the chest showed filling defects in the right lower lobe pulmonary arteries consistent  with embolism.  There was a small pericardial effusion and an additional 2.4 cm left thyroid mass.  Aortic atherosclerosis was noted  He was started on intravenous heparin and on 09/20/2018 underwent upper endoscopy under Dr. Carlean Purl showing a giant nonbleeding cratered gastric ulcer in the posterior wall of the stomach.  Biopsies of this procedure showed (SZA 20-187) adenocarcinoma.    At that point we were consulted and we obtained tumor markers which included a CEA of 1318.2, a CA 19-9 of 67,479, a normal AFP and a normal PSA.  I discussed the case with pathology and they do not feel that additional testing would be helpful in terms of discriminating between a primary gastric and a primary biliary/pancreatic tumor.  The patient's subsequent history is as detailed below.   PAST MEDICAL HISTORY: Past Medical History:  Diagnosis Date  . Arthritis   . Diverticulosis of colon   . Family history of breast cancer   . Family history of prostate cancer   . HTN (hypertension)   . Hx of colonic polyp   . Pneumonia    as a child/baby  . Poor circulation of extremity    right leg  . Prostate cancer ([facility]) 2015   prostate cancer - radiation treated     PAST SURGICAL HISTORY: Past Surgical History:  Procedure Laterality Date  . [place] VITRECTOMY WITH 20 GAUGE MVR PORT FOR MACULAR HOLE Right 09/19/2016   Procedure: 25 GAUGE PARS PLANA VITRECTOMY WITH 20 GAUGE MVR PORT FOR MACULAR HOLE, MEMBRANE PEEL, SERUM PATCH, GAS FLUID EXCHANGE, HEADSCOPE LASER;  Surgeon: [name], MD;  Location: [facility];  Service: Ophthalmology;  Laterality: Right;  . BIOPSY  09/20/2018   Procedure: BIOPSY;  Surgeon: Gatha Mayer, MD;  Location: [facility] ENDOSCOPY;  Service: Endoscopy;;  . CATARACT EXTRACTION  08/27/12   Right  . COLONOSCOPY    . ESOPHAGOGASTRODUODENOSCOPY (EGD) WITH PROPOFOL N/A 09/20/2018   Procedure: ESOPHAGOGASTRODUODENOSCOPY (EGD) WITH PROPOFOL;  Surgeon: Gatha Mayer, MD;  Location:  [facility];  Service: Endoscopy;  Laterality: N/A;  . HAND SURGERY  2016   right thumb  . IR IMAGING GUIDED PORT INSERTION  11/04/2018  . KNEE SURGERY Left 1989     FAMILY HISTORY: Family History  Problem Relation Age of Onset  . Prostate cancer Father        dx >50  . Hypertension Mother   . Ulcers Mother   . Breast cancer Sister        dx 20's  . Prostate cancer Brother        dx under 73, metasttic, cause of his death  . Ulcers Brother   . Ulcers Sister    Marky's father died from heart complications with a pacemaker at age 75; Nyzaiah's father also has prostate cancer. Patients' mother died from natural causes at age 74. The patient has 4 brothers and 3 sisters. One of [place] sisters had breast cancer in her 64s and a brother had prostate cancer. Patient denies anyone in her family having ovarian, or pancreatic cancer. Shiraz mentions that his youngest sister and his brother with prostate cancer also had stomach ulcers.    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 His wife, [name], is a retired [employer] summer Radiographer, therapeutic. As of 09/2018, they have been married for  56 years. They have 4 children, Claiborne Billings, [place], [place], and [place]. Claiborne Billings lives in [place] and works at the [employer]. Sherrod lives in [place] and works at [employer] and as a part Horticulturist, commercial. Germane lives in [place] and is a Engineer, drilling for a medical company. Beverely Low lives in [place] and works at [employer]. Jachin has 7 grandchildren and 1 step great grandchild. He attends [employer].     ADVANCED DIRECTIVES: His wife, [name], is automatically his healthcare power of attorney.     HEALTH MAINTENANCE: Social History   Tobacco Use  . Smoking status: Former Smoker    Years: 4.00    Last attempt to quit: 09/12/1967    Years since quitting: 51.1  . Smokeless tobacco: Never Used  Substance Use Topics  . Alcohol use: No  . Drug use: No    Colonoscopy:  2018/[facility]  PSA: 0.54 on 09/18/2018  No Known Allergies  Current Outpatient Medications  Medication Sig Dispense Refill  . amLODipine (NORVASC) 5 MG tablet Take 1 tablet (5 mg total) daily by mouth. 90 tablet 3  . apixaban (ELIQUIS) 5 MG TABS tablet Take 1 tablet (5 mg total) by mouth 2 (two) times daily. 60 tablet 11  . benazepril (LOTENSIN) 40 MG tablet TAKE ONE TABLET BY MOUTH ONE TIME DAILY  (Patient taking differently: Take 40 mg by mouth daily. ) 90 tablet 1  . capecitabine (XELODA) 500 MG tablet Take 2 tablets (1,000 mg total) by mouth 2 (two) times daily after a meal. Take on days 1-14 of chemotherapy. Repeat every 21 days. 56 tablet 4  . Cholecalciferol 1000 UNITS tablet Take 1,000 Units by mouth daily.     Marland Kitchen latanoprost (XALATAN) 0.005 % ophthalmic solution Place 1 drop into both eyes at bedtime.     . lidocaine-prilocaine (EMLA) cream Apply to affected area once 30 g 3  . naproxen sodium (ALEVE) 220 MG tablet Take 220-440 mg by mouth 2 (two) times daily as needed (for pain).    . ondansetron (ZOFRAN) 4 MG tablet Take 1 tablet (4 mg total) by mouth every 8 (eight) hours as needed for nausea or vomiting. 20 tablet 1  . pantoprazole (PROTONIX) 40 MG tablet Take 1 tablet (40 mg total) by mouth 2 (two) times daily before a meal. 60 tablet 0  . prochlorperazine (COMPAZINE) 10 MG tablet Take 1 tablet (10 mg total) by mouth every 6 (six) hours as needed (Nausea or vomiting). 30 tablet 1   No current facility-administered medications for this visit.    Facility-Administered Medications Ordered in Other Visits  Medication Dose Route Frequency Provider Last Rate Last Dose  . sodium chloride flush (NS) 0.9 % injection 10 mL  10 mL Intravenous PRN Magrinat, Virgie Dad, MD         OBJECTIVE: Older African-American man in no acute distress  Vitals:   11/06/18 0827  BP: (!) 108/53  Pulse: 86  Resp: 18  Temp: 98.6 F (37 C)  SpO2: 100%     Body mass index is 23 kg/m.   Wt Readings  from Last 3 Encounters:  11/06/18 136 lb 1.6 oz (61.7 kg)  10/23/18 135 lb (61.2 kg)  10/16/18 140 lb (63.5 kg)  ECOG FS:2 - Symptomatic, <50% confined to bed  Sclerae unicteric, EOMs intact Oropharynx clear and moist No cervical or supraclavicular adenopathy Lungs no rales or rhonchi Heart regular rate and rhythm Abd soft, nontender, positive bowel sounds MSK no focal spinal tenderness  Neuro: nonfocal, well oriented, positive affect  LAB RESULTS:  CMP     Component Value Date/Time   NA 139 11/04/2018 1021   K 3.6 11/04/2018 1021   CL 105 11/04/2018 1021   CO2 26 11/04/2018 1021   GLUCOSE 92 11/04/2018 1021   GLUCOSE 105 (H) 09/13/2006 0900   BUN 13 11/04/2018 1021   CREATININE 0.86 11/04/2018 1021   CREATININE 1.02 10/16/2018 0936   CALCIUM 8.7 (L) 11/04/2018 1021   PROT 6.6 11/04/2018 1021   ALBUMIN 3.5 11/04/2018 1021   AST 56 (H) 11/04/2018 1021   AST 76 (H) 10/16/2018 0936   ALT 53 (H) 11/04/2018 1021   ALT 47 (H) 10/16/2018 0936   ALKPHOS 135 (H) 11/04/2018 1021   BILITOT 0.9 11/04/2018 1021   BILITOT 0.7 10/16/2018 0936   GFRNONAA >60 11/04/2018 1021   GFRNONAA >60 10/16/2018 0936   GFRAA >60 11/04/2018 1021   GFRAA >60 10/16/2018 0936    No results found for: TOTALPROTELP, ALBUMINELP, A1GS, A2GS, BETS, BETA2SER, GAMS, MSPIKE, SPEI  No results found for: KPAFRELGTCHN, LAMBDASER, KAPLAMBRATIO  Lab Results  Component Value Date   WBC 4.4 11/04/2018   NEUTROABS 2.3 11/04/2018   HGB 8.0 (L) 11/04/2018   HCT 26.8 (L) 11/04/2018   MCV 98.9 11/04/2018   PLT 395 11/04/2018    _0 @  No results found for: LABCA2  No components found for: WUXLKG401  Recent Labs  Lab 11/04/18 1021  INR 1.4    No results found for: LABCA2  Lab Results  Component Value Date   CAN199 75,452 (H) 10/23/2018    No results found for: UUV253  No results found for: GUY403  No results found for: CA2729  No components found for: HGQUANT  No results  found for: CEA1 / No results found for: CEA1   No results found for: AFPTUMOR  No results found for: [place]  No results found for: [facility] Outpatient Visit on 11/04/2018  Component Date Value Ref Range Status  . WBC 11/04/2018 4.4  4.0 - 10.5 K/uL Final  . RBC 11/04/2018 2.71* 4.22 - 5.81 MIL/uL Final  . Hemoglobin 11/04/2018 8.0* 13.0 - 17.0 g/dL Final  . HCT 11/04/2018 26.8* 39.0 - 52.0 % Final  . MCV 11/04/2018 98.9  80.0 - 100.0 fL Final  . MCH 11/04/2018 29.5  26.0 - 34.0 pg Final  . MCHC 11/04/2018 29.9* 30.0 - 36.0 g/dL Final  . RDW 11/04/2018 18.1* 11.5 - 15.5 % Final  . Platelets 11/04/2018 395  150 - 400 K/uL Final  . nRBC 11/04/2018 0.5* 0.0 - 0.2 % Final  . Neutrophils Relative % 11/04/2018 51  % Final  . Neutro Abs 11/04/2018 2.3  1.7 - 7.7 K/uL Final  . Lymphocytes Relative 11/04/2018 31  % Final  . Lymphs Abs 11/04/2018 1.3  0.7 - 4.0 K/uL Final  . Monocytes Relative 11/04/2018 12  % Final  . Monocytes Absolute 11/04/2018 0.5  0.1 - 1.0 K/uL Final  . Eosinophils Relative 11/04/2018 2  % Final  . Eosinophils Absolute 11/04/2018 0.1  0.0 - 0.5 K/uL Final  . Basophils Relative 11/04/2018 1  % Final  . Basophils Absolute 11/04/2018 0.0  0.0 - 0.1 K/uL Final  . Immature Granulocytes 11/04/2018 3  % Final  . Abs Immature Granulocytes 11/04/2018 0.12* 0.00 - 0.07 K/uL Final   Performed at [facility], [place] [address] [place], [place] [address]  . Sodium 11/04/2018 139  135 - 145 mmol/L  Final  . Potassium 11/04/2018 3.6  3.5 - 5.1 mmol/L Final  . Chloride 11/04/2018 105  98 - 111 mmol/L Final  . CO2 11/04/2018 26  22 - 32 mmol/L Final  . Glucose, Bld 11/04/2018 92  70 - 99 mg/dL Final  . BUN 11/04/2018 13  8 - 23 mg/dL Final  . Creatinine, Ser 11/04/2018 0.86  0.61 - 1.24 mg/dL Final  . Calcium 11/04/2018 8.7* 8.9 - 10.3 mg/dL Final  . Total Protein 11/04/2018 6.6  6.5 - 8.1 g/dL Final  . Albumin 11/04/2018 3.5  3.5 - 5.0 g/dL Final  .  AST 11/04/2018 56* 15 - 41 U/L Final  . ALT 11/04/2018 53* 0 - 44 U/L Final  . Alkaline Phosphatase 11/04/2018 135* 38 - 126 U/L Final  . Total Bilirubin 11/04/2018 0.9  0.3 - 1.2 mg/dL Final  . GFR calc non Af Amer 11/04/2018 >60  >60 mL/min Final  . GFR calc Af Amer 11/04/2018 >60  >60 mL/min Final  . Anion gap 11/04/2018 8  5 - 15 Final   Performed at [facility], [place] [address], [place], [place] [address]  . Prothrombin Time 11/04/2018 16.5* 11.4 - 15.2 seconds Final  . INR 11/04/2018 1.4   Final   Performed at [facility], [address] [place], [place] [address]    (this displays the last labs from the last 3 days)  No results found for: TOTALPROTELP, ALBUMINELP, A1GS, A2GS, BETS, BETA2SER, GAMS, MSPIKE, SPEI (this displays SPEP labs)  No results found for: KPAFRELGTCHN, LAMBDASER, KAPLAMBRATIO (kappa/lambda light chains)  No results found for: HGBA, HGBA2QUANT, HGBFQUANT, HGBSQUAN (Hemoglobinopathy evaluation)   No results found for: LDH  Lab Results  Component Value Date   IRON 38 (L) 09/21/2018   TIBC 251 09/21/2018   IRONPCTSAT 15 (L) 09/21/2018   (Iron and TIBC)  Lab Results  Component Value Date   FERRITIN 138 09/21/2018    Urinalysis    Component Value Date/Time   COLORURINE YELLOW 09/22/2018 [place] 09/22/2018 0541   LABSPEC 1.014 09/22/2018 [place] 6.0 09/22/2018 [place] 09/22/2018 [place] 07/20/2017 [place] 09/22/2018 [place] 09/22/2018 0541   KETONESUR 5 (A) 09/22/2018 0541   PROTEINUR NEGATIVE 09/22/2018 0541   UROBILINOGEN 0.2 07/20/2017 1056   NITRITE NEGATIVE 09/22/2018 0541   LEUKOCYTESUR NEGATIVE 09/22/2018 0541   Results for CHILTON, SALLADE (MRN [id number]) as of 10/04/2018 14:06  Ref. Range 09/18/2018 10:58  CA 19-9 Latest Ref Range: <34 U/mL 67,479 (H)  CEA Latest Units: ng/mL 1,318.2 (H)       STUDIES:  Ir Imaging Guided Port Insertion  Result Date: 11/04/2018 INDICATION: 84 year old male with a history of metastatic pancreatic carcinoma EXAM: IMPLANTED PORT A CATH PLACEMENT WITH ULTRASOUND AND FLUOROSCOPIC GUIDANCE MEDICATIONS: 2.0 g Ancef; The antibiotic was administered within an appropriate time interval prior to skin puncture. ANESTHESIA/SEDATION: Moderate (conscious) sedation was employed during this procedure. A total of Versed 1.5 mg and Fentanyl 25 mcg was administered intravenously. Moderate Sedation Time: 19 minutes. The patient's level of consciousness and vital signs were monitored continuously by radiology nursing throughout the procedure under my direct supervision. FLUOROSCOPY TIME:  0 minutes, 12 seconds (1.0 mGy) COMPLICATIONS: None PROCEDURE: The procedure, risks, benefits, and alternatives were explained to the patient. Questions regarding the procedure were encouraged and answered. The patient understands and consents to the procedure. Ultrasound survey  was performed with images stored and sent to PACs. The right neck and chest was prepped with chlorhexidine, and draped in the usual sterile fashion using maximum barrier technique (cap and mask, sterile gown, sterile gloves, large sterile sheet, hand hygiene and cutaneous antiseptic). Antibiotic prophylaxis was provided with 2.0g Ancef administered IV one hour prior to skin incision. Local anesthesia was attained by infiltration with 1% lidocaine without epinephrine. Ultrasound demonstrated patency of the right internal jugular vein, and this was documented with an image. Under real-time ultrasound guidance, this vein was accessed with a 21 gauge micropuncture needle and image documentation was performed. A small dermatotomy was made at the access site with an 11 scalpel. A 0.018" wire was advanced into the SVC and used to estimate the length of the internal catheter. The access needle exchanged for a 48F micropuncture  vascular sheath. The 0.018" wire was then removed and a 0.035" wire advanced into the IVC. An appropriate location for the subcutaneous reservoir was selected below the clavicle and an incision was made through the skin and underlying soft tissues. The subcutaneous tissues were then dissected using a combination of blunt and sharp surgical technique and a pocket was formed. A single lumen power injectable portacatheter was then tunneled through the subcutaneous tissues from the pocket to the dermatotomy and the port reservoir placed within the subcutaneous pocket. The venous access site was then serially dilated and a peel away vascular sheath placed over the wire. The wire was removed and the port catheter advanced into position under fluoroscopic guidance. The catheter tip is positioned in the cavoatrial junction. This was documented with a spot image. The portacatheter was then tested and found to flush and aspirate well. The port was flushed with saline followed by 100 units/mL heparinized saline. The pocket was then closed in two layers using first subdermal inverted interrupted absorbable sutures followed by a running subcuticular suture. The epidermis was then sealed with Dermabond. The dermatotomy at the venous access site was also seal with Dermabond. Patient tolerated the procedure well and remained hemodynamically stable throughout. No complications encountered and no significant blood loss encountered IMPRESSION: Status post right IJ port catheter placement. Signed, Dulcy Fanny. Dellia Nims, RPVI [facility] Electronically Signed   By: Corrie Mckusick D.O.   On: 11/04/2018 12:32     ELIGIBLE FOR AVAILABLE RESEARCH PROTOCOL: no   ASSESSMENT: 84 y.o. [place], [place] man status post gastric biopsy 09/20/2018 showing adenocarcinoma, with a CA 19-9 greater than 65,000; staging studies showing an apparent mass adjacent to the head of the pancreas,  innumerable liver lesions, upper abdominal mesenteric and peripancreatic lymphadenopathy, and an isolated sclerotic density in the left iliac bone  (a) genomics requested on gastric biopsy showed the tumor to be HER-2 amplified at 3+, PD-L1 positive, and T p53 mutated.  MSI is stable, mismatch repair status is proficient and the tumor mutational burden is intermediate.  (1) chest CT scan 09/19/2018 shows right lower lobe pulmonary embolism  (a) on intravenous heparin started 09/19/2018, transitioned to apixaban 10/01/2018  (2) genetics testing 10/04/2018: negative  (a) family history of prostate and early breast cancer; patient has a history of prostate cancer  (3) started gemcitabine/Abraxane 10/09/2018, repeated days 1, 8 and 15 of each 28-day cycle  (a) stopped after 1 cycle (3 doses) with reassessment of diagnosis based on CARIS results, noted above  (4) to start oxaliplatin, capecitabine and trastuzumab 11/06/2018  (a) baseline echocardiogram 09/20/2018 shows an ejection fraction in  the 55-60% range  (b) oxaliplatin and trastuzumab will be given every 21 days  (c) capecitabine initial dose will be 1000 mg twice daily for 14 days of every 21-day cycle   PLAN:  Tyce tolerated his earlier chemotherapy generally well and there was some clinical evidence of response (better appetite, less nausea).  However we have reassessed his tumor and the pattern of genetic alterations is more consistent with a gastric cancer than a pancreatic, despite the elevated CA 19.  Accordingly he will be treated as a gastric cancer with [place] ox chemotherapy, oxaliplatin every 21 days and capecitabine twice daily for 14 days beginning on the first day of each 21-day cycle  He will also receive trastuzumab every 21 days.  We will receive his first dose today.  His echocardiogram in January showed a good ejection fraction and this will be repeated every 3 months.  He is going to see me again in 1 week chiefly to  assess tolerance.  If he can tolerate this treatment the plan will be to go through 3 cycles and then restage  We will follow his tumor markers every 21 days  We also discussed how to take his supportive medicines including the antiemetics and use of EMLA cream.  I spent approximately 30 minutes face to face with Vimal with more than 50% of that time spent in counseling and coordination of care.   He knows to call for any other issue that may develop before the next visit.  Virgie Dad. Magrinat, MD 11/06/18 8:56 AM Medical Oncology and Hematology [facility] [address] [place], [place] [address] Tel. [phone]    Fax. [phone]   I, Wilburn Mylar, am acting as scribe for Dr. Virgie Dad. Magrinat.  I, Lurline Del MD, have reviewed the above documentation for accuracy and completeness, and I agree with the above.

## 2018-11-06 ENCOUNTER — Inpatient Hospital Stay: Payer: Medicare Other

## 2018-11-06 ENCOUNTER — Other Ambulatory Visit: Payer: Self-pay | Admitting: *Deleted

## 2018-11-06 ENCOUNTER — Encounter: Payer: Self-pay | Admitting: Oncology

## 2018-11-06 ENCOUNTER — Inpatient Hospital Stay (HOSPITAL_BASED_OUTPATIENT_CLINIC_OR_DEPARTMENT_OTHER): Payer: Medicare Other | Admitting: Oncology

## 2018-11-06 ENCOUNTER — Telehealth: Payer: Self-pay | Admitting: Oncology

## 2018-11-06 VITALS — BP 108/53 | HR 86 | Temp 98.6°F | Resp 18 | Ht 64.5 in | Wt 136.1 lb

## 2018-11-06 DIAGNOSIS — C799 Secondary malignant neoplasm of unspecified site: Principal | ICD-10-CM

## 2018-11-06 DIAGNOSIS — C169 Malignant neoplasm of stomach, unspecified: Secondary | ICD-10-CM | POA: Diagnosis not present

## 2018-11-06 DIAGNOSIS — C787 Secondary malignant neoplasm of liver and intrahepatic bile duct: Secondary | ICD-10-CM

## 2018-11-06 DIAGNOSIS — T451X5A Adverse effect of antineoplastic and immunosuppressive drugs, initial encounter: Secondary | ICD-10-CM

## 2018-11-06 DIAGNOSIS — C61 Malignant neoplasm of prostate: Secondary | ICD-10-CM

## 2018-11-06 DIAGNOSIS — Z7901 Long term (current) use of anticoagulants: Secondary | ICD-10-CM

## 2018-11-06 DIAGNOSIS — Z79899 Other long term (current) drug therapy: Secondary | ICD-10-CM

## 2018-11-06 DIAGNOSIS — C250 Malignant neoplasm of head of pancreas: Secondary | ICD-10-CM | POA: Diagnosis not present

## 2018-11-06 DIAGNOSIS — Z86711 Personal history of pulmonary embolism: Secondary | ICD-10-CM

## 2018-11-06 DIAGNOSIS — C168 Malignant neoplasm of overlapping sites of stomach: Secondary | ICD-10-CM

## 2018-11-06 DIAGNOSIS — C7951 Secondary malignant neoplasm of bone: Secondary | ICD-10-CM

## 2018-11-06 DIAGNOSIS — C160 Malignant neoplasm of cardia: Secondary | ICD-10-CM

## 2018-11-06 DIAGNOSIS — D649 Anemia, unspecified: Secondary | ICD-10-CM

## 2018-11-06 DIAGNOSIS — Z8546 Personal history of malignant neoplasm of prostate: Secondary | ICD-10-CM

## 2018-11-06 DIAGNOSIS — D6481 Anemia due to antineoplastic chemotherapy: Secondary | ICD-10-CM

## 2018-11-06 DIAGNOSIS — Z5111 Encounter for antineoplastic chemotherapy: Secondary | ICD-10-CM | POA: Diagnosis not present

## 2018-11-06 DIAGNOSIS — Z87891 Personal history of nicotine dependence: Secondary | ICD-10-CM

## 2018-11-06 LAB — CEA (IN HOUSE-CHCC): CEA (CHCC-In House): 1237.7 ng/mL — ABNORMAL HIGH (ref 0.00–5.00)

## 2018-11-06 MED ORDER — SODIUM CHLORIDE 0.9 % IV SOLN
Freq: Once | INTRAVENOUS | Status: AC
  Administered 2018-11-06: 10:00:00 via INTRAVENOUS
  Filled 2018-11-06: qty 250

## 2018-11-06 MED ORDER — DIPHENHYDRAMINE HCL 25 MG PO CAPS
25.0000 mg | ORAL_CAPSULE | Freq: Once | ORAL | Status: AC
  Administered 2018-11-06: 25 mg via ORAL

## 2018-11-06 MED ORDER — DIPHENHYDRAMINE HCL 25 MG PO CAPS
ORAL_CAPSULE | ORAL | Status: AC
  Filled 2018-11-06: qty 2

## 2018-11-06 MED ORDER — ACETAMINOPHEN 325 MG PO TABS
650.0000 mg | ORAL_TABLET | Freq: Once | ORAL | Status: AC
  Administered 2018-11-06: 650 mg via ORAL

## 2018-11-06 MED ORDER — DEXAMETHASONE SODIUM PHOSPHATE 10 MG/ML IJ SOLN
INTRAMUSCULAR | Status: AC
  Filled 2018-11-06: qty 1

## 2018-11-06 MED ORDER — OXALIPLATIN CHEMO INJECTION 100 MG/20ML
150.0000 mg | Freq: Once | INTRAVENOUS | Status: AC
  Administered 2018-11-06: 150 mg via INTRAVENOUS
  Filled 2018-11-06: qty 20

## 2018-11-06 MED ORDER — HEPARIN SOD (PORK) LOCK FLUSH 100 UNIT/ML IV SOLN
500.0000 [IU] | Freq: Once | INTRAVENOUS | Status: AC | PRN
  Administered 2018-11-06: 500 [IU]
  Filled 2018-11-06: qty 5

## 2018-11-06 MED ORDER — SODIUM CHLORIDE 0.9% FLUSH
10.0000 mL | INTRAVENOUS | Status: DC | PRN
  Administered 2018-11-06: 10 mL
  Filled 2018-11-06: qty 10

## 2018-11-06 MED ORDER — SODIUM CHLORIDE 0.9% FLUSH
10.0000 mL | INTRAVENOUS | Status: AC | PRN
  Filled 2018-11-06: qty 10

## 2018-11-06 MED ORDER — ACETAMINOPHEN 325 MG PO TABS
ORAL_TABLET | ORAL | Status: AC
  Filled 2018-11-06: qty 2

## 2018-11-06 MED ORDER — PALONOSETRON HCL INJECTION 0.25 MG/5ML
0.2500 mg | Freq: Once | INTRAVENOUS | Status: AC
  Administered 2018-11-06: 0.25 mg via INTRAVENOUS

## 2018-11-06 MED ORDER — PALONOSETRON HCL INJECTION 0.25 MG/5ML
INTRAVENOUS | Status: AC
  Filled 2018-11-06: qty 5

## 2018-11-06 MED ORDER — LIDOCAINE-PRILOCAINE 2.5-2.5 % EX CREA: TOPICAL_CREAM | CUTANEOUS | 3 refills | Status: DC

## 2018-11-06 MED ORDER — TRASTUZUMAB CHEMO 150 MG IV SOLR
6.0000 mg/kg | Freq: Once | INTRAVENOUS | Status: AC
  Administered 2018-11-06: 357 mg via INTRAVENOUS
  Filled 2018-11-06: qty 17

## 2018-11-06 MED ORDER — DEXAMETHASONE SODIUM PHOSPHATE 10 MG/ML IJ SOLN
10.0000 mg | Freq: Once | INTRAMUSCULAR | Status: AC
  Administered 2018-11-06: 10 mg via INTRAVENOUS

## 2018-11-06 MED ORDER — DEXTROSE 5 % IV SOLN
Freq: Once | INTRAVENOUS | Status: AC
  Administered 2018-11-06: 13:00:00 via INTRAVENOUS
  Filled 2018-11-06: qty 250

## 2018-11-06 NOTE — Telephone Encounter (Signed)
No los, spoke to GM, gave avs and calendar

## 2018-11-06 NOTE — Patient Instructions (Signed)
[facility] Discharge Instructions for Patients Receiving Chemotherapy  Today you received the following chemotherapy agents Trastuzumab (Herceptin)  Oxaliplatin  To help prevent nausea and vomiting after your treatment, we encourage you to take your nausea medication as prescribed.   If you develop nausea and vomiting that is not controlled by your nausea medication, call the clinic.   BELOW ARE SYMPTOMS THAT SHOULD BE REPORTED IMMEDIATELY:  *FEVER GREATER THAN 100.5 F  *CHILLS WITH OR WITHOUT FEVER  NAUSEA AND VOMITING THAT IS NOT CONTROLLED WITH YOUR NAUSEA MEDICATION  *UNUSUAL SHORTNESS OF BREATH  *UNUSUAL BRUISING OR BLEEDING  TENDERNESS IN MOUTH AND THROAT WITH OR WITHOUT PRESENCE OF ULCERS  *URINARY PROBLEMS  *BOWEL PROBLEMS  UNUSUAL RASH Items with * indicate a potential emergency and should be followed up as soon as possible.  Feel free to call the clinic should you have any questions or concerns. The clinic phone number is [phone].  Please show the [facility] at check-in to the Emergency Department and triage nurse.  Trastuzumab injection for infusion What is this medicine? TRASTUZUMAB (tras TOO zoo mab) is a monoclonal antibody. It is used to treat breast cancer and stomach cancer. This medicine may be used for other purposes; ask your health care provider or pharmacist if you have questions. COMMON BRAND NAME(S): Herceptin, Calla Kicks, OGIVRI What should I tell my health care provider before I take this medicine? They need to know if you have any of these conditions: -heart disease -heart failure -lung or breathing disease, like asthma -an unusual or allergic reaction to trastuzumab, benzyl alcohol, or other medications, foods, dyes, or preservatives -pregnant or trying to get pregnant -breast-feeding How should I use this medicine? This drug is given as an infusion into a vein. It is administered in a hospital or clinic by a  specially trained health care professional. Talk to your pediatrician regarding the use of this medicine in children. This medicine is not approved for use in children. Overdosage: If you think you have taken too much of this medicine contact a poison control center or emergency room at once. NOTE: This medicine is only for you. Do not share this medicine with others. What if I miss a dose? It is important not to miss a dose. Call your doctor or health care professional if you are unable to keep an appointment. What may interact with this medicine? This medicine may interact with the following medications: -certain types of chemotherapy, such as daunorubicin, doxorubicin, epirubicin, and idarubicin This list may not describe all possible interactions. Give your health care provider a list of all the medicines, herbs, non-prescription drugs, or dietary supplements you use. Also tell them if you smoke, drink alcohol, or use illegal drugs. Some items may interact with your medicine. What should I watch for while using this medicine? Visit your doctor for checks on your progress. Report any side effects. Continue your course of treatment even though you feel ill unless your doctor tells you to stop. Call your doctor or health care professional for advice if you get a fever, chills or sore throat, or other symptoms of a cold or flu. Do not treat yourself. Try to avoid being around people who are sick. You may experience fever, chills and shaking during your first infusion. These effects are usually mild and can be treated with other medicines. Report any side effects during the infusion to your health care professional. Fever and chills usually do not happen with later infusions.  Do not become pregnant while taking this medicine or for 7 months after stopping it. Women should inform their doctor if they wish to become pregnant or think they might be pregnant. Women of child-bearing potential will need to  have a negative pregnancy test before starting this medicine. There is a potential for serious side effects to an unborn child. Talk to your health care professional or pharmacist for more information. Do not breast-feed an infant while taking this medicine or for 7 months after stopping it. Women must use effective birth control with this medicine. What side effects may I notice from receiving this medicine? Side effects that you should report to your doctor or health care professional as soon as possible: -allergic reactions like skin rash, itching or hives, swelling of the face, lips, or tongue -chest pain or palpitations -cough -dizziness -feeling faint or lightheaded, falls -fever -general ill feeling or flu-like symptoms -signs of worsening heart failure like breathing problems; swelling in your legs and feet -unusually weak or tired Side effects that usually do not require medical attention (report to your doctor or health care professional if they continue or are bothersome): -bone pain -changes in taste -diarrhea -joint pain -nausea/vomiting -weight loss This list may not describe all possible side effects. Call your doctor for medical advice about side effects. You may report side effects to FDA at 1-800-FDA-1088. Where should I keep my medicine? This drug is given in a hospital or clinic and will not be stored at home. NOTE: This sheet is a summary. It may not cover all possible information. If you have questions about this medicine, talk to your doctor, pharmacist, or health care provider.  2019 Elsevier/Gold Standard (2016-08-22 14:37:52)  Oxaliplatin Injection What is this medicine? OXALIPLATIN (ox AL i PLA tin) is a chemotherapy drug. It targets fast dividing cells, like cancer cells, and causes these cells to die. This medicine is used to treat cancers of the colon and rectum, and many other cancers. This medicine may be used for other purposes; ask your health care  provider or pharmacist if you have questions. COMMON BRAND NAME(S): Eloxatin What should I tell my health care provider before I take this medicine? They need to know if you have any of these conditions: -kidney disease -an unusual or allergic reaction to oxaliplatin, other chemotherapy, other medicines, foods, dyes, or preservatives -pregnant or trying to get pregnant -breast-feeding How should I use this medicine? This drug is given as an infusion into a vein. It is administered in a hospital or clinic by a specially trained health care professional. Talk to your pediatrician regarding the use of this medicine in children. Special care may be needed. Overdosage: If you think you have taken too much of this medicine contact a poison control center or emergency room at once. NOTE: This medicine is only for you. Do not share this medicine with others. What if I miss a dose? It is important not to miss a dose. Call your doctor or health care professional if you are unable to keep an appointment. What may interact with this medicine? -medicines to increase blood counts like filgrastim, pegfilgrastim, sargramostim -probenecid -some antibiotics like amikacin, gentamicin, neomycin, polymyxin B, streptomycin, tobramycin -zalcitabine Talk to your doctor or health care professional before taking any of these medicines: -acetaminophen -aspirin -ibuprofen -ketoprofen -naproxen This list may not describe all possible interactions. Give your health care provider a list of all the medicines, herbs, non-prescription drugs, or dietary supplements you use. Also tell  them if you smoke, drink alcohol, or use illegal drugs. Some items may interact with your medicine. What should I watch for while using this medicine? Your condition will be monitored carefully while you are receiving this medicine. You will need important blood work done while you are taking this medicine. This medicine can make you more  sensitive to cold. Do not drink cold drinks or use ice. Cover exposed skin before coming in contact with cold temperatures or cold objects. When out in cold weather wear warm clothing and cover your mouth and nose to warm the air that goes into your lungs. Tell your doctor if you get sensitive to the cold. This drug may make you feel generally unwell. This is not uncommon, as chemotherapy can affect healthy cells as well as cancer cells. Report any side effects. Continue your course of treatment even though you feel ill unless your doctor tells you to stop. In some cases, you may be given additional medicines to help with side effects. Follow all directions for their use. Call your doctor or health care professional for advice if you get a fever, chills or sore throat, or other symptoms of a cold or flu. Do not treat yourself. This drug decreases your body's ability to fight infections. Try to avoid being around people who are sick. This medicine may increase your risk to bruise or bleed. Call your doctor or health care professional if you notice any unusual bleeding. Be careful brushing and flossing your teeth or using a toothpick because you may get an infection or bleed more easily. If you have any dental work done, tell your dentist you are receiving this medicine. Avoid taking products that contain aspirin, acetaminophen, ibuprofen, naproxen, or ketoprofen unless instructed by your doctor. These medicines may hide a fever. Do not become pregnant while taking this medicine. Women should inform their doctor if they wish to become pregnant or think they might be pregnant. There is a potential for serious side effects to an unborn child. Talk to your health care professional or pharmacist for more information. Do not breast-feed an infant while taking this medicine. Call your doctor or health care professional if you get diarrhea. Do not treat yourself. What side effects may I notice from receiving this  medicine? Side effects that you should report to your doctor or health care professional as soon as possible: -allergic reactions like skin rash, itching or hives, swelling of the face, lips, or tongue -low blood counts - This drug may decrease the number of white blood cells, red blood cells and platelets. You may be at increased risk for infections and bleeding. -signs of infection - fever or chills, cough, sore throat, pain or difficulty passing urine -signs of decreased platelets or bleeding - bruising, pinpoint red spots on the skin, black, tarry stools, nosebleeds -signs of decreased red blood cells - unusually weak or tired, fainting spells, lightheadedness -breathing problems -chest pain, pressure -cough -diarrhea -jaw tightness -mouth sores -nausea and vomiting -pain, swelling, redness or irritation at the injection site -pain, tingling, numbness in the hands or feet -problems with balance, talking, walking -redness, blistering, peeling or loosening of the skin, including inside the mouth -trouble passing urine or change in the amount of urine Side effects that usually do not require medical attention (report to your doctor or health care professional if they continue or are bothersome): -changes in vision -constipation -hair loss -loss of appetite -metallic taste in the mouth or changes in taste -stomach pain  This list may not describe all possible side effects. Call your doctor for medical advice about side effects. You may report side effects to FDA at 1-800-FDA-1088. Where should I keep my medicine? This drug is given in a hospital or clinic and will not be stored at home. NOTE: This sheet is a summary. It may not cover all possible information. If you have questions about this medicine, talk to your doctor, pharmacist, or health care provider.  2019 Elsevier/Gold Standard (2008-03-24 17:22:47)

## 2018-11-06 NOTE — Telephone Encounter (Signed)
Oral Oncology Patient Advocate Encounter  Confirmed with [place] that Xeloda was picked up on 11/05/18 with a $0 copay using cancer care grant.   [facility] Patient [facility] [place] Phone [phone] Fax [phone] 11/06/2018   9:22 AM

## 2018-11-06 NOTE — Progress Notes (Signed)
Spoke with Dr. Jana Hakim. No Herceptin load today. Proceed with Herceptin 6mg /kg. Infuse over 76minutes.  Hardie Pulley, PharmD, BCPS, BCOP

## 2018-11-07 ENCOUNTER — Telehealth: Payer: Self-pay

## 2018-11-07 NOTE — Telephone Encounter (Signed)
Spoke with patient's wife regarding patient's first time chemo.  Pt is feeling well today, no fevers.  Appetite and energy levels are great per pt and wife.  Denies any nausea, or diarrhea.  All questions were answered.  No further needs at this time.

## 2018-11-11 ENCOUNTER — Telehealth: Payer: Self-pay | Admitting: Pharmacist

## 2018-11-11 ENCOUNTER — Other Ambulatory Visit: Payer: Self-pay | Admitting: Oncology

## 2018-11-11 NOTE — Telephone Encounter (Signed)
Oral Oncology Pharmacist Encounter  Received call from patient over the weekend with questions about new treatment regimen for gastric cancer. Patient started first cycle of [place] Ox plus Herceptin on 11/06/2018.  I returned call to patient for further description of issues he may be experiencing. Patient states he started having lower extremity edema, bilaterally, after first infusion. This is starting to subside with the use of compression stockings, feet elevation, and using shorter socks while he is sleeping. Patient denies any change in breathing and denies any increase in shortness of breath.  Patient states he had episodes of emesis on 11/06/2018 and 11/07/2018. He states he took 1 dose of prochlorperazine and this was not helpful for his symptoms. His appetite is slowly getting better and he has been able to tolerate food intake since Friday.  Calorie intake is increasing each day. We reviewed how to take prochlorperazine and ondansetron. Message has also been sent to MD to adjust anti-emetics with next cycle of oxaliplatin and Herceptin, if needed.  Patient states he is experiencing cold sensitivity since his first infusion of oxaliplatin.  It is still persisting as of today (almost 5 days later). We discussed letting his wife handle any cold food or drinks until this subsides.  He will let the office know how long his cold sensitivity persists.  Patient's appointment schedule still matches schedule required for previous regimen of gemcitabine and Abraxane. Treatment schedule needs to be updated for new treatment regimen. Message has been sent to collaborative practice RN to follow-up with MD and schedulers so that appointment schedule can be updated to current treatment regimen.  All questions answered. Patient knows to call the office with any additional questions or concerns.  Johny Drilling, PharmD, BCPS, BCOP  11/11/2018 9:46 AM Oral Oncology Clinic [phone]

## 2018-11-12 ENCOUNTER — Ambulatory Visit: Payer: Medicare Other | Admitting: Internal Medicine

## 2018-11-12 ENCOUNTER — Other Ambulatory Visit: Payer: Self-pay | Admitting: Pharmacist

## 2018-11-12 ENCOUNTER — Encounter: Payer: Self-pay | Admitting: Internal Medicine

## 2018-11-12 DIAGNOSIS — D630 Anemia in neoplastic disease: Secondary | ICD-10-CM

## 2018-11-12 DIAGNOSIS — D649 Anemia, unspecified: Secondary | ICD-10-CM | POA: Insufficient documentation

## 2018-11-12 DIAGNOSIS — C160 Malignant neoplasm of cardia: Secondary | ICD-10-CM

## 2018-11-12 DIAGNOSIS — I2699 Other pulmonary embolism without acute cor pulmonale: Secondary | ICD-10-CM | POA: Diagnosis not present

## 2018-11-12 DIAGNOSIS — R634 Abnormal weight loss: Secondary | ICD-10-CM

## 2018-11-12 DIAGNOSIS — R109 Unspecified abdominal pain: Secondary | ICD-10-CM | POA: Diagnosis not present

## 2018-11-12 DIAGNOSIS — C799 Secondary malignant neoplasm of unspecified site: Secondary | ICD-10-CM | POA: Diagnosis not present

## 2018-11-12 DIAGNOSIS — R110 Nausea: Secondary | ICD-10-CM

## 2018-11-12 DIAGNOSIS — C787 Secondary malignant neoplasm of liver and intrahepatic bile duct: Secondary | ICD-10-CM

## 2018-11-12 DIAGNOSIS — C169 Malignant neoplasm of stomach, unspecified: Secondary | ICD-10-CM

## 2018-11-12 NOTE — Assessment & Plan Note (Signed)
No relapse 

## 2018-11-12 NOTE — Progress Notes (Signed)
[place]  Telephone:([phone] Fax:(336) [phone]     ID: Peter Wood DOB: [birth year]  MR#: [id number]  TKP#:[id number]  Patient Care Team: Cassandria Anger, MD as PCP - General Milus Banister, MD as Attending Physician (Gastroenterology) Alexiah Koroma, Virgie Dad, MD as Consulting Physician (Oncology) Gatha Mayer, MD as Consulting Physician (Gastroenterology) [name], MD as Consulting Physician (Ophthalmology) Ramon Dredge, MD as Referring Physician (Radiation Oncology) OTHER MD:   CHIEF COMPLAINT: Metastatic gastric adenocarcinoma  CURRENT TREATMENT: oxaliplatin, capecitabine, trastuzumab   INTERVAL HISTORY: Peter Wood returns today for follow-up and treatment of his metastatic gastric adenocarcinoma. He is accompanied by his wife.  He started oxaliplatin and trastuzumab 11/06/2018. Today is day 8 cycle 1. At the beginning, he had some nausea, vomiting, and swelling of the ankles. He said that he feels better now as he "has gotten used to the treatment." He notes that the vomited was due to mixing his pills with Boost. He did not take anything for the nausea. He also still has some cold sensitivity.   He also continues on capecitabine.  He is receiving this at 1000 mg twice daily for 14 days, also started on 11/06/2018. He feels some fatigued.    Peter Wood's last echocardiogram on 09/20/2018, showed an ejection fraction in the 55% - 60% range.   Since his last visit here, he has not undergone any additional studies.    Results for Peter Wood, Peter Wood (MRN [id number]) as of 11/13/2018 11:28  Ref. Range 03/11/2012 08:45 03/11/2012 08:45 09/18/2018 10:58 10/23/2018 10:17 11/06/2018 11:05  AFP Tumor Marker Latest Ref Range: <6.1 ng/mL   2.9    CA 19-9 Latest Ref Range: <34 U/mL   67,479 (H)    CA 19-9 Latest Ref Range: 0 - 35 U/mL    75,452 (H)   CEA Latest Units: ng/mL   1,318.2 (H)    CEA ([facility]) Latest Ref Range: 0.00 - 5.00 ng/mL     1,237.70  (H)     REVIEW OF SYSTEMS: Peter Wood's appetite has been great; he notes that he ate two meals for breakfast. He is a little consitpated. He has not had mouth sores. His wife notes that his breathing seems lightly labored at night while he's sleeping, particularly while he's laying on his back. He also notes that his balance has been good and that he is able to walk some distance as long as he takes it slow. The patient denies unusual headaches, visual changes, nausea, vomiting, or dizziness. There has been no unusual cough, phlegm production, or pleurisy. This been no change in bowel or bladder habits. The patient denies unexplained fatigue or unexplained weight loss, bleeding, rash, or fever. A detailed review of systems was otherwise noncontributory.    HISTORY OF CURRENT ILLNESS: From the original intake note:  Peter Wood presented to the emergency room 09/07/2018 with mid/upper abdominal pain.  He reported weight loss of 25 pounds over the past 6 months and significant anorexia.  His liver function tests were abnormal and a right upper quadrant ultrasound was obtained.  This was read as concerning for malignancy and a CT of the abdomen and pelvis with contrast was obtained the same day, showing  1. Too numerous to count hypodense masses of the liver concerning for hepatic metastasis. In the region of the porta hepatis, there are hypodense lesions more likely associated with the liver though the pancreatic head is partially obscured by a 3.2 cm hypodense mass. Findings are  more likely associated with the liver and less likely an isolated pancreatic mass. 2. Upper abdominal mesenteric and peripancreatic lymphadenopathy suspicious for metastatic disease. 3. 6 mm sclerotic density in the left iliac bone adjacent to the SI joint. Given history of prostate cancer, differential considerations would include osteoblastic metastasis or possibly bone island. Showed only diverticular disease.  He was  referred to GI and Dr. Ardis Hughs notes a colonoscopy performed 04/23/2017 makes the possibility of colon cancer unlikely.  Further work-up was planned, but on 09/19/2018 the patient again presented to the emergency room with chest pain, now in a different area, and a staging CT scan of the chest showed filling defects in the right lower lobe pulmonary arteries consistent with embolism.  There was a small pericardial effusion and an additional 2.4 cm left thyroid mass.  Aortic atherosclerosis was noted  He was started on intravenous heparin and on 09/20/2018 underwent upper endoscopy under Dr. Carlean Purl showing a giant nonbleeding cratered gastric ulcer in the posterior wall of the stomach.  Biopsies of this procedure showed (SZA 20-187) adenocarcinoma.    At that point we were consulted and we obtained tumor markers which included a CEA of 1318.2, a CA 19-9 of 67,479, a normal AFP and a normal PSA.  I discussed the case with pathology and they do not feel that additional testing would be helpful in terms of discriminating between a primary gastric and a primary biliary/pancreatic tumor.  The patient's subsequent history is as detailed below.   PAST MEDICAL HISTORY: Past Medical History:  Diagnosis Date  . Arthritis   . Diverticulosis of colon   . Family history of breast cancer   . Family history of prostate cancer   . HTN (hypertension)   . Hx of colonic polyp   . Pneumonia    as a child/baby  . Poor circulation of extremity    right leg  . Prostate cancer ([place]) 2015   prostate cancer - radiation treated     PAST SURGICAL HISTORY: Past Surgical History:  Procedure Laterality Date  . [place] VITRECTOMY WITH 20 GAUGE MVR PORT FOR MACULAR HOLE Right 09/19/2016   Procedure: 25 GAUGE PARS PLANA VITRECTOMY WITH 20 GAUGE MVR PORT FOR MACULAR HOLE, MEMBRANE PEEL, SERUM PATCH, GAS FLUID EXCHANGE, HEADSCOPE LASER;  Surgeon: [name], MD;  Location: [place];  Service: Ophthalmology;   Laterality: Right;  . BIOPSY  09/20/2018   Procedure: BIOPSY;  Surgeon: Gatha Mayer, MD;  Location: [facility] ENDOSCOPY;  Service: Endoscopy;;  . CATARACT EXTRACTION  08/27/12   Right  . COLONOSCOPY    . ESOPHAGOGASTRODUODENOSCOPY (EGD) WITH PROPOFOL N/A 09/20/2018   Procedure: ESOPHAGOGASTRODUODENOSCOPY (EGD) WITH PROPOFOL;  Surgeon: Gatha Mayer, MD;  Location: [place];  Service: Endoscopy;  Laterality: N/A;  . HAND SURGERY  2016   right thumb  . IR IMAGING GUIDED PORT INSERTION  11/04/2018  . KNEE SURGERY Left 1989     FAMILY HISTORY: Family History  Problem Relation Age of Onset  . Prostate cancer Father        dx >50  . Hypertension Mother   . Ulcers Mother   . Breast cancer Sister        dx 20's  . Prostate cancer Brother        dx under 43, metasttic, cause of his death  . Ulcers Brother   . Ulcers Sister    Graves's father died from heart complications with a pacemaker at age 43; Harjot's  father also has prostate cancer. Patients' mother died from natural causes at age 60. The patient has 4 brothers and 3 sisters. One of [place] sisters had breast cancer in her 84s and a brother had prostate cancer. Patient denies anyone in her family having ovarian, or pancreatic cancer. Adarsh mentions that his youngest sister and his brother with prostate cancer also had stomach ulcers.    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 His wife, [name], is a retired [employer] summer Radiographer, therapeutic. As of 09/2018, they have been married for 56 years. They have 4 children, Peter Wood, Peter Wood, Peter Wood, and Peter Wood. Peter Wood lives in [place] and works at the [employer]. Peter Wood lives in [place] and works at [employer] and as a part Horticulturist, commercial. 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 Peter Wood lives in [place] and works at [employer]. Peter Wood has 7 grandchildren and 1 step great grandchild. He attends [employer].     ADVANCED  DIRECTIVES: His wife, [name], is automatically his healthcare power of attorney.     HEALTH MAINTENANCE: Social History   Tobacco Use  . Smoking status: Former Smoker    Years: 4.00    Last attempt to quit: 09/12/1967    Years since quitting: 51.2  . Smokeless tobacco: Never Used  Substance Use Topics  . Alcohol use: No  . Drug use: No    Colonoscopy: 2018/[facility]  PSA: 0.54 on 09/18/2018  No Known Allergies  Current Outpatient Medications  Medication Sig Dispense Refill  . amLODipine (NORVASC) 5 MG tablet Take 1 tablet (5 mg total) daily by mouth. 90 tablet 3  . apixaban (ELIQUIS) 5 MG TABS tablet Take 1 tablet (5 mg total) by mouth 2 (two) times daily. 60 tablet 11  . benazepril (LOTENSIN) 40 MG tablet TAKE ONE TABLET BY MOUTH ONE TIME DAILY  90 tablet 1  . capecitabine (XELODA) 500 MG tablet Take 2 tablets (1,000 mg total) by mouth 2 (two) times daily after a meal. Take on days 1-14 of chemotherapy. Repeat every 21 days. 56 tablet 4  . Cholecalciferol 1000 UNITS tablet Take 1,000 Units by mouth daily.     Marland Kitchen latanoprost (XALATAN) 0.005 % ophthalmic solution Place 1 drop into both eyes at bedtime.     . lidocaine-prilocaine (EMLA) cream Apply to affected area once 30 g 3  . naproxen sodium (ALEVE) 220 MG tablet Take 220-440 mg by mouth 2 (two) times daily as needed (for pain).    . ondansetron (ZOFRAN) 4 MG tablet Take 1 tablet (4 mg total) by mouth every 8 (eight) hours as needed for nausea or vomiting. 20 tablet 1  . pantoprazole (PROTONIX) 40 MG tablet Take 1 tablet (40 mg total) by mouth 2 (two) times daily before a meal. 60 tablet 0  . prochlorperazine (COMPAZINE) 10 MG tablet Take 1 tablet (10 mg total) by mouth every 6 (six) hours as needed (Nausea or vomiting). 30 tablet 1   No current facility-administered medications for this visit.    Facility-Administered Medications Ordered in Other Visits  Medication Dose Route Frequency Provider Last Rate Last Dose  . sodium  chloride flush (NS) 0.9 % injection 10 mL  10 mL Intravenous PRN Kylinn Shropshire, Virgie Dad, MD         OBJECTIVE: Older African-American man who appears stated age  31:   11/13/18 1120  BP: (!) 141/83  Pulse: 72  Resp: 18  Temp: 98.9 F (37.2 C)  SpO2: 100%     Body mass index is 22.73 kg/m.   Wt Readings from Last 3 Encounters:  11/13/18 134 lb 8 oz (61 kg)  11/12/18 134 lb (60.8 kg)  11/06/18 136 lb 1.6 oz (61.7 kg)  ECOG FS:2 - Symptomatic, <50% confined to bed  Sclerae unicteric, pupils round and equal Oropharynx clear and moist, no sores or thrush No cervical or supraclavicular adenopathy Lungs no rales or rhonchi Heart regular rate and rhythm Abd soft, nontender, positive bowel sounds, no masses palpated MSK no focal spinal tenderness no lower extremity lymphedema Neuro: nonfocal, well oriented, positive affect Skin: Mild acral hyper pigmentation, no peeling  LAB RESULTS:  CMP     Component Value Date/Time   NA 139 11/04/2018 1021   K 3.6 11/04/2018 1021   CL 105 11/04/2018 1021   CO2 26 11/04/2018 1021   GLUCOSE 92 11/04/2018 1021   GLUCOSE 105 (H) 09/13/2006 0900   BUN 13 11/04/2018 1021   CREATININE 0.86 11/04/2018 1021   CREATININE 1.02 10/16/2018 0936   CALCIUM 8.7 (L) 11/04/2018 1021   PROT 6.6 11/04/2018 1021   ALBUMIN 3.5 11/04/2018 1021   AST 56 (H) 11/04/2018 1021   AST 76 (H) 10/16/2018 0936   ALT 53 (H) 11/04/2018 1021   ALT 47 (H) 10/16/2018 0936   ALKPHOS 135 (H) 11/04/2018 1021   BILITOT 0.9 11/04/2018 1021   BILITOT 0.7 10/16/2018 0936   GFRNONAA >60 11/04/2018 1021   GFRNONAA >60 10/16/2018 0936   GFRAA >60 11/04/2018 1021   GFRAA >60 10/16/2018 0936    No results found for: TOTALPROTELP, ALBUMINELP, A1GS, A2GS, BETS, BETA2SER, GAMS, MSPIKE, SPEI  No results found for: KPAFRELGTCHN, LAMBDASER, KAPLAMBRATIO  Lab Results  Component Value Date   WBC 4.3 11/13/2018   NEUTROABS 2.5 11/13/2018   HGB 8.7 (L) 11/13/2018   HCT 28.0 (L)  11/13/2018   MCV 96.6 11/13/2018   PLT 583 (H) 11/13/2018    '@LASTCHEMISTRY'$ @  No results found for: LABCA2  No components found for: AOZHYQ657  No results for input(s): INR in the last 168 hours.  No results found for: LABCA2  Lab Results  Component Value Date   CAN199 75,452 (H) 10/23/2018    No results found for: QIO962  No results found for: XBM841  No results found for: CA2729  No components found for: HGQUANT  Lab Results  Component Value Date   CEA1 1,237.70 (H) 11/06/2018   /  CEA ([facility])  Date Value Ref Range Status  11/06/2018 1,237.70 (H) 0.00 - 5.00 ng/mL Final    Comment:    (NOTE) This test was performed using Architect's Chemiluminescent Microparticle Immunoassay. Values obtained from different assay methods cannot be used interchangeably. Please note that 5-10% of patients who smoke may see CEA levels up to 6.9 ng/mL. Performed at [facility], [place] [address] [place], [place] [address]      No results found for: AFPTUMOR  No results found for: [place]  No results found for: PSA1  Appointment on 11/13/2018  Component Date Value Ref Range Status  . WBC 11/13/2018 4.3  4.0 - 10.5 K/uL Final  . RBC 11/13/2018 2.90* 4.22 - 5.81 MIL/uL Final  . Hemoglobin 11/13/2018 8.7* 13.0 - 17.0 g/dL Final  . HCT 11/13/2018 28.0* 39.0 - 52.0 % Final  . MCV 11/13/2018 96.6  80.0 - 100.0 fL Final  . MCH 11/13/2018 30.0  26.0 - 34.0 pg Final  . MCHC 11/13/2018 31.1  30.0 - 36.0 g/dL Final  . RDW 11/13/2018 19.2* 11.5 - 15.5 % Final  . Platelets 11/13/2018 583* 150 - 400 K/uL Final  . nRBC 11/13/2018 0.0  0.0 - 0.2 % Final  . Neutrophils Relative % 11/13/2018 58  % Final  . Neutro Abs 11/13/2018 2.5  1.7 - 7.7 K/uL Final  . Lymphocytes Relative 11/13/2018 24  % Final  . Lymphs Abs 11/13/2018 1.0  0.7 - 4.0 K/uL Final  . Monocytes Relative 11/13/2018 15  % Final  . Monocytes Absolute 11/13/2018 0.7  0.1 - 1.0 K/uL Final    . Eosinophils Relative 11/13/2018 1  % Final  . Eosinophils Absolute 11/13/2018 0.1  0.0 - 0.5 K/uL Final  . Basophils Relative 11/13/2018 1  % Final  . Basophils Absolute 11/13/2018 0.0  0.0 - 0.1 K/uL Final  . Immature Granulocytes 11/13/2018 1  % Final  . Abs Immature Granulocytes 11/13/2018 0.05  0.00 - 0.07 K/uL Final   Performed at [facility], [address] [place], [place] [address]    (this displays the last labs from the last 3 days)  No results found for: TOTALPROTELP, ALBUMINELP, A1GS, A2GS, BETS, BETA2SER, GAMS, MSPIKE, SPEI (this displays SPEP labs)  No results found for: KPAFRELGTCHN, LAMBDASER, KAPLAMBRATIO (kappa/lambda light chains)  No results found for: HGBA, HGBA2QUANT, HGBFQUANT, HGBSQUAN (Hemoglobinopathy evaluation)   No results found for: LDH  Lab Results  Component Value Date   IRON 38 (L) 09/21/2018   TIBC 251 09/21/2018   IRONPCTSAT 15 (L) 09/21/2018   (Iron and TIBC)  Lab Results  Component Value Date   FERRITIN 138 09/21/2018    Urinalysis    Component Value Date/Time   COLORURINE YELLOW 09/22/2018 [place] 09/22/2018 0541   LABSPEC 1.014 09/22/2018 0541   PHURINE 6.0 09/22/2018 0541   GLUCOSEU NEGATIVE 09/22/2018 0541   GLUCOSEU NEGATIVE 07/20/2017 1056   HGBUR NEGATIVE 09/22/2018 0541   BILIRUBINUR NEGATIVE 09/22/2018 0541   KETONESUR 5 (A) 09/22/2018 0541   PROTEINUR NEGATIVE 09/22/2018 0541   UROBILINOGEN 0.2 07/20/2017 1056   NITRITE NEGATIVE 09/22/2018 0541   LEUKOCYTESUR NEGATIVE 09/22/2018 0541     STUDIES:  Ir Imaging Guided Port Insertion  Result Date: 11/04/2018 INDICATION: 84 year old male with a history of metastatic pancreatic carcinoma EXAM: IMPLANTED PORT A CATH PLACEMENT WITH ULTRASOUND AND FLUOROSCOPIC GUIDANCE MEDICATIONS: 2.0 g Ancef; The antibiotic was administered within an appropriate time interval prior to skin puncture. ANESTHESIA/SEDATION: Moderate (conscious)  sedation was employed during this procedure. A total of Versed 1.5 mg and Fentanyl 25 mcg was administered intravenously. Moderate Sedation Time: 19 minutes. The patient's level of consciousness and vital signs were monitored continuously by radiology nursing throughout the procedure under my direct supervision. FLUOROSCOPY TIME:  0 minutes, 12 seconds (1.0 mGy) COMPLICATIONS: None PROCEDURE: The procedure, risks, benefits, and alternatives were explained to the patient. Questions regarding the procedure were encouraged and answered. The patient understands and consents to the procedure. Ultrasound survey was performed with images stored and sent to PACs. The right neck and chest was prepped with chlorhexidine, and draped in the usual sterile fashion using maximum barrier technique (cap and mask, sterile gown, sterile gloves, large sterile sheet, hand hygiene and cutaneous antiseptic). Antibiotic prophylaxis was provided with 2.0g Ancef administered IV one hour prior to skin incision. Local anesthesia was attained by infiltration with 1% lidocaine without epinephrine. Ultrasound demonstrated patency of the right internal jugular vein, and this was documented with an  image. Under real-time ultrasound guidance, this vein was accessed with a 21 gauge micropuncture needle and image documentation was performed. A small dermatotomy was made at the access site with an 11 scalpel. A 0.018" wire was advanced into the SVC and used to estimate the length of the internal catheter. The access needle exchanged for a 26F micropuncture vascular sheath. The 0.018" wire was then removed and a 0.035" wire advanced into the IVC. An appropriate location for the subcutaneous reservoir was selected below the clavicle and an incision was made through the skin and underlying soft tissues. The subcutaneous tissues were then dissected using a combination of blunt and sharp surgical technique and a pocket was formed. A single lumen power  injectable portacatheter was then tunneled through the subcutaneous tissues from the pocket to the dermatotomy and the port reservoir placed within the subcutaneous pocket. The venous access site was then serially dilated and a peel away vascular sheath placed over the wire. The wire was removed and the port catheter advanced into position under fluoroscopic guidance. The catheter tip is positioned in the cavoatrial junction. This was documented with a spot image. The portacatheter was then tested and found to flush and aspirate well. The port was flushed with saline followed by 100 units/mL heparinized saline. The pocket was then closed in two layers using first subdermal inverted interrupted absorbable sutures followed by a running subcuticular suture. The epidermis was then sealed with Dermabond. The dermatotomy at the venous access site was also seal with Dermabond. Patient tolerated the procedure well and remained hemodynamically stable throughout. No complications encountered and no significant blood loss encountered IMPRESSION: Status post right IJ port catheter placement. Signed, Dulcy Fanny. Dellia Nims, RPVI [facility] Rad[facility] Electronically Signed   By: Corrie Mckusick D.O.   On: 11/04/2018 12:32     ELIGIBLE FOR AVAILABLE RESEARCH PROTOCOL: no   ASSESSMENT: 84 y.o. [place], [place] man status post gastric biopsy 09/20/2018 showing adenocarcinoma, with a CA 19-9 greater than 65,000; staging studies showing an apparent mass adjacent to the head of the pancreas, innumerable liver lesions, upper abdominal mesenteric and peripancreatic lymphadenopathy, and an isolated sclerotic density in the left iliac bone  (a) genomics requested on gastric biopsy showed the tumor to be HER-2 amplified at 3+, PD-L1 positive, and T p53 mutated.  MSI is stable, mismatch repair status is proficient and the tumor mutational burden is intermediate.  (1) chest CT scan  09/19/2018 shows right lower lobe pulmonary embolism  (a) on intravenous heparin started 09/19/2018, transitioned to apixaban 10/01/2018  (2) genetics testing 10/04/2018: negative  (a) family history of prostate and early breast cancer; patient has a history of prostate cancer  (3) started gemcitabine/Abraxane 10/09/2018, repeated days 1, 8 and 15 of each 28-day cycle  (a) stopped after 1 cycle (3 doses) with reassessment of diagnosis based on CARIS results, noted above  (4) to start oxaliplatin, capecitabine and trastuzumab 11/06/2018  (a) baseline echocardiogram 09/20/2018 shows an ejection fraction in the 55-60% range  (b) oxaliplatin and trastuzumab will be given every 21 days  (c) capecitabine initial dose will be 1000 mg twice daily for 14 days of every 21-day cycle   PLAN: Jazir did very well with his first cycle of CAPOX/trastuzumab.  He is continuing the capecitabine as before.  He has 6 days to go if he is going to complete his full 14 days.  I have asked him to alert [place] if he develops mouth sores, Palmar plantar  skin changes, or diarrhea.  Certainly we can stop before the 14 days if necessary but at this point I am very pleased at how well he is tolerating all his treatments.  His appetite is improved.  He is doing his best to gain a little bit of weight.  He is maintaining as good functional status as possible  He will see [place] again 11/26/2018 for cycle #2.    Virgie Dad. Sevannah Madia, MD 11/13/18 11:49 AM Medical Oncology and Hematology [facility] [address] [place],  [address] Tel. [phone]    Fax. [phone]   I, Jacqualyn Posey am acting as a Education administrator for Chauncey Cruel, MD.   I, Lurline Del MD, have reviewed the above documentation for accuracy and completeness, and I agree with the above.

## 2018-11-12 NOTE — Assessment & Plan Note (Signed)
F/u w/Dr Magrinat  On Gemcitabine, liposomal paclitaxel (Abraxane)

## 2018-11-12 NOTE — Assessment & Plan Note (Signed)
Monitoring Hgb/CBC

## 2018-11-12 NOTE — Assessment & Plan Note (Signed)
Gemcitabine, liposomal paclitaxel (Abraxane)

## 2018-11-12 NOTE — Assessment & Plan Note (Signed)
Zofran prn

## 2018-11-12 NOTE — Progress Notes (Signed)
Subjective:  Patient ID: Peter Wood, male    DOB: 05/11/1935  Age: 84 y.o. MRN: [id number]  CC: No chief complaint on file.   HPI Peter Wood presents for PE, gastric ca, anticoagulation. No n/v. No abd pain.  Outpatient Medications Prior to Visit  Medication Sig Dispense Refill  . amLODipine (NORVASC) 5 MG tablet Take 1 tablet (5 mg total) daily by mouth. 90 tablet 3  . apixaban (ELIQUIS) 5 MG TABS tablet Take 1 tablet (5 mg total) by mouth 2 (two) times daily. 60 tablet 11  . benazepril (LOTENSIN) 40 MG tablet TAKE ONE TABLET BY MOUTH ONE TIME DAILY  90 tablet 1  . capecitabine (XELODA) 500 MG tablet Take 2 tablets (1,000 mg total) by mouth 2 (two) times daily after a meal. Take on days 1-14 of chemotherapy. Repeat every 21 days. 56 tablet 4  . Cholecalciferol 1000 UNITS tablet Take 1,000 Units by mouth daily.     Marland Kitchen latanoprost (XALATAN) 0.005 % ophthalmic solution Place 1 drop into both eyes at bedtime.     . lidocaine-prilocaine (EMLA) cream Apply to affected area once 30 g 3  . naproxen sodium (ALEVE) 220 MG tablet Take 220-440 mg by mouth 2 (two) times daily as needed (for pain).    . ondansetron (ZOFRAN) 4 MG tablet Take 1 tablet (4 mg total) by mouth every 8 (eight) hours as needed for nausea or vomiting. 20 tablet 1  . pantoprazole (PROTONIX) 40 MG tablet Take 1 tablet (40 mg total) by mouth 2 (two) times daily before a meal. 60 tablet 0  . prochlorperazine (COMPAZINE) 10 MG tablet Take 1 tablet (10 mg total) by mouth every 6 (six) hours as needed (Nausea or vomiting). 30 tablet 1   Facility-Administered Medications Prior to Visit  Medication Dose Route Frequency Provider Last Rate Last Dose  . sodium chloride flush (NS) 0.9 % injection 10 mL  10 mL Intravenous PRN Magrinat, Virgie Dad, MD        ROS: Review of Systems  Constitutional: Positive for fatigue. Negative for appetite change and unexpected weight change.  HENT: Negative for congestion, nosebleeds,  sneezing, sore throat and trouble swallowing.   Eyes: Negative for itching and visual disturbance.  Respiratory: Negative for cough.   Cardiovascular: Positive for leg swelling. Negative for chest pain and palpitations.  Gastrointestinal: Positive for nausea. Negative for abdominal distention, blood in stool and diarrhea.  Genitourinary: Negative for frequency and hematuria.  Musculoskeletal: Negative for back pain, gait problem, joint swelling and neck pain.  Skin: Negative for rash.  Neurological: Negative for dizziness, tremors, speech difficulty and weakness.  Psychiatric/Behavioral: Negative for agitation, decreased concentration, dysphoric mood and sleep disturbance. The patient is not nervous/anxious.     Objective:  BP 132/84 (BP Location: Left Arm, Patient Position: Sitting, Cuff Size: Normal)   Pulse 70   Temp 97.7 F (36.5 C) (Oral)   Ht 5' 4.5" (1.638 m)   Wt 134 lb (60.8 kg)   SpO2 99%   BMI 22.65 kg/m   BP Readings from Last 3 Encounters:  11/12/18 132/84  11/06/18 (!) 108/53  11/04/18 137/81    Wt Readings from Last 3 Encounters:  11/12/18 134 lb (60.8 kg)  11/06/18 136 lb 1.6 oz (61.7 kg)  10/23/18 135 lb (61.2 kg)    Physical Exam Constitutional:      General: He is not in acute distress.    Appearance: He is well-developed.     Comments: NAD  Eyes:     Conjunctiva/sclera: Conjunctivae normal.     Pupils: Pupils are equal, round, and reactive to light.  Neck:     Musculoskeletal: Normal range of motion.     Thyroid: No thyromegaly.     Vascular: No JVD.  Cardiovascular:     Rate and Rhythm: Normal rate and regular rhythm.     Heart sounds: Normal heart sounds. No murmur. No friction rub. No gallop.   Pulmonary:     Effort: Pulmonary effort is normal. No respiratory distress.     Breath sounds: Normal breath sounds. No wheezing or rales.  Chest:     Chest wall: No tenderness.  Abdominal:     General: Bowel sounds are normal. There is no  distension.     Palpations: Abdomen is soft. There is no mass.     Tenderness: There is no abdominal tenderness. There is no guarding or rebound.  Musculoskeletal: Normal range of motion.        General: No tenderness.  Lymphadenopathy:     Cervical: No cervical adenopathy.  Skin:    General: Skin is warm and dry.     Findings: No rash.  Neurological:     Mental Status: He is alert and oriented to person, place, and time.     Cranial Nerves: No cranial nerve deficit.     Motor: No abnormal muscle tone.     Coordination: Coordination normal.     Gait: Gait normal.     Deep Tendon Reflexes: Reflexes are normal and symmetric.  Psychiatric:        Behavior: Behavior normal.        Thought Content: Thought content normal.        Judgment: Judgment normal.   port-a-cath  Lab Results  Component Value Date   WBC 4.4 11/04/2018   HGB 8.0 (L) 11/04/2018   HCT 26.8 (L) 11/04/2018   PLT 395 11/04/2018   GLUCOSE 92 11/04/2018   CHOL 193 01/18/2018   TRIG 84.0 01/18/2018   HDL 50.50 01/18/2018   LDLDIRECT 126.3 09/23/2007   LDLCALC 126 (H) 01/18/2018   ALT 53 (H) 11/04/2018   AST 56 (H) 11/04/2018   NA 139 11/04/2018   K 3.6 11/04/2018   CL 105 11/04/2018   CREATININE 0.86 11/04/2018   BUN 13 11/04/2018   CO2 26 11/04/2018   TSH 0.93 01/18/2018   PSA 0.54 09/18/2018   INR 1.4 11/04/2018    Ir Imaging Guided Port Insertion  Result Date: 11/04/2018 INDICATION: 84 year old male with a history of metastatic pancreatic carcinoma EXAM: IMPLANTED PORT A CATH PLACEMENT WITH ULTRASOUND AND FLUOROSCOPIC GUIDANCE MEDICATIONS: 2.0 g Ancef; The antibiotic was administered within an appropriate time interval prior to skin puncture. ANESTHESIA/SEDATION: Moderate (conscious) sedation was employed during this procedure. A total of Versed 1.5 mg and Fentanyl 25 mcg was administered intravenously. Moderate Sedation Time: 19 minutes. The patient's level of consciousness and vital signs were  monitored continuously by radiology nursing throughout the procedure under my direct supervision. FLUOROSCOPY TIME:  0 minutes, 12 seconds (1.0 mGy) COMPLICATIONS: None PROCEDURE: The procedure, risks, benefits, and alternatives were explained to the patient. Questions regarding the procedure were encouraged and answered. The patient understands and consents to the procedure. Ultrasound survey was performed with images stored and sent to PACs. The right neck and chest was prepped with chlorhexidine, and draped in the usual sterile fashion using maximum barrier technique (cap and mask, sterile gown, sterile gloves, large sterile sheet, hand hygiene  and cutaneous antiseptic). Antibiotic prophylaxis was provided with 2.0g Ancef administered IV one hour prior to skin incision. Local anesthesia was attained by infiltration with 1% lidocaine without epinephrine. Ultrasound demonstrated patency of the right internal jugular vein, and this was documented with an image. Under real-time ultrasound guidance, this vein was accessed with a 21 gauge micropuncture needle and image documentation was performed. A small dermatotomy was made at the access site with an 11 scalpel. A 0.018" wire was advanced into the SVC and used to estimate the length of the internal catheter. The access needle exchanged for a 60F micropuncture vascular sheath. The 0.018" wire was then removed and a 0.035" wire advanced into the IVC. An appropriate location for the subcutaneous reservoir was selected below the clavicle and an incision was made through the skin and underlying soft tissues. The subcutaneous tissues were then dissected using a combination of blunt and sharp surgical technique and a pocket was formed. A single lumen power injectable portacatheter was then tunneled through the subcutaneous tissues from the pocket to the dermatotomy and the port reservoir placed within the subcutaneous pocket. The venous access site was then serially dilated  and a peel away vascular sheath placed over the wire. The wire was removed and the port catheter advanced into position under fluoroscopic guidance. The catheter tip is positioned in the cavoatrial junction. This was documented with a spot image. The portacatheter was then tested and found to flush and aspirate well. The port was flushed with saline followed by 100 units/mL heparinized saline. The pocket was then closed in two layers using first subdermal inverted interrupted absorbable sutures followed by a running subcuticular suture. The epidermis was then sealed with Dermabond. The dermatotomy at the venous access site was also seal with Dermabond. Patient tolerated the procedure well and remained hemodynamically stable throughout. No complications encountered and no significant blood loss encountered IMPRESSION: Status post right IJ port catheter placement. Signed, Dulcy Fanny. Dellia Nims, RPVI [facility] Radiology Electronically Signed   By: Corrie Mckusick D.O.   On: 11/04/2018 12:32    Assessment & Plan:   There are no diagnoses linked to this encounter.   No orders of the defined types were placed in this encounter.    Follow-up: No follow-ups on file.  Walker Kehr, MD

## 2018-11-12 NOTE — Progress Notes (Signed)
Anti-emetics updated with Emend and dexamethasone 12mg . Dexamethasone 10mg  IV push deleted from plan. Orders per discussion with Dr. Jana Hakim secondary to N/V after cycle 1.  Johny Drilling, PharmD, BCPS, BCOP

## 2018-11-12 NOTE — Assessment & Plan Note (Signed)
S/p port-a-cath placement Dr Jana Hakim

## 2018-11-12 NOTE — Assessment & Plan Note (Signed)
Eliquis 

## 2018-11-12 NOTE — Assessment & Plan Note (Signed)
Wt Readings from Last 3 Encounters:  11/12/18 134 lb (60.8 kg)  11/06/18 136 lb 1.6 oz (61.7 kg)  10/23/18 135 lb (61.2 kg)

## 2018-11-13 ENCOUNTER — Inpatient Hospital Stay: Payer: Medicare Other | Attending: Oncology

## 2018-11-13 ENCOUNTER — Inpatient Hospital Stay: Payer: Medicare Other

## 2018-11-13 ENCOUNTER — Inpatient Hospital Stay (HOSPITAL_BASED_OUTPATIENT_CLINIC_OR_DEPARTMENT_OTHER): Payer: Medicare Other | Admitting: Oncology

## 2018-11-13 ENCOUNTER — Encounter: Payer: Self-pay | Admitting: Nutrition

## 2018-11-13 ENCOUNTER — Inpatient Hospital Stay: Payer: Medicare Other | Admitting: Nutrition

## 2018-11-13 VITALS — BP 141/83 | HR 72 | Temp 98.9°F | Resp 18 | Ht 64.5 in | Wt 134.5 lb

## 2018-11-13 DIAGNOSIS — Z87891 Personal history of nicotine dependence: Secondary | ICD-10-CM | POA: Diagnosis not present

## 2018-11-13 DIAGNOSIS — Z803 Family history of malignant neoplasm of breast: Secondary | ICD-10-CM | POA: Diagnosis not present

## 2018-11-13 DIAGNOSIS — Z7901 Long term (current) use of anticoagulants: Secondary | ICD-10-CM | POA: Insufficient documentation

## 2018-11-13 DIAGNOSIS — R978 Other abnormal tumor markers: Secondary | ICD-10-CM | POA: Insufficient documentation

## 2018-11-13 DIAGNOSIS — Z86711 Personal history of pulmonary embolism: Secondary | ICD-10-CM | POA: Insufficient documentation

## 2018-11-13 DIAGNOSIS — Z5112 Encounter for antineoplastic immunotherapy: Secondary | ICD-10-CM | POA: Diagnosis present

## 2018-11-13 DIAGNOSIS — C787 Secondary malignant neoplasm of liver and intrahepatic bile duct: Secondary | ICD-10-CM

## 2018-11-13 DIAGNOSIS — C799 Secondary malignant neoplasm of unspecified site: Secondary | ICD-10-CM

## 2018-11-13 DIAGNOSIS — Z79899 Other long term (current) drug therapy: Secondary | ICD-10-CM

## 2018-11-13 DIAGNOSIS — C169 Malignant neoplasm of stomach, unspecified: Secondary | ICD-10-CM

## 2018-11-13 DIAGNOSIS — D649 Anemia, unspecified: Secondary | ICD-10-CM

## 2018-11-13 DIAGNOSIS — Z8042 Family history of malignant neoplasm of prostate: Secondary | ICD-10-CM

## 2018-11-13 DIAGNOSIS — Z95828 Presence of other vascular implants and grafts: Secondary | ICD-10-CM

## 2018-11-13 DIAGNOSIS — Z5111 Encounter for antineoplastic chemotherapy: Secondary | ICD-10-CM | POA: Insufficient documentation

## 2018-11-13 DIAGNOSIS — C160 Malignant neoplasm of cardia: Secondary | ICD-10-CM

## 2018-11-13 LAB — CBC WITH DIFFERENTIAL/PLATELET
Abs Immature Granulocytes: 0.05 10*3/uL (ref 0.00–0.07)
Basophils Absolute: 0 10*3/uL (ref 0.0–0.1)
Basophils Relative: 1 %
Eosinophils Absolute: 0.1 10*3/uL (ref 0.0–0.5)
Eosinophils Relative: 1 %
HCT: 28 % — ABNORMAL LOW (ref 39.0–52.0)
Hemoglobin: 8.7 g/dL — ABNORMAL LOW (ref 13.0–17.0)
Immature Granulocytes: 1 %
Lymphocytes Relative: 24 %
Lymphs Abs: 1 10*3/uL (ref 0.7–4.0)
MCH: 30 pg (ref 26.0–34.0)
MCHC: 31.1 g/dL (ref 30.0–36.0)
MCV: 96.6 fL (ref 80.0–100.0)
Monocytes Absolute: 0.7 10*3/uL (ref 0.1–1.0)
Monocytes Relative: 15 %
Neutro Abs: 2.5 10*3/uL (ref 1.7–7.7)
Neutrophils Relative %: 58 %
Platelets: 583 10*3/uL — ABNORMAL HIGH (ref 150–400)
RBC: 2.9 MIL/uL — ABNORMAL LOW (ref 4.22–5.81)
RDW: 19.2 % — ABNORMAL HIGH (ref 11.5–15.5)
WBC: 4.3 10*3/uL (ref 4.0–10.5)
nRBC: 0 % (ref 0.0–0.2)

## 2018-11-13 LAB — COMPREHENSIVE METABOLIC PANEL
ALT: 33 U/L (ref 0–44)
AST: 32 U/L (ref 15–41)
Albumin: 3.4 g/dL — ABNORMAL LOW (ref 3.5–5.0)
Alkaline Phosphatase: 112 U/L (ref 38–126)
Anion gap: 9 (ref 5–15)
BUN: 9 mg/dL (ref 8–23)
CO2: 27 mmol/L (ref 22–32)
Calcium: 8.7 mg/dL — ABNORMAL LOW (ref 8.9–10.3)
Chloride: 102 mmol/L (ref 98–111)
Creatinine, Ser: 0.91 mg/dL (ref 0.61–1.24)
GFR calc Af Amer: >60 mL/min (ref >60)
GFR calc non Af Amer: >60 mL/min (ref >60)
Glucose, Bld: 115 mg/dL — ABNORMAL HIGH (ref 70–99)
Potassium: 3.7 mmol/L (ref 3.5–5.1)
Sodium: 138 mmol/L (ref 135–145)
Total Bilirubin: 0.9 mg/dL (ref 0.3–1.2)
Total Protein: 6.5 g/dL (ref 6.5–8.1)

## 2018-11-13 MED ORDER — SODIUM CHLORIDE 0.9% FLUSH
10.0000 mL | INTRAVENOUS | Status: DC | PRN
  Administered 2018-11-13: 10 mL via INTRAVENOUS
  Filled 2018-11-13: qty 10

## 2018-11-13 MED ORDER — HEPARIN SOD (PORK) LOCK FLUSH 100 UNIT/ML IV SOLN
500.0000 [IU] | Freq: Once | INTRAVENOUS | Status: AC
  Administered 2018-11-13: 500 [IU] via INTRAVENOUS
  Filled 2018-11-13: qty 5

## 2018-11-13 NOTE — Progress Notes (Signed)
Chemotherapy was canceled therefore nutrition follow-up could not be completed.

## 2018-11-14 LAB — CANCER ANTIGEN 19-9: CA 19-9: 7694 U/mL — ABNORMAL HIGH (ref 0–35)

## 2018-11-20 ENCOUNTER — Other Ambulatory Visit: Payer: Medicare Other

## 2018-11-20 ENCOUNTER — Ambulatory Visit: Payer: Medicare Other | Admitting: Adult Health

## 2018-11-20 ENCOUNTER — Other Ambulatory Visit: Payer: Self-pay | Admitting: Internal Medicine

## 2018-11-20 ENCOUNTER — Ambulatory Visit: Payer: Medicare Other

## 2018-11-21 MED FILL — CAPECITABINE 500 MG TABS: 500 | 21 days supply | Qty: 56 | Fill #1

## 2018-11-21 NOTE — Telephone Encounter (Signed)
Pt called to f/u on amlodipine request - pt has 1 wk left

## 2018-11-26 ENCOUNTER — Other Ambulatory Visit: Payer: Self-pay

## 2018-11-26 ENCOUNTER — Inpatient Hospital Stay: Payer: Medicare Other

## 2018-11-26 ENCOUNTER — Encounter: Payer: Self-pay | Admitting: Adult Health

## 2018-11-26 ENCOUNTER — Inpatient Hospital Stay (HOSPITAL_BASED_OUTPATIENT_CLINIC_OR_DEPARTMENT_OTHER): Payer: Medicare Other | Admitting: Adult Health

## 2018-11-26 VITALS — BP 131/70 | HR 69 | Temp 98.7°F | Resp 18 | Ht 64.5 in | Wt 135.1 lb

## 2018-11-26 DIAGNOSIS — C169 Malignant neoplasm of stomach, unspecified: Secondary | ICD-10-CM | POA: Diagnosis not present

## 2018-11-26 DIAGNOSIS — D649 Anemia, unspecified: Secondary | ICD-10-CM

## 2018-11-26 DIAGNOSIS — C168 Malignant neoplasm of overlapping sites of stomach: Secondary | ICD-10-CM

## 2018-11-26 DIAGNOSIS — Z5112 Encounter for antineoplastic immunotherapy: Secondary | ICD-10-CM | POA: Diagnosis not present

## 2018-11-26 DIAGNOSIS — C799 Secondary malignant neoplasm of unspecified site: Principal | ICD-10-CM

## 2018-11-26 DIAGNOSIS — C160 Malignant neoplasm of cardia: Secondary | ICD-10-CM

## 2018-11-26 DIAGNOSIS — Z79899 Other long term (current) drug therapy: Secondary | ICD-10-CM

## 2018-11-26 DIAGNOSIS — Z7901 Long term (current) use of anticoagulants: Secondary | ICD-10-CM

## 2018-11-26 DIAGNOSIS — C787 Secondary malignant neoplasm of liver and intrahepatic bile duct: Secondary | ICD-10-CM

## 2018-11-26 DIAGNOSIS — Z87891 Personal history of nicotine dependence: Secondary | ICD-10-CM | POA: Diagnosis not present

## 2018-11-26 DIAGNOSIS — Z86711 Personal history of pulmonary embolism: Secondary | ICD-10-CM | POA: Diagnosis not present

## 2018-11-26 DIAGNOSIS — Z8042 Family history of malignant neoplasm of prostate: Secondary | ICD-10-CM

## 2018-11-26 DIAGNOSIS — Z803 Family history of malignant neoplasm of breast: Secondary | ICD-10-CM

## 2018-11-26 DIAGNOSIS — Z95828 Presence of other vascular implants and grafts: Secondary | ICD-10-CM

## 2018-11-26 LAB — CBC WITH DIFFERENTIAL/PLATELET
Abs Immature Granulocytes: 0.02 10*3/uL (ref 0.00–0.07)
Basophils Absolute: 0 10*3/uL (ref 0.0–0.1)
Basophils Relative: 1 %
Eosinophils Absolute: 0.1 10*3/uL (ref 0.0–0.5)
Eosinophils Relative: 3 %
HCT: 28.7 % — ABNORMAL LOW (ref 39.0–52.0)
Hemoglobin: 9 g/dL — ABNORMAL LOW (ref 13.0–17.0)
IMMATURE GRANULOCYTES: 1 %
Lymphocytes Relative: 31 %
Lymphs Abs: 1.2 10*3/uL (ref 0.7–4.0)
MCH: 31.1 pg (ref 26.0–34.0)
MCHC: 31.4 g/dL (ref 30.0–36.0)
MCV: 99.3 fL (ref 80.0–100.0)
MONO ABS: 0.5 10*3/uL (ref 0.1–1.0)
Monocytes Relative: 12 %
Neutro Abs: 2 10*3/uL (ref 1.7–7.7)
Neutrophils Relative %: 52 %
Platelets: 215 10*3/uL (ref 150–400)
RBC: 2.89 MIL/uL — ABNORMAL LOW (ref 4.22–5.81)
RDW: 19.7 % — ABNORMAL HIGH (ref 11.5–15.5)
WBC: 3.8 10*3/uL — ABNORMAL LOW (ref 4.0–10.5)
nRBC: 0 % (ref 0.0–0.2)

## 2018-11-26 LAB — COMPREHENSIVE METABOLIC PANEL WITH GFR
ALT: 30 U/L (ref 0–44)
AST: 31 U/L (ref 15–41)
Albumin: 3.3 g/dL — ABNORMAL LOW (ref 3.5–5.0)
Alkaline Phosphatase: 80 U/L (ref 38–126)
Anion gap: 8 (ref 5–15)
BUN: 13 mg/dL (ref 8–23)
CO2: 26 mmol/L (ref 22–32)
Calcium: 8.6 mg/dL — ABNORMAL LOW (ref 8.9–10.3)
Chloride: 105 mmol/L (ref 98–111)
Creatinine, Ser: 0.83 mg/dL (ref 0.61–1.24)
GFR calc Af Amer: >60 mL/min
GFR calc non Af Amer: >60 mL/min
Glucose, Bld: 99 mg/dL (ref 70–99)
Potassium: 4.3 mmol/L (ref 3.5–5.1)
Sodium: 139 mmol/L (ref 135–145)
Total Bilirubin: 0.7 mg/dL (ref 0.3–1.2)
Total Protein: 6.2 g/dL — ABNORMAL LOW (ref 6.5–8.1)

## 2018-11-26 MED ORDER — DIPHENHYDRAMINE HCL 25 MG PO CAPS
ORAL_CAPSULE | ORAL | Status: AC
  Filled 2018-11-26: qty 1

## 2018-11-26 MED ORDER — ACETAMINOPHEN 325 MG PO TABS
650.0000 mg | ORAL_TABLET | Freq: Once | ORAL | Status: AC
  Administered 2018-11-26: 650 mg via ORAL

## 2018-11-26 MED ORDER — DEXTROSE 5 % IV SOLN
Freq: Once | INTRAVENOUS | Status: AC
  Administered 2018-11-26: 15:00:00 via INTRAVENOUS
  Filled 2018-11-26: qty 250

## 2018-11-26 MED ORDER — HEPARIN SOD (PORK) LOCK FLUSH 100 UNIT/ML IV SOLN
500.0000 [IU] | Freq: Once | INTRAVENOUS | Status: AC | PRN
  Administered 2018-11-26: 500 [IU]
  Filled 2018-11-26: qty 5

## 2018-11-26 MED ORDER — SODIUM CHLORIDE 0.9 % IV SOLN
Freq: Once | INTRAVENOUS | Status: AC
  Administered 2018-11-26: 14:00:00 via INTRAVENOUS
  Filled 2018-11-26: qty 5

## 2018-11-26 MED ORDER — OXALIPLATIN CHEMO INJECTION 100 MG/20ML
90.0000 mg/m2 | Freq: Once | INTRAVENOUS | Status: AC
  Administered 2018-11-26: 150 mg via INTRAVENOUS
  Filled 2018-11-26: qty 20

## 2018-11-26 MED ORDER — PALONOSETRON HCL INJECTION 0.25 MG/5ML
0.2500 mg | Freq: Once | INTRAVENOUS | Status: AC
  Administered 2018-11-26: 0.25 mg via INTRAVENOUS

## 2018-11-26 MED ORDER — SODIUM CHLORIDE 0.9 % IV SOLN
Freq: Once | INTRAVENOUS | Status: AC
  Administered 2018-11-26: 13:00:00 via INTRAVENOUS
  Filled 2018-11-26: qty 250

## 2018-11-26 MED ORDER — SODIUM CHLORIDE 0.9% FLUSH
10.0000 mL | INTRAVENOUS | Status: DC | PRN
  Administered 2018-11-26: 10 mL via INTRAVENOUS
  Filled 2018-11-26: qty 10

## 2018-11-26 MED ORDER — PALONOSETRON HCL INJECTION 0.25 MG/5ML
INTRAVENOUS | Status: AC
  Filled 2018-11-26: qty 5

## 2018-11-26 MED ORDER — ACETAMINOPHEN 325 MG PO TABS
ORAL_TABLET | ORAL | Status: AC
  Filled 2018-11-26: qty 2

## 2018-11-26 MED ORDER — DIPHENHYDRAMINE HCL 25 MG PO CAPS
25.0000 mg | ORAL_CAPSULE | Freq: Once | ORAL | Status: AC
  Administered 2018-11-26: 25 mg via ORAL

## 2018-11-26 MED ORDER — SODIUM CHLORIDE 0.9% FLUSH
10.0000 mL | INTRAVENOUS | Status: DC | PRN
  Administered 2018-11-26: 10 mL
  Filled 2018-11-26: qty 10

## 2018-11-26 MED ORDER — TRASTUZUMAB CHEMO 150 MG IV SOLR
6.0000 mg/kg | Freq: Once | INTRAVENOUS | Status: AC
  Administered 2018-11-26: 357 mg via INTRAVENOUS
  Filled 2018-11-26: qty 17

## 2018-11-26 NOTE — Patient Instructions (Signed)
[facility] Discharge Instructions for Patients Receiving Chemotherapy  Today you received the following chemotherapy agents Trastuzumab (Herceptin)  Oxaliplatin  To help prevent nausea and vomiting after your treatment, we encourage you to take your nausea medication as prescribed.   If you develop nausea and vomiting that is not controlled by your nausea medication, call the clinic.   BELOW ARE SYMPTOMS THAT SHOULD BE REPORTED IMMEDIATELY:  *FEVER GREATER THAN 100.5 F  *CHILLS WITH OR WITHOUT FEVER  NAUSEA AND VOMITING THAT IS NOT CONTROLLED WITH YOUR NAUSEA MEDICATION  *UNUSUAL SHORTNESS OF BREATH  *UNUSUAL BRUISING OR BLEEDING  TENDERNESS IN MOUTH AND THROAT WITH OR WITHOUT PRESENCE OF ULCERS  *URINARY PROBLEMS  *BOWEL PROBLEMS  UNUSUAL RASH Items with * indicate a potential emergency and should be followed up as soon as possible.  Feel free to call the clinic should you have any questions or concerns. The clinic phone number is [phone].  Please show the [facility] at check-in to the Emergency Department and triage nurse.

## 2018-11-26 NOTE — Progress Notes (Signed)
[place]  [phone] [phone]     ID: Peter Wood DOB: [birth year]  MR#: [id number]  MCE#:[id number]  Patient Care Team: Cassandria Anger, MD as PCP - General Milus Banister, MD as Attending Physician (Gastroenterology) Magrinat, Virgie Dad, MD as Consulting Physician (Oncology) Gatha Mayer, MD as Consulting Physician (Gastroenterology) [name], MD as Consulting Physician (Ophthalmology) Ramon Dredge, MD as Referring Physician (Radiation Oncology) OTHER MD:   CHIEF COMPLAINT: Metastatic gastric adenocarcinoma  CURRENT TREATMENT: oxaliplatin, capecitabine, trastuzumab   INTERVAL HISTORY: Peter Wood returns today for follow-up and treatment of his metastatic gastric adenocarcinoma. He is accompanied by his wife.  He started oxaliplatin and trastuzumab 11/06/2018. Today is cycle 2 day 1.  He is tolerating it well.    He also continues on capecitabine.  He is receiving this at 1000 mg twice daily for 14 days  Cain's last echocardiogram on 09/20/2018, showed an ejection fraction in the 55% - 60% range.   Since his last visit here, he has not undergone any additional studies.    REVIEW OF SYSTEMS: Jobanny and his wife note that he is slowly improving.  He has no peripheral neuropathy.  His weight is up by 2 pounds since his last visit.  He is eating and drinking better and his appetite has increased.  He is still slightly fatigued.  He denies fever, chills, cough, shortness of breath, chest pain, or palpitations.  He hasn't noted any nausea, vomiting, bowel or bladder changes.  A detailed ROS was otherwise non contributory.     HISTORY OF CURRENT ILLNESS: From the original intake note:  Peter Wood presented to the emergency room 09/07/2018 with mid/upper abdominal pain.  He reported weight loss of 25 pounds over the past 6 months and significant anorexia.  His liver function tests were abnormal and a right upper  quadrant ultrasound was obtained.  This was read as concerning for malignancy and a CT of the abdomen and pelvis with contrast was obtained the same day, showing  1. Too numerous to count hypodense masses of the liver concerning for hepatic metastasis. In the region of the porta hepatis, there are hypodense lesions more likely associated with the liver though the pancreatic head is partially obscured by a 3.2 cm hypodense mass. Findings are more likely associated with the liver and less likely an isolated pancreatic mass. 2. Upper abdominal mesenteric and peripancreatic lymphadenopathy suspicious for metastatic disease. 3. 6 mm sclerotic density in the left iliac bone adjacent to the SI joint. Given history of prostate cancer, differential considerations would include osteoblastic metastasis or possibly bone island. Showed only diverticular disease.  He was referred to GI and Dr. Ardis Hughs notes a colonoscopy performed 04/23/2017 makes the possibility of colon cancer unlikely.  Further work-up was planned, but on 09/19/2018 the patient again presented to the emergency room with chest pain, now in a different area, and a staging CT scan of the chest showed filling defects in the right lower lobe pulmonary arteries consistent with embolism.  There was a small pericardial effusion and an additional 2.4 cm left thyroid mass.  Aortic atherosclerosis was noted  He was started on intravenous heparin and on 09/20/2018 underwent upper endoscopy under Dr. Carlean Purl showing a giant nonbleeding cratered gastric ulcer in the posterior wall of the stomach.  Biopsies of this procedure showed (SZA 20-187) adenocarcinoma.    At that point we were consulted and we obtained tumor markers which included a CEA of  1318.2, a CA 19-9 of 67,479, a normal AFP and a normal PSA.  I discussed the case with pathology and they do not feel that additional testing would be helpful in terms of discriminating between a primary gastric and a  primary biliary/pancreatic tumor.  The patient's subsequent history is as detailed below.   PAST MEDICAL HISTORY: Past Medical History:  Diagnosis Date   Arthritis    Diverticulosis of colon    Family history of breast cancer    Family history of prostate cancer    HTN (hypertension)    Hx of colonic polyp    Pneumonia    as a child/baby   Poor circulation of extremity    right leg   Prostate cancer ([place]) 2015   prostate cancer - radiation treated     PAST SURGICAL HISTORY: Past Surgical History:  Procedure Laterality Date   25 GAUGE PARS PLANA VITRECTOMY WITH 20 GAUGE MVR PORT FOR MACULAR HOLE Right 09/19/2016   Procedure: 25 GAUGE PARS PLANA VITRECTOMY WITH 20 GAUGE MVR PORT FOR MACULAR HOLE, MEMBRANE PEEL, SERUM PATCH, GAS FLUID EXCHANGE, HEADSCOPE LASER;  Surgeon: [name], MD;  Location: [facility];  Service: Ophthalmology;  Laterality: Right;   BIOPSY  09/20/2018   Procedure: BIOPSY;  Surgeon: Gatha Mayer, MD;  Location: [facility];  Service: Endoscopy;;   CATARACT EXTRACTION  08/27/12   Right   COLONOSCOPY     ESOPHAGOGASTRODUODENOSCOPY (EGD) WITH PROPOFOL N/A 09/20/2018   Procedure: ESOPHAGOGASTRODUODENOSCOPY (EGD) WITH PROPOFOL;  Surgeon: Gatha Mayer, MD;  Location: [facility];  Service: Endoscopy;  Laterality: N/A;   HAND SURGERY  2016   right thumb   IR IMAGING GUIDED PORT INSERTION  11/04/2018   KNEE SURGERY Left 1989     FAMILY HISTORY: Family History  Problem Relation Age of Onset   Prostate cancer Father        dx >50   Hypertension Mother    Ulcers Mother    Breast cancer Sister        dx 20's   Prostate cancer Brother        dx under 15, metasttic, cause of his death   Ulcers Brother    Ulcers Sister    Banks's father died from heart complications with a pacemaker at age 53; Merland's father also has prostate cancer. Patients' mother died from natural causes at age 49. The patient has 4 brothers and 3 sisters.  One of Tranquillity sisters had breast cancer in her 66s and a brother had prostate cancer. Patient denies anyone in her family having ovarian, or pancreatic cancer. Nghia mentions that his youngest sister and his brother with prostate cancer also had stomach ulcers.    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 His wife, [name], is a retired [employer] summer Radiographer, therapeutic. As of 09/2018, they have been married for 56 years. They have 4 children, Claiborne Billings, [place], [place], and [place]. Claiborne Billings lives in [place] and works at the [employer]. Sherrod lives in [place] and works at [employer] and as a part Horticulturist, commercial. Germane lives in [place] and is a Engineer, drilling for a medical company. Beverely Low lives in [place] and works at [employer]. Kaire has 7 grandchildren and 1 step great grandchild. He attends [employer].     ADVANCED DIRECTIVES: His wife, [name], is automatically his healthcare power of attorney.     HEALTH MAINTENANCE: Social History   Tobacco Use   Smoking status: Former  Smoker    Years: 4.00    Last attempt to quit: 09/12/1967    Years since quitting: 51.2   Smokeless tobacco: Never Used  Substance Use Topics   Alcohol use: No   Drug use: No    Colonoscopy: 2018/[facility]  PSA: 0.54 on 09/18/2018  No Known Allergies  Current Outpatient Medications  Medication Sig Dispense Refill   amLODipine (NORVASC) 5 MG tablet TAKE ONE TABLET BY MOUTH ONE TIME DAILY  90 tablet 2   apixaban (ELIQUIS) 5 MG TABS tablet Take 1 tablet (5 mg total) by mouth 2 (two) times daily. 60 tablet 11   benazepril (LOTENSIN) 40 MG tablet TAKE ONE TABLET BY MOUTH ONE TIME DAILY  90 tablet 1   capecitabine (XELODA) 500 MG tablet Take 2 tablets (1,000 mg total) by mouth 2 (two) times daily after a meal. Take on days 1-14 of chemotherapy. Repeat every 21 days. 56 tablet 4   Cholecalciferol 1000 UNITS tablet Take 1,000 Units by mouth daily.      latanoprost  (XALATAN) 0.005 % ophthalmic solution Place 1 drop into both eyes at bedtime.      lidocaine-prilocaine (EMLA) cream Apply to affected area once 30 g 3   ondansetron (ZOFRAN) 4 MG tablet Take 1 tablet (4 mg total) by mouth every 8 (eight) hours as needed for nausea or vomiting. 20 tablet 1   pantoprazole (PROTONIX) 40 MG tablet Take 1 tablet (40 mg total) by mouth 2 (two) times daily before a meal. 60 tablet 0   prochlorperazine (COMPAZINE) 10 MG tablet Take 1 tablet (10 mg total) by mouth every 6 (six) hours as needed (Nausea or vomiting). 30 tablet 1   No current facility-administered medications for this visit.    Facility-Administered Medications Ordered in Other Visits  Medication Dose Route Frequency Provider Last Rate Last Dose   dextrose 5 % solution   Intravenous Once Magrinat, Virgie Dad, MD       heparin lock flush 100 unit/mL  500 Units Intracatheter Once PRN Magrinat, Virgie Dad, MD       oxaliplatin (ELOXATIN) 150 mg in dextrose 5 % 500 mL chemo infusion  90 mg/m2 (Treatment Plan Recorded) Intravenous Once Magrinat, Virgie Dad, MD       sodium chloride flush (NS) 0.9 % injection 10 mL  10 mL Intravenous PRN Magrinat, Virgie Dad, MD       sodium chloride flush (NS) 0.9 % injection 10 mL  10 mL Intracatheter PRN Magrinat, Virgie Dad, MD       trastuzumab (HERCEPTIN) 357 mg in sodium chloride 0.9 % 250 mL chemo infusion  6 mg/kg (Treatment Plan Recorded) Intravenous Once Gardenia Phlegm, NP 534 mL/hr at 11/26/18 1441 357 mg at 11/26/18 1441     OBJECTIVE: Vitals:   11/26/18 1215  BP: 131/70  Pulse: 69  Resp: 18  Temp: 98.7 F (37.1 C)  SpO2: 100%     Body mass index is 22.83 kg/m.   Wt Readings from Last 3 Encounters:  11/26/18 135 lb 1.6 oz (61.3 kg)  11/13/18 134 lb 8 oz (61 kg)  11/12/18 134 lb (60.8 kg)  ECOG FS:2 - Symptomatic, <50% confined to bed GENERAL: Patient is an older man in no acute distress HEENT:  Sclerae anicteric.  Oropharynx clear and moist.  No ulcerations or evidence of oropharyngeal candidiasis. Neck is supple.  NODES:  No cervical, supraclavicular, or axillary lymphadenopathy palpated.  LUNGS:  Clear to auscultation bilaterally.  No wheezes or rhonchi. HEART:  Regular rate and rhythm. No murmur appreciated. ABDOMEN:  Soft, nontender.  Positive, normoactive bowel sounds. No organomegaly palpated. MSK:  No focal spinal tenderness to palpation.  EXTREMITIES:  No peripheral edema.   SKIN:  Clear with no obvious rashes or skin changes. No nail dyscrasia. NEURO:  Nonfocal. Well oriented.  Appropriate affect.    LAB RESULTS:  CMP     Component Value Date/Time   NA 139 11/26/2018 1123   K 4.3 11/26/2018 1123   CL 105 11/26/2018 1123   CO2 26 11/26/2018 1123   GLUCOSE 99 11/26/2018 1123   GLUCOSE 105 (H) 09/13/2006 0900   BUN 13 11/26/2018 1123   CREATININE 0.83 11/26/2018 1123   CREATININE 1.02 10/16/2018 0936   CALCIUM 8.6 (L) 11/26/2018 1123   PROT 6.2 (L) 11/26/2018 1123   ALBUMIN 3.3 (L) 11/26/2018 1123   AST 31 11/26/2018 1123   AST 76 (H) 10/16/2018 0936   ALT 30 11/26/2018 1123   ALT 47 (H) 10/16/2018 0936   ALKPHOS 80 11/26/2018 1123   BILITOT 0.7 11/26/2018 1123   BILITOT 0.7 10/16/2018 0936   GFRNONAA >60 11/26/2018 1123   GFRNONAA >60 10/16/2018 0936   GFRAA >60 11/26/2018 1123   GFRAA >60 10/16/2018 0936    No results found for: TOTALPROTELP, ALBUMINELP, A1GS, A2GS, BETS, BETA2SER, GAMS, MSPIKE, SPEI  No results found for: KPAFRELGTCHN, LAMBDASER, KAPLAMBRATIO  Lab Results  Component Value Date   WBC 3.8 (L) 11/26/2018   NEUTROABS 2.0 11/26/2018   HGB 9.0 (L) 11/26/2018   HCT 28.7 (L) 11/26/2018   MCV 99.3 11/26/2018   PLT 215 11/26/2018    '@LASTCHEMISTRY'$ @  No results found for: LABCA2  No components found for: EYCXKG818  No results for input(s): INR in the last 168 hours.  No results found for: LABCA2  Lab Results  Component Value Date   HUD149 7,694 (H) 11/13/2018    No  results found for: FWY637  No results found for: CHY850  No results found for: CA2729  No components found for: HGQUANT  Lab Results  Component Value Date   CEA1 1,237.70 (H) 11/06/2018   /  CEA ([facility])  Date Value Ref Range Status  11/06/2018 1,237.70 (H) 0.00 - 5.00 ng/mL Final    Comment:    (NOTE) This test was performed using Architect's Chemiluminescent Microparticle Immunoassay. Values obtained from different assay methods cannot be used interchangeably. Please note that 5-10% of patients who smoke may see CEA levels up to 6.9 ng/mL. Performed at [facility], [place] [address] [place], [place] [address]      No results found for: AFPTUMOR  No results found for: CHROMOGRNA  No results found for: PSA1  Appointment on 11/26/2018  Component Date Value Ref Range Status   Sodium 11/26/2018 139  135 - 145 mmol/L Final   Potassium 11/26/2018 4.3  3.5 - 5.1 mmol/L Final   Chloride 11/26/2018 105  98 - 111 mmol/L Final   CO2 11/26/2018 26  22 - 32 mmol/L Final   Glucose, Bld 11/26/2018 99  70 - 99 mg/dL Final   BUN 11/26/2018 13  8 - 23 mg/dL Final   Creatinine, Ser 11/26/2018 0.83  0.61 - 1.24 mg/dL Final   Calcium 11/26/2018 8.6* 8.9 - 10.3 mg/dL Final   Total Protein 11/26/2018 6.2* 6.5 - 8.1 g/dL Final   Albumin 11/26/2018 3.3* 3.5 - 5.0 g/dL Final   AST 11/26/2018 31  15 - 41 U/L Final   ALT  11/26/2018 30  0 - 44 U/L Final   Alkaline Phosphatase 11/26/2018 80  38 - 126 U/L Final   Total Bilirubin 11/26/2018 0.7  0.3 - 1.2 mg/dL Final   GFR calc non Af Amer 11/26/2018 >60  >60 mL/min Final   GFR calc Af Amer 11/26/2018 >60  >60 mL/min Final   Anion gap 11/26/2018 8  5 - 15 Final   Performed at [facility], [place] [address] [place], [place] [address]   WBC 11/26/2018 3.8* 4.0 - 10.5 K/uL Final   RBC 11/26/2018 2.89* 4.22 - 5.81 MIL/uL Final   Hemoglobin 11/26/2018 9.0* 13.0 - 17.0 g/dL  Final   HCT 11/26/2018 28.7* 39.0 - 52.0 % Final   MCV 11/26/2018 99.3  80.0 - 100.0 fL Final   MCH 11/26/2018 31.1  26.0 - 34.0 pg Final   MCHC 11/26/2018 31.4  30.0 - 36.0 g/dL Final   RDW 11/26/2018 19.7* 11.5 - 15.5 % Final   Platelets 11/26/2018 215  150 - 400 K/uL Final   nRBC 11/26/2018 0.0  0.0 - 0.2 % Final   Neutrophils Relative % 11/26/2018 52  % Final   Neutro Abs 11/26/2018 2.0  1.7 - 7.7 K/uL Final   Lymphocytes Relative 11/26/2018 31  % Final   Lymphs Abs 11/26/2018 1.2  0.7 - 4.0 K/uL Final   Monocytes Relative 11/26/2018 12  % Final   Monocytes Absolute 11/26/2018 0.5  0.1 - 1.0 K/uL Final   Eosinophils Relative 11/26/2018 3  % Final   Eosinophils Absolute 11/26/2018 0.1  0.0 - 0.5 K/uL Final   Basophils Relative 11/26/2018 1  % Final   Basophils Absolute 11/26/2018 0.0  0.0 - 0.1 K/uL Final   Immature Granulocytes 11/26/2018 1  % Final   Abs Immature Granulocytes 11/26/2018 0.02  0.00 - 0.07 K/uL Final   Performed at [facility], [place] [address] [place], [place] [address]    (this displays the last labs from the last 3 days)  No results found for: TOTALPROTELP, ALBUMINELP, A1GS, A2GS, BETS, BETA2SER, GAMS, MSPIKE, SPEI (this displays SPEP labs)  No results found for: KPAFRELGTCHN, LAMBDASER, KAPLAMBRATIO (kappa/lambda light chains)  No results found for: HGBA, HGBA2QUANT, HGBFQUANT, HGBSQUAN (Hemoglobinopathy evaluation)   No results found for: LDH  Lab Results  Component Value Date   IRON 38 (L) 09/21/2018   TIBC 251 09/21/2018   IRONPCTSAT 15 (L) 09/21/2018   (Iron and TIBC)  Lab Results  Component Value Date   FERRITIN 138 09/21/2018    Urinalysis    Component Value Date/Time   COLORURINE YELLOW 09/22/2018 [place] 09/22/2018 0541   LABSPEC 1.014 09/22/2018 0541   PHURINE 6.0 09/22/2018 0541   GLUCOSEU NEGATIVE 09/22/2018 0541   GLUCOSEU NEGATIVE 07/20/2017 1056   HGBUR NEGATIVE  09/22/2018 0541   BILIRUBINUR NEGATIVE 09/22/2018 0541   KETONESUR 5 (A) 09/22/2018 0541   PROTEINUR NEGATIVE 09/22/2018 0541   UROBILINOGEN 0.2 07/20/2017 1056   NITRITE NEGATIVE 09/22/2018 0541   LEUKOCYTESUR NEGATIVE 09/22/2018 0541     STUDIES:  Ir Imaging Guided Port Insertion  Result Date: 11/04/2018 INDICATION: 84 year old male with a history of metastatic pancreatic carcinoma EXAM: IMPLANTED PORT A CATH PLACEMENT WITH ULTRASOUND AND FLUOROSCOPIC GUIDANCE MEDICATIONS: 2.0 g Ancef; The antibiotic was administered within an appropriate time interval prior to skin puncture. ANESTHESIA/SEDATION: Moderate (conscious) sedation was employed during this procedure. A total of Versed 1.5 mg and Fentanyl 25 mcg was administered intravenously.  Moderate Sedation Time: 19 minutes. The patient's level of consciousness and vital signs were monitored continuously by radiology nursing throughout the procedure under my direct supervision. FLUOROSCOPY TIME:  0 minutes, 12 seconds (1.0 mGy) COMPLICATIONS: None PROCEDURE: The procedure, risks, benefits, and alternatives were explained to the patient. Questions regarding the procedure were encouraged and answered. The patient understands and consents to the procedure. Ultrasound survey was performed with images stored and sent to PACs. The right neck and chest was prepped with chlorhexidine, and draped in the usual sterile fashion using maximum barrier technique (cap and mask, sterile gown, sterile gloves, large sterile sheet, hand hygiene and cutaneous antiseptic). Antibiotic prophylaxis was provided with 2.0g Ancef administered IV one hour prior to skin incision. Local anesthesia was attained by infiltration with 1% lidocaine without epinephrine. Ultrasound demonstrated patency of the right internal jugular vein, and this was documented with an image. Under real-time ultrasound guidance, this vein was accessed with a 21 gauge micropuncture needle and image  documentation was performed. A small dermatotomy was made at the access site with an 11 scalpel. A 0.018" wire was advanced into the SVC and used to estimate the length of the internal catheter. The access needle exchanged for a 20F micropuncture vascular sheath. The 0.018" wire was then removed and a 0.035" wire advanced into the IVC. An appropriate location for the subcutaneous reservoir was selected below the clavicle and an incision was made through the skin and underlying soft tissues. The subcutaneous tissues were then dissected using a combination of blunt and sharp surgical technique and a pocket was formed. A single lumen power injectable portacatheter was then tunneled through the subcutaneous tissues from the pocket to the dermatotomy and the port reservoir placed within the subcutaneous pocket. The venous access site was then serially dilated and a peel away vascular sheath placed over the wire. The wire was removed and the port catheter advanced into position under fluoroscopic guidance. The catheter tip is positioned in the cavoatrial junction. This was documented with a spot image. The portacatheter was then tested and found to flush and aspirate well. The port was flushed with saline followed by 100 units/mL heparinized saline. The pocket was then closed in two layers using first subdermal inverted interrupted absorbable sutures followed by a running subcuticular suture. The epidermis was then sealed with Dermabond. The dermatotomy at the venous access site was also seal with Dermabond. Patient tolerated the procedure well and remained hemodynamically stable throughout. No complications encountered and no significant blood loss encountered IMPRESSION: Status post right IJ port catheter placement. Signed, Dulcy Fanny. Dellia Nims, RPVI [facility] Radiology Electronically Signed   By: Corrie Mckusick D.O.   On: 11/04/2018 12:32     ELIGIBLE FOR AVAILABLE  RESEARCH PROTOCOL: no   ASSESSMENT: 84 y.o. [place], [place] man status post gastric biopsy 09/20/2018 showing adenocarcinoma, with a CA 19-9 greater than 65,000; staging studies showing an apparent mass adjacent to the head of the pancreas, innumerable liver lesions, upper abdominal mesenteric and peripancreatic lymphadenopathy, and an isolated sclerotic density in the left iliac bone  (a) genomics requested on gastric biopsy showed the tumor to be HER-2 amplified at 3+, PD-L1 positive, and T p53 mutated.  MSI is stable, mismatch repair status is proficient and the tumor mutational burden is intermediate.  (1) chest CT scan 09/19/2018 shows right lower lobe pulmonary embolism  (a) on intravenous heparin started 09/19/2018, transitioned to apixaban 10/01/2018  (2) genetics testing 10/04/2018: negative  (a)  family history of prostate and early breast cancer; patient has a history of prostate cancer  (3) started gemcitabine/Abraxane 10/09/2018, repeated days 1, 8 and 15 of each 28-day cycle  (a) stopped after 1 cycle (3 doses) with reassessment of diagnosis based on CARIS results, noted above  (4) to start oxaliplatin, capecitabine and trastuzumab 11/06/2018  (a) baseline echocardiogram 09/20/2018 shows an ejection fraction in the 55-60% range  (b) oxaliplatin and trastuzumab will be given every 21 days  (c) capecitabine initial dose will be 1000 mg twice daily for 14 days of every 21-day cycle   PLAN: Million is doing well today.  His labs are stable and he is tolerating his treatment well.  I congratulated him on his 2 pound weight gain.  He is very happy with this as well.  We reviewed his labs which were stable.  He will proceed with treatment today with Oxaliplatin and Trastuzumab.  He will also start on Capecitabine '1000mg'$  BID for 14 days.    Terrall's activity level is increasing and he is regaining his strength, which is good news.  His tumor markers are declining, which we also reviewed.      Demetry will return in 3 weeks for labs, f/u with [place], and his third cycle of Oxaliplatin, Trastuzumab and to start capectiabne.  He knows to call for any questions or concerns that may arise between now and then.    A total of (20) minutes of face-to-face time was spent with this patient with greater than 50% of that time in counseling and care-coordination.     Wilber Bihari, NP 11/26/18 2:47 PM Medical Oncology and Hematology [facility] [address] [place], [place] [address] Tel. [phone]    Fax. [phone]

## 2018-12-04 ENCOUNTER — Other Ambulatory Visit: Payer: Medicare Other

## 2018-12-04 ENCOUNTER — Ambulatory Visit: Payer: Medicare Other | Admitting: Adult Health

## 2018-12-04 ENCOUNTER — Ambulatory Visit: Payer: Medicare Other

## 2018-12-10 ENCOUNTER — Other Ambulatory Visit: Payer: Self-pay | Admitting: Internal Medicine

## 2018-12-10 MED FILL — CAPECITABINE 500 MG TABS: 500 | 21 days supply | Qty: 56 | Fill #2

## 2018-12-17 NOTE — Progress Notes (Signed)
[place]  [phone] Fax:([phone]     ID: LERON STOFFERS DOB: 11-Apr-1935  MR#: [id number]  YQI#:[id number]  Patient Care Team: Peter Anger, MD as PCP - General Peter Banister, MD as Attending Physician (Gastroenterology) Wood, Peter Dad, MD as Consulting Physician (Oncology) Peter Mayer, MD as Consulting Physician (Gastroenterology) Peter Pedro, MD as Consulting Physician (Ophthalmology) Peter Dredge, MD as Referring Physician (Radiation Oncology) OTHER MD:   CHIEF COMPLAINT: Metastatic gastric adenocarcinoma  CURRENT TREATMENT: oxaliplatin, capecitabine, trastuzumab   INTERVAL HISTORY: Peter Wood returns today for follow-up and treatment of his metastatic gastric adenocarcinoma. His wife was able to participate via speaker phone.  He started oxaliplatin and trastuzumab 11/06/2018, given together with capecitabine (CAPOX).  He received a second cycle 11/26/2018. He is here today for cycle number 3. He denies any issues with his port, stating it's working well.  His counts have held up remarkably well and we have not had to use growth factors or dose reductions.  More specifically he receives capecitabine at 1000 mg by mouth twice a day for 14 days beginning on day 1 of each cycle.  He tolerates this well. He reports some skin darkening but experiences no other side effects.  He has not developed neuropathy from the oxaliplatin and is aware of the need to avoid cold.  Of course it is now a bit warmer so that should be easier.  He receives trastuzumab together with the oxaliplatin.  He has had no side effects regarding that.  [name]'s last echocardiogram on 09/20/2018, showed an ejection fraction in the 55% - 60% range.   Since his last visit here, he has not undergone any additional studies.  However there has been a significant drop in his tumor marker, by a log.   Lab Results  Component Value Date   Peter Wood  3,329 (H) 11/13/2018   Peter Wood 75,452 (H) 10/23/2018     REVIEW OF SYSTEMS: Peter Wood reports doing very well overall. He reports he is eating well, and a 2 lb weight gain was noted today. He denies nausea, issues with bowels, issues with taste, and any pain. He notes a little swelling in both ankles that goes up and down irregularly, but is bilaterally. The patient denies unusual headaches, visual changes, vomiting, stiff neck, dizziness, or gait imbalance. There has been no cough, phlegm production, or pleurisy, no chest pain or pressure, and no change in bladder habits. The patient denies fever, rash, unexplained fatigue or unexplained weight loss.  He has had no bleeding or bruising complications from the apixaban.  A detailed review of systems was otherwise entirely negative.   HISTORY OF CURRENT ILLNESS: From the original intake note:  Peter Wood presented to the emergency room 09/07/2018 with mid/upper abdominal pain.  He reported weight loss of 25 pounds over the past 6 months and significant anorexia.  His liver function tests were abnormal and a right upper quadrant ultrasound was obtained.  This was read as concerning for malignancy and a CT of the abdomen and pelvis with contrast was obtained the same day, showing  1. Too numerous to count hypodense masses of the liver concerning for hepatic metastasis. In the region of the porta hepatis, there are hypodense lesions more likely associated with the liver though the pancreatic head is partially obscured by a 3.2 cm hypodense mass. Findings are more likely associated with the liver and less likely an isolated pancreatic mass. 2. Upper abdominal mesenteric and peripancreatic  lymphadenopathy suspicious for metastatic disease. 3. 6 mm sclerotic density in the left iliac bone adjacent to the SI joint. Given history of prostate cancer, differential considerations would include osteoblastic metastasis or possibly bone island. Showed only  diverticular disease.  He was referred to GI and Peter Wood notes a colonoscopy performed 04/23/2017 makes the possibility of colon cancer unlikely.  Further work-up was planned, but on 09/19/2018 the patient again presented to the emergency room with chest pain, now in a different area, and a staging CT scan of the chest showed filling defects in the right lower lobe pulmonary arteries consistent with embolism.  There was a small pericardial effusion and an additional 2.4 cm left thyroid mass.  Aortic atherosclerosis was noted  He was started on intravenous heparin and on 09/20/2018 underwent upper endoscopy under Dr. Carlean Wood showing a giant nonbleeding cratered gastric ulcer in the posterior wall of the stomach.  Biopsies of this procedure showed (Peter Wood) adenocarcinoma.    At that point we were consulted and we obtained tumor markers which included a CEA of 1318.2, a CA 19-9 of 67,479, a normal AFP and a normal PSA.  I discussed the case with pathology and they do not feel that additional testing would be helpful in terms of discriminating between a primary gastric and a primary biliary/pancreatic tumor.  The patient's subsequent history is as detailed below.   PAST MEDICAL HISTORY: Past Medical History:  Diagnosis Date  . Arthritis   . Diverticulosis of colon   . Family history of breast cancer   . Family history of prostate cancer   . HTN (hypertension)   . Hx of colonic polyp   . Pneumonia    as a child/baby  . Poor circulation of extremity    right leg  . Prostate cancer (Peter Wood) 2015   prostate cancer - radiation treated     PAST SURGICAL HISTORY: Past Surgical History:  Procedure Laterality Date  . [place] VITRECTOMY WITH 20 GAUGE MVR PORT FOR MACULAR HOLE Right 09/19/2016   Procedure: 25 GAUGE PARS PLANA VITRECTOMY WITH 20 GAUGE MVR PORT FOR MACULAR HOLE, MEMBRANE PEEL, SERUM PATCH, GAS FLUID EXCHANGE, HEADSCOPE LASER;  Surgeon: Peter Pedro, MD;  Location:  [facility];  Service: Ophthalmology;  Laterality: Right;  . BIOPSY  09/20/2018   Procedure: BIOPSY;  Surgeon: Peter Mayer, MD;  Location: [facility];  Service: Endoscopy;;  . CATARACT EXTRACTION  08/27/12   Right  . COLONOSCOPY    . ESOPHAGOGASTRODUODENOSCOPY (EGD) WITH PROPOFOL N/A 09/20/2018   Procedure: ESOPHAGOGASTRODUODENOSCOPY (EGD) WITH PROPOFOL;  Surgeon: Peter Mayer, MD;  Location: [facility];  Service: Endoscopy;  Laterality: N/A;  . HAND SURGERY  2016   right thumb  . IR IMAGING GUIDED PORT INSERTION  11/04/2018  . KNEE SURGERY Left 1989     FAMILY HISTORY: Family History  Problem Relation Age of Onset  . Prostate cancer Father        dx >50  . Hypertension Mother   . Ulcers Mother   . Breast cancer Sister        dx 20's  . Prostate cancer Brother        dx under 55, metasttic, cause of his death  . Ulcers Brother   . Ulcers Sister    Peyten's father died from heart complications with a pacemaker at age 58; Godson's father also has prostate cancer. Patients' mother died from natural causes at age 36. The patient has 4 brothers  and 3 sisters. One of Ferguson sisters had breast cancer in her 33s and a brother had prostate cancer. Patient denies anyone in her family having ovarian, or pancreatic cancer. Peter Wood mentions that his youngest sister and his brother with prostate cancer also had stomach ulcers.    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 His wife, [name], is a retired [employer] summer Radiographer, therapeutic. As of 09/2018, they have been married for 56 years. They have 4 children, Claiborne Billings, [place], [place], and [place]. Claiborne Billings lives in [place] and works at the [employer]. Sherrod lives in [place] and works at [employer] and as a part Horticulturist, commercial. Germane lives in [place] and is a Engineer, drilling for a medical company. Beverely Low lives in [place] and works at [employer]. Karina has 7 grandchildren and 1 step great grandchild. He attends [employer].     ADVANCED DIRECTIVES: His wife, [name], is automatically his healthcare power of attorney.     HEALTH MAINTENANCE: Social History   Tobacco Use  . Smoking status: Former Smoker    Years: 4.00    Last attempt to quit: 09/12/1967    Years since quitting: 51.3  . Smokeless tobacco: Never Used  Substance Use Topics  . Alcohol use: No  . Drug use: No    Colonoscopy: 2018/[facility]  PSA: 0.54 on 09/18/2018  No Known Allergies  Current Outpatient Medications  Medication Sig Dispense Refill  . amLODipine (NORVASC) 5 MG tablet TAKE ONE TABLET BY MOUTH ONE TIME DAILY  90 tablet 2  . apixaban (ELIQUIS) 5 MG TABS tablet Take 1 tablet (5 mg total) by mouth 2 (two) times daily. 60 tablet 11  . benazepril (LOTENSIN) 40 MG tablet TAKE ONE TABLET BY MOUTH ONE TIME DAILY  90 tablet 2  . capecitabine (XELODA) 500 MG tablet Take 2 tablets (1,000 mg total) by mouth 2 (two) times daily after a meal. Take on days 1-14 of chemotherapy. Repeat every 21 days. 56 tablet 4  . Cholecalciferol 1000 UNITS tablet Take 1,000 Units by mouth daily.     Marland Kitchen latanoprost (XALATAN) 0.005 % ophthalmic solution Place 1 drop into both eyes at bedtime.     . lidocaine-prilocaine (EMLA) cream Apply to affected area once 30 g 3  . ondansetron (ZOFRAN) 4 MG tablet Take 1 tablet (4 mg total) by mouth every 8 (eight) hours as needed for nausea or vomiting. 20 tablet 1  . pantoprazole (PROTONIX) 40 MG tablet Take 1 tablet (40 mg total) by mouth 2 (two) times daily before a meal. 60 tablet 0  . prochlorperazine (COMPAZINE) 10 MG tablet Take 1 tablet (10 mg total) by mouth every 6 (six) hours as needed (Nausea or vomiting). 30 tablet 1   No current facility-administered medications for this visit.    Facility-Administered Medications Ordered in Other Visits  Medication Dose Route Frequency Provider Last Rate Last Dose  . sodium chloride flush (NS) 0.9 % injection 10 mL  10 mL Intravenous PRN Wood, Peter Dad,  MD         OBJECTIVE: Peter Wood in no acute distress Vitals:   12/18/18 1029  BP: 125/70  Pulse: 77  Resp: 18  Temp: 99.7 F (37.6 C)  SpO2: 100%     Body mass index is 23.17 kg/m.   Wt Readings from Last 3 Encounters:  12/18/18 137 lb 1.6 oz (62.2 kg)  11/26/18 135 lb 1.6 oz (61.3 kg)  11/13/18 134 lb 8 oz (61  kg)  ECOG FS:1 - Symptomatic but completely ambulatory  Sclerae unicteric, pupils round and equal Wearing a mask No cervical or supraclavicular adenopathy Lungs no rales or rhonchi Heart regular rate and rhythm Abd soft, nontender, positive bowel sounds MSK no focal spinal tenderness, minimal bilateral ankle edema Neuro: nonfocal, well oriented, appropriate affect   LAB RESULTS:  CMP     Component Value Date/Time   NA 139 11/26/2018 1123   K 4.3 11/26/2018 1123   CL 105 11/26/2018 1123   CO2 26 11/26/2018 1123   GLUCOSE 99 11/26/2018 1123   GLUCOSE 105 (H) 09/13/2006 0900   BUN 13 11/26/2018 1123   CREATININE 0.83 11/26/2018 1123   CREATININE 1.02 10/16/2018 0936   CALCIUM 8.6 (L) 11/26/2018 1123   PROT 6.2 (L) 11/26/2018 1123   ALBUMIN 3.3 (L) 11/26/2018 1123   AST 31 11/26/2018 1123   AST 76 (H) 10/16/2018 0936   ALT 30 11/26/2018 1123   ALT 47 (H) 10/16/2018 0936   ALKPHOS 80 11/26/2018 1123   BILITOT 0.7 11/26/2018 1123   BILITOT 0.7 10/16/2018 0936   GFRNONAA >60 11/26/2018 1123   GFRNONAA >60 10/16/2018 0936   GFRAA >60 11/26/2018 1123   GFRAA >60 10/16/2018 0936    No results found for: TOTALPROTELP, ALBUMINELP, A1GS, A2GS, BETS, BETA2SER, GAMS, MSPIKE, SPEI  No results found for: KPAFRELGTCHN, LAMBDASER, KAPLAMBRATIO  Lab Results  Component Value Date   WBC 3.1 (L) 12/18/2018   NEUTROABS 1.5 (L) 12/18/2018   HGB 9.8 (L) 12/18/2018   HCT 30.7 (L) 12/18/2018   MCV 100.3 (H) 12/18/2018   PLT 211 12/18/2018    _0 @  No results found for: LABCA2  No components found for: ERXVQM086  No results for  input(s): INR in the last 168 hours.  No results found for: LABCA2  Lab Results  Component Value Date   PYP950 7,694 (H) 11/13/2018    No results found for: DTO671  No results found for: IWP809  No results found for: CA2729  No components found for: HGQUANT  Lab Results  Component Value Date   CEA1 1,237.70 (H) 11/06/2018   /  CEA ([facility])  Date Value Ref Range Status  11/06/2018 1,237.70 (H) 0.00 - 5.00 ng/mL Final    Comment:    (NOTE) This test was performed using Architect's Chemiluminescent Microparticle Immunoassay. Values obtained from different assay methods cannot be used interchangeably. Please note that 5-10% of patients who smoke may see CEA levels up to 6.9 ng/mL. Performed at [facility], [place] [address] [place], [place] [address]      No results found for: AFPTUMOR  No results found for: [place]  No results found for: PSA1  Appointment on 12/18/2018  Component Date Value Ref Range Status  . WBC 12/18/2018 3.1* 4.0 - 10.5 K/uL Final  . RBC 12/18/2018 3.06* 4.22 - 5.81 MIL/uL Final  . Hemoglobin 12/18/2018 9.8* 13.0 - 17.0 g/dL Final  . HCT 12/18/2018 30.7* 39.0 - 52.0 % Final  . MCV 12/18/2018 100.3* 80.0 - 100.0 fL Final  . MCH 12/18/2018 32.0  26.0 - 34.0 pg Final  . MCHC 12/18/2018 31.9  30.0 - 36.0 g/dL Final  . RDW 12/18/2018 19.5* 11.5 - 15.5 % Final  . Platelets 12/18/2018 211  150 - 400 K/uL Final  . nRBC 12/18/2018 0.0  0.0 - 0.2 % Final  . Neutrophils Relative % 12/18/2018 48  % Final  . Neutro Abs 12/18/2018 1.5* 1.7 - 7.7 K/uL  Final  . Lymphocytes Relative 12/18/2018 36  % Final  . Lymphs Abs 12/18/2018 1.1  0.7 - 4.0 K/uL Final  . Monocytes Relative 12/18/2018 12  % Final  . Monocytes Absolute 12/18/2018 0.4  0.1 - 1.0 K/uL Final  . Eosinophils Relative 12/18/2018 3  % Final  . Eosinophils Absolute 12/18/2018 0.1  0.0 - 0.5 K/uL Final  . Basophils Relative 12/18/2018 1  % Final  . Basophils  Absolute 12/18/2018 0.0  0.0 - 0.1 K/uL Final  . Immature Granulocytes 12/18/2018 0  % Final  . Abs Immature Granulocytes 12/18/2018 0.01  0.00 - 0.07 K/uL Final   Performed at [facility], [place] [address], [place], [place] [address]    (this displays the last labs from the last 3 days)  No results found for: TOTALPROTELP, ALBUMINELP, A1GS, A2GS, BETS, BETA2SER, GAMS, MSPIKE, SPEI (this displays SPEP labs)  No results found for: KPAFRELGTCHN, LAMBDASER, KAPLAMBRATIO (kappa/lambda light chains)  No results found for: HGBA, HGBA2QUANT, HGBFQUANT, HGBSQUAN (Hemoglobinopathy evaluation)   No results found for: LDH  Lab Results  Component Value Date   IRON 38 (L) 09/21/2018   TIBC 251 09/21/2018   IRONPCTSAT 15 (L) 09/21/2018   (Iron and TIBC)  Lab Results  Component Value Date   FERRITIN 138 09/21/2018    Urinalysis    Component Value Date/Time   COLORURINE YELLOW 09/22/2018 [place] 09/22/2018 0541   LABSPEC 1.014 09/22/2018 [place] 6.0 09/22/2018 0541   GLUCOSEU NEGATIVE 09/22/2018 0541   GLUCOSEU NEGATIVE 07/20/2017 [place] 09/22/2018 0541   BILIRUBINUR NEGATIVE 09/22/2018 0541   KETONESUR 5 (A) 09/22/2018 0541   PROTEINUR NEGATIVE 09/22/2018 0541   UROBILINOGEN 0.2 07/20/2017 1056   NITRITE NEGATIVE 09/22/2018 0541   LEUKOCYTESUR NEGATIVE 09/22/2018 0541     STUDIES:  No results found.   ELIGIBLE FOR AVAILABLE RESEARCH PROTOCOL: no   ASSESSMENT: 84 y.o. [place], [place] Wood status post gastric biopsy 09/20/2018 showing adenocarcinoma, with a CA 19-9 greater than 65,000; staging studies showing an apparent mass adjacent to the head of the pancreas, innumerable liver lesions, upper abdominal mesenteric and peripancreatic lymphadenopathy, and an isolated sclerotic density in the left iliac bone  (a) genomics requested on gastric biopsy showed the tumor to be HER-2 amplified at 3+, PD-L1 positive, and T  p53 mutated.  MSI is stable, mismatch repair status is proficient and the tumor mutational burden is intermediate.  (1) chest CT scan 09/19/2018 shows right lower lobe pulmonary embolism  (a) on intravenous heparin started 09/19/2018, transitioned to apixaban 10/01/2018  (2) genetics testing 10/04/2018: negative  (a) family history of prostate and early breast cancer; patient has a history of prostate cancer  (3) started gemcitabine/Abraxane 10/09/2018, repeated days 1, 8 and 15 of each 28-day cycle  (a) stopped after 1 cycle (3 doses) with reassessment of diagnosis based on CARIS results, noted above  (4) to start oxaliplatin, capecitabine and trastuzumab 11/06/2018  (a) baseline echocardiogram 09/20/2018 shows an ejection fraction in the 55-60% range  (b) oxaliplatin and trastuzumab will be given every 21 days  (c) capecitabine initial dose will be 1000 mg twice daily for 14 days of every 21-day cycle   PLAN: Adil is tolerating his chemotherapy remarkably well.  Currently he has no symptoms related to his cancer or its treatment.  He does have a [place] hyperpigmentation and he understands this will continue.  It does not affect his daily function  We  are accordingly proceeding with CAPOX day.  He already started his capecitabine tablets and he receives this by mail v every 3 weeks.  He is very aware of what he is taking, what it is for, and when he takes it.  We reviewed the drop in his CA 13 which is remarkable and very encouraging.  We did our best to face time with this wife but we were unable to.  We did discuss his situation by phone so she would be able to participate in his care.  They are taking appropriate pandemic precautions  He will return on 01/07/2019 for his next cycle.  They know to call for any other issue that may develop before the next visit.  Peter Dad. Magrinat, MD 12/18/18 10:45 AM Medical Oncology and Hematology [facility] [place],  [address] Tel. [phone]    Fax. [phone]    I, Wilburn Mylar, am acting as scribe for Dr. Virgie Dad. Wood.  I, Lurline Del MD, have reviewed the above documentation for accuracy and completeness, and I agree with the above.

## 2018-12-18 ENCOUNTER — Inpatient Hospital Stay: Payer: Medicare Other

## 2018-12-18 ENCOUNTER — Inpatient Hospital Stay: Payer: Medicare Other | Attending: Oncology | Admitting: Oncology

## 2018-12-18 ENCOUNTER — Inpatient Hospital Stay: Payer: Medicare Other | Admitting: Nutrition

## 2018-12-18 ENCOUNTER — Other Ambulatory Visit: Payer: Self-pay

## 2018-12-18 ENCOUNTER — Telehealth: Payer: Self-pay | Admitting: Nutrition

## 2018-12-18 VITALS — BP 125/70 | HR 77 | Temp 99.7°F | Resp 18 | Ht 64.5 in | Wt 137.1 lb

## 2018-12-18 DIAGNOSIS — R978 Other abnormal tumor markers: Secondary | ICD-10-CM

## 2018-12-18 DIAGNOSIS — C169 Malignant neoplasm of stomach, unspecified: Secondary | ICD-10-CM

## 2018-12-18 DIAGNOSIS — Z5111 Encounter for antineoplastic chemotherapy: Secondary | ICD-10-CM | POA: Insufficient documentation

## 2018-12-18 DIAGNOSIS — Z87891 Personal history of nicotine dependence: Secondary | ICD-10-CM | POA: Diagnosis not present

## 2018-12-18 DIAGNOSIS — C799 Secondary malignant neoplasm of unspecified site: Secondary | ICD-10-CM

## 2018-12-18 DIAGNOSIS — R220 Localized swelling, mass and lump, head: Secondary | ICD-10-CM | POA: Diagnosis not present

## 2018-12-18 DIAGNOSIS — Z95828 Presence of other vascular implants and grafts: Secondary | ICD-10-CM | POA: Insufficient documentation

## 2018-12-18 DIAGNOSIS — C787 Secondary malignant neoplasm of liver and intrahepatic bile duct: Secondary | ICD-10-CM

## 2018-12-18 DIAGNOSIS — D649 Anemia, unspecified: Secondary | ICD-10-CM

## 2018-12-18 DIAGNOSIS — C168 Malignant neoplasm of overlapping sites of stomach: Secondary | ICD-10-CM

## 2018-12-18 DIAGNOSIS — Z86711 Personal history of pulmonary embolism: Secondary | ICD-10-CM | POA: Diagnosis not present

## 2018-12-18 DIAGNOSIS — Z79899 Other long term (current) drug therapy: Secondary | ICD-10-CM

## 2018-12-18 DIAGNOSIS — Z7901 Long term (current) use of anticoagulants: Secondary | ICD-10-CM

## 2018-12-18 DIAGNOSIS — R590 Localized enlarged lymph nodes: Secondary | ICD-10-CM | POA: Diagnosis not present

## 2018-12-18 LAB — COMPREHENSIVE METABOLIC PANEL
ALT: 32 U/L (ref 0–44)
AST: 33 U/L (ref 15–41)
Albumin: 3.3 g/dL — ABNORMAL LOW (ref 3.5–5.0)
Alkaline Phosphatase: 80 U/L (ref 38–126)
Anion gap: 8 (ref 5–15)
BUN: 14 mg/dL (ref 8–23)
CO2: 27 mmol/L (ref 22–32)
Calcium: 8.5 mg/dL — ABNORMAL LOW (ref 8.9–10.3)
Chloride: 106 mmol/L (ref 98–111)
Creatinine, Ser: 0.86 mg/dL (ref 0.61–1.24)
GFR calc Af Amer: >60 mL/min (ref >60)
GFR calc non Af Amer: >60 mL/min (ref >60)
Glucose, Bld: 111 mg/dL — ABNORMAL HIGH (ref 70–99)
Potassium: 3.9 mmol/L (ref 3.5–5.1)
Sodium: 141 mmol/L (ref 135–145)
Total Bilirubin: 0.6 mg/dL (ref 0.3–1.2)
Total Protein: 6.2 g/dL — ABNORMAL LOW (ref 6.5–8.1)

## 2018-12-18 LAB — CBC WITH DIFFERENTIAL/PLATELET
Abs Immature Granulocytes: 0.01 10*3/uL (ref 0.00–0.07)
Basophils Absolute: 0 10*3/uL (ref 0.0–0.1)
Basophils Relative: 1 %
Eosinophils Absolute: 0.1 10*3/uL (ref 0.0–0.5)
Eosinophils Relative: 3 %
HCT: 30.7 % — ABNORMAL LOW (ref 39.0–52.0)
Hemoglobin: 9.8 g/dL — ABNORMAL LOW (ref 13.0–17.0)
Immature Granulocytes: 0 %
Lymphocytes Relative: 36 %
Lymphs Abs: 1.1 10*3/uL (ref 0.7–4.0)
MCH: 32 pg (ref 26.0–34.0)
MCHC: 31.9 g/dL (ref 30.0–36.0)
MCV: 100.3 fL — ABNORMAL HIGH (ref 80.0–100.0)
Monocytes Absolute: 0.4 10*3/uL (ref 0.1–1.0)
Monocytes Relative: 12 %
Neutro Abs: 1.5 10*3/uL — ABNORMAL LOW (ref 1.7–7.7)
Neutrophils Relative %: 48 %
Platelets: 211 10*3/uL (ref 150–400)
RBC: 3.06 MIL/uL — ABNORMAL LOW (ref 4.22–5.81)
RDW: 19.5 % — ABNORMAL HIGH (ref 11.5–15.5)
WBC: 3.1 10*3/uL — ABNORMAL LOW (ref 4.0–10.5)
nRBC: 0 % (ref 0.0–0.2)

## 2018-12-18 MED ORDER — HEPARIN SOD (PORK) LOCK FLUSH 100 UNIT/ML IV SOLN
500.0000 [IU] | Freq: Once | INTRAVENOUS | Status: AC | PRN
  Administered 2018-12-18: 500 [IU]
  Filled 2018-12-18: qty 5

## 2018-12-18 MED ORDER — SODIUM CHLORIDE 0.9% FLUSH
10.0000 mL | Freq: Once | INTRAVENOUS | Status: AC
  Administered 2018-12-18: 10:00:00 10 mL
  Filled 2018-12-18: qty 10

## 2018-12-18 MED ORDER — SODIUM CHLORIDE 0.9 % IV SOLN
Freq: Once | INTRAVENOUS | Status: AC
  Administered 2018-12-18: 11:00:00 via INTRAVENOUS
  Filled 2018-12-18: qty 250

## 2018-12-18 MED ORDER — DEXTROSE 5 % IV SOLN
Freq: Once | INTRAVENOUS | Status: AC
  Administered 2018-12-18: 13:00:00 via INTRAVENOUS
  Filled 2018-12-18: qty 250

## 2018-12-18 MED ORDER — PALONOSETRON HCL INJECTION 0.25 MG/5ML
INTRAVENOUS | Status: AC
  Filled 2018-12-18: qty 5

## 2018-12-18 MED ORDER — ACETAMINOPHEN 325 MG PO TABS
ORAL_TABLET | ORAL | Status: AC
  Filled 2018-12-18: qty 2

## 2018-12-18 MED ORDER — ACETAMINOPHEN 325 MG PO TABS
650.0000 mg | ORAL_TABLET | Freq: Once | ORAL | Status: AC
  Administered 2018-12-18: 12:00:00 650 mg via ORAL

## 2018-12-18 MED ORDER — DIPHENHYDRAMINE HCL 25 MG PO CAPS
25.0000 mg | ORAL_CAPSULE | Freq: Once | ORAL | Status: AC
  Administered 2018-12-18: 12:00:00 25 mg via ORAL

## 2018-12-18 MED ORDER — SODIUM CHLORIDE 0.9 % IV SOLN
Freq: Once | INTRAVENOUS | Status: AC
  Administered 2018-12-18: 12:00:00 via INTRAVENOUS
  Filled 2018-12-18: qty 5

## 2018-12-18 MED ORDER — PALONOSETRON HCL INJECTION 0.25 MG/5ML
0.2500 mg | Freq: Once | INTRAVENOUS | Status: AC
  Administered 2018-12-18: 0.25 mg via INTRAVENOUS

## 2018-12-18 MED ORDER — TRASTUZUMAB CHEMO 150 MG IV SOLR
6.0000 mg/kg | Freq: Once | INTRAVENOUS | Status: AC
  Administered 2018-12-18: 13:00:00 357 mg via INTRAVENOUS
  Filled 2018-12-18: qty 17

## 2018-12-18 MED ORDER — OXALIPLATIN CHEMO INJECTION 100 MG/20ML
90.0000 mg/m2 | Freq: Once | INTRAVENOUS | Status: AC
  Administered 2018-12-18: 150 mg via INTRAVENOUS
  Filled 2018-12-18: qty 20

## 2018-12-18 MED ORDER — SODIUM CHLORIDE 0.9% FLUSH
10.0000 mL | INTRAVENOUS | Status: DC | PRN
  Administered 2018-12-18: 16:00:00 10 mL
  Filled 2018-12-18: qty 10

## 2018-12-18 MED ORDER — DIPHENHYDRAMINE HCL 25 MG PO CAPS
ORAL_CAPSULE | ORAL | Status: AC
  Filled 2018-12-18: qty 1

## 2018-12-18 NOTE — Telephone Encounter (Signed)
RD working remotely.  Nutrition follow-up completed with patient receiving chemotherapy for metastatic pancreas cancer. Weight is improved and was documented as 137.1 pounds April 8 increased from 134 pounds March 3. Patient reports his appetite is great. He denies nausea, vomiting, constipation, diarrhea and pain. He really has no nutrition concerns.  Nutrition diagnosis: Unintended weight loss improved.  Intervention: Educated patient to continue strategies for increased calorie and protein intake for weight maintenance/weight gain. Encouraged patient to try oral nutrition supplements and offered him samples from the chemo infusion room. Patient was appreciative. Teach back method used.  Monitoring, evaluation, goals: Patient will tolerate increased calories and protein to minimize weight loss.  Next visit: Tuesday, April 28 during infusion by telephone.  **Disclaimer: This note was dictated with voice recognition software. Similar sounding words can inadvertently be transcribed and this note may contain transcription errors which may not have been corrected upon publication of note.**

## 2018-12-18 NOTE — Patient Instructions (Signed)
[facility] Discharge Instructions for Patients Receiving Chemotherapy  Today you received the following chemotherapy agents Oxaliplatin (ELOXATIN) & Trastuzumab (HERCEPTIN).  To help prevent nausea and vomiting after your treatment, we encourage you to take your nausea medication as prescribed.  If you develop nausea and vomiting that is not controlled by your nausea medication, call the clinic.   BELOW ARE SYMPTOMS THAT SHOULD BE REPORTED IMMEDIATELY:  *FEVER GREATER THAN 100.5 F  *CHILLS WITH OR WITHOUT FEVER  NAUSEA AND VOMITING THAT IS NOT CONTROLLED WITH YOUR NAUSEA MEDICATION  *UNUSUAL SHORTNESS OF BREATH  *UNUSUAL BRUISING OR BLEEDING  TENDERNESS IN MOUTH AND THROAT WITH OR WITHOUT PRESENCE OF ULCERS  *URINARY PROBLEMS  *BOWEL PROBLEMS  UNUSUAL RASH Items with * indicate a potential emergency and should be followed up as soon as possible.  Feel free to call the clinic should you have any questions or concerns. The clinic phone number is [phone].  Please show the [facility] at check-in to the Emergency Department and triage nurse.  Coronavirus (COVID-19) Are you at risk?  Are you at risk for the Coronavirus (COVID-19)?  To be considered HIGH RISK for Coronavirus (COVID-19), you have to meet the following criteria:  . Traveled to [place], [place], [place], [place] or [place]; or in the [place] to [place], [place], [place], or [place]; and have fever, cough, and shortness of breath within the last 2 weeks of travel OR . Been in close contact with a person diagnosed with COVID-19 within the last 2 weeks and have fever, cough, and shortness of breath . IF YOU DO NOT MEET THESE CRITERIA, YOU ARE CONSIDERED LOW RISK FOR COVID-19.  What to do if you are HIGH RISK for COVID-19?  Marland Kitchen If you are having a medical emergency, call 911. . Seek medical care right away. Before you go to a doctor's office, urgent care or emergency department,  call ahead and tell them about your recent travel, contact with someone diagnosed with COVID-19, and your symptoms. You should receive instructions from your physician's office regarding next steps of care.  . When you arrive at healthcare provider, tell the healthcare staff immediately you have returned from visiting [place], [place], [place], [place] or [place]; or traveled in the [place] to [place], [place], [place], or [place]; in the last two weeks or you have been in close contact with a person diagnosed with COVID-19 in the last 2 weeks.   . Tell the health care staff about your symptoms: fever, cough and shortness of breath. . After you have been seen by a medical provider, you will be either: o Tested for (COVID-19) and discharged home on quarantine except to seek medical care if symptoms worsen, and asked to  - Stay home and avoid contact with others until you get your results (4-5 days)  - Avoid travel on public transportation if possible (such as bus, train, or airplane) or o Sent to the Emergency Department by EMS for evaluation, COVID-19 testing, and possible admission depending on your condition and test results.  What to do if you are LOW RISK for COVID-19?  Reduce your risk of any infection by using the same precautions used for avoiding the common cold or flu:  Marland Kitchen Wash your hands often with soap and warm water for at least 20 seconds.  If soap and water are not readily available, use an alcohol-based hand sanitizer with at least 60% alcohol.  . If  coughing or sneezing, cover your mouth and nose by coughing or sneezing into the elbow areas of your shirt or coat, into a tissue or into your sleeve (not your hands). . Avoid shaking hands with others and consider head nods or verbal greetings only. . Avoid touching your eyes, nose, or mouth with unwashed hands.  . Avoid close contact with people who are sick. . Avoid places or events with large numbers of people in one  location, like concerts or sporting events. . Carefully consider travel plans you have or are making. . If you are planning any travel outside or inside the [place], visit the CDC's Travelers' Health webpage for the latest health notices. . If you have some symptoms but not all symptoms, continue to monitor at home and seek medical attention if your symptoms worsen. . If you are having a medical emergency, call 911.   [place] / e-Visit: [email or web address]         [facility]: [facility]: [phone]                   [facility]: [phone]

## 2018-12-19 LAB — CANCER ANTIGEN 19-9: CA 19-9: 976 U/mL — ABNORMAL HIGH (ref 0–35)

## 2019-01-02 MED FILL — CAPECITABINE 500 MG TABS: 500 | 21 days supply | Qty: 56 | Fill #3

## 2019-01-06 NOTE — Progress Notes (Signed)
Peter Wood  [phone] [phone]     ID: Peter Wood DOB: Jan 15, 1935  MR#: [id number]  BZJ#:[id number]  Patient Care Team: Cassandria Anger, MD as PCP - General Milus Banister, MD as Attending Physician (Gastroenterology) Peter Wood, Peter Dad, MD as Consulting Physician (Oncology) Gatha Mayer, MD as Consulting Physician (Gastroenterology) [name], MD as Consulting Physician (Ophthalmology) Ramon Dredge, MD as Referring Physician (Radiation Oncology) OTHER MD:   CHIEF COMPLAINT: Metastatic gastric adenocarcinoma  CURRENT TREATMENT: oxaliplatin, capecitabine, trastuzumab   INTERVAL HISTORY: Peter Wood is seen today for follow-up and treatment of his metastatic gastric adenocarcinoma. He is accompanied by his wife via FaceTime.   He continues on oxaliplatin and trastuzumab, today is day 1, cycle 4. He notes that his hands and feet are a little darker. He has had no pain, mouth sores, fever, or cough. His appetite has been good.  She also continues on capecitabine. He tolerates this well and without any noticeable side effects.     Since his last visit here, he has not undergone any additional studies. We are following his tumor marker: Results for Peter Wood, Peter Wood (MRN [id number]) as of 01/07/2019 10:59  Ref. Range 10/23/2018 10:17 11/13/2018 10:52 12/18/2018 10:16  CA 19-9 Latest Ref Range: 0 - 35 U/mL 75,452 (H) 7,694 (H) 976 (H)     REVIEW OF SYSTEMS: For exercise, Soloman likes to walk with his wife, usually down the street and back which takes about 15 minutes. Peter Wood denies unusual headaches, visual changes, nausea, vomiting, or dizziness. There has been no unusual cough, phlegm production, or pleurisy. This been no change in bowel or bladder habits. The patient denies unexplained fatigue or unexplained weight loss, bleeding, rash, or fever. A detailed review of systems was otherwise noncontributory.    HISTORY OF  CURRENT ILLNESS: From the original intake note:  Peter Wood presented to the emergency room 09/07/2018 with mid/upper abdominal pain.  He reported weight loss of 25 pounds over the past 6 months and significant anorexia.  His liver function tests were abnormal and a right upper quadrant ultrasound was obtained.  This was read as concerning for malignancy and a CT of the abdomen and pelvis with contrast was obtained the same day, showing  1. Too numerous to count hypodense masses of the liver concerning for hepatic metastasis. In the region of the porta hepatis, there are hypodense lesions more likely associated with the liver though the pancreatic head is partially obscured by a 3.2 cm hypodense mass. Findings are more likely associated with the liver and less likely an isolated pancreatic mass. 2. Upper abdominal mesenteric and peripancreatic lymphadenopathy suspicious for metastatic disease. 3. 6 mm sclerotic density in the left iliac bone adjacent to the SI joint. Given history of prostate cancer, differential considerations would include osteoblastic metastasis or possibly bone island. Showed only diverticular disease.  He was referred to GI and Dr. Ardis Hughs notes a colonoscopy performed 04/23/2017 makes the possibility of colon cancer unlikely.  Further work-up was planned, but on 09/19/2018 the patient again presented to the emergency room with chest pain, now in a different area, and a staging CT scan of the chest showed filling defects in the right lower lobe pulmonary arteries consistent with embolism.  There was a small pericardial effusion and an additional 2.4 cm left thyroid mass.  Aortic atherosclerosis was noted  He was started on intravenous heparin and on 09/20/2018 underwent upper endoscopy under Dr. Carlean Purl showing a giant  nonbleeding cratered gastric ulcer in the posterior wall of the stomach.  Biopsies of this procedure showed (SZA 20-187) adenocarcinoma.    At that point we  were consulted and we obtained tumor markers which included a CEA of 1318.2, a CA 19-9 of 67,479, a normal AFP and a normal PSA.  I discussed the case with pathology and they do not feel that additional testing would be helpful in terms of discriminating between a primary gastric and a primary biliary/pancreatic tumor.  The patient's subsequent history is as detailed below.   PAST MEDICAL HISTORY: Past Medical History:  Diagnosis Date  . Arthritis   . Diverticulosis of colon   . Family history of breast cancer   . Family history of prostate cancer   . HTN (hypertension)   . Hx of colonic polyp   . Pneumonia    as a child/baby  . Poor circulation of extremity    right leg  . Prostate cancer ([place]) 2015   prostate cancer - radiation treated     PAST SURGICAL HISTORY: Past Surgical History:  Procedure Laterality Date  . [place] VITRECTOMY WITH 20 GAUGE MVR PORT FOR MACULAR HOLE Right 09/19/2016   Procedure: 25 GAUGE PARS PLANA VITRECTOMY WITH 20 GAUGE MVR PORT FOR MACULAR HOLE, MEMBRANE PEEL, SERUM PATCH, GAS FLUID EXCHANGE, HEADSCOPE LASER;  Surgeon: [name], MD;  Location: [facility];  Service: Ophthalmology;  Laterality: Right;  . BIOPSY  09/20/2018   Procedure: BIOPSY;  Surgeon: Gatha Mayer, MD;  Location: [facility] ENDOSCOPY;  Service: Endoscopy;;  . CATARACT EXTRACTION  08/27/12   Right  . COLONOSCOPY    . ESOPHAGOGASTRODUODENOSCOPY (EGD) WITH PROPOFOL N/A 09/20/2018   Procedure: ESOPHAGOGASTRODUODENOSCOPY (EGD) WITH PROPOFOL;  Surgeon: Gatha Mayer, MD;  Location: [facility];  Service: Endoscopy;  Laterality: N/A;  . HAND SURGERY  2016   right thumb  . IR IMAGING GUIDED PORT INSERTION  11/04/2018  . KNEE SURGERY Left 1989     FAMILY HISTORY: Family History  Problem Relation Age of Onset  . Prostate cancer Father        dx >50  . Hypertension Mother   . Ulcers Mother   . Breast cancer Sister        dx 20's  . Prostate cancer Brother        dx under  57, metasttic, cause of his death  . Ulcers Brother   . Ulcers Sister    Peter Wood's father died from heart complications with a pacemaker at age 14; Peter Wood's father also has prostate cancer. Patients' mother died from natural causes at age 24. The patient has 4 brothers and 3 sisters. One of Thurston sisters had breast cancer in her 27s and a brother had prostate cancer. Patient denies anyone in her family having ovarian, or pancreatic cancer. Dieter mentions that his youngest sister and his brother with prostate cancer also had stomach ulcers.    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 His wife, Peter Wood, is a retired [employer] summer Radiographer, therapeutic. As of 09/2018, they have been married for 56 years. They have 4 children, Peter Wood, Peter Wood, Peter Wood, and Peter Wood. Peter Wood lives in [place] and works at the [employer]. Peter Wood lives in [place] and works at [employer] and as a part Horticulturist, commercial. 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 Peter Wood Low lives in [place] and works at [employer]. Kobie has 7 grandchildren and 1 step great grandchild.  He attends [employer].     ADVANCED DIRECTIVES: His wife, Peter Wood, is automatically his healthcare power of attorney.     HEALTH MAINTENANCE: Social History   Tobacco Use  . Smoking status: Former Smoker    Years: 4.00    Last attempt to quit: 09/12/1967    Years since quitting: 51.3  . Smokeless tobacco: Never Used  Substance Use Topics  . Alcohol use: No  . Drug use: No    Colonoscopy: 2018/[facility]  PSA: 0.54 on 09/18/2018  No Known Allergies  Current Outpatient Medications  Medication Sig Dispense Refill  . amLODipine (NORVASC) 5 MG tablet TAKE ONE TABLET BY MOUTH ONE TIME DAILY  90 tablet 2  . apixaban (ELIQUIS) 5 MG TABS tablet Take 1 tablet (5 mg total) by mouth 2 (two) times daily. 60 tablet 11  . benazepril (LOTENSIN) 40 MG tablet TAKE ONE TABLET BY MOUTH ONE TIME DAILY  90  tablet 2  . capecitabine (XELODA) 500 MG tablet Take 2 tablets (1,000 mg total) by mouth 2 (two) times daily after a meal. Take on days 1-14 of chemotherapy. Repeat every 21 days. 56 tablet 4  . Cholecalciferol 1000 UNITS tablet Take 1,000 Units by mouth daily.     Marland Kitchen latanoprost (XALATAN) 0.005 % ophthalmic solution Place 1 drop into both eyes at bedtime.     . lidocaine-prilocaine (EMLA) cream Apply to affected area once 30 g 3  . ondansetron (ZOFRAN) 4 MG tablet Take 1 tablet (4 mg total) by mouth every 8 (eight) hours as needed for nausea or vomiting. 20 tablet 1  . pantoprazole (PROTONIX) 40 MG tablet Take 1 tablet (40 mg total) by mouth 2 (two) times daily before a meal. 60 tablet 0  . prochlorperazine (COMPAZINE) 10 MG tablet Take 1 tablet (10 mg total) by mouth every 6 (six) hours as needed (Nausea or vomiting). 30 tablet 1   No current facility-administered medications for this visit.    Facility-Administered Medications Ordered in Other Visits  Medication Dose Route Frequency Provider Last Rate Last Dose  . sodium chloride flush (NS) 0.9 % injection 10 mL  10 mL Intravenous PRN Peter Wood, Peter Dad, MD         OBJECTIVE: Elderly African-American man who appears well Vitals:   01/07/19 1058  BP: (!) 154/77  Pulse: 61  Resp: 16  Temp: 98.3 F (36.8 C)  SpO2: 100%     Body mass index is 23.86 kg/m.   Wt Readings from Last 3 Encounters:  01/07/19 141 lb 3.2 oz (64 kg)  12/18/18 137 lb 1.6 oz (62.2 kg)  11/26/18 135 lb 1.6 oz (61.3 kg)  ECOG FS:1 - Symptomatic but completely ambulatory  Sclerae unicteric, EOMs intact Wearing a mask No cervical or supraclavicular adenopathy Lungs no rales or rhonchi Heart regular rate and rhythm Abd soft, nontender, positive bowel sounds Neuro: nonfocal, well oriented, appropriate affect  LAB RESULTS:  CMP     Component Value Date/Time   NA 141 12/18/2018 1016   K 3.9 12/18/2018 1016   CL 106 12/18/2018 1016   CO2 27 12/18/2018 1016    GLUCOSE 111 (H) 12/18/2018 1016   GLUCOSE 105 (H) 09/13/2006 0900   BUN 14 12/18/2018 1016   CREATININE 0.86 12/18/2018 1016   CREATININE 1.02 10/16/2018 0936   CALCIUM 8.5 (L) 12/18/2018 1016   PROT 6.2 (L) 12/18/2018 1016   ALBUMIN 3.3 (L) 12/18/2018 1016   AST 33 12/18/2018 1016   AST 76 (H)  10/16/2018 0936   ALT 32 12/18/2018 1016   ALT 47 (H) 10/16/2018 0936   ALKPHOS 80 12/18/2018 1016   BILITOT 0.6 12/18/2018 1016   BILITOT 0.7 10/16/2018 0936   GFRNONAA >60 12/18/2018 1016   GFRNONAA >60 10/16/2018 0936   GFRAA >60 12/18/2018 1016   GFRAA >60 10/16/2018 0936    No results found for: TOTALPROTELP, ALBUMINELP, A1GS, A2GS, BETS, BETA2SER, GAMS, MSPIKE, SPEI  No results found for: KPAFRELGTCHN, LAMBDASER, KAPLAMBRATIO  Lab Results  Component Value Date   WBC 3.4 (L) 01/07/2019   NEUTROABS 1.7 01/07/2019   HGB 9.6 (L) 01/07/2019   HCT 30.0 (L) 01/07/2019   MCV 99.0 01/07/2019   PLT 185 01/07/2019    '@LASTCHEMISTRY'$ @  No results found for: LABCA2  No components found for: [id number]  No results for input(s): INR in the last 168 hours.  No results found for: LABCA2  Lab Results  Component Value Date   WUJ811 914 (H) 12/18/2018    No results found for: NWG956  No results found for: OZH086  No results found for: CA2729  No components found for: HGQUANT  Lab Results  Component Value Date   CEA1 1,237.70 (H) 11/06/2018   /  CEA ([facility])  Date Value Ref Range Status  11/06/2018 1,237.70 (H) 0.00 - 5.00 ng/mL Final    Comment:    (NOTE) This test was performed using Architect's Chemiluminescent Microparticle Immunoassay. Values obtained from different assay methods cannot be used interchangeably. Please note that 5-10% of patients who smoke may see CEA levels up to 6.9 ng/mL. Performed at [facility], [place] [address] [place], [place] [address]      No results found for: AFPTUMOR  No results found for:  [place]  No results found for: PSA1  Appointment on 01/07/2019  Component Date Value Ref Range Status  . WBC 01/07/2019 3.4* 4.0 - 10.5 K/uL Final  . RBC 01/07/2019 3.03* 4.22 - 5.81 MIL/uL Final  . Hemoglobin 01/07/2019 9.6* 13.0 - 17.0 g/dL Final  . HCT 01/07/2019 30.0* 39.0 - 52.0 % Final  . MCV 01/07/2019 99.0  80.0 - 100.0 fL Final  . MCH 01/07/2019 31.7  26.0 - 34.0 pg Final  . MCHC 01/07/2019 32.0  30.0 - 36.0 g/dL Final  . RDW 01/07/2019 20.2* 11.5 - 15.5 % Final  . Platelets 01/07/2019 185  150 - 400 K/uL Final  . nRBC 01/07/2019 0.0  0.0 - 0.2 % Final  . Neutrophils Relative % 01/07/2019 51  % Final  . Neutro Abs 01/07/2019 1.7  1.7 - 7.7 K/uL Final  . Lymphocytes Relative 01/07/2019 30  % Final  . Lymphs Abs 01/07/2019 1.0  0.7 - 4.0 K/uL Final  . Monocytes Relative 01/07/2019 16  % Final  . Monocytes Absolute 01/07/2019 0.6  0.1 - 1.0 K/uL Final  . Eosinophils Relative 01/07/2019 2  % Final  . Eosinophils Absolute 01/07/2019 0.1  0.0 - 0.5 K/uL Final  . Basophils Relative 01/07/2019 1  % Final  . Basophils Absolute 01/07/2019 0.0  0.0 - 0.1 K/uL Final  . Immature Granulocytes 01/07/2019 0  % Final  . Abs Immature Granulocytes 01/07/2019 0.01  0.00 - 0.07 K/uL Final   Performed at [facility], [place] [address], [place], [place] [address]    (this displays the last labs from the last 3 days)  No results found for: TOTALPROTELP, ALBUMINELP, A1GS, A2GS, BETS, BETA2SER, GAMS, MSPIKE, SPEI (this displays SPEP labs)  No results found for: KPAFRELGTCHN, LAMBDASER, KAPLAMBRATIO (kappa/lambda light chains)  No results found for: HGBA, HGBA2QUANT, HGBFQUANT, HGBSQUAN (Hemoglobinopathy evaluation)   No results found for: LDH  Lab Results  Component Value Date   IRON 38 (L) 09/21/2018   TIBC 251 09/21/2018   IRONPCTSAT 15 (L) 09/21/2018   (Iron and TIBC)  Lab Results  Component Value Date   FERRITIN 138 09/21/2018    Urinalysis     Component Value Date/Time   COLORURINE YELLOW 09/22/2018 [place] 09/22/2018 0541   LABSPEC 1.014 09/22/2018 [place] 6.0 09/22/2018 [place] 09/22/2018 0541   GLUCOSEU NEGATIVE 07/20/2017 1056   [place] 09/22/2018 0541   BILIRUBINUR NEGATIVE 09/22/2018 0541   KETONESUR 5 (A) 09/22/2018 0541   PROTEINUR NEGATIVE 09/22/2018 0541   UROBILINOGEN 0.2 07/20/2017 1056   NITRITE NEGATIVE 09/22/2018 0541   LEUKOCYTESUR NEGATIVE 09/22/2018 0541     STUDIES:  No results found.   ELIGIBLE FOR AVAILABLE RESEARCH PROTOCOL: no   ASSESSMENT: 84 y.o. [place], [place] man status post gastric biopsy 09/20/2018 showing adenocarcinoma, with a CA 19-9 greater than 65,000; staging studies showing an apparent mass adjacent to the head of the pancreas, innumerable liver lesions, upper abdominal mesenteric and peripancreatic lymphadenopathy, and an isolated sclerotic density in the left iliac bone  (a) genomics requested on gastric biopsy showed the tumor to be HER-2 amplified at 3+, PD-L1 positive, and T p53 mutated.  MSI is stable, mismatch repair status is proficient and the tumor mutational burden is intermediate.  (1) chest CT scan 09/19/2018 shows right lower lobe pulmonary embolism  (a) on intravenous heparin started 09/19/2018, transitioned to apixaban 10/01/2018  (2) genetics testing 10/04/2018: negative  (a) family history of prostate and early breast cancer; patient has a history of prostate cancer  (3) started gemcitabine/Abraxane 10/09/2018, repeated days 1, 8 and 15 of each 28-day cycle  (a) stopped after 1 cycle (3 doses) with reassessment of diagnosis based on CARIS results, noted above  (4) to start oxaliplatin, capecitabine and trastuzumab 11/06/2018  (a) baseline echocardiogram 09/20/2018 shows an ejection fraction in the 55-60% range  (b) oxaliplatin and trastuzumab will be given every 21 days  (c) capecitabine initial dose will be 1000  mg twice daily for 14 days of every 21-day cycle   PLAN: Jonah is having a very good response to treatment, with a significant drop in his tumor marker, and clinically he is essentially asymptomatic from his tumor and from the treatment.  Given the response I do not see any need to increase the capecitabine dose.  It is very favorable that he has not developed any peripheral neuropathy, diarrhea, or other skin changes from the capecitabine except for some hyperpigmentation which is expected and asymptomatic.  He will proceed to treatment today and then again in 3 weeks.  I will see him with each treatment and then before his June treatment he will be restaged  I am dropping his Eliquis dose to 2.5 mg twice daily  He knows to call for any other problems that may develop before the next visit.  Peter Wood. Cadan Maggart, MD 01/07/19 11:13 AM Medical Oncology and Hematology [facility] [address] [place], [place] [address] Tel. [phone]    Fax. [phone]   I, Jacqualyn Posey am acting as a Education administrator for Chauncey Cruel, MD.   I, Lurline Del MD, have reviewed the above documentation for accuracy and completeness, and I agree with  the above.

## 2019-01-07 ENCOUNTER — Inpatient Hospital Stay (HOSPITAL_BASED_OUTPATIENT_CLINIC_OR_DEPARTMENT_OTHER): Payer: Medicare Other | Admitting: Oncology

## 2019-01-07 ENCOUNTER — Other Ambulatory Visit: Payer: Self-pay

## 2019-01-07 ENCOUNTER — Inpatient Hospital Stay: Payer: Medicare Other

## 2019-01-07 ENCOUNTER — Inpatient Hospital Stay: Payer: Medicare Other | Admitting: Nutrition

## 2019-01-07 VITALS — BP 154/77 | HR 61 | Temp 98.3°F | Resp 16 | Ht 64.5 in | Wt 141.2 lb

## 2019-01-07 DIAGNOSIS — Z86711 Personal history of pulmonary embolism: Secondary | ICD-10-CM

## 2019-01-07 DIAGNOSIS — Z95828 Presence of other vascular implants and grafts: Secondary | ICD-10-CM

## 2019-01-07 DIAGNOSIS — C169 Malignant neoplasm of stomach, unspecified: Secondary | ICD-10-CM

## 2019-01-07 DIAGNOSIS — Z7901 Long term (current) use of anticoagulants: Secondary | ICD-10-CM

## 2019-01-07 DIAGNOSIS — R590 Localized enlarged lymph nodes: Secondary | ICD-10-CM | POA: Diagnosis not present

## 2019-01-07 DIAGNOSIS — C799 Secondary malignant neoplasm of unspecified site: Secondary | ICD-10-CM

## 2019-01-07 DIAGNOSIS — C787 Secondary malignant neoplasm of liver and intrahepatic bile duct: Secondary | ICD-10-CM

## 2019-01-07 DIAGNOSIS — C168 Malignant neoplasm of overlapping sites of stomach: Secondary | ICD-10-CM

## 2019-01-07 DIAGNOSIS — Z87891 Personal history of nicotine dependence: Secondary | ICD-10-CM | POA: Diagnosis not present

## 2019-01-07 DIAGNOSIS — R978 Other abnormal tumor markers: Secondary | ICD-10-CM

## 2019-01-07 DIAGNOSIS — D649 Anemia, unspecified: Secondary | ICD-10-CM

## 2019-01-07 DIAGNOSIS — Z5111 Encounter for antineoplastic chemotherapy: Secondary | ICD-10-CM | POA: Diagnosis not present

## 2019-01-07 DIAGNOSIS — C164 Malignant neoplasm of pylorus: Secondary | ICD-10-CM

## 2019-01-07 DIAGNOSIS — Z79899 Other long term (current) drug therapy: Secondary | ICD-10-CM

## 2019-01-07 DIAGNOSIS — C61 Malignant neoplasm of prostate: Secondary | ICD-10-CM

## 2019-01-07 LAB — COMPREHENSIVE METABOLIC PANEL
ALT: 27 U/L (ref 0–44)
AST: 30 U/L (ref 15–41)
Albumin: 3.3 g/dL — ABNORMAL LOW (ref 3.5–5.0)
Alkaline Phosphatase: 74 U/L (ref 38–126)
Anion gap: 8 (ref 5–15)
BUN: 16 mg/dL (ref 8–23)
CO2: 28 mmol/L (ref 22–32)
Calcium: 8 mg/dL — ABNORMAL LOW (ref 8.9–10.3)
Chloride: 104 mmol/L (ref 98–111)
Creatinine, Ser: 0.95 mg/dL (ref 0.61–1.24)
GFR calc Af Amer: >60 mL/min (ref >60)
GFR calc non Af Amer: >60 mL/min (ref >60)
Glucose, Bld: 93 mg/dL (ref 70–99)
Potassium: 3.8 mmol/L (ref 3.5–5.1)
Sodium: 140 mmol/L (ref 135–145)
Total Bilirubin: 0.6 mg/dL (ref 0.3–1.2)
Total Protein: 6.2 g/dL — ABNORMAL LOW (ref 6.5–8.1)

## 2019-01-07 LAB — CBC WITH DIFFERENTIAL/PLATELET
Abs Immature Granulocytes: 0.01 10*3/uL (ref 0.00–0.07)
Basophils Absolute: 0 10*3/uL (ref 0.0–0.1)
Basophils Relative: 1 %
Eosinophils Absolute: 0.1 10*3/uL (ref 0.0–0.5)
Eosinophils Relative: 2 %
HCT: 30 % — ABNORMAL LOW (ref 39.0–52.0)
Hemoglobin: 9.6 g/dL — ABNORMAL LOW (ref 13.0–17.0)
Immature Granulocytes: 0 %
Lymphocytes Relative: 30 %
Lymphs Abs: 1 10*3/uL (ref 0.7–4.0)
MCH: 31.7 pg (ref 26.0–34.0)
MCHC: 32 g/dL (ref 30.0–36.0)
MCV: 99 fL (ref 80.0–100.0)
Monocytes Absolute: 0.6 10*3/uL (ref 0.1–1.0)
Monocytes Relative: 16 %
Neutro Abs: 1.7 10*3/uL (ref 1.7–7.7)
Neutrophils Relative %: 51 %
Platelets: 185 10*3/uL (ref 150–400)
RBC: 3.03 MIL/uL — ABNORMAL LOW (ref 4.22–5.81)
RDW: 20.2 % — ABNORMAL HIGH (ref 11.5–15.5)
WBC: 3.4 10*3/uL — ABNORMAL LOW (ref 4.0–10.5)
nRBC: 0 % (ref 0.0–0.2)

## 2019-01-07 LAB — CEA (IN HOUSE-CHCC): CEA (CHCC-In House): 61.52 ng/mL — ABNORMAL HIGH (ref 0.00–5.00)

## 2019-01-07 MED ORDER — ACETAMINOPHEN 325 MG PO TABS
650.0000 mg | ORAL_TABLET | Freq: Once | ORAL | Status: AC
  Administered 2019-01-07: 650 mg via ORAL

## 2019-01-07 MED ORDER — TRASTUZUMAB CHEMO 150 MG IV SOLR
6.0000 mg/kg | Freq: Once | INTRAVENOUS | Status: AC
  Administered 2019-01-07: 13:00:00 357 mg via INTRAVENOUS
  Filled 2019-01-07: qty 17

## 2019-01-07 MED ORDER — PALONOSETRON HCL INJECTION 0.25 MG/5ML
0.2500 mg | Freq: Once | INTRAVENOUS | Status: AC
  Administered 2019-01-07: 0.25 mg via INTRAVENOUS

## 2019-01-07 MED ORDER — OXALIPLATIN CHEMO INJECTION 100 MG/20ML: 85.0000 mg/m2 | Freq: Once | INTRAVENOUS | Status: DC

## 2019-01-07 MED ORDER — DEXTROSE 5 % IV SOLN
Freq: Once | INTRAVENOUS | Status: AC
  Administered 2019-01-07: 14:00:00 via INTRAVENOUS
  Filled 2019-01-07: qty 250

## 2019-01-07 MED ORDER — APIXABAN 2.5 MG PO TABS: 5.0000 mg | ORAL_TABLET | Freq: Two times a day (BID) | ORAL | 12 refills | Status: DC

## 2019-01-07 MED ORDER — DIPHENHYDRAMINE HCL 25 MG PO CAPS
ORAL_CAPSULE | ORAL | Status: AC
  Filled 2019-01-07: qty 1

## 2019-01-07 MED ORDER — DIPHENHYDRAMINE HCL 25 MG PO CAPS
25.0000 mg | ORAL_CAPSULE | Freq: Once | ORAL | Status: AC
  Administered 2019-01-07: 12:00:00 25 mg via ORAL

## 2019-01-07 MED ORDER — ACETAMINOPHEN 325 MG PO TABS
ORAL_TABLET | ORAL | Status: AC
  Filled 2019-01-07: qty 2

## 2019-01-07 MED ORDER — SODIUM CHLORIDE 0.9 % IV SOLN
Freq: Once | INTRAVENOUS | Status: AC
  Administered 2019-01-07: 12:00:00 via INTRAVENOUS
  Filled 2019-01-07: qty 250

## 2019-01-07 MED ORDER — HEPARIN SOD (PORK) LOCK FLUSH 100 UNIT/ML IV SOLN
500.0000 [IU] | Freq: Once | INTRAVENOUS | Status: AC | PRN
  Administered 2019-01-07: 500 [IU]
  Filled 2019-01-07: qty 5

## 2019-01-07 MED ORDER — SODIUM CHLORIDE 0.9% FLUSH
10.0000 mL | Freq: Once | INTRAVENOUS | Status: AC
  Administered 2019-01-07: 10 mL
  Filled 2019-01-07: qty 10

## 2019-01-07 MED ORDER — PALONOSETRON HCL INJECTION 0.25 MG/5ML
INTRAVENOUS | Status: AC
  Filled 2019-01-07: qty 5

## 2019-01-07 MED ORDER — SODIUM CHLORIDE 0.9 % IV SOLN
Freq: Once | INTRAVENOUS | Status: AC
  Administered 2019-01-07: 12:00:00 via INTRAVENOUS
  Filled 2019-01-07: qty 5

## 2019-01-07 MED ORDER — SODIUM CHLORIDE 0.9% FLUSH
10.0000 mL | INTRAVENOUS | Status: DC | PRN
  Administered 2019-01-07: 10 mL
  Filled 2019-01-07: qty 10

## 2019-01-07 MED ORDER — OXALIPLATIN CHEMO INJECTION 100 MG/20ML
89.0000 mg/m2 | Freq: Once | INTRAVENOUS | Status: AC
  Administered 2019-01-07: 14:00:00 150 mg via INTRAVENOUS
  Filled 2019-01-07: qty 10

## 2019-01-07 NOTE — Patient Instructions (Signed)
[place] Discharge Instructions for Patients Receiving Chemotherapy  Today you received the following chemotherapy agents Oxaliplatin (ELOXATIN) & Trastuzumab (HERCEPTIN).  To help prevent nausea and vomiting after your treatment, we encourage you to take your nausea medication as prescribed.  If you develop nausea and vomiting that is not controlled by your nausea medication, call the clinic.   BELOW ARE SYMPTOMS THAT SHOULD BE REPORTED IMMEDIATELY:  *FEVER GREATER THAN 100.5 F  *CHILLS WITH OR WITHOUT FEVER  NAUSEA AND VOMITING THAT IS NOT CONTROLLED WITH YOUR NAUSEA MEDICATION  *UNUSUAL SHORTNESS OF BREATH  *UNUSUAL BRUISING OR BLEEDING  TENDERNESS IN MOUTH AND THROAT WITH OR WITHOUT PRESENCE OF ULCERS  *URINARY PROBLEMS  *BOWEL PROBLEMS  UNUSUAL RASH Items with * indicate a potential emergency and should be followed up as soon as possible.  Feel free to call the clinic should you have any questions or concerns. The clinic phone number is [phone].  Please show the [facility] at check-in to the Emergency Department and triage nurse.  Coronavirus (COVID-19) Are you at risk?  Are you at risk for the Coronavirus (COVID-19)?  To be considered HIGH RISK for Coronavirus (COVID-19), you have to meet the following criteria:  . Traveled to [place], [place], [place], [place] or [place]; or in the [place] to [place], [place], [place], or [place]; and have fever, cough, and shortness of breath within the last 2 weeks of travel OR . Been in close contact with a person diagnosed with COVID-19 within the last 2 weeks and have fever, cough, and shortness of breath . IF YOU DO NOT MEET THESE CRITERIA, YOU ARE CONSIDERED LOW RISK FOR COVID-19.  What to do if you are HIGH RISK for COVID-19?  Marland Kitchen If you are having a medical emergency, call 911. . Seek medical care right away. Before you go to a doctor's office, urgent care or emergency department,  call ahead and tell them about your recent travel, contact with someone diagnosed with COVID-19, and your symptoms. You should receive instructions from your physician's office regarding next steps of care.  . When you arrive at healthcare provider, tell the healthcare staff immediately you have returned from visiting [place], [place], [place], [place] or [place]; or traveled in the [place] to [place], [place], [place], or [place]; in the last two weeks or you have been in close contact with a person diagnosed with COVID-19 in the last 2 weeks.   . Tell the health care staff about your symptoms: fever, cough and shortness of breath. . After you have been seen by a medical provider, you will be either: o Tested for (COVID-19) and discharged home on quarantine except to seek medical care if symptoms worsen, and asked to  - Stay home and avoid contact with others until you get your results (4-5 days)  - Avoid travel on public transportation if possible (such as bus, train, or airplane) or o Sent to the Emergency Department by EMS for evaluation, COVID-19 testing, and possible admission depending on your condition and test results.  What to do if you are LOW RISK for COVID-19?  Reduce your risk of any infection by using the same precautions used for avoiding the common cold or flu:  Marland Kitchen Wash your hands often with soap and warm water for at least 20 seconds.  If soap and water are not readily available, use an alcohol-based hand sanitizer with at least 60% alcohol.  . If  coughing or sneezing, cover your mouth and nose by coughing or sneezing into the elbow areas of your shirt or coat, into a tissue or into your sleeve (not your hands). . Avoid shaking hands with others and consider head nods or verbal greetings only. . Avoid touching your eyes, nose, or mouth with unwashed hands.  . Avoid close contact with people who are sick. . Avoid places or events with large numbers of people in one  location, like concerts or sporting events. . Carefully consider travel plans you have or are making. . If you are planning any travel outside or inside the [place], visit the CDC's Travelers' Health webpage for the latest health notices. . If you have some symptoms but not all symptoms, continue to monitor at home and seek medical attention if your symptoms worsen. . If you are having a medical emergency, call 911.   [place] / e-Visit: [email or web address]         [facility]: [facility]: [phone]                   [facility]: [phone]

## 2019-01-07 NOTE — Addendum Note (Signed)
Addended by: Chauncey Cruel on: 01/07/2019 11:21 AM   Modules accepted: Orders

## 2019-01-07 NOTE — Progress Notes (Signed)
RD working remotely.  Nutrition follow up completed with patient during infusion for metastatic pancreas cancer. Weight improved and documented as 141.2 pounds, improved from 137.1 pounds April 8. He is eating well and says he loved the Ensure and Boost samples I gave him at the last treatment. He denies nutrition impact symptoms.  Nutrition Diagnosis: Weight loss improved.  Intervention: Educated to continue increased oral intake for weight maintenance/weight gain. Provide additional samples and coupons.  Monitoring, Evaluation, Goals: Patient will continue increased calories and protein for weight gain/maintenance.  Next Visit: To be scheduled as needed.

## 2019-01-07 NOTE — Patient Instructions (Signed)

## 2019-01-07 NOTE — Progress Notes (Signed)
Dr. Jana Hakim Patient is OK to treat with echo of 1/10

## 2019-01-08 LAB — CANCER ANTIGEN 19-9: CA 19-9: 416 U/mL — ABNORMAL HIGH (ref 0–35)

## 2019-01-09 ENCOUNTER — Other Ambulatory Visit: Payer: Self-pay | Admitting: *Deleted

## 2019-01-09 ENCOUNTER — Telehealth: Payer: Self-pay | Admitting: Oncology

## 2019-01-09 MED ORDER — APIXABAN 2.5 MG PO TABS: 2.5000 mg | ORAL_TABLET | Freq: Two times a day (BID) | ORAL | 12 refills | Status: DC

## 2019-01-09 NOTE — Telephone Encounter (Signed)
Called regarding schedule °

## 2019-01-15 ENCOUNTER — Ambulatory Visit: Payer: Medicare Other | Admitting: Internal Medicine

## 2019-01-22 ENCOUNTER — Ambulatory Visit: Payer: Medicare Other | Admitting: Internal Medicine

## 2019-01-23 MED FILL — CAPECITABINE 500 MG TABS: 500 | 21 days supply | Qty: 56 | Fill #4

## 2019-01-27 NOTE — Progress Notes (Signed)
Peter Wood  Telephone:([phone]) [phone] Fax:(336) [phone]     ID: Peter Wood DOB: [birth year]  MR#: [id number]  DQQ#:[id number]  Patient Care Team: Cassandria Anger, MD as PCP - General Milus Banister, MD as Attending Physician (Gastroenterology) Ornella Coderre, Virgie Dad, MD as Consulting Physician (Oncology) Gatha Mayer, MD as Consulting Physician (Gastroenterology) [name], MD as Consulting Physician (Ophthalmology) Ramon Dredge, MD as Referring Physician (Radiation Oncology) OTHER MD:   CHIEF COMPLAINT: Metastatic gastric adenocarcinoma  CURRENT TREATMENT: oxaliplatin, capecitabine, trastuzumab   INTERVAL HISTORY: Peter Wood is seen today for follow-up and treatment of his metastatic gastric adenocarcinoma. His wife was present for our discussion via speaker phone.  He continues on oxaliplatin and trastuzumab. Today is day 1 cycle 5. He tolerates this well with no noticeable side effects.   He also continues on capecitabine. He tolerates this well and without any noticeable side effects.   Since his last visit here, he has not undergone any additional studies. He is scheduled to undergo abdomen/pelvis CT on 02/11/2019.  We are following his tumor marker: Lab Results  Component Value Date   Peter Wood (H) 01/07/2019   Peter Wood (H) 12/18/2018   Peter Wood,Peter Wood (H) 11/13/2018   Peter Wood,Peter Wood (H) 10/23/2018   His CEA also has decreased from greater than 1200 to less than 100   REVIEW OF SYSTEMS: Peter Wood reports doing well overall. He stays in the house, and walks around the house to get exercise. He notes he has been sleeping and eating well. The patient denies unusual headaches, visual changes, nausea, vomiting, stiff neck, dizziness, or gait imbalance. There has been no cough, phlegm production, or pleurisy, no chest pain or pressure, and no change in bowel or bladder habits. The patient denies fever, rash, bleeding, unexplained fatigue or  unexplained weight loss. A detailed review of systems was otherwise entirely negative.   HISTORY OF CURRENT ILLNESS: From the original intake note:  Peter Wood presented to the emergency room 09/07/2018 with mid/upper abdominal pain.  He reported weight loss of 25 pounds over the past 6 months and significant anorexia.  His liver function tests were abnormal and a right upper quadrant ultrasound was obtained.  This was read as concerning for malignancy and a CT of the abdomen and pelvis with contrast was obtained the same day, showing  1. Too numerous to count hypodense masses of the liver concerning for hepatic metastasis. In the region of the porta hepatis, there are hypodense lesions more likely associated with the liver though the pancreatic head is partially obscured by a 3.2 cm hypodense mass. Findings are more likely associated with the liver and less likely an isolated pancreatic mass. 2. Upper abdominal mesenteric and peripancreatic lymphadenopathy suspicious for metastatic disease. 3. 6 mm sclerotic density in the left iliac bone adjacent to the SI joint. Given history of prostate cancer, differential considerations would include osteoblastic metastasis or possibly bone island. Showed only diverticular disease.  He was referred to GI and Dr. Ardis Hughs notes a colonoscopy performed 04/23/2017 makes the possibility of colon cancer unlikely.  Further work-up was planned, but on 09/19/2018 the patient again presented to the emergency room with chest pain, now in a different area, and a staging CT scan of the chest showed filling defects in the right lower lobe pulmonary arteries consistent with embolism.  There was a small pericardial effusion and an additional 2.4 cm left thyroid mass.  Aortic atherosclerosis was noted  He was started  on intravenous heparin and on 09/20/2018 underwent upper endoscopy under Dr. Carlean Purl showing a giant nonbleeding cratered gastric ulcer in the posterior wall of  the stomach.  Biopsies of this procedure showed (SZA 20-187) adenocarcinoma.    At that point we were consulted and we obtained tumor markers which included a CEA of 1318.2, a CA 19-9 of 67,479, a normal AFP and a normal PSA.  I discussed the case with pathology and they do not feel that additional testing would be helpful in terms of discriminating between a primary gastric and a primary biliary/pancreatic tumor.  The patient's subsequent history is as detailed below.   PAST MEDICAL HISTORY: Past Medical History:  Diagnosis Date   Arthritis    Diverticulosis of colon    Family history of breast cancer    Family history of prostate cancer    HTN (hypertension)    Hx of colonic polyp    Pneumonia    as a child/baby   Poor circulation of extremity    right leg   Prostate cancer ([facility]) 2015   prostate cancer - radiation treated     PAST SURGICAL HISTORY: Past Surgical History:  Procedure Laterality Date   25 GAUGE PARS PLANA VITRECTOMY WITH 20 GAUGE MVR PORT FOR MACULAR HOLE Right 09/19/2016   Procedure: 25 GAUGE PARS PLANA VITRECTOMY WITH 20 GAUGE MVR PORT FOR MACULAR HOLE, MEMBRANE PEEL, SERUM PATCH, GAS FLUID EXCHANGE, HEADSCOPE LASER;  Surgeon: [name], MD;  Location: [facility];  Service: Ophthalmology;  Laterality: Right;   BIOPSY  09/20/2018   Procedure: BIOPSY;  Surgeon: Gatha Mayer, MD;  Location: [facility];  Service: Endoscopy;;   CATARACT EXTRACTION  08/27/12   Right   COLONOSCOPY     ESOPHAGOGASTRODUODENOSCOPY (EGD) WITH PROPOFOL N/A 09/20/2018   Procedure: ESOPHAGOGASTRODUODENOSCOPY (EGD) WITH PROPOFOL;  Surgeon: Gatha Mayer, MD;  Location: [facility];  Service: Endoscopy;  Laterality: N/A;   HAND SURGERY  2016   right thumb   IR IMAGING GUIDED PORT INSERTION  11/04/2018   KNEE SURGERY Left 1989     FAMILY HISTORY: Family History  Problem Relation Age of Onset   Prostate cancer Father        dx >50   Hypertension Mother     Ulcers Mother    Breast cancer Sister        dx 20's   Prostate cancer Brother        dx under 110, metasttic, cause of his death   Ulcers Brother    Ulcers Sister    Peter Wood's father died from heart complications with a pacemaker at age 39; Peter Wood's father also has prostate cancer. Patients' mother died from natural causes at age 74. The patient has 4 brothers and 3 sisters. One of [place] sisters had breast cancer in her 52s and a brother had prostate cancer. Patient denies anyone in her family having ovarian, or pancreatic cancer. Kagen mentions that his youngest sister and his brother with prostate cancer also had stomach ulcers.    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 His wife, [name], is a retired [employer] summer Radiographer, therapeutic. As of 09/2018, they have been married for 56 years. They have 4 children, Peter Wood Billings, Peter Wood, Peter Wood, and Peter Wood. Peter Wood Billings lives in [place] and works at the [employer]. Peter Wood lives in [place] and works at [employer] and as a part Horticulturist, commercial. 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 Peter Wood Low lives in  [place] and works at [employer]. Daemyn has Wood grandchildren and 1 step great grandchild. He attends [employer].     ADVANCED DIRECTIVES: His wife, [name], is automatically his healthcare power of attorney.     HEALTH MAINTENANCE: Social History   Tobacco Use   Smoking status: Former Smoker    Years: 4.00    Last attempt to quit: 09/12/1967    Years since quitting: 51.4   Smokeless tobacco: Never Used  Substance Use Topics   Alcohol use: No   Drug use: No    Colonoscopy: 2018/[facility]  PSA: 0.54 on 09/18/2018  No Known Allergies  Current Outpatient Medications  Medication Sig Dispense Refill   amLODipine (NORVASC) 5 MG tablet TAKE ONE TABLET BY MOUTH ONE TIME DAILY  90 tablet 2   apixaban (ELIQUIS) 2.5 MG TABS tablet Take 1 tablet (2.5 mg total) by mouth 2 (two) times  daily. 60 tablet 12   benazepril (LOTENSIN) 40 MG tablet TAKE ONE TABLET BY MOUTH ONE TIME DAILY  90 tablet 2   capecitabine (XELODA) 500 MG tablet Take 2 tablets (1,000 mg total) by mouth 2 (two) times daily after a meal. Take on days 1-14 of chemotherapy. Repeat every 21 days. 56 tablet 4   Cholecalciferol 1000 UNITS tablet Take 1,000 Units by mouth daily.      latanoprost (XALATAN) 0.005 % ophthalmic solution Place 1 drop into both eyes at bedtime.      lidocaine-prilocaine (EMLA) cream Apply to affected area once 30 g 3   ondansetron (ZOFRAN) 4 MG tablet Take 1 tablet (4 mg total) by mouth every 8 (eight) hours as needed for nausea or vomiting. 20 tablet 1   pantoprazole (PROTONIX) 40 MG tablet Take 1 tablet (40 mg total) by mouth 2 (two) times daily before a meal. 60 tablet 0   prochlorperazine (COMPAZINE) 10 MG tablet Take 1 tablet (10 mg total) by mouth every 6 (six) hours as needed (Nausea or vomiting). 30 tablet 1   No current facility-administered medications for this visit.    Facility-Administered Medications Ordered in Other Visits  Medication Dose Route Frequency Provider Last Rate Last Dose   sodium chloride flush (NS) 0.9 % injection 10 mL  10 mL Intravenous PRN Melvin Marmo, Virgie Dad, MD         OBJECTIVE: Peter Wood in no acute distress Vitals:   01/28/19 1055  BP: (!) 131/58  Pulse: 67  Resp: 18  SpO2: 100%     Body mass index is 23.8 kg/m.   Wt Readings from Last 3 Encounters:  01/28/19 140 lb 12.8 oz (63.9 kg)  01/07/19 141 lb 3.2 oz (64 kg)  12/18/18 137 lb 1.6 oz (62.2 kg)  ECOG FS:1 - Symptomatic but completely ambulatory  Sclerae unicteric, EOMs intact Wearing a mask No cervical or supraclavicular adenopathy Lungs no rales or rhonchi Heart regular rate and rhythm Abd soft, nontender, positive bowel sounds MSK no focal spinal tenderness, no upper extremity lymphedema Neuro: nonfocal, well oriented, appropriate affect   LAB  RESULTS:  CMP     Component Value Date/Time   NA 140 01/07/2019 1010   K 3.8 01/07/2019 1010   CL 104 01/07/2019 1010   CO2 28 01/07/2019 1010   GLUCOSE 93 01/07/2019 1010   GLUCOSE 105 (H) 09/13/2006 0900   BUN 16 01/07/2019 1010   CREATININE 0.95 01/07/2019 1010   CREATININE 1.02 10/16/2018 0936   CALCIUM 8.0 (L) 01/07/2019 1010   PROT 6.2 (L) 01/07/2019  1010   ALBUMIN 3.3 (L) 01/07/2019 1010   AST 30 01/07/2019 1010   AST 76 (H) 10/16/2018 0936   ALT 27 01/07/2019 1010   ALT 47 (H) 10/16/2018 0936   ALKPHOS 74 01/07/2019 1010   BILITOT 0.6 01/07/2019 1010   BILITOT 0.Wood 10/16/2018 0936   GFRNONAA >60 01/07/2019 1010   GFRNONAA >60 10/16/2018 0936   GFRAA >60 01/07/2019 1010   GFRAA >60 10/16/2018 0936    No results found for: TOTALPROTELP, ALBUMINELP, A1GS, A2GS, BETS, BETA2SER, GAMS, MSPIKE, SPEI  No results found for: KPAFRELGTCHN, LAMBDASER, KAPLAMBRATIO  Lab Results  Component Value Date   WBC 3.4 (L) 01/07/2019   NEUTROABS 1.Wood 01/07/2019   HGB 9.6 (L) 01/07/2019   HCT 30.0 (L) 01/07/2019   MCV 99.0 01/07/2019   PLT 185 01/07/2019    _0 @  No results found for: LABCA2  No components found for: GEXBMW413  No results for input(s): INR in the last 168 hours.  No results found for: LABCA2  Lab Results  Component Value Date   KGM010 272 (H) 01/07/2019    No results found for: ZDG644  No results found for: IHK742  No results found for: CA2729  No components found for: HGQUANT  Lab Results  Component Value Date   CEA1 61.52 (H) 01/07/2019   /  CEA ([facility])  Date Value Ref Range Status  01/07/2019 61.52 (H) 0.00 - 5.00 ng/mL Final    Comment:    (NOTE) This test was performed using Architect's Chemiluminescent Microparticle Immunoassay. Values obtained from different assay methods cannot be used interchangeably. Please note that 5-10% of patients who smoke may see CEA levels up to 6.9 ng/mL. Performed at [facility], [place] [address], [place], [place] [address]      No results found for: AFPTUMOR  No results found for: Yznaga  No results found for: PSA1  No visits with results within 3 Day(s) from this visit.  Latest known visit with results is:  Appointment on 01/07/2019  Component Date Value Ref Range Status   CEA ([facility]) 01/07/2019 61.52* 0.00 - 5.00 ng/mL Final   Comment: (NOTE) This test was performed using Architect's Chemiluminescent Microparticle Immunoassay. Values obtained from different assay methods cannot be used interchangeably. Please note that 5-10% of patients who smoke may see CEA levels up to 6.9 ng/mL. Performed at [facility], [place] [address] [place], [place] [address]    CA 19-9 01/07/2019 416* 0 - 35 U/mL Final   Comment: (NOTE) Roche Diagnostics Electrochemiluminescence Immunoassay (ECLIA) Values obtained with different assay methods or kits cannot be used interchangeably.  Results cannot be interpreted as absolute evidence of the presence or absence of malignant disease. Performed At: [facility] [place], [place] [id number] Rush Farmer MD [id number]    Sodium 01/07/2019 140  135 - 145 mmol/L Final   Potassium 01/07/2019 3.8  3.5 - 5.1 mmol/L Final   Chloride 01/07/2019 104  98 - 111 mmol/L Final   CO2 01/07/2019 28  22 - 32 mmol/L Final   Glucose, Bld 01/07/2019 93  70 - 99 mg/dL Final   BUN 01/07/2019 16  8 - 23 mg/dL Final   Creatinine, Ser 01/07/2019 0.95  0.61 - 1.24 mg/dL Final   Calcium 01/07/2019 8.0* 8.9 - 10.3 mg/dL Final   Total Protein 01/07/2019 6.2* 6.5 - 8.1 g/dL Final   Albumin 01/07/2019 3.3* 3.5 - 5.0 g/dL Final   AST 01/07/2019  30  15 - 41 U/L Final   ALT 01/07/2019 27  0 - 44 U/L Final   Alkaline Phosphatase 01/07/2019 74  38 - 126 U/L Final   Total Bilirubin 01/07/2019 0.6  0.3 - 1.2 mg/dL Final   GFR calc non Af Amer 01/07/2019  >60  >60 mL/min Final   GFR calc Af Amer 01/07/2019 >60  >60 mL/min Final   Anion gap 01/07/2019 8  5 - 15 Final   Performed at [facility], [place] [address] [place], [place] [address]   WBC 01/07/2019 3.4* 4.0 - 10.5 K/uL Final   RBC 01/07/2019 3.03* 4.22 - 5.81 MIL/uL Final   Hemoglobin 01/07/2019 9.6* 13.0 - 17.0 g/dL Final   HCT 01/07/2019 30.0* 39.0 - 52.0 % Final   MCV 01/07/2019 99.0  80.0 - 100.0 fL Final   MCH 01/07/2019 31.Wood  26.0 - 34.0 pg Final   MCHC 01/07/2019 32.0  30.0 - 36.0 g/dL Final   RDW 01/07/2019 20.2* 11.5 - 15.5 % Final   Platelets 01/07/2019 185  150 - 400 K/uL Final   nRBC 01/07/2019 0.0  0.0 - 0.2 % Final   Neutrophils Relative % 01/07/2019 51  % Final   Neutro Abs 01/07/2019 1.Wood  1.Wood - Wood.Wood K/uL Final   Lymphocytes Relative 01/07/2019 30  % Final   Lymphs Abs 01/07/2019 1.0  0.Wood - 4.0 K/uL Final   Monocytes Relative 01/07/2019 16  % Final   Monocytes Absolute 01/07/2019 0.6  0.1 - 1.0 K/uL Final   Eosinophils Relative 01/07/2019 2  % Final   Eosinophils Absolute 01/07/2019 0.1  0.0 - 0.5 K/uL Final   Basophils Relative 01/07/2019 1  % Final   Basophils Absolute 01/07/2019 0.0  0.0 - 0.1 K/uL Final   Immature Granulocytes 01/07/2019 0  % Final   Abs Immature Granulocytes 01/07/2019 0.01  0.00 - 0.07 K/uL Final   Performed at [facility], [place] [address], [place], [place] [address]    (this displays the last labs from the last 3 days)  No results found for: TOTALPROTELP, ALBUMINELP, A1GS, A2GS, BETS, BETA2SER, GAMS, MSPIKE, SPEI (this displays SPEP labs)  No results found for: KPAFRELGTCHN, LAMBDASER, KAPLAMBRATIO (kappa/lambda light chains)  No results found for: HGBA, HGBA2QUANT, HGBFQUANT, HGBSQUAN (Hemoglobinopathy evaluation)   No results found for: LDH  Lab Results  Component Value Date   IRON 38 (L) 09/21/2018   TIBC 251 09/21/2018   IRONPCTSAT 15 (L) 09/21/2018    (Iron and TIBC)  Lab Results  Component Value Date   FERRITIN 138 09/21/2018    Urinalysis    Component Value Date/Time   COLORURINE YELLOW 09/22/2018 [place] 09/22/2018 0541   LABSPEC 1.014 09/22/2018 [place] 6.0 09/22/2018 0541   GLUCOSEU NEGATIVE 09/22/2018 0541   GLUCOSEU NEGATIVE 07/20/2017 [place] 09/22/2018 0541   BILIRUBINUR NEGATIVE 09/22/2018 0541   KETONESUR 5 (A) 09/22/2018 0541   PROTEINUR NEGATIVE 09/22/2018 0541   UROBILINOGEN 0.2 07/20/2017 1056   NITRITE NEGATIVE 09/22/2018 0541   LEUKOCYTESUR NEGATIVE 09/22/2018 0541     STUDIES:  No results found.   ELIGIBLE FOR AVAILABLE RESEARCH PROTOCOL: no   ASSESSMENT: 84 y.o. Peter Wood, [place] Wood status post gastric biopsy 09/20/2018 showing adenocarcinoma, with a CA 19-9 greater than 65,000; staging studies showing an apparent mass adjacent to the head of the pancreas, innumerable liver lesions, upper abdominal mesenteric and peripancreatic lymphadenopathy, and an isolated sclerotic  density in the left iliac bone  (a) genomics requested on gastric biopsy showed the tumor to be HER-2 amplified at 3+, PD-L1 positive, and T p53 mutated.  MSI is stable, mismatch repair status is proficient and the tumor mutational burden is intermediate.  (1) chest CT scan 09/19/2018 shows right lower lobe pulmonary embolism  (a) on intravenous heparin started 09/19/2018, transitioned to apixaban 10/01/2018  (2) genetics testing 10/04/2018: negative  (a) family history of prostate and early breast cancer; patient has a history of prostate cancer  (3) started gemcitabine/Abraxane 10/09/2018, repeated days 1, 8 and 15 of each 28-day cycle  (a) stopped after 1 cycle (3 doses) with reassessment of diagnosis based on CARIS results, noted above  (4) to start oxaliplatin, capecitabine and trastuzumab 11/06/2018  (a) baseline echocardiogram 09/20/2018 shows an ejection fraction in the 55-60%  range  (b) oxaliplatin and trastuzumab will be given every 21 days  (c) capecitabine initial dose will be 1000 mg twice daily for 14 days of every 21-day cycle   PLAN: Rayland is tolerating his CAPOX/ trastuzumab treatment remarkably well, with very stable clinical course.  His tumor markers have decreased markedly.  We have a repeat today of course.  His labs are pending at this point  We are continuing with the current cycle.  He started his capecitabine this morning and will continue for 14 days.  He is scheduled for staging CT scans 02/11/2019.  He will see me again on 02/18/2019.  At that time we will make a preliminary determination on how long to continue his treatments before switching to Herceptin alone.  His last echocardiogram was in January and that of course will need to be repeated.  He knows to call for any other problems that may develop before the next visit.  Virgie Dad. Magrinat, MD 01/28/19 11:13 AM Medical Oncology and Hematology [facility] [address] [place], [place] [address] Tel. [phone]    Fax. [phone]    I, Wilburn Mylar, am acting as scribe for Dr. Virgie Dad. Magrinat.  I, Lurline Del MD, have reviewed the above documentation for accuracy and completeness, and I agree with the above.

## 2019-01-28 ENCOUNTER — Inpatient Hospital Stay (HOSPITAL_BASED_OUTPATIENT_CLINIC_OR_DEPARTMENT_OTHER): Payer: Medicare Other | Admitting: Oncology

## 2019-01-28 ENCOUNTER — Inpatient Hospital Stay: Payer: Medicare Other | Admitting: Nutrition

## 2019-01-28 ENCOUNTER — Telehealth: Payer: Self-pay | Admitting: *Deleted

## 2019-01-28 ENCOUNTER — Inpatient Hospital Stay: Payer: Medicare Other

## 2019-01-28 ENCOUNTER — Other Ambulatory Visit: Payer: Self-pay

## 2019-01-28 ENCOUNTER — Inpatient Hospital Stay: Payer: Medicare Other | Attending: Oncology

## 2019-01-28 VITALS — BP 131/58 | HR 67 | Resp 18 | Ht 64.5 in | Wt 140.8 lb

## 2019-01-28 DIAGNOSIS — Z5111 Encounter for antineoplastic chemotherapy: Secondary | ICD-10-CM | POA: Insufficient documentation

## 2019-01-28 DIAGNOSIS — C61 Malignant neoplasm of prostate: Secondary | ICD-10-CM

## 2019-01-28 DIAGNOSIS — Z79899 Other long term (current) drug therapy: Secondary | ICD-10-CM | POA: Diagnosis not present

## 2019-01-28 DIAGNOSIS — C163 Malignant neoplasm of pyloric antrum: Secondary | ICD-10-CM

## 2019-01-28 DIAGNOSIS — Z7901 Long term (current) use of anticoagulants: Secondary | ICD-10-CM

## 2019-01-28 DIAGNOSIS — C169 Malignant neoplasm of stomach, unspecified: Secondary | ICD-10-CM

## 2019-01-28 DIAGNOSIS — C787 Secondary malignant neoplasm of liver and intrahepatic bile duct: Secondary | ICD-10-CM

## 2019-01-28 DIAGNOSIS — C168 Malignant neoplasm of overlapping sites of stomach: Secondary | ICD-10-CM

## 2019-01-28 DIAGNOSIS — Z8546 Personal history of malignant neoplasm of prostate: Secondary | ICD-10-CM | POA: Diagnosis not present

## 2019-01-28 DIAGNOSIS — Z7689 Persons encountering health services in other specified circumstances: Secondary | ICD-10-CM | POA: Diagnosis not present

## 2019-01-28 DIAGNOSIS — Z86711 Personal history of pulmonary embolism: Secondary | ICD-10-CM | POA: Diagnosis not present

## 2019-01-28 DIAGNOSIS — D649 Anemia, unspecified: Secondary | ICD-10-CM

## 2019-01-28 DIAGNOSIS — C799 Secondary malignant neoplasm of unspecified site: Secondary | ICD-10-CM

## 2019-01-28 DIAGNOSIS — R978 Other abnormal tumor markers: Secondary | ICD-10-CM | POA: Diagnosis not present

## 2019-01-28 DIAGNOSIS — Z87891 Personal history of nicotine dependence: Secondary | ICD-10-CM

## 2019-01-28 DIAGNOSIS — R590 Localized enlarged lymph nodes: Secondary | ICD-10-CM

## 2019-01-28 LAB — COMPREHENSIVE METABOLIC PANEL
ALT: 22 U/L (ref 0–44)
AST: 27 U/L (ref 15–41)
Albumin: 3.4 g/dL — ABNORMAL LOW (ref 3.5–5.0)
Alkaline Phosphatase: 75 U/L (ref 38–126)
Anion gap: 9 (ref 5–15)
BUN: 11 mg/dL (ref 8–23)
CO2: 28 mmol/L (ref 22–32)
Calcium: 8.6 mg/dL — ABNORMAL LOW (ref 8.9–10.3)
Chloride: 104 mmol/L (ref 98–111)
Creatinine, Ser: 0.9 mg/dL (ref 0.61–1.24)
GFR calc Af Amer: >60 mL/min (ref >60)
GFR calc non Af Amer: >60 mL/min (ref >60)
Glucose, Bld: 103 mg/dL — ABNORMAL HIGH (ref 70–99)
Potassium: 3.3 mmol/L — ABNORMAL LOW (ref 3.5–5.1)
Sodium: 141 mmol/L (ref 135–145)
Total Bilirubin: 0.7 mg/dL (ref 0.3–1.2)
Total Protein: 6.4 g/dL — ABNORMAL LOW (ref 6.5–8.1)

## 2019-01-28 LAB — CBC WITH DIFFERENTIAL/PLATELET
Abs Immature Granulocytes: 0.01 10*3/uL (ref 0.00–0.07)
Basophils Absolute: 0 10*3/uL (ref 0.0–0.1)
Basophils Relative: 1 %
Eosinophils Absolute: 0.1 10*3/uL (ref 0.0–0.5)
Eosinophils Relative: 3 %
HCT: 30.4 % — ABNORMAL LOW (ref 39.0–52.0)
Hemoglobin: 9.8 g/dL — ABNORMAL LOW (ref 13.0–17.0)
Immature Granulocytes: 0 %
Lymphocytes Relative: 40 %
Lymphs Abs: 1.3 10*3/uL (ref 0.7–4.0)
MCH: 32.2 pg (ref 26.0–34.0)
MCHC: 32.2 g/dL (ref 30.0–36.0)
MCV: 100 fL (ref 80.0–100.0)
Monocytes Absolute: 0.5 10*3/uL (ref 0.1–1.0)
Monocytes Relative: 14 %
Neutro Abs: 1.3 10*3/uL — ABNORMAL LOW (ref 1.7–7.7)
Neutrophils Relative %: 42 %
Platelets: 206 10*3/uL (ref 150–400)
RBC: 3.04 MIL/uL — ABNORMAL LOW (ref 4.22–5.81)
RDW: 21.2 % — ABNORMAL HIGH (ref 11.5–15.5)
WBC: 3.2 10*3/uL — ABNORMAL LOW (ref 4.0–10.5)
nRBC: 0 % (ref 0.0–0.2)

## 2019-01-28 LAB — CEA (IN HOUSE-CHCC): CEA (CHCC-In House): 30.78 ng/mL — ABNORMAL HIGH (ref 0.00–5.00)

## 2019-01-28 MED ORDER — TRASTUZUMAB CHEMO 150 MG IV SOLR
6.0000 mg/kg | Freq: Once | INTRAVENOUS | Status: AC
  Administered 2019-01-28: 14:00:00 357 mg via INTRAVENOUS
  Filled 2019-01-28: qty 17

## 2019-01-28 MED ORDER — PALONOSETRON HCL INJECTION 0.25 MG/5ML
INTRAVENOUS | Status: AC
  Filled 2019-01-28: qty 5

## 2019-01-28 MED ORDER — SODIUM CHLORIDE 0.9 % IV SOLN
Freq: Once | INTRAVENOUS | Status: AC
  Administered 2019-01-28: 13:00:00 via INTRAVENOUS
  Filled 2019-01-28: qty 5

## 2019-01-28 MED ORDER — PALONOSETRON HCL INJECTION 0.25 MG/5ML
0.2500 mg | Freq: Once | INTRAVENOUS | Status: AC
  Administered 2019-01-28: 12:00:00 0.25 mg via INTRAVENOUS

## 2019-01-28 MED ORDER — DIPHENHYDRAMINE HCL 25 MG PO CAPS
25.0000 mg | ORAL_CAPSULE | Freq: Once | ORAL | Status: AC
  Administered 2019-01-28: 12:00:00 25 mg via ORAL

## 2019-01-28 MED ORDER — SODIUM CHLORIDE 0.9 % IV SOLN
Freq: Once | INTRAVENOUS | Status: AC
  Administered 2019-01-28: 12:00:00 via INTRAVENOUS
  Filled 2019-01-28: qty 250

## 2019-01-28 MED ORDER — DEXTROSE 5 % IV SOLN
Freq: Once | INTRAVENOUS | Status: AC
  Administered 2019-01-28: 14:00:00 via INTRAVENOUS
  Filled 2019-01-28: qty 250

## 2019-01-28 MED ORDER — PEGFILGRASTIM 6 MG/0.6ML ~~LOC~~ PSKT
PREFILLED_SYRINGE | SUBCUTANEOUS | Status: AC
  Filled 2019-01-28: qty 0.6

## 2019-01-28 MED ORDER — ACETAMINOPHEN 325 MG PO TABS
ORAL_TABLET | ORAL | Status: AC
  Filled 2019-01-28: qty 2

## 2019-01-28 MED ORDER — DIPHENHYDRAMINE HCL 25 MG PO CAPS
ORAL_CAPSULE | ORAL | Status: AC
  Filled 2019-01-28: qty 1

## 2019-01-28 MED ORDER — SODIUM CHLORIDE 0.9% FLUSH
10.0000 mL | INTRAVENOUS | Status: DC | PRN
  Administered 2019-01-28: 17:00:00 10 mL
  Filled 2019-01-28: qty 10

## 2019-01-28 MED ORDER — PEGFILGRASTIM 6 MG/0.6ML ~~LOC~~ PSKT
6.0000 mg | PREFILLED_SYRINGE | Freq: Once | SUBCUTANEOUS | Status: AC
  Administered 2019-01-28: 17:00:00 6 mg via SUBCUTANEOUS

## 2019-01-28 MED ORDER — HEPARIN SOD (PORK) LOCK FLUSH 100 UNIT/ML IV SOLN
500.0000 [IU] | Freq: Once | INTRAVENOUS | Status: AC | PRN
  Administered 2019-01-28: 17:00:00 500 [IU]
  Filled 2019-01-28: qty 5

## 2019-01-28 MED ORDER — ACETAMINOPHEN 325 MG PO TABS
650.0000 mg | ORAL_TABLET | Freq: Once | ORAL | Status: AC
  Administered 2019-01-28: 12:00:00 650 mg via ORAL

## 2019-01-28 MED ORDER — OXALIPLATIN CHEMO INJECTION 100 MG/20ML
85.0000 mg/m2 | Freq: Once | INTRAVENOUS | Status: AC
  Administered 2019-01-28: 14:00:00 140 mg via INTRAVENOUS
  Filled 2019-01-28: qty 10

## 2019-01-28 NOTE — Progress Notes (Signed)
RD working remotely.  Nutrition follow up completed with patient during infusion. Reports good appetite and weight is stable. Denies nutrition impact symptoms.  Weight 140.8 pounds May 19, increased from 137.1 April 8.  Nutrition Diagnosis: Weight loss improved.  Intervention: Continue strategies for increased intake.  Will follow patient as needed.

## 2019-01-28 NOTE — Addendum Note (Signed)
Addended by: Chauncey Cruel on: 01/28/2019 01:20 PM   Modules accepted: Orders

## 2019-01-28 NOTE — Telephone Encounter (Signed)
Per MD review of [place] of 1.3 - order given to proceed with treatment of herceptin and eloxatin with request to add neulasta ( or biosimular ) for support therapy and treatment plan continuity for best outcome.  This RN left message on Dee's VM as well as high priority staff message and a CHAT sent for communication for above.  Plan at present is to proceed with treatment- neulasta onpro to be added and this note will be forwarded to [facility] for further authorization.

## 2019-01-28 NOTE — Progress Notes (Signed)
Per Dr. Jana Hakim ok to treat today with ANC 1.3. Also patient should have echo next week as waiting for scheduling. Possible onpro today.

## 2019-01-28 NOTE — Patient Instructions (Addendum)
[place] Discharge Instructions for Patients Receiving Chemotherapy  Today you received the following chemotherapy agents Oxaliplatin (ELOXATIN) & Trastuzumab (HERCEPTIN); Neulasta  To help prevent nausea and vomiting after your treatment, we encourage you to take your nausea medication as prescribed.  If you develop nausea and vomiting that is not controlled by your nausea medication, call the clinic.   BELOW ARE SYMPTOMS THAT SHOULD BE REPORTED IMMEDIATELY:  *FEVER GREATER THAN 100.5 F  *CHILLS WITH OR WITHOUT FEVER  NAUSEA AND VOMITING THAT IS NOT CONTROLLED WITH YOUR NAUSEA MEDICATION  *UNUSUAL SHORTNESS OF BREATH  *UNUSUAL BRUISING OR BLEEDING  TENDERNESS IN MOUTH AND THROAT WITH OR WITHOUT PRESENCE OF ULCERS  *URINARY PROBLEMS  *BOWEL PROBLEMS  UNUSUAL RASH Items with * indicate a potential emergency and should be followed up as soon as possible.  Feel free to call the clinic should you have any questions or concerns. The clinic phone number is [phone].  Please show the [facility] at check-in to the Emergency Department and triage nurse.  Coronavirus (COVID-19) Are you at risk?  Are you at risk for the Coronavirus (COVID-19)?  To be considered HIGH RISK for Coronavirus (COVID-19), you have to meet the following criteria:  . Traveled to [place], [place], [place], [place] or [place]; or in the [place] to [place], [place], [place], or [place]; and have fever, cough, and shortness of breath within the last 2 weeks of travel OR . Been in close contact with a person diagnosed with COVID-19 within the last 2 weeks and have fever, cough, and shortness of breath . IF YOU DO NOT MEET THESE CRITERIA, YOU ARE CONSIDERED LOW RISK FOR COVID-19.  What to do if you are HIGH RISK for COVID-19?  Marland Kitchen If you are having a medical emergency, call 911. . Seek medical care right away. Before you go to a doctor's office, urgent care or emergency  department, call ahead and tell them about your recent travel, contact with someone diagnosed with COVID-19, and your symptoms. You should receive instructions from your physician's office regarding next steps of care.  . When you arrive at healthcare provider, tell the healthcare staff immediately you have returned from visiting [place], [place], [place], [place] or [place]; or traveled in the [place] to [place], [place], [place], or [place]; in the last two weeks or you have been in close contact with a person diagnosed with COVID-19 in the last 2 weeks.   . Tell the health care staff about your symptoms: fever, cough and shortness of breath. . After you have been seen by a medical provider, you will be either: o Tested for (COVID-19) and discharged home on quarantine except to seek medical care if symptoms worsen, and asked to  - Stay home and avoid contact with others until you get your results (4-5 days)  - Avoid travel on public transportation if possible (such as bus, train, or airplane) or o Sent to the Emergency Department by EMS for evaluation, COVID-19 testing, and possible admission depending on your condition and test results.  What to do if you are LOW RISK for COVID-19?  Reduce your risk of any infection by using the same precautions used for avoiding the common cold or flu:  Marland Kitchen Wash your hands often with soap and warm water for at least 20 seconds.  If soap and water are not readily available, use an alcohol-based hand sanitizer with at least 60% alcohol.  Marland Kitchen  If coughing or sneezing, cover your mouth and nose by coughing or sneezing into the elbow areas of your shirt or coat, into a tissue or into your sleeve (not your hands). . Avoid shaking hands with others and consider head nods or verbal greetings only. . Avoid touching your eyes, nose, or mouth with unwashed hands.  . Avoid close contact with people who are sick. . Avoid places or events with large numbers of people  in one location, like concerts or sporting events. . Carefully consider travel plans you have or are making. . If you are planning any travel outside or inside the [place], visit the CDC's Travelers' Health webpage for the latest health notices. . If you have some symptoms but not all symptoms, continue to monitor at home and seek medical attention if your symptoms worsen. . If you are having a medical emergency, call 911.   [place] / e-Visit: [email or web address]         [facility]: [facility]: [phone]                   [facility]: [phone]

## 2019-01-29 LAB — CANCER ANTIGEN 19-9: CA 19-9: 185 U/mL — ABNORMAL HIGH (ref 0–35)

## 2019-02-11 ENCOUNTER — Telehealth (HOSPITAL_COMMUNITY): Payer: Self-pay | Admitting: Cardiology

## 2019-02-11 ENCOUNTER — Encounter (HOSPITAL_COMMUNITY): Payer: Self-pay

## 2019-02-11 ENCOUNTER — Ambulatory Visit (HOSPITAL_COMMUNITY)
Admission: RE | Admit: 2019-02-11 | Discharge: 2019-02-11 | Disposition: A | Discharge status: Home / Self Care | Payer: Medicare Other | Source: Ambulatory Visit | Attending: Oncology | Admitting: Oncology

## 2019-02-11 ENCOUNTER — Other Ambulatory Visit: Payer: Self-pay

## 2019-02-11 DIAGNOSIS — C169 Malignant neoplasm of stomach, unspecified: Secondary | ICD-10-CM | POA: Diagnosis present

## 2019-02-11 DIAGNOSIS — C164 Malignant neoplasm of pylorus: Secondary | ICD-10-CM | POA: Insufficient documentation

## 2019-02-11 DIAGNOSIS — C787 Secondary malignant neoplasm of liver and intrahepatic bile duct: Secondary | ICD-10-CM | POA: Diagnosis present

## 2019-02-11 DIAGNOSIS — C799 Secondary malignant neoplasm of unspecified site: Secondary | ICD-10-CM | POA: Insufficient documentation

## 2019-02-11 DIAGNOSIS — C61 Malignant neoplasm of prostate: Secondary | ICD-10-CM

## 2019-02-11 MED ORDER — IOHEXOL 300 MG/ML  SOLN
100.0000 mL | Freq: Once | INTRAMUSCULAR | Status: AC | PRN
  Administered 2019-02-11: 100 mL via INTRAVENOUS

## 2019-02-11 MED ORDER — HEPARIN SOD (PORK) LOCK FLUSH 100 UNIT/ML IV SOLN: 500.0000 [IU] | Freq: Once | INTRAVENOUS | Status: DC

## 2019-02-11 MED ORDER — HEPARIN SOD (PORK) LOCK FLUSH 100 UNIT/ML IV SOLN
INTRAVENOUS | Status: AC
  Administered 2019-02-11: 500 [IU]
  Filled 2019-02-11: qty 5

## 2019-02-11 MED ORDER — SODIUM CHLORIDE (PF) 0.9 % IJ SOLN
INTRAMUSCULAR | Status: AC
  Filled 2019-02-11: qty 50

## 2019-02-11 NOTE — Telephone Encounter (Signed)

## 2019-02-12 ENCOUNTER — Other Ambulatory Visit: Payer: Self-pay

## 2019-02-12 ENCOUNTER — Other Ambulatory Visit: Payer: Self-pay | Admitting: Oncology

## 2019-02-12 ENCOUNTER — Ambulatory Visit (HOSPITAL_COMMUNITY): Payer: Medicare Other | Attending: Cardiology

## 2019-02-12 DIAGNOSIS — C787 Secondary malignant neoplasm of liver and intrahepatic bile duct: Secondary | ICD-10-CM | POA: Insufficient documentation

## 2019-02-12 DIAGNOSIS — C163 Malignant neoplasm of pyloric antrum: Secondary | ICD-10-CM

## 2019-02-12 DIAGNOSIS — C169 Malignant neoplasm of stomach, unspecified: Secondary | ICD-10-CM

## 2019-02-12 DIAGNOSIS — C799 Secondary malignant neoplasm of unspecified site: Secondary | ICD-10-CM

## 2019-02-12 DIAGNOSIS — I10 Essential (primary) hypertension: Secondary | ICD-10-CM | POA: Diagnosis not present

## 2019-02-12 DIAGNOSIS — C61 Malignant neoplasm of prostate: Secondary | ICD-10-CM | POA: Diagnosis not present

## 2019-02-12 DIAGNOSIS — C168 Malignant neoplasm of overlapping sites of stomach: Secondary | ICD-10-CM

## 2019-02-13 MED FILL — CAPECITABINE 500 MG TABS: 500 | 21 days supply | Qty: 56 | Fill #0

## 2019-02-17 NOTE — Progress Notes (Signed)
Peter Wood  [phone] [phone]     ID: Peter Wood DOB: 1935-05-02  MR#: [id number]  PQD#:[id number]  Patient Care Team: Cassandria Anger, MD as PCP - General Milus Banister, MD as Attending Physician (Gastroenterology) Magrinat, Virgie Dad, MD as Consulting Physician (Oncology) Gatha Mayer, MD as Consulting Physician (Gastroenterology) [name], MD as Consulting Physician (Ophthalmology) Ramon Dredge, MD as Referring Physician (Radiation Oncology) Larey Dresser, MD as Consulting Physician (Cardiology) OTHER MD:   CHIEF COMPLAINT: Metastatic gastric adenocarcinoma  CURRENT TREATMENT: oxaliplatin, capecitabine, trastuzumab   INTERVAL HISTORY: Peter Wood is seen today for follow-up and treatment of his metastatic gastric adenocarcinoma.   He is being treated with oxaliplatin, capecitabine and trastuzumab. Today is day 1 cycle 6. He tolerates this well with no noticeable side effects and he started his capecitabine this morning.  He underwent repeat echocardiogram on 02/12/2019. This showed an ejection fraction of 60-65%, which is improved from 55-60% in 09/2018.  However when he came today he was found to be febrile.  He was not aware that he had a fever.  Accordingly we are holding his treatment while doing a fever work-up and initiating antibiotics.  Since his last visit here, he underwent abdomen/pelvis CT on 02/11/2019. This showed: marked reduction in size of hepatic metastasis, no new metastatic lesions; thickened gastric cardia and antrum of the stomach, no measurable mass lesion; resolution of previously-seen bulky periportal adenopathy; no new adenopathy.  We are following his tumor marker: Lab Results  Component Value Date   CAN199 185 (H) 01/28/2019   NMM768 416 (H) 01/07/2019   GSU110 976 (H) 12/18/2018   RPR945 7,694 (H) 11/13/2018   OPF292 75,452 (H) 10/23/2018   His CEA also has decreased: Lab  Results  Component Value Date   CEA1 15.24 (H) 02/18/2019   CEA1 30.78 (H) 01/28/2019   CEA1 61.52 (H) 01/07/2019   CEA1 1,237.70 (H) 11/06/2018    REVIEW OF SYSTEMS: Peter Wood has been doing well.  He denies any cough shortness of breath or pleurisy, headaches, change in smell acuity, or diarrhea.  There has been no rash.  His wife also is in her usual state of health.  A detailed review of systems was otherwise noncontributory  HISTORY OF CURRENT ILLNESS: From the original intake note:  Peter Wood presented to the emergency room 09/07/2018 with mid/upper abdominal pain.  He reported weight loss of 25 pounds over the past 6 months and significant anorexia.  His liver function tests were abnormal and a right upper quadrant ultrasound was obtained.  This was read as concerning for malignancy and a CT of the abdomen and pelvis with contrast was obtained the same day, showing  1. Too numerous to count hypodense masses of the liver concerning for hepatic metastasis. In the region of the porta hepatis, there are hypodense lesions more likely associated with the liver though the pancreatic head is partially obscured by a 3.2 cm hypodense mass. Findings are more likely associated with the liver and less likely an isolated pancreatic mass. 2. Upper abdominal mesenteric and peripancreatic lymphadenopathy suspicious for metastatic disease. 3. 6 mm sclerotic density in the left iliac bone adjacent to the SI joint. Given history of prostate cancer, differential considerations would include osteoblastic metastasis or possibly bone island. Showed only diverticular disease.  He was referred to GI and Dr. Ardis Hughs notes a colonoscopy performed 04/23/2017 makes the possibility of colon cancer unlikely.  Further work-up was planned,  but on 09/19/2018 the patient again presented to the emergency room with chest pain, now in a different area, and a staging CT scan of the chest showed filling defects in the right  lower lobe pulmonary arteries consistent with embolism.  There was a small pericardial effusion and an additional 2.4 cm left thyroid mass.  Aortic atherosclerosis was noted  He was started on intravenous heparin and on 09/20/2018 underwent upper endoscopy under Dr. Carlean Purl showing a giant nonbleeding cratered gastric ulcer in the posterior wall of the stomach.  Biopsies of this procedure showed (SZA 20-187) adenocarcinoma.   At that point we were consulted and we obtained tumor markers which included a CEA of 1318.2, a CA 19-9 of 67,479, a normal AFP and a normal PSA.  I discussed the case with pathology and they do not feel that additional testing would be helpful in terms of discriminating between a primary gastric and a primary biliary/pancreatic tumor.  The patient's subsequent history is as detailed below.   PAST MEDICAL HISTORY: Past Medical History:  Diagnosis Date   Arthritis    Diverticulosis of colon    Family history of breast cancer    Family history of prostate cancer    HTN (hypertension)    Hx of colonic polyp    Pneumonia    as a child/baby   Poor circulation of extremity    right leg   Prostate cancer ([place]) 2015   prostate cancer - radiation treated     PAST SURGICAL HISTORY: Past Surgical History:  Procedure Laterality Date   25 GAUGE PARS PLANA VITRECTOMY WITH 20 GAUGE MVR PORT FOR MACULAR HOLE Right 09/19/2016   Procedure: 25 GAUGE PARS PLANA VITRECTOMY WITH 20 GAUGE MVR PORT FOR MACULAR HOLE, MEMBRANE PEEL, SERUM PATCH, GAS FLUID EXCHANGE, HEADSCOPE LASER;  Surgeon: [name], MD;  Location: [facility];  Service: Ophthalmology;  Laterality: Right;   BIOPSY  09/20/2018   Procedure: BIOPSY;  Surgeon: Gatha Mayer, MD;  Location: [facility];  Service: Endoscopy;;   CATARACT EXTRACTION  08/27/12   Right   COLONOSCOPY     ESOPHAGOGASTRODUODENOSCOPY (EGD) WITH PROPOFOL N/A 09/20/2018   Procedure: ESOPHAGOGASTRODUODENOSCOPY (EGD) WITH PROPOFOL;   Surgeon: Gatha Mayer, MD;  Location: [facility];  Service: Endoscopy;  Laterality: N/A;   HAND SURGERY  2016   right thumb   IR IMAGING GUIDED PORT INSERTION  11/04/2018   KNEE SURGERY Left 1989     FAMILY HISTORY: Family History  Problem Relation Age of Onset   Prostate cancer Father        dx >50   Hypertension Mother    Ulcers Mother    Breast cancer Sister        dx 20's   Prostate cancer Brother        dx under 5, metasttic, cause of his death   Ulcers Brother    Ulcers Sister    Peter Wood's father died from heart complications with a pacemaker at age 16; Peter Wood's father also has prostate cancer. Patients' mother died from natural causes at age 83. The patient has 4 brothers and 3 sisters. One of [place] sisters had breast cancer in her 68s and a brother had prostate cancer. Patient denies anyone in her family having ovarian, or pancreatic cancer. Tyquavious mentions that his youngest sister and his brother with prostate cancer also had stomach ulcers.    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 His wife, [name], is a retired [employer] summer session  Garment/textile technologist. As of 09/2018, they have been married for 56 years. They have 4 children, Peter Wood, Peter Wood, Peter Wood, and Peter Wood. Peter Wood lives in [place] and works at the [employer]. Peter Wood lives in [place] and works at [employer] and as a part Horticulturist, commercial. 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 Peter Wood Low lives in [place] and works at [employer]. Tameron has 7 grandchildren and 1 step great grandchild. He attends [employer].     ADVANCED DIRECTIVES: His wife, [name], is automatically his healthcare power of attorney.     HEALTH MAINTENANCE: Social History   Tobacco Use   Smoking status: Former Smoker    Years: 4.00    Last attempt to quit: 09/12/1967    Years since quitting: 51.4   Smokeless tobacco: Never Used  Substance Use Topics   Alcohol use:  No   Drug use: No    Colonoscopy: 2018/[facility]  PSA: 0.54 on 09/18/2018  No Known Allergies  Current Outpatient Medications  Medication Sig Dispense Refill   amLODipine (NORVASC) 5 MG tablet TAKE ONE TABLET BY MOUTH ONE TIME DAILY  90 tablet 2   apixaban (ELIQUIS) 2.5 MG TABS tablet Take 1 tablet (2.5 mg total) by mouth 2 (two) times daily. 60 tablet 12   benazepril (LOTENSIN) 40 MG tablet TAKE ONE TABLET BY MOUTH ONE TIME DAILY  90 tablet 2   capecitabine (XELODA) 500 MG tablet TAKE 2 TABLETS (1,000 MG TOTAL) BY MOUTH 2 (TWO) TIMES DAILY AFTER A MEAL. TAKE ON DAYS 1-14 OF CHEMOTHERAPY. REPEAT EVERY 21 DAYS. 56 tablet 4   Cholecalciferol 1000 UNITS tablet Take 1,000 Units by mouth daily.      ciprofloxacin (CIPRO) 500 MG tablet Take 1 tablet (500 mg total) by mouth 2 (two) times daily. 14 tablet 1   latanoprost (XALATAN) 0.005 % ophthalmic solution Place 1 drop into both eyes at bedtime.      lidocaine-prilocaine (EMLA) cream Apply to affected area once 30 g 3   ondansetron (ZOFRAN) 4 MG tablet Take 1 tablet (4 mg total) by mouth every 8 (eight) hours as needed for nausea or vomiting. 20 tablet 1   pantoprazole (PROTONIX) 40 MG tablet Take 1 tablet (40 mg total) by mouth 2 (two) times daily before a meal. 60 tablet 0   prochlorperazine (COMPAZINE) 10 MG tablet Take 1 tablet (10 mg total) by mouth every 6 (six) hours as needed (Nausea or vomiting). 30 tablet 1   No current facility-administered medications for this visit.    Facility-Administered Medications Ordered in Other Visits  Medication Dose Route Frequency Provider Last Rate Last Dose   sodium chloride flush (NS) 0.9 % injection 10 mL  10 mL Intravenous PRN Magrinat, Virgie Dad, MD         OBJECTIVE: Elderly African-American man who appears well Vitals:   02/18/19 1028  BP: (!) 145/72  Pulse: 81  Resp: 17  Temp: 99.6 F (37.6 C)  SpO2: 100%     Body mass index is 24.03 kg/m.   Wt Readings from Last 3  Encounters:  02/18/19 142 lb 3.2 oz (64.5 kg)  01/28/19 140 lb 12.8 oz (63.9 kg)  01/07/19 141 lb 3.2 oz (64 kg)  ECOG FS:1 - Symptomatic but completely ambulatory  Sclerae unicteric, EOMs intact Wearing a mask No cervical or supraclavicular adenopathy Lungs no rales or rhonchi Heart regular rate and rhythm Abd soft, nontender, positive bowel sounds MSK no focal spinal tenderness, no  upper extremity lymphedema Neuro: nonfocal, well oriented, appropriate affect  LAB RESULTS:  CMP     Component Value Date/Time   NA 139 02/18/2019 1029   K 3.4 (L) 02/18/2019 1029   CL 103 02/18/2019 1029   CO2 28 02/18/2019 1029   GLUCOSE 100 (H) 02/18/2019 1029   GLUCOSE 105 (H) 09/13/2006 0900   BUN 13 02/18/2019 1029   CREATININE 0.98 02/18/2019 1029   CREATININE 1.02 10/16/2018 0936   CALCIUM 8.7 (L) 02/18/2019 1029   PROT 6.5 02/18/2019 1029   ALBUMIN 3.5 02/18/2019 1029   AST 26 02/18/2019 1029   AST 76 (H) 10/16/2018 0936   ALT 19 02/18/2019 1029   ALT 47 (H) 10/16/2018 0936   ALKPHOS 95 02/18/2019 1029   BILITOT 1.1 02/18/2019 1029   BILITOT 0.7 10/16/2018 0936   GFRNONAA >60 02/18/2019 1029   GFRNONAA >60 10/16/2018 0936   GFRAA >60 02/18/2019 1029   GFRAA >60 10/16/2018 0936    No results found for: TOTALPROTELP, ALBUMINELP, A1GS, A2GS, BETS, BETA2SER, GAMS, MSPIKE, SPEI  No results found for: KPAFRELGTCHN, LAMBDASER, KAPLAMBRATIO  Lab Results  Component Value Date   WBC 9.9 02/18/2019   NEUTROABS 7.0 02/18/2019   HGB 9.9 (L) 02/18/2019   HCT 30.4 (L) 02/18/2019   MCV 100.3 (H) 02/18/2019   PLT 250 02/18/2019    _0 @  No results found for: LABCA2  No components found for: PNTIRW431  No results for input(s): INR in the last 168 hours.  No results found for: LABCA2  Lab Results  Component Value Date   CAN199 185 (H) 01/28/2019    No results found for: VQM086  No results found for: PYP950  No results found for: CA2729  No components found  for: HGQUANT  Lab Results  Component Value Date   CEA1 15.24 (H) 02/18/2019   /  CEA ([facility])  Date Value Ref Range Status  02/18/2019 15.24 (H) 0.00 - 5.00 ng/mL Final    Comment:    (NOTE) This test was performed using Architect's Chemiluminescent Microparticle Immunoassay. Values obtained from different assay methods cannot be used interchangeably. Please note that 5-10% of patients who smoke may see CEA levels up to 6.9 ng/mL. Performed at [facility], [place] [address], [place], [place] [address]      No results found for: AFPTUMOR  No results found for: CHROMOGRNA  No results found for: PSA1  Appointment on 02/18/2019  Component Date Value Ref Range Status   CEA ([facility]) 02/18/2019 15.24* 0.00 - 5.00 ng/mL Final   Comment: (NOTE) This test was performed using Architect's Chemiluminescent Microparticle Immunoassay. Values obtained from different assay methods cannot be used interchangeably. Please note that 5-10% of patients who smoke may see CEA levels up to 6.9 ng/mL. Performed at [facility], [place] [address] [place], [place] [address]    Sodium 02/18/2019 139  135 - 145 mmol/L Final   Potassium 02/18/2019 3.4* 3.5 - 5.1 mmol/L Final   Chloride 02/18/2019 103  98 - 111 mmol/L Final   CO2 02/18/2019 28  22 - 32 mmol/L Final   Glucose, Bld 02/18/2019 100* 70 - 99 mg/dL Final   BUN 02/18/2019 13  8 - 23 mg/dL Final   Creatinine, Ser 02/18/2019 0.98  0.61 - 1.24 mg/dL Final   Calcium 02/18/2019 8.7* 8.9 - 10.3 mg/dL Final   Total Protein 02/18/2019 6.5  6.5 - 8.1 g/dL Final   Albumin 02/18/2019 3.5  3.5 -  5.0 g/dL Final   AST 02/18/2019 26  15 - 41 U/L Final   ALT 02/18/2019 19  0 - 44 U/L Final   Alkaline Phosphatase 02/18/2019 95  38 - 126 U/L Final   Total Bilirubin 02/18/2019 1.1  0.3 - 1.2 mg/dL Final   GFR calc non Af Amer 02/18/2019 >60  >60 mL/min Final   GFR calc Af Amer  02/18/2019 >60  >60 mL/min Final   Anion gap 02/18/2019 8  5 - 15 Final   Performed at [facility], [place] [address], [place], [place] [address]   WBC 02/18/2019 9.9  4.0 - 10.5 K/uL Final   RBC 02/18/2019 3.03* 4.22 - 5.81 MIL/uL Final   Hemoglobin 02/18/2019 9.9* 13.0 - 17.0 g/dL Final   HCT 02/18/2019 30.4* 39.0 - 52.0 % Final   MCV 02/18/2019 100.3* 80.0 - 100.0 fL Final   MCH 02/18/2019 32.7  26.0 - 34.0 pg Final   MCHC 02/18/2019 32.6  30.0 - 36.0 g/dL Final   RDW 02/18/2019 23.2* 11.5 - 15.5 % Final   Platelets 02/18/2019 250  150 - 400 K/uL Final   nRBC 02/18/2019 0.0  0.0 - 0.2 % Final   Neutrophils Relative % 02/18/2019 71  % Final   Neutro Abs 02/18/2019 7.0  1.7 - 7.7 K/uL Final   Lymphocytes Relative 02/18/2019 14  % Final   Lymphs Abs 02/18/2019 1.4  0.7 - 4.0 K/uL Final   Monocytes Relative 02/18/2019 14  % Final   Monocytes Absolute 02/18/2019 1.4* 0.1 - 1.0 K/uL Final   Eosinophils Relative 02/18/2019 0  % Final   Eosinophils Absolute 02/18/2019 0.0  0.0 - 0.5 K/uL Final   Basophils Relative 02/18/2019 0  % Final   Basophils Absolute 02/18/2019 0.0  0.0 - 0.1 K/uL Final   Immature Granulocytes 02/18/2019 1  % Final   Abs Immature Granulocytes 02/18/2019 0.10* 0.00 - 0.07 K/uL Final   Performed at [facility], [place] [address], [place], [place] [address]   Color, Urine 02/18/2019 YELLOW  YELLOW Final   APPearance 02/18/2019 CLEAR  CLEAR Final   Specific Gravity, Urine 02/18/2019 1.018  1.005 - 1.030 Final   pH 02/18/2019 6.0  5.0 - 8.0 Final   Glucose, UA 02/18/2019 NEGATIVE  NEGATIVE mg/dL Final   Hgb urine dipstick 02/18/2019 NEGATIVE  NEGATIVE Final   Bilirubin Urine 02/18/2019 NEGATIVE  NEGATIVE Final   Ketones, ur 02/18/2019 NEGATIVE  NEGATIVE mg/dL Final   Protein, ur 02/18/2019 30* NEGATIVE mg/dL Final   Nitrite 02/18/2019 NEGATIVE  NEGATIVE Final   Leukocytes,Ua 02/18/2019  NEGATIVE  NEGATIVE Final   RBC / HPF 02/18/2019 0-5  0 - 5 RBC/hpf Final   WBC, UA 02/18/2019 0-5  0 - 5 WBC/hpf Final   Bacteria, UA 02/18/2019 RARE* NONE SEEN Final   Mucus 02/18/2019 PRESENT   Final   Sperm, UA 02/18/2019 PRESENT   Final   Performed at [facility], [place] [address], [place], [place] [address]   Specimen Description 02/18/2019 BLOOD PORTA CATH   Final   Special Requests 02/18/2019    Final                   Value:BOTTLES DRAWN AEROBIC AND ANAEROBIC Blood Culture adequate volume Performed at [facility] [place] [address], [place], [place] [address]    Culture 02/18/2019 PENDING   Incomplete   Report Status 02/18/2019 PENDING   Incomplete    (  this displays the last labs from the last 3 days)  No results found for: TOTALPROTELP, ALBUMINELP, A1GS, A2GS, BETS, BETA2SER, GAMS, MSPIKE, SPEI (this displays SPEP labs)  No results found for: KPAFRELGTCHN, LAMBDASER, KAPLAMBRATIO (kappa/lambda light chains)  No results found for: HGBA, HGBA2QUANT, HGBFQUANT, HGBSQUAN (Hemoglobinopathy evaluation)   No results found for: LDH  Lab Results  Component Value Date   IRON 38 (L) 09/21/2018   TIBC 251 09/21/2018   IRONPCTSAT 15 (L) 09/21/2018   (Iron and TIBC)  Lab Results  Component Value Date   FERRITIN 138 09/21/2018    Urinalysis    Component Value Date/Time   COLORURINE YELLOW 02/18/2019 1056   APPEARANCEUR CLEAR 02/18/2019 1056   LABSPEC 1.018 02/18/2019 1056   PHURINE 6.0 02/18/2019 1056   GLUCOSEU NEGATIVE 02/18/2019 1056   GLUCOSEU NEGATIVE 07/20/2017 1056   HGBUR NEGATIVE 02/18/2019 [place] 02/18/2019 [place] 02/18/2019 1056   PROTEINUR 30 (A) 02/18/2019 1056   UROBILINOGEN 0.2 07/20/2017 1056   NITRITE NEGATIVE 02/18/2019 [place] 02/18/2019 1056     STUDIES:  Dg Chest 2 View  Result Date: 02/18/2019 CLINICAL DATA:  Patient with fever. EXAM: CHEST - 2 VIEW  COMPARISON:  Chest CT 09/19/2018 FINDINGS: Right anterior chest wall Port-A-Cath is present with tip projecting over the superior vena cava. Stable cardiac and mediastinal contours. Tortuosity of the thoracic aorta. No large area pulmonary consolidation. No pleural effusion or pneumothorax. Regional skeleton is unremarkable. IMPRESSION: No acute cardiopulmonary process. Electronically Signed   By: Lovey Newcomer M.D.   On: 02/18/2019 16:25   Ct Abdomen Pelvis W Contrast  Result Date: 02/11/2019 CLINICAL DATA:  Gastric adenocarcinoma. Liver metastasis. Chemotherapy ongoing EXAM: CT ABDOMEN AND PELVIS WITH CONTRAST TECHNIQUE: Multidetector CT imaging of the abdomen and pelvis was performed using the standard protocol following bolus administration of intravenous contrast. CONTRAST:  129m OMNIPAQUE IOHEXOL 300 MG/ML  SOLN COMPARISON:  09/07/2018 FINDINGS: Lower chest: Lung bases are clear. Hepatobiliary: Multiple low-density hepatic lesions are decreased in size compared to prior. New lesions are no longer visible. Example lesion measuring 10 mm in LEFT lateral hepatic lobe (image 13/2) compares to 30 mm. Example lesion in the superior RIGHT hepatic lobe measures 10 mm compared to 15 mm. Example lesion inferior RIGHT hepatic lobe measuring 9 mm (image 28/2) decreased from 24 mm. No new lesions identified. No biliary duct dilatation.  Gallbladder normal Pancreas: Pancreas is normal. No ductal dilatation. No pancreatic inflammation. Spleen: Normal spleen Adrenals/urinary tract: Thickening through the GE junction/gastric cardia. Thickening through through the gastric antrum. No discrete measurable mass lesion. Small bowel, appendix and cecum normal. Moderate volume stool throughout the colon. Stomach/Bowel: Stomach, small bowel, appendix, and cecum are normal. The colon and rectosigmoid colon are normal. Vascular/Lymphatic: Abdominal aorta is normal caliber. No periportal or retroperitoneal adenopathy. No pelvic  adenopathy. 11 mm periportal lymph node (image 16/2) decreased from 32 mm on prior. Reproductive: Fiducial markers in the prostate Other: No peritoneal metastasis Musculoskeletal: Is IMPRESSION: 1. Marked reduction in size of hepatic metastasis. No new metastatic lesions. 2. Thickened gastric cardia and antrum of the stomach. No measurable mass lesion. 3. Resolution of previous seen bulky periportal adenopathy. 4. No new adenopathy. Electronically Signed   By: SSuzy BouchardM.D.   On: 02/11/2019 14:13     ELIGIBLE FOR AVAILABLE RESEARCH PROTOCOL: no   ASSESSMENT: 84y.o. [place] status post gastric biopsy 09/20/2018 showing adenocarcinoma, with a CA 19-9  greater than 65,000; staging studies showing an apparent mass adjacent to the head of the pancreas, innumerable liver lesions, upper abdominal mesenteric and peripancreatic lymphadenopathy, and an isolated sclerotic density in the left iliac bone  (a) genomics requested on gastric biopsy showed the tumor to be HER-2 amplified at 3+, PD-L1 positive, and T p53 mutated.  MSI is stable, mismatch repair status is proficient and the tumor mutational burden is intermediate.  (1) chest CT scan 09/19/2018 shows right lower lobe pulmonary embolism  (a) on intravenous heparin started 09/19/2018, transitioned to apixaban 10/01/2018  (2) genetics testing 10/04/2018: negative  (a) family history of prostate and early breast cancer; patient has a history of prostate cancer  (3) started gemcitabine/Abraxane 10/09/2018, repeated days 1, 8 and 15 of each 28-day cycle  (a) stopped after 1 cycle (3 doses) with reassessment of diagnosis based on CARIS results, noted above  (4) to start oxaliplatin, capecitabine and trastuzumab 11/06/2018  (a) baseline echocardiogram 09/20/2018 shows an ejection fraction in the 55-60% range  (b) oxaliplatin and trastuzumab will be given every 21 days  (c) capecitabine dose will be 1000 mg twice daily for 14 days of  every 21-day cycle  (d) CT of the abdomen and pelvis 02/11/2019 shows marked improvement in his liver lesions and resolution of periportal adenopathy   PLAN: Sohil is having an excellent response to Capox/trastuzumab and he is tolerating it well.  I reviewed the results of his restaging CT scans with him.  They are very favorable.    He was scheduled for continuing treatment today but given his fever we did a fever work-up with blood cultures, urine and urine culture and chest x-ray.  He also had a coronavirus test done this afternoon.  Results so far shows a normal urinalysis and negative chest x-ray.  Given that he was not neutropenic and that he was clinically stable he was started on ciprofloxacin.  He will call if he has any new symptoms or worsening symptoms but otherwise he will return in 1 week and I expect to resume chemotherapy at that time  He knows to call for any other issue that may develop before then.   Virgie Dad. Magrinat, MD 02/18/19 4:59 PM Medical Oncology and Hematology [facility] [address] [place], [place] [address] Tel. [phone]    Fax. [phone]    I, Wilburn Mylar, am acting as scribe for Dr. Virgie Dad. Magrinat.  I, Lurline Del MD, have reviewed the above documentation for accuracy and completeness, and I agree with the above.

## 2019-02-18 ENCOUNTER — Ambulatory Visit (HOSPITAL_COMMUNITY)
Admission: RE | Admit: 2019-02-18 | Discharge: 2019-02-18 | Disposition: A | Discharge status: Home / Self Care | Payer: Medicare Other | Source: Ambulatory Visit | Attending: Oncology | Admitting: Oncology

## 2019-02-18 ENCOUNTER — Other Ambulatory Visit: Payer: Medicare Other

## 2019-02-18 ENCOUNTER — Inpatient Hospital Stay (HOSPITAL_BASED_OUTPATIENT_CLINIC_OR_DEPARTMENT_OTHER): Payer: Medicare Other | Admitting: Oncology

## 2019-02-18 ENCOUNTER — Inpatient Hospital Stay: Payer: Medicare Other

## 2019-02-18 ENCOUNTER — Inpatient Hospital Stay: Payer: Medicare Other | Attending: Oncology

## 2019-02-18 ENCOUNTER — Other Ambulatory Visit: Payer: Self-pay

## 2019-02-18 VITALS — BP 145/72 | HR 81 | Temp 99.6°F | Resp 17 | Ht 64.5 in | Wt 142.2 lb

## 2019-02-18 DIAGNOSIS — C787 Secondary malignant neoplasm of liver and intrahepatic bile duct: Secondary | ICD-10-CM | POA: Diagnosis not present

## 2019-02-18 DIAGNOSIS — R509 Fever, unspecified: Secondary | ICD-10-CM | POA: Diagnosis not present

## 2019-02-18 DIAGNOSIS — Z5112 Encounter for antineoplastic immunotherapy: Secondary | ICD-10-CM | POA: Insufficient documentation

## 2019-02-18 DIAGNOSIS — Z7901 Long term (current) use of anticoagulants: Secondary | ICD-10-CM

## 2019-02-18 DIAGNOSIS — Z8546 Personal history of malignant neoplasm of prostate: Secondary | ICD-10-CM

## 2019-02-18 DIAGNOSIS — C169 Malignant neoplasm of stomach, unspecified: Secondary | ICD-10-CM

## 2019-02-18 DIAGNOSIS — Z5189 Encounter for other specified aftercare: Secondary | ICD-10-CM | POA: Diagnosis not present

## 2019-02-18 DIAGNOSIS — Z803 Family history of malignant neoplasm of breast: Secondary | ICD-10-CM | POA: Insufficient documentation

## 2019-02-18 DIAGNOSIS — Z87891 Personal history of nicotine dependence: Secondary | ICD-10-CM | POA: Insufficient documentation

## 2019-02-18 DIAGNOSIS — Z5111 Encounter for antineoplastic chemotherapy: Secondary | ICD-10-CM | POA: Diagnosis present

## 2019-02-18 DIAGNOSIS — Z79899 Other long term (current) drug therapy: Secondary | ICD-10-CM | POA: Insufficient documentation

## 2019-02-18 DIAGNOSIS — C168 Malignant neoplasm of overlapping sites of stomach: Secondary | ICD-10-CM

## 2019-02-18 DIAGNOSIS — D649 Anemia, unspecified: Secondary | ICD-10-CM

## 2019-02-18 DIAGNOSIS — Z86711 Personal history of pulmonary embolism: Secondary | ICD-10-CM

## 2019-02-18 DIAGNOSIS — C161 Malignant neoplasm of fundus of stomach: Secondary | ICD-10-CM

## 2019-02-18 LAB — URINALYSIS, COMPLETE (UACMP) WITH MICROSCOPIC
Bilirubin Urine: NEGATIVE
Glucose, UA: NEGATIVE mg/dL
Hgb urine dipstick: NEGATIVE
Ketones, ur: NEGATIVE mg/dL
Leukocytes,Ua: NEGATIVE
Nitrite: NEGATIVE
Protein, ur: 30 mg/dL — AB
Specific Gravity, Urine: 1.018 (ref 1.005–1.030)
pH: 6 (ref 5.0–8.0)

## 2019-02-18 LAB — COMPREHENSIVE METABOLIC PANEL
ALT: 19 U/L (ref 0–44)
AST: 26 U/L (ref 15–41)
Albumin: 3.5 g/dL (ref 3.5–5.0)
Alkaline Phosphatase: 95 U/L (ref 38–126)
Anion gap: 8 (ref 5–15)
BUN: 13 mg/dL (ref 8–23)
CO2: 28 mmol/L (ref 22–32)
Calcium: 8.7 mg/dL — ABNORMAL LOW (ref 8.9–10.3)
Chloride: 103 mmol/L (ref 98–111)
Creatinine, Ser: 0.98 mg/dL (ref 0.61–1.24)
GFR calc Af Amer: >60 mL/min (ref >60)
GFR calc non Af Amer: >60 mL/min (ref >60)
Glucose, Bld: 100 mg/dL — ABNORMAL HIGH (ref 70–99)
Potassium: 3.4 mmol/L — ABNORMAL LOW (ref 3.5–5.1)
Sodium: 139 mmol/L (ref 135–145)
Total Bilirubin: 1.1 mg/dL (ref 0.3–1.2)
Total Protein: 6.5 g/dL (ref 6.5–8.1)

## 2019-02-18 LAB — CBC WITH DIFFERENTIAL/PLATELET
Abs Immature Granulocytes: 0.1 10*3/uL — ABNORMAL HIGH (ref 0.00–0.07)
Basophils Absolute: 0 10*3/uL (ref 0.0–0.1)
Basophils Relative: 0 %
Eosinophils Absolute: 0 10*3/uL (ref 0.0–0.5)
Eosinophils Relative: 0 %
HCT: 30.4 % — ABNORMAL LOW (ref 39.0–52.0)
Hemoglobin: 9.9 g/dL — ABNORMAL LOW (ref 13.0–17.0)
Immature Granulocytes: 1 %
Lymphocytes Relative: 14 %
Lymphs Abs: 1.4 10*3/uL (ref 0.7–4.0)
MCH: 32.7 pg (ref 26.0–34.0)
MCHC: 32.6 g/dL (ref 30.0–36.0)
MCV: 100.3 fL — ABNORMAL HIGH (ref 80.0–100.0)
Monocytes Absolute: 1.4 10*3/uL — ABNORMAL HIGH (ref 0.1–1.0)
Monocytes Relative: 14 %
Neutro Abs: 7 10*3/uL (ref 1.7–7.7)
Neutrophils Relative %: 71 %
Platelets: 250 10*3/uL (ref 150–400)
RBC: 3.03 MIL/uL — ABNORMAL LOW (ref 4.22–5.81)
RDW: 23.2 % — ABNORMAL HIGH (ref 11.5–15.5)
WBC: 9.9 10*3/uL (ref 4.0–10.5)
nRBC: 0 % (ref 0.0–0.2)

## 2019-02-18 LAB — CULTURE, BLOOD (SINGLE)

## 2019-02-18 LAB — CEA (IN HOUSE-CHCC): CEA (CHCC-In House): 15.24 ng/mL — ABNORMAL HIGH (ref 0.00–5.00)

## 2019-02-18 MED ORDER — CIPROFLOXACIN HCL 500 MG PO TABS: 500.0000 mg | ORAL_TABLET | Freq: Two times a day (BID) | ORAL | 1 refills | Status: DC

## 2019-02-18 MED FILL — CIPROFLOXACIN HCL 500 MG TA: 500 | 7 days supply | Qty: 14 | Fill #0

## 2019-02-19 LAB — URINE CULTURE: Culture: NO GROWTH

## 2019-02-19 LAB — CANCER ANTIGEN 19-9: CA 19-9: 108 U/mL — ABNORMAL HIGH (ref 0–35)

## 2019-02-19 LAB — NOVEL CORONAVIRUS, NAA: SARS-CoV-2, NAA: NOT DETECTED

## 2019-02-20 ENCOUNTER — Other Ambulatory Visit: Payer: Self-pay | Admitting: Oncology

## 2019-02-20 ENCOUNTER — Telehealth: Payer: Self-pay | Admitting: Oncology

## 2019-02-20 NOTE — Telephone Encounter (Signed)
I talk with patient regarding schedule  

## 2019-02-23 LAB — CULTURE, BLOOD (SINGLE)
Culture: NO GROWTH
Culture: NO GROWTH
Special Requests: ADEQUATE

## 2019-02-23 NOTE — Progress Notes (Signed)
[place]  [phone] [phone]     ID: Peter Wood DOB: 1935/01/16  MR#: [id number]  GQB#:[id number]  Patient Care Team: Peter Anger, MD as PCP - General Peter Banister, MD as Attending Physician (Gastroenterology) Peter Wood, Peter Dad, MD as Consulting Physician (Oncology) Peter Mayer, MD as Consulting Physician (Gastroenterology) Peter Pedro, MD as Consulting Physician (Ophthalmology) Peter Dredge, MD as Referring Physician (Radiation Oncology) Peter Dresser, MD as Consulting Physician (Cardiology) OTHER MD:   CHIEF COMPLAINT: Metastatic gastric adenocarcinoma  CURRENT TREATMENT: oxaliplatin, capecitabine, trastuzumab   INTERVAL HISTORY: Peter Wood was seen today for follow-up and treatment of his metastatic gastric adenocarcinoma.   He continues on oxaliplatin, capecitabine and trastuzumab. Today is day 1 cycle 6. He tolerates this well and without any noticeable side effects.    He was supposed to have been treated 02/18/2019, but at that visit he was found to have a fever, which the patient was not aware of.  He was not neutropenic.  We did a fever work-up and blood cultures x2 and urine culture remain negative.  His last echocardiogram was on 02/12/2019, showing an ejection fraction of 60-65%, which is improved from 55-60% in 09/2018.  He was tested for the coronavirus and was negative. He has been trying to get his exercise indoors since it has been raining by walking up and down the stairs. He had a chest xray on 02/18/2019 showing: Right anterior chest wall Port-A-Cath is present with tip projecting over the superior vena cava. Stable cardiac and mediastinal contours. Tortuosity of the thoracic aorta. No large area pulmonary consolidation. No pleural effusion or pneumothorax. Regional skeleton is unremarkable.   He is now ready to resume treatment.  We are following his tumor marker: Lab Results  Component  Value Date   EKC003 491 (H) 02/18/2019   PHX505 185 (H) 01/28/2019   WPV948 416 (H) 01/07/2019   AXK553 976 (H) 12/18/2018   ZSM270 7,694 (H) 11/13/2018   His CEA also has decreased: Lab Results  Component Value Date   CEA1 15.24 (H) 02/18/2019   CEA1 30.78 (H) 01/28/2019   CEA1 61.52 (H) 01/07/2019   CEA1 1,237.70 (H) 11/06/2018    REVIEW OF SYSTEMS: Peter Wood notes that he had a recent fever, but had no other COVID-19 symptoms. He still underwent COVID-19 testing on 02/18/2019, which came back negative for the virus. The patient denies unusual headaches, visual changes, nausea, vomiting, or dizziness. There has been no unusual cough, phlegm production, or pleurisy. This been no change in bowel or bladder habits. The patient denies unexplained fatigue or unexplained weight loss, bleeding, rash, or fever. A detailed review of systems was otherwise noncontributory.     HISTORY OF CURRENT ILLNESS: From the original intake note:  Peter Wood presented to the emergency room 09/07/2018 with mid/upper abdominal pain.  He reported weight loss of 25 pounds over the past 6 months and significant anorexia.  His liver function tests were abnormal and a right upper quadrant ultrasound was obtained.  This was read as concerning for malignancy and a CT of the abdomen and pelvis with contrast was obtained the same day, showing  1. Too numerous to count hypodense masses of the liver concerning for hepatic metastasis. In the region of the porta hepatis, there are hypodense lesions more likely associated with the liver though the pancreatic head is partially obscured by a 3.2 cm hypodense mass. Findings are more likely associated with the liver and  less likely an isolated pancreatic mass. 2. Upper abdominal mesenteric and peripancreatic lymphadenopathy suspicious for metastatic disease. 3. 6 mm sclerotic density in the left iliac bone adjacent to the SI joint. Given history of prostate cancer,  differential considerations would include osteoblastic metastasis or possibly bone island. Showed only diverticular disease.  He was referred to GI and Peter Wood notes a colonoscopy performed 04/23/2017 makes the possibility of colon cancer unlikely.  Further work-up was planned, but on 09/19/2018 the patient again presented to the emergency room with chest pain, now in a different area, and a staging CT scan of the chest showed filling defects in the right lower lobe pulmonary arteries consistent with embolism.  There was a small pericardial effusion and an additional 2.4 cm left thyroid mass.  Aortic atherosclerosis was noted  He was started on intravenous heparin and on 09/20/2018 underwent upper endoscopy under Dr. Carlean Wood showing a giant nonbleeding cratered gastric ulcer in the posterior wall of the stomach.  Biopsies of this procedure showed (SZA 20-187) adenocarcinoma.   At that point we were consulted and we obtained tumor markers which included a CEA of 1318.2, a CA 19-9 of 67,479, a normal AFP and a normal PSA.  I discussed the case with pathology and they do not feel that additional testing would be helpful in terms of discriminating between a primary gastric and a primary biliary/pancreatic tumor.  The patient's subsequent history is as detailed below.   PAST MEDICAL HISTORY: Past Medical History:  Diagnosis Date   Arthritis    Diverticulosis of colon    Family history of breast cancer    Family history of prostate cancer    HTN (hypertension)    Hx of colonic polyp    Pneumonia    as a child/baby   Poor circulation of extremity    right leg   Prostate cancer ([place]) 2015   prostate cancer - radiation treated     PAST SURGICAL HISTORY: Past Surgical History:  Procedure Laterality Date   25 GAUGE PARS PLANA VITRECTOMY WITH 20 GAUGE MVR PORT FOR MACULAR HOLE Right 09/19/2016   Procedure: 25 GAUGE PARS PLANA VITRECTOMY WITH 20 GAUGE MVR PORT FOR MACULAR HOLE,  MEMBRANE PEEL, SERUM PATCH, GAS FLUID EXCHANGE, HEADSCOPE LASER;  Surgeon: Peter Pedro, MD;  Location: [facility];  Service: Ophthalmology;  Laterality: Right;   BIOPSY  09/20/2018   Procedure: BIOPSY;  Surgeon: Peter Mayer, MD;  Location: [facility];  Service: Endoscopy;;   CATARACT EXTRACTION  08/27/12   Right   COLONOSCOPY     ESOPHAGOGASTRODUODENOSCOPY (EGD) WITH PROPOFOL N/A 09/20/2018   Procedure: ESOPHAGOGASTRODUODENOSCOPY (EGD) WITH PROPOFOL;  Surgeon: Peter Mayer, MD;  Location: [facility];  Service: Endoscopy;  Laterality: N/A;   HAND SURGERY  2016   right thumb   IR IMAGING GUIDED PORT INSERTION  11/04/2018   KNEE SURGERY Left 1989     FAMILY HISTORY: Family History  Problem Relation Age of Onset   Prostate cancer Father        dx >50   Hypertension Mother    Ulcers Mother    Breast cancer Sister        dx 20's   Prostate cancer Brother        dx under 59, metasttic, cause of his death   Ulcers Brother    Ulcers Sister    Elman's father died from heart complications with a pacemaker at age 34; Kevon's father also has prostate cancer. Patients' mother died  from natural causes at age 16. The patient has 4 brothers and 3 sisters. One of [place] sisters had breast cancer in her 53s and a brother had prostate cancer. Patient denies anyone in her family having ovarian, or pancreatic cancer. Dimarco mentions that his youngest sister and his brother with prostate cancer also had stomach ulcers.    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 His wife, [name], is a retired [employer] summer Radiographer, therapeutic. As of 09/2018, they have been married for 56 years. They have 4 children, Claiborne Billings, [place], [place], and [place]. Claiborne Billings lives in [place] and works at the [employer]. Sherrod lives in [place] and works at [employer] and as a part Horticulturist, commercial. Germane lives in [place] and is a Engineer, drilling for a medical company. Beverely Low lives in  [place] and works at [employer]. Aikam has 7 grandchildren and 1 step great grandchild. He attends [employer].     ADVANCED DIRECTIVES: His wife, [name], is automatically his healthcare power of attorney.     HEALTH MAINTENANCE: Social History   Tobacco Use   Smoking status: Former Smoker    Years: 4.00    Quit date: 09/12/1967    Years since quitting: 51.4   Smokeless tobacco: Never Used  Substance Use Topics   Alcohol use: No   Drug use: No    Colonoscopy: 2018/[facility]  PSA: 0.54 on 09/18/2018  No Known Allergies  Current Outpatient Medications  Medication Sig Dispense Refill   amLODipine (NORVASC) 5 MG tablet TAKE ONE TABLET BY MOUTH ONE TIME DAILY  90 tablet 2   apixaban (ELIQUIS) 2.5 MG TABS tablet Take 1 tablet (2.5 mg total) by mouth 2 (two) times daily. 60 tablet 12   benazepril (LOTENSIN) 40 MG tablet TAKE ONE TABLET BY MOUTH ONE TIME DAILY  90 tablet 2   capecitabine (XELODA) 500 MG tablet TAKE 2 TABLETS (1,000 MG TOTAL) BY MOUTH 2 (TWO) TIMES DAILY AFTER A MEAL. TAKE ON DAYS 1-14 OF CHEMOTHERAPY. REPEAT EVERY 21 DAYS. 56 tablet 4   Cholecalciferol 1000 UNITS tablet Take 1,000 Units by mouth daily.      ciprofloxacin (CIPRO) 500 MG tablet Take 1 tablet (500 mg total) by mouth 2 (two) times daily. 14 tablet 1   latanoprost (XALATAN) 0.005 % ophthalmic solution Place 1 drop into both eyes at bedtime.      lidocaine-prilocaine (EMLA) cream Apply to affected area once 30 g 3   ondansetron (ZOFRAN) 4 MG tablet Take 1 tablet (4 mg total) by mouth every 8 (eight) hours as needed for nausea or vomiting. 20 tablet 1   pantoprazole (PROTONIX) 40 MG tablet Take 1 tablet (40 mg total) by mouth 2 (two) times daily before a meal. 60 tablet 0   prochlorperazine (COMPAZINE) 10 MG tablet Take 1 tablet (10 mg total) by mouth every 6 (six) hours as needed (Nausea or vomiting). 30 tablet 1   No current facility-administered medications for this visit.     Facility-Administered Medications Ordered in Other Visits  Medication Dose Route Frequency Provider Last Rate Last Dose   sodium chloride flush (NS) 0.9 % injection 10 mL  10 mL Intravenous PRN Yuleimy Kretz, Peter Dad, MD       sodium chloride flush (NS) 0.9 % injection 10 mL  10 mL Intracatheter Once Latonya Knight, Peter Dad, MD         OBJECTIVE: Elderly African-American man who looks well Vitals:   02/24/19 0854  BP: (!) 153/76  Pulse: 69  Resp: 18  Temp: 98.9 F (37.2 C)  SpO2: 100%     There is no height or weight on file to calculate BMI.   Wt Readings from Last 3 Encounters:  02/18/19 142 lb 3.2 oz (64.5 kg)  01/28/19 140 lb 12.8 oz (63.9 kg)  01/07/19 141 lb 3.2 oz (64 kg)  ECOG FS:1 - Symptomatic but completely ambulatory  Sclerae unicteric, pupils round and equal No cervical or supraclavicular adenopathy Lungs no rales or rhonchi Heart regular rate and rhythm Abd soft, nontender, positive bowel sounds MSK no focal spinal tenderness, no upper extremity lymphedema Neuro: nonfocal, well oriented, appropriate affect  LAB RESULTS:  CMP     Component Value Date/Time   NA 139 02/18/2019 1029   K 3.4 (L) 02/18/2019 1029   CL 103 02/18/2019 1029   CO2 28 02/18/2019 1029   GLUCOSE 100 (H) 02/18/2019 1029   GLUCOSE 105 (H) 09/13/2006 0900   BUN 13 02/18/2019 1029   CREATININE 0.98 02/18/2019 1029   CREATININE 1.02 10/16/2018 0936   CALCIUM 8.7 (L) 02/18/2019 1029   PROT 6.5 02/18/2019 1029   ALBUMIN 3.5 02/18/2019 1029   AST 26 02/18/2019 1029   AST 76 (H) 10/16/2018 0936   ALT 19 02/18/2019 1029   ALT 47 (H) 10/16/2018 0936   ALKPHOS 95 02/18/2019 1029   BILITOT 1.1 02/18/2019 1029   BILITOT 0.7 10/16/2018 0936   GFRNONAA >60 02/18/2019 1029   GFRNONAA >60 10/16/2018 0936   GFRAA >60 02/18/2019 1029   GFRAA >60 10/16/2018 0936    No results found for: TOTALPROTELP, ALBUMINELP, A1GS, A2GS, BETS, BETA2SER, GAMS, MSPIKE, SPEI  No results found for: KPAFRELGTCHN,  LAMBDASER, KAPLAMBRATIO  Lab Results  Component Value Date   WBC 9.9 02/18/2019   NEUTROABS 7.0 02/18/2019   HGB 9.9 (L) 02/18/2019   HCT 30.4 (L) 02/18/2019   MCV 100.3 (H) 02/18/2019   PLT 250 02/18/2019    '@LASTCHEMISTRY'$ @  No results found for: LABCA2  No components found for: IOXBDZ329  No results for input(s): INR in the last 168 hours.  No results found for: LABCA2  Lab Results  Component Value Date   CAN199 108 (H) 02/18/2019    No results found for: JME268  No results found for: TMH962  No results found for: CA2729  No components found for: HGQUANT  Lab Results  Component Value Date   CEA1 15.24 (H) 02/18/2019   /  CEA ([facility])  Date Value Ref Range Status  02/18/2019 15.24 (H) 0.00 - 5.00 ng/mL Final    Comment:    (NOTE) This test was performed using [employer]'s Chemiluminescent Microparticle Immunoassay. Values obtained from different assay methods cannot be used interchangeably. Please note that 5-10% of patients who smoke may see CEA levels up to 6.9 ng/mL. Performed at [facility], [place] [address], [place], [place] [address]      No results found for: AFPTUMOR  No results found for: [place]  No results found for: PSA1  No visits with results within 3 Day(s) from this visit.  Latest known visit with results is:  Appointment on 02/18/2019  Component Date Value Ref Range Status   SARS-CoV-2, NAA 02/18/2019 Not Detected  Not Detected Final   Comment: This test was developed and its performance characteristics determined by [employer], [employer] and Company. This test has not been FDA cleared or approved. This test has been authorized by FDA under an Emergency Use Authorization (EUA). This test is only  authorized for the duration of time the declaration that circumstances exist justifying the authorization of the emergency use of in vitro diagnostic tests for detection of SARS-CoV-2 virus and/or diagnosis of  COVID-19 infection under section 564(b)(1) of the Act, 21 U.S.C. 161WRU-0(A)(5), unless the authorization is terminated or revoked sooner. When diagnostic testing is negative, the possibility of a false negative result should be considered in the context of a patient's recent exposures and the presence of clinical signs and symptoms consistent with COVID-19. An individual without symptoms of COVID-19 and who is not shedding SARS-CoV-2 virus would expect to have a negative (not detected) result in this assay.     (this displays the last labs from the last 3 days)  No results found for: TOTALPROTELP, ALBUMINELP, A1GS, A2GS, BETS, BETA2SER, GAMS, MSPIKE, SPEI (this displays SPEP labs)  No results found for: KPAFRELGTCHN, LAMBDASER, KAPLAMBRATIO (kappa/lambda light chains)  No results found for: HGBA, HGBA2QUANT, HGBFQUANT, HGBSQUAN (Hemoglobinopathy evaluation)   No results found for: LDH  Lab Results  Component Value Date   IRON 38 (L) 09/21/2018   TIBC 251 09/21/2018   IRONPCTSAT 15 (L) 09/21/2018   (Iron and TIBC)  Lab Results  Component Value Date   FERRITIN 138 09/21/2018    Urinalysis    Component Value Date/Time   COLORURINE YELLOW 02/18/2019 1056   APPEARANCEUR CLEAR 02/18/2019 1056   LABSPEC 1.018 02/18/2019 1056   PHURINE 6.0 02/18/2019 1056   GLUCOSEU NEGATIVE 02/18/2019 1056   GLUCOSEU NEGATIVE 07/20/2017 1056   HGBUR NEGATIVE 02/18/2019 1056   BILIRUBINUR NEGATIVE 02/18/2019 1056   KETONESUR NEGATIVE 02/18/2019 1056   PROTEINUR 30 (A) 02/18/2019 1056   UROBILINOGEN 0.2 07/20/2017 1056   NITRITE NEGATIVE 02/18/2019 [place] 02/18/2019 1056     STUDIES:  Dg Chest 2 View  Result Date: 02/18/2019 CLINICAL DATA:  Patient with fever. EXAM: CHEST - 2 VIEW COMPARISON:  Chest CT 09/19/2018 FINDINGS: Right anterior chest wall Port-A-Cath is present with tip projecting over the superior vena cava. Stable cardiac and mediastinal contours.  Tortuosity of the thoracic aorta. No large area pulmonary consolidation. No pleural effusion or pneumothorax. Regional skeleton is unremarkable. IMPRESSION: No acute cardiopulmonary process. Electronically Signed   By: Lovey Newcomer M.D.   On: 02/18/2019 16:25   Ct Abdomen Pelvis W Contrast  Result Date: 02/11/2019 CLINICAL DATA:  Gastric adenocarcinoma. Liver metastasis. Chemotherapy ongoing EXAM: CT ABDOMEN AND PELVIS WITH CONTRAST TECHNIQUE: Multidetector CT imaging of the abdomen and pelvis was performed using the standard protocol following bolus administration of intravenous contrast. CONTRAST:  167m OMNIPAQUE IOHEXOL 300 MG/ML  SOLN COMPARISON:  09/07/2018 FINDINGS: Lower chest: Lung bases are clear. Hepatobiliary: Multiple low-density hepatic lesions are decreased in size compared to prior. New lesions are no longer visible. Example lesion measuring 10 mm in LEFT lateral hepatic lobe (image 13/2) compares to 30 mm. Example lesion in the superior RIGHT hepatic lobe measures 10 mm compared to 15 mm. Example lesion inferior RIGHT hepatic lobe measuring 9 mm (image 28/2) decreased from 24 mm. No new lesions identified. No biliary duct dilatation.  Gallbladder normal Pancreas: Pancreas is normal. No ductal dilatation. No pancreatic inflammation. Spleen: Normal spleen Adrenals/urinary tract: Thickening through the GE junction/gastric cardia. Thickening through through the gastric antrum. No discrete measurable mass lesion. Small bowel, appendix and cecum normal. Moderate volume stool throughout the colon. Stomach/Bowel: Stomach, small bowel, appendix, and cecum are normal. The colon and rectosigmoid colon are normal. Vascular/Lymphatic: Abdominal aorta is normal caliber. No  periportal or retroperitoneal adenopathy. No pelvic adenopathy. 11 mm periportal lymph node (image 16/2) decreased from 32 mm on prior. Reproductive: Fiducial markers in the prostate Other: No peritoneal metastasis Musculoskeletal: Is  IMPRESSION: 1. Marked reduction in size of hepatic metastasis. No new metastatic lesions. 2. Thickened gastric cardia and antrum of the stomach. No measurable mass lesion. 3. Resolution of previous seen bulky periportal adenopathy. 4. No new adenopathy. Electronically Signed   By: Suzy Bouchard M.D.   On: 02/11/2019 14:13     ELIGIBLE FOR AVAILABLE RESEARCH PROTOCOL: no   ASSESSMENT: 84 y.o. [place], [place] man status post gastric biopsy 09/20/2018 showing adenocarcinoma, with a CA 19-9 greater than 65,000; staging studies showing an apparent mass adjacent to the head of the pancreas, innumerable liver lesions, upper abdominal mesenteric and peripancreatic lymphadenopathy, and an isolated sclerotic density in the left iliac bone  (a) genomics requested on gastric biopsy showed the tumor to be HER-2 amplified at 3+, PD-L1 positive, and T p53 mutated.  MSI is stable, mismatch repair status is proficient and the tumor mutational burden is intermediate.  (1) chest CT scan 09/19/2018 shows right lower lobe pulmonary embolism  (a) on intravenous heparin started 09/19/2018, transitioned to apixaban 10/01/2018  (2) genetics testing 10/04/2018: negative  (a) family history of prostate and early breast cancer; patient has a history of prostate cancer  (3) started gemcitabine/Abraxane 10/09/2018, repeated days 1, 8 and 15 of each 28-day cycle  (a) stopped after 1 cycle (3 doses) with reassessment of diagnosis based on CARIS results, noted above  (4) to start oxaliplatin, capecitabine and trastuzumab 11/06/2018  (a) baseline echocardiogram 09/20/2018 shows an ejection fraction in the 55-60% range  (b) oxaliplatin and trastuzumab will be given every 21 days  (c) capecitabine dose will be 1000 mg twice daily for 14 days of every 21-day cycle  (d) CT of the abdomen and pelvis 02/11/2019 shows marked improvement in his liver lesions and resolution of periportal adenopathy   PLAN: Bird's fever work-up  was entirely negative.  He completed a course of Cipro.  He is ready to resume treatment.  Both he and his wife were checked for the COVID-19 and both were negative.  Fortunately he is having no peripheral neuropathy issues.  In fact he is tolerating his treatment remarkably well  We reviewed results of his echocardiogram and CT scans which are very favorable.  He started his capecitabine, which he takes at a low dose, today, and that will continue for 14 days.  He receives oxaliplatin and trastuzumab today.  His next treatment will be 03/18/2019 and of course we will see him that day  The plan at this point is to continue 4 more cycles of chemo, restage, and then likely at that point move him to trastuzumab only which will be continued indefinitely.  He knows to call for any other issue that may develop before the next visit.   Peter Wood. Maayan Jenning, MD 02/24/19 9:07 AM Medical Oncology and Hematology [facility] [address] [place], [place] [address] Tel. [phone]    Fax. [phone]    I, Jacqualyn Posey am acting as a Education administrator for Chauncey Cruel, MD.   I, Lurline Del MD, have reviewed the above documentation for accuracy and completeness, and I agree with the above.

## 2019-02-24 ENCOUNTER — Inpatient Hospital Stay: Payer: Medicare Other

## 2019-02-24 ENCOUNTER — Other Ambulatory Visit: Payer: Self-pay

## 2019-02-24 ENCOUNTER — Inpatient Hospital Stay (HOSPITAL_BASED_OUTPATIENT_CLINIC_OR_DEPARTMENT_OTHER): Payer: Medicare Other | Admitting: Oncology

## 2019-02-24 VITALS — BP 153/76 | HR 69 | Temp 98.9°F | Resp 18

## 2019-02-24 DIAGNOSIS — Z87891 Personal history of nicotine dependence: Secondary | ICD-10-CM

## 2019-02-24 DIAGNOSIS — C799 Secondary malignant neoplasm of unspecified site: Secondary | ICD-10-CM

## 2019-02-24 DIAGNOSIS — C787 Secondary malignant neoplasm of liver and intrahepatic bile duct: Secondary | ICD-10-CM | POA: Diagnosis not present

## 2019-02-24 DIAGNOSIS — C169 Malignant neoplasm of stomach, unspecified: Secondary | ICD-10-CM

## 2019-02-24 DIAGNOSIS — Z5112 Encounter for antineoplastic immunotherapy: Secondary | ICD-10-CM | POA: Diagnosis not present

## 2019-02-24 DIAGNOSIS — C161 Malignant neoplasm of fundus of stomach: Secondary | ICD-10-CM

## 2019-02-24 DIAGNOSIS — Z803 Family history of malignant neoplasm of breast: Secondary | ICD-10-CM

## 2019-02-24 DIAGNOSIS — Z8546 Personal history of malignant neoplasm of prostate: Secondary | ICD-10-CM

## 2019-02-24 DIAGNOSIS — Z86711 Personal history of pulmonary embolism: Secondary | ICD-10-CM | POA: Diagnosis not present

## 2019-02-24 DIAGNOSIS — D649 Anemia, unspecified: Secondary | ICD-10-CM

## 2019-02-24 DIAGNOSIS — C168 Malignant neoplasm of overlapping sites of stomach: Secondary | ICD-10-CM

## 2019-02-24 DIAGNOSIS — Z7901 Long term (current) use of anticoagulants: Secondary | ICD-10-CM

## 2019-02-24 DIAGNOSIS — Z79899 Other long term (current) drug therapy: Secondary | ICD-10-CM

## 2019-02-24 LAB — CBC WITH DIFFERENTIAL/PLATELET
Abs Immature Granulocytes: 0.03 10*3/uL (ref 0.00–0.07)
Basophils Absolute: 0.1 10*3/uL (ref 0.0–0.1)
Basophils Relative: 1 %
Eosinophils Absolute: 0.1 10*3/uL (ref 0.0–0.5)
Eosinophils Relative: 2 %
HCT: 28.1 % — ABNORMAL LOW (ref 39.0–52.0)
Hemoglobin: 9 g/dL — ABNORMAL LOW (ref 13.0–17.0)
Immature Granulocytes: 1 %
Lymphocytes Relative: 20 %
Lymphs Abs: 1 10*3/uL (ref 0.7–4.0)
MCH: 31.7 pg (ref 26.0–34.0)
MCHC: 32 g/dL (ref 30.0–36.0)
MCV: 98.9 fL (ref 80.0–100.0)
Monocytes Absolute: 0.5 10*3/uL (ref 0.1–1.0)
Monocytes Relative: 10 %
Neutro Abs: 3.3 10*3/uL (ref 1.7–7.7)
Neutrophils Relative %: 66 %
Platelets: 259 10*3/uL (ref 150–400)
RBC: 2.84 MIL/uL — ABNORMAL LOW (ref 4.22–5.81)
RDW: 22.2 % — ABNORMAL HIGH (ref 11.5–15.5)
WBC: 5 10*3/uL (ref 4.0–10.5)
nRBC: 0 % (ref 0.0–0.2)

## 2019-02-24 LAB — COMPREHENSIVE METABOLIC PANEL
ALT: 22 U/L (ref 0–44)
AST: 24 U/L (ref 15–41)
Albumin: 3.3 g/dL — ABNORMAL LOW (ref 3.5–5.0)
Alkaline Phosphatase: 95 U/L (ref 38–126)
Anion gap: 10 (ref 5–15)
BUN: 11 mg/dL (ref 8–23)
CO2: 26 mmol/L (ref 22–32)
Calcium: 8.7 mg/dL — ABNORMAL LOW (ref 8.9–10.3)
Chloride: 104 mmol/L (ref 98–111)
Creatinine, Ser: 0.95 mg/dL (ref 0.61–1.24)
GFR calc Af Amer: >60 mL/min (ref >60)
GFR calc non Af Amer: >60 mL/min (ref >60)
Glucose, Bld: 88 mg/dL (ref 70–99)
Potassium: 3.9 mmol/L (ref 3.5–5.1)
Sodium: 140 mmol/L (ref 135–145)
Total Bilirubin: 0.5 mg/dL (ref 0.3–1.2)
Total Protein: 6.5 g/dL (ref 6.5–8.1)

## 2019-02-24 MED ORDER — ACETAMINOPHEN 325 MG PO TABS
650.0000 mg | ORAL_TABLET | Freq: Once | ORAL | Status: AC
  Administered 2019-02-24: 650 mg via ORAL

## 2019-02-24 MED ORDER — DIPHENHYDRAMINE HCL 25 MG PO CAPS
25.0000 mg | ORAL_CAPSULE | Freq: Once | ORAL | Status: AC
  Administered 2019-02-24: 25 mg via ORAL

## 2019-02-24 MED ORDER — DIPHENHYDRAMINE HCL 25 MG PO CAPS
ORAL_CAPSULE | ORAL | Status: AC
  Filled 2019-02-24: qty 1

## 2019-02-24 MED ORDER — PEGFILGRASTIM 6 MG/0.6ML ~~LOC~~ PSKT
PREFILLED_SYRINGE | SUBCUTANEOUS | Status: AC
  Filled 2019-02-24: qty 0.6

## 2019-02-24 MED ORDER — SODIUM CHLORIDE 0.9 % IV SOLN
Freq: Once | INTRAVENOUS | Status: AC
  Administered 2019-02-24: 11:00:00 via INTRAVENOUS
  Filled 2019-02-24: qty 250

## 2019-02-24 MED ORDER — OXALIPLATIN CHEMO INJECTION 100 MG/20ML
85.0000 mg/m2 | Freq: Once | INTRAVENOUS | Status: AC
  Administered 2019-02-24: 140 mg via INTRAVENOUS
  Filled 2019-02-24: qty 20

## 2019-02-24 MED ORDER — PALONOSETRON HCL INJECTION 0.25 MG/5ML
INTRAVENOUS | Status: AC
  Filled 2019-02-24: qty 5

## 2019-02-24 MED ORDER — TRASTUZUMAB CHEMO 150 MG IV SOLR
6.0000 mg/kg | Freq: Once | INTRAVENOUS | Status: AC
  Administered 2019-02-24: 357 mg via INTRAVENOUS
  Filled 2019-02-24: qty 17

## 2019-02-24 MED ORDER — SODIUM CHLORIDE 0.9% FLUSH
10.0000 mL | INTRAVENOUS | Status: DC | PRN
  Filled 2019-02-24: qty 10

## 2019-02-24 MED ORDER — PALONOSETRON HCL INJECTION 0.25 MG/5ML
0.2500 mg | Freq: Once | INTRAVENOUS | Status: AC
  Administered 2019-02-24: 11:00:00 0.25 mg via INTRAVENOUS

## 2019-02-24 MED ORDER — HEPARIN SOD (PORK) LOCK FLUSH 100 UNIT/ML IV SOLN
500.0000 [IU] | Freq: Once | INTRAVENOUS | Status: AC | PRN
  Administered 2019-02-24: 500 [IU]
  Filled 2019-02-24: qty 5

## 2019-02-24 MED ORDER — PEGFILGRASTIM 6 MG/0.6ML ~~LOC~~ PSKT
6.0000 mg | PREFILLED_SYRINGE | Freq: Once | SUBCUTANEOUS | Status: AC
  Administered 2019-02-24: 6 mg via SUBCUTANEOUS

## 2019-02-24 MED ORDER — ACETAMINOPHEN 325 MG PO TABS
ORAL_TABLET | ORAL | Status: AC
  Filled 2019-02-24: qty 2

## 2019-02-24 MED ORDER — DEXTROSE 5 % IV SOLN
Freq: Once | INTRAVENOUS | Status: AC
  Administered 2019-02-24: 12:00:00 via INTRAVENOUS
  Filled 2019-02-24: qty 250

## 2019-02-24 MED ORDER — SODIUM CHLORIDE 0.9 % IV SOLN
Freq: Once | INTRAVENOUS | Status: AC
  Administered 2019-02-24: 11:00:00 via INTRAVENOUS
  Filled 2019-02-24: qty 5

## 2019-02-24 MED ORDER — SODIUM CHLORIDE 0.9% FLUSH
10.0000 mL | Freq: Once | INTRAVENOUS | Status: AC
  Administered 2019-02-24: 10 mL
  Filled 2019-02-24: qty 10

## 2019-02-24 NOTE — Patient Instructions (Signed)
[facility] Discharge Instructions for Patients Receiving Chemotherapy  Today you received the following chemotherapy agents Oxaliplatin (ELOXATIN) & Trastuzumab (HERCEPTIN).  To help prevent nausea and vomiting after your treatment, we encourage you to take your nausea medication as prescribed.  If you develop nausea and vomiting that is not controlled by your nausea medication, call the clinic.   BELOW ARE SYMPTOMS THAT SHOULD BE REPORTED IMMEDIATELY:  *FEVER GREATER THAN 100.5 F  *CHILLS WITH OR WITHOUT FEVER  NAUSEA AND VOMITING THAT IS NOT CONTROLLED WITH YOUR NAUSEA MEDICATION  *UNUSUAL SHORTNESS OF BREATH  *UNUSUAL BRUISING OR BLEEDING  TENDERNESS IN MOUTH AND THROAT WITH OR WITHOUT PRESENCE OF ULCERS  *URINARY PROBLEMS  *BOWEL PROBLEMS  UNUSUAL RASH Items with * indicate a potential emergency and should be followed up as soon as possible.  Feel free to call the clinic should you have any questions or concerns. The clinic phone number is [phone].  Please show the [facility] at check-in to the Emergency Department and triage nurse.  Coronavirus (COVID-19) Are you at risk?  Are you at risk for the Coronavirus (COVID-19)?  To be considered HIGH RISK for Coronavirus (COVID-19), you have to meet the following criteria:  . Traveled to [place], [place], [place], [place] or [place]; or in the [place] to [place], [place], [place], or [place]; and have fever, cough, and shortness of breath within the last 2 weeks of travel OR . Been in close contact with a person diagnosed with COVID-19 within the last 2 weeks and have fever, cough, and shortness of breath . IF YOU DO NOT MEET THESE CRITERIA, YOU ARE CONSIDERED LOW RISK FOR COVID-19.  What to do if you are HIGH RISK for COVID-19?  Marland Kitchen If you are having a medical emergency, call 911. . Seek medical care right away. Before you go to a doctor's office, urgent care or emergency department,  call ahead and tell them about your recent travel, contact with someone diagnosed with COVID-19, and your symptoms. You should receive instructions from your physician's office regarding next steps of care.  . When you arrive at healthcare provider, tell the healthcare staff immediately you have returned from visiting [place], [place], [place], [place] or [place]; or traveled in the [place] to [place], [place], [place], or [place]; in the last two weeks or you have been in close contact with a person diagnosed with COVID-19 in the last 2 weeks.   . Tell the health care staff about your symptoms: fever, cough and shortness of breath. . After you have been seen by a medical provider, you will be either: o Tested for (COVID-19) and discharged home on quarantine except to seek medical care if symptoms worsen, and asked to  - Stay home and avoid contact with others until you get your results (4-5 days)  - Avoid travel on public transportation if possible (such as bus, train, or airplane) or o Sent to the Emergency Department by EMS for evaluation, COVID-19 testing, and possible admission depending on your condition and test results.  What to do if you are LOW RISK for COVID-19?  Reduce your risk of any infection by using the same precautions used for avoiding the common cold or flu:  Marland Kitchen Wash your hands often with soap and warm water for at least 20 seconds.  If soap and water are not readily available, use an alcohol-based hand sanitizer with at least 60% alcohol.  . If  coughing or sneezing, cover your mouth and nose by coughing or sneezing into the elbow areas of your shirt or coat, into a tissue or into your sleeve (not your hands). . Avoid shaking hands with others and consider head nods or verbal greetings only. . Avoid touching your eyes, nose, or mouth with unwashed hands.  . Avoid close contact with people who are sick. . Avoid places or events with large numbers of people in one  location, like concerts or sporting events. . Carefully consider travel plans you have or are making. . If you are planning any travel outside or inside the [place], visit the CDC's Travelers' Health webpage for the latest health notices. . If you have some symptoms but not all symptoms, continue to monitor at home and seek medical attention if your symptoms worsen. . If you are having a medical emergency, call 911.   [place] / e-Visit: [email or web address]         [facility]: [facility]: [phone]                   [facility]: [phone]

## 2019-02-26 IMAGING — XA IR FLUORO GUIDE CV LINE*L*
1 series · 1 of 1 positions shown · non-contrast
Comparison: none

INDICATION: 83-year-old male with a history of metastatic pancreatic carcinoma

[Series 300: ir imaging guided port insertion · 1 of 1 slices shown]
[im 1/1]
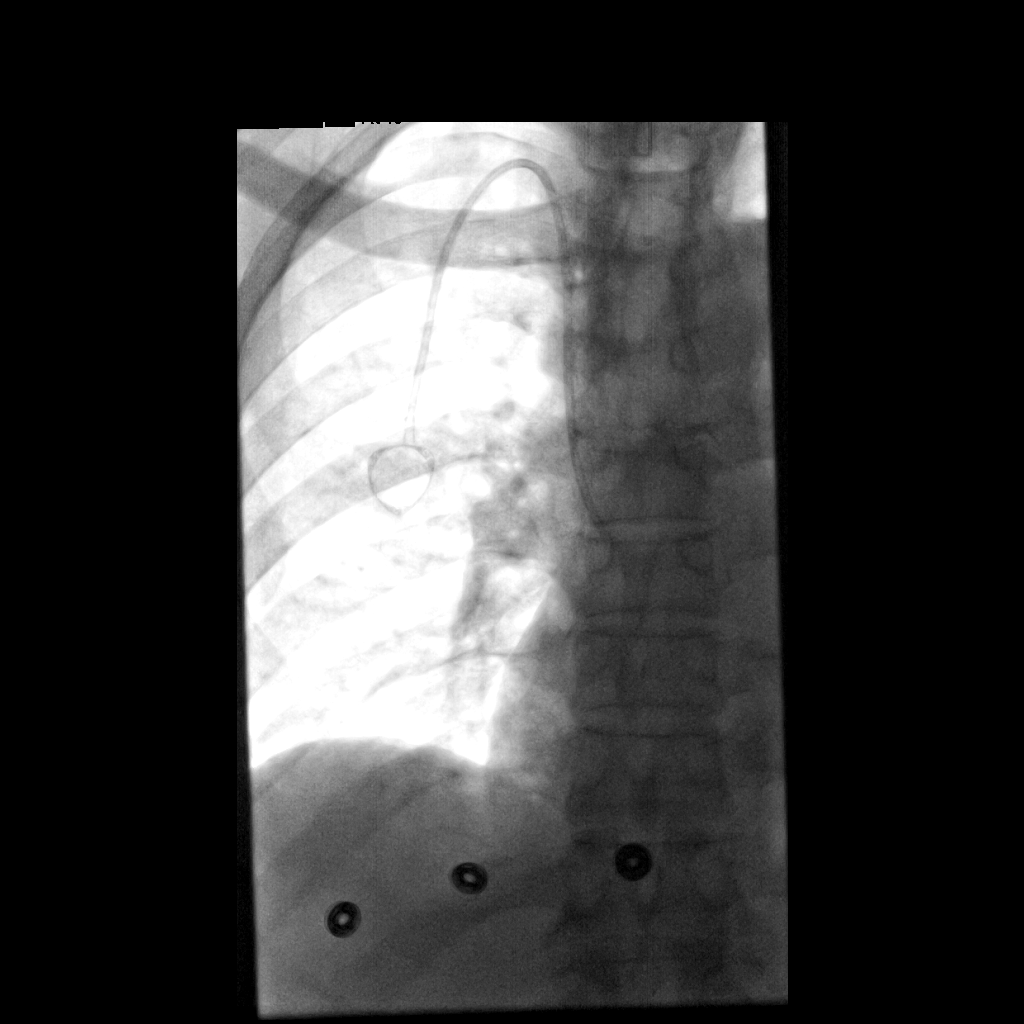

[1 of 1 positions shown; findings below may reference images not displayed]

EXAM:
IMPLANTED PORT A CATH PLACEMENT WITH ULTRASOUND AND FLUOROSCOPIC
GUIDANCE

MEDICATIONS:
2.0 g Ancef; The antibiotic was administered within an appropriate
time interval prior to skin puncture.

ANESTHESIA/SEDATION:
Moderate (conscious) sedation was employed during this procedure. A
total of Versed 1.5 mg and Fentanyl 25 mcg was administered
intravenously.

Moderate Sedation Time: 19 minutes. The patient's level of
consciousness and vital signs were monitored continuously by
radiology nursing throughout the procedure under my direct
supervision.

FLUOROSCOPY TIME:  0 minutes, 12 seconds (1.0 mGy)

COMPLICATIONS:
None

PROCEDURE:
The procedure, risks, benefits, and alternatives were explained to
the patient. Questions regarding the procedure were encouraged and
answered. The patient understands and consents to the procedure.

Ultrasound survey was performed with images stored and sent to PACs.

The right neck and chest was prepped with chlorhexidine, and draped
in the usual sterile fashion using maximum barrier technique (cap
and mask, sterile gown, sterile gloves, large sterile sheet, hand
hygiene and cutaneous antiseptic). Antibiotic prophylaxis was
provided with 2.0g Ancef administered IV one hour prior to skin
incision. Local anesthesia was attained by infiltration with 1%
lidocaine without epinephrine.

Ultrasound demonstrated patency of the right internal jugular vein,
and this was documented with an image. Under real-time ultrasound
guidance, this vein was accessed with a 21 gauge micropuncture
needle and image documentation was performed. A small dermatotomy
was made at the access site with an 11 scalpel. A 0.018" wire was
advanced into the SVC and used to estimate the length of the
internal catheter. The access needle exchanged for a 4F
micropuncture vascular sheath. The 0.018" wire was then removed and
a 0.035" wire advanced into the IVC.



The venous access site was then serially dilated and a peel away
vascular sheath placed over the wire. The wire was removed and the
port catheter advanced into position under fluoroscopic guidance.
The catheter tip is positioned in the cavoatrial junction. This was
documented with a spot image. The portacatheter was then tested and
found to flush and aspirate well. The port was flushed with saline
followed by 100 units/mL heparinized saline.

The pocket was then closed in two layers using first subdermal
inverted interrupted absorbable sutures followed by a running
subcuticular suture. The epidermis was then sealed with Dermabond.
The dermatotomy at the venous access site was also seal with
Dermabond.

Patient tolerated the procedure well and remained hemodynamically
stable throughout.

No complications encountered and no significant blood loss
encountered
IMPRESSION: Status post right IJ port catheter placement.

## 2019-02-27 ENCOUNTER — Telehealth: Payer: Self-pay

## 2019-02-27 NOTE — Telephone Encounter (Signed)
LM for patient to return call.

## 2019-03-10 ENCOUNTER — Telehealth: Payer: Self-pay | Admitting: Oncology

## 2019-03-10 ENCOUNTER — Telehealth: Payer: Self-pay

## 2019-03-10 NOTE — Telephone Encounter (Signed)
Oral Oncology Patient Advocate Encounter  I received a call from the patient and he asked about his Xeloda and when it was coming. [facility] is going to mail that to him on 7/2 for 7/3 delivery.  He asked about his appointments and stated that his appointment for tomorrow is not correct.   I transferred him to Dr. Starleen Arms nurses line. I advised him to leave a message if needed.  The patient verbalized understanding and great appreciation.  [facility] Patient [place] Phone [phone] Fax [phone] 03/10/2019   11:06 AM

## 2019-03-10 NOTE — Telephone Encounter (Signed)
Called pt and let them know appt for 6/30 is cancelled - unable to reach and left message for pt not to come in and we will call them once the appt is rescheduled.

## 2019-03-11 ENCOUNTER — Inpatient Hospital Stay: Payer: Medicare Other | Admitting: Oncology

## 2019-03-11 ENCOUNTER — Inpatient Hospital Stay: Payer: Medicare Other

## 2019-03-13 ENCOUNTER — Other Ambulatory Visit: Payer: Self-pay

## 2019-03-13 ENCOUNTER — Ambulatory Visit (INDEPENDENT_AMBULATORY_CARE_PROVIDER_SITE_OTHER): Payer: Medicare Other | Admitting: Internal Medicine

## 2019-03-13 ENCOUNTER — Encounter: Payer: Self-pay | Admitting: Internal Medicine

## 2019-03-13 DIAGNOSIS — C161 Malignant neoplasm of fundus of stomach: Secondary | ICD-10-CM

## 2019-03-13 DIAGNOSIS — T451X5A Adverse effect of antineoplastic and immunosuppressive drugs, initial encounter: Secondary | ICD-10-CM

## 2019-03-13 DIAGNOSIS — I10 Essential (primary) hypertension: Secondary | ICD-10-CM

## 2019-03-13 DIAGNOSIS — C787 Secondary malignant neoplasm of liver and intrahepatic bile duct: Secondary | ICD-10-CM

## 2019-03-13 DIAGNOSIS — R634 Abnormal weight loss: Secondary | ICD-10-CM

## 2019-03-13 DIAGNOSIS — D6481 Anemia due to antineoplastic chemotherapy: Secondary | ICD-10-CM

## 2019-03-13 DIAGNOSIS — I2699 Other pulmonary embolism without acute cor pulmonale: Secondary | ICD-10-CM

## 2019-03-13 MED FILL — CAPECITABINE 500 MG TABS: 500 | 21 days supply | Qty: 56 | Fill #1

## 2019-03-13 NOTE — Assessment & Plan Note (Signed)
  Gemcitabine, liposomal paclitaxel (Abraxane) xeloda

## 2019-03-13 NOTE — Assessment & Plan Note (Signed)
CBC

## 2019-03-13 NOTE — Assessment & Plan Note (Signed)
Eliquis 

## 2019-03-13 NOTE — Assessment & Plan Note (Signed)
Gastric cancer Gemcitabine, liposomal paclitaxel (Abraxane) xeloda po

## 2019-03-13 NOTE — Progress Notes (Signed)
Subjective:  Patient ID: Peter Wood, male    DOB: 1935-05-30  Age: 84 y.o. MRN: [id number]  CC: No chief complaint on file.   HPI Peter Wood presents for pancreatic ca, DVT/PE, HTN f/u. On chemo  Outpatient Medications Prior to Visit  Medication Sig Dispense Refill  . amLODipine (NORVASC) 5 MG tablet TAKE ONE TABLET BY MOUTH ONE TIME DAILY  90 tablet 2  . apixaban (ELIQUIS) 2.5 MG TABS tablet Take 1 tablet (2.5 mg total) by mouth 2 (two) times daily. 60 tablet 12  . benazepril (LOTENSIN) 40 MG tablet TAKE ONE TABLET BY MOUTH ONE TIME DAILY  90 tablet 2  . capecitabine (XELODA) 500 MG tablet TAKE 2 TABLETS (1,000 MG TOTAL) BY MOUTH 2 (TWO) TIMES DAILY AFTER A MEAL. TAKE ON DAYS 1-14 OF CHEMOTHERAPY. REPEAT EVERY 21 DAYS. 56 tablet 4  . Cholecalciferol 1000 UNITS tablet Take 1,000 Units by mouth daily.     . ciprofloxacin (CIPRO) 500 MG tablet Take 1 tablet (500 mg total) by mouth 2 (two) times daily. 14 tablet 1  . latanoprost (XALATAN) 0.005 % ophthalmic solution Place 1 drop into both eyes at bedtime.     . lidocaine-prilocaine (EMLA) cream Apply to affected area once 30 g 3  . ondansetron (ZOFRAN) 4 MG tablet Take 1 tablet (4 mg total) by mouth every 8 (eight) hours as needed for nausea or vomiting. 20 tablet 1  . pantoprazole (PROTONIX) 40 MG tablet Take 1 tablet (40 mg total) by mouth 2 (two) times daily before a meal. 60 tablet 0  . prochlorperazine (COMPAZINE) 10 MG tablet Take 1 tablet (10 mg total) by mouth every 6 (six) hours as needed (Nausea or vomiting). 30 tablet 1   Facility-Administered Medications Prior to Visit  Medication Dose Route Frequency Provider Last Rate Last Dose  . sodium chloride flush (NS) 0.9 % injection 10 mL  10 mL Intravenous PRN Magrinat, Virgie Dad, MD        ROS: Review of Systems  Constitutional: Positive for fatigue and unexpected weight change. Negative for appetite change.  HENT: Negative for congestion, nosebleeds, sneezing, sore  throat and trouble swallowing.   Eyes: Negative for itching and visual disturbance.  Respiratory: Negative for cough.   Cardiovascular: Negative for chest pain, palpitations and leg swelling.  Gastrointestinal: Negative for abdominal distention, blood in stool, diarrhea and nausea.  Genitourinary: Negative for frequency and hematuria.  Musculoskeletal: Negative for back pain, gait problem, joint swelling and neck pain.  Skin: Positive for color change. Negative for rash.  Neurological: Negative for dizziness, tremors, speech difficulty and weakness.  Hematological: Does not bruise/bleed easily.  Psychiatric/Behavioral: Negative for agitation, dysphoric mood, sleep disturbance and suicidal ideas. The patient is not nervous/anxious.     Objective:  BP (!) 154/72 (BP Location: Left Arm, Patient Position: Sitting, Cuff Size: Normal)   Pulse 85   Temp 98.1 F (36.7 C) (Oral)   Ht 5' 4.5" (1.638 m)   Wt 143 lb (64.9 kg)   SpO2 99%   BMI 24.17 kg/m   BP Readings from Last 3 Encounters:  03/13/19 (!) 154/72  02/24/19 (!) 153/76  02/18/19 (!) 145/72    Wt Readings from Last 3 Encounters:  03/13/19 143 lb (64.9 kg)  02/18/19 142 lb 3.2 oz (64.5 kg)  01/28/19 140 lb 12.8 oz (63.9 kg)    Physical Exam Constitutional:      General: He is not in acute distress.    Appearance: He is  well-developed.     Comments: NAD  Eyes:     Conjunctiva/sclera: Conjunctivae normal.     Pupils: Pupils are equal, round, and reactive to light.  Neck:     Musculoskeletal: Normal range of motion.     Thyroid: No thyromegaly.     Vascular: No JVD.  Cardiovascular:     Rate and Rhythm: Normal rate and regular rhythm.     Heart sounds: Normal heart sounds. No murmur. No friction rub. No gallop.   Pulmonary:     Effort: Pulmonary effort is normal. No respiratory distress.     Breath sounds: Normal breath sounds. No wheezing or rales.  Chest:     Chest wall: No tenderness.  Abdominal:     General:  Bowel sounds are normal. There is no distension.     Palpations: Abdomen is soft. There is no mass.     Tenderness: There is no abdominal tenderness. There is no guarding or rebound.  Musculoskeletal: Normal range of motion.        General: No tenderness.  Lymphadenopathy:     Cervical: No cervical adenopathy.  Skin:    General: Skin is warm and dry.     Findings: Rash present.  Neurological:     Mental Status: He is alert and oriented to person, place, and time.     Cranial Nerves: No cranial nerve deficit.     Motor: No abnormal muscle tone.     Coordination: Coordination normal.     Gait: Gait normal.     Deep Tendon Reflexes: Reflexes are normal and symmetric.  Psychiatric:        Behavior: Behavior normal.        Thought Content: Thought content normal.        Judgment: Judgment normal.   dry hyperpigmented skin on fingers  Lab Results  Component Value Date   WBC 5.0 02/24/2019   HGB 9.0 (L) 02/24/2019   HCT 28.1 (L) 02/24/2019   PLT 259 02/24/2019   GLUCOSE 88 02/24/2019   CHOL 193 01/18/2018   TRIG 84.0 01/18/2018   HDL 50.50 01/18/2018   LDLDIRECT 126.3 09/23/2007   LDLCALC 126 (H) 01/18/2018   ALT 22 02/24/2019   AST 24 02/24/2019   NA 140 02/24/2019   K 3.9 02/24/2019   CL 104 02/24/2019   CREATININE 0.95 02/24/2019   BUN 11 02/24/2019   CO2 26 02/24/2019   TSH 0.93 01/18/2018   PSA 0.54 09/18/2018   INR 1.4 11/04/2018    Dg Chest 2 View  Result Date: 02/18/2019 CLINICAL DATA:  Patient with fever. EXAM: CHEST - 2 VIEW COMPARISON:  Chest CT 09/19/2018 FINDINGS: Right anterior chest wall Port-A-Cath is present with tip projecting over the superior vena cava. Stable cardiac and mediastinal contours. Tortuosity of the thoracic aorta. No large area pulmonary consolidation. No pleural effusion or pneumothorax. Regional skeleton is unremarkable. IMPRESSION: No acute cardiopulmonary process. Electronically Signed   By: Lovey Newcomer M.D.   On: 02/18/2019 16:25     Assessment & Plan:   There are no diagnoses linked to this encounter.   No orders of the defined types were placed in this encounter.    Follow-up: No follow-ups on file.  Walker Kehr, MD

## 2019-03-13 NOTE — Assessment & Plan Note (Signed)
Norvasc, Benazepril

## 2019-03-13 NOTE — Assessment & Plan Note (Signed)
Wt Readings from Last 3 Encounters:  03/13/19 143 lb (64.9 kg)  02/18/19 142 lb 3.2 oz (64.5 kg)  01/28/19 140 lb 12.8 oz (63.9 kg)

## 2019-03-17 ENCOUNTER — Telehealth: Payer: Self-pay | Admitting: Oncology

## 2019-03-17 NOTE — Telephone Encounter (Signed)
Called pt about 7/08 appt - unable to reach pt  - left message with appt date and time

## 2019-03-18 NOTE — Progress Notes (Signed)
[facility]  [phone] [phone]     ID: Peter Wood DOB: 06/06/1935  MR#: [id number]  CSN#:[id number]  Patient Care Team: Wood, Peter V, MD as PCP - General Wood, Peter P, MD as Attending Physician (Gastroenterology) Magrinat, Peter C, MD as Consulting Physician (Oncology) Gessner, Peter E, MD as Consulting Physician (Gastroenterology) Wood, Peter D, MD as Consulting Physician (Ophthalmology) Wood, Peter Robert, MD as Referring Physician (Radiation Oncology) Wood, Peter S, MD as Consulting Physician (Cardiology) OTHER MD:   CHIEF COMPLAINT: Metastatic gastric adenocarcinoma  CURRENT TREATMENT: oxaliplatin, capecitabine, trastuzumab   INTERVAL HISTORY: Peter Wood was seen today for follow-up and treatment of his metastatic gastric adenocarcinoma.   He continues on oxaliplatin, capecitabine and trastuzumab. Today is day 1 cycle 7. He tolerates this well and without any noticeable side effects.  In fact he tells me he is eating more, with no gastric distress at all.  He is gaining weight.  Flu test is fine.  He started the capecitabine this morning.  He has some skin darkening as his only side effect.  We are following his tumor markers: These show a gratifying response Results for Wood, Peter L (MRN [id number]) as of 03/19/2019 11:36  Ref. Range 11/13/2018 10:52 12/18/2018 10:16 01/07/2019 10:10 01/28/2019 10:46 02/18/2019 10:29  CA 19-9 Latest Ref Range: 0 - 35 U/mL 7,694 (H) 976 (H) 416 (H) 185 (H) 108 (H)  CEA ([facility]) Latest Ref Range: 0.00 - 5.00 ng/mL   61.52 (H) 30.78 (H) 15.24 (H)    We are following his tumor marker: Lab Results  Component Value Date   CAN199 108 (H) 02/18/2019   CAN199 185 (H) 01/28/2019   CAN199 416 (H) 01/07/2019   CAN199 976 (H) 12/18/2018   CAN199 7,694 (H) 11/13/2018   His CEA also has decreased: Lab Results  Component Value Date   CEA1 15.24 (H) 02/18/2019   CEA1 30.78 (H)  01/28/2019   CEA1 61.52 (H) 01/07/2019   CEA1 1,237.70 (H) 11/06/2018    REVIEW OF SYSTEMS: Peter Wood had a fever which was treated with Cipro and cleared.  He had a temperature yesterday as well.  Today however he has been afebrile.  At no point did he have any symptoms related to this including no cough no diarrhea no feeling tired, no headache, no visual changes, no nausea or vomiting, and also no dysuria or hematuria.  In fact he feels "better than normal".  Detailed review of systems today was otherwise noncontributory   HISTORY OF CURRENT ILLNESS: From the original intake note:  Peter Wood presented to the emergency room 09/07/2018 with mid/upper abdominal pain.  He reported weight loss of 25 pounds over the past 6 months and significant anorexia.  His liver function tests were abnormal and a right upper quadrant ultrasound was obtained.  This was read as concerning for malignancy and a CT of the abdomen and pelvis with contrast was obtained the same day, showing  1. Too numerous to count hypodense masses of the liver concerning for hepatic metastasis. In the region of the porta hepatis, there are hypodense lesions more likely associated with the liver though the pancreatic head is partially obscured by a 3.2 cm hypodense mass. Findings are more likely associated with the liver and less likely an isolated pancreatic mass. 2. Upper abdominal mesenteric and peripancreatic lymphadenopathy suspicious for metastatic disease. 3. 6 mm sclerotic density in the left iliac bone adjacent to the SI joint. Given history of prostate cancer, differential   considerations would include osteoblastic metastasis or possibly bone island. Showed only diverticular disease.  He was referred to GI and Dr. Ardis Wood notes a colonoscopy performed 04/23/2017 makes the possibility of colon cancer unlikely.  Further work-up was planned, but on 09/19/2018 the patient again presented to the emergency room with chest pain,  now in a different area, and a staging CT scan of the chest showed filling defects in the right lower lobe pulmonary arteries consistent with embolism.  There was a small pericardial effusion and an additional 2.4 cm left thyroid mass.  Aortic atherosclerosis was noted  He was started on intravenous heparin and on 09/20/2018 underwent upper endoscopy under Dr. Carlean Wood showing a giant nonbleeding cratered gastric ulcer in the posterior wall of the stomach.  Biopsies of this procedure showed (SZA 20-187) adenocarcinoma.   At that point we were consulted and we obtained tumor markers which included a CEA of 1318.2, a CA 19-9 of 67,479, a normal AFP and a normal PSA.  I discussed the case with pathology and they do not feel that additional testing would be helpful in terms of discriminating between a primary gastric and a primary biliary/pancreatic tumor.  The patient's subsequent history is as detailed below.   PAST MEDICAL HISTORY: Past Medical History:  Diagnosis Date  . Arthritis   . Diverticulosis of colon   . Family history of breast cancer   . Family history of prostate cancer   . HTN (hypertension)   . Hx of colonic polyp   . Pneumonia    as a child/baby  . Poor circulation of extremity    right leg  . Prostate cancer ([place]) 2015   prostate cancer - radiation treated     PAST SURGICAL HISTORY: Past Surgical History:  Procedure Laterality Date  . [place] VITRECTOMY WITH 20 GAUGE MVR PORT FOR MACULAR HOLE Right 09/19/2016   Procedure: 25 GAUGE PARS PLANA VITRECTOMY WITH 20 GAUGE MVR PORT FOR MACULAR HOLE, MEMBRANE PEEL, SERUM PATCH, GAS FLUID EXCHANGE, HEADSCOPE LASER;  Surgeon: Peter Pedro, MD;  Location: [facility];  Service: Ophthalmology;  Laterality: Right;  . BIOPSY  09/20/2018   Procedure: BIOPSY;  Surgeon: Peter Mayer, MD;  Location: [facility] ENDOSCOPY;  Service: Endoscopy;;  . CATARACT EXTRACTION  08/27/12   Right  . COLONOSCOPY    . ESOPHAGOGASTRODUODENOSCOPY  (EGD) WITH PROPOFOL N/A 09/20/2018   Procedure: ESOPHAGOGASTRODUODENOSCOPY (EGD) WITH PROPOFOL;  Surgeon: Peter Mayer, MD;  Location: [facility];  Service: Endoscopy;  Laterality: N/A;  . HAND SURGERY  2016   right thumb  . IR IMAGING GUIDED PORT INSERTION  11/04/2018  . KNEE SURGERY Left 1989     FAMILY HISTORY: Family History  Problem Relation Age of Onset  . Prostate cancer Father        dx >50  . Hypertension Mother   . Ulcers Mother   . Breast cancer Sister        dx 20's  . Prostate cancer Brother        dx under 27, metasttic, cause of his death  . Ulcers Brother   . Ulcers Sister    Baine's father died from heart complications with a pacemaker at age 68; Seith's father also has prostate cancer. Patients' mother died from natural causes at age 41. The patient has 4 brothers and 3 sisters. One of [place] sisters had breast cancer in her 28s and a brother had prostate cancer. Patient denies anyone in her family having ovarian,  or pancreatic cancer. Zerick mentions that his youngest sister and his brother with prostate cancer also had stomach ulcers.    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 His wife, [name], is a retired [employer] summer Radiographer, therapeutic. As of 09/2018, they have been married for 56 years. They have 4 children, Claiborne Billings, [place], S[place]ia, and Annabella. Claiborne Billings lives in [place] and works at the [employer]. Sherrod lives in [place] and works at [employer] and as a part Horticulturist, commercial. Germane lives in [place] and is a Engineer, drilling for a medical company. Beverely Low lives in [place] and works at [employer]. Kiron has 7 grandchildren and 1 step great grandchild. He attends [employer].     ADVANCED DIRECTIVES: His wife, [name], is automatically his healthcare power of attorney.     HEALTH MAINTENANCE: Social History   Tobacco Use  . Smoking status: Former Smoker    Years: 4.00    Quit date: 09/12/1967    Years since  quitting: 51.5  . Smokeless tobacco: Never Used  Substance Use Topics  . Alcohol use: No  . Drug use: No    Colonoscopy: 2018/Wood  PSA: 0.54 on 09/18/2018  No Known Allergies  Current Outpatient Medications  Medication Sig Dispense Refill  . amLODipine (NORVASC) 5 MG tablet TAKE ONE TABLET BY MOUTH ONE TIME DAILY  90 tablet 2  . apixaban (ELIQUIS) 2.5 MG TABS tablet Take 1 tablet (2.5 mg total) by mouth 2 (two) times daily. 60 tablet 12  . benazepril (LOTENSIN) 40 MG tablet TAKE ONE TABLET BY MOUTH ONE TIME DAILY  90 tablet 2  . capecitabine (XELODA) 500 MG tablet TAKE 2 TABLETS (1,000 MG TOTAL) BY MOUTH 2 (TWO) TIMES DAILY AFTER A MEAL. TAKE ON DAYS 1-14 OF CHEMOTHERAPY. REPEAT EVERY 21 DAYS. 56 tablet 4  . Cholecalciferol 1000 UNITS tablet Take 1,000 Units by mouth daily.     . ciprofloxacin (CIPRO) 500 MG tablet Take 1 tablet (500 mg total) by mouth 2 (two) times daily. 14 tablet 1  . latanoprost (XALATAN) 0.005 % ophthalmic solution Place 1 drop into both eyes at bedtime.     . lidocaine-prilocaine (EMLA) cream Apply to affected area once 30 g 3  . ondansetron (ZOFRAN) 4 MG tablet Take 1 tablet (4 mg total) by mouth every 8 (eight) hours as needed for nausea or vomiting. 20 tablet 1  . pantoprazole (PROTONIX) 40 MG tablet Take 1 tablet (40 mg total) by mouth 2 (two) times daily before a meal. 60 tablet 0  . prochlorperazine (COMPAZINE) 10 MG tablet Take 1 tablet (10 mg total) by mouth every 6 (six) hours as needed (Nausea or vomiting). 30 tablet 1   No current facility-administered medications for this visit.    Facility-Administered Medications Ordered in Other Visits  Medication Dose Route Frequency Provider Last Rate Last Dose  . sodium chloride flush (NS) 0.9 % injection 10 mL  10 mL Intravenous PRN , Virgie Dad, MD         OBJECTIVE: Elderly African-American man in no acute distress Vitals:   03/19/19 1137  BP: (!) 143/81  Pulse: 77  Resp: 18  Temp: 99.1 F  (37.3 Wood)  SpO2: 100%     Body mass index is 24.49 kg/m.   Wt Readings from Last 3 Encounters:  03/19/19 144 lb 14.4 oz (65.7 kg)  03/13/19 143 lb (64.9 kg)  02/18/19 142 lb 3.2 oz (64.5 kg)  ECOG FS:1 - Symptomatic but completely  ambulatory  Sclerae unicteric, pupils round and equal Wearing a mask No cervical or supraclavicular adenopathy Lungs no rales or rhonchi Heart regular rate and rhythm Abd soft, nontender, positive bowel sounds MSK no focal spinal tenderness, no upper extremity lymphedema Neuro: nonfocal, well oriented, appropriate affect   LAB RESULTS:  CMP     Component Value Date/Time   NA 140 02/24/2019 0849   K 3.9 02/24/2019 0849   CL 104 02/24/2019 0849   CO2 26 02/24/2019 0849   GLUCOSE 88 02/24/2019 0849   GLUCOSE 105 (H) 09/13/2006 0900   BUN 11 02/24/2019 0849   CREATININE 0.95 02/24/2019 0849   CREATININE 1.02 10/16/2018 0936   CALCIUM 8.7 (L) 02/24/2019 0849   PROT 6.5 02/24/2019 0849   ALBUMIN 3.3 (L) 02/24/2019 0849   AST 24 02/24/2019 0849   AST 76 (H) 10/16/2018 0936   ALT 22 02/24/2019 0849   ALT 47 (H) 10/16/2018 0936   ALKPHOS 95 02/24/2019 0849   BILITOT 0.5 02/24/2019 0849   BILITOT 0.7 10/16/2018 0936   GFRNONAA >60 02/24/2019 0849   GFRNONAA >60 10/16/2018 0936   GFRAA >60 02/24/2019 0849   GFRAA >60 10/16/2018 0936    No results found for: TOTALPROTELP, ALBUMINELP, A1GS, A2GS, BETS, BETA2SER, GAMS, MSPIKE, SPEI  No results found for: KPAFRELGTCHN, LAMBDASER, KAPLAMBRATIO  Lab Results  Component Value Date   WBC 7.5 03/19/2019   NEUTROABS 5.0 03/19/2019   HGB 9.0 (L) 03/19/2019   HCT 27.7 (L) 03/19/2019   MCV 98.2 03/19/2019   PLT 178 03/19/2019    _0 @  No results found for: LABCA2  No components found for: DGLOVF643  No results for input(s): INR in the last 168 hours.  No results found for: LABCA2  Lab Results  Component Value Date   CAN199 108 (H) 02/18/2019    No results found for: PIR518   No results found for: ACZ660  No results found for: CA2729  No components found for: HGQUANT  Lab Results  Component Value Date   CEA1 15.24 (H) 02/18/2019   /  CEA ([facility])  Date Value Ref Range Status  02/18/2019 15.24 (H) 0.00 - 5.00 ng/mL Final    Comment:    (NOTE) This test was performed using Architect's Chemiluminescent Microparticle Immunoassay. Values obtained from different assay methods cannot be used interchangeably. Please note that 5-10% of patients who smoke may see CEA levels up to 6.9 ng/mL. Performed at [facility], [place] [address] [place], [place] [address]      No results found for: AFPTUMOR  No results found for: [place]  No results found for: PSA1  Appointment on 03/19/2019  Component Date Value Ref Range Status  . WBC 03/19/2019 7.5  4.0 - 10.5 K/uL Final  . RBC 03/19/2019 2.82* 4.22 - 5.81 MIL/uL Final  . Hemoglobin 03/19/2019 9.0* 13.0 - 17.0 g/dL Final  . HCT 03/19/2019 27.7* 39.0 - 52.0 % Final  . MCV 03/19/2019 98.2  80.0 - 100.0 fL Final  . MCH 03/19/2019 31.9  26.0 - 34.0 pg Final  . MCHC 03/19/2019 32.5  30.0 - 36.0 g/dL Final  . RDW 03/19/2019 22.6* 11.5 - 15.5 % Final  . Platelets 03/19/2019 178  150 - 400 K/uL Final  . nRBC 03/19/2019 0.0  0.0 - 0.2 % Final  . Neutrophils Relative % 03/19/2019 67  % Final  . Neutro Abs 03/19/2019 5.0  1.7 - 7.7 K/uL Final  . Lymphocytes Relative 03/19/2019 16  %  Final  . Lymphs Abs 03/19/2019 1.2  0.7 - 4.0 K/uL Final  . Monocytes Relative 03/19/2019 15  % Final  . Monocytes Absolute 03/19/2019 1.1* 0.1 - 1.0 K/uL Final  . Eosinophils Relative 03/19/2019 1  % Final  . Eosinophils Absolute 03/19/2019 0.1  0.0 - 0.5 K/uL Final  . Basophils Relative 03/19/2019 1  % Final  . Basophils Absolute 03/19/2019 0.1  0.0 - 0.1 K/uL Final  . Immature Granulocytes 03/19/2019 0  % Final  . Abs Immature Granulocytes 03/19/2019 0.03  0.00 - 0.07 K/uL Final   Performed at [facility], [place] [address] [place], [place] [address]    (this displays the last labs from the last 3 days)  No results found for: TOTALPROTELP, ALBUMINELP, A1GS, A2GS, BETS, BETA2SER, GAMS, MSPIKE, SPEI (this displays SPEP labs)  No results found for: KPAFRELGTCHN, LAMBDASER, KAPLAMBRATIO (kappa/lambda light chains)  No results found for: HGBA, HGBA2QUANT, HGBFQUANT, HGBSQUAN (Hemoglobinopathy evaluation)   No results found for: LDH  Lab Results  Component Value Date   IRON 38 (L) 09/21/2018   TIBC 251 09/21/2018   IRONPCTSAT 15 (L) 09/21/2018   (Iron and TIBC)  Lab Results  Component Value Date   FERRITIN 138 09/21/2018    Urinalysis    Component Value Date/Time   COLORURINE YELLOW 02/18/2019 1056   APPEARANCEUR CLEAR 02/18/2019 1056   LABSPEC 1.018 02/18/2019 1056   PHURINE 6.0 02/18/2019 1056   GLUCOSEU NEGATIVE 02/18/2019 1056   GLUCOSEU NEGATIVE 07/20/2017 1056   HGBUR NEGATIVE 02/18/2019 [place] 02/18/2019 [place] 02/18/2019 1056   PROTEINUR 30 (A) 02/18/2019 1056   UROBILINOGEN 0.2 07/20/2017 1056   NITRITE NEGATIVE 02/18/2019 [place] 02/18/2019 1056     STUDIES:  Dg Chest 2 View  Result Date: 02/18/2019 CLINICAL DATA:  Patient with fever. EXAM: CHEST - 2 VIEW COMPARISON:  Chest CT 09/19/2018 FINDINGS: Right anterior chest wall Port-A-Cath is present with tip projecting over the superior vena cava. Stable cardiac and mediastinal contours. Tortuosity of the thoracic aorta. No large area pulmonary consolidation. No pleural effusion or pneumothorax. Regional skeleton is unremarkable. IMPRESSION: No acute cardiopulmonary process. Electronically Signed   By: Lovey Newcomer M.D.   On: 02/18/2019 16:25     ELIGIBLE FOR AVAILABLE RESEARCH PROTOCOL: no   ASSESSMENT: 84 y.o. [place], [place] man status post gastric biopsy 09/20/2018 showing adenocarcinoma, with a CA 19-9 greater than  65,000; staging studies showing an apparent mass adjacent to the head of the pancreas, innumerable liver lesions, upper abdominal mesenteric and peripancreatic lymphadenopathy, and an isolated sclerotic density in the left iliac bone  (a) genomics requested on gastric biopsy showed the tumor to be HER-2 amplified at 3+, PD-L1 positive, and T p53 mutated.  MSI is stable, mismatch repair status is proficient and the tumor mutational burden is intermediate.  (1) chest CT scan 09/19/2018 shows right lower lobe pulmonary embolism  (a) on intravenous heparin started 09/19/2018, transitioned to apixaban 10/01/2018  (2) genetics testing 10/04/2018: negative  (a) family history of prostate and early breast cancer; patient has a history of prostate cancer  (3) started gemcitabine/Abraxane 10/09/2018, repeated days 1, 8 and 15 of each 28-day cycle  (a) stopped after 1 cycle (3 doses) with reassessment of diagnosis based on CARIS results, noted above  (4) to start oxaliplatin, capecitabine and trastuzumab 11/06/2018  (a) baseline echocardiogram 09/20/2018 shows an ejection fraction in the 55-60% range  (b) oxaliplatin  and trastuzumab every 21 days started 11/06/2018  (Wood) capecitabine dose 1000 mg twice daily for 14 days of every 21-day cycle  (d) CT of the abdomen and pelvis 02/11/2019 shows marked improvement in his liver lesions and resolution of periportal adenopathy  (Wood) tumor markers also show significant response,  (f) echocardiogram 02/12/2019 shows an ejection fraction in the 60-65% range   PLAN: Eliceo is tolerating chemotherapy well and is having an excellent response to it he will proceed to cycle #7 today.  I am not sure what the source of his intermittent fevers are.  They are not accompanied by any symptoms.  At this point we are simply following.  He was tested for the novel coronavirus on 02/18/2019 and was negative  I have scheduled him for 2 more chemotherapy cycles and then a  restaging CT scan.  Of course we are continuing to follow his tumor markers.  If all goes well after that and beginning on September 8 we will switch him to trastuzumab maintenance, which we will continue indefinitely.   Peter Wood. Magrinat, MD 03/19/19 11:57 AM Medical Oncology and Hematology [facility] [address] [place], [place] [address] Tel. [phone]    Fax. [phone]    I, Katie Daubenspeck, am acting as scribe for Dr. Gustav Wood. Magrinat.  I, Peter Magrinat MD, have reviewed the above documentation for accuracy and completeness, and I agree with the above.    

## 2019-03-19 ENCOUNTER — Inpatient Hospital Stay: Payer: Medicare Other | Attending: Oncology

## 2019-03-19 ENCOUNTER — Inpatient Hospital Stay: Payer: Medicare Other

## 2019-03-19 ENCOUNTER — Inpatient Hospital Stay (HOSPITAL_BASED_OUTPATIENT_CLINIC_OR_DEPARTMENT_OTHER): Payer: Medicare Other | Admitting: Oncology

## 2019-03-19 ENCOUNTER — Other Ambulatory Visit: Payer: Self-pay

## 2019-03-19 VITALS — BP 143/81 | HR 77 | Temp 99.1°F | Resp 18 | Ht 64.5 in | Wt 144.9 lb

## 2019-03-19 DIAGNOSIS — Z5111 Encounter for antineoplastic chemotherapy: Secondary | ICD-10-CM | POA: Diagnosis not present

## 2019-03-19 DIAGNOSIS — C169 Malignant neoplasm of stomach, unspecified: Secondary | ICD-10-CM

## 2019-03-19 DIAGNOSIS — C799 Secondary malignant neoplasm of unspecified site: Secondary | ICD-10-CM

## 2019-03-19 DIAGNOSIS — C250 Malignant neoplasm of head of pancreas: Secondary | ICD-10-CM

## 2019-03-19 DIAGNOSIS — Z8546 Personal history of malignant neoplasm of prostate: Secondary | ICD-10-CM | POA: Insufficient documentation

## 2019-03-19 DIAGNOSIS — Z79899 Other long term (current) drug therapy: Secondary | ICD-10-CM | POA: Diagnosis not present

## 2019-03-19 DIAGNOSIS — Z86711 Personal history of pulmonary embolism: Secondary | ICD-10-CM | POA: Insufficient documentation

## 2019-03-19 DIAGNOSIS — C7951 Secondary malignant neoplasm of bone: Secondary | ICD-10-CM

## 2019-03-19 DIAGNOSIS — C168 Malignant neoplasm of overlapping sites of stomach: Secondary | ICD-10-CM

## 2019-03-19 DIAGNOSIS — C787 Secondary malignant neoplasm of liver and intrahepatic bile duct: Secondary | ICD-10-CM | POA: Diagnosis not present

## 2019-03-19 DIAGNOSIS — Z7901 Long term (current) use of anticoagulants: Secondary | ICD-10-CM | POA: Diagnosis not present

## 2019-03-19 DIAGNOSIS — Z95828 Presence of other vascular implants and grafts: Secondary | ICD-10-CM

## 2019-03-19 DIAGNOSIS — D649 Anemia, unspecified: Secondary | ICD-10-CM

## 2019-03-19 LAB — CBC WITH DIFFERENTIAL/PLATELET
Abs Immature Granulocytes: 0.03 10*3/uL (ref 0.00–0.07)
Basophils Absolute: 0.1 10*3/uL (ref 0.0–0.1)
Basophils Relative: 1 %
Eosinophils Absolute: 0.1 10*3/uL (ref 0.0–0.5)
Eosinophils Relative: 1 %
HCT: 27.7 % — ABNORMAL LOW (ref 39.0–52.0)
Hemoglobin: 9 g/dL — ABNORMAL LOW (ref 13.0–17.0)
Immature Granulocytes: 0 %
Lymphocytes Relative: 16 %
Lymphs Abs: 1.2 10*3/uL (ref 0.7–4.0)
MCH: 31.9 pg (ref 26.0–34.0)
MCHC: 32.5 g/dL (ref 30.0–36.0)
MCV: 98.2 fL (ref 80.0–100.0)
Monocytes Absolute: 1.1 10*3/uL — ABNORMAL HIGH (ref 0.1–1.0)
Monocytes Relative: 15 %
Neutro Abs: 5 10*3/uL (ref 1.7–7.7)
Neutrophils Relative %: 67 %
Platelets: 178 10*3/uL (ref 150–400)
RBC: 2.82 MIL/uL — ABNORMAL LOW (ref 4.22–5.81)
RDW: 22.6 % — ABNORMAL HIGH (ref 11.5–15.5)
WBC: 7.5 10*3/uL (ref 4.0–10.5)
nRBC: 0 % (ref 0.0–0.2)

## 2019-03-19 LAB — COMPREHENSIVE METABOLIC PANEL
ALT: 17 U/L (ref 0–44)
AST: 26 U/L (ref 15–41)
Albumin: 3.3 g/dL — ABNORMAL LOW (ref 3.5–5.0)
Alkaline Phosphatase: 101 U/L (ref 38–126)
Anion gap: 7 (ref 5–15)
BUN: 12 mg/dL (ref 8–23)
CO2: 28 mmol/L (ref 22–32)
Calcium: 8.4 mg/dL — ABNORMAL LOW (ref 8.9–10.3)
Chloride: 103 mmol/L (ref 98–111)
Creatinine, Ser: 0.91 mg/dL (ref 0.61–1.24)
GFR calc Af Amer: >60 mL/min (ref >60)
GFR calc non Af Amer: >60 mL/min (ref >60)
Glucose, Bld: 98 mg/dL (ref 70–99)
Potassium: 3.7 mmol/L (ref 3.5–5.1)
Sodium: 138 mmol/L (ref 135–145)
Total Bilirubin: 0.8 mg/dL (ref 0.3–1.2)
Total Protein: 6.3 g/dL — ABNORMAL LOW (ref 6.5–8.1)

## 2019-03-19 LAB — CEA (IN HOUSE-CHCC): CEA (CHCC-In House): 8.93 ng/mL — ABNORMAL HIGH (ref 0.00–5.00)

## 2019-03-19 MED ORDER — ACETAMINOPHEN 325 MG PO TABS
650.0000 mg | ORAL_TABLET | Freq: Once | ORAL | Status: AC
  Administered 2019-03-19: 650 mg via ORAL

## 2019-03-19 MED ORDER — OXALIPLATIN CHEMO INJECTION 100 MG/20ML
85.0000 mg/m2 | Freq: Once | INTRAVENOUS | Status: AC
  Administered 2019-03-19: 140 mg via INTRAVENOUS
  Filled 2019-03-19: qty 20

## 2019-03-19 MED ORDER — SODIUM CHLORIDE 0.9 % IV SOLN
Freq: Once | INTRAVENOUS | Status: AC
  Administered 2019-03-19: 13:00:00 via INTRAVENOUS
  Filled 2019-03-19: qty 250

## 2019-03-19 MED ORDER — DEXTROSE 5 % IV SOLN
Freq: Once | INTRAVENOUS | Status: AC
  Administered 2019-03-19: 14:00:00 via INTRAVENOUS
  Filled 2019-03-19: qty 250

## 2019-03-19 MED ORDER — PALONOSETRON HCL INJECTION 0.25 MG/5ML
0.2500 mg | Freq: Once | INTRAVENOUS | Status: AC
  Administered 2019-03-19: 0.25 mg via INTRAVENOUS

## 2019-03-19 MED ORDER — SODIUM CHLORIDE 0.9 % IV SOLN
Freq: Once | INTRAVENOUS | Status: AC
  Administered 2019-03-19: 13:00:00 via INTRAVENOUS
  Filled 2019-03-19: qty 5

## 2019-03-19 MED ORDER — HEPARIN SOD (PORK) LOCK FLUSH 100 UNIT/ML IV SOLN
500.0000 [IU] | Freq: Once | INTRAVENOUS | Status: AC | PRN
  Administered 2019-03-19: 500 [IU]
  Filled 2019-03-19: qty 5

## 2019-03-19 MED ORDER — PEGFILGRASTIM 6 MG/0.6ML ~~LOC~~ PSKT
PREFILLED_SYRINGE | SUBCUTANEOUS | Status: AC
  Filled 2019-03-19: qty 0.6

## 2019-03-19 MED ORDER — PALONOSETRON HCL INJECTION 0.25 MG/5ML
INTRAVENOUS | Status: AC
  Filled 2019-03-19: qty 5

## 2019-03-19 MED ORDER — PEGFILGRASTIM 6 MG/0.6ML ~~LOC~~ PSKT
6.0000 mg | PREFILLED_SYRINGE | Freq: Once | SUBCUTANEOUS | Status: AC
  Administered 2019-03-19: 17:00:00 6 mg via SUBCUTANEOUS

## 2019-03-19 MED ORDER — TRASTUZUMAB CHEMO 150 MG IV SOLR
6.0000 mg/kg | Freq: Once | INTRAVENOUS | Status: AC
  Administered 2019-03-19: 357 mg via INTRAVENOUS
  Filled 2019-03-19: qty 17

## 2019-03-19 MED ORDER — DIPHENHYDRAMINE HCL 25 MG PO CAPS
ORAL_CAPSULE | ORAL | Status: AC
  Filled 2019-03-19: qty 1

## 2019-03-19 MED ORDER — SODIUM CHLORIDE 0.9% FLUSH
10.0000 mL | INTRAVENOUS | Status: DC | PRN
  Administered 2019-03-19: 10 mL
  Filled 2019-03-19: qty 10

## 2019-03-19 MED ORDER — DIPHENHYDRAMINE HCL 25 MG PO CAPS
25.0000 mg | ORAL_CAPSULE | Freq: Once | ORAL | Status: AC
  Administered 2019-03-19: 25 mg via ORAL

## 2019-03-19 MED ORDER — ACETAMINOPHEN 325 MG PO TABS
ORAL_TABLET | ORAL | Status: AC
  Filled 2019-03-19: qty 2

## 2019-03-19 MED ORDER — SODIUM CHLORIDE 0.9% FLUSH
10.0000 mL | Freq: Once | INTRAVENOUS | Status: AC
  Administered 2019-03-19: 10 mL
  Filled 2019-03-19: qty 10

## 2019-03-19 NOTE — Patient Instructions (Signed)
[facility] Discharge Instructions for Patients Receiving Chemotherapy  Today you received the following chemotherapy agents Oxaliplatin (ELOXATIN) & Trastuzumab (HERCEPTIN).  To help prevent nausea and vomiting after your treatment, we encourage you to take your nausea medication as prescribed.  If you develop nausea and vomiting that is not controlled by your nausea medication, call the clinic.   BELOW ARE SYMPTOMS THAT SHOULD BE REPORTED IMMEDIATELY:  *FEVER GREATER THAN 100.5 F  *CHILLS WITH OR WITHOUT FEVER  NAUSEA AND VOMITING THAT IS NOT CONTROLLED WITH YOUR NAUSEA MEDICATION  *UNUSUAL SHORTNESS OF BREATH  *UNUSUAL BRUISING OR BLEEDING  TENDERNESS IN MOUTH AND THROAT WITH OR WITHOUT PRESENCE OF ULCERS  *URINARY PROBLEMS  *BOWEL PROBLEMS  UNUSUAL RASH Items with * indicate a potential emergency and should be followed up as soon as possible.  Feel free to call the clinic should you have any questions or concerns. The clinic phone number is [phone].  Please show the [facility] at check-in to the Emergency Department and triage nurse.  Pegfilgrastim injection (Neulasta On-Pro) What is this medicine? PEGFILGRASTIM (PEG fil gra stim) is a long-acting granulocyte colony-stimulating factor that stimulates the growth of neutrophils, a type of white blood cell important in the body's fight against infection. It is used to reduce the incidence of fever and infection in patients with certain types of cancer who are receiving chemotherapy that affects the bone marrow, and to increase survival after being exposed to high doses of radiation. This medicine may be used for other purposes; ask your health care provider or pharmacist if you have questions. COMMON BRAND NAME(S): Steve Rattler, Ziextenzo What should I tell my health care provider before I take this medicine? They need to know if you have any of these conditions:  kidney  disease  latex allergy  ongoing radiation therapy  sickle cell disease  skin reactions to acrylic adhesives (On-Body Injector only)  an unusual or allergic reaction to pegfilgrastim, filgrastim, other medicines, foods, dyes, or preservatives  pregnant or trying to get pregnant  breast-feeding How should I use this medicine? This medicine is for injection under the skin. If you get this medicine at home, you will be taught how to prepare and give the pre-filled syringe or how to use the On-body Injector. Refer to the patient Instructions for Use for detailed instructions. Use exactly as directed. Tell your healthcare provider immediately if you suspect that the On-body Injector may not have performed as intended or if you suspect the use of the On-body Injector resulted in a missed or partial dose. It is important that you put your used needles and syringes in a special sharps container. Do not put them in a trash can. If you do not have a sharps container, call your pharmacist or healthcare provider to get one. Talk to your pediatrician regarding the use of this medicine in children. While this drug may be prescribed for selected conditions, precautions do apply. Overdosage: If you think you have taken too much of this medicine contact a poison control center or emergency room at once. NOTE: This medicine is only for you. Do not share this medicine with others. What if I miss a dose? It is important not to miss your dose. Call your doctor or health care professional if you miss your dose. If you miss a dose due to an On-body Injector failure or leakage, a new dose should be administered as soon as possible using a single prefilled syringe for  manual use. What may interact with this medicine? Interactions have not been studied. Give your health care provider a list of all the medicines, herbs, non-prescription drugs, or dietary supplements you use. Also tell them if you smoke, drink alcohol,  or use illegal drugs. Some items may interact with your medicine. This list may not describe all possible interactions. Give your health care provider a list of all the medicines, herbs, non-prescription drugs, or dietary supplements you use. Also tell them if you smoke, drink alcohol, or use illegal drugs. Some items may interact with your medicine. What should I watch for while using this medicine? You may need blood work done while you are taking this medicine. If you are going to need a MRI, CT scan, or other procedure, tell your doctor that you are using this medicine (On-Body Injector only). What side effects may I notice from receiving this medicine? Side effects that you should report to your doctor or health care professional as soon as possible:  allergic reactions like skin rash, itching or hives, swelling of the face, lips, or tongue  back pain  dizziness  fever  pain, redness, or irritation at site where injected  pinpoint red spots on the skin  red or dark-brown urine  shortness of breath or breathing problems  stomach or side pain, or pain at the shoulder  swelling  tiredness  trouble passing urine or change in the amount of urine Side effects that usually do not require medical attention (report to your doctor or health care professional if they continue or are bothersome):  bone pain  muscle pain This list may not describe all possible side effects. Call your doctor for medical advice about side effects. You may report side effects to FDA at 1-800-FDA-1088. Where should I keep my medicine? Keep out of the reach of children. If you are using this medicine at home, you will be instructed on how to store it. Throw away any unused medicine after the expiration date on the label. NOTE: This sheet is a summary. It may not cover all possible information. If you have questions about this medicine, talk to your doctor, pharmacist, or health care provider.  2020  Elsevier/Gold Standard (2017-12-03 16:57:08)

## 2019-03-20 ENCOUNTER — Telehealth: Payer: Self-pay | Admitting: Oncology

## 2019-03-20 LAB — CANCER ANTIGEN 19-9: CA 19-9: 61 U/mL — ABNORMAL HIGH (ref 0–35)

## 2019-03-20 NOTE — Telephone Encounter (Signed)
I left a message regarding schedule  

## 2019-04-03 MED FILL — CAPECITABINE 500 MG TABS: 500 | 21 days supply | Qty: 56 | Fill #2

## 2019-04-09 ENCOUNTER — Inpatient Hospital Stay: Payer: Medicare Other

## 2019-04-09 ENCOUNTER — Inpatient Hospital Stay (HOSPITAL_BASED_OUTPATIENT_CLINIC_OR_DEPARTMENT_OTHER): Payer: Medicare Other | Admitting: Adult Health

## 2019-04-09 ENCOUNTER — Other Ambulatory Visit: Payer: Self-pay

## 2019-04-09 VITALS — BP 147/81 | HR 70 | Temp 98.5°F | Resp 17 | Ht 64.5 in | Wt 145.1 lb

## 2019-04-09 DIAGNOSIS — Z95828 Presence of other vascular implants and grafts: Secondary | ICD-10-CM

## 2019-04-09 DIAGNOSIS — Z7901 Long term (current) use of anticoagulants: Secondary | ICD-10-CM

## 2019-04-09 DIAGNOSIS — C169 Malignant neoplasm of stomach, unspecified: Secondary | ICD-10-CM

## 2019-04-09 DIAGNOSIS — C250 Malignant neoplasm of head of pancreas: Secondary | ICD-10-CM

## 2019-04-09 DIAGNOSIS — Z86711 Personal history of pulmonary embolism: Secondary | ICD-10-CM

## 2019-04-09 DIAGNOSIS — C7951 Secondary malignant neoplasm of bone: Secondary | ICD-10-CM | POA: Diagnosis not present

## 2019-04-09 DIAGNOSIS — C168 Malignant neoplasm of overlapping sites of stomach: Secondary | ICD-10-CM

## 2019-04-09 DIAGNOSIS — Z79899 Other long term (current) drug therapy: Secondary | ICD-10-CM

## 2019-04-09 DIAGNOSIS — C787 Secondary malignant neoplasm of liver and intrahepatic bile duct: Secondary | ICD-10-CM

## 2019-04-09 DIAGNOSIS — D649 Anemia, unspecified: Secondary | ICD-10-CM

## 2019-04-09 DIAGNOSIS — Z8546 Personal history of malignant neoplasm of prostate: Secondary | ICD-10-CM | POA: Diagnosis not present

## 2019-04-09 DIAGNOSIS — Z5111 Encounter for antineoplastic chemotherapy: Secondary | ICD-10-CM | POA: Diagnosis not present

## 2019-04-09 DIAGNOSIS — C799 Secondary malignant neoplasm of unspecified site: Secondary | ICD-10-CM

## 2019-04-09 LAB — CBC WITH DIFFERENTIAL/PLATELET
Abs Immature Granulocytes: 0.03 10*3/uL (ref 0.00–0.07)
Basophils Absolute: 0 10*3/uL (ref 0.0–0.1)
Basophils Relative: 1 %
Eosinophils Absolute: 0.2 10*3/uL (ref 0.0–0.5)
Eosinophils Relative: 4 %
HCT: 28.4 % — ABNORMAL LOW (ref 39.0–52.0)
Hemoglobin: 9.2 g/dL — ABNORMAL LOW (ref 13.0–17.0)
Immature Granulocytes: 1 %
Lymphocytes Relative: 21 %
Lymphs Abs: 1.1 10*3/uL (ref 0.7–4.0)
MCH: 31 pg (ref 26.0–34.0)
MCHC: 32.4 g/dL (ref 30.0–36.0)
MCV: 95.6 fL (ref 80.0–100.0)
Monocytes Absolute: 0.9 10*3/uL (ref 0.1–1.0)
Monocytes Relative: 16 %
Neutro Abs: 3.2 10*3/uL (ref 1.7–7.7)
Neutrophils Relative %: 57 %
Platelets: 207 10*3/uL (ref 150–400)
RBC: 2.97 MIL/uL — ABNORMAL LOW (ref 4.22–5.81)
RDW: 22.2 % — ABNORMAL HIGH (ref 11.5–15.5)
WBC: 5.5 10*3/uL (ref 4.0–10.5)
nRBC: 0 % (ref 0.0–0.2)

## 2019-04-09 LAB — COMPREHENSIVE METABOLIC PANEL
ALT: 18 U/L (ref 0–44)
AST: 24 U/L (ref 15–41)
Albumin: 3.4 g/dL — ABNORMAL LOW (ref 3.5–5.0)
Alkaline Phosphatase: 113 U/L (ref 38–126)
Anion gap: 7 (ref 5–15)
BUN: 14 mg/dL (ref 8–23)
CO2: 27 mmol/L (ref 22–32)
Calcium: 8.7 mg/dL — ABNORMAL LOW (ref 8.9–10.3)
Chloride: 104 mmol/L (ref 98–111)
Creatinine, Ser: 0.91 mg/dL (ref 0.61–1.24)
GFR calc Af Amer: >60 mL/min (ref >60)
GFR calc non Af Amer: >60 mL/min (ref >60)
Glucose, Bld: 101 mg/dL — ABNORMAL HIGH (ref 70–99)
Potassium: 3.8 mmol/L (ref 3.5–5.1)
Sodium: 138 mmol/L (ref 135–145)
Total Bilirubin: 0.5 mg/dL (ref 0.3–1.2)
Total Protein: 6.5 g/dL (ref 6.5–8.1)

## 2019-04-09 LAB — CEA (IN HOUSE-CHCC): CEA (CHCC-In House): 5.78 ng/mL — ABNORMAL HIGH (ref 0.00–5.00)

## 2019-04-09 MED ORDER — PALONOSETRON HCL INJECTION 0.25 MG/5ML
INTRAVENOUS | Status: AC
  Filled 2019-04-09: qty 5

## 2019-04-09 MED ORDER — HEPARIN SOD (PORK) LOCK FLUSH 100 UNIT/ML IV SOLN
500.0000 [IU] | Freq: Once | INTRAVENOUS | Status: AC | PRN
  Administered 2019-04-09: 16:00:00 500 [IU]
  Filled 2019-04-09: qty 5

## 2019-04-09 MED ORDER — ACETAMINOPHEN 325 MG PO TABS
ORAL_TABLET | ORAL | Status: AC
  Filled 2019-04-09: qty 2

## 2019-04-09 MED ORDER — DIPHENHYDRAMINE HCL 25 MG PO CAPS
ORAL_CAPSULE | ORAL | Status: AC
  Filled 2019-04-09: qty 1

## 2019-04-09 MED ORDER — PEGFILGRASTIM 6 MG/0.6ML ~~LOC~~ PSKT
PREFILLED_SYRINGE | SUBCUTANEOUS | Status: AC
  Filled 2019-04-09: qty 0.6

## 2019-04-09 MED ORDER — DEXTROSE 5 % IV SOLN
Freq: Once | INTRAVENOUS | Status: AC
  Administered 2019-04-09: 14:00:00 via INTRAVENOUS
  Filled 2019-04-09: qty 250

## 2019-04-09 MED ORDER — OXALIPLATIN CHEMO INJECTION 100 MG/20ML
85.0000 mg/m2 | Freq: Once | INTRAVENOUS | Status: AC
  Administered 2019-04-09: 140 mg via INTRAVENOUS
  Filled 2019-04-09: qty 20

## 2019-04-09 MED ORDER — ACETAMINOPHEN 325 MG PO TABS
650.0000 mg | ORAL_TABLET | Freq: Once | ORAL | Status: AC
  Administered 2019-04-09: 650 mg via ORAL

## 2019-04-09 MED ORDER — PALONOSETRON HCL INJECTION 0.25 MG/5ML
0.2500 mg | Freq: Once | INTRAVENOUS | Status: AC
  Administered 2019-04-09: 12:00:00 0.25 mg via INTRAVENOUS

## 2019-04-09 MED ORDER — SODIUM CHLORIDE 0.9 % IV SOLN
Freq: Once | INTRAVENOUS | Status: AC
  Administered 2019-04-09: 12:00:00 via INTRAVENOUS
  Filled 2019-04-09: qty 250

## 2019-04-09 MED ORDER — SODIUM CHLORIDE 0.9 % IV SOLN
Freq: Once | INTRAVENOUS | Status: AC
  Administered 2019-04-09: 13:00:00 via INTRAVENOUS
  Filled 2019-04-09: qty 5

## 2019-04-09 MED ORDER — SODIUM CHLORIDE 0.9% FLUSH
10.0000 mL | INTRAVENOUS | Status: DC | PRN
  Administered 2019-04-09: 16:00:00 10 mL
  Filled 2019-04-09: qty 10

## 2019-04-09 MED ORDER — SODIUM CHLORIDE 0.9% FLUSH
10.0000 mL | Freq: Once | INTRAVENOUS | Status: AC
  Administered 2019-04-09: 10:00:00 10 mL
  Filled 2019-04-09: qty 10

## 2019-04-09 MED ORDER — TRASTUZUMAB CHEMO 150 MG IV SOLR
6.0000 mg/kg | Freq: Once | INTRAVENOUS | Status: AC
  Administered 2019-04-09: 357 mg via INTRAVENOUS
  Filled 2019-04-09: qty 17

## 2019-04-09 MED ORDER — PEGFILGRASTIM 6 MG/0.6ML ~~LOC~~ PSKT
6.0000 mg | PREFILLED_SYRINGE | Freq: Once | SUBCUTANEOUS | Status: AC
  Administered 2019-04-09: 6 mg via SUBCUTANEOUS

## 2019-04-09 MED ORDER — DIPHENHYDRAMINE HCL 25 MG PO CAPS
25.0000 mg | ORAL_CAPSULE | Freq: Once | ORAL | Status: AC
  Administered 2019-04-09: 12:00:00 25 mg via ORAL

## 2019-04-09 NOTE — Progress Notes (Signed)
[place]  [phone] Fax:(336) [phone]     ID: Peter Wood DOB: [birth year]  MR#: [id number]  GBT#:[id number]  Patient Care Team: Cassandria Anger, MD as PCP - General Milus Banister, MD as Attending Physician (Gastroenterology) Magrinat, Virgie Dad, MD as Consulting Physician (Oncology) Gatha Mayer, MD as Consulting Physician (Gastroenterology) [name], MD as Consulting Physician (Ophthalmology) Ramon Dredge, MD as Referring Physician (Radiation Oncology) Larey Dresser, MD as Consulting Physician (Cardiology) OTHER MD:   CHIEF COMPLAINT: Metastatic gastric adenocarcinoma  CURRENT TREATMENT: oxaliplatin, capecitabine, trastuzumab   INTERVAL HISTORY: Peter Wood was seen today for follow-up and treatment of his metastatic gastric adenocarcinoma.   He continues on oxaliplatin, capecitabine and trastuzumab. Today is day 1 cycle 8.  Peter Wood is tolerating treatment well. He has some skin darkening on his hands and feet.  He denies any diarrhea.  He starts the capecitabine today.  He denies any peripheral neuropathy.     We are following his tumor marker: Lab Results  Component Value Date   CAN199 50 (H) 04/09/2019   XTG626 61 (H) 03/19/2019   RSW546 108 (H) 02/18/2019   EVO350 185 (H) 01/28/2019   KXF818 416 (H) 01/07/2019   His CEA also has decreased: Lab Results  Component Value Date   CEA1 5.78 (H) 04/09/2019   CEA1 8.93 (H) 03/19/2019   CEA1 15.24 (H) 02/18/2019   CEA1 30.78 (H) 01/28/2019   CEA1 61.52 (H) 01/07/2019    REVIEW OF SYSTEMS: Peter Wood has not had any further fevers.  He is without nausea, vomiting, bowel or bladder changes.  He has no pain.  He is working on Peter Wood, which is working.  He has no cough, shortness of breath, chest pain, or palpitations.  A detailed ROS was otherwise non contributory today.     HISTORY OF CURRENT ILLNESS: From the original intake note:  Peter Wood  presented to the emergency room 09/07/2018 with mid/upper abdominal pain.  He reported weight loss of 25 pounds over the past 6 months and significant anorexia.  His liver function tests were abnormal and a right upper quadrant ultrasound was obtained.  This was read as concerning for malignancy and a CT of the abdomen and pelvis with contrast was obtained the same day, showing  1. Too numerous to count hypodense masses of the liver concerning for hepatic metastasis. In the region of the porta hepatis, there are hypodense lesions more likely associated with the liver though the pancreatic head is partially obscured by a 3.2 cm hypodense mass. Findings are more likely associated with the liver and less likely an isolated pancreatic mass. 2. Upper abdominal mesenteric and peripancreatic lymphadenopathy suspicious for metastatic disease. 3. 6 mm sclerotic density in the left iliac bone adjacent to the SI joint. Given history of prostate cancer, differential considerations would include osteoblastic metastasis or possibly bone island. Showed only diverticular disease.  He was referred to GI and Dr. Ardis Hughs notes a colonoscopy performed 04/23/2017 makes the possibility of colon cancer unlikely.  Further work-up was planned, but on 09/19/2018 the patient again presented to the emergency room with chest pain, now in a different area, and a staging CT scan of the chest showed filling defects in the right lower lobe pulmonary arteries consistent with embolism.  There was a small pericardial effusion and an additional 2.4 cm left thyroid mass.  Aortic atherosclerosis was noted  He was started on intravenous heparin and on 09/20/2018 underwent upper endoscopy  under Dr. Carlean Purl showing a giant nonbleeding cratered gastric ulcer in the posterior wall of the stomach.  Biopsies of this procedure showed (SZA 20-187) adenocarcinoma.   At that point we were consulted and we obtained tumor markers which included a CEA of  1318.2, a CA 19-9 of 67,479, a normal AFP and a normal PSA.  I discussed the case with pathology and they do not feel that additional testing would be helpful in terms of discriminating between a primary gastric and a primary biliary/pancreatic tumor.  The patient's subsequent history is as detailed below.   PAST MEDICAL HISTORY: Past Medical History:  Diagnosis Date  . Arthritis   . Diverticulosis of colon   . Family history of breast cancer   . Family history of prostate cancer   . HTN (hypertension)   . Hx of colonic polyp   . Pneumonia    as a child/baby  . Poor circulation of extremity    right leg  . Prostate cancer ([place]) 2015   prostate cancer - radiation treated     PAST SURGICAL HISTORY: Past Surgical History:  Procedure Laterality Date  . [place] VITRECTOMY WITH 20 GAUGE MVR PORT FOR MACULAR HOLE Right 09/19/2016   Procedure: 25 GAUGE PARS PLANA VITRECTOMY WITH 20 GAUGE MVR PORT FOR MACULAR HOLE, MEMBRANE PEEL, SERUM PATCH, GAS FLUID EXCHANGE, HEADSCOPE LASER;  Surgeon: [name], MD;  Location: [facility];  Service: Ophthalmology;  Laterality: Right;  . BIOPSY  09/20/2018   Procedure: BIOPSY;  Surgeon: Gatha Mayer, MD;  Location: [facility] ENDOSCOPY;  Service: Endoscopy;;  . CATARACT EXTRACTION  08/27/12   Right  . COLONOSCOPY    . ESOPHAGOGASTRODUODENOSCOPY (EGD) WITH PROPOFOL N/A 09/20/2018   Procedure: ESOPHAGOGASTRODUODENOSCOPY (EGD) WITH PROPOFOL;  Surgeon: Gatha Mayer, MD;  Location: [facility];  Service: Endoscopy;  Laterality: N/A;  . HAND SURGERY  2016   right thumb  . IR IMAGING GUIDED PORT INSERTION  11/04/2018  . KNEE SURGERY Left 1989     FAMILY HISTORY: Family History  Problem Relation Age of Onset  . Prostate cancer Wood        dx >50  . Hypertension Mother   . Ulcers Mother   . Breast cancer Sister        dx 20's  . Prostate cancer Brother        dx under 73, metasttic, cause of his death  . Ulcers Brother   . Ulcers  Sister    Peter Wood's Wood died from heart complications with a pacemaker at age 52; Peter Wood also has prostate cancer. Patients' mother died from natural causes at age 79. The patient has 4 brothers and 3 sisters. One of [place] sisters had breast cancer in her 51s and a brother had prostate cancer. Patient denies anyone in her family having ovarian, or pancreatic cancer. Zadyn mentions that his youngest sister and his brother with prostate cancer also had stomach ulcers.    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 His wife, Peter Wood, is a retired [employer] summer Radiographer, therapeutic. As of 09/2018, they have been married for 56 years. They have 4 children, Peter Wood, Peter Wood, Peter Wood, and Peter Wood. Peter Wood lives in [place] and works at the [employer]. Sherrod lives in [place] and works at [employer] and as a part Horticulturist, commercial. Germane lives in [place] and is a Engineer, drilling for a medical company. Peter Wood lives in [place] and works at [employer]. Peter Wood has 7 grandchildren  and 1 step great grandchild. He attends [employer].     ADVANCED DIRECTIVES: His wife, Peter Wood, is automatically his healthcare power of attorney.     HEALTH MAINTENANCE: Social History   Tobacco Use  . Smoking status: Former Smoker    Years: 4.00    Quit date: 09/12/1967    Years since quitting: 51.6  . Smokeless tobacco: Never Used  Substance Use Topics  . Alcohol use: No  . Drug use: No    Colonoscopy: 2018/[facility]  PSA: 0.54 on 09/18/2018  No Known Allergies  Current Outpatient Medications  Medication Sig Dispense Refill  . amLODipine (NORVASC) 5 MG tablet TAKE ONE TABLET BY MOUTH ONE TIME DAILY  90 tablet 2  . apixaban (ELIQUIS) 2.5 MG TABS tablet Take 1 tablet (2.5 mg total) by mouth 2 (two) times daily. 60 tablet 12  . benazepril (LOTENSIN) 40 MG tablet TAKE ONE TABLET BY MOUTH ONE TIME DAILY  90 tablet 2  . capecitabine (XELODA) 500 MG tablet TAKE 2 TABLETS (1,000 MG  TOTAL) BY MOUTH 2 (TWO) TIMES DAILY AFTER A MEAL. TAKE ON DAYS 1-14 OF CHEMOTHERAPY. REPEAT EVERY 21 DAYS. 56 tablet 4  . Cholecalciferol 1000 UNITS tablet Take 1,000 Units by mouth daily.     . ciprofloxacin (CIPRO) 500 MG tablet Take 1 tablet (500 mg total) by mouth 2 (two) times daily. 14 tablet 1  . latanoprost (XALATAN) 0.005 % ophthalmic solution Place 1 drop into both eyes at bedtime.     . lidocaine-prilocaine (EMLA) cream Apply to affected area once 30 g 3  . ondansetron (ZOFRAN) 4 MG tablet Take 1 tablet (4 mg total) by mouth every 8 (eight) hours as needed for nausea or vomiting. 20 tablet 1  . prochlorperazine (COMPAZINE) 10 MG tablet Take 1 tablet (10 mg total) by mouth every 6 (six) hours as needed (Nausea or vomiting). (Patient not taking: Reported on 04/09/2019) 30 tablet 1   No current facility-administered medications for this visit.    Facility-Administered Medications Ordered in Other Visits  Medication Dose Route Frequency Provider Last Rate Last Dose  . sodium chloride flush (NS) 0.9 % injection 10 mL  10 mL Intravenous PRN Magrinat, Virgie Dad, MD         OBJECTIVE: Elderly African-American man in no acute distress Vitals:   04/09/19 1038  BP: (!) 147/81  Pulse: 70  Resp: 17  Temp: 98.5 F (36.9 C)  SpO2: 100%     Body mass index is 24.52 kg/m.   Wt Readings from Last 3 Encounters:  04/09/19 145 lb 1.6 oz (65.8 kg)  03/19/19 144 lb 14.4 oz (65.7 kg)  03/13/19 143 lb (64.9 kg)  ECOG FS:1 - Symptomatic but completely ambulatory GENERAL: Patient is a well appearing older man in no acute distress HEENT:  Sclerae anicteric.  Oropharynx clear and moist. No ulcerations or evidence of oropharyngeal candidiasis. Neck is supple.  NODES:  No cervical, supraclavicular, or axillary lymphadenopathy palpated.  LUNGS:  Clear to auscultation bilaterally.  No wheezes or rhonchi. HEART:  Regular rate and rhythm. No murmur appreciated. ABDOMEN:  Soft, nontender.  Positive,  normoactive bowel sounds. No organomegaly palpated. MSK:  No focal spinal tenderness to palpation. Full range of motion bilaterally in the upper extremities. EXTREMITIES:  No peripheral edema.   SKIN:  Hyperpigmentation noted on hands, + darkening of the nail beds NEURO:  Nonfocal. Well oriented.  Appropriate affect.     LAB RESULTS:  CMP  Component Value Date/Time   NA 138 04/09/2019 1000   K 3.8 04/09/2019 1000   CL 104 04/09/2019 1000   CO2 27 04/09/2019 1000   GLUCOSE 101 (H) 04/09/2019 1000   GLUCOSE 105 (H) 09/13/2006 0900   BUN 14 04/09/2019 1000   CREATININE 0.91 04/09/2019 1000   CREATININE 1.02 10/16/2018 0936   CALCIUM 8.7 (L) 04/09/2019 1000   PROT 6.5 04/09/2019 1000   ALBUMIN 3.4 (L) 04/09/2019 1000   AST 24 04/09/2019 1000   AST 76 (H) 10/16/2018 0936   ALT 18 04/09/2019 1000   ALT 47 (H) 10/16/2018 0936   ALKPHOS 113 04/09/2019 1000   BILITOT 0.5 04/09/2019 1000   BILITOT 0.7 10/16/2018 0936   GFRNONAA >60 04/09/2019 1000   GFRNONAA >60 10/16/2018 0936   GFRAA >60 04/09/2019 1000   GFRAA >60 10/16/2018 0936    No results found for: TOTALPROTELP, ALBUMINELP, A1GS, A2GS, BETS, BETA2SER, GAMS, MSPIKE, SPEI  No results found for: KPAFRELGTCHN, LAMBDASER, KAPLAMBRATIO  Lab Results  Component Value Date   WBC 5.5 04/09/2019   NEUTROABS 3.2 04/09/2019   HGB 9.2 (L) 04/09/2019   HCT 28.4 (L) 04/09/2019   MCV 95.6 04/09/2019   PLT 207 04/09/2019    _0 @  No results found for: LABCA2  No components found for: ZOXWRU045  No results for input(s): INR in the last 168 hours.  No results found for: LABCA2  Lab Results  Component Value Date   CAN199 50 (H) 04/09/2019    No results found for: WUJ811  No results found for: BJY782  No results found for: CA2729  No components found for: HGQUANT  Lab Results  Component Value Date   CEA1 5.78 (H) 04/09/2019   /  CEA ([facility]In [facility])  Date Value Ref Range Status  04/09/2019 5.78  (H) 0.00 - 5.00 ng/mL Final    Comment:    (NOTE) This test was performed using Architect's Chemiluminescent Microparticle Immunoassay. Values obtained from different assay methods cannot be used interchangeably. Please note that 5-10% of patients who smoke may see CEA levels up to 6.9 ng/mL. Performed at [facility], [place] [address], [place], [place] [address]      No results found for: AFPTUMOR  No results found for: [place]  No results found for: PSA1  Appointment on 04/09/2019  Component Date Value Ref Range Status  . CEA ([facility]) 04/09/2019 5.78* 0.00 - 5.00 ng/mL Final   Comment: (NOTE) This test was performed using Architect's Chemiluminescent Microparticle Immunoassay. Values obtained from different assay methods cannot be used interchangeably. Please note that 5-10% of patients who smoke may see CEA levels up to 6.9 ng/mL. Performed at [facility], [place] [address] [place], [place] [address]   . CA 19-9 04/09/2019 50* 0 - 35 U/mL Final   Comment: (NOTE) Roche Diagnostics Electrochemiluminescence Immunoassay (ECLIA) Values obtained with different assay methods or kits cannot be used interchangeably.  Results cannot be interpreted as absolute evidence of the presence or absence of malignant disease. Performed At: [facility] [place], [place] [id number] Rush Farmer MD MW:[id number]   . Sodium 04/09/2019 138  135 - 145 mmol/L Final  . Potassium 04/09/2019 3.8  3.5 - 5.1 mmol/L Final  . Chloride 04/09/2019 104  98 - 111 mmol/L Final  . CO2 04/09/2019 27  22 - 32 mmol/L Final  . Glucose, Bld 04/09/2019 101* 70 - 99 mg/dL Final  . BUN 04/09/2019 14  8 -  23 mg/dL Final  . Creatinine, Ser 04/09/2019 0.91  0.61 - 1.24 mg/dL Final  . Calcium 04/09/2019 8.7* 8.9 - 10.3 mg/dL Final  . Total Protein 04/09/2019 6.5  6.5 - 8.1 g/dL Final  . Albumin 04/09/2019 3.4* 3.5 - 5.0 g/dL Final  .  AST 04/09/2019 24  15 - 41 U/L Final  . ALT 04/09/2019 18  0 - 44 U/L Final  . Alkaline Phosphatase 04/09/2019 113  38 - 126 U/L Final  . Total Bilirubin 04/09/2019 0.5  0.3 - 1.2 mg/dL Final  . GFR calc non Af Amer 04/09/2019 >60  >60 mL/min Final  . GFR calc Af Amer 04/09/2019 >60  >60 mL/min Final  . Anion gap 04/09/2019 7  5 - 15 Final   Performed at [facility], [place] [address], [place], [place] [address]  . WBC 04/09/2019 5.5  4.0 - 10.5 K/uL Final  . RBC 04/09/2019 2.97* 4.22 - 5.81 MIL/uL Final  . Hemoglobin 04/09/2019 9.2* 13.0 - 17.0 g/dL Final  . HCT 04/09/2019 28.4* 39.0 - 52.0 % Final  . MCV 04/09/2019 95.6  80.0 - 100.0 fL Final  . MCH 04/09/2019 31.0  26.0 - 34.0 pg Final  . MCHC 04/09/2019 32.4  30.0 - 36.0 g/dL Final  . RDW 04/09/2019 22.2* 11.5 - 15.5 % Final  . Platelets 04/09/2019 207  150 - 400 K/uL Final  . nRBC 04/09/2019 0.0  0.0 - 0.2 % Final  . Neutrophils Relative % 04/09/2019 57  % Final  . Neutro Abs 04/09/2019 3.2  1.7 - 7.7 K/uL Final  . Lymphocytes Relative 04/09/2019 21  % Final  . Lymphs Abs 04/09/2019 1.1  0.7 - 4.0 K/uL Final  . Monocytes Relative 04/09/2019 16  % Final  . Monocytes Absolute 04/09/2019 0.9  0.1 - 1.0 K/uL Final  . Eosinophils Relative 04/09/2019 4  % Final  . Eosinophils Absolute 04/09/2019 0.2  0.0 - 0.5 K/uL Final  . Basophils Relative 04/09/2019 1  % Final  . Basophils Absolute 04/09/2019 0.0  0.0 - 0.1 K/uL Final  . Immature Granulocytes 04/09/2019 1  % Final  . Abs Immature Granulocytes 04/09/2019 0.03  0.00 - 0.07 K/uL Final   Performed at [facility], [place] [address], [place], [place] [address]    (this displays the last labs from the last 3 days)  No results found for: TOTALPROTELP, ALBUMINELP, A1GS, A2GS, BETS, BETA2SER, GAMS, MSPIKE, SPEI (this displays SPEP labs)  No results found for: KPAFRELGTCHN, LAMBDASER, KAPLAMBRATIO (kappa/lambda light chains)  No results  found for: HGBA, HGBA2QUANT, HGBFQUANT, HGBSQUAN (Hemoglobinopathy evaluation)   No results found for: LDH  Lab Results  Component Value Date   IRON 38 (L) 09/21/2018   TIBC 251 09/21/2018   IRONPCTSAT 15 (L) 09/21/2018   (Iron and TIBC)  Lab Results  Component Value Date   FERRITIN 138 09/21/2018    Urinalysis    Component Value Date/Time   COLORURINE YELLOW 02/18/2019 1056   APPEARANCEUR CLEAR 02/18/2019 1056   LABSPEC 1.018 02/18/2019 1056   PHURINE 6.0 02/18/2019 1056   GLUCOSEU NEGATIVE 02/18/2019 [place] 07/20/2017 1056   HGBUR NEGATIVE 02/18/2019 [place] 02/18/2019 [place] 02/18/2019 1056   PROTEINUR 30 (A) 02/18/2019 1056   UROBILINOGEN 0.2 07/20/2017 1056   NITRITE NEGATIVE 02/18/2019 [place] 02/18/2019 1056     STUDIES:  No results found.   ELIGIBLE FOR  AVAILABLE RESEARCH PROTOCOL: no   ASSESSMENT: 84 y.o. [place], [place] man status post gastric biopsy 09/20/2018 showing adenocarcinoma, with a CA 19-9 greater than 65,000; staging studies showing an apparent mass adjacent to the head of the pancreas, innumerable liver lesions, upper abdominal mesenteric and peripancreatic lymphadenopathy, and an isolated sclerotic density in the left iliac bone  (a) genomics requested on gastric biopsy showed the tumor to be HER-2 amplified at 3+, PD-L1 positive, and T p53 mutated.  MSI is stable, mismatch repair status is proficient and the tumor mutational burden is intermediate.  (1) chest CT scan 09/19/2018 shows right lower lobe pulmonary embolism  (a) on intravenous heparin started 09/19/2018, transitioned to apixaban 10/01/2018  (2) genetics testing 10/04/2018: negative  (a) family history of prostate and early breast cancer; patient has a history of prostate cancer  (3) started gemcitabine/Abraxane 10/09/2018, repeated days 1, 8 and 15 of each 28-day cycle  (a) stopped after 1 cycle (3  doses) with reassessment of diagnosis based on CARIS results, noted above  (4) to start oxaliplatin, capecitabine and trastuzumab 11/06/2018  (a) baseline echocardiogram 09/20/2018 shows an ejection fraction in the 55-60% range  (b) oxaliplatin and trastuzumab every 21 days started 11/06/2018  (c) capecitabine dose 1000 mg twice daily for 14 days of every 21-day cycle  (d) CT of the abdomen and pelvis 02/11/2019 shows marked improvement in his liver lesions and resolution of periportal adenopathy  (e) tumor markers also show significant response,  (f) echocardiogram 02/12/2019 shows an ejection fraction in the 60-65% range   PLAN: Zakry continues to do well on his treatment.  His tumor markers are responding, he has no clinical signs of progression, and he is tolerating it well.  This is all good news.  He will proceed with his eighth treatment today.    I congratulated him on his weight gain, and recommended he continue with healthy diet and exercise.  He is going to return in 3 weeks for labs, f/u, and his next treatment.  He was recommended to continue with the appropriate pandemic precautions. He knows to call for any questions or concerns prior to his next appointment with [place].    A total of (20) minutes of face-to-face time was spent with this patient with greater than 50% of that time in counseling and care-coordination.    Wilber Bihari, NP 04/10/19 12:19 PM Medical Oncology and Hematology [facility] [address] [place], [place] [address] Tel. [phone]    Fax. [phone]

## 2019-04-09 NOTE — Patient Instructions (Signed)
[facility] Discharge Instructions for Patients Receiving Chemotherapy  Today you received the following chemotherapy agents Oxaliplatin (ELOXATIN) & Trastuzumab (HERCEPTIN).  To help prevent nausea and vomiting after your treatment, we encourage you to take your nausea medication as prescribed.  If you develop nausea and vomiting that is not controlled by your nausea medication, call the clinic.   BELOW ARE SYMPTOMS THAT SHOULD BE REPORTED IMMEDIATELY:  *FEVER GREATER THAN 100.5 F  *CHILLS WITH OR WITHOUT FEVER  NAUSEA AND VOMITING THAT IS NOT CONTROLLED WITH YOUR NAUSEA MEDICATION  *UNUSUAL SHORTNESS OF BREATH  *UNUSUAL BRUISING OR BLEEDING  TENDERNESS IN MOUTH AND THROAT WITH OR WITHOUT PRESENCE OF ULCERS  *URINARY PROBLEMS  *BOWEL PROBLEMS  UNUSUAL RASH Items with * indicate a potential emergency and should be followed up as soon as possible.  Feel free to call the clinic should you have any questions or concerns. The clinic phone number is [phone].  Please show the [place] at check-in to the Emergency Department and triage nurse.  Pegfilgrastim injection (Neulasta On-Pro) What is this medicine? PEGFILGRASTIM (PEG fil gra stim) is a long-acting granulocyte colony-stimulating factor that stimulates the growth of neutrophils, a type of white blood cell important in the body's fight against infection. It is used to reduce the incidence of fever and infection in patients with certain types of cancer who are receiving chemotherapy that affects the bone marrow, and to increase survival after being exposed to high doses of radiation. This medicine may be used for other purposes; ask your health care provider or pharmacist if you have questions. COMMON BRAND NAME(S): Steve Rattler, Ziextenzo What should I tell my health care provider before I take this medicine? They need to know if you have any of these conditions:  kidney  disease  latex allergy  ongoing radiation therapy  sickle cell disease  skin reactions to acrylic adhesives (On-Body Injector only)  an unusual or allergic reaction to pegfilgrastim, filgrastim, other medicines, foods, dyes, or preservatives  pregnant or trying to get pregnant  breast-feeding How should I use this medicine? This medicine is for injection under the skin. If you get this medicine at home, you will be taught how to prepare and give the pre-filled syringe or how to use the On-body Injector. Refer to the patient Instructions for Use for detailed instructions. Use exactly as directed. Tell your healthcare provider immediately if you suspect that the On-body Injector may not have performed as intended or if you suspect the use of the On-body Injector resulted in a missed or partial dose. It is important that you put your used needles and syringes in a special sharps container. Do not put them in a trash can. If you do not have a sharps container, call your pharmacist or healthcare provider to get one. Talk to your pediatrician regarding the use of this medicine in children. While this drug may be prescribed for selected conditions, precautions do apply. Overdosage: If you think you have taken too much of this medicine contact a poison control center or emergency room at once. NOTE: This medicine is only for you. Do not share this medicine with others. What if I miss a dose? It is important not to miss your dose. Call your doctor or health care professional if you miss your dose. If you miss a dose due to an On-body Injector failure or leakage, a new dose should be administered as soon as possible using a single prefilled syringe for  manual use. What may interact with this medicine? Interactions have not been studied. Give your health care provider a list of all the medicines, herbs, non-prescription drugs, or dietary supplements you use. Also tell them if you smoke, drink alcohol,  or use illegal drugs. Some items may interact with your medicine. This list may not describe all possible interactions. Give your health care provider a list of all the medicines, herbs, non-prescription drugs, or dietary supplements you use. Also tell them if you smoke, drink alcohol, or use illegal drugs. Some items may interact with your medicine. What should I watch for while using this medicine? You may need blood work done while you are taking this medicine. If you are going to need a MRI, CT scan, or other procedure, tell your doctor that you are using this medicine (On-Body Injector only). What side effects may I notice from receiving this medicine? Side effects that you should report to your doctor or health care professional as soon as possible:  allergic reactions like skin rash, itching or hives, swelling of the face, lips, or tongue  back pain  dizziness  fever  pain, redness, or irritation at site where injected  pinpoint red spots on the skin  red or dark-brown urine  shortness of breath or breathing problems  stomach or side pain, or pain at the shoulder  swelling  tiredness  trouble passing urine or change in the amount of urine Side effects that usually do not require medical attention (report to your doctor or health care professional if they continue or are bothersome):  bone pain  muscle pain This list may not describe all possible side effects. Call your doctor for medical advice about side effects. You may report side effects to FDA at 1-800-FDA-1088. Where should I keep my medicine? Keep out of the reach of children. If you are using this medicine at home, you will be instructed on how to store it. Throw away any unused medicine after the expiration date on the label. NOTE: This sheet is a summary. It may not cover all possible information. If you have questions about this medicine, talk to your doctor, pharmacist, or health care provider.  2020  [employer] Standard (2017-12-03 16:57:08)

## 2019-04-10 ENCOUNTER — Encounter: Payer: Self-pay | Admitting: Adult Health

## 2019-04-10 LAB — CANCER ANTIGEN 19-9: CA 19-9: 50 U/mL — ABNORMAL HIGH (ref 0–35)

## 2019-04-28 MED FILL — CAPECITABINE 500 MG TABS: 500 | 21 days supply | Qty: 56 | Fill #3

## 2019-04-30 ENCOUNTER — Telehealth: Payer: Self-pay | Admitting: *Deleted

## 2019-04-30 ENCOUNTER — Other Ambulatory Visit: Payer: Self-pay

## 2019-04-30 ENCOUNTER — Encounter: Payer: Self-pay | Admitting: Adult Health

## 2019-04-30 ENCOUNTER — Inpatient Hospital Stay (HOSPITAL_BASED_OUTPATIENT_CLINIC_OR_DEPARTMENT_OTHER): Payer: Medicare Other | Admitting: Adult Health

## 2019-04-30 ENCOUNTER — Inpatient Hospital Stay: Payer: Medicare Other

## 2019-04-30 ENCOUNTER — Inpatient Hospital Stay: Payer: Medicare Other | Attending: Oncology

## 2019-04-30 VITALS — BP 142/85

## 2019-04-30 VITALS — BP 160/92 | HR 85 | Temp 98.7°F | Ht 64.0 in | Wt 147.4 lb

## 2019-04-30 DIAGNOSIS — Z8042 Family history of malignant neoplasm of prostate: Secondary | ICD-10-CM | POA: Diagnosis not present

## 2019-04-30 DIAGNOSIS — C169 Malignant neoplasm of stomach, unspecified: Secondary | ICD-10-CM

## 2019-04-30 DIAGNOSIS — C168 Malignant neoplasm of overlapping sites of stomach: Secondary | ICD-10-CM

## 2019-04-30 DIAGNOSIS — Z7901 Long term (current) use of anticoagulants: Secondary | ICD-10-CM | POA: Insufficient documentation

## 2019-04-30 DIAGNOSIS — I10 Essential (primary) hypertension: Secondary | ICD-10-CM | POA: Diagnosis not present

## 2019-04-30 DIAGNOSIS — Z5112 Encounter for antineoplastic immunotherapy: Secondary | ICD-10-CM | POA: Diagnosis not present

## 2019-04-30 DIAGNOSIS — Z87891 Personal history of nicotine dependence: Secondary | ICD-10-CM | POA: Diagnosis not present

## 2019-04-30 DIAGNOSIS — R978 Other abnormal tumor markers: Secondary | ICD-10-CM | POA: Diagnosis not present

## 2019-04-30 DIAGNOSIS — Z8546 Personal history of malignant neoplasm of prostate: Secondary | ICD-10-CM | POA: Diagnosis not present

## 2019-04-30 DIAGNOSIS — Z803 Family history of malignant neoplasm of breast: Secondary | ICD-10-CM | POA: Diagnosis not present

## 2019-04-30 DIAGNOSIS — Z86711 Personal history of pulmonary embolism: Secondary | ICD-10-CM | POA: Diagnosis not present

## 2019-04-30 DIAGNOSIS — Z79899 Other long term (current) drug therapy: Secondary | ICD-10-CM | POA: Insufficient documentation

## 2019-04-30 DIAGNOSIS — C787 Secondary malignant neoplasm of liver and intrahepatic bile duct: Secondary | ICD-10-CM | POA: Diagnosis not present

## 2019-04-30 DIAGNOSIS — C799 Secondary malignant neoplasm of unspecified site: Secondary | ICD-10-CM

## 2019-04-30 DIAGNOSIS — Z5189 Encounter for other specified aftercare: Secondary | ICD-10-CM | POA: Diagnosis not present

## 2019-04-30 DIAGNOSIS — Z95828 Presence of other vascular implants and grafts: Secondary | ICD-10-CM

## 2019-04-30 DIAGNOSIS — D649 Anemia, unspecified: Secondary | ICD-10-CM

## 2019-04-30 DIAGNOSIS — Z5111 Encounter for antineoplastic chemotherapy: Secondary | ICD-10-CM | POA: Diagnosis present

## 2019-04-30 LAB — CBC WITH DIFFERENTIAL/PLATELET
Abs Immature Granulocytes: 0.02 10*3/uL (ref 0.00–0.07)
Basophils Absolute: 0.1 10*3/uL (ref 0.0–0.1)
Basophils Relative: 1 %
Eosinophils Absolute: 0.2 10*3/uL (ref 0.0–0.5)
Eosinophils Relative: 4 %
HCT: 28.4 % — ABNORMAL LOW (ref 39.0–52.0)
Hemoglobin: 9.1 g/dL — ABNORMAL LOW (ref 13.0–17.0)
Immature Granulocytes: 1 %
Lymphocytes Relative: 23 %
Lymphs Abs: 0.9 10*3/uL (ref 0.7–4.0)
MCH: 30.5 pg (ref 26.0–34.0)
MCHC: 32 g/dL (ref 30.0–36.0)
MCV: 95.3 fL (ref 80.0–100.0)
Monocytes Absolute: 0.8 10*3/uL (ref 0.1–1.0)
Monocytes Relative: 19 %
Neutro Abs: 2.2 10*3/uL (ref 1.7–7.7)
Neutrophils Relative %: 52 %
Platelets: 198 10*3/uL (ref 150–400)
RBC: 2.98 MIL/uL — ABNORMAL LOW (ref 4.22–5.81)
RDW: 22.8 % — ABNORMAL HIGH (ref 11.5–15.5)
WBC: 4.1 10*3/uL (ref 4.0–10.5)
nRBC: 0 % (ref 0.0–0.2)

## 2019-04-30 LAB — COMPREHENSIVE METABOLIC PANEL
ALT: 18 U/L (ref 0–44)
AST: 23 U/L (ref 15–41)
Albumin: 3.4 g/dL — ABNORMAL LOW (ref 3.5–5.0)
Alkaline Phosphatase: 108 U/L (ref 38–126)
Anion gap: 8 (ref 5–15)
BUN: 14 mg/dL (ref 8–23)
CO2: 26 mmol/L (ref 22–32)
Calcium: 8.4 mg/dL — ABNORMAL LOW (ref 8.9–10.3)
Chloride: 105 mmol/L (ref 98–111)
Creatinine, Ser: 0.9 mg/dL (ref 0.61–1.24)
GFR calc Af Amer: >60 mL/min (ref >60)
GFR calc non Af Amer: >60 mL/min (ref >60)
Glucose, Bld: 114 mg/dL — ABNORMAL HIGH (ref 70–99)
Potassium: 3.9 mmol/L (ref 3.5–5.1)
Sodium: 139 mmol/L (ref 135–145)
Total Bilirubin: 0.5 mg/dL (ref 0.3–1.2)
Total Protein: 6.2 g/dL — ABNORMAL LOW (ref 6.5–8.1)

## 2019-04-30 LAB — CEA (IN HOUSE-CHCC): CEA (CHCC-In House): 6 ng/mL — ABNORMAL HIGH (ref 0.00–5.00)

## 2019-04-30 MED ORDER — PEGFILGRASTIM 6 MG/0.6ML ~~LOC~~ PSKT
6.0000 mg | PREFILLED_SYRINGE | Freq: Once | SUBCUTANEOUS | Status: AC
  Administered 2019-04-30: 14:00:00 6 mg via SUBCUTANEOUS

## 2019-04-30 MED ORDER — SODIUM CHLORIDE 0.9% FLUSH
10.0000 mL | Freq: Once | INTRAVENOUS | Status: AC
  Administered 2019-04-30: 10 mL
  Filled 2019-04-30: qty 10

## 2019-04-30 MED ORDER — PALONOSETRON HCL INJECTION 0.25 MG/5ML
INTRAVENOUS | Status: AC
  Filled 2019-04-30: qty 5

## 2019-04-30 MED ORDER — DIPHENHYDRAMINE HCL 25 MG PO CAPS
25.0000 mg | ORAL_CAPSULE | Freq: Once | ORAL | Status: AC
  Administered 2019-04-30: 10:00:00 25 mg via ORAL

## 2019-04-30 MED ORDER — ACETAMINOPHEN 325 MG PO TABS
650.0000 mg | ORAL_TABLET | Freq: Once | ORAL | Status: AC
  Administered 2019-04-30: 10:00:00 650 mg via ORAL

## 2019-04-30 MED ORDER — PALONOSETRON HCL INJECTION 0.25 MG/5ML
0.2500 mg | Freq: Once | INTRAVENOUS | Status: AC
  Administered 2019-04-30: 10:00:00 0.25 mg via INTRAVENOUS

## 2019-04-30 MED ORDER — ACETAMINOPHEN 325 MG PO TABS
ORAL_TABLET | ORAL | Status: AC
  Filled 2019-04-30: qty 2

## 2019-04-30 MED ORDER — SODIUM CHLORIDE 0.9 % IV SOLN
Freq: Once | INTRAVENOUS | Status: AC
  Administered 2019-04-30: 10:00:00 via INTRAVENOUS
  Filled 2019-04-30: qty 250

## 2019-04-30 MED ORDER — TRASTUZUMAB CHEMO 150 MG IV SOLR
399.0000 mg | Freq: Once | INTRAVENOUS | Status: AC
  Administered 2019-04-30: 399 mg via INTRAVENOUS
  Filled 2019-04-30: qty 19

## 2019-04-30 MED ORDER — PEGFILGRASTIM 6 MG/0.6ML ~~LOC~~ PSKT
PREFILLED_SYRINGE | SUBCUTANEOUS | Status: AC
  Filled 2019-04-30: qty 0.6

## 2019-04-30 MED ORDER — SODIUM CHLORIDE 0.9% FLUSH
10.0000 mL | INTRAVENOUS | Status: DC | PRN
  Administered 2019-04-30: 10 mL
  Filled 2019-04-30: qty 10

## 2019-04-30 MED ORDER — DIPHENHYDRAMINE HCL 25 MG PO CAPS
ORAL_CAPSULE | ORAL | Status: AC
  Filled 2019-04-30: qty 1

## 2019-04-30 MED ORDER — DEXTROSE 5 % IV SOLN
Freq: Once | INTRAVENOUS | Status: AC
  Administered 2019-04-30: 12:00:00 via INTRAVENOUS
  Filled 2019-04-30: qty 250

## 2019-04-30 MED ORDER — SODIUM CHLORIDE 0.9 % IV SOLN
Freq: Once | INTRAVENOUS | Status: AC
  Administered 2019-04-30: 10:00:00 via INTRAVENOUS
  Filled 2019-04-30: qty 5

## 2019-04-30 MED ORDER — OXALIPLATIN CHEMO INJECTION 100 MG/20ML
85.0000 mg/m2 | Freq: Once | INTRAVENOUS | Status: AC
  Administered 2019-04-30: 140 mg via INTRAVENOUS
  Filled 2019-04-30: qty 20

## 2019-04-30 MED ORDER — HEPARIN SOD (PORK) LOCK FLUSH 100 UNIT/ML IV SOLN
500.0000 [IU] | Freq: Once | INTRAVENOUS | Status: AC | PRN
  Administered 2019-04-30: 500 [IU]
  Filled 2019-04-30: qty 5

## 2019-04-30 NOTE — Telephone Encounter (Signed)
Per Suzan Garibaldi NP echo appt made 05/22/19 at 10am. Pt aware through phone. Verbalized understanding.

## 2019-04-30 NOTE — Patient Instructions (Signed)
[facility] Discharge Instructions for Patients Receiving Chemotherapy  Today you received the following chemotherapy agents: Herceptin, Oxaliplatin  To help prevent nausea and vomiting after your treatment, we encourage you to take your nausea medication as directed.   If you develop nausea and vomiting that is not controlled by your nausea medication, call the clinic.   BELOW ARE SYMPTOMS THAT SHOULD BE REPORTED IMMEDIATELY:  *FEVER GREATER THAN 100.5 F  *CHILLS WITH OR WITHOUT FEVER  NAUSEA AND VOMITING THAT IS NOT CONTROLLED WITH YOUR NAUSEA MEDICATION  *UNUSUAL SHORTNESS OF BREATH  *UNUSUAL BRUISING OR BLEEDING  TENDERNESS IN MOUTH AND THROAT WITH OR WITHOUT PRESENCE OF ULCERS  *URINARY PROBLEMS  *BOWEL PROBLEMS  UNUSUAL RASH Items with * indicate a potential emergency and should be followed up as soon as possible.  Feel free to call the clinic should you have any questions or concerns. The clinic phone number is [phone].  Please show the [place] at check-in to the Emergency Department and triage nurse.

## 2019-04-30 NOTE — Progress Notes (Signed)
[place]  Telephone:([phone] Fax:(336) [phone]     ID: MERT DIETRICK DOB: Mar 24, 1935  MR#: [id number]  GYI#:[id number]  Patient Care Team: Cassandria Anger, MD as PCP - General Milus Banister, MD as Attending Physician (Gastroenterology) Magrinat, Virgie Dad, MD as Consulting Physician (Oncology) Gatha Mayer, MD as Consulting Physician (Gastroenterology) [name], MD as Consulting Physician (Ophthalmology) Ramon Dredge, MD as Referring Physician (Radiation Oncology) Larey Dresser, MD as Consulting Physician (Cardiology) OTHER MD:   CHIEF COMPLAINT: Metastatic gastric adenocarcinoma  CURRENT TREATMENT: oxaliplatin, capecitabine, trastuzumab   INTERVAL HISTORY: Ammiel was seen today for follow-up and treatment of his metastatic gastric adenocarcinoma.   He continues on oxaliplatin, capecitabine and trastuzumab. Today is day 1 cycle 9.  Hutchinson is tolerating his treatment well.  He has no current issues.  He is without neuropathy.   His blood pressure is slightly elevated today. He has not yet taken his blood pressure medications.  He typically takes them later in the day when he is taking his capecitabine.    REVIEW OF SYSTEMS: Liston is feeling well.  His grandson is getting married this weekend and he wants to know if he would be able to go.  There are only 10 people invited.    Micha denies any fever or chills.  He says that he has eaten a lot this week and has had a very much increased appetite.  He is without nausea, vomiting, bowel/bladder changes, mucositis, dysphagia.  He has no headaches, vision changes, shortness of breath, cough, chest pain.  A detailed ROS was otherwise non contributory today.    HISTORY OF CURRENT ILLNESS: From the original intake note:  DINESH ULYSSE presented to the emergency room 09/07/2018 with mid/upper abdominal pain.  He reported weight loss of 25 pounds over the past 6 months and  significant anorexia.  His liver function tests were abnormal and a right upper quadrant ultrasound was obtained.  This was read as concerning for malignancy and a CT of the abdomen and pelvis with contrast was obtained the same day, showing  1. Too numerous to count hypodense masses of the liver concerning for hepatic metastasis. In the region of the porta hepatis, there are hypodense lesions more likely associated with the liver though the pancreatic head is partially obscured by a 3.2 cm hypodense mass. Findings are more likely associated with the liver and less likely an isolated pancreatic mass. 2. Upper abdominal mesenteric and peripancreatic lymphadenopathy suspicious for metastatic disease. 3. 6 mm sclerotic density in the left iliac bone adjacent to the SI joint. Given history of prostate cancer, differential considerations would include osteoblastic metastasis or possibly bone island. Showed only diverticular disease.  He was referred to GI and Dr. Ardis Hughs notes a colonoscopy performed 04/23/2017 makes the possibility of colon cancer unlikely.  Further work-up was planned, but on 09/19/2018 the patient again presented to the emergency room with chest pain, now in a different area, and a staging CT scan of the chest showed filling defects in the right lower lobe pulmonary arteries consistent with embolism.  There was a small pericardial effusion and an additional 2.4 cm left thyroid mass.  Aortic atherosclerosis was noted  He was started on intravenous heparin and on 09/20/2018 underwent upper endoscopy under Dr. Carlean Purl showing a giant nonbleeding cratered gastric ulcer in the posterior wall of the stomach.  Biopsies of this procedure showed (SZA 20-187) adenocarcinoma.   At that point we were consulted  and we obtained tumor markers which included a CEA of 1318.2, a CA 19-9 of 67,479, a normal AFP and a normal PSA.  I discussed the case with pathology and they do not feel that additional testing  would be helpful in terms of discriminating between a primary gastric and a primary biliary/pancreatic tumor.  The patient's subsequent history is as detailed below.   PAST MEDICAL HISTORY: Past Medical History:  Diagnosis Date  . Arthritis   . Diverticulosis of colon   . Family history of breast cancer   . Family history of prostate cancer   . HTN (hypertension)   . Hx of colonic polyp   . Pneumonia    as a child/baby  . Poor circulation of extremity    right leg  . Prostate cancer ([place]) 2015   prostate cancer - radiation treated     PAST SURGICAL HISTORY: Past Surgical History:  Procedure Laterality Date  . [place] VITRECTOMY WITH 20 GAUGE MVR PORT FOR MACULAR HOLE Right 09/19/2016   Procedure: 25 GAUGE PARS PLANA VITRECTOMY WITH 20 GAUGE MVR PORT FOR MACULAR HOLE, MEMBRANE PEEL, SERUM PATCH, GAS FLUID EXCHANGE, HEADSCOPE LASER;  Surgeon: [name], MD;  Location: [facility];  Service: Ophthalmology;  Laterality: Right;  . BIOPSY  09/20/2018   Procedure: BIOPSY;  Surgeon: Gatha Mayer, MD;  Location: [facility] ENDOSCOPY;  Service: Endoscopy;;  . CATARACT EXTRACTION  08/27/12   Right  . COLONOSCOPY    . ESOPHAGOGASTRODUODENOSCOPY (EGD) WITH PROPOFOL N/A 09/20/2018   Procedure: ESOPHAGOGASTRODUODENOSCOPY (EGD) WITH PROPOFOL;  Surgeon: Gatha Mayer, MD;  Location: [facility];  Service: Endoscopy;  Laterality: N/A;  . HAND SURGERY  2016   right thumb  . IR IMAGING GUIDED PORT INSERTION  11/04/2018  . KNEE SURGERY Left 1989     FAMILY HISTORY: Family History  Problem Relation Age of Onset  . Prostate cancer Father        dx >50  . Hypertension Mother   . Ulcers Mother   . Breast cancer Sister        dx 20's  . Prostate cancer Brother        dx under 93, metasttic, cause of his death  . Ulcers Brother   . Ulcers Sister    Amarion's father died from heart complications with a pacemaker at age 7; Gabe's father also has prostate cancer. Patients' mother  died from natural causes at age 80. The patient has 4 brothers and 3 sisters. One of [place] sisters had breast cancer in her 50s and a brother had prostate cancer. Patient denies anyone in her family having ovarian, or pancreatic cancer. Jakeob mentions that his youngest sister and his brother with prostate cancer also had stomach ulcers.    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 His wife, [name], is a retired [employer] summer Radiographer, therapeutic. As of 09/2018, they have been married for 56 years. They have 4 children, Claiborne Billings, [employer], [place], and [place]. Claiborne Billings lives in [place] and works at the [employer]. Sherrod lives in [place] and works at [employer] and as a part Horticulturist, commercial. Germane lives in [place] and is a Engineer, drilling for a medical company. Beverely Low lives in [place] and works at [employer]. Tyreck has 7 grandchildren and 1 step great grandchild. He attends [employer].     ADVANCED DIRECTIVES: His wife, [name], is automatically his healthcare power of attorney.     HEALTH MAINTENANCE: Social  History   Tobacco Use  . Smoking status: Former Smoker    Years: 4.00    Quit date: 09/12/1967    Years since quitting: 51.6  . Smokeless tobacco: Never Used  Substance Use Topics  . Alcohol use: No  . Drug use: No    Colonoscopy: 2018/[facility]  PSA: 0.54 on 09/18/2018  No Known Allergies  Current Outpatient Medications  Medication Sig Dispense Refill  . amLODipine (NORVASC) 5 MG tablet TAKE ONE TABLET BY MOUTH ONE TIME DAILY  90 tablet 2  . apixaban (ELIQUIS) 2.5 MG TABS tablet Take 1 tablet (2.5 mg total) by mouth 2 (two) times daily. 60 tablet 12  . benazepril (LOTENSIN) 40 MG tablet TAKE ONE TABLET BY MOUTH ONE TIME DAILY  90 tablet 2  . capecitabine (XELODA) 500 MG tablet TAKE 2 TABLETS (1,000 MG TOTAL) BY MOUTH 2 (TWO) TIMES DAILY AFTER A MEAL. TAKE ON DAYS 1-14 OF CHEMOTHERAPY. REPEAT EVERY 21 DAYS. 56 tablet 4  . Cholecalciferol 1000  UNITS tablet Take 1,000 Units by mouth daily.     . ciprofloxacin (CIPRO) 500 MG tablet Take 1 tablet (500 mg total) by mouth 2 (two) times daily. 14 tablet 1  . latanoprost (XALATAN) 0.005 % ophthalmic solution Place 1 drop into both eyes at bedtime.     . lidocaine-prilocaine (EMLA) cream Apply to affected area once 30 g 3  . ondansetron (ZOFRAN) 4 MG tablet Take 1 tablet (4 mg total) by mouth every 8 (eight) hours as needed for nausea or vomiting. 20 tablet 1  . prochlorperazine (COMPAZINE) 10 MG tablet Take 1 tablet (10 mg total) by mouth every 6 (six) hours as needed (Nausea or vomiting). 30 tablet 1   No current facility-administered medications for this visit.    Facility-Administered Medications Ordered in Other Visits  Medication Dose Route Frequency Provider Last Rate Last Dose  . sodium chloride flush (NS) 0.9 % injection 10 mL  10 mL Intravenous PRN Magrinat, Virgie Dad, MD         OBJECTIVE: Elderly African-American man in no acute distress Vitals:   04/30/19 0912  BP: (!) 160/92  Pulse: 85  Temp: 98.7 F (37.1 C)  SpO2: 98%     Body mass index is 25.3 kg/m.   Wt Readings from Last 3 Encounters:  04/30/19 147 lb 6.4 oz (66.9 kg)  04/09/19 145 lb 1.6 oz (65.8 kg)  03/19/19 144 lb 14.4 oz (65.7 kg)  ECOG FS:1 - Symptomatic but completely ambulatory GENERAL: Patient is a well appearing older man in no acute distress HEENT:  Sclerae anicteric.  Oropharynx clear and moist. No ulcerations or evidence of oropharyngeal candidiasis. Neck is supple.  NODES:  No cervical, supraclavicular, or axillary lymphadenopathy palpated.  LUNGS:  Clear to auscultation bilaterally.  No wheezes or rhonchi. HEART:  Regular rate and rhythm. No murmur appreciated. ABDOMEN:  Soft, nontender.  Positive, normoactive bowel sounds. No organomegaly palpated. MSK:  No focal spinal tenderness to palpation. Full range of motion bilaterally in the upper extremities. EXTREMITIES:  No peripheral edema.    SKIN:  Hyperpigmentation noted on hands, + darkening of the nail beds NEURO:  Nonfocal. Well oriented.  Appropriate affect.     LAB RESULTS:  CMP     Component Value Date/Time   NA 138 04/09/2019 1000   K 3.8 04/09/2019 1000   CL 104 04/09/2019 1000   CO2 27 04/09/2019 1000   GLUCOSE 101 (H) 04/09/2019 1000   GLUCOSE 105 (H) 09/13/2006 0900  BUN 14 04/09/2019 1000   CREATININE 0.91 04/09/2019 1000   CREATININE 1.02 10/16/2018 0936   CALCIUM 8.7 (L) 04/09/2019 1000   PROT 6.5 04/09/2019 1000   ALBUMIN 3.4 (L) 04/09/2019 1000   AST 24 04/09/2019 1000   AST 76 (H) 10/16/2018 0936   ALT 18 04/09/2019 1000   ALT 47 (H) 10/16/2018 0936   ALKPHOS 113 04/09/2019 1000   BILITOT 0.5 04/09/2019 1000   BILITOT 0.7 10/16/2018 0936   GFRNONAA >60 04/09/2019 1000   GFRNONAA >60 10/16/2018 0936   GFRAA >60 04/09/2019 1000   GFRAA >60 10/16/2018 0936    No results found for: TOTALPROTELP, ALBUMINELP, A1GS, A2GS, BETS, BETA2SER, GAMS, MSPIKE, SPEI  No results found for: KPAFRELGTCHN, LAMBDASER, KAPLAMBRATIO  Lab Results  Component Value Date   WBC 4.1 04/30/2019   NEUTROABS 2.2 04/30/2019   HGB 9.1 (L) 04/30/2019   HCT 28.4 (L) 04/30/2019   MCV 95.3 04/30/2019   PLT 198 04/30/2019    _0 @  No results found for: LABCA2  No components found for: WUJWJX914  No results for input(s): INR in the last 168 hours.  No results found for: LABCA2  Lab Results  Component Value Date   CAN199 50 (H) 04/09/2019    No results found for: NWG956  No results found for: OZH086  No results found for: CA2729  No components found for: HGQUANT  Lab Results  Component Value Date   CEA1 5.78 (H) 04/09/2019   /  CEA ([facility])  Date Value Ref Range Status  04/09/2019 5.78 (H) 0.00 - 5.00 ng/mL Final    Comment:    (NOTE) This test was performed using Architect's Chemiluminescent Microparticle Immunoassay. Values obtained from different assay methods cannot be  used interchangeably. Please note that 5-10% of patients who smoke may see CEA levels up to 6.9 ng/mL. Performed at [facility], [place] [address] [place], [place] [address]      No results found for: AFPTUMOR  No results found for: [place]  No results found for: PSA1  Appointment on 04/30/2019  Component Date Value Ref Range Status  . WBC 04/30/2019 4.1  4.0 - 10.5 K/uL Final  . RBC 04/30/2019 2.98* 4.22 - 5.81 MIL/uL Final  . Hemoglobin 04/30/2019 9.1* 13.0 - 17.0 g/dL Final  . HCT 04/30/2019 28.4* 39.0 - 52.0 % Final  . MCV 04/30/2019 95.3  80.0 - 100.0 fL Final  . MCH 04/30/2019 30.5  26.0 - 34.0 pg Final  . MCHC 04/30/2019 32.0  30.0 - 36.0 g/dL Final  . RDW 04/30/2019 22.8* 11.5 - 15.5 % Final  . Platelets 04/30/2019 198  150 - 400 K/uL Final  . nRBC 04/30/2019 0.0  0.0 - 0.2 % Final  . Neutrophils Relative % 04/30/2019 52  % Final  . Neutro Abs 04/30/2019 2.2  1.7 - 7.7 K/uL Final  . Lymphocytes Relative 04/30/2019 23  % Final  . Lymphs Abs 04/30/2019 0.9  0.7 - 4.0 K/uL Final  . Monocytes Relative 04/30/2019 19  % Final  . Monocytes Absolute 04/30/2019 0.8  0.1 - 1.0 K/uL Final  . Eosinophils Relative 04/30/2019 4  % Final  . Eosinophils Absolute 04/30/2019 0.2  0.0 - 0.5 K/uL Final  . Basophils Relative 04/30/2019 1  % Final  . Basophils Absolute 04/30/2019 0.1  0.0 - 0.1 K/uL Final  . Immature Granulocytes 04/30/2019 1  % Final  . Abs Immature Granulocytes 04/30/2019 0.02  0.00 - 0.07 K/uL Final  Performed at [facility], [place] [address], [place], [place] [address]    (this displays the last labs from the last 3 days)  No results found for: TOTALPROTELP, ALBUMINELP, A1GS, A2GS, BETS, BETA2SER, GAMS, MSPIKE, SPEI (this displays SPEP labs)  No results found for: KPAFRELGTCHN, LAMBDASER, KAPLAMBRATIO (kappa/lambda light chains)  No results found for: HGBA, HGBA2QUANT, HGBFQUANT, HGBSQUAN (Hemoglobinopathy  evaluation)   No results found for: LDH  Lab Results  Component Value Date   IRON 38 (L) 09/21/2018   TIBC 251 09/21/2018   IRONPCTSAT 15 (L) 09/21/2018   (Iron and TIBC)  Lab Results  Component Value Date   FERRITIN 138 09/21/2018    Urinalysis    Component Value Date/Time   COLORURINE YELLOW 02/18/2019 1056   APPEARANCEUR CLEAR 02/18/2019 1056   LABSPEC 1.018 02/18/2019 1056   PHURINE 6.0 02/18/2019 1056   GLUCOSEU NEGATIVE 02/18/2019 [place] 07/20/2017 1056   HGBUR NEGATIVE 02/18/2019 [place] 02/18/2019 [place] 02/18/2019 1056   PROTEINUR 30 (A) 02/18/2019 1056   UROBILINOGEN 0.2 07/20/2017 1056   NITRITE NEGATIVE 02/18/2019 [place] 02/18/2019 1056     STUDIES:  No results found.   ELIGIBLE FOR AVAILABLE RESEARCH PROTOCOL: no   ASSESSMENT: 84 y.o. [place], [place] man status post gastric biopsy 09/20/2018 showing adenocarcinoma, with a CA 19-9 greater than 65,000; staging studies showing an apparent mass adjacent to the head of the pancreas, innumerable liver lesions, upper abdominal mesenteric and peripancreatic lymphadenopathy, and an isolated sclerotic density in the left iliac bone  (a) genomics requested on gastric biopsy showed the tumor to be HER-2 amplified at 3+, PD-L1 positive, and T p53 mutated.  MSI is stable, mismatch repair status is proficient and the tumor mutational burden is intermediate.  (1) chest CT scan 09/19/2018 shows right lower lobe pulmonary embolism  (a) on intravenous heparin started 09/19/2018, transitioned to apixaban 10/01/2018  (2) genetics testing 10/04/2018: negative  (a) family history of prostate and early breast cancer; patient has a history of prostate cancer  (3) started gemcitabine/Abraxane 10/09/2018, repeated days 1, 8 and 15 of each 28-day cycle  (a) stopped after 1 cycle (3 doses) with reassessment of diagnosis based on CARIS results, noted  above  (4) to start oxaliplatin, capecitabine and trastuzumab 11/06/2018  (a) baseline echocardiogram 09/20/2018 shows an ejection fraction in the 55-60% range  (b) oxaliplatin and trastuzumab every 21 days started 11/06/2018  (c) capecitabine dose 1000 mg twice daily for 14 days of every 21-day cycle  (d) CT of the abdomen and pelvis 02/11/2019 shows marked improvement in his liver lesions and resolution of periportal adenopathy  (e) tumor markers also show significant response,  (f) echocardiogram 02/12/2019 shows an ejection fraction in the 60-65% range   PLAN: Nicolis is doing well today.  His CBC is stable and CMET is pending.  He will proceed with treatment today with Oxaliplatin and Trastuzumab.  He has already started his BID Capecitabine that is 2 weeks on and 1 week off.    Brad will undergo restaging in a couple of weeks.  We reviewed that further treatment decisions will be made on those results.    Izell [place] and I reviewed the case of his grandson's wedding.  I told him that it would be low risk of transmission if he kept 6 feet away from others and if he and everyone in attendance and in the wedding wore a mask.  I reminded him that his is of moderate to high risk of complications should he get the coronavirus, and that if he decided to go, he could still be at risk for getting it.  He is going to think about it.  Arie will return in 3 weeks for labs, f/u with Dr. Jana Hakim and his next treatment.  She was recommended to continue with the appropriate pandemic precautions. he knows to call for any questions that may arise between now and his next appointment.  We are happy to see him sooner if needed.  A total of (20) minutes of face-to-face time was spent with this patient with greater than 50% of that time in counseling and care-coordination.    Wilber Bihari, NP 04/30/19 9:36 AM [facility] On[facility] [address] [place], [place]  [address] Tel. [phone]    Fax. [phone]

## 2019-04-30 NOTE — Progress Notes (Signed)
Ok to increase Herceptin to 399mg  based on an increase in weight over time per [place].  Hardie Pulley, PharmD, BCPS, BCOP

## 2019-05-01 LAB — CANCER ANTIGEN 19-9: CA 19-9: 43 U/mL — ABNORMAL HIGH (ref 0–35)

## 2019-05-15 ENCOUNTER — Other Ambulatory Visit: Payer: Self-pay

## 2019-05-15 ENCOUNTER — Encounter (HOSPITAL_COMMUNITY): Payer: Self-pay

## 2019-05-15 ENCOUNTER — Ambulatory Visit (HOSPITAL_COMMUNITY)
Admission: RE | Admit: 2019-05-15 | Discharge: 2019-05-15 | Disposition: A | Discharge status: Home / Self Care | Payer: Medicare Other | Source: Ambulatory Visit | Attending: Oncology | Admitting: Oncology

## 2019-05-15 DIAGNOSIS — C169 Malignant neoplasm of stomach, unspecified: Secondary | ICD-10-CM

## 2019-05-15 DIAGNOSIS — C168 Malignant neoplasm of overlapping sites of stomach: Secondary | ICD-10-CM | POA: Diagnosis present

## 2019-05-15 DIAGNOSIS — C799 Secondary malignant neoplasm of unspecified site: Secondary | ICD-10-CM | POA: Diagnosis present

## 2019-05-15 DIAGNOSIS — C787 Secondary malignant neoplasm of liver and intrahepatic bile duct: Secondary | ICD-10-CM | POA: Insufficient documentation

## 2019-05-15 MED ORDER — IOHEXOL 300 MG/ML  SOLN
100.0000 mL | Freq: Once | INTRAMUSCULAR | Status: AC | PRN
  Administered 2019-05-15: 11:00:00 100 mL via INTRAVENOUS

## 2019-05-15 MED ORDER — SODIUM CHLORIDE (PF) 0.9 % IJ SOLN
INTRAMUSCULAR | Status: AC
  Filled 2019-05-15: qty 50

## 2019-05-15 MED ORDER — HEPARIN SOD (PORK) LOCK FLUSH 100 UNIT/ML IV SOLN
INTRAVENOUS | Status: AC
  Filled 2019-05-15: qty 5

## 2019-05-15 MED ORDER — HEPARIN SOD (PORK) LOCK FLUSH 100 UNIT/ML IV SOLN
500.0000 [IU] | Freq: Once | INTRAVENOUS | Status: AC
  Administered 2019-05-15: 11:00:00 500 [IU] via INTRAVENOUS

## 2019-05-15 MED FILL — CAPECITABINE 500 MG TABS: 500 | 21 days supply | Qty: 56 | Fill #4

## 2019-05-19 NOTE — Progress Notes (Signed)
Peter Wood  [phone] [phone]     ID: Peter Wood DOB: 07-Mar-1935  MR#: [id number]  YTK#:[id number]  Patient Care Team: Cassandria Anger, MD as PCP - General Milus Banister, MD as Attending Physician (Gastroenterology) Henrik Orihuela, Virgie Dad, MD as Consulting Physician (Oncology) Gatha Mayer, MD as Consulting Physician (Gastroenterology) [name], MD as Consulting Physician (Ophthalmology) Ramon Dredge, MD as Referring Physician (Radiation Oncology) Larey Dresser, MD as Consulting Physician (Cardiology) OTHER MD:   CHIEF COMPLAINT: Metastatic gastric adenocarcinoma  CURRENT TREATMENT: [oxaliplatin, capecitabine,] trastuzumab   INTERVAL HISTORY: Peter Wood returns today for follow-up and treatment of his metastatic gastric adenocarcinoma. He was last seen here on 04/30/2019.   He continues on oxaliplatin, capecitabine and trastuzumab.  He tolerates that remarkably well.  He has not had any peripheral neuropathy from the platinum treatment.  He is having a little bit of peeling under the nails from the capecitabine and some coarsening of the palms and soles as expected.  Also some hyperpigmentation.  Otherwise he maintains an excellent functional status and has had no acute side effects from the chemo that he is aware of  Peter Wood's last echocardiogram on 02/12/2019, showed an ejection fraction in the 60% - 65% range.   Since his last visit here, he underwent an abdomen and pelvis CT with contrast on 05/15/2019 showing: Stable to mild improvement in multifocal liver metastases. Similar appearance of soft tissue thickening within the GE junction and gastric cardia. Unchanged borderline porta hepatic lymph node. No new or progressive disease identified within the abdomen or pelvis.  We are following his CEA and CA 19: Results for Peter Wood, Peter Wood (MRN [id number]) as of 05/20/2019 10:47  Ref. Range 01/28/2019 10:46 02/18/2019  10:29 03/19/2019 11:28 04/09/2019 10:00 04/30/2019 09:00  CA 19-9 Latest Ref Range: 0 - 35 U/mL 185 (H) 108 (H) 61 (H) 50 (H) 43 (H)  CEA ([facility]) Latest Ref Range: 0.00 - 5.00 ng/mL 30.78 (H) 15.24 (H) 8.93 (H) 5.78 (H) 6.00 (H)    REVIEW OF SYSTEMS: Nahiem remains very active, with an excellent appetite, good sense of taste, no nausea or vomiting.  He denies cough phlegm production pleurisy or shortness of breath.  There has been no bleeding or bruising from his anticoagulant.  A detailed review of systems today was otherwise stable.     HISTORY OF CURRENT ILLNESS: From the original intake note:  Peter Wood presented to the emergency room 09/07/2018 with mid/upper abdominal pain.  He reported weight loss of 25 pounds over the past 6 months and significant anorexia.  His liver function tests were abnormal and a right upper quadrant ultrasound was obtained.  This was read as concerning for malignancy and a CT of the abdomen and pelvis with contrast was obtained the same day, showing  1. Too numerous to count hypodense masses of the liver concerning for hepatic metastasis. In the region of the porta hepatis, there are hypodense lesions more likely associated with the liver though the pancreatic head is partially obscured by a 3.2 cm hypodense mass. Findings are more likely associated with the liver and less likely an isolated pancreatic mass. 2. Upper abdominal mesenteric and peripancreatic lymphadenopathy suspicious for metastatic disease. 3. 6 mm sclerotic density in the left iliac bone adjacent to the SI joint. Given history of prostate cancer, differential considerations would include osteoblastic metastasis or possibly bone island. Showed only diverticular disease.  He was referred to GI and Dr. Ardis Hughs  notes a colonoscopy performed 04/23/2017 makes the possibility of colon cancer unlikely.  Further work-up was planned, but on 09/19/2018 the patient again presented to the emergency  room with chest pain, now in a different area, and a staging CT scan of the chest showed filling defects in the right lower lobe pulmonary arteries consistent with embolism.  There was a small pericardial effusion and an additional 2.4 cm left thyroid mass.  Aortic atherosclerosis was noted  He was started on intravenous heparin and on 09/20/2018 underwent upper endoscopy under Dr. Carlean Purl showing a giant nonbleeding cratered gastric ulcer in the posterior wall of the stomach.  Biopsies of this procedure showed (SZA 20-187) adenocarcinoma.   At that point we were consulted and we obtained tumor markers which included a CEA of 1318.2, a CA 19-9 of 67,479, a normal AFP and a normal PSA.  I discussed the case with pathology and they do not feel that additional testing would be helpful in terms of discriminating between a primary gastric and a primary biliary/pancreatic tumor.  The patient's subsequent history is as detailed below.   PAST MEDICAL HISTORY: Past Medical History:  Diagnosis Date   Arthritis    Diverticulosis of colon    Family history of breast cancer    Family history of prostate cancer    HTN (hypertension)    Hx of colonic polyp    Pneumonia    as a child/baby   Poor circulation of extremity    right leg   Prostate cancer ([place]) 2015   prostate cancer - radiation treated     PAST SURGICAL HISTORY: Past Surgical History:  Procedure Laterality Date   25 GAUGE PARS PLANA VITRECTOMY WITH 20 GAUGE MVR PORT FOR MACULAR HOLE Right 09/19/2016   Procedure: 25 GAUGE PARS PLANA VITRECTOMY WITH 20 GAUGE MVR PORT FOR MACULAR HOLE, MEMBRANE PEEL, SERUM PATCH, GAS FLUID EXCHANGE, HEADSCOPE LASER;  Surgeon: [name], MD;  Location: [facility];  Service: Ophthalmology;  Laterality: Right;   BIOPSY  09/20/2018   Procedure: BIOPSY;  Surgeon: Gatha Mayer, MD;  Location: [facility];  Service: Endoscopy;;   CATARACT EXTRACTION  08/27/12   Right   COLONOSCOPY      ESOPHAGOGASTRODUODENOSCOPY (EGD) WITH PROPOFOL N/A 09/20/2018   Procedure: ESOPHAGOGASTRODUODENOSCOPY (EGD) WITH PROPOFOL;  Surgeon: Gatha Mayer, MD;  Location: [facility];  Service: Endoscopy;  Laterality: N/A;   HAND SURGERY  2016   right thumb   IR IMAGING GUIDED PORT INSERTION  11/04/2018   KNEE SURGERY Left 1989     FAMILY HISTORY: Family History  Problem Relation Age of Onset   Prostate cancer Father        dx >50   Hypertension Mother    Ulcers Mother    Breast cancer Sister        dx 20's   Prostate cancer Brother        dx under 20, metasttic, cause of his death   Ulcers Brother    Ulcers Sister    Jamael's father died from heart complications with a pacemaker at age 65; Einar's father also has prostate cancer. Patients' mother died from natural causes at age 66. The patient has 4 brothers and 3 sisters. One of Sumner sisters had breast cancer in her 71s and a brother had prostate cancer. Patient denies anyone in her family having ovarian, or pancreatic cancer. Kassim mentions that his youngest sister and his brother with prostate cancer also had stomach ulcers.  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 His wife, [name], is a retired [employer] summer Radiographer, therapeutic. As of 09/2018, they have been married for 56 years. They have 4 children, Claiborne Billings, [place], [place], and [place]. Claiborne Billings lives in [place] and works at the [employer]. Sherrod lives in [place] and works at [employer] and as a part Horticulturist, commercial. 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 Beverely Low lives in [place] and works at [employer]. Kaleth has 7 grandchildren and 1 step great grandchild. He attends [employer].     ADVANCED DIRECTIVES: His wife, [name], is automatically his healthcare power of attorney.     HEALTH MAINTENANCE: Social History   Tobacco Use   Smoking status: Former Smoker    Years: 4.00    Quit date:  09/12/1967    Years since quitting: 51.7   Smokeless tobacco: Never Used  Substance Use Topics   Alcohol use: No   Drug use: No    Colonoscopy: 2018/[facility]  PSA: 0.54 on 09/18/2018  No Known Allergies  Current Outpatient Medications  Medication Sig Dispense Refill   amLODipine (NORVASC) 5 MG tablet TAKE ONE TABLET BY MOUTH ONE TIME DAILY  90 tablet 2   apixaban (ELIQUIS) 2.5 MG TABS tablet Take 1 tablet (2.5 mg total) by mouth 2 (two) times daily. 60 tablet 12   benazepril (LOTENSIN) 40 MG tablet TAKE ONE TABLET BY MOUTH ONE TIME DAILY  90 tablet 2   capecitabine (XELODA) 500 MG tablet TAKE 2 TABLETS (1,000 MG TOTAL) BY MOUTH 2 (TWO) TIMES DAILY AFTER A MEAL. TAKE ON DAYS 1-14 OF CHEMOTHERAPY. REPEAT EVERY 21 DAYS. 56 tablet 4   Cholecalciferol 1000 UNITS tablet Take 1,000 Units by mouth daily.      ciprofloxacin (CIPRO) 500 MG tablet Take 1 tablet (500 mg total) by mouth 2 (two) times daily. 14 tablet 1   latanoprost (XALATAN) 0.005 % ophthalmic solution Place 1 drop into both eyes at bedtime.      lidocaine-prilocaine (EMLA) cream Apply to affected area once 30 g 3   ondansetron (ZOFRAN) 4 MG tablet Take 1 tablet (4 mg total) by mouth every 8 (eight) hours as needed for nausea or vomiting. 20 tablet 1   prochlorperazine (COMPAZINE) 10 MG tablet Take 1 tablet (10 mg total) by mouth every 6 (six) hours as needed (Nausea or vomiting). 30 tablet 1   No current facility-administered medications for this visit.    Facility-Administered Medications Ordered in Other Visits  Medication Dose Route Frequency Provider Last Rate Last Dose   sodium chloride flush (NS) 0.9 % injection 10 mL  10 mL Intravenous PRN Eulonda Andalon, Virgie Dad, MD         OBJECTIVE: Elderly African-American man who appears stated age  84:   05/20/19 1012  BP: (!) 149/78  Pulse: 66  Resp: 18  Temp: 99.1 F (37.3 C)  SpO2: 99%   Wt Readings from Last 3 Encounters:  05/20/19 143 lb 11.2 oz (65.2 kg)    04/30/19 147 lb 6.4 oz (66.9 kg)  04/09/19 145 lb 1.6 oz (65.8 kg)   Body mass index is 24.67 kg/m.    ECOG FS:1 - Symptomatic but completely ambulatory  Sclerae unicteric, EOMs intact Wearing a mask No cervical or supraclavicular adenopathy Lungs no rales or rhonchi Heart regular rate and rhythm Abd soft, nontender, positive bowel sounds MSK no focal spinal tenderness, no upper extremity lymphedema Neuro: nonfocal, well oriented,  appropriate affect   LAB RESULTS:  CMP     Component Value Date/Time   NA 139 04/30/2019 0900   K 3.9 04/30/2019 0900   CL 105 04/30/2019 0900   CO2 26 04/30/2019 0900   GLUCOSE 114 (H) 04/30/2019 0900   GLUCOSE 105 (H) 09/13/2006 0900   BUN 14 04/30/2019 0900   CREATININE 0.90 04/30/2019 0900   CREATININE 1.02 10/16/2018 0936   CALCIUM 8.4 (L) 04/30/2019 0900   PROT 6.2 (L) 04/30/2019 0900   ALBUMIN 3.4 (L) 04/30/2019 0900   AST 23 04/30/2019 0900   AST 76 (H) 10/16/2018 0936   ALT 18 04/30/2019 0900   ALT 47 (H) 10/16/2018 0936   ALKPHOS 108 04/30/2019 0900   BILITOT 0.5 04/30/2019 0900   BILITOT 0.7 10/16/2018 0936   GFRNONAA >60 04/30/2019 0900   GFRNONAA >60 10/16/2018 0936   GFRAA >60 04/30/2019 0900   GFRAA >60 10/16/2018 0936    No results found for: TOTALPROTELP, ALBUMINELP, A1GS, A2GS, BETS, BETA2SER, GAMS, MSPIKE, SPEI  No results found for: KPAFRELGTCHN, LAMBDASER, KAPLAMBRATIO  Lab Results  Component Value Date   WBC 4.4 05/20/2019   NEUTROABS 2.5 05/20/2019   HGB 9.6 (L) 05/20/2019   HCT 29.0 (L) 05/20/2019   MCV 93.9 05/20/2019   PLT 199 05/20/2019    '@LASTCHEMISTRY'$ @  No results found for: LABCA2  No components found for: CBSWHQ759  No results for input(s): INR in the last 168 hours.  No results found for: LABCA2  Lab Results  Component Value Date   FMB846 26 (H) 04/30/2019    No results found for: KZL935  No results found for: TSV779  No results found for: CA2729  No components found for:  HGQUANT  Lab Results  Component Value Date   CEA1 6.00 (H) 04/30/2019   /  CEA ([facility])  Date Value Ref Range Status  04/30/2019 6.00 (H) 0.00 - 5.00 ng/mL Final    Comment:    (NOTE) This test was performed using Architect's Chemiluminescent Microparticle Immunoassay. Values obtained from different assay methods cannot be used interchangeably. Please note that 5-10% of patients who smoke may see CEA levels up to 6.9 ng/mL. Performed at [facility], [place] [address], [place], [place] [address]      No results found for: AFPTUMOR  No results found for: [place]  No results found for: PSA1  Appointment on 05/20/2019  Component Date Value Ref Range Status   WBC 05/20/2019 4.4  4.0 - 10.5 K/uL Final   RBC 05/20/2019 3.09* 4.22 - 5.81 MIL/uL Final   Hemoglobin 05/20/2019 9.6* 13.0 - 17.0 g/dL Final   HCT 05/20/2019 29.0* 39.0 - 52.0 % Final   MCV 05/20/2019 93.9  80.0 - 100.0 fL Final   MCH 05/20/2019 31.1  26.0 - 34.0 pg Final   MCHC 05/20/2019 33.1  30.0 - 36.0 g/dL Final   RDW 05/20/2019 23.3* 11.5 - 15.5 % Final   Platelets 05/20/2019 199  150 - 400 K/uL Final   nRBC 05/20/2019 0.0  0.0 - 0.2 % Final   Neutrophils Relative % 05/20/2019 56  % Final   Neutro Abs 05/20/2019 2.5  1.7 - 7.7 K/uL Final   Lymphocytes Relative 05/20/2019 24  % Final   Lymphs Abs 05/20/2019 1.0  0.7 - 4.0 K/uL Final   Monocytes Relative 05/20/2019 14  % Final   Monocytes Absolute 05/20/2019 0.6  0.1 - 1.0 K/uL Final   Eosinophils Relative 05/20/2019 4  % Final  Eosinophils Absolute 05/20/2019 0.2  0.0 - 0.5 K/uL Final   Basophils Relative 05/20/2019 1  % Final   Basophils Absolute 05/20/2019 0.1  0.0 - 0.1 K/uL Final   Immature Granulocytes 05/20/2019 1  % Final   Abs Immature Granulocytes 05/20/2019 0.02  0.00 - 0.07 K/uL Final   Performed at [facility], [place] [address] [place], [place] [address]    (this  displays the last labs from the last 3 days)  No results found for: TOTALPROTELP, ALBUMINELP, A1GS, A2GS, BETS, BETA2SER, GAMS, MSPIKE, SPEI (this displays SPEP labs)  No results found for: KPAFRELGTCHN, LAMBDASER, KAPLAMBRATIO (kappa/lambda light chains)  No results found for: HGBA, HGBA2QUANT, HGBFQUANT, HGBSQUAN (Hemoglobinopathy evaluation)   No results found for: LDH  Lab Results  Component Value Date   IRON 38 (L) 09/21/2018   TIBC 251 09/21/2018   IRONPCTSAT 15 (L) 09/21/2018   (Iron and TIBC)  Lab Results  Component Value Date   FERRITIN 138 09/21/2018    Urinalysis    Component Value Date/Time   COLORURINE YELLOW 02/18/2019 1056   APPEARANCEUR CLEAR 02/18/2019 1056   LABSPEC 1.018 02/18/2019 1056   PHURINE 6.0 02/18/2019 1056   GLUCOSEU NEGATIVE 02/18/2019 1056   GLUCOSEU NEGATIVE 07/20/2017 1056   HGBUR NEGATIVE 02/18/2019 [place] 02/18/2019 [place] 02/18/2019 1056   PROTEINUR 30 (A) 02/18/2019 1056   UROBILINOGEN 0.2 07/20/2017 1056   NITRITE NEGATIVE 02/18/2019 [place] 02/18/2019 1056     STUDIES:  Ct Abdomen Pelvis W Contrast  Result Date: 05/15/2019 CLINICAL DATA:  Restaging metastatic gastric adenocarcinoma. EXAM: CT ABDOMEN AND PELVIS WITH CONTRAST TECHNIQUE: Multidetector CT imaging of the abdomen and pelvis was performed using the standard protocol following bolus administration of intravenous contrast. CONTRAST:  126m OMNIPAQUE IOHEXOL 300 MG/ML  SOLN COMPARISON:  02/11/2019 FINDINGS: Lower chest: Similar appearance of interstitial thickening within the lung bases. No acute abnormality. Hepatobiliary: Multifocal liver lesions are identified. Index lesion in central right lobe of liver measures 2.9 cm, image 10/2. Unchanged. -index left lobe of liver lesion measures 0.9 cm, image 11/2. Previously 1 cm. -the index lesion within inferior right hepatic lobe measures 7 mm, image 27/2. 0.9 cm.  -gallbladder normal. No intrahepatic bile duct dilatation. Common bile duct is unremarkable. Posterior right lobe of liver lesion measures 1 cm, image 7/2. Previously 1.3 cm. Pancreas: Unremarkable. No pancreatic ductal dilatation or surrounding inflammatory changes. Spleen: Normal in size without focal abnormality. Adrenals/Urinary Tract: Normal appearance of the adrenal glands. No hydronephrosis. Small low-attenuation kidney lesions are identified measuring less than 1 cm, these are too small to characterize. Unchanged from previous exam however period no mass or hydronephrosis identified. Urinary bladder unremarkable. Stomach/Bowel: 2 soft tissue thickening at the GE junction extending into the cardia is unchanged measuring 2.5 by 3.9 cm, image number 10/2. The stomach is nondistended. The small bowel loops are unremarkable. No pathologic dilatation of the colon. Vascular/Lymphatic: No significant vascular findings are present. The index porta hepatic node measures 1.2 cm, image 17/2. Unchanged. No new or progressive adenopathy identified. Reproductive: Prostate is unremarkable. Other: No abdominal wall hernia or abnormality. No abdominopelvic ascites. Musculoskeletal: Degenerative disc disease identified within the lumbar spine. No acute or suspicious osseous abnormality. IMPRESSION: 1. Stable to mild improvement in multifocal liver metastases. 2. Similar appearance of soft tissue thickening within the GE junction and gastric cardia. 3. Unchanged borderline porta hepatic lymph node. 4. No new or progressive disease  identified within the abdomen or pelvis. Electronically Signed   By: Kerby Moors M.D.   On: 05/15/2019 12:09     ELIGIBLE FOR AVAILABLE RESEARCH PROTOCOL: no   ASSESSMENT: 84 y.o. [place], [place] man status post gastric biopsy 09/20/2018 showing adenocarcinoma, with a CA 19-9 greater than 65,000; staging studies showing an apparent mass adjacent to the head of the pancreas, innumerable liver  lesions, upper abdominal mesenteric and peripancreatic lymphadenopathy, and an isolated sclerotic density in the left iliac bone  (a) genomics requested on gastric biopsy showed the tumor to be HER-2 amplified at 3+, PD-L1 positive, and T p53 mutated.  MSI is stable, mismatch repair status is proficient and the tumor mutational burden is intermediate.  (1) chest CT scan 09/19/2018 shows right lower lobe pulmonary embolism  (a) on intravenous heparin started 09/19/2018, transitioned to apixaban 10/01/2018  (2) genetics testing 10/04/2018: negative  (a) family history of prostate and early breast cancer; patient has a history of prostate cancer  (3) started gemcitabine/Abraxane 10/09/2018, repeated days 1, 8 and 15 of each 28-day cycle  (a) stopped after 1 cycle (3 doses) with reassessment of diagnosis based on CARIS results, noted above  (4) started oxaliplatin, capecitabine and trastuzumab 11/06/2018  (a) baseline echocardiogram 09/20/2018 shows an ejection fraction in the 55-60% range  (b) oxaliplatin and trastuzumab every 21 days started 11/06/2018  (c) capecitabine dose 1000 mg twice daily for 14 days of every 21-day cycle  (d) CT of the abdomen and pelvis 02/11/2019 shows marked improvement in his liver lesions and resolution of periportal adenopathy  (e) tumor markers also show significant response,  (f) echocardiogram 02/12/2019 shows an ejection fraction in the 60-65% range  (g) CT abd/pelvis show stable/ improved disease, tumor markers nearly normal  (h) oxaliplatin stopped aftwer 04/30/2019 dose  (i) capecitabine stopped after September cycle   (5) trastuzumab maintenance started 05/20/2019   PLAN: Kerrie is now about 9 months out from initial diagnosis of metastatic gastric carcinoma.  His cancer is well controlled.  He is tolerating his treatment remarkably well.  His scan last week shows generally stable disease, slight improvement.  I think we have probably gotten as much  measurable response as we are going to get at this point from his oxaliplatin/trastuzumab treatment.  We are stopping the oxaliplatin as of now.  He just received 14 days of capecitabine and he will take those but after that we will continue with trastuzumab alone.  He will start trastuzumab maintenance today.  He has an echocardiogram already scheduled for 05/22/2019.  We will follow his CEA and CA 19 every 21 days.  He will return to see me in November and before that visit he will have a restaging scan.  I am delighted at how well he has done.  If we can maintain his response without chemotherapy that would be optimal.  Otherwise we certainly can go back to the oxaliplatin/capecitabine since he tolerated those generally quite well  Trayce Maino, Virgie Dad, MD  05/20/19 10:45 AM Medical Oncology and Hematology [facility] [place], [place] [address] Tel. [phone]    Fax. [phone]  I, Jacqualyn Posey am acting as a Education administrator for Chauncey Cruel, MD.   I, Lurline Del MD, have reviewed the above documentation for accuracy and completeness, and I agree with the above.

## 2019-05-20 ENCOUNTER — Inpatient Hospital Stay: Payer: Medicare Other

## 2019-05-20 ENCOUNTER — Inpatient Hospital Stay (HOSPITAL_BASED_OUTPATIENT_CLINIC_OR_DEPARTMENT_OTHER): Payer: Medicare Other | Admitting: Oncology

## 2019-05-20 ENCOUNTER — Other Ambulatory Visit: Payer: Self-pay

## 2019-05-20 ENCOUNTER — Inpatient Hospital Stay: Payer: Medicare Other | Attending: Oncology

## 2019-05-20 VITALS — BP 149/78 | HR 66 | Temp 99.1°F | Resp 18 | Wt 143.7 lb

## 2019-05-20 DIAGNOSIS — C787 Secondary malignant neoplasm of liver and intrahepatic bile duct: Secondary | ICD-10-CM

## 2019-05-20 DIAGNOSIS — C799 Secondary malignant neoplasm of unspecified site: Secondary | ICD-10-CM

## 2019-05-20 DIAGNOSIS — C169 Malignant neoplasm of stomach, unspecified: Secondary | ICD-10-CM | POA: Diagnosis present

## 2019-05-20 DIAGNOSIS — Z86711 Personal history of pulmonary embolism: Secondary | ICD-10-CM | POA: Insufficient documentation

## 2019-05-20 DIAGNOSIS — C61 Malignant neoplasm of prostate: Secondary | ICD-10-CM

## 2019-05-20 DIAGNOSIS — Z79899 Other long term (current) drug therapy: Secondary | ICD-10-CM | POA: Insufficient documentation

## 2019-05-20 DIAGNOSIS — C163 Malignant neoplasm of pyloric antrum: Secondary | ICD-10-CM

## 2019-05-20 DIAGNOSIS — Z87891 Personal history of nicotine dependence: Secondary | ICD-10-CM | POA: Diagnosis not present

## 2019-05-20 DIAGNOSIS — Z7901 Long term (current) use of anticoagulants: Secondary | ICD-10-CM | POA: Diagnosis not present

## 2019-05-20 DIAGNOSIS — Z5112 Encounter for antineoplastic immunotherapy: Secondary | ICD-10-CM | POA: Insufficient documentation

## 2019-05-20 DIAGNOSIS — Z7189 Other specified counseling: Secondary | ICD-10-CM

## 2019-05-20 DIAGNOSIS — C168 Malignant neoplasm of overlapping sites of stomach: Secondary | ICD-10-CM

## 2019-05-20 DIAGNOSIS — D649 Anemia, unspecified: Secondary | ICD-10-CM

## 2019-05-20 DIAGNOSIS — Z5111 Encounter for antineoplastic chemotherapy: Secondary | ICD-10-CM | POA: Diagnosis present

## 2019-05-20 DIAGNOSIS — Z95828 Presence of other vascular implants and grafts: Secondary | ICD-10-CM

## 2019-05-20 LAB — COMPREHENSIVE METABOLIC PANEL
ALT: 17 U/L (ref 0–44)
AST: 25 U/L (ref 15–41)
Albumin: 3.7 g/dL (ref 3.5–5.0)
Alkaline Phosphatase: 99 U/L (ref 38–126)
Anion gap: 8 (ref 5–15)
BUN: 12 mg/dL (ref 8–23)
CO2: 27 mmol/L (ref 22–32)
Calcium: 8.7 mg/dL — ABNORMAL LOW (ref 8.9–10.3)
Chloride: 104 mmol/L (ref 98–111)
Creatinine, Ser: 1 mg/dL (ref 0.61–1.24)
GFR calc Af Amer: >60 mL/min (ref >60)
GFR calc non Af Amer: >60 mL/min (ref >60)
Glucose, Bld: 126 mg/dL — ABNORMAL HIGH (ref 70–99)
Potassium: 3.9 mmol/L (ref 3.5–5.1)
Sodium: 139 mmol/L (ref 135–145)
Total Bilirubin: 0.7 mg/dL (ref 0.3–1.2)
Total Protein: 6.4 g/dL — ABNORMAL LOW (ref 6.5–8.1)

## 2019-05-20 LAB — CBC WITH DIFFERENTIAL/PLATELET
Abs Immature Granulocytes: 0.02 10*3/uL (ref 0.00–0.07)
Basophils Absolute: 0.1 10*3/uL (ref 0.0–0.1)
Basophils Relative: 1 %
Eosinophils Absolute: 0.2 10*3/uL (ref 0.0–0.5)
Eosinophils Relative: 4 %
HCT: 29 % — ABNORMAL LOW (ref 39.0–52.0)
Hemoglobin: 9.6 g/dL — ABNORMAL LOW (ref 13.0–17.0)
Immature Granulocytes: 1 %
Lymphocytes Relative: 24 %
Lymphs Abs: 1 10*3/uL (ref 0.7–4.0)
MCH: 31.1 pg (ref 26.0–34.0)
MCHC: 33.1 g/dL (ref 30.0–36.0)
MCV: 93.9 fL (ref 80.0–100.0)
Monocytes Absolute: 0.6 10*3/uL (ref 0.1–1.0)
Monocytes Relative: 14 %
Neutro Abs: 2.5 10*3/uL (ref 1.7–7.7)
Neutrophils Relative %: 56 %
Platelets: 199 10*3/uL (ref 150–400)
RBC: 3.09 MIL/uL — ABNORMAL LOW (ref 4.22–5.81)
RDW: 23.3 % — ABNORMAL HIGH (ref 11.5–15.5)
WBC: 4.4 10*3/uL (ref 4.0–10.5)
nRBC: 0 % (ref 0.0–0.2)

## 2019-05-20 LAB — CEA (IN HOUSE-CHCC): CEA (CHCC-In House): 8.16 ng/mL — ABNORMAL HIGH (ref 0.00–5.00)

## 2019-05-20 MED ORDER — TRASTUZUMAB-DKST CHEMO 150 MG IV SOLR: 6.0000 mg/kg | Freq: Once | INTRAVENOUS | Status: DC

## 2019-05-20 MED ORDER — SODIUM CHLORIDE 0.9 % IV SOLN
Freq: Once | INTRAVENOUS | Status: AC
  Administered 2019-05-20: 12:00:00 via INTRAVENOUS
  Filled 2019-05-20: qty 250

## 2019-05-20 MED ORDER — DIPHENHYDRAMINE HCL 25 MG PO CAPS
ORAL_CAPSULE | ORAL | Status: AC
  Filled 2019-05-20: qty 1

## 2019-05-20 MED ORDER — TRASTUZUMAB CHEMO 150 MG IV SOLR
6.0000 mg/kg | Freq: Once | INTRAVENOUS | Status: AC
  Administered 2019-05-20: 399 mg via INTRAVENOUS
  Filled 2019-05-20: qty 19

## 2019-05-20 MED ORDER — HEPARIN SOD (PORK) LOCK FLUSH 100 UNIT/ML IV SOLN
500.0000 [IU] | Freq: Once | INTRAVENOUS | Status: AC | PRN
  Administered 2019-05-20: 500 [IU]
  Filled 2019-05-20: qty 5

## 2019-05-20 MED ORDER — ACETAMINOPHEN 325 MG PO TABS
650.0000 mg | ORAL_TABLET | Freq: Once | ORAL | Status: AC
  Administered 2019-05-20: 650 mg via ORAL

## 2019-05-20 MED ORDER — SODIUM CHLORIDE 0.9% FLUSH
10.0000 mL | INTRAVENOUS | Status: DC | PRN
  Administered 2019-05-20: 10 mL
  Filled 2019-05-20: qty 10

## 2019-05-20 MED ORDER — DIPHENHYDRAMINE HCL 25 MG PO CAPS
25.0000 mg | ORAL_CAPSULE | Freq: Once | ORAL | Status: AC
  Administered 2019-05-20: 25 mg via ORAL

## 2019-05-20 MED ORDER — ACETAMINOPHEN 325 MG PO TABS
ORAL_TABLET | ORAL | Status: AC
  Filled 2019-05-20: qty 2

## 2019-05-20 MED ORDER — SODIUM CHLORIDE 0.9% FLUSH
10.0000 mL | Freq: Once | INTRAVENOUS | Status: AC
  Administered 2019-05-20: 10 mL
  Filled 2019-05-20: qty 10

## 2019-05-20 NOTE — Patient Instructions (Addendum)
Trastuzumab injection for infusion What is this medicine? TRASTUZUMAB (tras TOO zoo mab) is a monoclonal antibody. It is used to treat breast cancer and stomach cancer. This medicine may be used for other purposes; ask your health care provider or pharmacist if you have questions. COMMON BRAND NAME(S): Herceptin, Herzuma, KANJINTI, Ogivri, Ontruzant, Trazimera What should I tell my health care provider before I take this medicine? They need to know if you have any of these conditions:  heart disease  heart failure  lung or breathing disease, like asthma  an unusual or allergic reaction to trastuzumab, benzyl alcohol, or other medications, foods, dyes, or preservatives  pregnant or trying to get pregnant  breast-feeding How should I use this medicine? This drug is given as an infusion into a vein. It is administered in a hospital or clinic by a specially trained health care professional. Talk to your pediatrician regarding the use of this medicine in children. This medicine is not approved for use in children. Overdosage: If you think you have taken too much of this medicine contact a poison control center or emergency room at once. NOTE: This medicine is only for you. Do not share this medicine with others. What if I miss a dose? It is important not to miss a dose. Call your doctor or health care professional if you are unable to keep an appointment. What may interact with this medicine? This medicine may interact with the following medications:  certain types of chemotherapy, such as daunorubicin, doxorubicin, epirubicin, and idarubicin This list may not describe all possible interactions. Give your health care provider a list of all the medicines, herbs, non-prescription drugs, or dietary supplements you use. Also tell them if you smoke, drink alcohol, or use illegal drugs. Some items may interact with your medicine. What should I watch for while using this medicine? Visit your  doctor for checks on your progress. Report any side effects. Continue your course of treatment even though you feel ill unless your doctor tells you to stop. Call your doctor or health care professional for advice if you get a fever, chills or sore throat, or other symptoms of a cold or flu. Do not treat yourself. Try to avoid being around people who are sick. You may experience fever, chills and shaking during your first infusion. These effects are usually mild and can be treated with other medicines. Report any side effects during the infusion to your health care professional. Fever and chills usually do not happen with later infusions. Do not become pregnant while taking this medicine or for 7 months after stopping it. Women should inform their doctor if they wish to become pregnant or think they might be pregnant. Women of child-bearing potential will need to have a negative pregnancy test before starting this medicine. There is a potential for serious side effects to an unborn child. Talk to your health care professional or pharmacist for more information. Do not breast-feed an infant while taking this medicine or for 7 months after stopping it. Women must use effective birth control with this medicine. What side effects may I notice from receiving this medicine? Side effects that you should report to your doctor or health care professional as soon as possible:  allergic reactions like skin rash, itching or hives, swelling of the face, lips, or tongue  chest pain or palpitations  cough  dizziness  feeling faint or lightheaded, falls  fever  general ill feeling or flu-like symptoms  signs of worsening heart failure like   breathing problems; swelling in your legs and feet  unusually weak or tired Side effects that usually do not require medical attention (report to your doctor or health care professional if they continue or are bothersome):  bone pain  changes in  taste  diarrhea  joint pain  nausea/vomiting  weight loss This list may not describe all possible side effects. Call your doctor for medical advice about side effects. You may report side effects to FDA at 1-800-FDA-1088. Where should I keep my medicine? This drug is given in a hospital or clinic and will not be stored at home. NOTE: This sheet is a summary. It may not cover all possible information. If you have questions about this medicine, talk to your doctor, pharmacist, or health care provider.  2020 Elsevier/Gold Standard (2016-08-22 14:37:52)  Coronavirus (COVID-19) Are you at risk?  Are you at risk for the Coronavirus (COVID-19)?  To be considered HIGH RISK for Coronavirus (COVID-19), you have to meet the following criteria:  . Traveled to [place], [place], [place], [place] or [place]; or in the [place] to [place], [place], [place], or [place]; and have fever, cough, and shortness of breath within the last 2 weeks of travel OR . Been in close contact with a person diagnosed with COVID-19 within the last 2 weeks and have fever, cough, and shortness of breath . IF YOU DO NOT MEET THESE CRITERIA, YOU ARE CONSIDERED LOW RISK FOR COVID-19.  What to do if you are HIGH RISK for COVID-19?  . If you are having a medical emergency, call 911. . Seek medical care right away. Before you go to a doctor's office, urgent care or emergency department, call ahead and tell them about your recent travel, contact with someone diagnosed with COVID-19, and your symptoms. You should receive instructions from your physician's office regarding next steps of care.  . When you arrive at healthcare provider, tell the healthcare staff immediately you have returned from visiting [place], [place], [place], [place] or [place]; or traveled in the [place] to [place], [place], [place], or [place]; in the last two weeks or you have been in close contact with a person diagnosed with COVID-19  in the last 2 weeks.   . Tell the health care staff about your symptoms: fever, cough and shortness of breath. . After you have been seen by a medical provider, you will be either: o Tested for (COVID-19) and discharged home on quarantine except to seek medical care if symptoms worsen, and asked to  - Stay home and avoid contact with others until you get your results (4-5 days)  - Avoid travel on public transportation if possible (such as bus, train, or airplane) or o Sent to the Emergency Department by EMS for evaluation, COVID-19 testing, and possible admission depending on your condition and test results.  What to do if you are LOW RISK for COVID-19?  Reduce your risk of any infection by using the same precautions used for avoiding the common cold or flu:  . Wash your hands often with soap and warm water for at least 20 seconds.  If soap and water are not readily available, use an alcohol-based hand sanitizer with at least 60% alcohol.  . If coughing or sneezing, cover your mouth and nose by coughing or sneezing into the elbow areas of your shirt or coat, into a tissue or into your sleeve (not your hands). . Avoid shaking hands with others and consider head nods   or verbal greetings only. . Avoid touching your eyes, nose, or mouth with unwashed hands.  . Avoid close contact with people who are sick. . Avoid places or events with large numbers of people in one location, like concerts or sporting events. . Carefully consider travel plans you have or are making. . If you are planning any travel outside or inside the [place], visit the CDC's Travelers' Health webpage for the latest health notices. . If you have some symptoms but not all symptoms, continue to monitor at home and seek medical attention if your symptoms worsen. . If you are having a medical emergency, call 911.   ADDITIONAL HEALTHCARE OPTIONS FOR PATIENTS  [facility] / e-Visit:  [email or web address]         [facility]: [phone]  [facility]: [phone]                   [facility]: [phone]   

## 2019-05-21 LAB — CANCER ANTIGEN 19-9: CA 19-9: 50 U/mL — ABNORMAL HIGH (ref 0–35)

## 2019-05-22 ENCOUNTER — Other Ambulatory Visit: Payer: Self-pay

## 2019-05-22 ENCOUNTER — Ambulatory Visit (HOSPITAL_COMMUNITY)
Admission: RE | Admit: 2019-05-22 | Discharge: 2019-05-22 | Disposition: A | Discharge status: Home / Self Care | Payer: Medicare Other | Source: Ambulatory Visit | Attending: Oncology | Admitting: Oncology

## 2019-05-22 DIAGNOSIS — C787 Secondary malignant neoplasm of liver and intrahepatic bile duct: Secondary | ICD-10-CM

## 2019-05-22 DIAGNOSIS — C799 Secondary malignant neoplasm of unspecified site: Secondary | ICD-10-CM

## 2019-05-22 DIAGNOSIS — Z0189 Encounter for other specified special examinations: Secondary | ICD-10-CM | POA: Diagnosis not present

## 2019-05-22 DIAGNOSIS — I10 Essential (primary) hypertension: Secondary | ICD-10-CM | POA: Insufficient documentation

## 2019-05-22 DIAGNOSIS — Z87891 Personal history of nicotine dependence: Secondary | ICD-10-CM | POA: Insufficient documentation

## 2019-05-22 DIAGNOSIS — C169 Malignant neoplasm of stomach, unspecified: Secondary | ICD-10-CM

## 2019-05-22 DIAGNOSIS — C168 Malignant neoplasm of overlapping sites of stomach: Secondary | ICD-10-CM

## 2019-05-22 DIAGNOSIS — I071 Rheumatic tricuspid insufficiency: Secondary | ICD-10-CM | POA: Insufficient documentation

## 2019-05-22 NOTE — Progress Notes (Signed)
  Echocardiogram 2D Echocardiogram has been performed.  Peter Wood 05/22/2019, 10:37 AM

## 2019-06-04 ENCOUNTER — Other Ambulatory Visit: Payer: Self-pay | Admitting: Oncology

## 2019-06-04 DIAGNOSIS — C168 Malignant neoplasm of overlapping sites of stomach: Secondary | ICD-10-CM

## 2019-06-04 DIAGNOSIS — C787 Secondary malignant neoplasm of liver and intrahepatic bile duct: Secondary | ICD-10-CM

## 2019-06-04 DIAGNOSIS — C169 Malignant neoplasm of stomach, unspecified: Secondary | ICD-10-CM

## 2019-06-06 ENCOUNTER — Telehealth: Payer: Self-pay | Admitting: Oncology

## 2019-06-06 NOTE — Telephone Encounter (Signed)
I talk with patient regarding schedule  

## 2019-06-10 ENCOUNTER — Inpatient Hospital Stay: Payer: Medicare Other

## 2019-06-10 ENCOUNTER — Other Ambulatory Visit: Payer: Self-pay

## 2019-06-10 VITALS — BP 151/89 | HR 65 | Temp 99.1°F | Resp 16 | Ht 64.5 in | Wt 146.5 lb

## 2019-06-10 DIAGNOSIS — D649 Anemia, unspecified: Secondary | ICD-10-CM

## 2019-06-10 DIAGNOSIS — C169 Malignant neoplasm of stomach, unspecified: Secondary | ICD-10-CM

## 2019-06-10 DIAGNOSIS — Z95828 Presence of other vascular implants and grafts: Secondary | ICD-10-CM

## 2019-06-10 DIAGNOSIS — Z5112 Encounter for antineoplastic immunotherapy: Secondary | ICD-10-CM | POA: Diagnosis not present

## 2019-06-10 DIAGNOSIS — C168 Malignant neoplasm of overlapping sites of stomach: Secondary | ICD-10-CM

## 2019-06-10 DIAGNOSIS — C799 Secondary malignant neoplasm of unspecified site: Secondary | ICD-10-CM

## 2019-06-10 DIAGNOSIS — C787 Secondary malignant neoplasm of liver and intrahepatic bile duct: Secondary | ICD-10-CM

## 2019-06-10 LAB — CBC WITH DIFFERENTIAL/PLATELET
Abs Immature Granulocytes: 0 10*3/uL (ref 0.00–0.07)
Basophils Absolute: 0 10*3/uL (ref 0.0–0.1)
Basophils Relative: 1 %
Eosinophils Absolute: 0.2 10*3/uL (ref 0.0–0.5)
Eosinophils Relative: 7 %
HCT: 27.9 % — ABNORMAL LOW (ref 39.0–52.0)
Hemoglobin: 9.1 g/dL — ABNORMAL LOW (ref 13.0–17.0)
Immature Granulocytes: 0 %
Lymphocytes Relative: 31 %
Lymphs Abs: 1.1 10*3/uL (ref 0.7–4.0)
MCH: 30.4 pg (ref 26.0–34.0)
MCHC: 32.6 g/dL (ref 30.0–36.0)
MCV: 93.3 fL (ref 80.0–100.0)
Monocytes Absolute: 0.6 10*3/uL (ref 0.1–1.0)
Monocytes Relative: 16 %
Neutro Abs: 1.7 10*3/uL (ref 1.7–7.7)
Neutrophils Relative %: 45 %
Platelets: 183 10*3/uL (ref 150–400)
RBC: 2.99 MIL/uL — ABNORMAL LOW (ref 4.22–5.81)
RDW: 23.8 % — ABNORMAL HIGH (ref 11.5–15.5)
WBC: 3.7 10*3/uL — ABNORMAL LOW (ref 4.0–10.5)
nRBC: 0 % (ref 0.0–0.2)

## 2019-06-10 LAB — COMPREHENSIVE METABOLIC PANEL
ALT: 19 U/L (ref 0–44)
AST: 31 U/L (ref 15–41)
Albumin: 3.6 g/dL (ref 3.5–5.0)
Alkaline Phosphatase: 83 U/L (ref 38–126)
Anion gap: 7 (ref 5–15)
BUN: 14 mg/dL (ref 8–23)
CO2: 27 mmol/L (ref 22–32)
Calcium: 8.5 mg/dL — ABNORMAL LOW (ref 8.9–10.3)
Chloride: 105 mmol/L (ref 98–111)
Creatinine, Ser: 1.02 mg/dL (ref 0.61–1.24)
GFR calc Af Amer: >60 mL/min (ref >60)
GFR calc non Af Amer: >60 mL/min (ref >60)
Glucose, Bld: 102 mg/dL — ABNORMAL HIGH (ref 70–99)
Potassium: 4 mmol/L (ref 3.5–5.1)
Sodium: 139 mmol/L (ref 135–145)
Total Bilirubin: 0.7 mg/dL (ref 0.3–1.2)
Total Protein: 6.2 g/dL — ABNORMAL LOW (ref 6.5–8.1)

## 2019-06-10 LAB — CEA (IN HOUSE-CHCC): CEA (CHCC-In House): 8.7 ng/mL — ABNORMAL HIGH (ref 0.00–5.00)

## 2019-06-10 MED ORDER — SODIUM CHLORIDE 0.9 % IV SOLN
Freq: Once | INTRAVENOUS | Status: AC
  Administered 2019-06-10: 09:00:00 via INTRAVENOUS
  Filled 2019-06-10: qty 250

## 2019-06-10 MED ORDER — DIPHENHYDRAMINE HCL 25 MG PO CAPS
ORAL_CAPSULE | ORAL | Status: AC
  Filled 2019-06-10: qty 1

## 2019-06-10 MED ORDER — TRASTUZUMAB-ANNS CHEMO 150 MG IV SOLR
6.0000 mg/kg | Freq: Once | INTRAVENOUS | Status: AC
  Administered 2019-06-10: 399 mg via INTRAVENOUS
  Filled 2019-06-10: qty 19

## 2019-06-10 MED ORDER — HEPARIN SOD (PORK) LOCK FLUSH 100 UNIT/ML IV SOLN
500.0000 [IU] | Freq: Once | INTRAVENOUS | Status: AC | PRN
  Administered 2019-06-10: 11:00:00 500 [IU]
  Filled 2019-06-10: qty 5

## 2019-06-10 MED ORDER — ACETAMINOPHEN 325 MG PO TABS
650.0000 mg | ORAL_TABLET | Freq: Once | ORAL | Status: AC
  Administered 2019-06-10: 650 mg via ORAL

## 2019-06-10 MED ORDER — SODIUM CHLORIDE 0.9% FLUSH
10.0000 mL | Freq: Once | INTRAVENOUS | Status: AC
  Administered 2019-06-10: 09:00:00 10 mL
  Filled 2019-06-10: qty 10

## 2019-06-10 MED ORDER — ACETAMINOPHEN 325 MG PO TABS
ORAL_TABLET | ORAL | Status: AC
  Filled 2019-06-10: qty 2

## 2019-06-10 MED ORDER — DIPHENHYDRAMINE HCL 25 MG PO CAPS
25.0000 mg | ORAL_CAPSULE | Freq: Once | ORAL | Status: AC
  Administered 2019-06-10: 25 mg via ORAL

## 2019-06-10 MED ORDER — SODIUM CHLORIDE 0.9% FLUSH
10.0000 mL | INTRAVENOUS | Status: DC | PRN
  Administered 2019-06-10: 11:00:00 10 mL
  Filled 2019-06-10: qty 10

## 2019-06-10 NOTE — Patient Instructions (Signed)

## 2019-06-10 NOTE — Patient Instructions (Signed)
[facility] Discharge Instructions for Patients Receiving Chemotherapy  Today you received the following chemotherapy agents: trastuzumab  To help prevent nausea and vomiting after your treatment, we encourage you to take your nausea medication  as prescribed.    If you develop nausea and vomiting that is not controlled by your nausea medication, call the clinic.   BELOW ARE SYMPTOMS THAT SHOULD BE REPORTED IMMEDIATELY:  *FEVER GREATER THAN 100.5 F  *CHILLS WITH OR WITHOUT FEVER  NAUSEA AND VOMITING THAT IS NOT CONTROLLED WITH YOUR NAUSEA MEDICATION  *UNUSUAL SHORTNESS OF BREATH  *UNUSUAL BRUISING OR BLEEDING  TENDERNESS IN MOUTH AND THROAT WITH OR WITHOUT PRESENCE OF ULCERS  *URINARY PROBLEMS  *BOWEL PROBLEMS  UNUSUAL RASH Items with * indicate a potential emergency and should be followed up as soon as possible.  Feel free to call the clinic should you have any questions or concerns. The clinic phone number is [phone].  Please show the CHEMO ALERT CARD at check-in to the Emergency Department and triage nurse.     

## 2019-06-11 LAB — CANCER ANTIGEN 19-9: CA 19-9: 41 U/mL — ABNORMAL HIGH (ref 0–35)

## 2019-06-12 ENCOUNTER — Encounter: Payer: Self-pay | Admitting: Internal Medicine

## 2019-06-12 ENCOUNTER — Ambulatory Visit (INDEPENDENT_AMBULATORY_CARE_PROVIDER_SITE_OTHER): Payer: Medicare Other | Admitting: Internal Medicine

## 2019-06-12 ENCOUNTER — Other Ambulatory Visit: Payer: Self-pay

## 2019-06-12 DIAGNOSIS — C799 Secondary malignant neoplasm of unspecified site: Secondary | ICD-10-CM | POA: Diagnosis not present

## 2019-06-12 DIAGNOSIS — R634 Abnormal weight loss: Secondary | ICD-10-CM

## 2019-06-12 DIAGNOSIS — I10 Essential (primary) hypertension: Secondary | ICD-10-CM | POA: Diagnosis not present

## 2019-06-12 DIAGNOSIS — C787 Secondary malignant neoplasm of liver and intrahepatic bile duct: Secondary | ICD-10-CM

## 2019-06-12 DIAGNOSIS — C169 Malignant neoplasm of stomach, unspecified: Secondary | ICD-10-CM

## 2019-06-12 MED ORDER — APIXABAN 2.5 MG PO TABS: 2.5000 mg | ORAL_TABLET | Freq: Two times a day (BID) | ORAL | 11 refills | Status: DC

## 2019-06-12 NOTE — Assessment & Plan Note (Signed)
Resolved

## 2019-06-12 NOTE — Assessment & Plan Note (Signed)
F/u w/Oncology 

## 2019-06-12 NOTE — Assessment & Plan Note (Signed)
Norvasc, Benazepril 

## 2019-06-12 NOTE — Progress Notes (Signed)
Subjective:  Patient ID: Peter Wood, male    DOB: Jan 26, 1935  Age: 84 y.o. MRN: [id number]  CC: No chief complaint on file.   HPI Peter Wood presents for HTN, cancer - done w/PO chemo; anemia f/u  Outpatient Medications Prior to Visit  Medication Sig Dispense Refill  . amLODipine (NORVASC) 5 MG tablet TAKE ONE TABLET BY MOUTH ONE TIME DAILY  90 tablet 2  . apixaban (ELIQUIS) 2.5 MG TABS tablet Take 1 tablet (2.5 mg total) by mouth 2 (two) times daily. 60 tablet 12  . benazepril (LOTENSIN) 40 MG tablet TAKE ONE TABLET BY MOUTH ONE TIME DAILY  90 tablet 2  . Cholecalciferol 1000 UNITS tablet Take 1,000 Units by mouth daily.     Marland Kitchen latanoprost (XALATAN) 0.005 % ophthalmic solution Place 1 drop into both eyes at bedtime.     . lidocaine-prilocaine (EMLA) cream Apply to affected area once 30 g 3  . ondansetron (ZOFRAN) 4 MG tablet Take 1 tablet (4 mg total) by mouth every 8 (eight) hours as needed for nausea or vomiting. 20 tablet 1  . prochlorperazine (COMPAZINE) 10 MG tablet Take 1 tablet (10 mg total) by mouth every 6 (six) hours as needed (Nausea or vomiting). 30 tablet 1   Facility-Administered Medications Prior to Visit  Medication Dose Route Frequency Provider Last Rate Last Dose  . sodium chloride flush (NS) 0.9 % injection 10 mL  10 mL Intravenous PRN Magrinat, Virgie Dad, MD        ROS: Review of Systems  Constitutional: Negative for appetite change, fatigue and unexpected weight change.  HENT: Negative for congestion, nosebleeds, sneezing, sore throat and trouble swallowing.   Eyes: Negative for itching and visual disturbance.  Respiratory: Negative for cough.   Cardiovascular: Negative for chest pain, palpitations and leg swelling.  Gastrointestinal: Negative for abdominal distention, blood in stool, diarrhea and nausea.  Genitourinary: Negative for frequency and hematuria.  Musculoskeletal: Negative for back pain, gait problem, joint swelling and neck pain.   Skin: Negative for rash.  Neurological: Negative for dizziness, tremors, speech difficulty and weakness.  Psychiatric/Behavioral: Negative for agitation, dysphoric mood, sleep disturbance and suicidal ideas. The patient is not nervous/anxious.     Objective:  BP 134/78 (BP Location: Left Arm, Patient Position: Sitting, Cuff Size: Normal)   Pulse 73   Temp 98.5 F (36.9 C) (Oral)   Ht 5' 4.5" (1.638 m)   Wt 145 lb (65.8 kg)   SpO2 98%   BMI 24.50 kg/m   BP Readings from Last 3 Encounters:  06/12/19 134/78  06/10/19 (!) 151/89  05/20/19 (!) 149/78    Wt Readings from Last 3 Encounters:  06/12/19 145 lb (65.8 kg)  06/10/19 146 lb 8 oz (66.5 kg)  05/20/19 143 lb 11.2 oz (65.2 kg)    Physical Exam Constitutional:      General: He is not in acute distress.    Appearance: He is well-developed.     Comments: NAD  Eyes:     Conjunctiva/sclera: Conjunctivae normal.     Pupils: Pupils are equal, round, and reactive to light.  Neck:     Musculoskeletal: Normal range of motion.     Thyroid: No thyromegaly.     Vascular: No JVD.  Cardiovascular:     Rate and Rhythm: Normal rate and regular rhythm.     Heart sounds: Normal heart sounds. No murmur. No friction rub. No gallop.   Pulmonary:     Effort: Pulmonary effort is  normal. No respiratory distress.     Breath sounds: Normal breath sounds. No wheezing or rales.  Chest:     Chest wall: No tenderness.  Abdominal:     General: Bowel sounds are normal. There is no distension.     Palpations: Abdomen is soft. There is no mass.     Tenderness: There is no abdominal tenderness. There is no guarding or rebound.  Musculoskeletal: Normal range of motion.        General: No tenderness.  Lymphadenopathy:     Cervical: No cervical adenopathy.  Skin:    General: Skin is warm and dry.     Findings: No rash.  Neurological:     Mental Status: He is alert and oriented to person, place, and time.     Cranial Nerves: No cranial nerve  deficit.     Motor: No abnormal muscle tone.     Coordination: Coordination normal.     Gait: Gait normal.     Deep Tendon Reflexes: Reflexes are normal and symmetric.  Psychiatric:        Behavior: Behavior normal.        Thought Content: Thought content normal.        Judgment: Judgment normal.     Lab Results  Component Value Date   WBC 3.7 (L) 06/10/2019   HGB 9.1 (L) 06/10/2019   HCT 27.9 (L) 06/10/2019   PLT 183 06/10/2019   GLUCOSE 102 (H) 06/10/2019   CHOL 193 01/18/2018   TRIG 84.0 01/18/2018   HDL 50.50 01/18/2018   LDLDIRECT 126.3 09/23/2007   LDLCALC 126 (H) 01/18/2018   ALT 19 06/10/2019   AST 31 06/10/2019   NA 139 06/10/2019   K 4.0 06/10/2019   CL 105 06/10/2019   CREATININE 1.02 06/10/2019   BUN 14 06/10/2019   CO2 27 06/10/2019   TSH 0.93 01/18/2018   PSA 0.54 09/18/2018   INR 1.4 11/04/2018    No results found.  Assessment & Plan:   There are no diagnoses linked to this encounter.   No orders of the defined types were placed in this encounter.    Follow-up: No follow-ups on file.  Walker Kehr, MD

## 2019-06-12 NOTE — Assessment & Plan Note (Signed)
F/u w/Dr Magrinat On chemo

## 2019-06-18 NOTE — Telephone Encounter (Signed)
Oral Oncology Patient Advocate Encounter  Mickel Baas from [facility] called and left me a voicemail asking for our fax number to complete the provider form that they are now requiring and also for the ICD-10 code of the patients diagnosis.  I called Mickel Baas back and had to leave her a voicemail.  It appears that the patient is no longer taking Xeloda.  [place] [facility] [place] Phone [phone] Fax [phone] 06/18/2019   9:37 AM

## 2019-06-18 NOTE — Telephone Encounter (Signed)
Oral Oncology Patient Advocate Encounter  Peter Wood called me back and we talked about the patients current treatment.  Peter Wood is covered by the patients grant if he needs assistance with his copay.  I called Peter Wood and left her a detailed voicemail about this. I will send her the grant information if she needs it.  [name] Phone [phone] Fax [phone] 06/18/2019   10:31 AM

## 2019-07-01 ENCOUNTER — Inpatient Hospital Stay: Payer: Medicare Other

## 2019-07-01 ENCOUNTER — Other Ambulatory Visit: Payer: Self-pay

## 2019-07-01 ENCOUNTER — Inpatient Hospital Stay: Payer: Medicare Other | Attending: Oncology

## 2019-07-01 VITALS — BP 136/76 | HR 60 | Temp 99.2°F | Resp 18 | Wt 150.2 lb

## 2019-07-01 DIAGNOSIS — C168 Malignant neoplasm of overlapping sites of stomach: Secondary | ICD-10-CM

## 2019-07-01 DIAGNOSIS — C799 Secondary malignant neoplasm of unspecified site: Secondary | ICD-10-CM

## 2019-07-01 DIAGNOSIS — Z95828 Presence of other vascular implants and grafts: Secondary | ICD-10-CM

## 2019-07-01 DIAGNOSIS — C787 Secondary malignant neoplasm of liver and intrahepatic bile duct: Secondary | ICD-10-CM | POA: Insufficient documentation

## 2019-07-01 DIAGNOSIS — C162 Malignant neoplasm of body of stomach: Secondary | ICD-10-CM | POA: Insufficient documentation

## 2019-07-01 DIAGNOSIS — Z5112 Encounter for antineoplastic immunotherapy: Secondary | ICD-10-CM | POA: Diagnosis present

## 2019-07-01 DIAGNOSIS — C169 Malignant neoplasm of stomach, unspecified: Secondary | ICD-10-CM

## 2019-07-01 DIAGNOSIS — D649 Anemia, unspecified: Secondary | ICD-10-CM

## 2019-07-01 LAB — CBC WITH DIFFERENTIAL/PLATELET
Abs Immature Granulocytes: 0.01 10*3/uL (ref 0.00–0.07)
Basophils Absolute: 0 10*3/uL (ref 0.0–0.1)
Basophils Relative: 1 %
Eosinophils Absolute: 0.2 10*3/uL (ref 0.0–0.5)
Eosinophils Relative: 6 %
HCT: 27.8 % — ABNORMAL LOW (ref 39.0–52.0)
Hemoglobin: 8.9 g/dL — ABNORMAL LOW (ref 13.0–17.0)
Immature Granulocytes: 0 %
Lymphocytes Relative: 31 %
Lymphs Abs: 1.1 10*3/uL (ref 0.7–4.0)
MCH: 28.5 pg (ref 26.0–34.0)
MCHC: 32 g/dL (ref 30.0–36.0)
MCV: 89.1 fL (ref 80.0–100.0)
Monocytes Absolute: 0.5 10*3/uL (ref 0.1–1.0)
Monocytes Relative: 13 %
Neutro Abs: 1.8 10*3/uL (ref 1.7–7.7)
Neutrophils Relative %: 49 %
Platelets: 200 10*3/uL (ref 150–400)
RBC: 3.12 MIL/uL — ABNORMAL LOW (ref 4.22–5.81)
RDW: 23 % — ABNORMAL HIGH (ref 11.5–15.5)
WBC: 3.6 10*3/uL — ABNORMAL LOW (ref 4.0–10.5)
nRBC: 0 % (ref 0.0–0.2)

## 2019-07-01 LAB — COMPREHENSIVE METABOLIC PANEL
ALT: 21 U/L (ref 0–44)
AST: 27 U/L (ref 15–41)
Albumin: 3.4 g/dL — ABNORMAL LOW (ref 3.5–5.0)
Alkaline Phosphatase: 89 U/L (ref 38–126)
Anion gap: 10 (ref 5–15)
BUN: 15 mg/dL (ref 8–23)
CO2: 25 mmol/L (ref 22–32)
Calcium: 8.6 mg/dL — ABNORMAL LOW (ref 8.9–10.3)
Chloride: 105 mmol/L (ref 98–111)
Creatinine, Ser: 1 mg/dL (ref 0.61–1.24)
GFR calc Af Amer: >60 mL/min (ref >60)
GFR calc non Af Amer: >60 mL/min (ref >60)
Glucose, Bld: 103 mg/dL — ABNORMAL HIGH (ref 70–99)
Potassium: 4.1 mmol/L (ref 3.5–5.1)
Sodium: 140 mmol/L (ref 135–145)
Total Bilirubin: 0.6 mg/dL (ref 0.3–1.2)
Total Protein: 6.1 g/dL — ABNORMAL LOW (ref 6.5–8.1)

## 2019-07-01 LAB — CEA (IN HOUSE-CHCC): CEA (CHCC-In House): 12.85 ng/mL — ABNORMAL HIGH (ref 0.00–5.00)

## 2019-07-01 MED ORDER — SODIUM CHLORIDE 0.9% FLUSH
10.0000 mL | Freq: Once | INTRAVENOUS | Status: AC
  Administered 2019-07-01: 10 mL
  Filled 2019-07-01: qty 10

## 2019-07-01 MED ORDER — SODIUM CHLORIDE 0.9 % IV SOLN
Freq: Once | INTRAVENOUS | Status: AC
  Administered 2019-07-01: 09:00:00 via INTRAVENOUS
  Filled 2019-07-01: qty 250

## 2019-07-01 MED ORDER — ACETAMINOPHEN 325 MG PO TABS
650.0000 mg | ORAL_TABLET | Freq: Once | ORAL | Status: AC
  Administered 2019-07-01: 10:00:00 650 mg via ORAL

## 2019-07-01 MED ORDER — TRASTUZUMAB-ANNS CHEMO 150 MG IV SOLR
6.0000 mg/kg | Freq: Once | INTRAVENOUS | Status: AC
  Administered 2019-07-01: 10:00:00 399 mg via INTRAVENOUS
  Filled 2019-07-01: qty 19

## 2019-07-01 MED ORDER — DIPHENHYDRAMINE HCL 25 MG PO CAPS
25.0000 mg | ORAL_CAPSULE | Freq: Once | ORAL | Status: AC
  Administered 2019-07-01: 10:00:00 25 mg via ORAL

## 2019-07-01 MED ORDER — DIPHENHYDRAMINE HCL 25 MG PO CAPS
ORAL_CAPSULE | ORAL | Status: AC
  Filled 2019-07-01: qty 1

## 2019-07-01 MED ORDER — SODIUM CHLORIDE 0.9% FLUSH
10.0000 mL | INTRAVENOUS | Status: DC | PRN
  Administered 2019-07-01: 10 mL
  Filled 2019-07-01: qty 10

## 2019-07-01 MED ORDER — HEPARIN SOD (PORK) LOCK FLUSH 100 UNIT/ML IV SOLN
500.0000 [IU] | Freq: Once | INTRAVENOUS | Status: AC | PRN
  Administered 2019-07-01: 500 [IU]
  Filled 2019-07-01: qty 5

## 2019-07-01 MED ORDER — ACETAMINOPHEN 325 MG PO TABS
ORAL_TABLET | ORAL | Status: AC
  Filled 2019-07-01: qty 2

## 2019-07-01 NOTE — Patient Instructions (Signed)

## 2019-07-01 NOTE — Patient Instructions (Signed)
[facility] Discharge Instructions for Patients Receiving Chemotherapy  Today you received the following chemotherapy agents Trastuzumab (KANJINTI).  To help prevent nausea and vomiting after your treatment, we encourage you to take your nausea medication as prescribed.   If you develop nausea and vomiting that is not controlled by your nausea medication, call the clinic.   BELOW ARE SYMPTOMS THAT SHOULD BE REPORTED IMMEDIATELY:  *FEVER GREATER THAN 100.5 F  *CHILLS WITH OR WITHOUT FEVER  NAUSEA AND VOMITING THAT IS NOT CONTROLLED WITH YOUR NAUSEA MEDICATION  *UNUSUAL SHORTNESS OF BREATH  *UNUSUAL BRUISING OR BLEEDING  TENDERNESS IN MOUTH AND THROAT WITH OR WITHOUT PRESENCE OF ULCERS  *URINARY PROBLEMS  *BOWEL PROBLEMS  UNUSUAL RASH Items with * indicate a potential emergency and should be followed up as soon as possible.  Feel free to call the clinic should you have any questions or concerns. The clinic phone number is [phone].  Please show the CHEMO ALERT CARD at check-in to the Emergency Department and triage nurse.  Coronavirus (COVID-19) Are you at risk?  Are you at risk for the Coronavirus (COVID-19)?  To be considered HIGH RISK for Coronavirus (COVID-19), you have to meet the following criteria:  . Traveled to [place], [place], [place], [place] or [place]; or in the [place] to [place], [place], [place], or [place]; and have fever, cough, and shortness of breath within the last 2 weeks of travel OR . Been in close contact with a person diagnosed with COVID-19 within the last 2 weeks and have fever, cough, and shortness of breath . IF YOU DO NOT MEET THESE CRITERIA, YOU ARE CONSIDERED LOW RISK FOR COVID-19.  What to do if you are HIGH RISK for COVID-19?  . If you are having a medical emergency, call 911. . Seek medical care right away. Before you go to a doctor's office, urgent care or emergency department, call ahead and tell them  about your recent travel, contact with someone diagnosed with COVID-19, and your symptoms. You should receive instructions from your physician's office regarding next steps of care.  . When you arrive at healthcare provider, tell the healthcare staff immediately you have returned from visiting [place], [place], [place], [place] or [place]; or traveled in the [place] to [place], [place], [place], or [place]; in the last two weeks or you have been in close contact with a person diagnosed with COVID-19 in the last 2 weeks.   . Tell the health care staff about your symptoms: fever, cough and shortness of breath. . After you have been seen by a medical provider, you will be either: o Tested for (COVID-19) and discharged home on quarantine except to seek medical care if symptoms worsen, and asked to  - Stay home and avoid contact with others until you get your results (4-5 days)  - Avoid travel on public transportation if possible (such as bus, train, or airplane) or o Sent to the Emergency Department by EMS for evaluation, COVID-19 testing, and possible admission depending on your condition and test results.  What to do if you are LOW RISK for COVID-19?  Reduce your risk of any infection by using the same precautions used for avoiding the common cold or flu:  . Wash your hands often with soap and warm water for at least 20 seconds.  If soap and water are not readily available, use an alcohol-based hand sanitizer with at least 60% alcohol.  . If coughing or   sneezing, cover your mouth and nose by coughing or sneezing into the elbow areas of your shirt or coat, into a tissue or into your sleeve (not your hands). . Avoid shaking hands with others and consider head nods or verbal greetings only. . Avoid touching your eyes, nose, or mouth with unwashed hands.  . Avoid close contact with people who are sick. . Avoid places or events with large numbers of people in one location, like concerts or  sporting events. . Carefully consider travel plans you have or are making. . If you are planning any travel outside or inside the [place], visit the CDC's Travelers' Health webpage for the latest health notices. . If you have some symptoms but not all symptoms, continue to monitor at home and seek medical attention if your symptoms worsen. . If you are having a medical emergency, call 911.   ADDITIONAL HEALTHCARE OPTIONS FOR PATIENTS   Telehealth / e-Visit: [email or web address]         [facility]: [phone]  [facility]: [phone]                   [facility]: [phone]    

## 2019-07-04 LAB — CANCER ANTIGEN 19-9: CA 19-9: 42 U/mL — ABNORMAL HIGH (ref 0–35)

## 2019-07-08 ENCOUNTER — Other Ambulatory Visit: Payer: Self-pay

## 2019-07-08 ENCOUNTER — Ambulatory Visit (HOSPITAL_COMMUNITY)
Admission: RE | Admit: 2019-07-08 | Discharge: 2019-07-08 | Disposition: A | Discharge status: Home / Self Care | Payer: Medicare Other | Source: Ambulatory Visit | Attending: Oncology | Admitting: Oncology

## 2019-07-08 DIAGNOSIS — C799 Secondary malignant neoplasm of unspecified site: Secondary | ICD-10-CM | POA: Diagnosis present

## 2019-07-08 DIAGNOSIS — C169 Malignant neoplasm of stomach, unspecified: Secondary | ICD-10-CM | POA: Diagnosis present

## 2019-07-08 DIAGNOSIS — C787 Secondary malignant neoplasm of liver and intrahepatic bile duct: Secondary | ICD-10-CM | POA: Diagnosis present

## 2019-07-08 DIAGNOSIS — C61 Malignant neoplasm of prostate: Secondary | ICD-10-CM

## 2019-07-08 DIAGNOSIS — C163 Malignant neoplasm of pyloric antrum: Secondary | ICD-10-CM | POA: Insufficient documentation

## 2019-07-09 ENCOUNTER — Encounter (INDEPENDENT_AMBULATORY_CARE_PROVIDER_SITE_OTHER): Payer: Medicare Other | Admitting: Ophthalmology

## 2019-07-09 DIAGNOSIS — I10 Essential (primary) hypertension: Secondary | ICD-10-CM | POA: Diagnosis not present

## 2019-07-09 DIAGNOSIS — H35341 Macular cyst, hole, or pseudohole, right eye: Secondary | ICD-10-CM | POA: Diagnosis not present

## 2019-07-09 DIAGNOSIS — H35372 Puckering of macula, left eye: Secondary | ICD-10-CM

## 2019-07-09 DIAGNOSIS — H35033 Hypertensive retinopathy, bilateral: Secondary | ICD-10-CM | POA: Diagnosis not present

## 2019-07-09 DIAGNOSIS — H43813 Vitreous degeneration, bilateral: Secondary | ICD-10-CM

## 2019-07-16 ENCOUNTER — Encounter (HOSPITAL_COMMUNITY): Payer: Self-pay

## 2019-07-16 ENCOUNTER — Other Ambulatory Visit: Payer: Self-pay

## 2019-07-16 ENCOUNTER — Ambulatory Visit (HOSPITAL_COMMUNITY)
Admission: RE | Admit: 2019-07-16 | Discharge: 2019-07-16 | Disposition: A | Discharge status: Home / Self Care | Payer: Medicare Other | Source: Ambulatory Visit | Attending: Oncology | Admitting: Oncology

## 2019-07-16 DIAGNOSIS — C799 Secondary malignant neoplasm of unspecified site: Secondary | ICD-10-CM | POA: Diagnosis present

## 2019-07-16 DIAGNOSIS — C787 Secondary malignant neoplasm of liver and intrahepatic bile duct: Secondary | ICD-10-CM | POA: Insufficient documentation

## 2019-07-16 DIAGNOSIS — C163 Malignant neoplasm of pyloric antrum: Secondary | ICD-10-CM | POA: Insufficient documentation

## 2019-07-16 DIAGNOSIS — C169 Malignant neoplasm of stomach, unspecified: Secondary | ICD-10-CM | POA: Insufficient documentation

## 2019-07-16 DIAGNOSIS — C61 Malignant neoplasm of prostate: Secondary | ICD-10-CM | POA: Diagnosis not present

## 2019-07-16 MED ORDER — SODIUM CHLORIDE (PF) 0.9 % IJ SOLN
INTRAMUSCULAR | Status: AC
  Filled 2019-07-16: qty 50

## 2019-07-16 MED ORDER — HEPARIN SOD (PORK) LOCK FLUSH 100 UNIT/ML IV SOLN
INTRAVENOUS | Status: AC
  Filled 2019-07-16: qty 5

## 2019-07-16 MED ORDER — IOHEXOL 300 MG/ML  SOLN
100.0000 mL | Freq: Once | INTRAMUSCULAR | Status: AC | PRN
  Administered 2019-07-16: 100 mL via INTRAVENOUS

## 2019-07-21 NOTE — Progress Notes (Signed)
[place]  [phone] [phone]     ID: Peter Wood DOB: 30-Jun-1935  MR#: [id number]  XNA#:[id number]  Patient Care Team: Cassandria Anger, MD as PCP - General Milus Banister, MD as Attending Physician (Gastroenterology) Merrit Waugh, Virgie Dad, MD as Consulting Physician (Oncology) Gatha Mayer, MD as Consulting Physician (Gastroenterology) [name], MD as Consulting Physician (Ophthalmology) Ramon Dredge, MD as Referring Physician (Radiation Oncology) Larey Dresser, MD as Consulting Physician (Cardiology) OTHER MD:   CHIEF COMPLAINT: Metastatic gastric adenocarcinoma  CURRENT TREATMENT: FOLFOX, trastuzumab   INTERVAL HISTORY: Peter Wood returns today for follow-up and treatment of his metastatic gastric adenocarcinoma.   We had obtained stable disease and discontinued chemotherapy, continuing the trastuzumab alone.  Clinically he has done very well with this.  Since his last visit, he underwent repeat echocardiogram on 05/22/2019, which showed an ejection fraction of 60-65%  He also underwent chest x-ray on 07/08/2019, which showed no active cardiopulmonary disease.  Unfortunately his abdomen/pelvis CT on 07/16/2019 revealed: progressive hepatic metastatic disease with new and enlarging lesions; roughly similar band of tumor along the upper omentum just below the stomach.  We are also following his CEA and CA 19: Lab Results  Component Value Date   CAN199 42 (H) 07/01/2019   RKY706 41 (H) 06/10/2019   CAN199 50 (H) 05/20/2019   CAN199 43 (H) 04/30/2019   CAN199 50 (H) 04/09/2019   Lab Results  Component Value Date   CEA1 30.29 (H) 07/22/2019   CEA1 12.85 (H) 07/01/2019   CEA1 8.70 (H) 06/10/2019   CEA1 8.16 (H) 05/20/2019   CEA1 6.00 (H) 04/30/2019     REVIEW OF SYSTEMS: Peter Wood tells me his appetite is good he has an excellent sense of taste, no nausea or vomiting.  He has pretty good energy.  He says  "they do not let me mow the lawn", but he exercises regularly.  He and his family are making sure to stay safe.  A detailed review of systems was otherwise noncontributory     HISTORY OF CURRENT ILLNESS: From the original intake note:  Peter Wood presented to the emergency room 09/07/2018 with mid/upper abdominal pain.  He reported weight loss of 25 pounds over the past 6 months and significant anorexia.  His liver function tests were abnormal and a right upper quadrant ultrasound was obtained.  This was read as concerning for malignancy and a CT of the abdomen and pelvis with contrast was obtained the same day, showing  1. Too numerous to count hypodense masses of the liver concerning for hepatic metastasis. In the region of the porta hepatis, there are hypodense lesions more likely associated with the liver though the pancreatic head is partially obscured by a 3.2 cm hypodense mass. Findings are more likely associated with the liver and less likely an isolated pancreatic mass. 2. Upper abdominal mesenteric and peripancreatic lymphadenopathy suspicious for metastatic disease. 3. 6 mm sclerotic density in the left iliac bone adjacent to the SI joint. Given history of prostate cancer, differential considerations would include osteoblastic metastasis or possibly bone island. Showed only diverticular disease.  He was referred to GI and Dr. Ardis Hughs notes a colonoscopy performed 04/23/2017 makes the possibility of colon cancer unlikely.  Further work-up was planned, but on 09/19/2018 the patient again presented to the emergency room with chest pain, now in a different area, and a staging CT scan of the chest showed filling defects in the right lower lobe pulmonary  arteries consistent with embolism.  There was a small pericardial effusion and an additional 2.4 cm left thyroid mass.  Aortic atherosclerosis was noted  He was started on intravenous heparin and on 09/20/2018 underwent upper endoscopy under  Dr. Carlean Purl showing a giant nonbleeding cratered gastric ulcer in the posterior wall of the stomach.  Biopsies of this procedure showed (SZA 20-187) adenocarcinoma.   At that point we were consulted and we obtained tumor markers which included a CEA of 1318.2, a CA 19-9 of 67,479, a normal AFP and a normal PSA.  I discussed the case with pathology and they do not feel that additional testing would be helpful in terms of discriminating between a primary gastric and a primary biliary/pancreatic tumor.  The patient's subsequent history is as detailed below.   PAST MEDICAL HISTORY: Past Medical History:  Diagnosis Date   Arthritis    Diverticulosis of colon    Family history of breast cancer    Family history of prostate cancer    HTN (hypertension)    Hx of colonic polyp    Pneumonia    as a child/baby   Poor circulation of extremity    right leg   Prostate cancer ([place]) 2015   prostate cancer - radiation treated     PAST SURGICAL HISTORY: Past Surgical History:  Procedure Laterality Date   25 GAUGE PARS PLANA VITRECTOMY WITH 20 GAUGE MVR PORT FOR MACULAR HOLE Right 09/19/2016   Procedure: 25 GAUGE PARS PLANA VITRECTOMY WITH 20 GAUGE MVR PORT FOR MACULAR HOLE, MEMBRANE PEEL, SERUM PATCH, GAS FLUID EXCHANGE, HEADSCOPE LASER;  Surgeon: [name], MD;  Location: [place];  Service: Ophthalmology;  Laterality: Right;   BIOPSY  09/20/2018   Procedure: BIOPSY;  Surgeon: Gatha Mayer, MD;  Location: [place];  Service: Endoscopy;;   CATARACT EXTRACTION  08/27/12   Right   COLONOSCOPY     ESOPHAGOGASTRODUODENOSCOPY (EGD) WITH PROPOFOL N/A 09/20/2018   Procedure: ESOPHAGOGASTRODUODENOSCOPY (EGD) WITH PROPOFOL;  Surgeon: Gatha Mayer, MD;  Location: [place];  Service: Endoscopy;  Laterality: N/A;   HAND SURGERY  2016   right thumb   IR IMAGING GUIDED PORT INSERTION  11/04/2018   KNEE SURGERY Left 1989     FAMILY HISTORY: Family History  Problem Relation  Age of Onset   Prostate cancer Father        dx >50   Hypertension Mother    Ulcers Mother    Breast cancer Sister        dx 20's   Prostate cancer Brother        dx under 52, metasttic, cause of his death   Ulcers Brother    Ulcers Sister    Lenn's father died from heart complications with a pacemaker at age 32; Erinn's father also has prostate cancer. Patients' mother died from natural causes at age 5. The patient has 4 brothers and 3 sisters. One of [place] sisters had breast cancer in her 18s and a brother had prostate cancer. Patient denies anyone in her family having ovarian, or pancreatic cancer. Vic mentions that his youngest sister and his brother with prostate cancer also had stomach ulcers.    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 His wife, [name], is a retired [employer] summer Radiographer, therapeutic. As of 09/2018, they have been married for 56 years. They have 4 children, Claiborne Billings, [place], [place], and [place]. Claiborne Billings lives in [place] and works at the [employer]. Sherrod lives in [place] and  works at [employer] and as a part Horticulturist, commercial. 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 Beverely Low lives in [place] and works at [employer]. Shiv has 7 grandchildren and 1 step great grandchild. He attends [employer].    ADVANCED DIRECTIVES: His wife, [name], is automatically his healthcare power of attorney.     HEALTH MAINTENANCE: Social History   Tobacco Use   Smoking status: Former Smoker    Years: 4.00    Quit date: 09/12/1967    Years since quitting: 51.8   Smokeless tobacco: Never Used  Substance Use Topics   Alcohol use: No   Drug use: No    Colonoscopy: 2018/[facility]  PSA: 0.54 on 09/18/2018  No Known Allergies  Current Outpatient Medications  Medication Sig Dispense Refill   amLODipine (NORVASC) 5 MG tablet TAKE ONE TABLET BY MOUTH ONE TIME DAILY  90 tablet 2   apixaban (ELIQUIS)  2.5 MG TABS tablet Take 1 tablet (2.5 mg total) by mouth 2 (two) times daily. 60 tablet 11   benazepril (LOTENSIN) 40 MG tablet TAKE ONE TABLET BY MOUTH ONE TIME DAILY  90 tablet 2   Cholecalciferol 1000 UNITS tablet Take 1,000 Units by mouth daily.      latanoprost (XALATAN) 0.005 % ophthalmic solution Place 1 drop into both eyes at bedtime.      lidocaine-prilocaine (EMLA) cream Apply to affected area once 30 g 3   ondansetron (ZOFRAN) 4 MG tablet Take 1 tablet (4 mg total) by mouth every 8 (eight) hours as needed for nausea or vomiting. 20 tablet 1   prochlorperazine (COMPAZINE) 10 MG tablet Take 1 tablet (10 mg total) by mouth every 6 (six) hours as needed (Nausea or vomiting). 30 tablet 1   No current facility-administered medications for this visit.    Facility-Administered Medications Ordered in Other Visits  Medication Dose Route Frequency Provider Last Rate Last Dose   sodium chloride flush (NS) 0.9 % injection 10 mL  10 mL Intravenous PRN Annalucia Laino, Virgie Dad, MD         OBJECTIVE: Elderly African-American man in no acute distress  Vitals:   07/22/19 1118  BP: 140/77  Pulse: 61  Resp: 16  Temp: 98.7 F (37.1 C)  SpO2: 100%   Wt Readings from Last 3 Encounters:  07/22/19 152 lb 6.4 oz (69.1 kg)  07/01/19 150 lb 4 oz (68.2 kg)  06/12/19 145 lb (65.8 kg)   Body mass index is 25.76 kg/m.    ECOG FS:1 - Symptomatic but completely ambulatory  Sclerae unicteric, EOMs intact Wearing a mask No cervical or supraclavicular adenopathy Lungs no rales or rhonchi Heart regular rate and rhythm Abd soft, nontender, positive bowel sounds, no masses palpated MSK no focal spinal tenderness, no upper extremity lymphedema Neuro: nonfocal, well oriented, appropriate affect  LAB RESULTS:  CMP     Component Value Date/Time   NA 139 07/22/2019 1100   K 4.1 07/22/2019 1100   CL 104 07/22/2019 1100   CO2 27 07/22/2019 1100   GLUCOSE 97 07/22/2019 1100   GLUCOSE 105 (H)  09/13/2006 0900   BUN 17 07/22/2019 1100   CREATININE 1.09 07/22/2019 1100   CREATININE 1.02 10/16/2018 0936   CALCIUM 8.7 (L) 07/22/2019 1100   PROT 6.5 07/22/2019 1100   ALBUMIN 3.6 07/22/2019 1100   AST 40 07/22/2019 1100   AST 76 (H) 10/16/2018 0936   ALT 44 07/22/2019 1100   ALT 47 (H)  10/16/2018 0936   ALKPHOS 128 (H) 07/22/2019 1100   BILITOT 0.6 07/22/2019 1100   BILITOT 0.7 10/16/2018 0936   GFRNONAA >60 07/22/2019 1100   GFRNONAA >60 10/16/2018 0936   GFRAA >60 07/22/2019 1100   GFRAA >60 10/16/2018 0936    No results found for: TOTALPROTELP, ALBUMINELP, A1GS, A2GS, BETS, BETA2SER, GAMS, MSPIKE, SPEI  No results found for: KPAFRELGTCHN, LAMBDASER, KAPLAMBRATIO  Lab Results  Component Value Date   WBC 4.3 07/22/2019   NEUTROABS 2.1 07/22/2019   HGB 8.9 (L) 07/22/2019   HCT 28.9 (L) 07/22/2019   MCV 85.5 07/22/2019   PLT 198 07/22/2019    '@LASTCHEMISTRY'$ @  No results found for: LABCA2  No components found for: [id number]  No results for input(s): INR in the last 168 hours.  No results found for: LABCA2  Lab Results  Component Value Date   CAN199 42 (H) 07/01/2019    No results found for: KPQ244  No results found for: LPN300  No results found for: CA2729  No components found for: HGQUANT  Lab Results  Component Value Date   CEA1 30.29 (H) 07/22/2019   /  CEA ([facility])  Date Value Ref Range Status  07/22/2019 30.29 (H) 0.00 - 5.00 ng/mL Final    Comment:    (NOTE) This test was performed using Architect's Chemiluminescent Microparticle Immunoassay. Values obtained from different assay methods cannot be used interchangeably. Please note that 5-10% of patients who smoke may see CEA levels up to 6.9 ng/mL. Performed at [facility], [place] [address] [place], [place] [address]      No results found for: AFPTUMOR  No results found for: [place]  No results found for: PSA1  Appointment on 07/22/2019    Component Date Value Ref Range Status   WBC 07/22/2019 4.3  4.0 - 10.5 K/uL Final   RBC 07/22/2019 3.38* 4.22 - 5.81 MIL/uL Final   Hemoglobin 07/22/2019 8.9* 13.0 - 17.0 g/dL Final   HCT 07/22/2019 28.9* 39.0 - 52.0 % Final   MCV 07/22/2019 85.5  80.0 - 100.0 fL Final   MCH 07/22/2019 26.3  26.0 - 34.0 pg Final   MCHC 07/22/2019 30.8  30.0 - 36.0 g/dL Final   RDW 07/22/2019 21.2* 11.5 - 15.5 % Final   Platelets 07/22/2019 198  150 - 400 K/uL Final   nRBC 07/22/2019 0.0  0.0 - 0.2 % Final   Neutrophils Relative % 07/22/2019 50  % Final   Neutro Abs 07/22/2019 2.1  1.7 - 7.7 K/uL Final   Lymphocytes Relative 07/22/2019 29  % Final   Lymphs Abs 07/22/2019 1.2  0.7 - 4.0 K/uL Final   Monocytes Relative 07/22/2019 11  % Final   Monocytes Absolute 07/22/2019 0.5  0.1 - 1.0 K/uL Final   Eosinophils Relative 07/22/2019 9  % Final   Eosinophils Absolute 07/22/2019 0.4  0.0 - 0.5 K/uL Final   Basophils Relative 07/22/2019 1  % Final   Basophils Absolute 07/22/2019 0.0  0.0 - 0.1 K/uL Final   Immature Granulocytes 07/22/2019 0  % Final   Abs Immature Granulocytes 07/22/2019 0.01  0.00 - 0.07 K/uL Final   Performed at [facility], [place] [address], [place], [place] [address]   Sodium 07/22/2019 139  135 - 145 mmol/L Final   Potassium 07/22/2019 4.1  3.5 - 5.1 mmol/L Final   Chloride 07/22/2019 104  98 - 111 mmol/L Final   CO2 07/22/2019 27  22 - 32 mmol/L  Final   Glucose, Bld 07/22/2019 97  70 - 99 mg/dL Final   BUN 07/22/2019 17  8 - 23 mg/dL Final   Creatinine, Ser 07/22/2019 1.09  0.61 - 1.24 mg/dL Final   Calcium 07/22/2019 8.7* 8.9 - 10.3 mg/dL Final   Total Protein 07/22/2019 6.5  6.5 - 8.1 g/dL Final   Albumin 07/22/2019 3.6  3.5 - 5.0 g/dL Final   AST 07/22/2019 40  15 - 41 U/L Final   ALT 07/22/2019 44  0 - 44 U/L Final   Alkaline Phosphatase 07/22/2019 128* 38 - 126 U/L Final   Total Bilirubin 07/22/2019 0.6  0.3 - 1.2  mg/dL Final   GFR calc non Af Amer 07/22/2019 >60  >60 mL/min Final   GFR calc Af Amer 07/22/2019 >60  >60 mL/min Final   Anion gap 07/22/2019 8  5 - 15 Final   Performed at [facility], [place] [address], [place], [place] [address]   CEA ([facility]) 07/22/2019 30.29* 0.00 - 5.00 ng/mL Final   Comment: (NOTE) This test was performed using Architect's Chemiluminescent Microparticle Immunoassay. Values obtained from different assay methods cannot be used interchangeably. Please note that 5-10% of patients who smoke may see CEA levels up to 6.9 ng/mL. Performed at [facility], [place] [address], [place], [place] [address]     (this displays the last labs from the last 3 days)  No results found for: TOTALPROTELP, ALBUMINELP, A1GS, A2GS, BETS, BETA2SER, GAMS, MSPIKE, SPEI (this displays SPEP labs)  No results found for: KPAFRELGTCHN, LAMBDASER, KAPLAMBRATIO (kappa/lambda light chains)  No results found for: HGBA, HGBA2QUANT, HGBFQUANT, HGBSQUAN (Hemoglobinopathy evaluation)   No results found for: LDH  Lab Results  Component Value Date   IRON 38 (L) 09/21/2018   TIBC 251 09/21/2018   IRONPCTSAT 15 (L) 09/21/2018   (Iron and TIBC)  Lab Results  Component Value Date   FERRITIN 138 09/21/2018    Urinalysis    Component Value Date/Time   COLORURINE YELLOW 02/18/2019 1056   APPEARANCEUR CLEAR 02/18/2019 1056   LABSPEC 1.018 02/18/2019 1056   PHURINE 6.0 02/18/2019 1056   GLUCOSEU NEGATIVE 02/18/2019 1056   GLUCOSEU NEGATIVE 07/20/2017 1056   HGBUR NEGATIVE 02/18/2019 [place] 02/18/2019 [place] 02/18/2019 1056   PROTEINUR 30 (A) 02/18/2019 1056   UROBILINOGEN 0.2 07/20/2017 1056   NITRITE NEGATIVE 02/18/2019 [place] 02/18/2019 1056     STUDIES:  Dg Chest 2 View  Result Date: 07/08/2019 CLINICAL DATA:  Prostate cancer. EXAM: CHEST - 2 VIEW COMPARISON:   February 18, 2019 FINDINGS: There is a well-positioned right-sided Port-A-Cath with tip terminating in the SVC. The lungs are clear. The heart size is stable from prior study. There is no acute osseous abnormality. IMPRESSION: No active cardiopulmonary disease. Electronically Signed   By: Constance Holster M.D.   On: 07/08/2019 23:46   Ct Abdomen Pelvis W Contrast  Result Date: 07/16/2019 CLINICAL DATA:  Restaging of metastatic gastric adenocarcinoma. History prostate cancer. EXAM: CT ABDOMEN AND PELVIS WITH CONTRAST TECHNIQUE: Multidetector CT imaging of the abdomen and pelvis was performed using the standard protocol following bolus administration of intravenous contrast. CONTRAST:  113m OMNIPAQUE IOHEXOL 300 MG/ML  SOLN COMPARISON:  05/15/2019 FINDINGS: Lower chest: Stable coarse interstitial accentuation at the lung bases. Hepatobiliary: Multiple scattered hepatic metastatic lesions are present with interval enlargement of some of the lesions. One of the dominant right hepatic lobe lesions  measures 4.2 by 3.2 cm on image 9/2, formerly 2.9 by 2.7 cm. A lesion in segment 4B of the liver measures 2.1 by 1.5 cm on image 21/2 and was not previously readily apparent. Gallbladder unremarkable. Extrahepatic biliary tree unremarkable. Pancreas: Unremarkable Spleen: Unremarkable Adrenals/Urinary Tract: Small hypodense lesion of the right kidney lower pole is likely a cyst. Adrenal glands unremarkable. Stomach/Bowel: The gastric mass is probably in the gastric body for example on image 65/4. There is abnormal nodularity of the omentum just below the stomach, with a band of tumor in this vicinity measuring up to about 1.3 cm in thickness on image 64/4 (previously 1.2 cm). Prominent stool throughout the colon favors constipation. Vascular/Lymphatic: Aortoiliac atherosclerotic vascular disease. 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 Reproductive: Fiducials along the margins of  the prostate gland. Other: No supplemental non-categorized findings. Musculoskeletal: Lumbar spondylosis and degenerative disc disease causing multilevel impingement. Small umbilical hernia contains adipose tissue. IMPRESSION: 1. Progressive hepatic metastatic disease with new and enlarging lesions. Roughly similar band of tumor along the upper omentum just below the stomach. 2. Prominent stool throughout the colon favors constipation. 3. 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 Lumbar spondylosis and degenerative disc disease causing multilevel impingement. Fiducials along the margins of the prostate gland. Small umbilical hernia contains adipose tissue. Stable coarse interstitial accentuation at the lung bases. Aortic Atherosclerosis (ICD10-I70.0). Electronically Signed   By: Van Clines M.D.   On: 07/16/2019 12:38     ELIGIBLE FOR AVAILABLE RESEARCH PROTOCOL: no   ASSESSMENT: 84 y.o. [place], [place] man status post gastric biopsy 09/20/2018 showing adenocarcinoma, with a CA 19-9 greater than 65,000; staging studies showing an apparent mass adjacent to the head of the pancreas, innumerable liver lesions, upper abdominal mesenteric and peripancreatic lymphadenopathy, and an isolated sclerotic density in the left iliac bone  (a) genomics requested on gastric biopsy showed the tumor to be HER-2 amplified at 3+, PD-L1 positive, and T p53 mutated.  MSI is stable, mismatch repair status is proficient and the tumor mutational burden is intermediate.  (1) chest CT scan 09/19/2018 shows right lower lobe pulmonary embolism  (a) on intravenous heparin started 09/19/2018, transitioned to apixaban 10/01/2018  (2) genetics testing 10/04/2018: negative  (a) family history of prostate and early breast cancer; patient has a history of prostate cancer  (3) started gemcitabine/Abraxane 10/09/2018, repeated days 1, 8 and 15 of  each 28-day cycle  (a) stopped after 1 cycle (3 doses) with reassessment of diagnosis based on CARIS results, noted above  (4) started oxaliplatin, capecitabine and trastuzumab 11/06/2018  (a) baseline echocardiogram 09/20/2018 shows an ejection fraction in the 55-60% range  (b) oxaliplatin and trastuzumab every 21 days started 11/06/2018  (c) capecitabine dose 1000 mg twice daily for 14 days of every 21-day cycle  (d) CT of the abdomen and pelvis 02/11/2019 shows marked improvement in his liver lesions and resolution of periportal adenopathy  (e) tumor markers also show significant response,  (f) echocardiogram 02/12/2019 shows an ejection fraction in the 60-65% range  (g) CT abd/pelvis show stable/ improved disease, tumor markers nearly normal  (h) oxaliplatin stopped aftwer 04/30/2019 dose  (i) capecitabine stopped after September cycle   (5) trastuzumab maintenance started 05/20/2019  (6) disease progression documented by lab work and CT scan 07/16/2019  (7) to start FOLFOX 07/29/2019, continuing trastuzumab   PLAN: Izaak is now close to a year out from initial diagnosis  of metastatic gastric carcinoma, HER-2 positive.  Unfortunately we were not able to drag out to his time of chemo with HER-2 maintenance.  Even though clinically he continues to do very well, we have documented progression on his CT scan last week.  We are going to go with FOLFOX this time.  He has a good understanding of the possible toxicities, side effects and complications of the various agents and particularly concerns regarding neuropathy with cold from the oxaliplatin.  Hopefully we can start 07/29/2019.  We are going to continue the trastuzumab during treatment but changed to every 2 weeks beginning with the December 1 dose.  The plan is to proceed to 6 doses then restage.  So long as we are documenting response we will continue Stelara as it is well-tolerated.  Once we have stable disease we will try to go to  a different type of maintenance, either trastuzumab plus Pertuzumab or more likely TDM 1.  Mr. Stephens has a good understanding of this plan.  He agrees with it.  He knows the goal of treatment in his case is control.  He will call with any problems that may develop before the next visit here.    Lucrezia Dehne, Virgie Dad, MD  07/22/19 6:25 PM Medical Oncology and Hematology [facility] [place], [place] [address] Tel. [phone]    Fax. [phone]   I, Wilburn Mylar, am acting as scribe for Dr. Virgie Dad. Maimouna Rondeau.  I, Lurline Del MD, have reviewed the above documentation for accuracy and completeness, and I agree with the above.

## 2019-07-22 ENCOUNTER — Other Ambulatory Visit: Payer: Self-pay

## 2019-07-22 ENCOUNTER — Inpatient Hospital Stay: Payer: Medicare Other

## 2019-07-22 ENCOUNTER — Inpatient Hospital Stay (HOSPITAL_BASED_OUTPATIENT_CLINIC_OR_DEPARTMENT_OTHER): Payer: Medicare Other | Admitting: Oncology

## 2019-07-22 ENCOUNTER — Inpatient Hospital Stay: Payer: Medicare Other | Attending: Oncology

## 2019-07-22 VITALS — BP 140/77 | HR 61 | Temp 98.7°F | Resp 16 | Ht 64.5 in | Wt 152.4 lb

## 2019-07-22 DIAGNOSIS — Z5112 Encounter for antineoplastic immunotherapy: Secondary | ICD-10-CM | POA: Diagnosis not present

## 2019-07-22 DIAGNOSIS — C799 Secondary malignant neoplasm of unspecified site: Secondary | ICD-10-CM

## 2019-07-22 DIAGNOSIS — C169 Malignant neoplasm of stomach, unspecified: Secondary | ICD-10-CM

## 2019-07-22 DIAGNOSIS — C787 Secondary malignant neoplasm of liver and intrahepatic bile duct: Secondary | ICD-10-CM | POA: Diagnosis not present

## 2019-07-22 DIAGNOSIS — Z95828 Presence of other vascular implants and grafts: Secondary | ICD-10-CM | POA: Diagnosis not present

## 2019-07-22 DIAGNOSIS — C160 Malignant neoplasm of cardia: Secondary | ICD-10-CM

## 2019-07-22 DIAGNOSIS — Z7189 Other specified counseling: Secondary | ICD-10-CM

## 2019-07-22 DIAGNOSIS — D649 Anemia, unspecified: Secondary | ICD-10-CM

## 2019-07-22 DIAGNOSIS — C162 Malignant neoplasm of body of stomach: Secondary | ICD-10-CM | POA: Diagnosis present

## 2019-07-22 DIAGNOSIS — C168 Malignant neoplasm of overlapping sites of stomach: Secondary | ICD-10-CM

## 2019-07-22 LAB — CBC WITH DIFFERENTIAL/PLATELET
Abs Immature Granulocytes: 0.01 10*3/uL (ref 0.00–0.07)
Basophils Absolute: 0 10*3/uL (ref 0.0–0.1)
Basophils Relative: 1 %
Eosinophils Absolute: 0.4 10*3/uL (ref 0.0–0.5)
Eosinophils Relative: 9 %
HCT: 28.9 % — ABNORMAL LOW (ref 39.0–52.0)
Hemoglobin: 8.9 g/dL — ABNORMAL LOW (ref 13.0–17.0)
Immature Granulocytes: 0 %
Lymphocytes Relative: 29 %
Lymphs Abs: 1.2 10*3/uL (ref 0.7–4.0)
MCH: 26.3 pg (ref 26.0–34.0)
MCHC: 30.8 g/dL (ref 30.0–36.0)
MCV: 85.5 fL (ref 80.0–100.0)
Monocytes Absolute: 0.5 10*3/uL (ref 0.1–1.0)
Monocytes Relative: 11 %
Neutro Abs: 2.1 10*3/uL (ref 1.7–7.7)
Neutrophils Relative %: 50 %
Platelets: 198 10*3/uL (ref 150–400)
RBC: 3.38 MIL/uL — ABNORMAL LOW (ref 4.22–5.81)
RDW: 21.2 % — ABNORMAL HIGH (ref 11.5–15.5)
WBC: 4.3 10*3/uL (ref 4.0–10.5)
nRBC: 0 % (ref 0.0–0.2)

## 2019-07-22 LAB — COMPREHENSIVE METABOLIC PANEL
ALT: 44 U/L (ref 0–44)
AST: 40 U/L (ref 15–41)
Albumin: 3.6 g/dL (ref 3.5–5.0)
Alkaline Phosphatase: 128 U/L — ABNORMAL HIGH (ref 38–126)
Anion gap: 8 (ref 5–15)
BUN: 17 mg/dL (ref 8–23)
CO2: 27 mmol/L (ref 22–32)
Calcium: 8.7 mg/dL — ABNORMAL LOW (ref 8.9–10.3)
Chloride: 104 mmol/L (ref 98–111)
Creatinine, Ser: 1.09 mg/dL (ref 0.61–1.24)
GFR calc Af Amer: >60 mL/min (ref >60)
GFR calc non Af Amer: >60 mL/min (ref >60)
Glucose, Bld: 97 mg/dL (ref 70–99)
Potassium: 4.1 mmol/L (ref 3.5–5.1)
Sodium: 139 mmol/L (ref 135–145)
Total Bilirubin: 0.6 mg/dL (ref 0.3–1.2)
Total Protein: 6.5 g/dL (ref 6.5–8.1)

## 2019-07-22 LAB — CEA (IN HOUSE-CHCC): CEA (CHCC-In House): 30.29 ng/mL — ABNORMAL HIGH (ref 0.00–5.00)

## 2019-07-22 MED ORDER — DIPHENHYDRAMINE HCL 25 MG PO CAPS
25.0000 mg | ORAL_CAPSULE | Freq: Once | ORAL | Status: AC
  Administered 2019-07-22: 25 mg via ORAL

## 2019-07-22 MED ORDER — DEXAMETHASONE 4 MG PO TABS: 8.0000 mg | ORAL_TABLET | Freq: Every day | ORAL | 1 refills | Status: DC

## 2019-07-22 MED ORDER — ACETAMINOPHEN 325 MG PO TABS
ORAL_TABLET | ORAL | Status: AC
  Filled 2019-07-22: qty 2

## 2019-07-22 MED ORDER — TRASTUZUMAB-ANNS CHEMO 150 MG IV SOLR
6.0000 mg/kg | Freq: Once | INTRAVENOUS | Status: AC
  Administered 2019-07-22: 399 mg via INTRAVENOUS
  Filled 2019-07-22: qty 19

## 2019-07-22 MED ORDER — SODIUM CHLORIDE 0.9 % IV SOLN
Freq: Once | INTRAVENOUS | Status: AC
  Administered 2019-07-22: 12:00:00 via INTRAVENOUS
  Filled 2019-07-22: qty 250

## 2019-07-22 MED ORDER — HEPARIN SOD (PORK) LOCK FLUSH 100 UNIT/ML IV SOLN
500.0000 [IU] | Freq: Once | INTRAVENOUS | Status: AC | PRN
  Administered 2019-07-22: 500 [IU]
  Filled 2019-07-22: qty 5

## 2019-07-22 MED ORDER — ACETAMINOPHEN 325 MG PO TABS
650.0000 mg | ORAL_TABLET | Freq: Once | ORAL | Status: AC
  Administered 2019-07-22: 650 mg via ORAL

## 2019-07-22 MED ORDER — SODIUM CHLORIDE 0.9% FLUSH
10.0000 mL | Freq: Once | INTRAVENOUS | Status: AC
  Administered 2019-07-22: 10 mL
  Filled 2019-07-22: qty 10

## 2019-07-22 MED ORDER — PROCHLORPERAZINE MALEATE 10 MG PO TABS: 10.0000 mg | ORAL_TABLET | Freq: Four times a day (QID) | ORAL | 1 refills | Status: DC | PRN

## 2019-07-22 MED ORDER — LIDOCAINE-PRILOCAINE 2.5-2.5 % EX CREA: TOPICAL_CREAM | CUTANEOUS | 3 refills | Status: DC

## 2019-07-22 MED ORDER — DIPHENHYDRAMINE HCL 25 MG PO CAPS
ORAL_CAPSULE | ORAL | Status: AC
  Filled 2019-07-22: qty 1

## 2019-07-22 MED ORDER — SODIUM CHLORIDE 0.9% FLUSH
10.0000 mL | INTRAVENOUS | Status: DC | PRN
  Administered 2019-07-22: 10 mL
  Filled 2019-07-22: qty 10

## 2019-07-22 NOTE — Progress Notes (Signed)
DISCONTINUE OFF PATHWAY REGIMEN - Gastroesophageal   Custom Intervention:Medical: [Capecitabine, Carboplatin]:     Capecitabine      Carboplatin   **Always confirm dose/schedule in your pharmacy ordering system**  REASON: Disease Progression PRIOR TREATMENT: Off Pathway: Medical: [Capecitabine, Carboplatin] TREATMENT RESPONSE: Progressive Disease (PD)  START OFF PATHWAY REGIMEN - Gastroesophageal   [id number]:FOLFOX (q14d) **2 cycles per order sheet**:   A cycle is every 14 days:     Oxaliplatin      Leucovorin      Fluorouracil      Fluorouracil   **Always confirm dose/schedule in your pharmacy ordering system**  Patient Characteristics: Distant Metastases (cM1/pM1) / Locally Recurrent Disease, Adenocarcinoma - Esophageal, GE Junction, and Gastric, Second Line, MSS / pMMR or MSI Unknown Histology: Adenocarcinoma Disease Classification: Gastric Therapeutic Status: Distant Metastases (No Additional Staging) Line of Therapy: Second Line Microsatellite/Mismatch Repair Status: Unknown Intent of Therapy: Non-Curative / Palliative Intent, Discussed with Patient

## 2019-07-22 NOTE — Patient Instructions (Signed)
Trastuzumab injection for infusion What is this medicine? TRASTUZUMAB (tras TOO zoo mab) is a monoclonal antibody. It is used to treat breast cancer and stomach cancer. This medicine may be used for other purposes; ask your health care provider or pharmacist if you have questions. COMMON BRAND NAME(S): Herceptin, Herzuma, KANJINTI, Ogivri, Ontruzant, Trazimera What should I tell my health care provider before I take this medicine? They need to know if you have any of these conditions:  heart disease  heart failure  lung or breathing disease, like asthma  an unusual or allergic reaction to trastuzumab, benzyl alcohol, or other medications, foods, dyes, or preservatives  pregnant or trying to get pregnant  breast-feeding How should I use this medicine? This drug is given as an infusion into a vein. It is administered in a hospital or clinic by a specially trained health care professional. Talk to your pediatrician regarding the use of this medicine in children. This medicine is not approved for use in children. Overdosage: If you think you have taken too much of this medicine contact a poison control center or emergency room at once. NOTE: This medicine is only for you. Do not share this medicine with others. What if I miss a dose? It is important not to miss a dose. Call your doctor or health care professional if you are unable to keep an appointment. What may interact with this medicine? This medicine may interact with the following medications:  certain types of chemotherapy, such as daunorubicin, doxorubicin, epirubicin, and idarubicin This list may not describe all possible interactions. Give your health care provider a list of all the medicines, herbs, non-prescription drugs, or dietary supplements you use. Also tell them if you smoke, drink alcohol, or use illegal drugs. Some items may interact with your medicine. What should I watch for while using this medicine? Visit your  doctor for checks on your progress. Report any side effects. Continue your course of treatment even though you feel ill unless your doctor tells you to stop. Call your doctor or health care professional for advice if you get a fever, chills or sore throat, or other symptoms of a cold or flu. Do not treat yourself. Try to avoid being around people who are sick. You may experience fever, chills and shaking during your first infusion. These effects are usually mild and can be treated with other medicines. Report any side effects during the infusion to your health care professional. Fever and chills usually do not happen with later infusions. Do not become pregnant while taking this medicine or for 7 months after stopping it. Women should inform their doctor if they wish to become pregnant or think they might be pregnant. Women of child-bearing potential will need to have a negative pregnancy test before starting this medicine. There is a potential for serious side effects to an unborn child. Talk to your health care professional or pharmacist for more information. Do not breast-feed an infant while taking this medicine or for 7 months after stopping it. Women must use effective birth control with this medicine. What side effects may I notice from receiving this medicine? Side effects that you should report to your doctor or health care professional as soon as possible:  allergic reactions like skin rash, itching or hives, swelling of the face, lips, or tongue  chest pain or palpitations  cough  dizziness  feeling faint or lightheaded, falls  fever  general ill feeling or flu-like symptoms  signs of worsening heart failure like   breathing problems; swelling in your legs and feet  unusually weak or tired Side effects that usually do not require medical attention (report to your doctor or health care professional if they continue or are bothersome):  bone pain  changes in  taste  diarrhea  joint pain  nausea/vomiting  weight loss This list may not describe all possible side effects. Call your doctor for medical advice about side effects. You may report side effects to FDA at 1-800-FDA-1088. Where should I keep my medicine? This drug is given in a hospital or clinic and will not be stored at home. NOTE: This sheet is a summary. It may not cover all possible information. If you have questions about this medicine, talk to your doctor, pharmacist, or health care provider.  2020 [employer] (2016-08-22 14:37:52)  

## 2019-07-23 ENCOUNTER — Telehealth: Payer: Self-pay | Admitting: Oncology

## 2019-07-23 NOTE — Telephone Encounter (Signed)
I talk with patient regarding schedule  

## 2019-07-25 LAB — CANCER ANTIGEN 19-9: CA 19-9: 58 U/mL — ABNORMAL HIGH (ref 0–35)

## 2019-07-28 NOTE — Progress Notes (Signed)
[facility]  [phone] [phone]     ID: Peter Wood DOB: [birth year]  MR#: [id number]  LXB#:[id number]  Patient Care Team: Cassandria Anger, MD as PCP - General Milus Banister, MD as Attending Physician (Gastroenterology) Isobelle Tuckett, Virgie Dad, MD as Consulting Physician (Oncology) Gatha Mayer, MD as Consulting Physician (Gastroenterology) [name], MD as Consulting Physician (Ophthalmology) Ramon Dredge, MD as Referring Physician (Radiation Oncology) Larey Dresser, MD as Consulting Physician (Cardiology) OTHER MD:   CHIEF COMPLAINT: Metastatic gastric adenocarcinoma  CURRENT TREATMENT: FOLFOX, trastuzumab   INTERVAL HISTORY: Peter Wood returns today for follow-up and treatment of his metastatic gastric adenocarcinoma.   He is scheduled to start on FOLFOX today in light of progression seen on recent CT scan.  He continues on trastuzumab with good tolerance. This will be changed to every 2 weeks starting with the 08/12/2019 dose.  His most recent echocardiogram on 05/22/2019 showed an ejection fraction of 60-65%.  We are also following his CEA and CA 19: Lab Results  Component Value Date   CAN199 58 (H) 07/22/2019   CBU384 42 (H) 07/01/2019   CAN199 41 (H) 06/10/2019   CAN199 50 (H) 05/20/2019   CAN199 43 (H) 04/30/2019   Lab Results  Component Value Date   CEA1 30.29 (H) 07/22/2019   CEA1 12.85 (H) 07/01/2019   CEA1 8.70 (H) 06/10/2019   CEA1 8.16 (H) 05/20/2019   CEA1 6.00 (H) 04/30/2019     REVIEW OF SYSTEMS: Peter Wood continues to be remarkably asymptomatic.  He has a good appetite, no altered taste, no nausea or vomiting, no abdominal discomfort or other site of pain.  He is having normal bowel movements.  Overall a detailed review of systems today was benign.    HISTORY OF CURRENT ILLNESS: From the original intake note:  Peter Wood presented to the emergency room 09/07/2018 with mid/upper  abdominal pain.  He reported weight loss of 25 pounds over the past 6 months and significant anorexia.  His liver function tests were abnormal and a right upper quadrant ultrasound was obtained.  This was read as concerning for malignancy and a CT of the abdomen and pelvis with contrast was obtained the same day, showing  1. Too numerous to count hypodense masses of the liver concerning for hepatic metastasis. In the region of the porta hepatis, there are hypodense lesions more likely associated with the liver though the pancreatic head is partially obscured by a 3.2 cm hypodense mass. Findings are more likely associated with the liver and less likely an isolated pancreatic mass. 2. Upper abdominal mesenteric and peripancreatic lymphadenopathy suspicious for metastatic disease. 3. 6 mm sclerotic density in the left iliac bone adjacent to the SI joint. Given history of prostate cancer, differential considerations would include osteoblastic metastasis or possibly bone island. Showed only diverticular disease.  He was referred to GI and Dr. Ardis Hughs notes a colonoscopy performed 04/23/2017 makes the possibility of colon cancer unlikely.  Further work-up was planned, but on 09/19/2018 the patient again presented to the emergency room with chest pain, now in a different area, and a staging CT scan of the chest showed filling defects in the right lower lobe pulmonary arteries consistent with embolism.  There was a small pericardial effusion and an additional 2.4 cm left thyroid mass.  Aortic atherosclerosis was noted  He was started on intravenous heparin and on 09/20/2018 underwent upper endoscopy under Dr. Carlean Purl showing a giant nonbleeding cratered gastric ulcer  in the posterior wall of the stomach.  Biopsies of this procedure showed (SZA 20-187) adenocarcinoma.   At that point we were consulted and we obtained tumor markers which included a CEA of 1318.2, a CA 19-9 of 67,479, a normal AFP and a normal  PSA.  I discussed the case with pathology and they do not feel that additional testing would be helpful in terms of discriminating between a primary gastric and a primary biliary/pancreatic tumor.  The patient's subsequent history is as detailed below.   PAST MEDICAL HISTORY: Past Medical History:  Diagnosis Date   Arthritis    Diverticulosis of colon    Family history of breast cancer    Family history of prostate cancer    HTN (hypertension)    Hx of colonic polyp    Pneumonia    as a child/baby   Poor circulation of extremity    right leg   Prostate cancer ([facility]) 2015   prostate cancer - radiation treated     PAST SURGICAL HISTORY: Past Surgical History:  Procedure Laterality Date   25 GAUGE PARS PLANA VITRECTOMY WITH 20 GAUGE MVR PORT FOR MACULAR HOLE Right 09/19/2016   Procedure: 25 GAUGE PARS PLANA VITRECTOMY WITH 20 GAUGE MVR PORT FOR MACULAR HOLE, MEMBRANE PEEL, SERUM PATCH, GAS FLUID EXCHANGE, HEADSCOPE LASER;  Surgeon: [name], MD;  Location: [facility];  Service: Ophthalmology;  Laterality: Right;   BIOPSY  09/20/2018   Procedure: BIOPSY;  Surgeon: Gatha Mayer, MD;  Location: [facility];  Service: Endoscopy;;   CATARACT EXTRACTION  08/27/12   Right   COLONOSCOPY     ESOPHAGOGASTRODUODENOSCOPY (EGD) WITH PROPOFOL N/A 09/20/2018   Procedure: ESOPHAGOGASTRODUODENOSCOPY (EGD) WITH PROPOFOL;  Surgeon: Gatha Mayer, MD;  Location: [facility];  Service: Endoscopy;  Laterality: N/A;   HAND SURGERY  2016   right thumb   IR IMAGING GUIDED PORT INSERTION  11/04/2018   KNEE SURGERY Left 1989     FAMILY HISTORY: Family History  Problem Relation Wood of Onset   Prostate cancer Father        dx >50   Hypertension Mother    Ulcers Mother    Breast cancer Sister        dx 20's   Prostate cancer Brother        dx under 20, metasttic, cause of his death   Ulcers Brother    Ulcers Sister    Jahred's father died from heart complications  with a pacemaker at Wood 13; Danell's father also has prostate cancer. Patients' mother died from natural causes at Wood 40. The patient has 4 brothers and 3 sisters. One of [place] sisters had breast cancer in her 12s and a brother had prostate cancer. Patient denies anyone in her family having ovarian, or pancreatic cancer. Damacio mentions that his youngest sister and his brother with prostate cancer also had stomach ulcers.    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 His wife, [name], is a retired [employer] summer Radiographer, therapeutic. As of 09/2018, they have been married for 56 years. They have 4 children, Claiborne Billings, [place], [place], and [place]. Claiborne Billings lives in [place] and works at the [employer]. Sherrod lives in [place] and works at [employer] and as a part Horticulturist, commercial. 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 Beverely Low lives in [place] and works at [employer]. Francisca has 7 grandchildren and 1 step great grandchild. He attends [employer].    ADVANCED DIRECTIVES: His wife, [name], is automatically his healthcare power of attorney.     HEALTH MAINTENANCE: Social History   Tobacco Use   Smoking status: Former Smoker    Years: 4.00    Quit date: 09/12/1967    Years since quitting: 51.9   Smokeless tobacco: Never Used  Substance Use Topics   Alcohol use: No   Drug use: No    Colonoscopy: 2018/[facility]  PSA: 0.54 on 09/18/2018  No Known Allergies  Current Outpatient Medications  Medication Sig Dispense Refill   amLODipine (NORVASC) 5 MG tablet TAKE ONE TABLET BY MOUTH ONE TIME DAILY  90 tablet 2   apixaban (ELIQUIS) 2.5 MG TABS tablet Take 1 tablet (2.5 mg total) by mouth 2 (two) times daily. 60 tablet 11   benazepril (LOTENSIN) 40 MG tablet TAKE ONE TABLET BY MOUTH ONE TIME DAILY  90 tablet 2   Cholecalciferol 1000 UNITS tablet Take 1,000 Units by mouth daily.      dexamethasone (DECADRON) 4 MG tablet Take 2  tablets (8 mg total) by mouth daily. Start the day after chemotherapy for 2 days. Take with food. 30 tablet 1   latanoprost (XALATAN) 0.005 % ophthalmic solution Place 1 drop into both eyes at bedtime.      lidocaine-prilocaine (EMLA) cream Apply to affected area once 30 g 3   lidocaine-prilocaine (EMLA) cream Apply to affected area once 30 g 3   ondansetron (ZOFRAN) 4 MG tablet Take 1 tablet (4 mg total) by mouth every 8 (eight) hours as needed for nausea or vomiting. 20 tablet 1   prochlorperazine (COMPAZINE) 10 MG tablet Take 1 tablet (10 mg total) by mouth every 6 (six) hours as needed (Nausea or vomiting). 30 tablet 1   prochlorperazine (COMPAZINE) 10 MG tablet Take 1 tablet (10 mg total) by mouth every 6 (six) hours as needed (Nausea or vomiting). 30 tablet 1   No current facility-administered medications for this visit.    Facility-Administered Medications Ordered in Other Visits  Medication Dose Route Frequency Provider Last Rate Last Dose   fluorouracil (ADRUCIL) 4,250 mg in sodium chloride 0.9 % 65 mL chemo infusion  2,400 mg/m2 (Treatment Plan Recorded) Intravenous 1 day or 1 dose [name], Virgie Dad, MD       fluorouracil (ADRUCIL) chemo injection 700 mg  400 mg/m2 (Treatment Plan Recorded) Intravenous Once Jasara Corrigan, Virgie Dad, MD       fosaprepitant (EMEND) 150 mg, dexamethasone (DECADRON) 12 mg in sodium chloride 0.9 % 145 mL IVPB   Intravenous Once Iman Reinertsen, Virgie Dad, MD       heparin lock flush 100 unit/mL  500 Units Intracatheter Once PRN Eddie Payette, Virgie Dad, MD       leucovorin 708 mg in dextrose 5 % 250 mL infusion  400 mg/m2 (Treatment Plan Recorded) Intravenous Once Antigone Crowell, Virgie Dad, MD       oxaliplatin (ELOXATIN) 150 mg in dextrose 5 % 500 mL chemo infusion  85 mg/m2 (Treatment Plan Recorded) Intravenous Once Jenkins Risdon, Virgie Dad, MD       sodium chloride flush (NS) 0.9 % injection 10 mL  10 mL Intravenous PRN Lenoard Helbert, Virgie Dad, MD       sodium chloride flush  (NS) 0.9 % injection 10 mL  10 mL Intracatheter PRN Gladyce Mcray, Virgie Dad, MD         OBJECTIVE: Peter Wood  Vitals:   07/29/19 1150  BP: (!) 147/93  Pulse: 69  Resp: 16  Temp: 98.2 F (36.8 C)  SpO2: 99%   Wt Readings from Last 3 Encounters:  07/29/19 150 lb 9.6 oz (68.3 kg)  07/22/19 152 lb 6.4 oz (69.1 kg)  07/01/19 150 lb 4 oz (68.2 kg)   Body mass index is 25.45 kg/m.    ECOG FS:1 - Symptomatic but completely ambulatory  Sclerae unicteric, EOMs intact Wearing a mask No cervical or supraclavicular adenopathy Lungs no rales or rhonchi Heart regular rate and rhythm Abd soft, nontender, positive bowel sounds, no splenomegaly MSK no focal spinal tenderness, no upper extremity lymphedema Neuro: nonfocal, well oriented, appropriate affect   LAB RESULTS:  CMP     Component Value Date/Time   NA 139 07/29/2019 1144   K 4.1 07/29/2019 1144   CL 103 07/29/2019 1144   CO2 26 07/29/2019 1144   GLUCOSE 91 07/29/2019 1144   GLUCOSE 105 (H) 09/13/2006 0900   BUN 14 07/29/2019 1144   CREATININE 1.10 07/29/2019 1144   CREATININE 1.02 10/16/2018 0936   CALCIUM 8.8 (L) 07/29/2019 1144   PROT 6.6 07/29/2019 1144   ALBUMIN 3.6 07/29/2019 1144   AST 59 (H) 07/29/2019 1144   AST 76 (H) 10/16/2018 0936   ALT 66 (H) 07/29/2019 1144   ALT 47 (H) 10/16/2018 0936   ALKPHOS 156 (H) 07/29/2019 1144   BILITOT 0.7 07/29/2019 1144   BILITOT 0.7 10/16/2018 0936   GFRNONAA >60 07/29/2019 1144   GFRNONAA >60 10/16/2018 0936   GFRAA >60 07/29/2019 1144   GFRAA >60 10/16/2018 0936    No results found for: TOTALPROTELP, ALBUMINELP, A1GS, A2GS, BETS, BETA2SER, GAMS, MSPIKE, SPEI  No results found for: KPAFRELGTCHN, LAMBDASER, KAPLAMBRATIO  Lab Results  Component Value Date   WBC 4.8 07/29/2019   NEUTROABS 2.5 07/29/2019   HGB 9.3 (L) 07/29/2019   HCT 30.0 (L) 07/29/2019   MCV 85.2 07/29/2019   PLT 216 07/29/2019    No results found for:  LABCA2  No components found for: HALPFX902  No results for input(s): INR in the last 168 hours.  No results found for: LABCA2  Lab Results  Component Value Date   CAN199 58 (H) 07/22/2019    No results found for: IOX735  No results found for: HGD924  No results found for: CA2729  No components found for: HGQUANT  Lab Results  Component Value Date   CEA1 30.29 (H) 07/22/2019   /  CEA ([facility])  Date Value Ref Range Status  07/22/2019 30.29 (H) 0.00 - 5.00 ng/mL Final    Comment:    (NOTE) This test was performed using Architect's Chemiluminescent Microparticle Immunoassay. Values obtained from different assay methods cannot be used interchangeably. Please note that 5-10% of patients who smoke may see CEA levels up to 6.9 ng/mL. Performed at [facility], [place] [address] [place], [place] [address]      No results found for: AFPTUMOR  No results found for: [place]  No results found for: PSA1  Appointment on 07/29/2019  Component Date Value Ref Range Status   WBC 07/29/2019 4.8  4.0 - 10.5 K/uL Final   RBC 07/29/2019 3.52* 4.22 - 5.81 MIL/uL Final   Hemoglobin 07/29/2019 9.3* 13.0 - 17.0 g/dL Final   HCT 07/29/2019 30.0* 39.0 - 52.0 % Final   MCV 07/29/2019 85.2  80.0 - 100.0 fL Final   MCH 07/29/2019 26.4  26.0 - 34.0 pg Final   MCHC 07/29/2019 31.0  30.0 - 36.0 g/dL Final  RDW 07/29/2019 20.7* 11.5 - 15.5 % Final   Platelets 07/29/2019 216  150 - 400 K/uL Final   nRBC 07/29/2019 0.0  0.0 - 0.2 % Final   Neutrophils Relative % 07/29/2019 52  % Final   Neutro Abs 07/29/2019 2.5  1.7 - 7.7 K/uL Final   Lymphocytes Relative 07/29/2019 28  % Final   Lymphs Abs 07/29/2019 1.3  0.7 - 4.0 K/uL Final   Monocytes Relative 07/29/2019 12  % Final   Monocytes Absolute 07/29/2019 0.6  0.1 - 1.0 K/uL Final   Eosinophils Relative 07/29/2019 7  % Final   Eosinophils Absolute 07/29/2019 0.4  0.0 - 0.5 K/uL Final    Basophils Relative 07/29/2019 1  % Final   Basophils Absolute 07/29/2019 0.1  0.0 - 0.1 K/uL Final   Immature Granulocytes 07/29/2019 0  % Final   Abs Immature Granulocytes 07/29/2019 0.02  0.00 - 0.07 K/uL Final   Performed at [facility], [place] [address] [place], [place] [address]   Sodium 07/29/2019 139  135 - 145 mmol/L Final   Potassium 07/29/2019 4.1  3.5 - 5.1 mmol/L Final   Chloride 07/29/2019 103  98 - 111 mmol/L Final   CO2 07/29/2019 26  22 - 32 mmol/L Final   Glucose, Bld 07/29/2019 91  70 - 99 mg/dL Final   BUN 07/29/2019 14  8 - 23 mg/dL Final   Creatinine, Ser 07/29/2019 1.10  0.61 - 1.24 mg/dL Final   Calcium 07/29/2019 8.8* 8.9 - 10.3 mg/dL Final   Total Protein 07/29/2019 6.6  6.5 - 8.1 g/dL Final   Albumin 07/29/2019 3.6  3.5 - 5.0 g/dL Final   AST 07/29/2019 59* 15 - 41 U/L Final   ALT 07/29/2019 66* 0 - 44 U/L Final   Alkaline Phosphatase 07/29/2019 156* 38 - 126 U/L Final   Total Bilirubin 07/29/2019 0.7  0.3 - 1.2 mg/dL Final   GFR calc non Af Amer 07/29/2019 >60  >60 mL/min Final   GFR calc Af Amer 07/29/2019 >60  >60 mL/min Final   Anion gap 07/29/2019 10  5 - 15 Final   Performed at [facility], [place] [address], [place], [place] [address]    (this displays the last labs from the last 3 days)  No results found for: TOTALPROTELP, ALBUMINELP, A1GS, A2GS, BETS, BETA2SER, GAMS, MSPIKE, SPEI (this displays SPEP labs)  No results found for: KPAFRELGTCHN, LAMBDASER, KAPLAMBRATIO (kappa/lambda light chains)  No results found for: HGBA, HGBA2QUANT, HGBFQUANT, HGBSQUAN (Hemoglobinopathy evaluation)   No results found for: LDH  Lab Results  Component Value Date   IRON 38 (L) 09/21/2018   TIBC 251 09/21/2018   IRONPCTSAT 15 (L) 09/21/2018   (Iron and TIBC)  Lab Results  Component Value Date   FERRITIN 138 09/21/2018    Urinalysis    Component Value Date/Time   COLORURINE YELLOW  02/18/2019 1056   APPEARANCEUR CLEAR 02/18/2019 1056   LABSPEC 1.018 02/18/2019 1056   PHURINE 6.0 02/18/2019 1056   GLUCOSEU NEGATIVE 02/18/2019 1056   GLUCOSEU NEGATIVE 07/20/2017 1056   HGBUR NEGATIVE 02/18/2019 [place] 02/18/2019 [place] 02/18/2019 1056   PROTEINUR 30 (A) 02/18/2019 1056   UROBILINOGEN 0.2 07/20/2017 1056   NITRITE NEGATIVE 02/18/2019 [place] 02/18/2019 1056     STUDIES:  Dg Chest 2 View  Result Date: 07/08/2019 CLINICAL DATA:  Prostate cancer. EXAM: CHEST - 2 VIEW COMPARISON:  February 18, 2019 FINDINGS: There is a well-positioned right-sided Port-A-Cath with tip terminating in the SVC. The lungs are clear. The heart size is stable from prior study. There is no acute osseous abnormality. IMPRESSION: No active cardiopulmonary disease. Electronically Signed   By: Constance Holster M.D.   On: 07/08/2019 23:46   Ct Abdomen Pelvis W Contrast  Result Date: 07/16/2019 CLINICAL DATA:  Restaging of metastatic gastric adenocarcinoma. History prostate cancer. EXAM: CT ABDOMEN AND PELVIS WITH CONTRAST TECHNIQUE: Multidetector CT imaging of the abdomen and pelvis was performed using the standard protocol following bolus administration of intravenous contrast. CONTRAST:  118m OMNIPAQUE IOHEXOL 300 MG/ML  SOLN COMPARISON:  05/15/2019 FINDINGS: Lower chest: Stable coarse interstitial accentuation at the lung bases. Hepatobiliary: Multiple scattered hepatic metastatic lesions are present with interval enlargement of some of the lesions. One of the dominant right hepatic lobe lesions measures 4.2 by 3.2 cm on image 9/2, formerly 2.9 by 2.7 cm. A lesion in segment 4B of the liver measures 2.1 by 1.5 cm on image 21/2 and was not previously readily apparent. Gallbladder unremarkable. Extrahepatic biliary tree unremarkable. Pancreas: Unremarkable Spleen: Unremarkable Adrenals/Urinary Tract: Small hypodense lesion of the right kidney  lower pole is likely a cyst. Adrenal glands unremarkable. Stomach/Bowel: The gastric mass is probably in the gastric body for example on image 65/4. There is abnormal nodularity of the omentum just below the stomach, with a band of tumor in this vicinity measuring up to about 1.3 cm in thickness on image 64/4 (previously 1.2 cm). Prominent stool throughout the colon favors constipation. Vascular/Lymphatic: Aortoiliac atherosclerotic vascular disease. 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 Reproductive: Fiducials along the margins of the prostate gland. Other: No supplemental non-categorized findings. Musculoskeletal: Lumbar spondylosis and degenerative disc disease causing multilevel impingement. Small umbilical hernia contains adipose tissue. IMPRESSION: 1. Progressive hepatic metastatic disease with new and enlarging lesions. Roughly similar band of tumor along the upper omentum just below the stomach. 2. Prominent stool throughout the colon favors constipation. 3. 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 Lumbar spondylosis and degenerative disc disease causing multilevel impingement. Fiducials along the margins of the prostate gland. Small umbilical hernia contains adipose tissue. Stable coarse interstitial accentuation at the lung bases. Aortic Atherosclerosis (ICD10-I70.0). Electronically Signed   By: WVan ClinesM.D.   On: 07/16/2019 12:38     ELIGIBLE FOR AVAILABLE RESEARCH PROTOCOL: no   ASSESSMENT: 84[place]. [place] status post gastric biopsy 09/20/2018 showing adenocarcinoma, with a CA 19-9 greater than 65,000; staging studies showing an apparent mass adjacent to the head of the pancreas, innumerable liver lesions, upper abdominal mesenteric and peripancreatic lymphadenopathy, and an isolated sclerotic density in the left iliac bone  (a) genomics  requested on gastric biopsy showed the tumor to be HER-2 amplified at 3+, PD-L1 positive, and T p53 mutated.  MSI is stable, mismatch repair status is proficient and the tumor mutational burden is intermediate.  (1) chest CT scan 09/19/2018 shows right lower lobe pulmonary embolism  (a) on intravenous heparin started 09/19/2018, transitioned to apixaban 10/01/2018  (2) genetics testing 10/04/2018: negative  (a) family history of prostate and early breast cancer; patient has a history of prostate cancer  (3) started gemcitabine/Abraxane 10/09/2018, repeated days 1, 8 and 15 of each 28-day cycle  (a) stopped after 1 cycle (3 doses) with reassessment of diagnosis based on CARIS results, noted above  (4) started oxaliplatin, capecitabine  and trastuzumab 11/06/2018  (a) baseline echocardiogram 09/20/2018 shows an ejection fraction in the 55-60% range  (b) oxaliplatin and trastuzumab every 21 days started 11/06/2018  (c) capecitabine dose 1000 mg twice daily for 14 days of every 21-day cycle  (d) CT of the abdomen and pelvis 02/11/2019 shows marked improvement in his liver lesions and resolution of periportal adenopathy  (e) tumor markers also show significant response,  (f) echocardiogram 02/12/2019 shows an ejection fraction in the 60-65% range  (g) CT abd/pelvis show stable/ improved disease, tumor markers nearly normal  (h) oxaliplatin stopped aftwer 04/30/2019 dose  (i) capecitabine stopped after September cycle   (5) trastuzumab maintenance started 05/20/2019  (6) disease progression documented by lab work and CT scan 07/16/2019  (7) started FOLFOX 07/29/2019, continuing trastuzumab (every 14 days).   PLAN: Kelby is now 11-1/2 months out from initial diagnosis of what proved to be metastatic gastric cancer, HER-2 positive.  Had a very good initial response to chemotherapy, but had disease progression less than 4 months after discontinuing treatment.  Accordingly we have resumed  therapy, now with FOLFOX as well as continuing the trastuzumab.  We again reviewed side effects, complications and toxicities and particularly he understands any to avoid cold.  He will not take things out of the fridge and he will avoid cold drinks.  In fact I suggested he have hot tea frequently.  Of course he may have other side effects and I have urged him to simply call us with any problems that he thinks may be related to the treatment and that he does not exactly know how to resolve  Otherwise he will see me again in 2 weeks.  We will continue to follow his markers and after a total of 4 cycles he will be restaged.  Laquasia Pincus, Virgie Dad, MD  07/29/19 1:01 PM Medical Oncology and Hematology [facility] [place], [place] [address] Tel. [phone]    Fax. [phone]   I, Wilburn Mylar, am acting as scribe for Dr. Virgie Dad. Johnette Teigen.  I, Lurline Del MD, have reviewed the above documentation for accuracy and completeness, and I agree with the above.

## 2019-07-29 ENCOUNTER — Other Ambulatory Visit: Payer: Self-pay

## 2019-07-29 ENCOUNTER — Inpatient Hospital Stay: Payer: Medicare Other

## 2019-07-29 ENCOUNTER — Inpatient Hospital Stay (HOSPITAL_BASED_OUTPATIENT_CLINIC_OR_DEPARTMENT_OTHER): Payer: Medicare Other | Admitting: Oncology

## 2019-07-29 VITALS — BP 147/93 | HR 69 | Temp 98.2°F | Resp 16 | Ht 64.5 in | Wt 150.6 lb

## 2019-07-29 DIAGNOSIS — C787 Secondary malignant neoplasm of liver and intrahepatic bile duct: Secondary | ICD-10-CM | POA: Diagnosis not present

## 2019-07-29 DIAGNOSIS — C169 Malignant neoplasm of stomach, unspecified: Secondary | ICD-10-CM

## 2019-07-29 DIAGNOSIS — C162 Malignant neoplasm of body of stomach: Secondary | ICD-10-CM

## 2019-07-29 DIAGNOSIS — C168 Malignant neoplasm of overlapping sites of stomach: Secondary | ICD-10-CM

## 2019-07-29 DIAGNOSIS — Z5112 Encounter for antineoplastic immunotherapy: Secondary | ICD-10-CM | POA: Diagnosis not present

## 2019-07-29 DIAGNOSIS — C799 Secondary malignant neoplasm of unspecified site: Secondary | ICD-10-CM

## 2019-07-29 DIAGNOSIS — D649 Anemia, unspecified: Secondary | ICD-10-CM

## 2019-07-29 DIAGNOSIS — Z95828 Presence of other vascular implants and grafts: Secondary | ICD-10-CM

## 2019-07-29 LAB — COMPREHENSIVE METABOLIC PANEL
ALT: 66 U/L — ABNORMAL HIGH (ref 0–44)
AST: 59 U/L — ABNORMAL HIGH (ref 15–41)
Albumin: 3.6 g/dL (ref 3.5–5.0)
Alkaline Phosphatase: 156 U/L — ABNORMAL HIGH (ref 38–126)
Anion gap: 10 (ref 5–15)
BUN: 14 mg/dL (ref 8–23)
CO2: 26 mmol/L (ref 22–32)
Calcium: 8.8 mg/dL — ABNORMAL LOW (ref 8.9–10.3)
Chloride: 103 mmol/L (ref 98–111)
Creatinine, Ser: 1.1 mg/dL (ref 0.61–1.24)
GFR calc Af Amer: >60 mL/min (ref >60)
GFR calc non Af Amer: >60 mL/min (ref >60)
Glucose, Bld: 91 mg/dL (ref 70–99)
Potassium: 4.1 mmol/L (ref 3.5–5.1)
Sodium: 139 mmol/L (ref 135–145)
Total Bilirubin: 0.7 mg/dL (ref 0.3–1.2)
Total Protein: 6.6 g/dL (ref 6.5–8.1)

## 2019-07-29 LAB — CBC WITH DIFFERENTIAL/PLATELET
Abs Immature Granulocytes: 0.02 10*3/uL (ref 0.00–0.07)
Basophils Absolute: 0.1 10*3/uL (ref 0.0–0.1)
Basophils Relative: 1 %
Eosinophils Absolute: 0.4 10*3/uL (ref 0.0–0.5)
Eosinophils Relative: 7 %
HCT: 30 % — ABNORMAL LOW (ref 39.0–52.0)
Hemoglobin: 9.3 g/dL — ABNORMAL LOW (ref 13.0–17.0)
Immature Granulocytes: 0 %
Lymphocytes Relative: 28 %
Lymphs Abs: 1.3 10*3/uL (ref 0.7–4.0)
MCH: 26.4 pg (ref 26.0–34.0)
MCHC: 31 g/dL (ref 30.0–36.0)
MCV: 85.2 fL (ref 80.0–100.0)
Monocytes Absolute: 0.6 10*3/uL (ref 0.1–1.0)
Monocytes Relative: 12 %
Neutro Abs: 2.5 10*3/uL (ref 1.7–7.7)
Neutrophils Relative %: 52 %
Platelets: 216 10*3/uL (ref 150–400)
RBC: 3.52 MIL/uL — ABNORMAL LOW (ref 4.22–5.81)
RDW: 20.7 % — ABNORMAL HIGH (ref 11.5–15.5)
WBC: 4.8 10*3/uL (ref 4.0–10.5)
nRBC: 0 % (ref 0.0–0.2)

## 2019-07-29 MED ORDER — PALONOSETRON HCL INJECTION 0.25 MG/5ML
0.2500 mg | Freq: Once | INTRAVENOUS | Status: AC
  Administered 2019-07-29: 0.25 mg via INTRAVENOUS

## 2019-07-29 MED ORDER — DEXTROSE 5 % IV SOLN
Freq: Once | INTRAVENOUS | Status: AC
  Administered 2019-07-29: 13:00:00 via INTRAVENOUS
  Filled 2019-07-29: qty 250

## 2019-07-29 MED ORDER — FLUOROURACIL CHEMO INJECTION 2.5 GM/50ML
400.0000 mg/m2 | Freq: Once | INTRAVENOUS | Status: AC
  Administered 2019-07-29: 18:00:00 700 mg via INTRAVENOUS
  Filled 2019-07-29: qty 14

## 2019-07-29 MED ORDER — SODIUM CHLORIDE 0.9% FLUSH
10.0000 mL | INTRAVENOUS | Status: DC | PRN
  Filled 2019-07-29: qty 10

## 2019-07-29 MED ORDER — HEPARIN SOD (PORK) LOCK FLUSH 100 UNIT/ML IV SOLN
500.0000 [IU] | Freq: Once | INTRAVENOUS | Status: DC | PRN
  Filled 2019-07-29: qty 5

## 2019-07-29 MED ORDER — PALONOSETRON HCL INJECTION 0.25 MG/5ML
INTRAVENOUS | Status: AC
  Filled 2019-07-29: qty 5

## 2019-07-29 MED ORDER — SODIUM CHLORIDE 0.9% FLUSH
10.0000 mL | Freq: Once | INTRAVENOUS | Status: AC
  Administered 2019-07-29: 10 mL
  Filled 2019-07-29: qty 10

## 2019-07-29 MED ORDER — SODIUM CHLORIDE 0.9 % IV SOLN: 10.0000 mg | Freq: Once | INTRAVENOUS | Status: DC

## 2019-07-29 MED ORDER — DEXAMETHASONE SODIUM PHOSPHATE 10 MG/ML IJ SOLN
INTRAMUSCULAR | Status: AC
  Filled 2019-07-29: qty 1

## 2019-07-29 MED ORDER — SODIUM CHLORIDE 0.9 % IV SOLN
2400.0000 mg/m2 | INTRAVENOUS | Status: DC
  Administered 2019-07-29: 4250 mg via INTRAVENOUS
  Filled 2019-07-29: qty 85

## 2019-07-29 MED ORDER — SODIUM CHLORIDE 0.9 % IV SOLN
Freq: Once | INTRAVENOUS | Status: AC
  Administered 2019-07-29: 13:00:00 via INTRAVENOUS
  Filled 2019-07-29: qty 5

## 2019-07-29 MED ORDER — OXALIPLATIN CHEMO INJECTION 100 MG/20ML
85.0000 mg/m2 | Freq: Once | INTRAVENOUS | Status: AC
  Administered 2019-07-29: 150 mg via INTRAVENOUS
  Filled 2019-07-29: qty 20

## 2019-07-29 MED ORDER — LEUCOVORIN CALCIUM INJECTION 350 MG
400.0000 mg/m2 | Freq: Once | INTRAVENOUS | Status: AC
  Administered 2019-07-29: 708 mg via INTRAVENOUS
  Filled 2019-07-29: qty 35.4

## 2019-07-29 NOTE — Progress Notes (Signed)
Patient required Peter Wood with his previous CapeOx treatment, in addition to dexamethasone/palenosetron. Per Dr. Jana Hakim, add Peter Wood to FOLFOX treatment plan.   Demetrius Charity, PharmD, [place] [facility] Pharmacist Pharmacy Phone: [phone] 07/29/2019

## 2019-07-29 NOTE — Patient Instructions (Signed)
Leucovorin injection What is this medicine? LEUCOVORIN (loo koe VOR in) is used to prevent or treat the harmful effects of some medicines. This medicine is used to treat anemia caused by a low amount of folic acid in the body. It is also used with 5-fluorouracil (5-FU) to treat colon cancer. This medicine may be used for other purposes; ask your health care provider or pharmacist if you have questions. What should I tell my health care provider before I take this medicine? They need to know if you have any of these conditions:  anemia from low levels of vitamin B-12 in the blood  an unusual or allergic reaction to leucovorin, folic acid, other medicines, foods, dyes, or preservatives  pregnant or trying to get pregnant  breast-feeding How should I use this medicine? This medicine is for injection into a muscle or into a vein. It is given by a health care professional in a hospital or clinic setting. Talk to your pediatrician regarding the use of this medicine in children. Special care may be needed. Overdosage: If you think you have taken too much of this medicine contact a poison control center or emergency room at once. NOTE: This medicine is only for you. Do not share this medicine with others. What if I miss a dose? This does not apply. What may interact with this medicine?  capecitabine  fluorouracil  phenobarbital  phenytoin  primidone  trimethoprim-sulfamethoxazole This list may not describe all possible interactions. Give your health care provider a list of all the medicines, herbs, non-prescription drugs, or dietary supplements you use. Also tell them if you smoke, drink alcohol, or use illegal drugs. Some items may interact with your medicine. What should I watch for while using this medicine? Your condition will be monitored carefully while you are receiving this medicine. This medicine may increase the side effects of 5-fluorouracil, 5-FU. Tell your doctor or health  care professional if you have diarrhea or mouth sores that do not get better or that get worse. What side effects may I notice from receiving this medicine? Side effects that you should report to your doctor or health care professional as soon as possible:  allergic reactions like skin rash, itching or hives, swelling of the face, lips, or tongue  breathing problems  fever, infection  mouth sores  unusual bleeding or bruising  unusually weak or tired Side effects that usually do not require medical attention (report to your doctor or health care professional if they continue or are bothersome):  constipation or diarrhea  loss of appetite  nausea, vomiting This list may not describe all possible side effects. Call your doctor for medical advice about side effects. You may report side effects to FDA at 1-800-FDA-1088. Where should I keep my medicine? This drug is given in a hospital or clinic and will not be stored at home. NOTE: This sheet is a summary. It may not cover all possible information. If you have questions about this medicine, talk to your doctor, pharmacist, or health care provider.  2020 [employer] Standard (2008-03-03 16:50:29) Fluorouracil, 5-FU injection What is this medicine? FLUOROURACIL, 5-FU (flure oh YOOR a sil) is a chemotherapy drug. It slows the growth of cancer cells. This medicine is used to treat many types of cancer like breast cancer, colon or rectal cancer, pancreatic cancer, and stomach cancer. This medicine may be used for other purposes; ask your health care provider or pharmacist if you have questions. COMMON BRAND NAME(S): Adrucil What should I tell my   health care provider before I take this medicine? They need to know if you have any of these conditions:  blood disorders  dihydropyrimidine dehydrogenase (DPD) deficiency  infection (especially a virus infection such as chickenpox, cold sores, or herpes)  kidney disease  liver  disease  malnourished, poor nutrition  recent or ongoing radiation therapy  an unusual or allergic reaction to fluorouracil, other chemotherapy, other medicines, foods, dyes, or preservatives  pregnant or trying to get pregnant  breast-feeding How should I use this medicine? This drug is given as an infusion or injection into a vein. It is administered in a hospital or clinic by a specially trained health care professional. Talk to your pediatrician regarding the use of this medicine in children. Special care may be needed. Overdosage: If you think you have taken too much of this medicine contact a poison control center or emergency room at once. NOTE: This medicine is only for you. Do not share this medicine with others. What if I miss a dose? It is important not to miss your dose. Call your doctor or health care professional if you are unable to keep an appointment. What may interact with this medicine?  allopurinol  cimetidine  dapsone  digoxin  hydroxyurea  leucovorin  levamisole  medicines for seizures like ethotoin, fosphenytoin, phenytoin  medicines to increase blood counts like filgrastim, pegfilgrastim, sargramostim  medicines that treat or prevent blood clots like warfarin, enoxaparin, and dalteparin  methotrexate  metronidazole  pyrimethamine  some other chemotherapy drugs like busulfan, cisplatin, estramustine, vinblastine  trimethoprim  trimetrexate  vaccines Talk to your doctor or health care professional before taking any of these medicines:  acetaminophen  aspirin  ibuprofen  ketoprofen  naproxen This list may not describe all possible interactions. Give your health care provider a list of all the medicines, herbs, non-prescription drugs, or dietary supplements you use. Also tell them if you smoke, drink alcohol, or use illegal drugs. Some items may interact with your medicine. What should I watch for while using this medicine? Visit  your doctor for checks on your progress. This drug may make you feel generally unwell. This is not uncommon, as chemotherapy can affect healthy cells as well as cancer cells. Report any side effects. Continue your course of treatment even though you feel ill unless your doctor tells you to stop. In some cases, you may be given additional medicines to help with side effects. Follow all directions for their use. Call your doctor or health care professional for advice if you get a fever, chills or sore throat, or other symptoms of a cold or flu. Do not treat yourself. This drug decreases your body's ability to fight infections. Try to avoid being around people who are sick. This medicine may increase your risk to bruise or bleed. Call your doctor or health care professional if you notice any unusual bleeding. Be careful brushing and flossing your teeth or using a toothpick because you may get an infection or bleed more easily. If you have any dental work done, tell your dentist you are receiving this medicine. Avoid taking products that contain aspirin, acetaminophen, ibuprofen, naproxen, or ketoprofen unless instructed by your doctor. These medicines may hide a fever. Do not become pregnant while taking this medicine. Women should inform their doctor if they wish to become pregnant or think they might be pregnant. There is a potential for serious side effects to an unborn child. Talk to your health care professional or pharmacist for more information. Do   not breast-feed an infant while taking this medicine. Men should inform their doctor if they wish to father a child. This medicine may lower sperm counts. Do not treat diarrhea with over the counter products. Contact your doctor if you have diarrhea that lasts more than 2 days or if it is severe and watery. This medicine can make you more sensitive to the sun. Keep out of the sun. If you cannot avoid being in the sun, wear protective clothing and use  sunscreen. Do not use sun lamps or tanning beds/booths. What side effects may I notice from receiving this medicine? Side effects that you should report to your doctor or health care professional as soon as possible:  allergic reactions like skin rash, itching or hives, swelling of the face, lips, or tongue  low blood counts - this medicine may decrease the number of white blood cells, red blood cells and platelets. You may be at increased risk for infections and bleeding.  signs of infection - fever or chills, cough, sore throat, pain or difficulty passing urine  signs of decreased platelets or bleeding - bruising, pinpoint red spots on the skin, black, tarry stools, blood in the urine  signs of decreased red blood cells - unusually weak or tired, fainting spells, lightheadedness  breathing problems  changes in vision  chest pain  mouth sores  nausea and vomiting  pain, swelling, redness at site where injected  pain, tingling, numbness in the hands or feet  redness, swelling, or sores on hands or feet  stomach pain  unusual bleeding Side effects that usually do not require medical attention (report to your doctor or health care professional if they continue or are bothersome):  changes in finger or toe nails  diarrhea  dry or itchy skin  hair loss  headache  loss of appetite  sensitivity of eyes to the light  stomach upset  unusually teary eyes This list may not describe all possible side effects. Call your doctor for medical advice about side effects. You may report side effects to FDA at 1-800-FDA-1088. Where should I keep my medicine? This drug is given in a hospital or clinic and will not be stored at home. NOTE: This sheet is a summary. It may not cover all possible information. If you have questions about this medicine, talk to your doctor, pharmacist, or health care provider.  2020 [employer] Standard (2008-01-01 13:53:16)  

## 2019-07-31 ENCOUNTER — Other Ambulatory Visit: Payer: Self-pay

## 2019-07-31 ENCOUNTER — Inpatient Hospital Stay: Payer: Medicare Other

## 2019-07-31 VITALS — BP 142/72 | HR 68 | Temp 98.2°F | Resp 16

## 2019-07-31 DIAGNOSIS — C787 Secondary malignant neoplasm of liver and intrahepatic bile duct: Secondary | ICD-10-CM

## 2019-07-31 DIAGNOSIS — Z5112 Encounter for antineoplastic immunotherapy: Secondary | ICD-10-CM | POA: Diagnosis not present

## 2019-07-31 DIAGNOSIS — C168 Malignant neoplasm of overlapping sites of stomach: Secondary | ICD-10-CM

## 2019-07-31 DIAGNOSIS — C169 Malignant neoplasm of stomach, unspecified: Secondary | ICD-10-CM

## 2019-07-31 DIAGNOSIS — C799 Secondary malignant neoplasm of unspecified site: Secondary | ICD-10-CM

## 2019-07-31 DIAGNOSIS — Z95828 Presence of other vascular implants and grafts: Secondary | ICD-10-CM

## 2019-07-31 MED ORDER — HEPARIN SOD (PORK) LOCK FLUSH 100 UNIT/ML IV SOLN
500.0000 [IU] | Freq: Once | INTRAVENOUS | Status: AC | PRN
  Administered 2019-07-31: 15:00:00 500 [IU]
  Filled 2019-07-31: qty 5

## 2019-07-31 MED ORDER — SODIUM CHLORIDE 0.9% FLUSH
10.0000 mL | INTRAVENOUS | Status: DC | PRN
  Administered 2019-07-31: 10 mL
  Filled 2019-07-31: qty 10

## 2019-08-01 ENCOUNTER — Other Ambulatory Visit: Payer: Self-pay | Admitting: *Deleted

## 2019-08-01 DIAGNOSIS — Z95828 Presence of other vascular implants and grafts: Secondary | ICD-10-CM

## 2019-08-01 DIAGNOSIS — C787 Secondary malignant neoplasm of liver and intrahepatic bile duct: Secondary | ICD-10-CM

## 2019-08-01 DIAGNOSIS — C799 Secondary malignant neoplasm of unspecified site: Secondary | ICD-10-CM

## 2019-08-01 DIAGNOSIS — C169 Malignant neoplasm of stomach, unspecified: Secondary | ICD-10-CM

## 2019-08-01 DIAGNOSIS — C168 Malignant neoplasm of overlapping sites of stomach: Secondary | ICD-10-CM

## 2019-08-01 MED ORDER — LIDOCAINE-PRILOCAINE 2.5-2.5 % EX CREA: TOPICAL_CREAM | CUTANEOUS | 3 refills | Status: DC

## 2019-08-01 MED ORDER — DEXAMETHASONE 4 MG PO TABS: 8.0000 mg | ORAL_TABLET | Freq: Every day | ORAL | 1 refills | Status: DC

## 2019-08-12 ENCOUNTER — Other Ambulatory Visit: Payer: Self-pay | Admitting: Adult Health

## 2019-08-12 ENCOUNTER — Inpatient Hospital Stay: Payer: Medicare Other | Attending: Oncology

## 2019-08-12 ENCOUNTER — Inpatient Hospital Stay: Payer: Medicare Other

## 2019-08-12 ENCOUNTER — Other Ambulatory Visit: Payer: Self-pay

## 2019-08-12 ENCOUNTER — Inpatient Hospital Stay (HOSPITAL_BASED_OUTPATIENT_CLINIC_OR_DEPARTMENT_OTHER): Payer: Medicare Other | Admitting: Adult Health

## 2019-08-12 ENCOUNTER — Encounter: Payer: Self-pay | Admitting: Adult Health

## 2019-08-12 ENCOUNTER — Ambulatory Visit: Payer: Medicare Other

## 2019-08-12 VITALS — BP 140/87 | HR 74 | Temp 98.9°F | Resp 17 | Ht 64.5 in | Wt 150.2 lb

## 2019-08-12 DIAGNOSIS — Z86711 Personal history of pulmonary embolism: Secondary | ICD-10-CM | POA: Insufficient documentation

## 2019-08-12 DIAGNOSIS — Z5112 Encounter for antineoplastic immunotherapy: Secondary | ICD-10-CM | POA: Insufficient documentation

## 2019-08-12 DIAGNOSIS — Z8042 Family history of malignant neoplasm of prostate: Secondary | ICD-10-CM | POA: Diagnosis not present

## 2019-08-12 DIAGNOSIS — C168 Malignant neoplasm of overlapping sites of stomach: Secondary | ICD-10-CM

## 2019-08-12 DIAGNOSIS — Z5111 Encounter for antineoplastic chemotherapy: Secondary | ICD-10-CM | POA: Insufficient documentation

## 2019-08-12 DIAGNOSIS — C169 Malignant neoplasm of stomach, unspecified: Secondary | ICD-10-CM

## 2019-08-12 DIAGNOSIS — C787 Secondary malignant neoplasm of liver and intrahepatic bile duct: Secondary | ICD-10-CM

## 2019-08-12 DIAGNOSIS — Z79899 Other long term (current) drug therapy: Secondary | ICD-10-CM | POA: Insufficient documentation

## 2019-08-12 DIAGNOSIS — C162 Malignant neoplasm of body of stomach: Secondary | ICD-10-CM | POA: Insufficient documentation

## 2019-08-12 DIAGNOSIS — Z87891 Personal history of nicotine dependence: Secondary | ICD-10-CM | POA: Diagnosis not present

## 2019-08-12 DIAGNOSIS — C799 Secondary malignant neoplasm of unspecified site: Secondary | ICD-10-CM | POA: Diagnosis not present

## 2019-08-12 DIAGNOSIS — Z803 Family history of malignant neoplasm of breast: Secondary | ICD-10-CM | POA: Insufficient documentation

## 2019-08-12 DIAGNOSIS — D649 Anemia, unspecified: Secondary | ICD-10-CM

## 2019-08-12 DIAGNOSIS — Z95828 Presence of other vascular implants and grafts: Secondary | ICD-10-CM

## 2019-08-12 DIAGNOSIS — Z7901 Long term (current) use of anticoagulants: Secondary | ICD-10-CM | POA: Diagnosis not present

## 2019-08-12 LAB — CBC WITH DIFFERENTIAL/PLATELET
Abs Immature Granulocytes: 0.01 10*3/uL (ref 0.00–0.07)
Basophils Absolute: 0 10*3/uL (ref 0.0–0.1)
Basophils Relative: 1 %
Eosinophils Absolute: 0.1 10*3/uL (ref 0.0–0.5)
Eosinophils Relative: 4 %
HCT: 29.3 % — ABNORMAL LOW (ref 39.0–52.0)
Hemoglobin: 9.1 g/dL — ABNORMAL LOW (ref 13.0–17.0)
Immature Granulocytes: 0 %
Lymphocytes Relative: 30 %
Lymphs Abs: 1.1 10*3/uL (ref 0.7–4.0)
MCH: 25.7 pg — ABNORMAL LOW (ref 26.0–34.0)
MCHC: 31.1 g/dL (ref 30.0–36.0)
MCV: 82.8 fL (ref 80.0–100.0)
Monocytes Absolute: 0.5 10*3/uL (ref 0.1–1.0)
Monocytes Relative: 13 %
Neutro Abs: 1.8 10*3/uL (ref 1.7–7.7)
Neutrophils Relative %: 52 %
Platelets: 180 10*3/uL (ref 150–400)
RBC: 3.54 MIL/uL — ABNORMAL LOW (ref 4.22–5.81)
RDW: 20.4 % — ABNORMAL HIGH (ref 11.5–15.5)
WBC: 3.5 10*3/uL — ABNORMAL LOW (ref 4.0–10.5)
nRBC: 0 % (ref 0.0–0.2)

## 2019-08-12 LAB — COMPREHENSIVE METABOLIC PANEL
ALT: 41 U/L (ref 0–44)
AST: 38 U/L (ref 15–41)
Albumin: 3.5 g/dL (ref 3.5–5.0)
Alkaline Phosphatase: 140 U/L — ABNORMAL HIGH (ref 38–126)
Anion gap: 9 (ref 5–15)
BUN: 16 mg/dL (ref 8–23)
CO2: 26 mmol/L (ref 22–32)
Calcium: 8.6 mg/dL — ABNORMAL LOW (ref 8.9–10.3)
Chloride: 104 mmol/L (ref 98–111)
Creatinine, Ser: 1.18 mg/dL (ref 0.61–1.24)
GFR calc Af Amer: >60 mL/min (ref >60)
GFR calc non Af Amer: 57 mL/min — ABNORMAL LOW (ref >60)
Glucose, Bld: 100 mg/dL — ABNORMAL HIGH (ref 70–99)
Potassium: 3.9 mmol/L (ref 3.5–5.1)
Sodium: 139 mmol/L (ref 135–145)
Total Bilirubin: 0.6 mg/dL (ref 0.3–1.2)
Total Protein: 6.3 g/dL — ABNORMAL LOW (ref 6.5–8.1)

## 2019-08-12 LAB — CEA (IN HOUSE-CHCC): CEA (CHCC-In House): 73.86 ng/mL — ABNORMAL HIGH (ref 0.00–5.00)

## 2019-08-12 MED ORDER — FLUOROURACIL CHEMO INJECTION 2.5 GM/50ML
400.0000 mg/m2 | Freq: Once | INTRAVENOUS | Status: AC
  Administered 2019-08-12: 17:00:00 700 mg via INTRAVENOUS
  Filled 2019-08-12: qty 14

## 2019-08-12 MED ORDER — SODIUM CHLORIDE 0.9 % IV SOLN
Freq: Once | INTRAVENOUS | Status: AC
  Administered 2019-08-12: 14:00:00 via INTRAVENOUS
  Filled 2019-08-12: qty 5

## 2019-08-12 MED ORDER — SODIUM CHLORIDE 0.9% FLUSH
10.0000 mL | Freq: Once | INTRAVENOUS | Status: AC
  Administered 2019-08-12: 10 mL
  Filled 2019-08-12: qty 10

## 2019-08-12 MED ORDER — ACETAMINOPHEN 325 MG PO TABS
650.0000 mg | ORAL_TABLET | Freq: Once | ORAL | Status: AC
  Administered 2019-08-12: 14:00:00 650 mg via ORAL

## 2019-08-12 MED ORDER — DIPHENHYDRAMINE HCL 25 MG PO CAPS
ORAL_CAPSULE | ORAL | Status: AC
  Filled 2019-08-12: qty 1

## 2019-08-12 MED ORDER — DEXTROSE 5 % IV SOLN
Freq: Once | INTRAVENOUS | Status: AC
  Administered 2019-08-12: 14:00:00 via INTRAVENOUS
  Filled 2019-08-12: qty 250

## 2019-08-12 MED ORDER — DIPHENHYDRAMINE HCL 25 MG PO CAPS
25.0000 mg | ORAL_CAPSULE | Freq: Once | ORAL | Status: AC
  Administered 2019-08-12: 25 mg via ORAL

## 2019-08-12 MED ORDER — ACETAMINOPHEN 325 MG PO TABS
ORAL_TABLET | ORAL | Status: AC
  Filled 2019-08-12: qty 2

## 2019-08-12 MED ORDER — LEUCOVORIN CALCIUM INJECTION 350 MG
400.0000 mg/m2 | Freq: Once | INTRAVENOUS | Status: AC
  Administered 2019-08-12: 708 mg via INTRAVENOUS
  Filled 2019-08-12: qty 35.4

## 2019-08-12 MED ORDER — SODIUM CHLORIDE 0.9% FLUSH
10.0000 mL | INTRAVENOUS | Status: DC | PRN
  Filled 2019-08-12: qty 10

## 2019-08-12 MED ORDER — TRASTUZUMAB-ANNS CHEMO 150 MG IV SOLR
4.0000 mg/kg | Freq: Once | INTRAVENOUS | Status: AC
  Administered 2019-08-12: 14:00:00 252 mg via INTRAVENOUS
  Filled 2019-08-12: qty 12

## 2019-08-12 MED ORDER — SODIUM CHLORIDE 0.9 % IV SOLN
Freq: Once | INTRAVENOUS | Status: AC
  Administered 2019-08-12: 14:00:00 via INTRAVENOUS
  Filled 2019-08-12: qty 250

## 2019-08-12 MED ORDER — OXALIPLATIN CHEMO INJECTION 100 MG/20ML
85.0000 mg/m2 | Freq: Once | INTRAVENOUS | Status: AC
  Administered 2019-08-12: 150 mg via INTRAVENOUS
  Filled 2019-08-12: qty 10

## 2019-08-12 MED ORDER — PALONOSETRON HCL INJECTION 0.25 MG/5ML
INTRAVENOUS | Status: AC
  Filled 2019-08-12: qty 5

## 2019-08-12 MED ORDER — PALONOSETRON HCL INJECTION 0.25 MG/5ML
0.2500 mg | Freq: Once | INTRAVENOUS | Status: AC
  Administered 2019-08-12: 0.25 mg via INTRAVENOUS

## 2019-08-12 MED ORDER — SODIUM CHLORIDE 0.9 % IV SOLN
2400.0000 mg/m2 | INTRAVENOUS | Status: DC
  Administered 2019-08-12: 4250 mg via INTRAVENOUS
  Filled 2019-08-12: qty 85

## 2019-08-12 NOTE — Progress Notes (Signed)
[facility]  [phone] [phone]     ID: Peter Wood DOB: 1935-05-26  MR#: [id number]  DCV#:[id number]  Patient Care Team: Cassandria Anger, MD as PCP - General Milus Banister, MD as Attending Physician (Gastroenterology) Magrinat, Virgie Dad, MD as Consulting Physician (Oncology) Gatha Mayer, MD as Consulting Physician (Gastroenterology) [name], MD as Consulting Physician (Ophthalmology) Ramon Dredge, MD as Referring Physician (Radiation Oncology) Larey Dresser, MD as Consulting Physician (Cardiology) OTHER MD:   CHIEF COMPLAINT: Metastatic gastric adenocarcinoma  CURRENT TREATMENT: FOLFOX, trastuzumab   INTERVAL HISTORY: Peter Wood returns today for follow-up and treatment of his metastatic gastric adenocarcinoma.   He was restarted on FOLFOX due to progression that was noted on CT scan earlier last month.  He started treatment on 07/29/2019 to be given on day 1 of a 14 day cycle.  Today is cycle 2 day 1 of treatment.    He continues on trastuzumab with good tolerance. He receives this every 2 weeks starting today.   His most recent echocardiogram on 05/22/2019 showed an ejection fraction of 60-65%.   REVIEW OF SYSTEMS: Peter Wood is doing well today.  He says that he is tolerating his treatment well.  He has no nasuea, or vomiting and is taking his antiemetics as prescribed.  He denies fatigue.  He is following appropriate pandemic precautions.  He denies fever, chills, chest pain, palpitations, cough, shortness of breath, bowel/bladder changes, neuropathy, or any other concerns.  A detailed ROS was otherwise non contributory.     HISTORY OF CURRENT ILLNESS: From the original intake note:  Peter Wood presented to the emergency room 09/07/2018 with mid/upper abdominal pain.  He reported weight loss of 25 pounds over the past 6 months and significant anorexia.  His liver function tests were abnormal and a  right upper quadrant ultrasound was obtained.  This was read as concerning for malignancy and a CT of the abdomen and pelvis with contrast was obtained the same day, showing  1. Too numerous to count hypodense masses of the liver concerning for hepatic metastasis. In the region of the porta hepatis, there are hypodense lesions more likely associated with the liver though the pancreatic head is partially obscured by a 3.2 cm hypodense mass. Findings are more likely associated with the liver and less likely an isolated pancreatic mass. 2. Upper abdominal mesenteric and peripancreatic lymphadenopathy suspicious for metastatic disease. 3. 6 mm sclerotic density in the left iliac bone adjacent to the SI joint. Given history of prostate cancer, differential considerations would include osteoblastic metastasis or possibly bone island. Showed only diverticular disease.  He was referred to GI and Dr. Ardis Hughs notes a colonoscopy performed 04/23/2017 makes the possibility of colon cancer unlikely.  Further work-up was planned, but on 09/19/2018 the patient again presented to the emergency room with chest pain, now in a different area, and a staging CT scan of the chest showed filling defects in the right lower lobe pulmonary arteries consistent with embolism.  There was a small pericardial effusion and an additional 2.4 cm left thyroid mass.  Aortic atherosclerosis was noted  He was started on intravenous heparin and on 09/20/2018 underwent upper endoscopy under Dr. Carlean Purl showing a giant nonbleeding cratered gastric ulcer in the posterior wall of the stomach.  Biopsies of this procedure showed (SZA 20-187) adenocarcinoma.   At that point we were consulted and we obtained tumor markers which included a CEA of 1318.2, a CA 19-9  of 67,479, a normal AFP and a normal PSA.  I discussed the case with pathology and they do not feel that additional testing would be helpful in terms of discriminating between a primary  gastric and a primary biliary/pancreatic tumor.  The patient's subsequent history is as detailed below.   PAST MEDICAL HISTORY: Past Medical History:  Diagnosis Date   Arthritis    Diverticulosis of colon    Family history of breast cancer    Family history of prostate cancer    HTN (hypertension)    Hx of colonic polyp    Pneumonia    as a child/baby   Poor circulation of extremity    right leg   Prostate cancer ([place]) 2015   prostate cancer - radiation treated     PAST SURGICAL HISTORY: Past Surgical History:  Procedure Laterality Date   25 GAUGE PARS PLANA VITRECTOMY WITH 20 GAUGE MVR PORT FOR MACULAR HOLE Right 09/19/2016   Procedure: 25 GAUGE PARS PLANA VITRECTOMY WITH 20 GAUGE MVR PORT FOR MACULAR HOLE, MEMBRANE PEEL, SERUM PATCH, GAS FLUID EXCHANGE, HEADSCOPE LASER;  Surgeon: [name], MD;  Location: [facility];  Service: Ophthalmology;  Laterality: Right;   BIOPSY  09/20/2018   Procedure: BIOPSY;  Surgeon: Gatha Mayer, MD;  Location: [facility];  Service: Endoscopy;;   CATARACT EXTRACTION  08/27/12   Right   COLONOSCOPY     ESOPHAGOGASTRODUODENOSCOPY (EGD) WITH PROPOFOL N/A 09/20/2018   Procedure: ESOPHAGOGASTRODUODENOSCOPY (EGD) WITH PROPOFOL;  Surgeon: Gatha Mayer, MD;  Location: [facility];  Service: Endoscopy;  Laterality: N/A;   HAND SURGERY  2016   right thumb   IR IMAGING GUIDED PORT INSERTION  11/04/2018   KNEE SURGERY Left 1989     FAMILY HISTORY: Family History  Problem Relation Age of Onset   Prostate cancer Father        dx >50   Hypertension Mother    Ulcers Mother    Breast cancer Sister        dx 20's   Prostate cancer Brother        dx under 41, metasttic, cause of his death   Ulcers Brother    Ulcers Sister    Sher's father died from heart complications with a pacemaker at age 22; Zakariya's father also has prostate cancer. Patients' mother died from natural causes at age 51. The patient has 4 brothers  and 3 sisters. One of Waldron sisters had breast cancer in her 39s and a brother had prostate cancer. Patient denies anyone in her family having ovarian, or pancreatic cancer. Jakai mentions that his youngest sister and his brother with prostate cancer also had stomach ulcers.    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 His wife, [name], is a retired [employer] summer Radiographer, therapeutic. As of 09/2018, they have been married for 56 years. They have 4 children, Claiborne Billings, Leando, Omao, and [place]. Claiborne Billings lives in [place] and works at the [employer]. Sherrod lives in [place] and works at [employer] and as a part Horticulturist, commercial. Germane lives in [place] and is a Engineer, drilling for a medical company. Beverely Low lives in [place] and works at [employer]. Jayvier has 7 grandchildren and 1 step great grandchild. He attends [employer].    ADVANCED DIRECTIVES: His wife, [name], is automatically his healthcare power of attorney.     HEALTH MAINTENANCE: Social History   Tobacco Use   Smoking status: Former Smoker    Years:  4.00    Quit date: 09/12/1967    Years since quitting: 51.9   Smokeless tobacco: Never Used  Substance Use Topics   Alcohol use: No   Drug use: No    Colonoscopy: 2018/[facility]  PSA: 0.54 on 09/18/2018  No Known Allergies  Current Outpatient Medications  Medication Sig Dispense Refill   amLODipine (NORVASC) 5 MG tablet TAKE ONE TABLET BY MOUTH ONE TIME DAILY  90 tablet 2   apixaban (ELIQUIS) 2.5 MG TABS tablet Take 1 tablet (2.5 mg total) by mouth 2 (two) times daily. 60 tablet 11   benazepril (LOTENSIN) 40 MG tablet TAKE ONE TABLET BY MOUTH ONE TIME DAILY  90 tablet 2   Cholecalciferol 1000 UNITS tablet Take 1,000 Units by mouth daily.      dexamethasone (DECADRON) 4 MG tablet Take 2 tablets (8 mg total) by mouth daily. Start the day after chemotherapy for 2 days. Take with food. 30 tablet 1   latanoprost (XALATAN) 0.005 % ophthalmic  solution Place 1 drop into both eyes at bedtime.      lidocaine-prilocaine (EMLA) cream Apply to affected area once 30 g 3   ondansetron (ZOFRAN) 4 MG tablet Take 1 tablet (4 mg total) by mouth every 8 (eight) hours as needed for nausea or vomiting. 20 tablet 1   prochlorperazine (COMPAZINE) 10 MG tablet Take 1 tablet (10 mg total) by mouth every 6 (six) hours as needed (Nausea or vomiting). 30 tablet 1   No current facility-administered medications for this visit.    Facility-Administered Medications Ordered in Other Visits  Medication Dose Route Frequency Provider Last Rate Last Dose   sodium chloride flush (NS) 0.9 % injection 10 mL  10 mL Intravenous PRN Magrinat, Virgie Dad, MD         OBJECTIVE: Elderly African-American man who appears stated age  84:   08/12/19 1252  BP: 140/87  Pulse: 74  Resp: 17  Temp: 98.9 F (37.2 C)  SpO2: 100%   Wt Readings from Last 3 Encounters:  08/12/19 150 lb 3.2 oz (68.1 kg)  07/29/19 150 lb 9.6 oz (68.3 kg)  07/22/19 152 lb 6.4 oz (69.1 kg)   Body mass index is 25.38 kg/m.    ECOG FS:1 - Symptomatic but completely ambulatory GENERAL: Patient is a well appearing male in no acute distress HEENT:  Sclerae anicteric.  Mask in place Neck is supple.  NODES:  No cervical, supraclavicular, or axillary lymphadenopathy palpated.  LUNGS:  Clear to auscultation bilaterally.  No wheezes or rhonchi. HEART:  Regular rate and rhythm. No murmur appreciated. ABDOMEN:  Soft, nontender.  Positive, normoactive bowel sounds. No organomegaly palpated. MSK:  No focal spinal tenderness to palpation. EXTREMITIES:  No peripheral edema.   SKIN:  Clear with no obvious rashes or skin changes. No nail dyscrasia. NEURO:  Nonfocal. Well oriented.  Appropriate affect.     LAB RESULTS:  CMP     Component Value Date/Time   NA 139 08/12/2019 1227   K 3.9 08/12/2019 1227   CL 104 08/12/2019 1227   CO2 26 08/12/2019 1227   GLUCOSE 100 (H) 08/12/2019 1227     GLUCOSE 105 (H) 09/13/2006 0900   BUN 16 08/12/2019 1227   CREATININE 1.18 08/12/2019 1227   CREATININE 1.02 10/16/2018 0936   CALCIUM 8.6 (L) 08/12/2019 1227   PROT 6.3 (L) 08/12/2019 1227   ALBUMIN 3.5 08/12/2019 1227   AST 38 08/12/2019 1227   AST 76 (H) 10/16/2018 0936   ALT 41  08/12/2019 1227   ALT 47 (H) 10/16/2018 0936   ALKPHOS 140 (H) 08/12/2019 1227   BILITOT 0.6 08/12/2019 1227   BILITOT 0.7 10/16/2018 0936   GFRNONAA 57 (L) 08/12/2019 1227   GFRNONAA >60 10/16/2018 0936   GFRAA >60 08/12/2019 1227   GFRAA >60 10/16/2018 0936    No results found for: TOTALPROTELP, ALBUMINELP, A1GS, A2GS, BETS, BETA2SER, GAMS, MSPIKE, SPEI  No results found for: KPAFRELGTCHN, LAMBDASER, KAPLAMBRATIO  Lab Results  Component Value Date   WBC 3.5 (L) 08/12/2019   NEUTROABS 1.8 08/12/2019   HGB 9.1 (L) 08/12/2019   HCT 29.3 (L) 08/12/2019   MCV 82.8 08/12/2019   PLT 180 08/12/2019    No results found for: LABCA2  No components found for: [id number]  No results for input(s): INR in the last 168 hours.  No results found for: LABCA2  Lab Results  Component Value Date   CAN199 58 (H) 07/22/2019    No results found for: POE423  No results found for: NTI144  No results found for: CA2729  No components found for: HGQUANT  Lab Results  Component Value Date   CEA1 30.29 (H) 07/22/2019   /  CEA ([facility])  Date Value Ref Range Status  07/22/2019 30.29 (H) 0.00 - 5.00 ng/mL Final    Comment:    (NOTE) This test was performed using Architect's Chemiluminescent Microparticle Immunoassay. Values obtained from different assay methods cannot be used interchangeably. Please note that 5-10% of patients who smoke may see CEA levels up to 6.9 ng/mL. Performed at [facility], [place] [address], [place], [place] [address]      No results found for: AFPTUMOR  No results found for: [place]  No results found for: PSA1  Appointment on  08/12/2019  Component Date Value Ref Range Status   Sodium 08/12/2019 139  135 - 145 mmol/L Final   Potassium 08/12/2019 3.9  3.5 - 5.1 mmol/L Final   Chloride 08/12/2019 104  98 - 111 mmol/L Final   CO2 08/12/2019 26  22 - 32 mmol/L Final   Glucose, Bld 08/12/2019 100* 70 - 99 mg/dL Final   BUN 08/12/2019 16  8 - 23 mg/dL Final   Creatinine, Ser 08/12/2019 1.18  0.61 - 1.24 mg/dL Final   Calcium 08/12/2019 8.6* 8.9 - 10.3 mg/dL Final   Total Protein 08/12/2019 6.3* 6.5 - 8.1 g/dL Final   Albumin 08/12/2019 3.5  3.5 - 5.0 g/dL Final   AST 08/12/2019 38  15 - 41 U/L Final   ALT 08/12/2019 41  0 - 44 U/L Final   Alkaline Phosphatase 08/12/2019 140* 38 - 126 U/L Final   Total Bilirubin 08/12/2019 0.6  0.3 - 1.2 mg/dL Final   GFR calc non Af Amer 08/12/2019 57* >60 mL/min Final   GFR calc Af Amer 08/12/2019 >60  >60 mL/min Final   Anion gap 08/12/2019 9  5 - 15 Final   Performed at [facility], [place] [address] [place], [place] [address]   WBC 08/12/2019 3.5* 4.0 - 10.5 K/uL Final   RBC 08/12/2019 3.54* 4.22 - 5.81 MIL/uL Final   Hemoglobin 08/12/2019 9.1* 13.0 - 17.0 g/dL Final   HCT 08/12/2019 29.3* 39.0 - 52.0 % Final   MCV 08/12/2019 82.8  80.0 - 100.0 fL Final   MCH 08/12/2019 25.7* 26.0 - 34.0 pg Final   MCHC 08/12/2019 31.1  30.0 - 36.0 g/dL Final   RDW 08/12/2019 20.4* 11.5 - 15.5 %  Final   Platelets 08/12/2019 180  150 - 400 K/uL Final   nRBC 08/12/2019 0.0  0.0 - 0.2 % Final   Neutrophils Relative % 08/12/2019 52  % Final   Neutro Abs 08/12/2019 1.8  1.7 - 7.7 K/uL Final   Lymphocytes Relative 08/12/2019 30  % Final   Lymphs Abs 08/12/2019 1.1  0.7 - 4.0 K/uL Final   Monocytes Relative 08/12/2019 13  % Final   Monocytes Absolute 08/12/2019 0.5  0.1 - 1.0 K/uL Final   Eosinophils Relative 08/12/2019 4  % Final   Eosinophils Absolute 08/12/2019 0.1  0.0 - 0.5 K/uL Final   Basophils Relative 08/12/2019 1  % Final     Basophils Absolute 08/12/2019 0.0  0.0 - 0.1 K/uL Final   Immature Granulocytes 08/12/2019 0  % Final   Abs Immature Granulocytes 08/12/2019 0.01  0.00 - 0.07 K/uL Final   Performed at [facility], [place] [address], [place], [place] [address]    (this displays the last labs from the last 3 days)  No results found for: TOTALPROTELP, ALBUMINELP, A1GS, A2GS, BETS, BETA2SER, GAMS, MSPIKE, SPEI (this displays SPEP labs)  No results found for: KPAFRELGTCHN, LAMBDASER, KAPLAMBRATIO (kappa/lambda light chains)  No results found for: HGBA, HGBA2QUANT, HGBFQUANT, HGBSQUAN (Hemoglobinopathy evaluation)   No results found for: LDH  Lab Results  Component Value Date   IRON 38 (L) 09/21/2018   TIBC 251 09/21/2018   IRONPCTSAT 15 (L) 09/21/2018   (Iron and TIBC)  Lab Results  Component Value Date   FERRITIN 138 09/21/2018    Urinalysis    Component Value Date/Time   COLORURINE YELLOW 02/18/2019 1056   APPEARANCEUR CLEAR 02/18/2019 1056   LABSPEC 1.018 02/18/2019 1056   PHURINE 6.0 02/18/2019 1056   GLUCOSEU NEGATIVE 02/18/2019 1056   GLUCOSEU NEGATIVE 07/20/2017 1056   HGBUR NEGATIVE 02/18/2019 [place] 02/18/2019 [place] 02/18/2019 1056   PROTEINUR 30 (A) 02/18/2019 1056   UROBILINOGEN 0.2 07/20/2017 1056   NITRITE NEGATIVE 02/18/2019 [place] 02/18/2019 1056     STUDIES:  Ct Abdomen Pelvis W Contrast  Result Date: 07/16/2019 CLINICAL DATA:  Restaging of metastatic gastric adenocarcinoma. History prostate cancer. EXAM: CT ABDOMEN AND PELVIS WITH CONTRAST TECHNIQUE: Multidetector CT imaging of the abdomen and pelvis was performed using the standard protocol following bolus administration of intravenous contrast. CONTRAST:  148m OMNIPAQUE IOHEXOL 300 MG/ML  SOLN COMPARISON:  05/15/2019 FINDINGS: Lower chest: Stable coarse interstitial accentuation at the lung bases. Hepatobiliary: Multiple  scattered hepatic metastatic lesions are present with interval enlargement of some of the lesions. One of the dominant right hepatic lobe lesions measures 4.2 by 3.2 cm on image 9/2, formerly 2.9 by 2.7 cm. A lesion in segment 4B of the liver measures 2.1 by 1.5 cm on image 21/2 and was not previously readily apparent. Gallbladder unremarkable. Extrahepatic biliary tree unremarkable. Pancreas: Unremarkable Spleen: Unremarkable Adrenals/Urinary Tract: Small hypodense lesion of the right kidney lower pole is likely a cyst. Adrenal glands unremarkable. Stomach/Bowel: The gastric mass is probably in the gastric body for example on image 65/4. There is abnormal nodularity of the omentum just below the stomach, with a band of tumor in this vicinity measuring up to about 1.3 cm in thickness on image 64/4 (previously 1.2 cm). Prominent stool throughout the colon favors constipation. Vascular/Lymphatic: Aortoiliac atherosclerotic vascular disease. 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 Reproductive: Fiducials along  the margins of the prostate gland. Other: No supplemental non-categorized findings. Musculoskeletal: Lumbar spondylosis and degenerative disc disease causing multilevel impingement. Small umbilical hernia contains adipose tissue. IMPRESSION: 1. Progressive hepatic metastatic disease with new and enlarging lesions. Roughly similar band of tumor along the upper omentum just below the stomach. 2. Prominent stool throughout the colon favors constipation. 3. 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 Lumbar spondylosis and degenerative disc disease causing multilevel impingement. Fiducials along the margins of the prostate gland. Small umbilical hernia contains adipose tissue. Stable coarse interstitial accentuation at the lung bases. Aortic Atherosclerosis (ICD10-I70.0). Electronically  Signed   By: Van Clines M.D.   On: 07/16/2019 12:38     ELIGIBLE FOR AVAILABLE RESEARCH PROTOCOL: no   ASSESSMENT: 84 y.o. [place], [place] man status post gastric biopsy 09/20/2018 showing adenocarcinoma, with a CA 19-9 greater than 65,000; staging studies showing an apparent mass adjacent to the head of the pancreas, innumerable liver lesions, upper abdominal mesenteric and peripancreatic lymphadenopathy, and an isolated sclerotic density in the left iliac bone  (a) genomics requested on gastric biopsy showed the tumor to be HER-2 amplified at 3+, PD-L1 positive, and T p53 mutated.  MSI is stable, mismatch repair status is proficient and the tumor mutational burden is intermediate.  (1) chest CT scan 09/19/2018 shows right lower lobe pulmonary embolism  (a) on intravenous heparin started 09/19/2018, transitioned to apixaban 10/01/2018  (2) genetics testing 10/04/2018: negative  (a) family history of prostate and early breast cancer; patient has a history of prostate cancer  (3) started gemcitabine/Abraxane 10/09/2018, repeated days 1, 8 and 15 of each 28-day cycle  (a) stopped after 1 cycle (3 doses) with reassessment of diagnosis based on CARIS results, noted above  (4) started oxaliplatin, capecitabine and trastuzumab 11/06/2018  (a) baseline echocardiogram 09/20/2018 shows an ejection fraction in the 55-60% range  (b) oxaliplatin and trastuzumab every 21 days started 11/06/2018  (c) capecitabine dose 1000 mg twice daily for 14 days of every 21-day cycle  (d) CT of the abdomen and pelvis 02/11/2019 shows marked improvement in his liver lesions and resolution of periportal adenopathy  (e) tumor markers also show significant response,  (f) echocardiogram 02/12/2019 shows an ejection fraction in the 60-65% range  (g) CT abd/pelvis show stable/ improved disease, tumor markers nearly normal  (h) oxaliplatin stopped aftwer 04/30/2019 dose  (i) capecitabine stopped after September  cycle   (5) trastuzumab maintenance started 05/20/2019  (6) disease progression documented by lab work and CT scan 07/16/2019  (7) started FOLFOX 07/29/2019, continuing trastuzumab (every 14 days).  (a) Plan is to follow markers and after total of 4 cycles restaging   PLAN: Jaedon is doing well today.  He continues on FOLFOX and Trastuzumab every 2 weeks and thus far is tolerating it well.  I reviewed his CBC with him which is stable.  I reviewed his overall treatment plan thus far which is to do four cycles of his current treatment, while following his tumor markers (currently pending) and then imaging.    I placed orders for a repeat echocardiogram for later this month and my nurse is working on getting him over to Dr. Haroldine Laws or Dr. Aundra Dubin.    Willi and I discussed healthy diet and continued exercise.  He will return in 2 days for pump stop, and in 2 weeks for his next treatment.    He was recommended to continue with the appropriate pandemic precautions. He  knows to call for any questions that may arise between now and his next appointment.  We are happy to see him sooner if needed.  A total of (20) minutes of face-to-face time was spent with this patient with greater than 50% of that time in counseling and care-coordination.   Wilber Bihari, NP  08/12/19 1:19 PM Medical Oncology and Hematology [facility] [place], [place] [address] Tel. [phone]    Fax. [phone]

## 2019-08-12 NOTE — Patient Instructions (Signed)
[facility] Discharge Instructions for Patients Receiving Chemotherapy  Today you received the following chemotherapy agents Trastuzumab [facility]), oxaliplatin, leucovorin, Adrucil.  To help prevent nausea and vomiting after your treatment, we encourage you to take your nausea medication as prescribed.   If you develop nausea and vomiting that is not controlled by your nausea medication, call the clinic.   BELOW ARE SYMPTOMS THAT SHOULD BE REPORTED IMMEDIATELY:  *FEVER GREATER THAN 100.5 F  *CHILLS WITH OR WITHOUT FEVER  NAUSEA AND VOMITING THAT IS NOT CONTROLLED WITH YOUR NAUSEA MEDICATION  *UNUSUAL SHORTNESS OF BREATH  *UNUSUAL BRUISING OR BLEEDING  TENDERNESS IN MOUTH AND THROAT WITH OR WITHOUT PRESENCE OF ULCERS  *URINARY PROBLEMS  *BOWEL PROBLEMS  UNUSUAL RASH Items with * indicate a potential emergency and should be followed up as soon as possible.  Feel free to call the clinic should you have any questions or concerns. The clinic phone number is [phone].  Please show the [place] at check-in to the Emergency Department and triage nurse.

## 2019-08-13 LAB — CANCER ANTIGEN 19-9: CA 19-9: 73 U/mL — ABNORMAL HIGH (ref 0–35)

## 2019-08-14 ENCOUNTER — Other Ambulatory Visit: Payer: Self-pay

## 2019-08-14 ENCOUNTER — Inpatient Hospital Stay: Payer: Medicare Other

## 2019-08-14 VITALS — BP 132/73 | HR 63 | Temp 98.0°F | Resp 16

## 2019-08-14 DIAGNOSIS — C169 Malignant neoplasm of stomach, unspecified: Secondary | ICD-10-CM

## 2019-08-14 DIAGNOSIS — C168 Malignant neoplasm of overlapping sites of stomach: Secondary | ICD-10-CM

## 2019-08-14 DIAGNOSIS — Z95828 Presence of other vascular implants and grafts: Secondary | ICD-10-CM

## 2019-08-14 DIAGNOSIS — C799 Secondary malignant neoplasm of unspecified site: Secondary | ICD-10-CM

## 2019-08-14 DIAGNOSIS — C787 Secondary malignant neoplasm of liver and intrahepatic bile duct: Secondary | ICD-10-CM

## 2019-08-14 DIAGNOSIS — Z5111 Encounter for antineoplastic chemotherapy: Secondary | ICD-10-CM | POA: Diagnosis not present

## 2019-08-14 MED ORDER — HEPARIN SOD (PORK) LOCK FLUSH 100 UNIT/ML IV SOLN
500.0000 [IU] | Freq: Once | INTRAVENOUS | Status: AC | PRN
  Administered 2019-08-14: 500 [IU]
  Filled 2019-08-14: qty 5

## 2019-08-14 MED ORDER — SODIUM CHLORIDE 0.9% FLUSH
10.0000 mL | INTRAVENOUS | Status: DC | PRN
  Administered 2019-08-14: 10 mL
  Filled 2019-08-14: qty 10

## 2019-08-25 ENCOUNTER — Other Ambulatory Visit: Payer: Self-pay

## 2019-08-25 ENCOUNTER — Ambulatory Visit (HOSPITAL_COMMUNITY)
Admission: RE | Admit: 2019-08-25 | Discharge: 2019-08-25 | Disposition: A | Discharge status: Home / Self Care | Payer: Medicare Other | Source: Ambulatory Visit | Attending: Adult Health | Admitting: Adult Health

## 2019-08-25 DIAGNOSIS — Z87891 Personal history of nicotine dependence: Secondary | ICD-10-CM | POA: Insufficient documentation

## 2019-08-25 DIAGNOSIS — C162 Malignant neoplasm of body of stomach: Secondary | ICD-10-CM | POA: Diagnosis not present

## 2019-08-25 DIAGNOSIS — Z01818 Encounter for other preprocedural examination: Secondary | ICD-10-CM | POA: Insufficient documentation

## 2019-08-25 DIAGNOSIS — I071 Rheumatic tricuspid insufficiency: Secondary | ICD-10-CM | POA: Insufficient documentation

## 2019-08-25 DIAGNOSIS — I119 Hypertensive heart disease without heart failure: Secondary | ICD-10-CM | POA: Insufficient documentation

## 2019-08-25 DIAGNOSIS — C787 Secondary malignant neoplasm of liver and intrahepatic bile duct: Secondary | ICD-10-CM

## 2019-08-25 NOTE — Progress Notes (Signed)
Echocardiogram 2D Echocardiogram has been performed.  [place] Deputy Velisa Regnier 08/25/2019, 11:55 AM

## 2019-08-26 ENCOUNTER — Inpatient Hospital Stay: Payer: Medicare Other

## 2019-08-26 ENCOUNTER — Encounter: Payer: Self-pay | Admitting: Adult Health

## 2019-08-26 ENCOUNTER — Inpatient Hospital Stay (HOSPITAL_BASED_OUTPATIENT_CLINIC_OR_DEPARTMENT_OTHER): Payer: Medicare Other | Admitting: Adult Health

## 2019-08-26 ENCOUNTER — Other Ambulatory Visit: Payer: Self-pay

## 2019-08-26 VITALS — BP 141/90 | HR 62 | Temp 98.0°F | Resp 17 | Ht 64.5 in | Wt 144.9 lb

## 2019-08-26 DIAGNOSIS — C169 Malignant neoplasm of stomach, unspecified: Secondary | ICD-10-CM

## 2019-08-26 DIAGNOSIS — C799 Secondary malignant neoplasm of unspecified site: Secondary | ICD-10-CM

## 2019-08-26 DIAGNOSIS — C787 Secondary malignant neoplasm of liver and intrahepatic bile duct: Secondary | ICD-10-CM

## 2019-08-26 DIAGNOSIS — Z5111 Encounter for antineoplastic chemotherapy: Secondary | ICD-10-CM | POA: Diagnosis not present

## 2019-08-26 DIAGNOSIS — C168 Malignant neoplasm of overlapping sites of stomach: Secondary | ICD-10-CM

## 2019-08-26 DIAGNOSIS — D649 Anemia, unspecified: Secondary | ICD-10-CM

## 2019-08-26 DIAGNOSIS — Z95828 Presence of other vascular implants and grafts: Secondary | ICD-10-CM

## 2019-08-26 DIAGNOSIS — C162 Malignant neoplasm of body of stomach: Secondary | ICD-10-CM

## 2019-08-26 LAB — COMPREHENSIVE METABOLIC PANEL
ALT: 29 U/L (ref 0–44)
AST: 33 U/L (ref 15–41)
Albumin: 3.6 g/dL (ref 3.5–5.0)
Alkaline Phosphatase: 122 U/L (ref 38–126)
Anion gap: 8 (ref 5–15)
BUN: 12 mg/dL (ref 8–23)
CO2: 28 mmol/L (ref 22–32)
Calcium: 8.6 mg/dL — ABNORMAL LOW (ref 8.9–10.3)
Chloride: 105 mmol/L (ref 98–111)
Creatinine, Ser: 0.98 mg/dL (ref 0.61–1.24)
GFR calc Af Amer: >60 mL/min (ref >60)
GFR calc non Af Amer: >60 mL/min (ref >60)
Glucose, Bld: 101 mg/dL — ABNORMAL HIGH (ref 70–99)
Potassium: 3.9 mmol/L (ref 3.5–5.1)
Sodium: 141 mmol/L (ref 135–145)
Total Bilirubin: 0.6 mg/dL (ref 0.3–1.2)
Total Protein: 6.5 g/dL (ref 6.5–8.1)

## 2019-08-26 LAB — CBC WITH DIFFERENTIAL/PLATELET
Abs Immature Granulocytes: 0.01 10*3/uL (ref 0.00–0.07)
Basophils Absolute: 0 10*3/uL (ref 0.0–0.1)
Basophils Relative: 1 %
Eosinophils Absolute: 0.1 10*3/uL (ref 0.0–0.5)
Eosinophils Relative: 2 %
HCT: 30.9 % — ABNORMAL LOW (ref 39.0–52.0)
Hemoglobin: 9.3 g/dL — ABNORMAL LOW (ref 13.0–17.0)
Immature Granulocytes: 0 %
Lymphocytes Relative: 33 %
Lymphs Abs: 1.1 10*3/uL (ref 0.7–4.0)
MCH: 25.3 pg — ABNORMAL LOW (ref 26.0–34.0)
MCHC: 30.1 g/dL (ref 30.0–36.0)
MCV: 84 fL (ref 80.0–100.0)
Monocytes Absolute: 0.5 10*3/uL (ref 0.1–1.0)
Monocytes Relative: 14 %
Neutro Abs: 1.6 10*3/uL — ABNORMAL LOW (ref 1.7–7.7)
Neutrophils Relative %: 50 %
Platelets: 166 10*3/uL (ref 150–400)
RBC: 3.68 MIL/uL — ABNORMAL LOW (ref 4.22–5.81)
RDW: 20.7 % — ABNORMAL HIGH (ref 11.5–15.5)
WBC: 3.2 10*3/uL — ABNORMAL LOW (ref 4.0–10.5)
nRBC: 0 % (ref 0.0–0.2)

## 2019-08-26 MED ORDER — OXALIPLATIN CHEMO INJECTION 100 MG/20ML
85.0000 mg/m2 | Freq: Once | INTRAVENOUS | Status: AC
  Administered 2019-08-26: 150 mg via INTRAVENOUS
  Filled 2019-08-26: qty 20

## 2019-08-26 MED ORDER — PALONOSETRON HCL INJECTION 0.25 MG/5ML
INTRAVENOUS | Status: AC
  Filled 2019-08-26: qty 5

## 2019-08-26 MED ORDER — DEXTROSE 5 % IV SOLN
Freq: Once | INTRAVENOUS | Status: AC
  Filled 2019-08-26: qty 250

## 2019-08-26 MED ORDER — DIPHENHYDRAMINE HCL 25 MG PO CAPS
25.0000 mg | ORAL_CAPSULE | Freq: Once | ORAL | Status: AC
  Administered 2019-08-26: 25 mg via ORAL

## 2019-08-26 MED ORDER — TRASTUZUMAB-ANNS CHEMO 150 MG IV SOLR
4.0000 mg/kg | Freq: Once | INTRAVENOUS | Status: AC
  Administered 2019-08-26: 252 mg via INTRAVENOUS
  Filled 2019-08-26: qty 12

## 2019-08-26 MED ORDER — SODIUM CHLORIDE 0.9% FLUSH
10.0000 mL | Freq: Once | INTRAVENOUS | Status: AC
  Administered 2019-08-26: 10 mL
  Filled 2019-08-26: qty 10

## 2019-08-26 MED ORDER — ACETAMINOPHEN 325 MG PO TABS
ORAL_TABLET | ORAL | Status: AC
  Filled 2019-08-26: qty 2

## 2019-08-26 MED ORDER — ACETAMINOPHEN 325 MG PO TABS
650.0000 mg | ORAL_TABLET | Freq: Once | ORAL | Status: AC
  Administered 2019-08-26: 650 mg via ORAL

## 2019-08-26 MED ORDER — DIPHENHYDRAMINE HCL 25 MG PO CAPS
ORAL_CAPSULE | ORAL | Status: AC
  Filled 2019-08-26: qty 1

## 2019-08-26 MED ORDER — SODIUM CHLORIDE 0.9 % IV SOLN
INTRAVENOUS | Status: DC
  Filled 2019-08-26: qty 250

## 2019-08-26 MED ORDER — FLUOROURACIL CHEMO INJECTION 2.5 GM/50ML
400.0000 mg/m2 | Freq: Once | INTRAVENOUS | Status: AC
  Administered 2019-08-26: 700 mg via INTRAVENOUS
  Filled 2019-08-26: qty 14

## 2019-08-26 MED ORDER — SODIUM CHLORIDE 0.9 % IV SOLN
Freq: Once | INTRAVENOUS | Status: AC
  Filled 2019-08-26: qty 5

## 2019-08-26 MED ORDER — PALONOSETRON HCL INJECTION 0.25 MG/5ML
0.2500 mg | Freq: Once | INTRAVENOUS | Status: AC
  Administered 2019-08-26: 0.25 mg via INTRAVENOUS

## 2019-08-26 MED ORDER — LEUCOVORIN CALCIUM INJECTION 350 MG
400.0000 mg/m2 | Freq: Once | INTRAVENOUS | Status: AC
  Administered 2019-08-26: 708 mg via INTRAVENOUS
  Filled 2019-08-26: qty 35.4

## 2019-08-26 MED ORDER — SODIUM CHLORIDE 0.9 % IV SOLN
2400.0000 mg/m2 | INTRAVENOUS | Status: DC
  Administered 2019-08-26: 4250 mg via INTRAVENOUS
  Filled 2019-08-26: qty 85

## 2019-08-26 NOTE — Patient Instructions (Signed)
[facility] Discharge Instructions for Patients Receiving Chemotherapy  Today you received the following chemotherapy agents Trastuzumab [facility]), oxaliplatin, leucovorin, Adrucil.  To help prevent nausea and vomiting after your treatment, we encourage you to take your nausea medication as prescribed.   If you develop nausea and vomiting that is not controlled by your nausea medication, call the clinic.   BELOW ARE SYMPTOMS THAT SHOULD BE REPORTED IMMEDIATELY:  *FEVER GREATER THAN 100.5 F  *CHILLS WITH OR WITHOUT FEVER  NAUSEA AND VOMITING THAT IS NOT CONTROLLED WITH YOUR NAUSEA MEDICATION  *UNUSUAL SHORTNESS OF BREATH  *UNUSUAL BRUISING OR BLEEDING  TENDERNESS IN MOUTH AND THROAT WITH OR WITHOUT PRESENCE OF ULCERS  *URINARY PROBLEMS  *BOWEL PROBLEMS  UNUSUAL RASH Items with * indicate a potential emergency and should be followed up as soon as possible.  Feel free to call the clinic should you have any questions or concerns. The clinic phone number is [phone].  Please show the [place] at check-in to the Emergency Department and triage nurse.

## 2019-08-26 NOTE — Progress Notes (Signed)
[place]  [phone]) [phone] Fax:(336) [phone]     ID: Peter Wood DOB: [birth year]  MR#: [id number]  INO#:[id number]  Patient Care Team: Cassandria Anger, MD as PCP - General Milus Banister, MD as Attending Physician (Gastroenterology) Magrinat, Virgie Dad, MD as Consulting Physician (Oncology) Gatha Mayer, MD as Consulting Physician (Gastroenterology) [name], MD as Consulting Physician (Ophthalmology) Ramon Dredge, MD as Referring Physician (Radiation Oncology) Larey Dresser, MD as Consulting Physician (Cardiology) OTHER MD:   CHIEF COMPLAINT: Metastatic gastric adenocarcinoma  CURRENT TREATMENT: FOLFOX, trastuzumab   INTERVAL HISTORY: Peter Wood returns today for follow-up and treatment of his metastatic gastric adenocarcinoma.   Peter Wood was restarted on FOLFOX due to progression that was noted on CT scan earlier last month.  Peter Wood started treatment on 07/29/2019 to be given on day 1 of a 14 day cycle.  Today is cycle 3 day 1 of treatment.    Peter Wood continues on trastuzumab with good tolerance. Peter Wood receives this every 2 weeks.    His most recent echocardiogram on 08/25/2019 showed an ejection fraction of 60-65%.  Peter Wood has a new patient appointment with Dr. Haroldine Laws on 09/22/2019.     REVIEW OF SYSTEMS: Bonham is doing well.  Peter Wood had a decreased appetite x 2 days last week.  Peter Wood notes Peter Wood didn't want to eat anything.  Peter Wood is down 6 pounds since his last visit.  Peter Wood has boost that Peter Wood can drink.  It helps some.    Giuseppe denies any fever, chills, chest pain, palpitations, cough, bowel/baldder changes, headaches, vision issues, neuropathy.  Peter Wood says his activity level remains good.  Peter Wood hasn't had fatigue or balance issues.  During the day Peter Wood walks indoors and helps around the house.  Peter Wood is practicing current pandemic precautions.    Bryann brought in a drink called Soursop juice.  The main ingredient is from the [place] fruit.  Peter Wood wants to know if  it is safe to take it.    HISTORY OF CURRENT ILLNESS: From the original intake note:  Peter Wood presented to the emergency room 09/07/2018 with mid/upper abdominal pain.  Peter Wood reported weight loss of 25 pounds over the past 6 months and significant anorexia.  His liver function tests were abnormal and a right upper quadrant ultrasound was obtained.  This was read as concerning for malignancy and a CT of the abdomen and pelvis with contrast was obtained the same day, showing  1. Too numerous to count hypodense masses of the liver concerning for hepatic metastasis. In the region of the porta hepatis, there are hypodense lesions more likely associated with the liver though the pancreatic head is partially obscured by a 3.2 cm hypodense mass. Findings are more likely associated with the liver and less likely an isolated pancreatic mass. 2. Upper abdominal mesenteric and peripancreatic lymphadenopathy suspicious for metastatic disease. 3. 6 mm sclerotic density in the left iliac bone adjacent to the SI joint. Given history of prostate cancer, differential considerations would include osteoblastic metastasis or possibly bone island. Showed only diverticular disease.  Peter Wood was referred to GI and Dr. Ardis Hughs notes a colonoscopy performed 04/23/2017 makes the possibility of colon cancer unlikely.  Further work-up was planned, but on 09/19/2018 the patient again presented to the emergency room with chest pain, now in a different area, and a staging CT scan of the chest showed filling defects in the right lower lobe pulmonary arteries consistent with embolism.  There was a small pericardial  effusion and an additional 2.4 cm left thyroid mass.  Aortic atherosclerosis was noted  Peter Wood was started on intravenous heparin and on 09/20/2018 underwent upper endoscopy under Dr. Carlean Purl showing a giant nonbleeding cratered gastric ulcer in the posterior wall of the stomach.  Biopsies of this procedure showed (SZA [id number])  adenocarcinoma.   At that point we were consulted and we obtained tumor markers which included a CEA of 1318.2, a CA 19-9 of 67,479, a normal AFP and a normal PSA.  I discussed the case with pathology and they do not feel that additional testing would be helpful in terms of discriminating between a primary gastric and a primary biliary/pancreatic tumor.  The patient's subsequent history is as detailed below.   PAST MEDICAL HISTORY: Past Medical History:  Diagnosis Date  . Arthritis   . Diverticulosis of colon   . Family history of breast cancer   . Family history of prostate cancer   . HTN (hypertension)   . Hx of colonic polyp   . Pneumonia    as a child/baby  . Poor circulation of extremity    right leg  . Prostate cancer ([place]) 2015   prostate cancer - radiation treated     PAST SURGICAL HISTORY: Past Surgical History:  Procedure Laterality Date  . [place] VITRECTOMY WITH 20 GAUGE MVR PORT FOR MACULAR HOLE Right 09/19/2016   Procedure: 25 GAUGE PARS PLANA VITRECTOMY WITH 20 GAUGE MVR PORT FOR MACULAR HOLE, MEMBRANE PEEL, SERUM PATCH, GAS FLUID EXCHANGE, HEADSCOPE LASER;  Surgeon: [name], MD;  Location: [facility];  Service: Ophthalmology;  Laterality: Right;  . BIOPSY  09/20/2018   Procedure: BIOPSY;  Surgeon: Gatha Mayer, MD;  Location: [facility] ENDOSCOPY;  Service: Endoscopy;;  . CATARACT EXTRACTION  08/27/12   Right  . COLONOSCOPY    . ESOPHAGOGASTRODUODENOSCOPY (EGD) WITH PROPOFOL N/A 09/20/2018   Procedure: ESOPHAGOGASTRODUODENOSCOPY (EGD) WITH PROPOFOL;  Surgeon: Gatha Mayer, MD;  Location: [facility];  Service: Endoscopy;  Laterality: N/A;  . HAND SURGERY  2016   right thumb  . IR IMAGING GUIDED PORT INSERTION  11/04/2018  . KNEE SURGERY Left 1989     FAMILY HISTORY: Family History  Problem Relation Age of Onset  . Prostate cancer Father        dx >50  . Hypertension Mother   . Ulcers Mother   . Breast cancer Sister        dx 20's  .  Prostate cancer Brother        dx under 64, metasttic, cause of his death  . Ulcers Brother   . Ulcers Sister    Abdoul's father died from heart complications with a pacemaker at age 18; Kallin's father also has prostate cancer. Patients' mother died from natural causes at age 25. The patient has 4 brothers and 3 sisters. One of Lane sisters had breast cancer in her 69s and a brother had prostate cancer. Patient denies anyone in her family having ovarian, or pancreatic cancer. Maeson mentions that his youngest sister and his brother with prostate cancer also had stomach ulcers.    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 His wife, [name], is a retired [employer] summer Radiographer, therapeutic. As of 09/2018, they have been married for 56 years. They have 4 children, Claiborne Billings, [place], [place], and [place]. Claiborne Billings lives in [place] and works at the [employer]. Sherrod lives in [place] and works at [employer] and as a part Horticulturist, commercial.  Germane lives in [place] and is a Engineer, drilling for a medical company. Beverely Low lives in [place] and works at [employer]. Worthington has 7 grandchildren and 1 step great grandchild. Peter Wood attends [employer].    ADVANCED DIRECTIVES: His wife, [name], is automatically his healthcare power of attorney.     HEALTH MAINTENANCE: Social History   Tobacco Use  . Smoking status: Former Smoker    Years: 4.00    Quit date: 09/12/1967    Years since quitting: 51.9  . Smokeless tobacco: Never Used  Substance Use Topics  . Alcohol use: No  . Drug use: No    Colonoscopy: 2018/[facility]  PSA: 0.54 on 09/18/2018  No Known Allergies  Current Outpatient Medications  Medication Sig Dispense Refill  . amLODipine (NORVASC) 5 MG tablet TAKE ONE TABLET BY MOUTH ONE TIME DAILY  90 tablet 2  . apixaban (ELIQUIS) 2.5 MG TABS tablet Take 1 tablet (2.5 mg total) by mouth 2 (two) times daily. 60 tablet 11  . benazepril (LOTENSIN) 40 MG tablet TAKE ONE TABLET  BY MOUTH ONE TIME DAILY  90 tablet 2  . Cholecalciferol 1000 UNITS tablet Take 1,000 Units by mouth daily.     Marland Kitchen dexamethasone (DECADRON) 4 MG tablet Take 2 tablets (8 mg total) by mouth daily. Start the day after chemotherapy for 2 days. Take with food. 30 tablet 1  . latanoprost (XALATAN) 0.005 % ophthalmic solution Place 1 drop into both eyes at bedtime.     . lidocaine-prilocaine (EMLA) cream Apply to affected area once 30 g 3  . ondansetron (ZOFRAN) 4 MG tablet Take 1 tablet (4 mg total) by mouth every 8 (eight) hours as needed for nausea or vomiting. 20 tablet 1  . prochlorperazine (COMPAZINE) 10 MG tablet Take 1 tablet (10 mg total) by mouth every 6 (six) hours as needed (Nausea or vomiting). 30 tablet 1   No current facility-administered medications for this visit.   Facility-Administered Medications Ordered in Other Visits  Medication Dose Route Frequency Provider Last Rate Last Admin  . sodium chloride flush (NS) 0.9 % injection 10 mL  10 mL Intravenous PRN Magrinat, Virgie Dad, MD         OBJECTIVE:  Vitals:   08/26/19 1209  BP: (!) 141/90  Pulse: 62  Resp: 17  Temp: 98 F (36.7 C)  SpO2: 100%   Wt Readings from Last 3 Encounters:  08/26/19 144 lb 14.4 oz (65.7 kg)  08/12/19 150 lb 3.2 oz (68.1 kg)  07/29/19 150 lb 9.6 oz (68.3 kg)   Body mass index is 24.49 kg/m.    ECOG FS:1 - Symptomatic but completely ambulatory GENERAL: Patient is a well appearing male in no acute distress HEENT:  Sclerae anicteric.  Mask in place Neck is supple.  NODES:  No cervical, supraclavicular, or axillary lymphadenopathy palpated.  LUNGS:  Clear to auscultation bilaterally.  No wheezes or rhonchi. HEART:  Regular rate and rhythm. No murmur appreciated. ABDOMEN:  Soft, nontender.  Positive, normoactive bowel sounds. No organomegaly palpated. MSK:  No focal spinal tenderness to palpation. EXTREMITIES:  No peripheral edema.   SKIN:  Clear with no obvious rashes or skin changes. No nail  dyscrasia. NEURO:  Nonfocal. Well oriented.  Appropriate affect.     LAB RESULTS:  CMP     Component Value Date/Time   NA 141 08/26/2019 1154   K 3.9 08/26/2019 1154   CL 105 08/26/2019 1154   CO2 28 08/26/2019 1154  GLUCOSE 101 (H) 08/26/2019 1154   GLUCOSE 105 (H) 09/13/2006 0900   BUN 12 08/26/2019 1154   CREATININE 0.98 08/26/2019 1154   CREATININE 1.02 10/16/2018 0936   CALCIUM 8.6 (L) 08/26/2019 1154   PROT 6.5 08/26/2019 1154   ALBUMIN 3.6 08/26/2019 1154   AST 33 08/26/2019 1154   AST 76 (H) 10/16/2018 0936   ALT 29 08/26/2019 1154   ALT 47 (H) 10/16/2018 0936   ALKPHOS 122 08/26/2019 1154   BILITOT 0.6 08/26/2019 1154   BILITOT 0.7 10/16/2018 0936   GFRNONAA >60 08/26/2019 1154   GFRNONAA >60 10/16/2018 0936   GFRAA >60 08/26/2019 1154   GFRAA >60 10/16/2018 0936    No results found for: TOTALPROTELP, ALBUMINELP, A1GS, A2GS, BETS, BETA2SER, GAMS, MSPIKE, SPEI  No results found for: KPAFRELGTCHN, LAMBDASER, KAPLAMBRATIO  Lab Results  Component Value Date   WBC 3.2 (L) 08/26/2019   NEUTROABS 1.6 (L) 08/26/2019   HGB 9.3 (L) 08/26/2019   HCT 30.9 (L) 08/26/2019   MCV 84.0 08/26/2019   PLT 166 08/26/2019    No results found for: LABCA2  No components found for: ASNKNL976  No results for input(s): INR in the last 168 hours.  No results found for: LABCA2  Lab Results  Component Value Date   CAN199 73 (H) 08/12/2019    No results found for: BHA193  No results found for: XTK240  No results found for: CA2729  No components found for: HGQUANT  Lab Results  Component Value Date   CEA1 73.86 (H) 08/12/2019   /  CEA ([facility])  Date Value Ref Range Status  08/12/2019 73.86 (H) 0.00 - 5.00 ng/mL Final    Comment:    (NOTE) This test was performed using Architect's Chemiluminescent Microparticle Immunoassay. Values obtained from different assay methods cannot be used interchangeably. Please note that 5-10% of patients who smoke may  see CEA levels up to 6.9 ng/mL. Performed at [facility], [place] [address], [place], [place] [address]      No results found for: AFPTUMOR  No results found for: John Day  No results found for: PSA1  Appointment on 08/26/2019  Component Date Value Ref Range Status  . Sodium 08/26/2019 141  135 - 145 mmol/L Final  . Potassium 08/26/2019 3.9  3.5 - 5.1 mmol/L Final  . Chloride 08/26/2019 105  98 - 111 mmol/L Final  . CO2 08/26/2019 28  22 - 32 mmol/L Final  . Glucose, Bld 08/26/2019 101* 70 - 99 mg/dL Final  . BUN 08/26/2019 12  8 - 23 mg/dL Final  . Creatinine, Ser 08/26/2019 0.98  0.61 - 1.24 mg/dL Final  . Calcium 08/26/2019 8.6* 8.9 - 10.3 mg/dL Final  . Total Protein 08/26/2019 6.5  6.5 - 8.1 g/dL Final  . Albumin 08/26/2019 3.6  3.5 - 5.0 g/dL Final  . AST 08/26/2019 33  15 - 41 U/L Final  . ALT 08/26/2019 29  0 - 44 U/L Final  . Alkaline Phosphatase 08/26/2019 122  38 - 126 U/L Final  . Total Bilirubin 08/26/2019 0.6  0.3 - 1.2 mg/dL Final  . GFR calc non Af Amer 08/26/2019 >60  >60 mL/min Final  . GFR calc Af Amer 08/26/2019 >60  >60 mL/min Final  . Anion gap 08/26/2019 8  5 - 15 Final   Performed at [facility], [place] [address], [place], [place] [address]  . WBC 08/26/2019 3.2* 4.0 - 10.5 K/uL Final  . RBC  08/26/2019 3.68* 4.22 - 5.81 MIL/uL Final  . Hemoglobin 08/26/2019 9.3* 13.0 - 17.0 g/dL Final  . HCT 08/26/2019 30.9* 39.0 - 52.0 % Final  . MCV 08/26/2019 84.0  80.0 - 100.0 fL Final  . MCH 08/26/2019 25.3* 26.0 - 34.0 pg Final  . MCHC 08/26/2019 30.1  30.0 - 36.0 g/dL Final  . RDW 08/26/2019 20.7* 11.5 - 15.5 % Final  . Platelets 08/26/2019 166  150 - 400 K/uL Final  . nRBC 08/26/2019 0.0  0.0 - 0.2 % Final  . Neutrophils Relative % 08/26/2019 50  % Final  . Neutro Abs 08/26/2019 1.6* 1.7 - 7.7 K/uL Final  . Lymphocytes Relative 08/26/2019 33  % Final  . Lymphs Abs 08/26/2019 1.1  0.7 - 4.0 K/uL Final  .  Monocytes Relative 08/26/2019 14  % Final  . Monocytes Absolute 08/26/2019 0.5  0.1 - 1.0 K/uL Final  . Eosinophils Relative 08/26/2019 2  % Final  . Eosinophils Absolute 08/26/2019 0.1  0.0 - 0.5 K/uL Final  . Basophils Relative 08/26/2019 1  % Final  . Basophils Absolute 08/26/2019 0.0  0.0 - 0.1 K/uL Final  . Immature Granulocytes 08/26/2019 0  % Final  . Abs Immature Granulocytes 08/26/2019 0.01  0.00 - 0.07 K/uL Final   Performed at [facility], [place] [address], [place], [place] [address]    (this displays the last labs from the last 3 days)  No results found for: TOTALPROTELP, ALBUMINELP, A1GS, A2GS, BETS, BETA2SER, GAMS, MSPIKE, SPEI (this displays SPEP labs)  No results found for: KPAFRELGTCHN, LAMBDASER, KAPLAMBRATIO (kappa/lambda light chains)  No results found for: HGBA, HGBA2QUANT, HGBFQUANT, HGBSQUAN (Hemoglobinopathy evaluation)   No results found for: LDH  Lab Results  Component Value Date   IRON 38 (L) 09/21/2018   TIBC 251 09/21/2018   IRONPCTSAT 15 (L) 09/21/2018   (Iron and TIBC)  Lab Results  Component Value Date   FERRITIN 138 09/21/2018    Urinalysis    Component Value Date/Time   COLORURINE YELLOW 02/18/2019 1056   APPEARANCEUR CLEAR 02/18/2019 1056   LABSPEC 1.018 02/18/2019 1056   PHURINE 6.0 02/18/2019 1056   GLUCOSEU NEGATIVE 02/18/2019 1056   GLUCOSEU NEGATIVE 07/20/2017 1056   HGBUR NEGATIVE 02/18/2019 1056   [place] 02/18/2019 1056   [place] 02/18/2019 1056   PROTEINUR 30 (A) 02/18/2019 1056   UROBILINOGEN 0.2 07/20/2017 1056   NITRITE NEGATIVE 02/18/2019 [place] 02/18/2019 1056     STUDIES:  ECHOCARDIOGRAM COMPLETE  Result Date: 08/25/2019   ECHOCARDIOGRAM REPORT   Patient Name:   Peter Wood Date of Exam: 08/25/2019 Medical Rec #:  [id number]        Height:       64.5 in Accession #:    [id number]       Weight:       150.2 lb Date of Birth:  01/14/1935        BSA:          1.74 m Patient Age:    86 years         BP:           132/72 mmHg Patient Gender: M                HR:           59 bpm. Exam Location:  Outpatient Procedure: 2D Echo Indications:    chemo evaluation  History:  Patient has prior history of Echocardiogram examinations, most                 recent 05/22/2019. Risk Factors:Hypertension.  Sonographer:    Raquel Sarna Senior Referring Phys: [place]  1. Left ventricular ejection fraction, by visual estimation, is 60 to 65%. The left ventricle has normal function. There is mildly increased left ventricular hypertrophy.  2. Left ventricular diastolic parameters are consistent with Grade I diastolic dysfunction (impaired relaxation).  3. The left ventricle has no regional wall motion abnormalities.  4. Global right ventricle has normal systolic function.The right ventricular size is normal. No increase in right ventricular wall thickness.  5. Left atrial size was normal.  6. Right atrial size was normal.  7. Trivial pericardial effusion is present.  8. The mitral valve is normal in structure. Trivial mitral valve regurgitation. No evidence of mitral stenosis.  9. The tricuspid valve is normal in structure. Tricuspid valve regurgitation is mild. 10. The aortic valve is normal in structure. Aortic valve regurgitation is not visualized. No evidence of aortic valve sclerosis or stenosis. 11. The pulmonic valve was normal in structure. Pulmonic valve regurgitation is not visualized. 12. Mildly elevated pulmonary artery systolic pressure. 13. The tricuspid regurgitant velocity is 2.59 m/s, and with an assumed right atrial pressure of 10 mmHg, the estimated right ventricular systolic pressure is mildly elevated at 36.8 mmHg. 14. The inferior vena cava is normal in size with greater than 50% respiratory variability, suggesting right atrial pressure of 3 mmHg. 15. The average left ventricular global longitudinal strain is -19.0 %.  FINDINGS  Left Ventricle: Left ventricular ejection fraction, by visual estimation, is 60 to 65%. The left ventricle has normal function. The average left ventricular global longitudinal strain is -19.0 %. The left ventricle has no regional wall motion abnormalities. There is mildly increased left ventricular hypertrophy. Concentric left ventricular hypertrophy. Left ventricular diastolic parameters are consistent with Grade I diastolic dysfunction (impaired relaxation). Normal left atrial pressure. Right Ventricle: The right ventricular size is normal. No increase in right ventricular wall thickness. Global RV systolic function is has normal systolic function. The tricuspid regurgitant velocity is 2.59 m/s, and with an assumed right atrial pressure  of 10 mmHg, the estimated right ventricular systolic pressure is mildly elevated at 36.8 mmHg. Left Atrium: Left atrial size was normal in size. Right Atrium: Right atrial size was normal in size Pericardium: Trivial pericardial effusion is present. Mitral Valve: The mitral valve is normal in structure. Trivial mitral valve regurgitation. No evidence of mitral valve stenosis by observation. Tricuspid Valve: The tricuspid valve is normal in structure. Tricuspid valve regurgitation is mild. Aortic Valve: The aortic valve is normal in structure.. There is moderate thickening of the aortic valve. Aortic valve regurgitation is not visualized. The aortic valve is structurally normal, with no evidence of sclerosis or stenosis. Moderate aortic valve annular calcification. There is moderate thickening of the aortic valve. Pulmonic Valve: The pulmonic valve was normal in structure. Pulmonic valve regurgitation is not visualized. Pulmonic regurgitation is not visualized. Aorta: The aortic root, ascending aorta and aortic arch are all structurally normal, with no evidence of dilitation or obstruction. Venous: The inferior vena cava is normal in size with greater than 50%  respiratory variability, suggesting right atrial pressure of 3 mmHg. IAS/Shunts: No atrial level shunt detected by color flow Doppler. There is no evidence of a patent foramen ovale. No ventricular septal defect is seen or detected. There is no evidence of  an atrial septal defect.  LEFT VENTRICLE PLAX 2D LVIDd:         4.90 cm  Diastology LVIDs:         3.40 cm  LV e' lateral:   12.00 cm/s LV PW:         1.00 cm  LV E/e' lateral: 3.7 LV IVS:        1.10 cm  LV e' medial:    7.29 cm/s LVOT diam:     1.90 cm  LV E/e' medial:  6.0 LV SV:         65 ml LV SV Index:   36.87    2D Longitudinal Strain LVOT Area:     2.84 cm 2D Strain GLS Avg:     -19.0 %                          3D Volume EF:                         3D EF:        60 %                         LV EDV:       187 ml                         LV ESV:       76 ml                         LV SV:        111 ml RIGHT VENTRICLE RV S prime:     16.50 cm/s TAPSE (M-mode): 2.4 cm LEFT ATRIUM             Index       RIGHT ATRIUM           Index LA diam:        3.50 cm 2.01 cm/m  RA Area:     19.30 cm LA Vol (A2C):   59.3 ml 34.05 ml/m RA Volume:   57.30 ml  32.90 ml/m LA Vol (A4C):   53.6 ml 30.78 ml/m LA Biplane Vol: 57.5 ml 33.02 ml/m  AORTIC VALVE LVOT Vmax:   124.00 cm/s LVOT Vmean:  75.500 cm/s LVOT VTI:    0.211 m  AORTA Ao Root diam: 2.70 cm Ao Asc diam:  3.20 cm MITRAL VALVE                        TRICUSPID VALVE MV Area (PHT): 2.03 cm             TR Peak grad:   26.8 mmHg MV PHT:        108.17 msec          TR Vmax:        259.00 cm/s MV Decel Time: 373 msec MV E velocity: 44.10 cm/s 103 cm/s  SHUNTS MV A velocity: 52.30 cm/s 70.3 cm/s Systemic VTI:  0.21 m MV E/A ratio:  0.84       1.5       Systemic Diam: 1.90 cm  Ena Dawley MD Electronically signed by Ena Dawley MD Signature Date/Time: 08/25/2019/10:55:32 PM    Final      ELIGIBLE FOR AVAILABLE RESEARCH PROTOCOL: no  ASSESSMENT: 84 y.o. [place], [place] man status post gastric biopsy  09/20/2018 showing adenocarcinoma, with a CA 19-9 greater than 65,000; staging studies showing an apparent mass adjacent to the head of the pancreas, innumerable liver lesions, upper abdominal mesenteric and peripancreatic lymphadenopathy, and an isolated sclerotic density in the left iliac bone  (a) genomics requested on gastric biopsy showed the tumor to be HER-2 amplified at 3+, PD-L1 positive, and T p53 mutated.  MSI is stable, mismatch repair status is proficient and the tumor mutational burden is intermediate.  (1) chest CT scan 09/19/2018 shows right lower lobe pulmonary embolism  (a) on intravenous heparin started 09/19/2018, transitioned to apixaban 10/01/2018  (2) genetics testing 10/04/2018: negative  (a) family history of prostate and early breast cancer; patient has a history of prostate cancer  (3) started gemcitabine/Abraxane 10/09/2018, repeated days 1, 8 and 15 of each 28-day cycle  (a) stopped after 1 cycle (3 doses) with reassessment of diagnosis based on CARIS results, noted above  (4) started oxaliplatin, capecitabine and trastuzumab 11/06/2018  (a) baseline echocardiogram 09/20/2018 shows an ejection fraction in the 55-60% range  (b) oxaliplatin and trastuzumab every 21 days started 11/06/2018  (c) capecitabine dose 1000 mg twice daily for 14 days of every 21-day cycle  (d) CT of the abdomen and pelvis 02/11/2019 shows marked improvement in his liver lesions and resolution of periportal adenopathy  (e) tumor markers also show significant response,  (f) echocardiogram 02/12/2019 shows an ejection fraction in the 60-65% range  (g) CT abd/pelvis show stable/ improved disease, tumor markers nearly normal  (h) oxaliplatin stopped aftwer 04/30/2019 dose  (i) capecitabine stopped after September cycle   (5) trastuzumab maintenance started 05/20/2019  (6) disease progression documented by lab work and CT scan 07/16/2019  (7) started FOLFOX 07/29/2019, continuing trastuzumab  (every 14 days).  (a) Plan is to follow markers and after total of 4 cycles restaging   PLAN: Deyton continues to tolerate his treatment with FOLFOX/Trastzumab every 2 weeks moderately well.  His labs remain stable and Peter Wood will proceed with treatment today.  Peter Wood has no clinical signs of cancer progression today.  His tumor markers had elevated since his last treatment, and we will check this again with his next cycle.  Peter Wood will be due for restaging after his next cycle of treatment. I placed orders for this today.    [place] did have some appetite decrease and weight loss.  Peter Wood is down 6 pounds.  I encouraged continue PO intake, to drink ensure or boost in place of meals if Peter Wood is unable to eat meals.  I also requested that nutrition see him to discuss further.  We looked up the Soursop drink, which is made from soursop juice, from the [place] fruit.  In reading about this fruit on [facility] About Herbs it is not recommended for patients who are on anti hypertensives as it can interfere with their blood pressure medication.  I told Amarrion that I wouldn't recommend it.    We will see Detavious back later this week to have his pump discontinued.  We will see him back in 2 weeks for labs, f/u, and his next treatment.    Peter Wood was recommended to continue with the appropriate pandemic precautions. Peter Wood knows to call for any questions that may arise between now and his next appointment.  We are happy to see him sooner if needed.  A total of (20) minutes of face-to-face time was spent with this patient with greater than 50%  of that time in counseling and care-coordination.   Wilber Bihari, NP  08/26/19 12:45 PM Medical Oncology and Hematology [facility] [place], [place] [address] Tel. [phone]    Fax. [phone]

## 2019-08-28 ENCOUNTER — Inpatient Hospital Stay: Payer: Medicare Other

## 2019-08-28 ENCOUNTER — Inpatient Hospital Stay: Payer: Medicare Other | Admitting: Nutrition

## 2019-08-28 ENCOUNTER — Other Ambulatory Visit: Payer: Self-pay

## 2019-08-28 VITALS — BP 135/82 | HR 66 | Temp 98.1°F | Resp 18

## 2019-08-28 DIAGNOSIS — C169 Malignant neoplasm of stomach, unspecified: Secondary | ICD-10-CM

## 2019-08-28 DIAGNOSIS — Z95828 Presence of other vascular implants and grafts: Secondary | ICD-10-CM

## 2019-08-28 DIAGNOSIS — C787 Secondary malignant neoplasm of liver and intrahepatic bile duct: Secondary | ICD-10-CM

## 2019-08-28 DIAGNOSIS — C168 Malignant neoplasm of overlapping sites of stomach: Secondary | ICD-10-CM

## 2019-08-28 DIAGNOSIS — Z5111 Encounter for antineoplastic chemotherapy: Secondary | ICD-10-CM | POA: Diagnosis not present

## 2019-08-28 DIAGNOSIS — C799 Secondary malignant neoplasm of unspecified site: Secondary | ICD-10-CM

## 2019-08-28 MED ORDER — SODIUM CHLORIDE 0.9% FLUSH
10.0000 mL | INTRAVENOUS | Status: DC | PRN
  Administered 2019-08-28: 10 mL
  Filled 2019-08-28: qty 10

## 2019-08-28 MED ORDER — HEPARIN SOD (PORK) LOCK FLUSH 100 UNIT/ML IV SOLN
500.0000 [IU] | Freq: Once | INTRAVENOUS | Status: AC | PRN
  Administered 2019-08-28: 500 [IU]
  Filled 2019-08-28: qty 5

## 2019-08-28 NOTE — Progress Notes (Signed)
Nutrition follow up completed with patient over the telephone. He is receiving treatment for metastatic gastric adenocarcinoma. Patient reports nausea is controlled with medication. He experienced cold sensitivity after oxaliplatin. Denies problems moving his bowels. Labs reviewed and noted hemoglobin 9.1, hematocrit 29.3. States he was told to eat more high iron foods. He will drink Boost occasionally. Weight was documented as 144 pounds December 15, down from 150 pounds November 17.  Patient states it is because he had a poor appetite x2 days.  He now states he is eating much better.  Nutrition Diagnosis: Weight loss continues.  Interventions: Educated patient to continue a high protein, high calorie diet to minimize weight loss. Educated patient on foods high in iron. Provided coupons for boost. Provided fact sheets and contact information.  Questions were answered and teach back method used.  Monitoring, evaluation, goals: Patient will tolerate increased calories and protein to minimize weight loss throughout treatment.  Next visit: Tuesday, December 29 during infusion.  **Disclaimer: This note was dictated with voice recognition software. Similar sounding words can inadvertently be transcribed and this note may contain transcription errors which may not have been corrected upon publication of note.**

## 2019-09-08 ENCOUNTER — Other Ambulatory Visit: Payer: Self-pay

## 2019-09-08 ENCOUNTER — Ambulatory Visit (HOSPITAL_COMMUNITY)
Admission: RE | Admit: 2019-09-08 | Discharge: 2019-09-08 | Disposition: A | Discharge status: Home / Self Care | Payer: Medicare Other | Source: Ambulatory Visit | Attending: Adult Health | Admitting: Adult Health

## 2019-09-08 DIAGNOSIS — C162 Malignant neoplasm of body of stomach: Secondary | ICD-10-CM | POA: Diagnosis present

## 2019-09-08 DIAGNOSIS — C787 Secondary malignant neoplasm of liver and intrahepatic bile duct: Secondary | ICD-10-CM | POA: Diagnosis present

## 2019-09-08 MED ORDER — SODIUM CHLORIDE (PF) 0.9 % IJ SOLN
INTRAMUSCULAR | Status: AC
  Filled 2019-09-08: qty 50

## 2019-09-08 MED ORDER — HEPARIN SOD (PORK) LOCK FLUSH 100 UNIT/ML IV SOLN
INTRAVENOUS | Status: AC
  Filled 2019-09-08: qty 5

## 2019-09-08 MED ORDER — IOHEXOL 300 MG/ML  SOLN
100.0000 mL | Freq: Once | INTRAMUSCULAR | Status: AC | PRN
  Administered 2019-09-08: 09:00:00 100 mL via INTRAVENOUS

## 2019-09-08 MED ORDER — HEPARIN SOD (PORK) LOCK FLUSH 100 UNIT/ML IV SOLN
500.0000 [IU] | Freq: Once | INTRAVENOUS | Status: AC
  Administered 2019-09-08: 500 [IU] via INTRAVENOUS

## 2019-09-08 NOTE — Progress Notes (Signed)
[place]  [phone]) [phone] Fax:(336) [phone]     ID: [name] DOB: [birth year]  MR#: [id number]  BLT#:[id number]  Patient Care Team: Cassandria Anger, MD as PCP - General Milus Banister, MD as Attending Physician (Gastroenterology) Sohan Potvin, Virgie Dad, MD as Consulting Physician (Oncology) Gatha Mayer, MD as Consulting Physician (Gastroenterology) [name], MD as Consulting Physician (Ophthalmology) Ramon Dredge, MD as Referring Physician (Radiation Oncology) Larey Dresser, MD as Consulting Physician (Cardiology) OTHER MD:   CHIEF COMPLAINT: Metastatic gastric adenocarcinoma  CURRENT TREATMENT: To start pembrolizumab, Enhertu   INTERVAL HISTORY: Peter Wood returns today for follow-up 84 and treatment of his metastatic gastric adenocarcinoma.   He was restarted on FOLFOX due to progression on anti-HER-2 treatment only.  He started treatment on 07/29/2019 to be given on day 1 of a 14 day cycle.  Today is cycle 4 day 1 of treatment. 84    He continues on trastuzumab with good tolerance. He receives this every 2 weeks.    His most recent echocardiogram on 08/25/2019 showed an ejection fraction of 60-65%.  He has a new patient appointment with Dr. Haroldine Laws on 09/22/2019.    Unfortunately since his last visit, he underwent restaging abdomen/pelvis CT yesterday, 09/08/2019, which revealed: enlarging and increasingly necrotic hepatic metastases; grossly stable probable distal gastric mass and adjacent adenopathy; stable small pericardial effusion.   REVIEW OF SYSTEMS: Micha continues to feel and generally do well.  He has a good appetite, no nausea or vomiting, and no pain.  Christmas was quiet just him and his wife and they are taking appropriate pandemic precautions.  He has tolerated treatment well.  He has had no bleeding problems from the Eliquis.  A detailed review of systems today was otherwise stable.   HISTORY OF CURRENT  ILLNESS: From the original intake note:  Peter Wood presented to the emergency room 09/07/2018 with mid/upper abdominal pain.  He reported weight loss of 25 pounds over the past 6 months and significant anorexia.  His liver function tests were abnormal and a right upper quadrant ultrasound was obtained.  This was read as concerning for malignancy and a CT of the abdomen and pelvis with contrast was obtained the same day, showing  1. Too numerous to count hypodense masses of the liver concerning for hepatic metastasis. In the region of the porta hepatis, there are hypodense lesions more likely associated with the liver though the pancreatic head is partially obscured by a 3.2 cm hypodense mass. Findings are more likely associated with the liver and less likely an isolated pancreatic mass. 2. Upper abdominal mesenteric and peripancreatic lymphadenopathy suspicious for metastatic disease. 3. 6 mm sclerotic density in the left iliac bone adjacent to the SI joint. Given history of prostate cancer, differential considerations would include osteoblastic metastasis or possibly bone island. Showed only diverticular disease.  He was referred to GI and Dr. Ardis Hughs notes a colonoscopy performed 04/23/2017 makes the possibility of colon cancer unlikely.  Further work-up was planned, but on 09/19/2018 the patient again presented to the emergency room with chest pain, now in a different area, and a staging CT scan of the chest showed filling defects in the right lower lobe pulmonary arteries consistent with embolism.  There was a small pericardial effusion and an additional 2.4 cm left thyroid mass.  Aortic atherosclerosis was noted  He was started on intravenous heparin and on 09/20/2018 underwent upper endoscopy under Dr. Carlean Purl showing a giant nonbleeding cratered gastric ulcer in  the posterior wall of the stomach.  Biopsies of this procedure showed (SZA 20-187) adenocarcinoma.   At that point we were  consulted and we obtained tumor markers which included a CEA of 1318.2, a CA 19-9 of 67,479, a normal AFP and a normal PSA.  I discussed the case with pathology and they do not feel that additional testing would be helpful in terms of discriminating between a primary gastric and a primary biliary/pancreatic tumor.  The patient's subsequent history is as detailed below.   PAST MEDICAL HISTORY: Past Medical History:  Diagnosis Date  . Arthritis   . Diverticulosis of colon   . Family history of breast cancer   . Family history of prostate cancer   . HTN (hypertension)   . Hx of colonic polyp   . Pneumonia    as a child/baby  . Poor circulation of extremity    right leg  . Prostate cancer ([place]) 2015   prostate cancer - radiation treated     PAST SURGICAL HISTORY: Past Surgical History:  Procedure Laterality Date  . [place] VITRECTOMY WITH 20 GAUGE MVR PORT FOR MACULAR HOLE Right 09/19/2016   Procedure: 25 GAUGE PARS PLANA VITRECTOMY WITH 20 GAUGE MVR PORT FOR MACULAR HOLE, MEMBRANE PEEL, SERUM PATCH, GAS FLUID EXCHANGE, HEADSCOPE LASER;  Surgeon: [name], MD;  Location: [name];  Service: Ophthalmology;  Laterality: Right;  . BIOPSY  09/20/2018   Procedure: BIOPSY;  Surgeon: Gatha Mayer, MD;  Location: [facility] ENDOSCOPY;  Service: Endoscopy;;  . CATARACT EXTRACTION  08/27/12   Right  . COLONOSCOPY    . ESOPHAGOGASTRODUODENOSCOPY (EGD) WITH PROPOFOL N/A 09/20/2018   Procedure: ESOPHAGOGASTRODUODENOSCOPY (EGD) WITH PROPOFOL;  Surgeon: Gatha Mayer, MD;  Location: [facility];  Service: Endoscopy;  Laterality: N/A;  . HAND SURGERY  2016   right thumb  . IR IMAGING GUIDED PORT INSERTION  11/04/2018  . KNEE SURGERY Left 1989     FAMILY HISTORY: Family History  Problem Relation Age of Onset  . Prostate cancer Father        dx >50  . Hypertension Mother   . Ulcers Mother   . Breast cancer Sister        dx 20's  . Prostate cancer Brother        dx under 62,  metasttic, cause of his death  . Ulcers Brother   . Ulcers Sister    Tjay's father died from heart complications with a pacemaker at age 35; Dann's father also has prostate cancer. Patients' mother died from natural causes at age 54. The patient has 4 brothers and 3 sisters. One of [place] sisters had breast cancer in her 59s and a brother had prostate cancer. Patient denies anyone in her family having ovarian, or pancreatic cancer. Khalin mentions that his youngest sister and his brother with prostate cancer also had stomach ulcers.    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 His wife, [name], is a retired [employer] summer Radiographer, therapeutic. As of 09/2018, they have been married for 56 years. They have 4 children, Claiborne Billings, [place], [place], and Joshua. Claiborne Billings lives in [place] and works at the [employer]. Sherrod lives in [place] and works at [employer] and as a part Horticulturist, commercial. Germane lives in [place] and is a Engineer, drilling for a medical company. Beverely Low lives in [place] and works at [employer]. Rui has 7 grandchildren and 1 step great grandchild. He attends [employer].  ADVANCED DIRECTIVES: His wife, [name], is automatically his healthcare power of attorney.     HEALTH MAINTENANCE: Social History   Tobacco Use  . Smoking status: Former Smoker    Years: 4.00    Quit date: 09/12/1967    Years since quitting: 52.0  . Smokeless tobacco: Never Used  Substance Use Topics  . Alcohol use: No  . Drug use: No    Colonoscopy: 2018/[facility]  PSA: 0.54 on 09/18/2018  No Known Allergies  Current Outpatient Medications  Medication Sig Dispense Refill  . amLODipine (NORVASC) 5 MG tablet TAKE ONE TABLET BY MOUTH ONE TIME DAILY  90 tablet 2  . apixaban (ELIQUIS) 2.5 MG TABS tablet Take 1 tablet (2.5 mg total) by mouth 2 (two) times daily. 60 tablet 11  . benazepril (LOTENSIN) 40 MG tablet TAKE ONE TABLET BY MOUTH ONE TIME DAILY  90 tablet 2  .  Cholecalciferol 1000 UNITS tablet Take 1,000 Units by mouth daily.     Marland Kitchen latanoprost (XALATAN) 0.005 % ophthalmic solution Place 1 drop into both eyes at bedtime.     . lidocaine-prilocaine (EMLA) cream Apply to affected area once 30 g 3  . ondansetron (ZOFRAN) 4 MG tablet Take 1 tablet (4 mg total) by mouth every 8 (eight) hours as needed for nausea or vomiting. 20 tablet 1   No current facility-administered medications for this visit.   Facility-Administered Medications Ordered in Other Visits  Medication Dose Route Frequency Provider Last Rate Last Admin  . sodium chloride flush (NS) 0.9 % injection 10 mL  10 mL Intravenous PRN Josearmando Kuhnert, Virgie Dad, MD         OBJECTIVE: Older African-American in no acute distress Vitals:   09/09/19 1016  BP: (!) 150/69  Pulse: 63  Resp: 18  Temp: 98.3 F (36.8 C)  SpO2: 100%   Wt Readings from Last 3 Encounters:  09/09/19 147 lb 11.2 oz (67 kg)  08/26/19 144 lb 14.4 oz (65.7 kg)  08/12/19 150 lb 3.2 oz (68.1 kg)   Body mass index is 24.96 kg/m.    ECOG FS:1 - Symptomatic but completely ambulatory  Sclerae unicteric, EOMs intact Wearing a mask No cervical or supraclavicular adenopathy Lungs no rales or rhonchi Heart regular rate and rhythm Abd soft, nontender, positive bowel sounds MSK no focal spinal tenderness, no upper extremity lymphedema Neuro: nonfocal, well oriented, appropriate affect  LAB RESULTS:  CMP     Component Value Date/Time   NA 140 09/09/2019 1005   K 3.6 09/09/2019 1005   CL 104 09/09/2019 1005   CO2 27 09/09/2019 1005   GLUCOSE 112 (H) 09/09/2019 1005   GLUCOSE 105 (H) 09/13/2006 0900   BUN 11 09/09/2019 1005   CREATININE 1.09 09/09/2019 1005   CREATININE 1.02 10/16/2018 0936   CALCIUM 8.8 (L) 09/09/2019 1005   PROT 6.2 (L) 09/09/2019 1005   ALBUMIN 3.5 09/09/2019 1005   AST 34 09/09/2019 1005   AST 76 (H) 10/16/2018 0936   ALT 26 09/09/2019 1005   ALT 47 (H) 10/16/2018 0936   ALKPHOS 106 09/09/2019  1005   BILITOT 0.8 09/09/2019 1005   BILITOT 0.7 10/16/2018 0936   GFRNONAA >60 09/09/2019 1005   GFRNONAA >60 10/16/2018 0936   GFRAA >60 09/09/2019 1005   GFRAA >60 10/16/2018 0936    No results found for: TOTALPROTELP, ALBUMINELP, A1GS, A2GS, BETS, BETA2SER, GAMS, MSPIKE, SPEI  No results found for: KPAFRELGTCHN, LAMBDASER, KAPLAMBRATIO  Lab Results  Component Value Date   WBC 2.3 (  L) 09/09/2019   NEUTROABS 1.0 (L) 09/09/2019   HGB 8.9 (L) 09/09/2019   HCT 29.3 (L) 09/09/2019   MCV 82.1 09/09/2019   PLT 145 (L) 09/09/2019    No results found for: LABCA2  No components found for: AYTKZS010  No results for input(s): INR in the last 168 hours.  No results found for: LABCA2  Lab Results  Component Value Date   CAN199 73 (H) 08/12/2019    No results found for: XNA355  No results found for: DDU202  No results found for: CA2729  No components found for: HGQUANT  Lab Results  Component Value Date   CEA1 50.17 (H) 09/09/2019   /  CEA ([facility])  Date Value Ref Range Status  09/09/2019 50.17 (H) 0.00 - 5.00 ng/mL Final    Comment:    (NOTE) This test was performed using Architect's Chemiluminescent Microparticle Immunoassay. Values obtained from different assay methods cannot be used interchangeably. Please note that 5-10% of patients who smoke may see CEA levels up to 6.9 ng/mL. Performed at [facility], [place] [address], [place], [place] [address]      No results found for: AFPTUMOR  No results found for: CHROMOGRNA  No results found for: HGBA, HGBA2QUANT, HGBFQUANT, HGBSQUAN (Hemoglobinopathy evaluation)   No results found for: LDH  Lab Results  Component Value Date   IRON 38 (L) 09/21/2018   TIBC 251 09/21/2018   IRONPCTSAT 15 (L) 09/21/2018   (Iron and TIBC)  Lab Results  Component Value Date   FERRITIN 138 09/21/2018    Urinalysis    Component Value Date/Time   COLORURINE YELLOW 02/18/2019 1056    APPEARANCEUR CLEAR 02/18/2019 1056   LABSPEC 1.018 02/18/2019 1056   PHURINE 6.0 02/18/2019 1056   GLUCOSEU NEGATIVE 02/18/2019 [place] 07/20/2017 1056   HGBUR NEGATIVE 02/18/2019 [place] 02/18/2019 [place] 02/18/2019 1056   PROTEINUR 30 (A) 02/18/2019 1056   UROBILINOGEN 0.2 07/20/2017 1056   NITRITE NEGATIVE 02/18/2019 [place] 02/18/2019 1056     STUDIES:  CT Abdomen Pelvis W Contrast  Result Date: 09/08/2019 CLINICAL DATA:  Gastrointestinal cancer, staging. Prostate cancer metastatic to stomach and liver. Ongoing chemotherapy. EXAM: CT ABDOMEN AND PELVIS WITH CONTRAST TECHNIQUE: Multidetector CT imaging of the abdomen and pelvis was performed using the standard protocol following bolus administration of intravenous contrast. CONTRAST:  163m OMNIPAQUE IOHEXOL 300 MG/ML  SOLN COMPARISON:  07/16/2019 and 09/07/2018. FINDINGS: Lower chest: Scarring in the lung bases. Nodular density in the subpleural medial right lower lobe measures 4 mm (4/3), unchanged from 09/07/2018. Heart is at the upper limits of normal in size. Small pericardial effusion, stable. No pleural fluid. Hepatobiliary: Heterogeneous nodules and masses in the liver are somewhat difficult to measure. Index mass in the right hepatic lobe measures 3.8 x 4.2 cm (2/9), increased from 3.2 x 4.2 cm on 07/16/2019 and is increasingly necrotic centrally. Index lesion in the left hepatic lobe (segment 4B) has enlarged as well, now measuring 3.0 x 3.2 cm (2/18), compared to 1.5 x 2.1 cm on 07/16/2019. This lesion also appears more centrally necrotic. Gallbladder is unremarkable. No biliary ductal dilatation. Pancreas: Negative. Spleen: Negative. Adrenals/Urinary Tract: Adrenal glands are unremarkable. Subcentimeter low-attenuation lesions in the kidneys are too small to characterize but statistically, cysts are likely. Ureters are decompressed. Bladder is grossly  unremarkable. Stomach/Bowel: There may be wall thickening at the gastroesophageal junction as well as within  the mid and distal stomach. Suspect a discrete mass in the gastric antrum, measuring 1.4 x 2.6 cm (coronal image 13), similar. Small bowel and appendix are otherwise unremarkable. Stool is seen in the majority of the colon, indicative of constipation. Vascular/Lymphatic: Atherosclerotic calcification of the aorta. 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 Adenopathy inferior to the gastric antrum measures approximately 1.5 cm (2/26), similar. Otherwise, no pathologically enlarged lymph nodes. Reproductive: Prostate is enlarged and contains brachytherapy seeds. Other: No free fluid. Mesenteries and peritoneum are otherwise unremarkable. Musculoskeletal: No worrisome lytic or sclerotic lesions. Degenerative disc disease and disc space narrowing at L3-4 and L5-S1. IMPRESSION: 1. Enlarging and increasingly necrotic hepatic metastases. 2. Probable distal gastric mass and adjacent adenopathy, grossly stable. 3. Small pericardial effusion, stable. 4. Enlarged prostate. 5. Aortic atherosclerosis (ICD10-170.0). 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 Electronically Signed   By: Lorin Picket M.D.   On: 09/08/2019 10:01   ECHOCARDIOGRAM COMPLETE  Result Date: 08/25/2019   ECHOCARDIOGRAM REPORT   Patient Name:   Peter Wood Date of Exam: 08/25/2019 Medical Rec #:  [id number]        Height:       64.5 in Accession #:    [id number]       Weight:       150.2 lb Date of Birth:  24-Apr-1935       BSA:          1.74 m Patient Age:    19 years         BP:           132/72 mmHg Patient Gender: M                HR:           59 bpm. Exam Location:  Outpatient Procedure: 2D Echo Indications:    chemo evaluation  History:        Patient has prior history of Echocardiogram examinations, most                 recent 05/22/2019. Risk Factors:Hypertension.   Sonographer:    Raquel Sarna Senior Referring Phys: [place]  1. Left ventricular ejection fraction, by visual estimation, is 60 to 65%. The left ventricle has normal function. There is mildly increased left ventricular hypertrophy.  2. Left ventricular diastolic parameters are consistent with Grade I diastolic dysfunction (impaired relaxation).  3. The left ventricle has no regional wall motion abnormalities.  4. Global right ventricle has normal systolic function.The right ventricular size is normal. No increase in right ventricular wall thickness.  5. Left atrial size was normal.  6. Right atrial size was normal.  7. Trivial pericardial effusion is present.  8. The mitral valve is normal in structure. Trivial mitral valve regurgitation. No evidence of mitral stenosis.  9. The tricuspid valve is normal in structure. Tricuspid valve regurgitation is mild. 10. The aortic valve is normal in structure. Aortic valve regurgitation is not visualized. No evidence of aortic valve sclerosis or stenosis. 11. The pulmonic valve was normal in structure. Pulmonic valve regurgitation is not visualized. 12. Mildly elevated pulmonary artery systolic pressure. 13. The tricuspid regurgitant velocity is 2.59 m/s, and with an assumed right atrial pressure of 10 mmHg, the estimated right ventricular systolic pressure is mildly elevated at 36.8 mmHg. 14. The inferior vena cava is normal in size with greater than 50% respiratory variability, suggesting right atrial pressure of 3 mmHg.  15. The average left ventricular global longitudinal strain is -19.0 %. FINDINGS  Left Ventricle: Left ventricular ejection fraction, by visual estimation, is 60 to 65%. The left ventricle has normal function. The average left ventricular global longitudinal strain is -19.0 %. The left ventricle has no regional wall motion abnormalities. There is mildly increased left ventricular hypertrophy. Concentric left ventricular hypertrophy.  Left ventricular diastolic parameters are consistent with Grade I diastolic dysfunction (impaired relaxation). Normal left atrial pressure. Right Ventricle: The right ventricular size is normal. No increase in right ventricular wall thickness. Global RV systolic function is has normal systolic function. The tricuspid regurgitant velocity is 2.59 m/s, and with an assumed right atrial pressure  of 10 mmHg, the estimated right ventricular systolic pressure is mildly elevated at 36.8 mmHg. Left Atrium: Left atrial size was normal in size. Right Atrium: Right atrial size was normal in size Pericardium: Trivial pericardial effusion is present. Mitral Valve: The mitral valve is normal in structure. Trivial mitral valve regurgitation. No evidence of mitral valve stenosis by observation. Tricuspid Valve: The tricuspid valve is normal in structure. Tricuspid valve regurgitation is mild. Aortic Valve: The aortic valve is normal in structure.. There is moderate thickening of the aortic valve. Aortic valve regurgitation is not visualized. The aortic valve is structurally normal, with no evidence of sclerosis or stenosis. Moderate aortic valve annular calcification. There is moderate thickening of the aortic valve. Pulmonic Valve: The pulmonic valve was normal in structure. Pulmonic valve regurgitation is not visualized. Pulmonic regurgitation is not visualized. Aorta: The aortic root, ascending aorta and aortic arch are all structurally normal, with no evidence of dilitation or obstruction. Venous: The inferior vena cava is normal in size with greater than 50% respiratory variability, suggesting right atrial pressure of 3 mmHg. IAS/Shunts: No atrial level shunt detected by color flow Doppler. There is no evidence of a patent foramen ovale. No ventricular septal defect is seen or detected. There is no evidence of an atrial septal defect.  LEFT VENTRICLE PLAX 2D LVIDd:         4.90 cm  Diastology LVIDs:         3.40 cm  LV e'  lateral:   12.00 cm/s LV PW:         1.00 cm  LV E/e' lateral: 3.7 LV IVS:        1.10 cm  LV e' medial:    7.29 cm/s LVOT diam:     1.90 cm  LV E/e' medial:  6.0 LV SV:         65 ml LV SV Index:   36.87    2D Longitudinal Strain LVOT Area:     2.84 cm 2D Strain GLS Avg:     -19.0 %                          3D Volume EF:                         3D EF:        60 %                         LV EDV:       187 ml                         LV ESV:  76 ml                         LV SV:        111 ml RIGHT VENTRICLE RV S prime:     16.50 cm/s TAPSE (M-mode): 2.4 cm LEFT ATRIUM             Index       RIGHT ATRIUM           Index LA diam:        3.50 cm 2.01 cm/m  RA Area:     19.30 cm LA Vol (A2C):   59.3 ml 34.05 ml/m RA Volume:   57.30 ml  32.90 ml/m LA Vol (A4C):   53.6 ml 30.78 ml/m LA Biplane Vol: 57.5 ml 33.02 ml/m  AORTIC VALVE LVOT Vmax:   124.00 cm/s LVOT Vmean:  75.500 cm/s LVOT VTI:    0.211 m  AORTA Ao Root diam: 2.70 cm Ao Asc diam:  3.20 cm MITRAL VALVE                        TRICUSPID VALVE MV Area (PHT): 2.03 cm             TR Peak grad:   26.8 mmHg MV PHT:        108.17 msec          TR Vmax:        259.00 cm/s MV Decel Time: 373 msec MV E velocity: 44.10 cm/s 103 cm/s  SHUNTS MV A velocity: 52.30 cm/s 70.3 cm/s Systemic VTI:  0.21 m MV E/A ratio:  0.84       1.5       Systemic Diam: 1.90 cm  Ena Dawley MD Electronically signed by Ena Dawley MD Signature Date/Time: 08/25/2019/10:55:32 PM    Final      ELIGIBLE FOR AVAILABLE RESEARCH PROTOCOL: no   ASSESSMENT: 84 y.o. [place], [place] man status post gastric biopsy 09/20/2018 showing adenocarcinoma, with a CA 19-9 greater than 65,000; staging studies showing an apparent mass adjacent to the head of the pancreas, innumerable liver lesions, upper abdominal mesenteric and peripancreatic lymphadenopathy, and an isolated sclerotic density in the left iliac bone  (a) genomics requested on gastric biopsy showed the tumor to be HER-2  amplified at 3+, PD-L1 positive, and T p53 mutated.  MSI is stable, mismatch repair status is proficient and the tumor mutational burden is intermediate.  (1) chest CT scan 09/19/2018 shows right lower lobe pulmonary embolism  (a) on intravenous heparin started 09/19/2018, transitioned to apixaban 10/01/2018  (2) genetics testing 10/04/2018: negative  (a) family history of prostate and early breast cancer; patient has a history of prostate cancer  (3) started gemcitabine/Abraxane 10/09/2018, repeated days 1, 8 and 15 of each 28-day cycle  (a) stopped after 1 cycle (3 doses) with reassessment of diagnosis based on CARIS results, noted above  (4) started oxaliplatin, capecitabine and trastuzumab 11/06/2018  (a) baseline echocardiogram 09/20/2018 shows an ejection fraction in the 55-60% range  (b) oxaliplatin and trastuzumab every 21 days started 11/06/2018  (c) capecitabine dose 1000 mg twice daily for 14 days of every 21-day cycle  (d) CT of the abdomen and pelvis 02/11/2019 shows marked improvement in his liver lesions and resolution of periportal adenopathy  (e) tumor markers also show significant response,  (f) echocardiogram 02/12/2019 shows an ejection fraction in the 60-65% range  (g) CT abd/pelvis show stable/  improved disease, tumor markers nearly normal  (h) oxaliplatin stopped aftwer 04/30/2019 dose  (i) capecitabine stopped after September cycle   (5) trastuzumab maintenance started 05/20/2019  (6) disease progression documented by lab work and CT scan 07/16/2019  (a) resumed FOLFOX 07/29/2019, continuing trastuzumab (every 14 days).  (b) restaging studies after 3 cycles showed evidence of progression (tumor markers equivocal)  (7) to start pembrolizumab 09/15/2018, repeat every 6 weeks  (a) plan to add Enhertu 09/30/2019   (b) echocardiogram 08/25/2019 showed an ejection fraction in the 60-65% range PLAN: Philipp tolerated FOLFOX/trastuzumab generally quite well.  His tumor  marker was trending up and then went down a bit.  However the CT scan shows evidence of growth in the liver lesions and that trumps the tumor marker, even though some of those lesions does show central necrosis.  We need to change therapy and we certainly have many options.  We could do FOLFIRI, and normally we would add a VEGF inhibitor at that point but since he is on Eliquis I would be hesitant to do that.  We could go to other chemotherapy combinations together with trastuzumab.  I think it would be worth while making use of the fact that his tumor is PD-L1 positive.  I am going to start him on pembrolizumab next week.  We will do the every 6-week dose to minimize visits.  Today we discussed the possible toxicities side effects and complications of this agent and I will start monitoring his thyroid function.  We can then add Enhertu to target the HER-2 positivity of his tumor.  We will discussed the possible toxicities side effects and complications of this agent more in detail when he returns to see me at the time of the first dose.  Note he just had an echo 08/25/2019 showing a well-preserved ejection fraction.  Mr. Ritchey has a good understanding of this plan.  He knows to call us for any problem that may develop before the next visit here.  Virgie Dad. Novaleigh Kohlman, MD 09/09/19 6:05 PM Medical Oncology and Hematology [facility] [place], [place] [address] Tel. [phone]    Fax. [phone]   I, Wilburn Mylar, am acting as scribe for Dr. Virgie Dad. Carnell Casamento.  I, Lurline Del MD, have reviewed the above documentation for accuracy and completeness, and I agree with the above.

## 2019-09-09 ENCOUNTER — Inpatient Hospital Stay: Payer: Medicare Other | Admitting: Nutrition

## 2019-09-09 ENCOUNTER — Other Ambulatory Visit: Payer: Self-pay

## 2019-09-09 ENCOUNTER — Inpatient Hospital Stay (HOSPITAL_BASED_OUTPATIENT_CLINIC_OR_DEPARTMENT_OTHER): Payer: Medicare Other | Admitting: Oncology

## 2019-09-09 ENCOUNTER — Inpatient Hospital Stay: Payer: Medicare Other

## 2019-09-09 VITALS — BP 150/69 | HR 63 | Temp 98.3°F | Resp 18 | Ht 64.5 in | Wt 147.7 lb

## 2019-09-09 DIAGNOSIS — Z7189 Other specified counseling: Secondary | ICD-10-CM

## 2019-09-09 DIAGNOSIS — Z5111 Encounter for antineoplastic chemotherapy: Secondary | ICD-10-CM | POA: Diagnosis not present

## 2019-09-09 DIAGNOSIS — C787 Secondary malignant neoplasm of liver and intrahepatic bile duct: Secondary | ICD-10-CM

## 2019-09-09 DIAGNOSIS — D649 Anemia, unspecified: Secondary | ICD-10-CM

## 2019-09-09 DIAGNOSIS — C169 Malignant neoplasm of stomach, unspecified: Secondary | ICD-10-CM

## 2019-09-09 DIAGNOSIS — C799 Secondary malignant neoplasm of unspecified site: Secondary | ICD-10-CM

## 2019-09-09 DIAGNOSIS — C168 Malignant neoplasm of overlapping sites of stomach: Secondary | ICD-10-CM

## 2019-09-09 DIAGNOSIS — C61 Malignant neoplasm of prostate: Secondary | ICD-10-CM | POA: Diagnosis not present

## 2019-09-09 DIAGNOSIS — Z95828 Presence of other vascular implants and grafts: Secondary | ICD-10-CM

## 2019-09-09 LAB — COMPREHENSIVE METABOLIC PANEL
ALT: 26 U/L (ref 0–44)
AST: 34 U/L (ref 15–41)
Albumin: 3.5 g/dL (ref 3.5–5.0)
Alkaline Phosphatase: 106 U/L (ref 38–126)
Anion gap: 9 (ref 5–15)
BUN: 11 mg/dL (ref 8–23)
CO2: 27 mmol/L (ref 22–32)
Calcium: 8.8 mg/dL — ABNORMAL LOW (ref 8.9–10.3)
Chloride: 104 mmol/L (ref 98–111)
Creatinine, Ser: 1.09 mg/dL (ref 0.61–1.24)
GFR calc Af Amer: >60 mL/min (ref >60)
GFR calc non Af Amer: >60 mL/min (ref >60)
Glucose, Bld: 112 mg/dL — ABNORMAL HIGH (ref 70–99)
Potassium: 3.6 mmol/L (ref 3.5–5.1)
Sodium: 140 mmol/L (ref 135–145)
Total Bilirubin: 0.8 mg/dL (ref 0.3–1.2)
Total Protein: 6.2 g/dL — ABNORMAL LOW (ref 6.5–8.1)

## 2019-09-09 LAB — CBC WITH DIFFERENTIAL/PLATELET
Abs Immature Granulocytes: 0.01 10*3/uL (ref 0.00–0.07)
Basophils Absolute: 0 10*3/uL (ref 0.0–0.1)
Basophils Relative: 1 %
Eosinophils Absolute: 0.1 10*3/uL (ref 0.0–0.5)
Eosinophils Relative: 3 %
HCT: 29.3 % — ABNORMAL LOW (ref 39.0–52.0)
Hemoglobin: 8.9 g/dL — ABNORMAL LOW (ref 13.0–17.0)
Immature Granulocytes: 0 %
Lymphocytes Relative: 36 %
Lymphs Abs: 0.8 10*3/uL (ref 0.7–4.0)
MCH: 24.9 pg — ABNORMAL LOW (ref 26.0–34.0)
MCHC: 30.4 g/dL (ref 30.0–36.0)
MCV: 82.1 fL (ref 80.0–100.0)
Monocytes Absolute: 0.4 10*3/uL (ref 0.1–1.0)
Monocytes Relative: 18 %
Neutro Abs: 1 10*3/uL — ABNORMAL LOW (ref 1.7–7.7)
Neutrophils Relative %: 42 %
Platelets: 145 10*3/uL — ABNORMAL LOW (ref 150–400)
RBC: 3.57 MIL/uL — ABNORMAL LOW (ref 4.22–5.81)
RDW: 21.6 % — ABNORMAL HIGH (ref 11.5–15.5)
WBC: 2.3 10*3/uL — ABNORMAL LOW (ref 4.0–10.5)
nRBC: 0 % (ref 0.0–0.2)

## 2019-09-09 LAB — CEA (IN HOUSE-CHCC): CEA (CHCC-In House): 50.17 ng/mL — ABNORMAL HIGH (ref 0.00–5.00)

## 2019-09-09 MED ORDER — SODIUM CHLORIDE 0.9% FLUSH
10.0000 mL | Freq: Once | INTRAVENOUS | Status: AC
  Administered 2019-09-09: 10:00:00 10 mL
  Filled 2019-09-09: qty 10

## 2019-09-09 NOTE — Progress Notes (Signed)
DISCONTINUE OFF PATHWAY REGIMEN - Gastroesophageal   [id number]:FOLFOX (q14d) **2 cycles per order sheet**:   A cycle is every 14 days:     Oxaliplatin      Leucovorin      Fluorouracil      Fluorouracil   **Always confirm dose/schedule in your pharmacy ordering system**  REASON: Other Reason PRIOR TREATMENT: Off Pathway: FOLFOX (q14d) **2 cycles per order sheet** TREATMENT RESPONSE: Unable to Evaluate  START OFF PATHWAY REGIMEN - Gastroesophageal   [id number]:Fam-trastuzumab deruxtecan-nxki 5.4 mg/kg IV D1 q21 Days:   A cycle is every 21 days:     Fam-trastuzumab deruxtecan-nxki   **Always confirm dose/schedule in your pharmacy ordering system**  Patient Characteristics: Distant Metastases (cM1/pM1) / Locally Recurrent Disease, Adenocarcinoma - Esophageal, GE Junction, and Gastric, Third Line and Beyond, MSS/pMMR or MSI Unknown, PD?L1 Expression  Positive(CPS ? 1) Histology: Adenocarcinoma Disease Classification: Gastric Therapeutic Status: Distant Metastases (No Additional Staging) Line of Therapy: Third Engineer, civil (consulting) Status: Unknown PD-L1 Expression Status: PD-L1 Positive (CPS ? 1) Prior Immunotherapy Status: No Prior PD-1/PD-L1 Inhibitor Intent of Therapy: Non-Curative / Palliative Intent, Discussed with Patient

## 2019-09-09 NOTE — Patient Instructions (Signed)

## 2019-09-10 LAB — CANCER ANTIGEN 19-9: CA 19-9: 50 U/mL — ABNORMAL HIGH (ref 0–35)

## 2019-09-22 ENCOUNTER — Encounter (HOSPITAL_COMMUNITY): Payer: Self-pay | Admitting: Internal Medicine

## 2019-09-22 ENCOUNTER — Ambulatory Visit (HOSPITAL_COMMUNITY)
Admission: RE | Admit: 2019-09-22 | Discharge: 2019-09-22 | Disposition: A | Discharge status: Home / Self Care | Payer: Medicare PPO | Source: Ambulatory Visit | Attending: Internal Medicine | Admitting: Internal Medicine

## 2019-09-22 ENCOUNTER — Other Ambulatory Visit: Payer: Self-pay

## 2019-09-22 VITALS — BP 150/80 | HR 72 | Wt 146.2 lb

## 2019-09-22 DIAGNOSIS — R9431 Abnormal electrocardiogram [ECG] [EKG]: Secondary | ICD-10-CM | POA: Insufficient documentation

## 2019-09-22 DIAGNOSIS — C169 Malignant neoplasm of stomach, unspecified: Secondary | ICD-10-CM | POA: Insufficient documentation

## 2019-09-22 DIAGNOSIS — I10 Essential (primary) hypertension: Secondary | ICD-10-CM | POA: Insufficient documentation

## 2019-09-22 DIAGNOSIS — Z79899 Other long term (current) drug therapy: Secondary | ICD-10-CM | POA: Diagnosis not present

## 2019-09-22 DIAGNOSIS — C162 Malignant neoplasm of body of stomach: Secondary | ICD-10-CM

## 2019-09-22 DIAGNOSIS — K5730 Diverticulosis of large intestine without perforation or abscess without bleeding: Secondary | ICD-10-CM | POA: Insufficient documentation

## 2019-09-22 DIAGNOSIS — C799 Secondary malignant neoplasm of unspecified site: Secondary | ICD-10-CM

## 2019-09-22 DIAGNOSIS — I509 Heart failure, unspecified: Secondary | ICD-10-CM

## 2019-09-22 DIAGNOSIS — Z87891 Personal history of nicotine dependence: Secondary | ICD-10-CM | POA: Diagnosis not present

## 2019-09-22 DIAGNOSIS — Z8546 Personal history of malignant neoplasm of prostate: Secondary | ICD-10-CM | POA: Diagnosis not present

## 2019-09-22 DIAGNOSIS — M1990 Unspecified osteoarthritis, unspecified site: Secondary | ICD-10-CM | POA: Insufficient documentation

## 2019-09-22 DIAGNOSIS — Z7901 Long term (current) use of anticoagulants: Secondary | ICD-10-CM | POA: Insufficient documentation

## 2019-09-22 NOTE — Progress Notes (Signed)
CARDIO-ONCOLOGY CLINIC CONSULT NOTE  Referring Physician: Dr. Jana Hakim   HPI:  [name] is 85 y.o. male with metastatic gastric adenoCA referred by Dr. Doris Cheadle for enrollment into the Cardio-Oncology program for cardiac surveillance while receiving Enhertu immunotherapy.   Has h/o HTN and prostate CA. Denies any h/o known heart disease. Active with no CP or SOB.   Diagnosed with gastric adenoCA in 1/20.   Treatment course:  (1) started gemcitabine/Abraxane 10/09/2018, repeated days 1, 8 and 15 of each 28-day cycle             (a) stopped after 1 cycle (3 doses) with reassessment of diagnosis based on CARIS results, noted above  (2) started oxaliplatin, capecitabine and trastuzumab 11/06/2018             (a) baseline echocardiogram 09/20/2018 shows an ejection fraction in the 55-60% range             (b) oxaliplatin and trastuzumab every 21 days started 11/06/2018             (c) capecitabine dose 1000 mg twice daily for 14 days of every 21-day cycle             (d) CT of the abdomen and pelvis 02/11/2019 shows marked improvement in his liver lesions and resolution of periportal adenopathy             (e) tumor markers also show significant response,             (f) echocardiogram 02/12/2019 shows an ejection fraction in the 60-65% range             (g) CT abd/pelvis show stable/ improved disease, tumor markers nearly normal             (h) oxaliplatin stopped aftwer 04/30/2019 dose             (i) capecitabine stopped after September cycle              (3) trastuzumab maintenance started 05/20/2019   Recent restaging studies showed marked progression of disease and about to change to Enhertu.          ECHO: 08/25/19 EF 60-65% Personally reviewed     Review of Systems: [y] = yes, [ ]  = no   General: Weight gain [ ] ; Weight loss [ y]; Anorexia [ ] ; Fatigue Blue.Reese ]; Fever [ ] ; Chills [ ] ; Weakness [ ]   Cardiac: Chest pain/pressure [ ] ; Resting SOB [ ] ; Exertional SOB [ ] ;  Orthopnea [ ] ; Pedal Edema [ ] ; Palpitations [ ] ; Syncope [ ] ; Presyncope [ ] ; Paroxysmal nocturnal dyspnea[ ]   Pulmonary: Cough [ ] ; Wheezing[ ] ; Hemoptysis[ ] ; Sputum [ ] ; Snoring [ ]   GI: Vomiting[ ] ; Dysphagia[ ] ; Melena[ ] ; Hematochezia [ ] ; Heartburn[ ] ; Abdominal pain [ ] ; Constipation [ ] ; Diarrhea [ ] ; BRBPR [ ]   GU: Hematuria[ ] ; Dysuria [ ] ; Nocturia[ ]   Vascular: Pain in legs with walking [ ] ; Pain in feet with lying flat [ ] ; Non-healing sores [ ] ; Stroke [ ] ; TIA [ ] ; Slurred speech [ ] ;  Neuro: Headaches[ ] ; Vertigo[ ] ; Seizures[ ] ; Paresthesias[ ] ;Blurred vision [ ] ; Diplopia [ ] ; Vision changes [ ]   Ortho/Skin: Arthritis Blue.Reese ]; Joint pain [ y]; Muscle pain [ ] ; Joint swelling [ ] ; Back Pain [ ] ; Rash [ ]   Psych: Depression[ ] ; Anxiety[ ]   Heme: Bleeding problems [ ] ; Clotting disorders [ ] ; Anemia [ ]   Endocrine: Diabetes [ ] ;  Thyroid dysfunction[ ]    Past Medical History:  Diagnosis Date  . Arthritis   . Diverticulosis of colon   . Family history of breast cancer   . Family history of prostate cancer   . HTN (hypertension)   . Hx of colonic polyp   . Pneumonia    as a child/baby  . Poor circulation of extremity    right leg  . Prostate cancer [facility]) 2015   prostate cancer - radiation treated    Current Outpatient Medications  Medication Sig Dispense Refill  . amLODipine (NORVASC) 5 MG tablet TAKE ONE TABLET BY MOUTH ONE TIME DAILY  90 tablet 2  . apixaban (ELIQUIS) 2.5 MG TABS tablet Take 1 tablet (2.5 mg total) by mouth 2 (two) times daily. 60 tablet 11  . benazepril (LOTENSIN) 40 MG tablet TAKE ONE TABLET BY MOUTH ONE TIME DAILY  90 tablet 2  . Cholecalciferol 1000 UNITS tablet Take 1,000 Units by mouth daily.     Marland Kitchen latanoprost (XALATAN) 0.005 % ophthalmic solution Place 1 drop into both eyes at bedtime.     . lidocaine-prilocaine (EMLA) cream Apply to affected area once 30 g 3  . ondansetron (ZOFRAN) 4 MG tablet Take 1 tablet (4 mg total) by mouth every 8  (eight) hours as needed for nausea or vomiting. 20 tablet 1   No current facility-administered medications for this encounter.   Facility-Administered Medications Ordered in Other Encounters  Medication Dose Route Frequency Provider Last Rate Last Admin  . sodium chloride flush (NS) 0.9 % injection 10 mL  10 mL Intravenous PRN Magrinat, Virgie Dad, MD        No Known Allergies    Social History   Socioeconomic History  . Marital status: Married    Spouse name: Not on file  . Number of children: 4  . Years of education: Not on file  . Highest education level: Not on file  Occupational History  . Occupation: retired  Tobacco Use  . Smoking status: Former Smoker    Years: 4.00    Quit date: 09/12/1967    Years since quitting: 52.0  . Smokeless tobacco: Never Used  Substance and Sexual Activity  . Alcohol use: No  . Drug use: No  . Sexual activity: Not on file  Other Topics Concern  . Not on file  Social History Narrative  . Not on file   Social Determinants of Health   Financial Resource Strain:   . Difficulty of Paying Living Expenses: Not on file  Food Insecurity:   . Worried About Charity fundraiser in the Last Year: Not on file  . Ran Out of Food in the Last Year: Not on file  Transportation Needs:   . Lack of Transportation (Medical): Not on file  . Lack of Transportation (Non-Medical): Not on file  Physical Activity:   . Days of Exercise per Week: Not on file  . Minutes of Exercise per Session: Not on file  Stress:   . Feeling of Stress : Not on file  Social Connections:   . Frequency of Communication with Friends and Family: Not on file  . Frequency of Social Gatherings with Friends and Family: Not on file  . Attends Religious Services: Not on file  . Active Member of Clubs or Organizations: Not on file  . Attends Archivist Meetings: Not on file  . Marital Status: Not on file  Intimate Partner Violence:   . Fear of Current or Ex-Partner:  Not on  file  . Emotionally Abused: Not on file  . Physically Abused: Not on file  . Sexually Abused: Not on file      Family History  Problem Relation Age of Onset  . Prostate cancer Father        dx >50  . Hypertension Mother   . Ulcers Mother   . Breast cancer Sister        dx 20's  . Prostate cancer Brother        dx under 60, metasttic, cause of his death  . Ulcers Brother   . Ulcers Sister     Vitals:   09/22/19 1149  BP: (!) 150/80  Pulse: 72  SpO2: 99%  Weight: 66.3 kg (146 lb 3.2 oz)    PHYSICAL EXAM: General:  Well appearing. No respiratory difficulty HEENT: normal Neck: supple. no JVD. Carotids 2+ bilat; no bruits. No lymphadenopathy or thryomegaly appreciated. Cor: PMI nondisplaced. Regular rate & rhythm. No rubs, gallops or murmurs. R portacath Lungs: clear Abdomen: soft, nontender, nondistended. No hepatosplenomegaly. No bruits or masses. Good bowel sounds. Extremities: no cyanosis, clubbing, rash, edema Neuro: alert & oriented x 3, cranial nerves grossly intact. moves all 4 extremities w/o difficulty. Affect pleasant.  ECG:  NSR 74 +LVH Personally reviewed   ASSESSMENT & PLAN:  1. Metastatic gastric adenoCA - has tolerated Herceptin well with out any cardiac toxicity. Echo 12/20 reviewed and EF 60% - suspect he will tolerate Enhertu well.  - Explained incidence of immunotherapy cardiotoxicity and role of Cardio-oncology clinic at length. Echo images reviewed personally. All parameters stable. Reviewed signs and symptoms of HF to look for. Continue Herceptin. Follow-up with echo in 3 months.   Glori Bickers, MD  12:33 PM

## 2019-09-22 NOTE — Patient Instructions (Addendum)
No medication changes today!  Your physician has requested that you have an echocardiogram. Echocardiography is a painless test that uses sound waves to create images of your heart. It provides your doctor with information about the size and shape of your heart and how well your heart's chambers and valves are working. This procedure takes approximately one hour. There are no restrictions for this procedure.   Your physician recommends that you schedule a follow-up appointment in: 3 months with an ECHO.  Your appointment is as follows:   When: Monday, April 19th, 2021  What time:  8am for ECHO AND 9am appointment with Dr Haroldine Laws Where:  [facility]: Entrance C, Garage Code: [id number]  Please call office at [phone] option 2 if you have any questions or concerns prior to your next appointment.   At the [facility], you and your health needs are our priority. As part of our continuing mission to provide you with exceptional heart care, we have created designated Provider Care Teams. These Care Teams include your primary Cardiologist (physician) and Advanced Practice Providers (APPs- Physician Assistants and Nurse Practitioners) who all work together to provide you with the care you need, when you need it.   You may see any of the following providers on your designated Care Team at your next follow up: Marland Kitchen Dr Glori Bickers . Dr Loralie Champagne . Darrick Grinder, NP . Lyda Jester, PA . Audry Riles, PharmD   Please be sure to bring in all your medications bottles to every appointment.

## 2019-09-23 ENCOUNTER — Other Ambulatory Visit: Payer: Self-pay | Admitting: Oncology

## 2019-09-23 NOTE — Progress Notes (Unsigned)
[facility]  [phone] [phone]     ID: DECARLO RIVET DOB: 08-14-1935  MR#: [id number]  XTK#:[id number]  Patient Care Team: Cassandria Anger, MD as PCP - General Milus Banister, MD as Attending Physician (Gastroenterology) Avonne Berkery, Virgie Dad, MD as Consulting Physician (Oncology) Gatha Mayer, MD as Consulting Physician (Gastroenterology) [name], MD as Consulting Physician (Ophthalmology) Ramon Dredge, MD as Referring Physician (Radiation Oncology) Larey Dresser, MD as Consulting Physician (Cardiology) OTHER MD:   CHIEF COMPLAINT: Metastatic gastric adenocarcinoma  CURRENT TREATMENT: To start pembrolizumab, Enhertu   INTERVAL HISTORY: [facility] returns today for follow-up and treatment of his metastatic gastric adenocarcinoma.   He was restarted on FOLFOX due to progression on anti-HER-2 treatment only.  He started treatment on 07/29/2019 to be given on day 1 of a 14 day cycle.  Today is cycle 4 day 1 of treatment.    He continues on trastuzumab with good tolerance. He receives this every 2 weeks.    His most recent echocardiogram on 08/25/2019 showed an ejection fraction of 60-65%.  He has a new patient appointment with Dr. Haroldine Laws on 09/22/2019.    Unfortunately since his last visit, he underwent restaging abdomen/pelvis CT yesterday, 09/08/2019, which revealed: enlarging and increasingly necrotic hepatic metastases; grossly stable probable distal gastric mass and adjacent adenopathy; stable small pericardial effusion.   REVIEW OF SYSTEMS: Rigby continues to feel and generally do well.  He has a good appetite, no nausea or vomiting, and no pain.  Christmas was quiet just him and his wife and they are taking appropriate pandemic precautions.  He has tolerated treatment well.  He has had no bleeding problems from the Eliquis.  A detailed review of systems today was otherwise stable.   HISTORY OF CURRENT  ILLNESS: From the original intake note:  [facility] presented to the emergency room 09/07/2018 with mid/upper abdominal pain.  He reported weight loss of 25 pounds over the past 6 months and significant anorexia.  His liver function tests were abnormal and a right upper quadrant ultrasound was obtained.  This was read as concerning for malignancy and a CT of the abdomen and pelvis with contrast was obtained the same day, showing  1. Too numerous to count hypodense masses of the liver concerning for hepatic metastasis. In the region of the porta hepatis, there are hypodense lesions more likely associated with the liver though the pancreatic head is partially obscured by a 3.2 cm hypodense mass. Findings are more likely associated with the liver and less likely an isolated pancreatic mass. 2. Upper abdominal mesenteric and peripancreatic lymphadenopathy suspicious for metastatic disease. 3. 6 mm sclerotic density in the left iliac bone adjacent to the SI joint. Given history of prostate cancer, differential considerations would include osteoblastic metastasis or possibly bone island. Showed only diverticular disease.  He was referred to GI and Dr. Ardis Hughs notes a colonoscopy performed 04/23/2017 makes the possibility of colon cancer unlikely.  Further work-up was planned, but on 09/19/2018 the patient again presented to the emergency room with chest pain, now in a different area, and a staging CT scan of the chest showed filling defects in the right lower lobe pulmonary arteries consistent with embolism.  There was a small pericardial effusion and an additional 2.4 cm left thyroid mass.  Aortic atherosclerosis was noted  He was started on intravenous heparin and on 09/20/2018 underwent upper endoscopy under Dr. Carlean Purl showing a giant nonbleeding cratered gastric ulcer in  the posterior wall of the stomach.  Biopsies of this procedure showed (SZA 20-187) adenocarcinoma.   At that point we were  consulted and we obtained tumor markers which included a CEA of 1318.2, a CA 19-9 of 67,479, a normal AFP and a normal PSA.  I discussed the case with pathology and they do not feel that additional testing would be helpful in terms of discriminating between a primary gastric and a primary biliary/pancreatic tumor.  The patient's subsequent history is as detailed below.   PAST MEDICAL HISTORY: Past Medical History:  Diagnosis Date  . Arthritis   . Diverticulosis of colon   . Family history of breast cancer   . Family history of prostate cancer   . HTN (hypertension)   . Hx of colonic polyp   . Pneumonia    as a child/baby  . Poor circulation of extremity    right leg  . Prostate cancer ([place]) 2015   prostate cancer - radiation treated     PAST SURGICAL HISTORY: Past Surgical History:  Procedure Laterality Date  . Welcome VITRECTOMY WITH 20 GAUGE MVR PORT FOR MACULAR HOLE Right 09/19/2016   Procedure: 25 GAUGE PARS PLANA VITRECTOMY WITH 20 GAUGE MVR PORT FOR MACULAR HOLE, MEMBRANE PEEL, SERUM PATCH, GAS FLUID EXCHANGE, HEADSCOPE LASER;  Surgeon: [name], MD;  [facility]: [facility];  Service: Ophthalmology;  Laterality: Right;  . BIOPSY  09/20/2018   Procedure: BIOPSY;  Surgeon: Gatha Mayer, MD;  Location: [facility] ENDOSCOPY;  Service: Endoscopy;;  . CATARACT EXTRACTION  08/27/12   Right  . COLONOSCOPY    . ESOPHAGOGASTRODUODENOSCOPY (EGD) WITH PROPOFOL N/A 09/20/2018   Procedure: ESOPHAGOGASTRODUODENOSCOPY (EGD) WITH PROPOFOL;  Surgeon: Gatha Mayer, MD;  Location: [facility];  Service: Endoscopy;  Laterality: N/A;  . HAND SURGERY  2016   right thumb  . IR IMAGING GUIDED PORT INSERTION  11/04/2018  . KNEE SURGERY Left 1989     FAMILY HISTORY: Family History  Problem Relation Age of Onset  . Prostate cancer Father        dx >50  . Hypertension Mother   . Ulcers Mother   . Breast cancer Sister        dx 20's  . Prostate cancer Brother        dx under 62,  metasttic, cause of his death  . Ulcers Brother   . Ulcers Sister    Tjay's father died from heart complications with a pacemaker at age 35; Dann's father also has prostate cancer. Patients' mother died from natural causes at age 54. The patient has 4 brothers and 3 sisters. One of [place] sisters had breast cancer in her 59s and a brother had prostate cancer. Patient denies anyone in her family having ovarian, or pancreatic cancer. Khalin mentions that his youngest sister and his brother with prostate cancer also had stomach ulcers.    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 His wife, [name], is a retired [employer] summer Radiographer, therapeutic. As of 09/2018, they have been married for 56 years. They have 4 children, Claiborne Billings, [place], [place], and Joshua. Claiborne Billings lives in [place] and works at the [employer]. Sherrod lives in [place] and works at [employer] and as a part Horticulturist, commercial. Germane lives in [place] and is a Engineer, drilling for a medical company. Beverely Low lives in [place] and works at [employer]. Rui has 7 grandchildren and 1 step great grandchild. He attends [employer].  ADVANCED DIRECTIVES: His wife, [name], is automatically his healthcare power of attorney.     HEALTH MAINTENANCE: Social History   Tobacco Use  . Smoking status: Former Smoker    Years: 4.00    Quit date: 09/12/1967    Years since quitting: 52.0  . Smokeless tobacco: Never Used  Substance Use Topics  . Alcohol use: No  . Drug use: No    Colonoscopy: 2018/[facility]  PSA: 0.54 on 09/18/2018  No Known Allergies  Current Outpatient Medications  Medication Sig Dispense Refill  . amLODipine (NORVASC) 5 MG tablet TAKE ONE TABLET BY MOUTH ONE TIME DAILY  90 tablet 2  . apixaban (ELIQUIS) 2.5 MG TABS tablet Take 1 tablet (2.5 mg total) by mouth 2 (two) times daily. 60 tablet 11  . benazepril (LOTENSIN) 40 MG tablet TAKE ONE TABLET BY MOUTH ONE TIME DAILY  90 tablet 2  .  Cholecalciferol 1000 UNITS tablet Take 1,000 Units by mouth daily.     Marland Kitchen latanoprost (XALATAN) 0.005 % ophthalmic solution Place 1 drop into both eyes at bedtime.     . lidocaine-prilocaine (EMLA) cream Apply to affected area once 30 g 3  . ondansetron (ZOFRAN) 4 MG tablet Take 1 tablet (4 mg total) by mouth every 8 (eight) hours as needed for nausea or vomiting. 20 tablet 1   No current facility-administered medications for this visit.   Facility-Administered Medications Ordered in Other Visits  Medication Dose Route Frequency Provider Last Rate Last Admin  . sodium chloride flush (NS) 0.9 % injection 10 mL  10 mL Intravenous PRN Dontavia Brand, Virgie Dad, MD         OBJECTIVE: Older African-American in no acute distress There were no vitals filed for this visit. Wt Readings from Last 3 Encounters:  09/22/19 146 lb 3.2 oz (66.3 kg)  09/09/19 147 lb 11.2 oz (67 kg)  08/26/19 144 lb 14.4 oz (65.7 kg)   There is no height or weight on file to calculate BMI.    ECOG FS:1 - Symptomatic but completely ambulatory  Sclerae unicteric, EOMs intact Wearing a mask No cervical or supraclavicular adenopathy Lungs no rales or rhonchi Heart regular rate and rhythm Abd soft, nontender, positive bowel sounds MSK no focal spinal tenderness, no upper extremity lymphedema Neuro: nonfocal, well oriented, appropriate affect  LAB RESULTS:  CMP     Component Value Date/Time   NA 140 09/09/2019 1005   K 3.6 09/09/2019 1005   CL 104 09/09/2019 1005   CO2 27 09/09/2019 1005   GLUCOSE 112 (H) 09/09/2019 1005   GLUCOSE 105 (H) 09/13/2006 0900   BUN 11 09/09/2019 1005   CREATININE 1.09 09/09/2019 1005   CREATININE 1.02 10/16/2018 0936   CALCIUM 8.8 (L) 09/09/2019 1005   PROT 6.2 (L) 09/09/2019 1005   ALBUMIN 3.5 09/09/2019 1005   AST 34 09/09/2019 1005   AST 76 (H) 10/16/2018 0936   ALT 26 09/09/2019 1005   ALT 47 (H) 10/16/2018 0936   ALKPHOS 106 09/09/2019 1005   BILITOT 0.8 09/09/2019 1005    BILITOT 0.7 10/16/2018 0936   GFRNONAA >60 09/09/2019 1005   GFRNONAA >60 10/16/2018 0936   GFRAA >60 09/09/2019 1005   GFRAA >60 10/16/2018 0936    No results found for: TOTALPROTELP, ALBUMINELP, A1GS, A2GS, BETS, BETA2SER, GAMS, MSPIKE, SPEI  No results found for: KPAFRELGTCHN, LAMBDASER, KAPLAMBRATIO  Lab Results  Component Value Date   WBC 2.3 (L) 09/09/2019   NEUTROABS 1.0 (L) 09/09/2019   HGB 8.9 (L)  09/09/2019   HCT 29.3 (L) 09/09/2019   MCV 82.1 09/09/2019   PLT 145 (L) 09/09/2019    No results found for: LABCA2  No components found for: [id number]  No results for input(s): INR in the last 168 hours.  No results found for: LABCA2  Lab Results  Component Value Date   CAN199 50 (H) 09/09/2019    No results found for: VOJ500  No results found for: XFG182  No results found for: CA2729  No components found for: HGQUANT  Lab Results  Component Value Date   CEA1 50.17 (H) 09/09/2019   /  CEA ([facility])  Date Value Ref Range Status  09/09/2019 50.17 (H) 0.00 - 5.00 ng/mL Final    Comment:    (NOTE) This test was performed using Architect's Chemiluminescent Microparticle Immunoassay. Values obtained from different assay methods cannot be used interchangeably. Please note that 5-10% of patients who smoke may see CEA levels up to 6.9 ng/mL. Performed at [facility], [place] [address] [place], [place] [address]      No results found for: AFPTUMOR  No results found for: CHROMOGRNA  No results found for: HGBA, HGBA2QUANT, HGBFQUANT, HGBSQUAN (Hemoglobinopathy evaluation)   No results found for: LDH  Lab Results  Component Value Date   IRON 38 (L) 09/21/2018   TIBC 251 09/21/2018   IRONPCTSAT 15 (L) 09/21/2018   (Iron and TIBC)  Lab Results  Component Value Date   FERRITIN 138 09/21/2018    Urinalysis    Component Value Date/Time   COLORURINE YELLOW 02/18/2019 1056   APPEARANCEUR CLEAR 02/18/2019 1056    LABSPEC 1.018 02/18/2019 1056   PHURINE 6.0 02/18/2019 1056   GLUCOSEU NEGATIVE 02/18/2019 [place] 07/20/2017 1056   HGBUR NEGATIVE 02/18/2019 [place] 02/18/2019 [place] 02/18/2019 1056   PROTEINUR 30 (A) 02/18/2019 1056   UROBILINOGEN 0.2 07/20/2017 1056   NITRITE NEGATIVE 02/18/2019 [place] 02/18/2019 1056     STUDIES:  CT Abdomen Pelvis W Contrast  Result Date: 09/08/2019 CLINICAL DATA:  Gastrointestinal cancer, staging. Prostate cancer metastatic to stomach and liver. Ongoing chemotherapy. EXAM: CT ABDOMEN AND PELVIS WITH CONTRAST TECHNIQUE: Multidetector CT imaging of the abdomen and pelvis was performed using the standard protocol following bolus administration of intravenous contrast. CONTRAST:  185m OMNIPAQUE IOHEXOL 300 MG/ML  SOLN COMPARISON:  07/16/2019 and 09/07/2018. FINDINGS: Lower chest: Scarring in the lung bases. Nodular density in the subpleural medial right lower lobe measures 4 mm (4/3), unchanged from 09/07/2018. Heart is at the upper limits of normal in size. Small pericardial effusion, stable. No pleural fluid. Hepatobiliary: Heterogeneous nodules and masses in the liver are somewhat difficult to measure. Index mass in the right hepatic lobe measures 3.8 x 4.2 cm (2/9), increased from 3.2 x 4.2 cm on 07/16/2019 and is increasingly necrotic centrally. Index lesion in the left hepatic lobe (segment 4B) has enlarged as well, now measuring 3.0 x 3.2 cm (2/18), compared to 1.5 x 2.1 cm on 07/16/2019. This lesion also appears more centrally necrotic. Gallbladder is unremarkable. No biliary ductal dilatation. Pancreas: Negative. Spleen: Negative. Adrenals/Urinary Tract: Adrenal glands are unremarkable. Subcentimeter low-attenuation lesions in the kidneys are too small to characterize but statistically, cysts are likely. Ureters are decompressed. Bladder is grossly unremarkable. Stomach/Bowel: There may be  wall thickening at the gastroesophageal junction as well as within the mid and distal stomach. Suspect a discrete mass in the gastric antrum,  measuring 1.4 x 2.6 cm (coronal image 13), similar. Small bowel and appendix are otherwise unremarkable. Stool is seen in the majority of the colon, indicative of constipation. Vascular/Lymphatic: Atherosclerotic calcification of the aorta. 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 Adenopathy inferior to the gastric antrum measures approximately 1.5 cm (2/26), similar. Otherwise, no pathologically enlarged lymph nodes. Reproductive: Prostate is enlarged and contains brachytherapy seeds. Other: No free fluid. Mesenteries and peritoneum are otherwise unremarkable. Musculoskeletal: No worrisome lytic or sclerotic lesions. Degenerative disc disease and disc space narrowing at L3-4 and L5-S1. IMPRESSION: 1. Enlarging and increasingly necrotic hepatic metastases. 2. Probable distal gastric mass and adjacent adenopathy, grossly stable. 3. Small pericardial effusion, stable. 4. Enlarged prostate. 5. Aortic atherosclerosis (ICD10-170.0). 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 Electronically Signed   By: Lorin Picket M.D.   On: 09/08/2019 10:01   ECHOCARDIOGRAM COMPLETE  Result Date: 08/25/2019   ECHOCARDIOGRAM REPORT   Patient Name:   [facility] Date of Exam: 08/25/2019 Medical Rec #:  [id number]        Height:       64.5 in Accession #:    [id number]       Weight:       150.2 lb Date of Birth:  Mar 18, 1935       BSA:          1.74 m Patient Age:    85 years         BP:           132/72 mmHg Patient Gender: M                HR:           59 bpm. Exam Location:  Outpatient Procedure: 2D Echo Indications:    chemo evaluation  History:        Patient has prior history of Echocardiogram examinations, most                 recent 05/22/2019. Risk Factors:Hypertension.  Sonographer:    Raquel Sarna Senior Referring Phys:  [place]  1. Left ventricular ejection fraction, by visual estimation, is 60 to 65%. The left ventricle has normal function. There is mildly increased left ventricular hypertrophy.  2. Left ventricular diastolic parameters are consistent with Grade I diastolic dysfunction (impaired relaxation).  3. The left ventricle has no regional wall motion abnormalities.  4. Global right ventricle has normal systolic function.The right ventricular size is normal. No increase in right ventricular wall thickness.  5. Left atrial size was normal.  6. Right atrial size was normal.  7. Trivial pericardial effusion is present.  8. The mitral valve is normal in structure. Trivial mitral valve regurgitation. No evidence of mitral stenosis.  9. The tricuspid valve is normal in structure. Tricuspid valve regurgitation is mild. 10. The aortic valve is normal in structure. Aortic valve regurgitation is not visualized. No evidence of aortic valve sclerosis or stenosis. 11. The pulmonic valve was normal in structure. Pulmonic valve regurgitation is not visualized. 12. Mildly elevated pulmonary artery systolic pressure. 13. The tricuspid regurgitant velocity is 2.59 m/s, and with an assumed right atrial pressure of 10 mmHg, the estimated right ventricular systolic pressure is mildly elevated at 36.8 mmHg. 14. The inferior vena cava is normal in size with greater than 50% respiratory variability, suggesting right atrial pressure of 3 mmHg. 15. The average left ventricular global longitudinal strain is -19.0 %. FINDINGS  Left Ventricle: Left ventricular ejection fraction, by visual estimation, is 60 to 65%. The left ventricle has normal function. The average left ventricular global longitudinal strain is -19.0 %. The left ventricle has no regional wall motion abnormalities. There is mildly increased left ventricular hypertrophy. Concentric left ventricular hypertrophy. Left ventricular diastolic parameters are  consistent with Grade I diastolic dysfunction (impaired relaxation). Normal left atrial pressure. Right Ventricle: The right ventricular size is normal. No increase in right ventricular wall thickness. Global RV systolic function is has normal systolic function. The tricuspid regurgitant velocity is 2.59 m/s, and with an assumed right atrial pressure  of 10 mmHg, the estimated right ventricular systolic pressure is mildly elevated at 36.8 mmHg. Left Atrium: Left atrial size was normal in size. Right Atrium: Right atrial size was normal in size Pericardium: Trivial pericardial effusion is present. Mitral Valve: The mitral valve is normal in structure. Trivial mitral valve regurgitation. No evidence of mitral valve stenosis by observation. Tricuspid Valve: The tricuspid valve is normal in structure. Tricuspid valve regurgitation is mild. Aortic Valve: The aortic valve is normal in structure.. There is moderate thickening of the aortic valve. Aortic valve regurgitation is not visualized. The aortic valve is structurally normal, with no evidence of sclerosis or stenosis. Moderate aortic valve annular calcification. There is moderate thickening of the aortic valve. Pulmonic Valve: The pulmonic valve was normal in structure. Pulmonic valve regurgitation is not visualized. Pulmonic regurgitation is not visualized. Aorta: The aortic root, ascending aorta and aortic arch are all structurally normal, with no evidence of dilitation or obstruction. Venous: The inferior vena cava is normal in size with greater than 50% respiratory variability, suggesting right atrial pressure of 3 mmHg. IAS/Shunts: No atrial level shunt detected by color flow Doppler. There is no evidence of a patent foramen ovale. No ventricular septal defect is seen or detected. There is no evidence of an atrial septal defect.  LEFT VENTRICLE PLAX 2D LVIDd:         4.90 cm  Diastology LVIDs:         3.40 cm  LV e' lateral:   12.00 cm/s LV PW:         1.00 cm   LV E/e' lateral: 3.7 LV IVS:        1.10 cm  LV e' medial:    7.29 cm/s LVOT diam:     1.90 cm  LV E/e' medial:  6.0 LV SV:         65 ml LV SV Index:   36.87    2D Longitudinal Strain LVOT Area:     2.84 cm 2D Strain GLS Avg:     -19.0 %                          3D Volume EF:                         3D EF:        60 %                         LV EDV:       187 ml                         LV ESV:       76 ml  LV SV:        111 ml RIGHT VENTRICLE RV S prime:     16.50 cm/s TAPSE (M-mode): 2.4 cm LEFT ATRIUM             Index       RIGHT ATRIUM           Index LA diam:        3.50 cm 2.01 cm/m  RA Area:     19.30 cm LA Vol (A2C):   59.3 ml 34.05 ml/m RA Volume:   57.30 ml  32.90 ml/m LA Vol (A4C):   53.6 ml 30.78 ml/m LA Biplane Vol: 57.5 ml 33.02 ml/m  AORTIC VALVE LVOT Vmax:   124.00 cm/s LVOT Vmean:  75.500 cm/s LVOT VTI:    0.211 m  AORTA Ao Root diam: 2.70 cm Ao Asc diam:  3.20 cm MITRAL VALVE                        TRICUSPID VALVE MV Area (PHT): 2.03 cm             TR Peak grad:   26.8 mmHg MV PHT:        108.17 msec          TR Vmax:        259.00 cm/s MV Decel Time: 373 msec MV E velocity: 44.10 cm/s 103 cm/s  SHUNTS MV A velocity: 52.30 cm/s 70.3 cm/s Systemic VTI:  0.21 m MV E/A ratio:  0.84       1.5       Systemic Diam: 1.90 cm  Ena Dawley MD Electronically signed by Ena Dawley MD Signature Date/Time: 08/25/2019/10:55:32 PM    Final      ELIGIBLE FOR AVAILABLE RESEARCH PROTOCOL: no   ASSESSMENT: 85 y.o. [place], [place] man status post gastric biopsy 09/20/2018 showing adenocarcinoma, with a CA 19-9 greater than 65,000; staging studies showing an apparent mass adjacent to the head of the pancreas, innumerable liver lesions, upper abdominal mesenteric and peripancreatic lymphadenopathy, and an isolated sclerotic density in the left iliac bone  (a) genomics requested on gastric biopsy showed the tumor to be HER-2 amplified at 3+, PD-L1 positive, and T p53 mutated.   MSI is stable, mismatch repair status is proficient and the tumor mutational burden is intermediate.  (1) chest CT scan 09/19/2018 shows right lower lobe pulmonary embolism  (a) on intravenous heparin started 09/19/2018, transitioned to apixaban 10/01/2018  (2) genetics testing 10/04/2018: negative  (a) family history of prostate and early breast cancer; patient has a history of prostate cancer  (3) started gemcitabine/Abraxane 10/09/2018, repeated days 1, 8 and 15 of each 28-day cycle  (a) stopped after 1 cycle (3 doses) with reassessment of diagnosis based on CARIS results, noted above  (4) started oxaliplatin, capecitabine and trastuzumab 11/06/2018  (a) baseline echocardiogram 09/20/2018 shows an ejection fraction in the 55-60% range  (b) oxaliplatin and trastuzumab every 21 days started 11/06/2018  (c) capecitabine dose 1000 mg twice daily for 14 days of every 21-day cycle  (d) CT of the abdomen and pelvis 02/11/2019 shows marked improvement in his liver lesions and resolution of periportal adenopathy  (e) tumor markers also show significant response,  (f) echocardiogram 02/12/2019 shows an ejection fraction in the 60-65% range  (g) CT abd/pelvis show stable/ improved disease, tumor markers nearly normal  (h) oxaliplatin stopped aftwer 04/30/2019 dose  (i) capecitabine stopped after September cycle   (5) trastuzumab maintenance started  05/20/2019  (6) disease progression documented by lab work and CT scan 07/16/2019  (a) resumed FOLFOX 07/29/2019, continuing trastuzumab (every 14 days).  (b) restaging studies after 3 cycles showed evidence of progression (tumor markers equivocal)  (7) to start pembrolizumab 09/15/2018, repeat every 6 weeks  (a) plan to add Enhertu 09/30/2019   (b) echocardiogram 08/25/2019 showed an ejection fraction in the 60-65% range PLAN: Avishai tolerated FOLFOX/trastuzumab generally quite well.  His tumor marker was trending up and then went down a bit.   However the CT scan shows evidence of growth in the liver lesions and that trumps the tumor marker, even though some of those lesions does show central necrosis.  We need to change therapy and we certainly have many options.  We could do FOLFIRI, and normally we would add a VEGF inhibitor at that point but since he is on Eliquis I would be hesitant to do that.  We could go to other chemotherapy combinations together with trastuzumab.  I think it would be worth while making use of the fact that his tumor is PD-L1 positive.  I am going to start him on pembrolizumab next week.  We will do the every 6-week dose to minimize visits.  Today we discussed the possible toxicities side effects and complications of this agent and I will start monitoring his thyroid function.  We can then add Enhertu to target the HER-2 positivity of his tumor.  We will discussed the possible toxicities side effects and complications of this agent more in detail when he returns to see me at the time of the first dose.  Note he just had an echo 08/25/2019 showing a well-preserved ejection fraction.  Mr. Errico has a good understanding of this plan.  He knows to call us for any problem that may develop before the next visit here.  Virgie Dad. Endy Easterly, MD 09/23/19 8:24 AM Medical Oncology and Hematology [facility] [place], [place] [address] Tel. [phone]    Fax. [phone]   I, Wilburn Mylar, am acting as scribe for Dr. Virgie Dad. Cianna Kasparian.  I, Lurline Del MD, have reviewed the above documentation for accuracy and completeness, and I agree with the above.  Results from First Combination Trial of ENHERTU and Immune Checkpoint Inhibitor in Patients with HER2 Expressing Metastatic Breast Cancer Presented at the [place] Symposium Initial results demonstrate ENHERTU may be safely combined with nivolumab, an immune checkpoint inhibitor in patients with HER2 positive  or HER2 low metastatic breast cancer Additional immunotherapy combination trials underway with [facility] to determine optimal combination strategies in these settings August 21, 2019 02:00 AM [place] Standard Time

## 2019-09-23 NOTE — Progress Notes (Signed)
Mr. Peter Wood was evaluated by Pierre Bali yesterday 09/21/2018.  He is cleared to start the Enhertu of 09/29/2018.  This has not yet been scheduled and I am entering and LOS now for that

## 2019-09-24 ENCOUNTER — Telehealth: Payer: Self-pay | Admitting: Oncology

## 2019-09-24 NOTE — Telephone Encounter (Signed)
Scheduled appt per 1/12 sch message - pt aware of appt date and time   

## 2019-09-25 NOTE — Progress Notes (Signed)
[name]  [phone] [phone]     ID: Peter Wood DOB: [birth year]  MR#: [id number]  VHQ#:[id number]  Patient Care Team: Cassandria Anger, MD as PCP - General Milus Banister, MD as Attending Physician (Gastroenterology) Trayton Szabo, Virgie Dad, MD as Consulting Physician (Oncology) Gatha Mayer, MD as Consulting Physician (Gastroenterology) [name], MD as Consulting Physician (Ophthalmology) Ramon Dredge, MD as Referring Physician (Radiation Oncology) Larey Dresser, MD as Consulting Physician (Cardiology) OTHER MD:   CHIEF COMPLAINT: Metastatic gastric adenocarcinoma  CURRENT TREATMENT: pembrolizumab, Enhertu   INTERVAL HISTORY: Peter Wood returns today for follow-up and treatment of his metastatic gastric adenocarcinoma.   He was restarted on FOLFOX due to progression on anti-HER-2 treatment only.  He started treatment on 07/29/2019 to be given on day 1 of a 14 day cycle.  He received 3 cycles, with a last 1 08/26/2019.  Repeat CT of the abdomen and pelvis 09/08/2019 suggested disease progression (there was mild decrease in his tumor markers).  He was also on trastuzumab, with his last dose 07/22/2019.  Again that was discontinued with evidence of progression  His most recent echocardiogram on 08/25/2019 showed an ejection fraction of 60-65%.    He is here to discuss continuing treatment, which will consist of Enhertu and Keytruda.  He has been cleared by Dr. Haroldine Laws to start Enhertu.   REVIEW OF SYSTEMS: [name] is generally doing well.  He has a good appetite, normal taste, stable weight, no problems with bowel movements, no gastritis or reflux problems.  He denies falls, swelling, or pain.  He is on Eliquis with no evidence of bleeding.  He has a good diet and is taking elderberry Gummies to keep the virus away he says.  His son provides these for him.  A detailed review of systems was otherwise  stable.   HISTORY OF CURRENT ILLNESS: From the original intake note:  Peter Wood presented to the emergency room 09/07/2018 with mid/upper abdominal pain.  He reported weight loss of 25 pounds over the past 6 months and significant anorexia.  His liver function tests were abnormal and a right upper quadrant ultrasound was obtained.  This was read as concerning for malignancy and a CT of the abdomen and pelvis with contrast was obtained the same day, showing  1. Too numerous to count hypodense masses of the liver concerning for hepatic metastasis. In the region of the porta hepatis, there are hypodense lesions more likely associated with the liver though the pancreatic head is partially obscured by a 3.2 cm hypodense mass. Findings are more likely associated with the liver and less likely an isolated pancreatic mass. 2. Upper abdominal mesenteric and peripancreatic lymphadenopathy suspicious for metastatic disease. 3. 6 mm sclerotic density in the left iliac bone adjacent to the SI joint. Given history of prostate cancer, differential considerations would include osteoblastic metastasis or possibly bone island. Showed only diverticular disease.  He was referred to GI and Dr. Ardis Hughs notes a colonoscopy performed 04/23/2017 makes the possibility of colon cancer unlikely.  Further work-up was planned, but on 09/19/2018 the patient again presented to the emergency room with chest pain, now in a different area, and a staging CT scan of the chest showed filling defects in the right lower lobe pulmonary arteries consistent with embolism.  There was a small pericardial effusion and an additional 2.4 cm left thyroid mass.  Aortic atherosclerosis was noted  He was started on intravenous heparin and on 09/20/2018  underwent upper endoscopy under Dr. Carlean Purl showing a giant nonbleeding cratered gastric ulcer in the posterior wall of the stomach.  Biopsies of this procedure showed (SZA 20-187) adenocarcinoma.    At that point we were consulted and we obtained tumor markers which included a CEA of 1318.2, a CA 19-9 of 67,479, a normal AFP and a normal PSA.  I discussed the case with pathology and they do not feel that additional testing would be helpful in terms of discriminating between a primary gastric and a primary biliary/pancreatic tumor.  The patient's subsequent history is as detailed below.   PAST MEDICAL HISTORY: Past Medical History:  Diagnosis Date  . Arthritis   . Diverticulosis of colon   . Family history of breast cancer   . Family history of prostate cancer   . HTN (hypertension)   . Hx of colonic polyp   . Pneumonia    as a child/baby  . Poor circulation of extremity    right leg  . Prostate cancer ([place]) 2015   prostate cancer - radiation treated     PAST SURGICAL HISTORY: Past Surgical History:  Procedure Laterality Date  . [place] VITRECTOMY WITH 20 GAUGE MVR PORT FOR MACULAR HOLE Right 09/19/2016   Procedure: 25 GAUGE PARS PLANA VITRECTOMY WITH 20 GAUGE MVR PORT FOR MACULAR HOLE, MEMBRANE PEEL, SERUM PATCH, GAS FLUID EXCHANGE, HEADSCOPE LASER;  Surgeon: [name], MD;  Location: [place];  Service: Ophthalmology;  Laterality: Right;  . BIOPSY  09/20/2018   Procedure: BIOPSY;  Surgeon: Gatha Mayer, MD;  Location: [facility] END[facility]COPY;  Service: Endoscopy;;  . CATARACT EXTRACTION  08/27/12   Right  . COLONOSCOPY    . ESOPHAGOGASTRODUODENOSCOPY (EGD) WITH PROPOFOL N/A 09/20/2018   Procedure: ESOPHAGOGASTRODUODENOSCOPY (EGD) WITH PROPOFOL;  Surgeon: Gatha Mayer, MD;  Location: [place];  Service: Endoscopy;  Laterality: N/A;  . HAND SURGERY  2016   right thumb  . IR IMAGING GUIDED PORT INSERTION  11/04/2018  . KNEE SURGERY Left 1989     FAMILY HISTORY: Family History  Problem Relation Age of Onset  . Prostate cancer Father        dx >50  . Hypertension Mother   . Ulcers Mother   . Breast cancer Sister        dx 20's  . Prostate cancer  Brother        dx under 25, metasttic, cause of his death  . Ulcers Brother   . Ulcers Sister    Keante's father died from heart complications with a pacemaker at age 33; Kalven's father also has prostate cancer. Patients' mother died from natural causes at age 62. The patient has 4 brothers and 3 sisters. One of [place] sisters had breast cancer in her 13s and a brother had prostate cancer. Patient denies anyone in her family having ovarian, or pancreatic cancer. Vondell mentions that his youngest sister and his brother with prostate cancer also had stomach ulcers.    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 His wife, [name], is a retired [employer] summer Radiographer, therapeutic. As of 09/2018, they have been married for 56 years. They have 4 children, Claiborne Billings, [place], [place], and Denham. Claiborne Billings lives in [place] and works at the [employer]. Sherrod lives in [place] and works at [employer] and as a part Horticulturist, commercial. Germane lives in [place] and is a Engineer, drilling for a medical company. Beverely Low lives in [place] and works at [employer]. Izell [place]  has 7 grandchildren and 1 step great grandchild. He attends [employer].    ADVANCED DIRECTIVES: His wife, [name], is automatically his healthcare power of attorney.     HEALTH MAINTENANCE: Social History   Tobacco Use  . Smoking status: Former Smoker    Years: 4.00    Quit date: 09/12/1967    Years since quitting: 52.0  . Smokeless tobacco: Never Used  Substance Use Topics  . Alcohol use: No  . Drug use: No    Colonoscopy: 2018/[facility]  PSA: 0.54 on 09/18/2018  No Known Allergies  Current Outpatient Medications  Medication Sig Dispense Refill  . amLODipine (NORVASC) 5 MG tablet TAKE ONE TABLET BY MOUTH ONE TIME DAILY  90 tablet 2  . apixaban (ELIQUIS) 2.5 MG TABS tablet Take 1 tablet (2.5 mg total) by mouth 2 (two) times daily. 60 tablet 11  . benazepril (LOTENSIN) 40 MG tablet TAKE ONE TABLET BY MOUTH ONE  TIME DAILY  90 tablet 2  . Cholecalciferol 1000 UNITS tablet Take 1,000 Units by mouth daily.     Marland Kitchen latanoprost (XALATAN) 0.005 % ophthalmic solution Place 1 drop into both eyes at bedtime.     . lidocaine-prilocaine (EMLA) cream Apply to affected area once 30 g 3  . ondansetron (ZOFRAN) 4 MG tablet Take 1 tablet (4 mg total) by mouth every 8 (eight) hours as needed for nausea or vomiting. 20 tablet 1   No current facility-administered medications for this visit.   Facility-Administered Medications Ordered in Other Visits  Medication Dose Route Frequency Provider Last Rate Last Admin  . sodium chloride flush (NS) 0.9 % injection 10 mL  10 mL Intravenous PRN Katelind Pytel, Virgie Dad, MD         OBJECTIVE: Older African-American who appears well  Vitals:   09/26/19 0911  BP: 113/85  Pulse: 71  Resp: 18  Temp: 98.5 F (36.9 C)  SpO2: 100%   Wt Readings from Last 3 Encounters:  09/26/19 144 lb 12.8 oz (65.7 kg)  09/22/19 146 lb 3.2 oz (66.3 kg)  09/09/19 147 lb 11.2 oz (67 kg)   Body mass index is 24.47 kg/m.    ECOG FS:1 - Symptomatic but completely ambulatory  Sclerae unicteric, EOMs intact Wearing a mask No cervical or supraclavicular adenopathy Lungs no rales or rhonchi Heart regular rate and rhythm Abd soft, nontender, positive bowel sounds MSK no focal spinal tenderness, no upper extremity lymphedema Neuro: nonfocal, well oriented, appropriate affect Skin: Sacral hyperpigmentation secondary to chemo as previously noted   LAB RESULTS:  CMP     Component Value Date/Time   NA 140 09/09/2019 1005   K 3.6 09/09/2019 1005   CL 104 09/09/2019 1005   CO2 27 09/09/2019 1005   GLUCOSE 112 (H) 09/09/2019 1005   GLUCOSE 105 (H) 09/13/2006 0900   BUN 11 09/09/2019 1005   CREATININE 1.09 09/09/2019 1005   CREATININE 1.02 10/16/2018 0936   CALCIUM 8.8 (L) 09/09/2019 1005   PROT 6.2 (L) 09/09/2019 1005   ALBUMIN 3.5 09/09/2019 1005   AST 34 09/09/2019 1005   AST 76 (H)  10/16/2018 0936   ALT 26 09/09/2019 1005   ALT 47 (H) 10/16/2018 0936   ALKPHOS 106 09/09/2019 1005   BILITOT 0.8 09/09/2019 1005   BILITOT 0.7 10/16/2018 0936   GFRNONAA >60 09/09/2019 1005   GFRNONAA >60 10/16/2018 0936   GFRAA >60 09/09/2019 1005   GFRAA >60 10/16/2018 0936    No results found for: Ronnald Ramp,  A1GS, A2GS, BETS, BETA2SER, GAMS, MSPIKE, SPEI  No results found for: KPAFRELGTCHN, LAMBDASER, KAPLAMBRATIO  Lab Results  Component Value Date   WBC 2.3 (L) 09/09/2019   NEUTROABS 1.0 (L) 09/09/2019   HGB 8.9 (L) 09/09/2019   HCT 29.3 (L) 09/09/2019   MCV 82.1 09/09/2019   PLT 145 (L) 09/09/2019    No results found for: LABCA2  No components found for: [id number]  No results for input(s): INR in the last 168 hours.  No results found for: LABCA2  Lab Results  Component Value Date   CAN199 50 (H) 09/09/2019    No results found for: WUJ811  No results found for: BJY782  No results found for: CA2729  No components found for: HGQUANT  Lab Results  Component Value Date   CEA1 50.17 (H) 09/09/2019   /  CEA ([facility])  Date Value Ref Range Status  09/09/2019 50.17 (H) 0.00 - 5.00 ng/mL Final    Comment:    (NOTE) This test was performed using Architect's Chemiluminescent Microparticle Immunoassay. Values obtained from different assay methods cannot be used interchangeably. Please note that 5-10% of patients who smoke may see CEA levels up to 6.9 ng/mL. Performed at [facility], [place] [address], [place], [place] [address]      No results found for: AFPTUMOR  No results found for: CHROMOGRNA  No results found for: HGBA, HGBA2QUANT, HGBFQUANT, HGBSQUAN (Hemoglobinopathy evaluation)   No results found for: LDH  Lab Results  Component Value Date   IRON 38 (L) 09/21/2018   TIBC 251 09/21/2018   IRONPCTSAT 15 (L) 09/21/2018   (Iron and TIBC)  Lab Results  Component Value Date   FERRITIN 138  09/21/2018    Urinalysis    Component Value Date/Time   COLORURINE YELLOW 02/18/2019 1056   APPEARANCEUR CLEAR 02/18/2019 1056   LABSPEC 1.018 02/18/2019 1056   PHURINE 6.0 02/18/2019 1056   GLUCOSEU NEGATIVE 02/18/2019 [place] 07/20/2017 1056   HGBUR NEGATIVE 02/18/2019 [place] 02/18/2019 [place] 02/18/2019 1056   PROTEINUR 30 (A) 02/18/2019 1056   UROBILINOGEN 0.2 07/20/2017 1056   NITRITE NEGATIVE 02/18/2019 [place] 02/18/2019 1056     STUDIES:  CT Abdomen Pelvis W Contrast  Result Date: 09/08/2019 CLINICAL DATA:  Gastrointestinal cancer, staging. Prostate cancer metastatic to stomach and liver. Ongoing chemotherapy. EXAM: CT ABDOMEN AND PELVIS WITH CONTRAST TECHNIQUE: Multidetector CT imaging of the abdomen and pelvis was performed using the standard protocol following bolus administration of intravenous contrast. CONTRAST:  123m OMNIPAQUE IOHEXOL 300 MG/ML  SOLN COMPARISON:  07/16/2019 and 09/07/2018. FINDINGS: Lower chest: Scarring in the lung bases. Nodular density in the subpleural medial right lower lobe measures 4 mm (4/3), unchanged from 09/07/2018. Heart is at the upper limits of normal in size. Small pericardial effusion, stable. No pleural fluid. Hepatobiliary: Heterogeneous nodules and masses in the liver are somewhat difficult to measure. Index mass in the right hepatic lobe measures 3.8 x 4.2 cm (2/9), increased from 3.2 x 4.2 cm on 07/16/2019 and is increasingly necrotic centrally. Index lesion in the left hepatic lobe (segment 4B) has enlarged as well, now measuring 3.0 x 3.2 cm (2/18), compared to 1.5 x 2.1 cm on 07/16/2019. This lesion also appears more centrally necrotic. Gallbladder is unremarkable. No biliary ductal dilatation. Pancreas: Negative. Spleen: Negative. Adrenals/Urinary Tract: Adrenal glands are unremarkable. Subcentimeter low-attenuation lesions in the kidneys are too small to  characterize  but statistically, cysts are likely. Ureters are decompressed. Bladder is grossly unremarkable. Stomach/Bowel: There may be wall thickening at the gastroesophageal junction as well as within the mid and distal stomach. Suspect a discrete mass in the gastric antrum, measuring 1.4 x 2.6 cm (coronal image 13), similar. Small bowel and appendix are otherwise unremarkable. Stool is seen in the majority of the colon, indicative of constipation. Vascular/Lymphatic: Atherosclerotic calcification of the aorta. 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 Adenopathy inferior to the gastric antrum measures approximately 1.5 cm (2/26), similar. Otherwise, no pathologically enlarged lymph nodes. Reproductive: Prostate is enlarged and contains brachytherapy seeds. Other: No free fluid. Mesenteries and peritoneum are otherwise unremarkable. Musculoskeletal: No worrisome lytic or sclerotic lesions. Degenerative disc disease and disc space narrowing at L3-4 and L5-S1. IMPRESSION: 1. Enlarging and increasingly necrotic hepatic metastases. 2. Probable distal gastric mass and adjacent adenopathy, grossly stable. 3. Small pericardial effusion, stable. 4. Enlarged prostate. 5. Aortic atherosclerosis (ICD10-170.0). 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 Electronically Signed   By: Lorin Picket M.D.   On: 09/08/2019 10:01     ELIGIBLE FOR AVAILABLE RESEARCH PROTOCOL: no   ASSESSMENT: 85 y.o. [place], [place] man status post gastric biopsy 09/20/2018 showing adenocarcinoma, with a CA 19-9 greater than 65,000; staging studies showing an apparent mass adjacent to the head of the pancreas, innumerable liver lesions, upper abdominal mesenteric and peripancreatic lymphadenopathy, and an isolated sclerotic density in the left iliac bone  (a) genomics requested on gastric biopsy showed the tumor to be HER-2 amplified at 3+, PD-L1 positive, and T p53 mutated.  MSI  is stable, mismatch repair status is proficient and the tumor mutational burden is intermediate.  (1) chest CT scan 09/19/2018 shows right lower lobe pulmonary embolism  (a) on intravenous heparin started 09/19/2018, transitioned to apixaban 10/01/2018  (2) genetics testing 10/04/2018: negative  (a) family history of prostate and early breast cancer; patient has a history of prostate cancer  (3) started gemcitabine/Abraxane 10/09/2018, repeated days 1, 8 and 15 of each 28-day cycle  (a) stopped after 1 cycle (3 doses) with reassessment of diagnosis based on CARIS results, noted above  (4) started oxaliplatin, capecitabine and trastuzumab 11/06/2018  (a) baseline echocardiogram 09/20/2018 shows an ejection fraction in the 55-60% range  (b) oxaliplatin and trastuzumab every 21 days started 11/06/2018  (c) capecitabine dose 1000 mg twice daily for 14 days of every 21-day cycle  (d) CT of the abdomen and pelvis 02/11/2019 shows marked improvement in his liver lesions and resolution of periportal adenopathy  (e) tumor markers also show significant response,  (f) echocardiogram 02/12/2019 shows an ejection fraction in the 60-65% range  (g) CT abd/pelvis show stable/ improved disease, tumor markers nearly normal  (h) oxaliplatin stopped aftwer 04/30/2019 dose  (i) capecitabine stopped after September cycle   (5) trastuzumab maintenance started 05/20/2019, last dose 07/22/2019, with progression  (6) disease progression documented by lab work and CT scan 07/16/2019  (a) resumed FOLFOX 07/29/2019, continuing trastuzumab (every 14 days).  (b) restaging studies after 3 cycles showed evidence of progression (tumor markers equivocal)  (c) FOLFOX discontinued 08/26/2019  (7) to start Enhertu 09/30/2019, repeat every 21 days  (a) echocardiogram 08/25/2019 showed an ejection fraction in the 60-65% range  (8) to start pembrolizumab 10/21/2019, repeat every 21 days    PLAN: Arth is doing  remarkably well considering his diagnosis and stage and in particular he is having no problems with diet or digestion.  He  also has no symptoms related to his liver involvement by tumor or to his anticoagulation.  We reviewed his CT scan and also his tumor marker results, these being slightly discrepant.  We are switching him to [facility] and discussed the possible toxicities side effects and complications of this agent.  We could immediately start pembrolizumab as well but I would like to make sure he tolerates the Enhertu without any unusual side effects before adding the [place] so the first member dose will be on 10/20/2018.  We reviewed the possible toxicities side effects and complications of that drug as well  He is very eager to get on with this treatment.  He will see me a week after his first Enhertu dose test to make sure everything is going well.  He knows to call for any other issue that may develop before the next visit.  Total encounter time 35 minutes.Sarajane Jews C. Beatric Fulop, MD 09/26/19 9:50 AM Medical Oncology and Hematology [facility] [place], [place] [address] Tel. [phone]    Fax. [phone]   I, Wilburn Mylar, am acting as scribe for Dr. Virgie Dad. Meeya Goldin.  I, Lurline Del MD, have reviewed the above documentation for accuracy and completeness, and I agree with the above.   *Total Encounter Time as defined by the Centers for Medicare and Medicaid Services includes, in addition to the face-to-face time of a patient visit (documented in the note above) non-face-to-face time: obtaining and reviewing outside history, ordering and reviewing medications, tests or procedures, care coordination (communications with other health care professionals or caregivers) and documentation in the medical record.

## 2019-09-26 ENCOUNTER — Inpatient Hospital Stay: Payer: Medicare PPO | Attending: Oncology | Admitting: Oncology

## 2019-09-26 ENCOUNTER — Other Ambulatory Visit: Payer: Self-pay

## 2019-09-26 VITALS — BP 113/85 | HR 71 | Temp 98.5°F | Resp 18 | Ht 64.5 in | Wt 144.8 lb

## 2019-09-26 DIAGNOSIS — C160 Malignant neoplasm of cardia: Secondary | ICD-10-CM

## 2019-09-26 DIAGNOSIS — M48061 Spinal stenosis, lumbar region without neurogenic claudication: Secondary | ICD-10-CM | POA: Insufficient documentation

## 2019-09-26 DIAGNOSIS — C162 Malignant neoplasm of body of stomach: Secondary | ICD-10-CM | POA: Diagnosis present

## 2019-09-26 DIAGNOSIS — Z7189 Other specified counseling: Secondary | ICD-10-CM | POA: Diagnosis not present

## 2019-09-26 DIAGNOSIS — I700 Atherosclerosis of aorta: Secondary | ICD-10-CM | POA: Diagnosis not present

## 2019-09-26 DIAGNOSIS — C169 Malignant neoplasm of stomach, unspecified: Secondary | ICD-10-CM | POA: Diagnosis not present

## 2019-09-26 DIAGNOSIS — R1909 Other intra-abdominal and pelvic swelling, mass and lump: Secondary | ICD-10-CM | POA: Diagnosis not present

## 2019-09-26 DIAGNOSIS — C787 Secondary malignant neoplasm of liver and intrahepatic bile duct: Secondary | ICD-10-CM | POA: Diagnosis not present

## 2019-09-26 DIAGNOSIS — C799 Secondary malignant neoplasm of unspecified site: Secondary | ICD-10-CM

## 2019-09-26 DIAGNOSIS — C61 Malignant neoplasm of prostate: Secondary | ICD-10-CM | POA: Diagnosis not present

## 2019-09-26 DIAGNOSIS — Z79899 Other long term (current) drug therapy: Secondary | ICD-10-CM | POA: Insufficient documentation

## 2019-09-26 DIAGNOSIS — R970 Elevated carcinoembryonic antigen [CEA]: Secondary | ICD-10-CM | POA: Insufficient documentation

## 2019-09-26 DIAGNOSIS — Z5112 Encounter for antineoplastic immunotherapy: Secondary | ICD-10-CM | POA: Insufficient documentation

## 2019-09-26 DIAGNOSIS — C7889 Secondary malignant neoplasm of other digestive organs: Secondary | ICD-10-CM | POA: Diagnosis not present

## 2019-09-29 ENCOUNTER — Other Ambulatory Visit: Payer: Self-pay | Admitting: Oncology

## 2019-09-29 DIAGNOSIS — Z7189 Other specified counseling: Secondary | ICD-10-CM

## 2019-09-29 DIAGNOSIS — C787 Secondary malignant neoplasm of liver and intrahepatic bile duct: Secondary | ICD-10-CM

## 2019-09-30 ENCOUNTER — Other Ambulatory Visit: Payer: Self-pay

## 2019-09-30 ENCOUNTER — Encounter: Payer: Self-pay | Admitting: Oncology

## 2019-09-30 ENCOUNTER — Inpatient Hospital Stay: Payer: Medicare PPO

## 2019-09-30 ENCOUNTER — Other Ambulatory Visit: Payer: Self-pay | Admitting: Oncology

## 2019-09-30 VITALS — BP 127/75 | HR 62 | Temp 98.6°F | Resp 16

## 2019-09-30 DIAGNOSIS — I700 Atherosclerosis of aorta: Secondary | ICD-10-CM | POA: Diagnosis not present

## 2019-09-30 DIAGNOSIS — M48061 Spinal stenosis, lumbar region without neurogenic claudication: Secondary | ICD-10-CM | POA: Diagnosis not present

## 2019-09-30 DIAGNOSIS — C787 Secondary malignant neoplasm of liver and intrahepatic bile duct: Secondary | ICD-10-CM | POA: Diagnosis not present

## 2019-09-30 DIAGNOSIS — C799 Secondary malignant neoplasm of unspecified site: Secondary | ICD-10-CM

## 2019-09-30 DIAGNOSIS — C168 Malignant neoplasm of overlapping sites of stomach: Secondary | ICD-10-CM

## 2019-09-30 DIAGNOSIS — D649 Anemia, unspecified: Secondary | ICD-10-CM

## 2019-09-30 DIAGNOSIS — C169 Malignant neoplasm of stomach, unspecified: Secondary | ICD-10-CM

## 2019-09-30 DIAGNOSIS — R1909 Other intra-abdominal and pelvic swelling, mass and lump: Secondary | ICD-10-CM | POA: Diagnosis not present

## 2019-09-30 DIAGNOSIS — C61 Malignant neoplasm of prostate: Secondary | ICD-10-CM

## 2019-09-30 DIAGNOSIS — Z79899 Other long term (current) drug therapy: Secondary | ICD-10-CM | POA: Diagnosis not present

## 2019-09-30 DIAGNOSIS — Z5112 Encounter for antineoplastic immunotherapy: Secondary | ICD-10-CM | POA: Diagnosis not present

## 2019-09-30 DIAGNOSIS — C7889 Secondary malignant neoplasm of other digestive organs: Secondary | ICD-10-CM | POA: Diagnosis not present

## 2019-09-30 LAB — CBC WITH DIFFERENTIAL/PLATELET
Abs Immature Granulocytes: 0.03 10*3/uL (ref 0.00–0.07)
Basophils Absolute: 0 10*3/uL (ref 0.0–0.1)
Basophils Relative: 1 %
Eosinophils Absolute: 0.2 10*3/uL (ref 0.0–0.5)
Eosinophils Relative: 4 %
HCT: 29.5 % — ABNORMAL LOW (ref 39.0–52.0)
Hemoglobin: 8.8 g/dL — ABNORMAL LOW (ref 13.0–17.0)
Immature Granulocytes: 1 %
Lymphocytes Relative: 24 %
Lymphs Abs: 1 10*3/uL (ref 0.7–4.0)
MCH: 25.3 pg — ABNORMAL LOW (ref 26.0–34.0)
MCHC: 29.8 g/dL — ABNORMAL LOW (ref 30.0–36.0)
MCV: 84.8 fL (ref 80.0–100.0)
Monocytes Absolute: 0.6 10*3/uL (ref 0.1–1.0)
Monocytes Relative: 13 %
Neutro Abs: 2.5 10*3/uL (ref 1.7–7.7)
Neutrophils Relative %: 57 %
Platelets: 274 10*3/uL (ref 150–400)
RBC: 3.48 MIL/uL — ABNORMAL LOW (ref 4.22–5.81)
RDW: 21.1 % — ABNORMAL HIGH (ref 11.5–15.5)
WBC: 4.3 10*3/uL (ref 4.0–10.5)
nRBC: 0 % (ref 0.0–0.2)

## 2019-09-30 LAB — COMPREHENSIVE METABOLIC PANEL
ALT: 25 U/L (ref 0–44)
AST: 33 U/L (ref 15–41)
Albumin: 3.2 g/dL — ABNORMAL LOW (ref 3.5–5.0)
Alkaline Phosphatase: 171 U/L — ABNORMAL HIGH (ref 38–126)
Anion gap: 9 (ref 5–15)
BUN: 16 mg/dL (ref 8–23)
CO2: 27 mmol/L (ref 22–32)
Calcium: 8.6 mg/dL — ABNORMAL LOW (ref 8.9–10.3)
Chloride: 105 mmol/L (ref 98–111)
Creatinine, Ser: 1.07 mg/dL (ref 0.61–1.24)
GFR calc Af Amer: >60 mL/min (ref >60)
GFR calc non Af Amer: >60 mL/min (ref >60)
Glucose, Bld: 109 mg/dL — ABNORMAL HIGH (ref 70–99)
Potassium: 4.1 mmol/L (ref 3.5–5.1)
Sodium: 141 mmol/L (ref 135–145)
Total Bilirubin: 0.4 mg/dL (ref 0.3–1.2)
Total Protein: 6.6 g/dL (ref 6.5–8.1)

## 2019-09-30 LAB — CEA (IN HOUSE-CHCC): CEA (CHCC-In House): 49.68 ng/mL — ABNORMAL HIGH (ref 0.00–5.00)

## 2019-09-30 LAB — TSH: TSH: 0.607 u[IU]/mL (ref 0.320–4.118)

## 2019-09-30 MED ORDER — HEPARIN SOD (PORK) LOCK FLUSH 100 UNIT/ML IV SOLN
500.0000 [IU] | Freq: Once | INTRAVENOUS | Status: AC | PRN
  Administered 2019-09-30: 500 [IU]
  Filled 2019-09-30: qty 5

## 2019-09-30 MED ORDER — DIPHENHYDRAMINE HCL 25 MG PO CAPS
25.0000 mg | ORAL_CAPSULE | Freq: Once | ORAL | Status: AC
  Administered 2019-09-30: 25 mg via ORAL

## 2019-09-30 MED ORDER — SODIUM CHLORIDE 0.9 % IV SOLN
Freq: Once | INTRAVENOUS | Status: AC
  Filled 2019-09-30: qty 250

## 2019-09-30 MED ORDER — ACETAMINOPHEN 325 MG PO TABS
650.0000 mg | ORAL_TABLET | Freq: Once | ORAL | Status: AC
  Administered 2019-09-30: 650 mg via ORAL

## 2019-09-30 MED ORDER — SODIUM CHLORIDE 0.9% FLUSH
10.0000 mL | INTRAVENOUS | Status: DC | PRN
  Administered 2019-09-30: 10 mL
  Filled 2019-09-30: qty 10

## 2019-09-30 MED ORDER — SODIUM CHLORIDE 0.9 % IV SOLN
3.4000 mg/kg | Freq: Once | INTRAVENOUS | Status: AC
  Administered 2019-09-30: 220 mg via INTRAVENOUS
  Filled 2019-09-30: qty 8

## 2019-09-30 MED ORDER — ACETAMINOPHEN 325 MG PO TABS
ORAL_TABLET | ORAL | Status: AC
  Filled 2019-09-30: qty 2

## 2019-09-30 MED ORDER — DIPHENHYDRAMINE HCL 25 MG PO CAPS
ORAL_CAPSULE | ORAL | Status: AC
  Filled 2019-09-30: qty 1

## 2019-09-30 NOTE — Progress Notes (Signed)
[place]  [phone] [phone]     ID: Peter Wood DOB: [birth year]  MR#: [id number]  TMH#:[id number]  Patient Care Team: Cassandria Anger, MD as PCP - General Milus Banister, MD as Attending Physician (Gastroenterology) Magrinat, Virgie Dad, MD as Consulting Physician (Oncology) Gatha Mayer, MD as Consulting Physician (Gastroenterology) [name], MD as Consulting Physician (Ophthalmology) Ramon Dredge, MD as Referring Physician (Radiation Oncology) Larey Dresser, MD as Consulting Physician (Cardiology) OTHER MD:   CHIEF COMPLAINT: Metastatic gastric adenocarcinoma  CURRENT TREATMENT: To start pembrolizumab, Enhertu   INTERVAL HISTORY: Peter Wood returns today for follow-up and treatment of his metastatic gastric adenocarcinoma.   He was restarted on FOLFOX due to progression on anti-HER-2 treatment only.  He started treatment on 07/29/2019 to be given on day 1 of a 14 day cycle.  Today is cycle 4 day 1 of treatment.    He continues on trastuzumab with good tolerance. He receives this every 2 weeks.    His most recent echocardiogram on 08/25/2019 showed an ejection fraction of 60-65%.  He has a new patient appointment with Dr. Haroldine Laws on 09/22/2019.    Unfortunately since his last visit, he underwent restaging abdomen/pelvis CT yesterday, 09/08/2019, which revealed: enlarging and increasingly necrotic hepatic metastases; grossly stable probable distal gastric mass and adjacent adenopathy; stable small pericardial effusion.   REVIEW OF SYSTEMS: Yu continues to feel and generally do well.  He has a good appetite, no nausea or vomiting, and no pain.  Christmas was quiet just him and his wife and they are taking appropriate pandemic precautions.  He has tolerated treatment well.  He has had no bleeding problems from the Eliquis.  A detailed review of systems today was otherwise stable.   HISTORY OF CURRENT  ILLNESS: From the original intake note:  Peter Wood presented to the emergency room 09/07/2018 with mid/upper abdominal pain.  He reported weight loss of 25 pounds over the past 6 months and significant anorexia.  His liver function tests were abnormal and a right upper quadrant ultrasound was obtained.  This was read as concerning for malignancy and a CT of the abdomen and pelvis with contrast was obtained the same day, showing  1. Too numerous to count hypodense masses of the liver concerning for hepatic metastasis. In the region of the porta hepatis, there are hypodense lesions more likely associated with the liver though the pancreatic head is partially obscured by a 3.2 cm hypodense mass. Findings are more likely associated with the liver and less likely an isolated pancreatic mass. 2. Upper abdominal mesenteric and peripancreatic lymphadenopathy suspicious for metastatic disease. 3. 6 mm sclerotic density in the left iliac bone adjacent to the SI joint. Given history of prostate cancer, differential considerations would include osteoblastic metastasis or possibly bone island. Showed only diverticular disease.  He was referred to GI and Dr. Ardis Hughs notes a colonoscopy performed 04/23/2017 makes the possibility of colon cancer unlikely.  Further work-up was planned, but on 09/19/2018 the patient again presented to the emergency room with chest pain, now in a different area, and a staging CT scan of the chest showed filling defects in the right lower lobe pulmonary arteries consistent with embolism.  There was a small pericardial effusion and an additional 2.4 cm left thyroid mass.  Aortic atherosclerosis was noted  He was started on intravenous heparin and on 09/20/2018 underwent upper endoscopy under Dr. Carlean Purl showing a giant nonbleeding cratered gastric ulcer in  the posterior wall of the stomach.  Biopsies of this procedure showed (SZA 20-187) adenocarcinoma.   At that point we were  consulted and we obtained tumor markers which included a CEA of 1318.2, a CA 19-9 of 67,479, a normal AFP and a normal PSA.  I discussed the case with pathology and they do not feel that additional testing would be helpful in terms of discriminating between a primary gastric and a primary biliary/pancreatic tumor.  The patient's subsequent history is as detailed below.   PAST MEDICAL HISTORY: Past Medical History:  Diagnosis Date  . Arthritis   . Diverticulosis of colon   . Family history of breast cancer   . Family history of prostate cancer   . HTN (hypertension)   . Hx of colonic polyp   . Pneumonia    as a child/baby  . Poor circulation of extremity    right leg  . Prostate cancer ([place]) 2015   prostate cancer - radiation treated     PAST SURGICAL HISTORY: Past Surgical History:  Procedure Laterality Date  . [place] VITRECTOMY WITH 20 GAUGE MVR PORT FOR MACULAR HOLE Right 09/19/2016   Procedure: 25 GAUGE PARS PLANA VITRECTOMY WITH 20 GAUGE MVR PORT FOR MACULAR HOLE, MEMBRANE PEEL, SERUM PATCH, GAS FLUID EXCHANGE, HEADSCOPE LASER;  Surgeon: [name], MD;  Location: [name];  Service: Ophthalmology;  Laterality: Right;  . BIOPSY  09/20/2018   Procedure: BIOPSY;  Surgeon: Gatha Mayer, MD;  Location: [facility] ENDOSCOPY;  Service: Endoscopy;;  . CATARACT EXTRACTION  08/27/12   Right  . COLONOSCOPY    . ESOPHAGOGASTRODUODENOSCOPY (EGD) WITH PROPOFOL N/A 09/20/2018   Procedure: ESOPHAGOGASTRODUODENOSCOPY (EGD) WITH PROPOFOL;  Surgeon: Gatha Mayer, MD;  Location: [facility];  Service: Endoscopy;  Laterality: N/A;  . HAND SURGERY  2016   right thumb  . IR IMAGING GUIDED PORT INSERTION  11/04/2018  . KNEE SURGERY Left 1989     FAMILY HISTORY: Family History  Problem Relation Age of Onset  . Prostate cancer Father        dx >50  . Hypertension Mother   . Ulcers Mother   . Breast cancer Sister        dx 20's  . Prostate cancer Brother        dx under 62,  metasttic, cause of his death  . Ulcers Brother   . Ulcers Sister    Peter Wood's father died from heart complications with a pacemaker at age 35; Dann's father also has prostate cancer. Patients' mother died from natural causes at age 54. The patient has 4 brothers and 3 sisters. One of [place] sisters had breast cancer in her 59s and a brother had prostate cancer. Patient denies anyone in her family having ovarian, or pancreatic cancer. Khalin mentions that his youngest sister and his brother with prostate cancer also had stomach ulcers.    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 His wife, [name], is a retired [employer] summer Radiographer, therapeutic. As of 09/2018, they have been married for 56 years. They have 4 children, Claiborne Billings, [place], [place], and Joshua. Claiborne Billings lives in [place] and works at the [employer]. Sherrod lives in [place] and works at [employer] and as a part Horticulturist, commercial. Germane lives in [place] and is a Engineer, drilling for a medical company. Beverely Low lives in [place] and works at [employer]. Rui has 7 grandchildren and 1 step great grandchild. He attends [employer].  ADVANCED DIRECTIVES: His wife, [name], is automatically his healthcare power of attorney.     HEALTH MAINTENANCE: Social History   Tobacco Use  . Smoking status: Former Smoker    Years: 4.00    Quit date: 09/12/1967    Years since quitting: 52.0  . Smokeless tobacco: Never Used  Substance Use Topics  . Alcohol use: No  . Drug use: No    Colonoscopy: 2018/[facility]  PSA: 0.54 on 09/18/2018  No Known Allergies  Current Outpatient Medications  Medication Sig Dispense Refill  . amLODipine (NORVASC) 5 MG tablet TAKE ONE TABLET BY MOUTH ONE TIME DAILY  90 tablet 2  . apixaban (ELIQUIS) 2.5 MG TABS tablet Take 1 tablet (2.5 mg total) by mouth 2 (two) times daily. 60 tablet 11  . benazepril (LOTENSIN) 40 MG tablet TAKE ONE TABLET BY MOUTH ONE TIME DAILY  90 tablet 2  .  Cholecalciferol 1000 UNITS tablet Take 1,000 Units by mouth daily.     Marland Kitchen latanoprost (XALATAN) 0.005 % ophthalmic solution Place 1 drop into both eyes at bedtime.     . lidocaine-prilocaine (EMLA) cream Apply to affected area once 30 g 3  . ondansetron (ZOFRAN) 4 MG tablet Take 1 tablet (4 mg total) by mouth every 8 (eight) hours as needed for nausea or vomiting. 20 tablet 1   No current facility-administered medications for this visit.   Facility-Administered Medications Ordered in Other Visits  Medication Dose Route Frequency Provider Last Rate Last Admin  . sodium chloride flush (NS) 0.9 % injection 10 mL  10 mL Intravenous PRN Tomasita Beevers, Virgie Dad, MD         OBJECTIVE: Older African-American in no acute distress There were no vitals filed for this visit. Wt Readings from Last 3 Encounters:  09/26/19 144 lb 12.8 oz (65.7 kg)  09/22/19 146 lb 3.2 oz (66.3 kg)  09/09/19 147 lb 11.2 oz (67 kg)   There is no height or weight on file to calculate BMI.    ECOG FS:1 - Symptomatic but completely ambulatory  Sclerae unicteric, EOMs intact Wearing a mask No cervical or supraclavicular adenopathy Lungs no rales or rhonchi Heart regular rate and rhythm Abd soft, nontender, positive bowel sounds MSK no focal spinal tenderness, no upper extremity lymphedema Neuro: nonfocal, well oriented, appropriate affect  LAB RESULTS:  CMP     Component Value Date/Time   NA 140 09/09/2019 1005   K 3.6 09/09/2019 1005   CL 104 09/09/2019 1005   CO2 27 09/09/2019 1005   GLUCOSE 112 (H) 09/09/2019 1005   GLUCOSE 105 (H) 09/13/2006 0900   BUN 11 09/09/2019 1005   CREATININE 1.09 09/09/2019 1005   CREATININE 1.02 10/16/2018 0936   CALCIUM 8.8 (L) 09/09/2019 1005   PROT 6.2 (L) 09/09/2019 1005   ALBUMIN 3.5 09/09/2019 1005   AST 34 09/09/2019 1005   AST 76 (H) 10/16/2018 0936   ALT 26 09/09/2019 1005   ALT 47 (H) 10/16/2018 0936   ALKPHOS 106 09/09/2019 1005   BILITOT 0.8 09/09/2019 1005    BILITOT 0.7 10/16/2018 0936   GFRNONAA >60 09/09/2019 1005   GFRNONAA >60 10/16/2018 0936   GFRAA >60 09/09/2019 1005   GFRAA >60 10/16/2018 0936    No results found for: TOTALPROTELP, ALBUMINELP, A1GS, A2GS, BETS, BETA2SER, GAMS, MSPIKE, SPEI  No results found for: KPAFRELGTCHN, LAMBDASER, KAPLAMBRATIO  Lab Results  Component Value Date   WBC 2.3 (L) 09/09/2019   NEUTROABS 1.0 (L) 09/09/2019   HGB 8.9 (L)  09/09/2019   HCT 29.3 (L) 09/09/2019   MCV 82.1 09/09/2019   PLT 145 (L) 09/09/2019    No results found for: LABCA2  No components found for: [id number]  No results for input(s): INR in the last 168 hours.  No results found for: LABCA2  Lab Results  Component Value Date   CAN199 50 (H) 09/09/2019    No results found for: VOJ500  No results found for: XFG182  No results found for: CA2729  No components found for: HGQUANT  Lab Results  Component Value Date   CEA1 50.17 (H) 09/09/2019   /  CEA ([facility])  Date Value Ref Range Status  09/09/2019 50.17 (H) 0.00 - 5.00 ng/mL Final    Comment:    (NOTE) This test was performed using Architect's Chemiluminescent Microparticle Immunoassay. Values obtained from different assay methods cannot be used interchangeably. Please note that 5-10% of patients who smoke may see CEA levels up to 6.9 ng/mL. Performed at [facility], [place] [address] [place], [place] [address]      No results found for: AFPTUMOR  No results found for: CHROMOGRNA  No results found for: HGBA, HGBA2QUANT, HGBFQUANT, HGBSQUAN (Hemoglobinopathy evaluation)   No results found for: LDH  Lab Results  Component Value Date   IRON 38 (L) 09/21/2018   TIBC 251 09/21/2018   IRONPCTSAT 15 (L) 09/21/2018   (Iron and TIBC)  Lab Results  Component Value Date   FERRITIN 138 09/21/2018    Urinalysis    Component Value Date/Time   COLORURINE YELLOW 02/18/2019 1056   APPEARANCEUR CLEAR 02/18/2019 1056    LABSPEC 1.018 02/18/2019 1056   PHURINE 6.0 02/18/2019 1056   GLUCOSEU NEGATIVE 02/18/2019 [place] 07/20/2017 1056   HGBUR NEGATIVE 02/18/2019 [place] 02/18/2019 [place] 02/18/2019 1056   PROTEINUR 30 (A) 02/18/2019 1056   UROBILINOGEN 0.2 07/20/2017 1056   NITRITE NEGATIVE 02/18/2019 [place] 02/18/2019 1056     STUDIES:  CT Abdomen Pelvis W Contrast  Result Date: 09/08/2019 CLINICAL DATA:  Gastrointestinal cancer, staging. Prostate cancer metastatic to stomach and liver. Ongoing chemotherapy. EXAM: CT ABDOMEN AND PELVIS WITH CONTRAST TECHNIQUE: Multidetector CT imaging of the abdomen and pelvis was performed using the standard protocol following bolus administration of intravenous contrast. CONTRAST:  185m OMNIPAQUE IOHEXOL 300 MG/ML  SOLN COMPARISON:  07/16/2019 and 09/07/2018. FINDINGS: Lower chest: Scarring in the lung bases. Nodular density in the subpleural medial right lower lobe measures 4 mm (4/3), unchanged from 09/07/2018. Heart is at the upper limits of normal in size. Small pericardial effusion, stable. No pleural fluid. Hepatobiliary: Heterogeneous nodules and masses in the liver are somewhat difficult to measure. Index mass in the right hepatic lobe measures 3.8 x 4.2 cm (2/9), increased from 3.2 x 4.2 cm on 07/16/2019 and is increasingly necrotic centrally. Index lesion in the left hepatic lobe (segment 4B) has enlarged as well, now measuring 3.0 x 3.2 cm (2/18), compared to 1.5 x 2.1 cm on 07/16/2019. This lesion also appears more centrally necrotic. Gallbladder is unremarkable. No biliary ductal dilatation. Pancreas: Negative. Spleen: Negative. Adrenals/Urinary Tract: Adrenal glands are unremarkable. Subcentimeter low-attenuation lesions in the kidneys are too small to characterize but statistically, cysts are likely. Ureters are decompressed. Bladder is grossly unremarkable. Stomach/Bowel: There may be  wall thickening at the gastroesophageal junction as well as within the mid and distal stomach. Suspect a discrete mass in the gastric antrum,  measuring 1.4 x 2.6 cm (coronal image 13), similar. Small bowel and appendix are otherwise unremarkable. Stool is seen in the majority of the colon, indicative of constipation. Vascular/Lymphatic: Atherosclerotic calcification of the aorta. 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 Adenopathy inferior to the gastric antrum measures approximately 1.5 cm (2/26), similar. Otherwise, no pathologically enlarged lymph nodes. Reproductive: Prostate is enlarged and contains brachytherapy seeds. Other: No free fluid. Mesenteries and peritoneum are otherwise unremarkable. Musculoskeletal: No worrisome lytic or sclerotic lesions. Degenerative disc disease and disc space narrowing at L3-4 and L5-S1. IMPRESSION: 1. Enlarging and increasingly necrotic hepatic metastases. 2. Probable distal gastric mass and adjacent adenopathy, grossly stable. 3. Small pericardial effusion, stable. 4. Enlarged prostate. 5. Aortic atherosclerosis (ICD10-170.0). 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 Electronically Signed   By: Lorin Picket M.D.   On: 09/08/2019 10:01     ELIGIBLE FOR AVAILABLE RESEARCH PROTOCOL: no   ASSESSMENT: 85 [place], [place] man status post gastric biopsy 09/20/2018 showing adenocarcinoma, with a CA 19-9 greater than 65,000; staging studies showing an apparent mass adjacent to the head of the pancreas, innumerable liver lesions, upper abdominal mesenteric and peripancreatic lymphadenopathy, and an isolated sclerotic density in the left iliac bone  (a) genomics requested on gastric biopsy showed the tumor to be HER-2 amplified at 3+, PD-L1 positive, and T p53 mutated.  MSI is stable, mismatch repair status is proficient and the tumor mutational burden is intermediate.  (1) chest CT scan 09/19/2018 shows  right lower lobe pulmonary embolism  (a) on intravenous heparin started 09/19/2018, transitioned to apixaban 10/01/2018  (2) genetics testing 10/04/2018: negative  (a) family history of prostate and early breast cancer; patient has a history of prostate cancer  (3) started gemcitabine/Abraxane 10/09/2018, repeated days 1, 8 and 15 of each 28-day cycle  (a) stopped after 1 cycle (3 doses) with reassessment of diagnosis based on CARIS results, noted above  (4) started oxaliplatin, capecitabine and trastuzumab 11/06/2018  (a) baseline echocardiogram 09/20/2018 shows an ejection fraction in the 55-60% range  (b) oxaliplatin and trastuzumab every 21 days started 11/06/2018  (c) capecitabine dose 1000 mg twice daily for 14 days of every 21-day cycle  (d) CT of the abdomen and pelvis 02/11/2019 shows marked improvement in his liver lesions and resolution of periportal adenopathy  (e) tumor markers also show significant response,  (f) echocardiogram 02/12/2019 shows an ejection fraction in the 60-65% range  (g) CT abd/pelvis show stable/ improved disease, tumor markers nearly normal  (h) oxaliplatin stopped aftwer 04/30/2019 dose  (i) capecitabine stopped after September cycle   (5) trastuzumab maintenance started 05/20/2019  (6) disease progression documented by lab work and CT scan 07/16/2019  (a) resumed FOLFOX 07/29/2019, continuing trastuzumab (every 14 days).  (b) restaging studies after 3 cycles showed evidence of progression (tumor markers equivocal)  (7) to start pembrolizumab 09/15/2018, repeat every 6 weeks  (a) plan to add Enhertu 09/30/2019   (b) echocardiogram 08/25/2019 showed an ejection fraction in the 60-65% range PLAN: Arda tolerated FOLFOX/trastuzumab generally quite well.  His tumor marker was trending up and then went down a bit.  However the CT scan shows evidence of growth in the liver lesions and that trumps the tumor marker, even though some of those lesions does  show central necrosis.  We need to change therapy and we certainly have many options.  We could do FOLFIRI, and normally we would add a VEGF inhibitor at that point but since  he is on Eliquis I would be hesitant to do that.  We could go to other chemotherapy combinations together with trastuzumab.  I think it would be worth while making use of the fact that his tumor is PD-L1 positive.  I am going to start him on pembrolizumab next week.  We will do the every 6-week dose to minimize visits.  Today we discussed the possible toxicities side effects and complications of this agent and I will start monitoring his thyroid function.  We can then add Enhertu to target the HER-2 positivity of his tumor.  We will discussed the possible toxicities side effects and complications of this agent more in detail when he returns to see me at the time of the first dose.  Note he just had an echo 08/25/2019 showing a well-preserved ejection fraction.  Mr. Geiger has a good understanding of this plan.  He knows to call us for any problem that may develop before the next visit here.  Virgie Dad. Hayven Fatima, MD 09/30/19 8:05 AM Medical Oncology and Hematology [facility] [place], [place] [address] Tel. [phone]    Fax. [phone]   I, Wilburn Mylar, am acting as scribe for Dr. Virgie Dad. Willet Schleifer.  I, Lurline Del MD, have reviewed the above documentation for accuracy and completeness, and I agree with the above.  Results from First Combination Trial of ENHERTU and Immune Checkpoint Inhibitor in Patients with HER2 Expressing Metastatic Breast Cancer Presented at the [place] Symposium Initial results demonstrate ENHERTU may be safely combined with nivolumab, an immune checkpoint inhibitor in patients with HER2 positive or HER2 low metastatic breast cancer Additional immunotherapy combination trials underway with [facility] to determine optimal combination  strategies in these settings August 21, 2019 02:00 AM [place] Standard Time

## 2019-09-30 NOTE — Progress Notes (Unsigned)
I had a peer to peer today with Mr. [employer].  They are willing to approve the Kadcyla but not the pembrolizumab.  We will try to obtain the latter separately.  Incidentally and HER-2 was just approved I believe this morning and if the Kadcyla does not work we should have no trouble switching [place] to that which was actually our first choice.

## 2019-09-30 NOTE — Progress Notes (Signed)
First time Kadcyla today. Pt tolerated well. No complaints. Education done, information given.

## 2019-09-30 NOTE — Patient Instructions (Signed)
[facility] Discharge Instructions for Patients Receiving Chemotherapy  Today you received the following chemotherapy agents: Kadcyla  To help prevent nausea and vomiting after your treatment, we encourage you to take your nausea medication as directed.    If you develop nausea and vomiting that is not controlled by your nausea medication, call the clinic.   BELOW ARE SYMPTOMS THAT SHOULD BE REPORTED IMMEDIATELY:  *FEVER GREATER THAN 100.5 F  *CHILLS WITH OR WITHOUT FEVER  NAUSEA AND VOMITING THAT IS NOT CONTROLLED WITH YOUR NAUSEA MEDICATION  *UNUSUAL SHORTNESS OF BREATH  *UNUSUAL BRUISING OR BLEEDING  TENDERNESS IN MOUTH AND THROAT WITH OR WITHOUT PRESENCE OF ULCERS  *URINARY PROBLEMS  *BOWEL PROBLEMS  UNUSUAL RASH Items with * indicate a potential emergency and should be followed up as soon as possible.  Feel free to call the clinic you have any questions or concerns. The clinic phone number is [phone].  Please show the CHEMO ALERT CARD at check-in to the Emergency Department and triage nurse.   

## 2019-09-30 NOTE — Progress Notes (Signed)
Discussed plan with Dr. Jana Hakim - Patient will start [place] today. Rob is working on patient assistance forms for him today and will get this process started. Pembrolizumab PA is still pending, but this medication was not going to be started until next Kadcyla cycle. PA team is working on this currently.   Demetrius Charity, PharmD, [facility] Pharmacist Pharmacy Phone: [phone] 09/30/2019

## 2019-10-01 LAB — CANCER ANTIGEN 19-9: CA 19-9: 40 U/mL — ABNORMAL HIGH (ref 0–35)

## 2019-10-06 NOTE — Progress Notes (Signed)
[place]  [phone] Fax:([phone]     ID: Peter Wood DOB: 05-16-1935  MR#: [id number]  KVQ#:[id number]  Patient Care Team: Peter Anger, MD as PCP - General Peter Banister, MD as Attending Physician (Gastroenterology) Peter Wood, Virgie Dad, MD as Consulting Physician (Oncology) Peter Mayer, MD as Consulting Physician (Gastroenterology) Peter Pedro, MD as Consulting Physician (Ophthalmology) Peter Dredge, MD as Referring Physician (Radiation Oncology) Peter Dresser, MD as Consulting Physician (Cardiology) OTHER MD:   CHIEF COMPLAINT: Metastatic gastric adenocarcinoma  CURRENT TREATMENT: pembrolizumab, T-DM1   INTERVAL HISTORY: Peter Wood returns today for follow-up and treatment of his metastatic gastric adenocarcinoma accompanied by his wife Peter Wood.   His most recent echocardiogram on 08/25/2019 showed an ejection fraction of 60-65%.    He was started on Kadcyla on 09/29/2018.  I had originally written for Enhertu but the insurance was not willing to approve that.  After much discussion with the insurance Peter Wood agreed that we would give Kadcyla a try and if there is no significant improvement we would move to Enhertu, which incidentally has just been approved in this setting.  He is scheduled to have pembrolizumab with his next dose of Kadcyla, 10/21/2019.  He is here to discuss that in more detail   REVIEW OF SYSTEMS: Peter Wood tolerated the Kadcyla generally well.  He did feel tired for a couple of days.  He was able to do all his daily activities it was just a little bit more of an effort.  He is now "okay".  As far as the stomach cancer itself is concerned she he has a good appetite, normal taste, no change in weight, and normal bowel movements.  He is taking elderberry tablets in a preparation that also includes zinc and vitamin C.  He signed up for the vaccine but then got canceled.  They are keeping appropriate  pandemic precautions.  Detailed review of systems was otherwise stable.   HISTORY OF CURRENT ILLNESS: From the original intake note:  Peter Wood presented to the emergency room 09/07/2018 with mid/upper abdominal pain.  He reported weight loss of 25 pounds over the past 6 months and significant anorexia.  His liver function tests were abnormal and a right upper quadrant ultrasound was obtained.  This was read as concerning for malignancy and a CT of the abdomen and pelvis with contrast was obtained the same day, showing  1. Too numerous to count hypodense masses of the liver concerning for hepatic metastasis. In the region of the porta hepatis, there are hypodense lesions more likely associated with the liver though the pancreatic head is partially obscured by a 3.2 cm hypodense mass. Findings are more likely associated with the liver and less likely an isolated pancreatic mass. 2. Upper abdominal mesenteric and peripancreatic lymphadenopathy suspicious for metastatic disease. 3. 6 mm sclerotic density in the left iliac bone adjacent to the SI joint. Given history of prostate cancer, differential considerations would include osteoblastic metastasis or possibly bone island. Showed only diverticular disease.  He was referred to GI and Dr. Ardis Wood notes a colonoscopy performed 04/23/2017 makes the possibility of colon cancer unlikely.  Further work-up was planned, but on 09/19/2018 the patient again presented to the emergency room with chest pain, now in a different area, and a staging CT scan of the chest showed filling defects in the right lower lobe pulmonary arteries consistent with embolism.  There was a small pericardial effusion and an additional 2.4 cm  left thyroid mass.  Aortic atherosclerosis was noted  He was started on intravenous heparin and on 09/20/2018 underwent upper endoscopy under Dr. Carlean Wood showing a giant nonbleeding cratered gastric ulcer in the posterior wall of the stomach.   Biopsies of this procedure showed (SZA 20-187) adenocarcinoma.   At that point we were consulted and we obtained tumor markers which included a CEA of 1318.2, a CA 19-9 of 67,479, a normal AFP and a normal PSA.  I discussed the case with pathology and they do not feel that additional testing would be helpful in terms of discriminating between a primary gastric and a primary biliary/pancreatic tumor.  The patient's subsequent history is as detailed below.   PAST MEDICAL HISTORY: Past Medical History:  Diagnosis Date  . Arthritis   . Diverticulosis of colon   . Family history of breast cancer   . Family history of prostate cancer   . HTN (hypertension)   . Hx of colonic polyp   . Pneumonia    as a child/baby  . Poor circulation of extremity    right leg  . Prostate cancer (Peter Wood) 2015   prostate cancer - radiation treated    PAST SURGICAL HISTORY: Past Surgical History:  Procedure Laterality Date  . [place] VITRECTOMY WITH 20 GAUGE MVR PORT FOR MACULAR HOLE Right 09/19/2016   Procedure: 25 GAUGE PARS PLANA VITRECTOMY WITH 20 GAUGE MVR PORT FOR MACULAR HOLE, MEMBRANE PEEL, SERUM PATCH, GAS FLUID EXCHANGE, HEADSCOPE LASER;  Surgeon: Peter Pedro, MD;  Location: [place];  Service: Ophthalmology;  Laterality: Right;  . BIOPSY  09/20/2018   Procedure: BIOPSY;  Surgeon: Peter Mayer, MD;  Location: [facility] ENDOSCOPY;  Service: Endoscopy;;  . CATARACT EXTRACTION  08/27/12   Right  . COLONOSCOPY    . ESOPHAGOGASTRODUODENOSCOPY (EGD) WITH PROPOFOL N/A 09/20/2018   Procedure: ESOPHAGOGASTRODUODENOSCOPY (EGD) WITH PROPOFOL;  Surgeon: Peter Mayer, MD;  Location: [place];  Service: Endoscopy;  Laterality: N/A;  . HAND SURGERY  2016   right thumb  . IR IMAGING GUIDED PORT INSERTION  11/04/2018  . KNEE SURGERY Left 1989    FAMILY HISTORY: Family History  Problem Relation Age of Onset  . Prostate cancer Father        dx >50  . Hypertension Mother   . Ulcers Mother   .  Breast cancer Sister        dx 20's  . Prostate cancer Brother        dx under 66, metasttic, cause of his death  . Ulcers Brother   . Ulcers Sister    [name]'s father died from heart complications with a pacemaker at age 69; Kelwin's father also has prostate cancer. Patients' mother died from natural causes at age 33. The patient has 4 brothers and 3 sisters. One of [place] sisters had breast cancer in her 100s and a brother had prostate cancer. Patient denies anyone in her family having ovarian, or pancreatic cancer. Dakwon mentions that his youngest sister and his brother with prostate cancer also had stomach ulcers.    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 His wife, Peter Wood, is a retired [employer] summer Radiographer, therapeutic. As of 09/2018, they have been married for 56 years. They have 4 children, Claiborne Billings, Agency, Globe, and Dulce. Claiborne Billings lives in [place] and works at the [employer]. Sherrod lives in [place] and works at [employer] and as a part Horticulturist, commercial. 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 Beverely Low lives in [place] and works at [employer]. Michaelpaul has 7 grandchildren and 1 step great grandchild. He attends [employer].    ADVANCED DIRECTIVES: His wife, Peter Wood, is automatically his healthcare power of attorney.     HEALTH MAINTENANCE: Social History   Tobacco Use  . Smoking status: Former Smoker    Years: 4.00    Quit date: 09/12/1967    Years since quitting: 52.1  . Smokeless tobacco: Never Used  Substance Use Topics  . Alcohol use: No  . Drug use: No    Colonoscopy: 2018/[facility]  PSA: 0.54 on 09/18/2018  No Known Allergies  Current Outpatient Medications  Medication Sig Dispense Refill  . amLODipine (NORVASC) 5 MG tablet TAKE ONE TABLET BY MOUTH ONE TIME DAILY  90 tablet 2  . apixaban (ELIQUIS) 2.5 MG TABS tablet Take 1 tablet (2.5 mg total) by mouth 2 (two) times daily. 60 tablet 11  .  benazepril (LOTENSIN) 40 MG tablet TAKE ONE TABLET BY MOUTH ONE TIME DAILY  90 tablet 2  . Cholecalciferol 1000 UNITS tablet Take 1,000 Units by mouth daily.     Marland Kitchen latanoprost (XALATAN) 0.005 % ophthalmic solution Place 1 drop into both eyes at bedtime.     . lidocaine-prilocaine (EMLA) cream Apply to affected area once 30 g 3  . ondansetron (ZOFRAN) 4 MG tablet Take 1 tablet (4 mg total) by mouth every 8 (eight) hours as needed for nausea or vomiting. 20 tablet 1   No current facility-administered medications for this visit.   Facility-Administered Medications Ordered in Other Visits  Medication Dose Route Frequency Provider Last Rate Last Admin  . sodium chloride flush (NS) 0.9 % injection 10 mL  10 mL Intravenous PRN Osvaldo Lamping, Virgie Dad, MD        OBJECTIVE: Older African-American no acute distress  Vitals:   10/07/19 1500  BP: (!) 150/78  Pulse: 64  Resp: 17  Temp: 98.5 F (36.9 C)  SpO2: 100%   Wt Readings from Last 3 Encounters:  10/07/19 144 lb (65.3 kg)  09/26/19 144 lb 12.8 oz (65.7 kg)  09/22/19 146 lb 3.2 oz (66.3 kg)   Body mass index is 24.34 kg/m.    ECOG FS:1 - Symptomatic but completely ambulatory  Sclerae unicteric, EOMs intact Wearing a mask No cervical or supraclavicular adenopathy Lungs no rales or rhonchi Heart regular rate and rhythm Abd soft, nontender, positive bowel sounds, no masses palpated MSK no focal spinal tenderness, no upper extremity lymphedema Neuro: nonfocal, well oriented, appropriate affect   LAB RESULTS:  CMP     Component Value Date/Time   NA 139 10/07/2019 1514   K 3.4 (L) 10/07/2019 1514   CL 102 10/07/2019 1514   CO2 28 10/07/2019 1514   GLUCOSE 85 10/07/2019 1514   GLUCOSE 105 (H) 09/13/2006 0900   BUN 16 10/07/2019 1514   CREATININE 1.07 10/07/2019 1514   CREATININE 1.02 10/16/2018 0936   CALCIUM 8.9 10/07/2019 1514   PROT 7.1 10/07/2019 1514   ALBUMIN 3.4 (L) 10/07/2019 1514   AST 66 (H) 10/07/2019 1514   AST  76 (H) 10/16/2018 0936   ALT 34 10/07/2019 1514   ALT 47 (H) 10/16/2018 0936   ALKPHOS 190 (H) 10/07/2019 1514   BILITOT 0.6 10/07/2019 1514   BILITOT 0.7 10/16/2018 0936   GFRNONAA >60 10/07/2019 1514   GFRNONAA >60 10/16/2018 0936   GFRAA >60 10/07/2019 1514   GFRAA >60 10/16/2018 1610  No results found for: TOTALPROTELP, ALBUMINELP, A1GS, A2GS, BETS, BETA2SER, GAMS, MSPIKE, SPEI  No results found for: KPAFRELGTCHN, LAMBDASER, KAPLAMBRATIO  Lab Results  Component Value Date   WBC 4.5 10/07/2019   NEUTROABS 2.3 10/07/2019   HGB 9.3 (L) 10/07/2019   HCT 30.5 (L) 10/07/2019   MCV 84.5 10/07/2019   PLT 118 (L) 10/07/2019    No results found for: LABCA2  No components found for: GYIRSW546  No results for input(s): INR in the last 168 hours.  No results found for: LABCA2  Lab Results  Component Value Date   CAN199 40 (H) 09/30/2019    No results found for: EVO350  No results found for: KXF818  No results found for: CA2729  No components found for: HGQUANT  Lab Results  Component Value Date   CEA1 49.68 (H) 09/30/2019   /  CEA ([facility])  Date Value Ref Range Status  09/30/2019 49.68 (H) 0.00 - 5.00 ng/mL Final    Comment:    (NOTE) This test was performed using Architect's Chemiluminescent Microparticle Immunoassay. Values obtained from different assay methods cannot be used interchangeably. Please note that 5-10% of patients who smoke may see CEA levels up to 6.9 ng/mL. Performed at [facility], [place] [address] [place], [place] [address]      No results found for: AFPTUMOR  No results found for: CHROMOGRNA  No results found for: HGBA, HGBA2QUANT, HGBFQUANT, HGBSQUAN (Hemoglobinopathy evaluation)   No results found for: LDH  Lab Results  Component Value Date   IRON 38 (L) 09/21/2018   TIBC 251 09/21/2018   IRONPCTSAT 15 (L) 09/21/2018   (Iron and TIBC)  Lab Results  Component Value Date   FERRITIN 138  09/21/2018    Urinalysis    Component Value Date/Time   COLORURINE YELLOW 02/18/2019 1056   APPEARANCEUR CLEAR 02/18/2019 1056   LABSPEC 1.018 02/18/2019 1056   PHURINE 6.0 02/18/2019 1056   GLUCOSEU NEGATIVE 02/18/2019 [place] 07/20/2017 1056   HGBUR NEGATIVE 02/18/2019 [place] 02/18/2019 [place] 02/18/2019 1056   PROTEINUR 30 (A) 02/18/2019 1056   UROBILINOGEN 0.2 07/20/2017 1056   NITRITE NEGATIVE 02/18/2019 [place] 02/18/2019 1056     STUDIES:  CT Abdomen Pelvis W Contrast  Result Date: 09/08/2019 CLINICAL DATA:  Gastrointestinal cancer, staging. Prostate cancer metastatic to stomach and liver. Ongoing chemotherapy. EXAM: CT ABDOMEN AND PELVIS WITH CONTRAST TECHNIQUE: Multidetector CT imaging of the abdomen and pelvis was performed using the standard protocol following bolus administration of intravenous contrast. CONTRAST:  11m OMNIPAQUE IOHEXOL 300 MG/ML  SOLN COMPARISON:  07/16/2019 and 09/07/2018. FINDINGS: Lower chest: Scarring in the lung bases. Nodular density in the subpleural medial right lower lobe measures 4 mm (4/3), unchanged from 09/07/2018. Heart is at the upper limits of normal in size. Small pericardial effusion, stable. No pleural fluid. Hepatobiliary: Heterogeneous nodules and masses in the liver are somewhat difficult to measure. Index mass in the right hepatic lobe measures 3.8 x 4.2 cm (2/9), increased from 3.2 x 4.2 cm on 07/16/2019 and is increasingly necrotic centrally. Index lesion in the left hepatic lobe (segment 4B) has enlarged as well, now measuring 3.0 x 3.2 cm (2/18), compared to 1.5 x 2.1 cm on 07/16/2019. This lesion also appears more centrally necrotic. Gallbladder is unremarkable. No biliary ductal dilatation. Pancreas: Negative. Spleen: Negative. Adrenals/Urinary Tract: Adrenal glands are unremarkable. Subcentimeter low-attenuation lesions in the kidneys are too  small to  characterize but statistically, cysts are likely. Ureters are decompressed. Bladder is grossly unremarkable. Stomach/Bowel: There may be wall thickening at the gastroesophageal junction as well as within the mid and distal stomach. Suspect a discrete mass in the gastric antrum, measuring 1.4 x 2.6 cm (coronal image 13), similar. Small bowel and appendix are otherwise unremarkable. Stool is seen in the majority of the colon, indicative of constipation. Vascular/Lymphatic: Atherosclerotic calcification of the aorta. 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 Adenopathy inferior to the gastric antrum measures approximately 1.5 cm (2/26), similar. Otherwise, no pathologically enlarged lymph nodes. Reproductive: Prostate is enlarged and contains brachytherapy seeds. Other: No free fluid. Mesenteries and peritoneum are otherwise unremarkable. Musculoskeletal: No worrisome lytic or sclerotic lesions. Degenerative disc disease and disc space narrowing at L3-4 and L5-S1. IMPRESSION: 1. Enlarging and increasingly necrotic hepatic metastases. 2. Probable distal gastric mass and adjacent adenopathy, grossly stable. 3. Small pericardial effusion, stable. 4. Enlarged prostate. 5. Aortic atherosclerosis (ICD10-170.0). 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 Electronically Signed   By: Lorin Picket M.D.   On: 09/08/2019 10:01     ELIGIBLE FOR AVAILABLE RESEARCH PROTOCOL: no   ASSESSMENT: 85 y.o. Peter Wood, [place] man status post gastric biopsy 09/20/2018 showing adenocarcinoma, with a CA 19-9 greater than 65,000; staging studies showing an apparent mass adjacent to the head of the pancreas, innumerable liver lesions, upper abdominal mesenteric and peripancreatic lymphadenopathy, and an isolated sclerotic density in the left iliac bone  (a) genomics requested on gastric biopsy showed the tumor to be HER-2 amplified at 3+, PD-L1 positive, and T p53 mutated.  MSI  is stable, mismatch repair status is proficient and the tumor mutational burden is intermediate.  (1) chest CT scan 09/19/2018 shows right lower lobe pulmonary embolism  (a) on intravenous heparin started 09/19/2018, transitioned to apixaban 10/01/2018  (2) genetics testing 10/04/2018: negative  (a) family history of prostate and early breast cancer; patient has a history of prostate cancer  (3) started gemcitabine/Abraxane 10/09/2018, repeated days 1, 8 and 15 of each 28-day cycle  (a) stopped after 1 cycle (3 doses) with reassessment of diagnosis based on CARIS results, noted above  (4) started oxaliplatin, capecitabine and trastuzumab 11/06/2018  (a) baseline echocardiogram 09/20/2018 shows an ejection fraction in the 55-60% range  (b) oxaliplatin and trastuzumab every 21 days started 11/06/2018  (c) capecitabine dose 1000 mg twice daily for 14 days of every 21-day cycle  (d) CT of the abdomen and pelvis 02/11/2019 shows marked improvement in his liver lesions and resolution of periportal adenopathy  (e) tumor markers also show significant response,  (f) echocardiogram 02/12/2019 shows an ejection fraction in the 60-65% range  (g) CT abd/pelvis show stable/ improved disease, tumor markers nearly normal  (h) oxaliplatin stopped aftwer 04/30/2019 dose  (i) capecitabine stopped after September cycle   (5) trastuzumab maintenance started 05/20/2019, last dose 07/22/2019, with progression  (6) disease progression documented by lab work and CT scan 07/16/2019  (a) resumed FOLFOX 07/29/2019, continuing trastuzumab (every 14 days).  (b) restaging studies after 3 cycles showed evidence of progression (tumor markers equivocal)  (c) FOLFOX discontinued 08/26/2019  (7) started T-DM1 09/30/2019, repeat every 21 days  (a) echocardiogram 08/25/2019 showed an ejection fraction in the 60-65% range  (8) to start pembrolizumab 10/21/2019, repeat every 21 days    PLAN: Jarreau tolerated the first  cycle of T-DM1 quite well, with no unusual complications, other than some fatigue, which clinically cleared by  his account.  The plan is to continue this to a minimum of 4 cycles and then restage.  As noted above, we had originally written for Enhertu, but for insurance reasons this was changed.  If we do not document a response with T-DM1 we will switch to Enhertu.  Jhordan's tumor expresses PD-L1.  We discussed that this is a protein that can "blind" the immune system so it does not see the cancer and therefore cannot help the body get rid of the cancer.  Pembrolizumab takes the blinder off so the immune system may be able to see the cancer and help fight it.    We discussed the possible toxicities side effects and complications of pembrolizumab and particularly discussed the issue of pneumonitis.  If Abdullahi develops a persistent dry cough he will call us.  I also urged him to call us with any other changes that may develop and that may be causing him any discomfort.  I will see him again a week after his next treatment.  If all goes well the plan will be to continue these 2 drugs to maximal response and then consider some form of maintenance as before  Total encounter time 35 minutes.Sarajane Jews C. Hayat Warbington, MD 10/07/19 6:01 PM Medical Oncology and Hematology [facility] [place], [place] [address] Tel. [phone]    Fax. [phone]   I, Wilburn Mylar, am acting as scribe for Dr. Virgie Dad. Mariabella Nilsen.  I, Lurline Del MD, have reviewed the above documentation for accuracy and completeness, and I agree with the above.   *Total Encounter Time as defined by the Centers for Medicare and Medicaid Services includes, in addition to the face-to-face time of a patient visit (documented in the note above) non-face-to-face time: obtaining and reviewing outside history, ordering and reviewing medications, tests or procedures, care coordination (communications with  other health care professionals or caregivers) and documentation in the medical record.

## 2019-10-07 ENCOUNTER — Other Ambulatory Visit: Payer: Self-pay

## 2019-10-07 ENCOUNTER — Inpatient Hospital Stay (HOSPITAL_BASED_OUTPATIENT_CLINIC_OR_DEPARTMENT_OTHER): Payer: Medicare PPO | Admitting: Oncology

## 2019-10-07 ENCOUNTER — Inpatient Hospital Stay: Payer: Medicare PPO

## 2019-10-07 VITALS — BP 150/78 | HR 64 | Temp 98.5°F | Resp 17 | Ht 64.5 in | Wt 144.0 lb

## 2019-10-07 DIAGNOSIS — C162 Malignant neoplasm of body of stomach: Secondary | ICD-10-CM

## 2019-10-07 DIAGNOSIS — Z79899 Other long term (current) drug therapy: Secondary | ICD-10-CM | POA: Diagnosis not present

## 2019-10-07 DIAGNOSIS — C169 Malignant neoplasm of stomach, unspecified: Secondary | ICD-10-CM

## 2019-10-07 DIAGNOSIS — Z7189 Other specified counseling: Secondary | ICD-10-CM | POA: Diagnosis not present

## 2019-10-07 DIAGNOSIS — C787 Secondary malignant neoplasm of liver and intrahepatic bile duct: Secondary | ICD-10-CM

## 2019-10-07 DIAGNOSIS — I700 Atherosclerosis of aorta: Secondary | ICD-10-CM | POA: Diagnosis not present

## 2019-10-07 DIAGNOSIS — D649 Anemia, unspecified: Secondary | ICD-10-CM

## 2019-10-07 DIAGNOSIS — C799 Secondary malignant neoplasm of unspecified site: Secondary | ICD-10-CM

## 2019-10-07 DIAGNOSIS — R1909 Other intra-abdominal and pelvic swelling, mass and lump: Secondary | ICD-10-CM | POA: Diagnosis not present

## 2019-10-07 DIAGNOSIS — C61 Malignant neoplasm of prostate: Secondary | ICD-10-CM | POA: Diagnosis not present

## 2019-10-07 DIAGNOSIS — C7889 Secondary malignant neoplasm of other digestive organs: Secondary | ICD-10-CM | POA: Diagnosis not present

## 2019-10-07 DIAGNOSIS — M48061 Spinal stenosis, lumbar region without neurogenic claudication: Secondary | ICD-10-CM | POA: Diagnosis not present

## 2019-10-07 DIAGNOSIS — Z5112 Encounter for antineoplastic immunotherapy: Secondary | ICD-10-CM | POA: Diagnosis not present

## 2019-10-07 LAB — CBC WITH DIFFERENTIAL/PLATELET
Abs Immature Granulocytes: 0.01 10*3/uL (ref 0.00–0.07)
Basophils Absolute: 0 10*3/uL (ref 0.0–0.1)
Basophils Relative: 1 %
Eosinophils Absolute: 0.1 10*3/uL (ref 0.0–0.5)
Eosinophils Relative: 3 %
HCT: 30.5 % — ABNORMAL LOW (ref 39.0–52.0)
Hemoglobin: 9.3 g/dL — ABNORMAL LOW (ref 13.0–17.0)
Immature Granulocytes: 0 %
Lymphocytes Relative: 29 %
Lymphs Abs: 1.3 10*3/uL (ref 0.7–4.0)
MCH: 25.8 pg — ABNORMAL LOW (ref 26.0–34.0)
MCHC: 30.5 g/dL (ref 30.0–36.0)
MCV: 84.5 fL (ref 80.0–100.0)
Monocytes Absolute: 0.7 10*3/uL (ref 0.1–1.0)
Monocytes Relative: 16 %
Neutro Abs: 2.3 10*3/uL (ref 1.7–7.7)
Neutrophils Relative %: 51 %
Platelets: 118 10*3/uL — ABNORMAL LOW (ref 150–400)
RBC: 3.61 MIL/uL — ABNORMAL LOW (ref 4.22–5.81)
RDW: 20.1 % — ABNORMAL HIGH (ref 11.5–15.5)
WBC: 4.5 10*3/uL (ref 4.0–10.5)
nRBC: 0 % (ref 0.0–0.2)

## 2019-10-07 LAB — COMPREHENSIVE METABOLIC PANEL
ALT: 34 U/L (ref 0–44)
AST: 66 U/L — ABNORMAL HIGH (ref 15–41)
Albumin: 3.4 g/dL — ABNORMAL LOW (ref 3.5–5.0)
Alkaline Phosphatase: 190 U/L — ABNORMAL HIGH (ref 38–126)
Anion gap: 9 (ref 5–15)
BUN: 16 mg/dL (ref 8–23)
CO2: 28 mmol/L (ref 22–32)
Calcium: 8.9 mg/dL (ref 8.9–10.3)
Chloride: 102 mmol/L (ref 98–111)
Creatinine, Ser: 1.07 mg/dL (ref 0.61–1.24)
GFR calc Af Amer: >60 mL/min (ref >60)
GFR calc non Af Amer: >60 mL/min (ref >60)
Glucose, Bld: 85 mg/dL (ref 70–99)
Potassium: 3.4 mmol/L — ABNORMAL LOW (ref 3.5–5.1)
Sodium: 139 mmol/L (ref 135–145)
Total Bilirubin: 0.6 mg/dL (ref 0.3–1.2)
Total Protein: 7.1 g/dL (ref 6.5–8.1)

## 2019-10-08 ENCOUNTER — Telehealth: Payer: Self-pay | Admitting: Oncology

## 2019-10-08 NOTE — Telephone Encounter (Signed)
I talk with patient regarding 2/15

## 2019-10-09 DIAGNOSIS — H401111 Primary open-angle glaucoma, right eye, mild stage: Secondary | ICD-10-CM | POA: Diagnosis not present

## 2019-10-09 DIAGNOSIS — H40022 Open angle with borderline findings, high risk, left eye: Secondary | ICD-10-CM | POA: Diagnosis not present

## 2019-10-10 ENCOUNTER — Ambulatory Visit: Payer: Medicare PPO

## 2019-10-18 ENCOUNTER — Ambulatory Visit: Payer: Medicare PPO

## 2019-10-20 ENCOUNTER — Other Ambulatory Visit: Payer: Self-pay | Admitting: Oncology

## 2019-10-21 ENCOUNTER — Other Ambulatory Visit: Payer: Self-pay

## 2019-10-21 ENCOUNTER — Other Ambulatory Visit: Payer: Self-pay | Admitting: Oncology

## 2019-10-21 ENCOUNTER — Inpatient Hospital Stay: Payer: Medicare PPO

## 2019-10-21 ENCOUNTER — Inpatient Hospital Stay: Payer: Medicare PPO | Attending: Oncology

## 2019-10-21 VITALS — BP 141/81 | HR 69 | Temp 98.5°F | Resp 18 | Ht 64.5 in | Wt 150.8 lb

## 2019-10-21 DIAGNOSIS — C163 Malignant neoplasm of pyloric antrum: Secondary | ICD-10-CM | POA: Insufficient documentation

## 2019-10-21 DIAGNOSIS — D649 Anemia, unspecified: Secondary | ICD-10-CM

## 2019-10-21 DIAGNOSIS — C787 Secondary malignant neoplasm of liver and intrahepatic bile duct: Secondary | ICD-10-CM | POA: Diagnosis not present

## 2019-10-21 DIAGNOSIS — C799 Secondary malignant neoplasm of unspecified site: Secondary | ICD-10-CM

## 2019-10-21 DIAGNOSIS — E109 Type 1 diabetes mellitus without complications: Secondary | ICD-10-CM | POA: Diagnosis not present

## 2019-10-21 DIAGNOSIS — Z86711 Personal history of pulmonary embolism: Secondary | ICD-10-CM | POA: Diagnosis not present

## 2019-10-21 DIAGNOSIS — C168 Malignant neoplasm of overlapping sites of stomach: Secondary | ICD-10-CM

## 2019-10-21 DIAGNOSIS — C61 Malignant neoplasm of prostate: Secondary | ICD-10-CM | POA: Insufficient documentation

## 2019-10-21 DIAGNOSIS — C169 Malignant neoplasm of stomach, unspecified: Secondary | ICD-10-CM

## 2019-10-21 DIAGNOSIS — Z5112 Encounter for antineoplastic immunotherapy: Secondary | ICD-10-CM | POA: Diagnosis not present

## 2019-10-21 DIAGNOSIS — Z95828 Presence of other vascular implants and grafts: Secondary | ICD-10-CM

## 2019-10-21 DIAGNOSIS — Z79899 Other long term (current) drug therapy: Secondary | ICD-10-CM | POA: Insufficient documentation

## 2019-10-21 LAB — COMPREHENSIVE METABOLIC PANEL
ALT: 25 U/L (ref 0–44)
AST: 35 U/L (ref 15–41)
Albumin: 3.3 g/dL — ABNORMAL LOW (ref 3.5–5.0)
Alkaline Phosphatase: 121 U/L (ref 38–126)
Anion gap: 6 (ref 5–15)
BUN: 15 mg/dL (ref 8–23)
CO2: 29 mmol/L (ref 22–32)
Calcium: 8.7 mg/dL — ABNORMAL LOW (ref 8.9–10.3)
Chloride: 104 mmol/L (ref 98–111)
Creatinine, Ser: 1.02 mg/dL (ref 0.61–1.24)
GFR calc Af Amer: >60 mL/min (ref >60)
GFR calc non Af Amer: >60 mL/min (ref >60)
Glucose, Bld: 107 mg/dL — ABNORMAL HIGH (ref 70–99)
Potassium: 3.9 mmol/L (ref 3.5–5.1)
Sodium: 139 mmol/L (ref 135–145)
Total Bilirubin: 0.4 mg/dL (ref 0.3–1.2)
Total Protein: 6.5 g/dL (ref 6.5–8.1)

## 2019-10-21 LAB — CBC WITH DIFFERENTIAL/PLATELET
Abs Immature Granulocytes: 0.01 10*3/uL (ref 0.00–0.07)
Basophils Absolute: 0 10*3/uL (ref 0.0–0.1)
Basophils Relative: 1 %
Eosinophils Absolute: 0.3 10*3/uL (ref 0.0–0.5)
Eosinophils Relative: 6 %
HCT: 29.5 % — ABNORMAL LOW (ref 39.0–52.0)
Hemoglobin: 8.9 g/dL — ABNORMAL LOW (ref 13.0–17.0)
Immature Granulocytes: 0 %
Lymphocytes Relative: 29 %
Lymphs Abs: 1.2 10*3/uL (ref 0.7–4.0)
MCH: 24.9 pg — ABNORMAL LOW (ref 26.0–34.0)
MCHC: 30.2 g/dL (ref 30.0–36.0)
MCV: 82.6 fL (ref 80.0–100.0)
Monocytes Absolute: 0.7 10*3/uL (ref 0.1–1.0)
Monocytes Relative: 16 %
Neutro Abs: 2 10*3/uL (ref 1.7–7.7)
Neutrophils Relative %: 48 %
Platelets: 190 10*3/uL (ref 150–400)
RBC: 3.57 MIL/uL — ABNORMAL LOW (ref 4.22–5.81)
RDW: 18.9 % — ABNORMAL HIGH (ref 11.5–15.5)
WBC: 4.2 10*3/uL (ref 4.0–10.5)
nRBC: 0 % (ref 0.0–0.2)

## 2019-10-21 LAB — CEA (IN HOUSE-CHCC): CEA (CHCC-In House): 70.3 ng/mL — ABNORMAL HIGH (ref 0.00–5.00)

## 2019-10-21 LAB — TSH: TSH: 0.76 u[IU]/mL (ref 0.320–4.118)

## 2019-10-21 MED ORDER — SODIUM CHLORIDE 0.9% FLUSH
10.0000 mL | Freq: Once | INTRAVENOUS | Status: AC
  Administered 2019-10-21: 10 mL
  Filled 2019-10-21: qty 10

## 2019-10-21 MED ORDER — ACETAMINOPHEN 325 MG PO TABS
650.0000 mg | ORAL_TABLET | Freq: Once | ORAL | Status: AC
  Administered 2019-10-21: 650 mg via ORAL

## 2019-10-21 MED ORDER — DIPHENHYDRAMINE HCL 25 MG PO CAPS
ORAL_CAPSULE | ORAL | Status: AC
  Filled 2019-10-21: qty 1

## 2019-10-21 MED ORDER — HEPARIN SOD (PORK) LOCK FLUSH 100 UNIT/ML IV SOLN
500.0000 [IU] | Freq: Once | INTRAVENOUS | Status: AC | PRN
  Administered 2019-10-21: 500 [IU]
  Filled 2019-10-21: qty 5

## 2019-10-21 MED ORDER — SODIUM CHLORIDE 0.9 % IV SOLN
Freq: Once | INTRAVENOUS | Status: AC
  Filled 2019-10-21: qty 250

## 2019-10-21 MED ORDER — SODIUM CHLORIDE 0.9% FLUSH
10.0000 mL | INTRAVENOUS | Status: DC | PRN
  Administered 2019-10-21: 10 mL
  Filled 2019-10-21: qty 10

## 2019-10-21 MED ORDER — ACETAMINOPHEN 325 MG PO TABS
ORAL_TABLET | ORAL | Status: AC
  Filled 2019-10-21: qty 2

## 2019-10-21 MED ORDER — SODIUM CHLORIDE 0.9 % IV SOLN
3.6000 mg/kg | Freq: Once | INTRAVENOUS | Status: AC
  Administered 2019-10-21: 240 mg via INTRAVENOUS
  Filled 2019-10-21: qty 8

## 2019-10-21 MED ORDER — DIPHENHYDRAMINE HCL 25 MG PO CAPS
25.0000 mg | ORAL_CAPSULE | Freq: Once | ORAL | Status: AC
  Administered 2019-10-21: 25 mg via ORAL

## 2019-10-21 NOTE — Progress Notes (Signed)
Spoke w/ Dr. Jana Hakim today in infusion. Patient will not receive pembrolizumab today in infusion as drug assistance has not been obtained yet. Kadcyla only. He would like patient to receive pembrolizumab w/ C3 if patient assistance goes through at that time. Rob notified.   Demetrius Charity, PharmD, [facility] Pharmacist Pharmacy Phone: [phone] 10/21/2019

## 2019-10-21 NOTE — Patient Instructions (Signed)
[facility] Discharge Instructions for Patients Receiving Chemotherapy  Today you received the following chemotherapy agents: Ado-Trastuzumab Emtansine (Kadcyla)  To help prevent nausea and vomiting after your treatment, we encourage you to take your nausea medication as directed.     If you develop nausea and vomiting that is not controlled by your nausea medication, call the clinic.   BELOW ARE SYMPTOMS THAT SHOULD BE REPORTED IMMEDIATELY:  *FEVER GREATER THAN 100.5 F  *CHILLS WITH OR WITHOUT FEVER  NAUSEA AND VOMITING THAT IS NOT CONTROLLED WITH YOUR NAUSEA MEDICATION  *UNUSUAL SHORTNESS OF BREATH  *UNUSUAL BRUISING OR BLEEDING  TENDERNESS IN MOUTH AND THROAT WITH OR WITHOUT PRESENCE OF ULCERS  *URINARY PROBLEMS  *BOWEL PROBLEMS  UNUSUAL RASH Items with * indicate a potential emergency and should be followed up as soon as possible.  Feel free to call the clinic should you have any questions or concerns. The clinic phone number is [phone].  Please show the CHEMO ALERT CARD at check-in to the Emergency Department and triage nurse.  Coronavirus (COVID-19) Are you at risk?  Are you at risk for the Coronavirus (COVID-19)?  To be considered HIGH RISK for Coronavirus (COVID-19), you have to meet the following criteria:  . Traveled to [place], [place], [place], [place] or [place]; or in the [place] to [place], [place], [place], or [place]; and have fever, cough, and shortness of breath within the last 2 weeks of travel OR . Been in close contact with a person diagnosed with COVID-19 within the last 2 weeks and have fever, cough, and shortness of breath . IF YOU DO NOT MEET THESE CRITERIA, YOU ARE CONSIDERED LOW RISK FOR COVID-19.  What to do if you are HIGH RISK for COVID-19?  . If you are having a medical emergency, call 911. . Seek medical care right away. Before you go to a doctor's office, urgent care or emergency department, call ahead  and tell them about your recent travel, contact with someone diagnosed with COVID-19, and your symptoms. You should receive instructions from your physician's office regarding next steps of care.  . When you arrive at healthcare provider, tell the healthcare staff immediately you have returned from visiting [place], [place], [place], [place] or [place]; or traveled in the [place] to [place], [place], [place], or [place]; in the last two weeks or you have been in close contact with a person diagnosed with COVID-19 in the last 2 weeks.   . Tell the health care staff about your symptoms: fever, cough and shortness of breath. . After you have been seen by a medical provider, you will be either: o Tested for (COVID-19) and discharged home on quarantine except to seek medical care if symptoms worsen, and asked to  - Stay home and avoid contact with others until you get your results (4-5 days)  - Avoid travel on public transportation if possible (such as bus, train, or airplane) or o Sent to the Emergency Department by EMS for evaluation, COVID-19 testing, and possible admission depending on your condition and test results.  What to do if you are LOW RISK for COVID-19?  Reduce your risk of any infection by using the same precautions used for avoiding the common cold or flu:  . Wash your hands often with soap and warm water for at least 20 seconds.  If soap and water are not readily available, use an alcohol-based hand sanitizer with at least 60% alcohol.  .   If coughing or sneezing, cover your mouth and nose by coughing or sneezing into the elbow areas of your shirt or coat, into a tissue or into your sleeve (not your hands). . Avoid shaking hands with others and consider head nods or verbal greetings only. . Avoid touching your eyes, nose, or mouth with unwashed hands.  . Avoid close contact with people who are sick. . Avoid places or events with large numbers of people in one location, like  concerts or sporting events. . Carefully consider travel plans you have or are making. . If you are planning any travel outside or inside the [place], visit the CDC's Travelers' Health webpage for the latest health notices. . If you have some symptoms but not all symptoms, continue to monitor at home and seek medical attention if your symptoms worsen. . If you are having a medical emergency, call 911.   ADDITIONAL HEALTHCARE OPTIONS FOR PATIENTS  [facility] / e-Visit: [email or web address]         [facility]: [phone]  [facility]: [phone]                   [facility]: [phone]   

## 2019-10-22 LAB — CANCER ANTIGEN 19-9: CA 19-9: 48 U/mL — ABNORMAL HIGH (ref 0–35)

## 2019-10-26 NOTE — Progress Notes (Signed)
[facility]  [phone] [phone]     ID: Peter Wood DOB: 04-May-1935  MR#: [id number]  JYN#:[id number]  Patient Care Team: Peter Anger, MD as PCP - General Peter Banister, MD as Attending Physician (Gastroenterology) Peter Wood, Peter Dad, MD as Consulting Physician (Oncology) Peter Mayer, MD as Consulting Physician (Gastroenterology) Peter Pedro, MD as Consulting Physician (Ophthalmology) Peter Dredge, MD as Referring Physician (Radiation Oncology) Peter Dresser, MD as Consulting Physician (Cardiology) OTHER MD:   CHIEF COMPLAINT: Metastatic gastric adenocarcinoma  CURRENT TREATMENT: pembrolizumab, T-DM1   INTERVAL HISTORY: Peter Wood returns today for follow-up and treatment of his metastatic gastric adenocarcinoma accompanied by his wife Peter Wood.   His most recent echocardiogram on 08/25/2019 showed an ejection fraction of 60-65%.    He was started on Kadcyla on 09/29/2018.  He feels a little sleepy and tired the evening of treatment, but by the next day he is back in shape and doing his usual activities.  He has had no nausea, altered taste, or loss of appetite related to this.  He has normal bowel movements.  He was scheduled to have pembrolizumab with his next dose of Kadcyla, 10/21/2019, but drug assistance for pembrolizumab had not yet been obtained.  I believe that her ready has been clear so with his third cycle of Kadcyla he will start his [facility]  Lab Results  Component Value Date   QMV784 48 (H) 10/21/2019   ONG295 40 (H) 09/30/2019   CAN199 50 (H) 09/09/2019   CAN199 73 (H) 08/12/2019   CAN199 58 (H) 07/22/2019    REVIEW OF SYSTEMS: Peter Wood continues to do remarkably well.  There has been no bleeding or bruising on his anticoagulants.  He has had no unusual headaches visual changes cough phlegm production or pleurisy.  A detailed review of systems today was otherwise stable.   HISTORY OF  CURRENT ILLNESS: From the original intake note:  Peter Wood presented to the emergency room 09/07/2018 with mid/upper abdominal pain.  He reported weight loss of 25 pounds over the past 6 months and significant anorexia.  His liver function tests were abnormal and a right upper quadrant ultrasound was obtained.  This was read as concerning for malignancy and a CT of the abdomen and pelvis with contrast was obtained the same day, showing  1. Too numerous to count hypodense masses of the liver concerning for hepatic metastasis. In the region of the porta hepatis, there are hypodense lesions more likely associated with the liver though the pancreatic head is partially obscured by a 3.2 cm hypodense mass. Findings are more likely associated with the liver and less likely an isolated pancreatic mass. 2. Upper abdominal mesenteric and peripancreatic lymphadenopathy suspicious for metastatic disease. 3. 6 mm sclerotic density in the left iliac bone adjacent to the SI joint. Given history of prostate cancer, differential considerations would include osteoblastic metastasis or possibly bone island. Showed only diverticular disease.  He was referred to GI and Dr. Ardis Wood notes a colonoscopy performed 04/23/2017 makes the possibility of colon cancer unlikely.  Further work-up was planned, but on 09/19/2018 the patient again presented to the emergency room with chest pain, now in a different area, and a staging CT scan of the chest showed filling defects in the right lower lobe pulmonary arteries consistent with embolism.  There was a small pericardial effusion and an additional 2.4 cm left thyroid mass.  Aortic atherosclerosis was noted  He was started on intravenous heparin  and on 09/20/2018 underwent upper endoscopy under Dr. Carlean Wood showing a giant nonbleeding cratered gastric ulcer in the posterior wall of the stomach.  Biopsies of this procedure showed (Peter Wood) adenocarcinoma.   At that point we were  consulted and we obtained tumor markers which included a CEA of 1318.2, a CA 19-9 of 67,479, a normal AFP and a normal PSA.  I discussed the case with pathology and they do not feel that additional testing would be helpful in terms of discriminating between a primary gastric and a primary biliary/pancreatic tumor.  The patient's subsequent history is as detailed below.   PAST MEDICAL HISTORY: Past Medical History:  Diagnosis Date  . Arthritis   . Diverticulosis of colon   . Family history of breast cancer   . Family history of prostate cancer   . HTN (hypertension)   . Hx of colonic polyp   . Pneumonia    as a child/baby  . Poor circulation of extremity    right leg  . Prostate cancer ([place]) 2015   prostate cancer - radiation treated    PAST SURGICAL HISTORY: Past Surgical History:  Procedure Laterality Date  . [place] VITRECTOMY WITH 20 GAUGE MVR PORT FOR MACULAR HOLE Right 09/19/2016   Procedure: 25 GAUGE PARS PLANA VITRECTOMY WITH 20 GAUGE MVR PORT FOR MACULAR HOLE, MEMBRANE PEEL, SERUM PATCH, GAS FLUID EXCHANGE, HEADSCOPE LASER;  Surgeon: Peter Pedro, MD;  Location: [facility];  Service: Ophthalmology;  Laterality: Right;  . BIOPSY  09/20/2018   Procedure: BIOPSY;  Surgeon: Peter Mayer, MD;  Location[facility] END[facility]COPY;  Service: Endoscopy;;  . CATARACT EXTRACTION  08/27/12   Right  . COLONOSCOPY    . ESOPHAGOGASTRODUODENOSCOPY (EGD) WITH PROPOFOL N/A 09/20/2018   Procedure: ESOPHAGOGASTRODUODENOSCOPY (EGD) WITH PROPOFOL;  Surgeon: Peter Mayer, MD;  Location: [facility];  Service: Endoscopy;  Laterality: N/A;  . HAND SURGERY  2016   right thumb  . IR IMAGING GUIDED PORT INSERTION  11/04/2018  . KNEE SURGERY Left 1989    FAMILY HISTORY: Family History  Problem Relation Age of Onset  . Prostate cancer Father        dx >50  . Hypertension Mother   . Ulcers Mother   . Breast cancer Sister        dx 20's  . Prostate cancer Brother        dx under 2,  metasttic, cause of his death  . Ulcers Brother   . Ulcers Sister    Ural's father died from heart complications with a pacemaker at age 54; Amoni's father also has prostate cancer. Patients' mother died from natural causes at age 42. The patient has 4 brothers and 3 sisters. One of Chamita sisters had breast cancer in her 46s and a brother had prostate cancer. Patient denies anyone in her family having ovarian, or pancreatic cancer. Dionta mentions that his youngest sister and his brother with prostate cancer also had stomach ulcers.    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 His wife, Peter Wood, is a retired [employer] summer Radiographer, therapeutic. As of 09/2018, they have been married for 56 years. They have 4 children, Claiborne Billings, Van Meter, Stanley, and Wallula. Claiborne Billings lives in [place] and works at the [employer]. Sherrod lives in [place] and works at [employer] and as a part Horticulturist, commercial. Germane lives in [place] and is a Engineer, drilling for a medical company. Beverely Low lives in [place] and works at [employer].  Derris has 7 grandchildren and 1 step great grandchild. He attends [employer].    ADVANCED DIRECTIVES: His wife, Peter Wood, is automatically his healthcare power of attorney.     HEALTH MAINTENANCE: Social History   Tobacco Use  . Smoking status: Former Smoker    Years: 4.00    Quit date: 09/12/1967    Years since quitting: 52.1  . Smokeless tobacco: Never Used  Substance Use Topics  . Alcohol use: No  . Drug use: No    Colonoscopy: 2018/[facility]  PSA: 0.54 on 09/18/2018  No Known Allergies  Current Outpatient Medications  Medication Sig Dispense Refill  . amLODipine (NORVASC) 5 MG tablet TAKE ONE TABLET BY MOUTH ONE TIME DAILY  90 tablet 2  . apixaban (ELIQUIS) 2.5 MG TABS tablet Take 1 tablet (2.5 mg total) by mouth 2 (two) times daily. 60 tablet 11  . benazepril (LOTENSIN) 40 MG tablet TAKE ONE TABLET BY MOUTH ONE TIME DAILY  90 tablet 2  .  Cholecalciferol 1000 UNITS tablet Take 1,000 Units by mouth daily.     Kendall Flack 575 MG/5ML SYRP Take by mouth.    . latanoprost (XALATAN) 0.005 % ophthalmic solution Place 1 drop into both eyes at bedtime.     . lidocaine-prilocaine (EMLA) cream Apply to affected area once 30 g 3  . ondansetron (ZOFRAN) 4 MG tablet Take 1 tablet (4 mg total) by mouth every 8 (eight) hours as needed for nausea or vomiting. 20 tablet 1   No current facility-administered medications for this visit.   Facility-Administered Medications Ordered in Other Visits  Medication Dose Route Frequency Provider Last Rate Last Admin  . sodium chloride flush (NS) 0.9 % injection 10 mL  10 mL Intravenous PRN Llewellyn Schoenberger, Peter Dad, MD        OBJECTIVE: Older African-American in no acute distress  Vitals:   10/27/19 1046  BP: (!) 152/82  Pulse: 75  Resp: 17  Temp: 98.5 F (36.9 C)  SpO2: 100%   Wt Readings from Last 3 Encounters:  10/27/19 148 lb 4.8 oz (67.3 kg)  10/21/19 150 lb 12.8 oz (68.4 kg)  10/07/19 144 lb (65.3 kg)   Body mass index is 25.06 kg/m.    ECOG FS:1 - Symptomatic but completely ambulatory  Sclerae unicteric, EOMs intact Wearing a mask No cervical or supraclavicular adenopathy Lungs no rales or rhonchi Heart regular rate and rhythm Abd soft, nontender, positive bowel sounds, no masses palpated MSK no focal spinal tenderness, no upper extremity lymphedema Neuro: nonfocal, well oriented, appropriate affect   LAB RESULTS:  CMP     Component Value Date/Time   NA 140 10/27/2019 1035   K 3.5 10/27/2019 1035   CL 103 10/27/2019 1035   CO2 29 10/27/2019 1035   GLUCOSE 108 (H) 10/27/2019 1035   GLUCOSE 105 (H) 09/13/2006 0900   BUN 13 10/27/2019 1035   CREATININE 0.96 10/27/2019 1035   CREATININE 1.02 10/16/2018 0936   CALCIUM 9.0 10/27/2019 1035   PROT 7.0 10/27/2019 1035   ALBUMIN 3.4 (L) 10/27/2019 1035   AST 49 (H) 10/27/2019 1035   AST 76 (H) 10/16/2018 0936   ALT 23  10/27/2019 1035   ALT 47 (H) 10/16/2018 0936   ALKPHOS 132 (H) 10/27/2019 1035   BILITOT 0.5 10/27/2019 1035   BILITOT 0.7 10/16/2018 0936   GFRNONAA >60 10/27/2019 1035   GFRNONAA >60 10/16/2018 0936   GFRAA >60 10/27/2019 1035   GFRAA >60 10/16/2018 7371  No results found for: TOTALPROTELP, ALBUMINELP, A1GS, A2GS, BETS, BETA2SER, GAMS, MSPIKE, SPEI  No results found for: KPAFRELGTCHN, LAMBDASER, KAPLAMBRATIO  Lab Results  Component Value Date   WBC 4.5 10/27/2019   NEUTROABS 2.2 10/27/2019   HGB 9.2 (L) 10/27/2019   HCT 30.3 (L) 10/27/2019   MCV 82.6 10/27/2019   PLT 139 (L) 10/27/2019    No results found for: LABCA2  No components found for: KGMWNU272  No results for input(s): INR in the last 168 hours.  No results found for: LABCA2  Lab Results  Component Value Date   ZDG644 03 (H) 10/21/2019    No results found for: KVQ259  No results found for: DGL875  No results found for: CA2729  No components found for: HGQUANT  Lab Results  Component Value Date   CEA1 70.30 (H) 10/21/2019   /  CEA ([facility])  Date Value Ref Range Status  10/21/2019 70.30 (H) 0.00 - 5.00 ng/mL Final    Comment:    (NOTE) This test was performed using Architect's Chemiluminescent Microparticle Immunoassay. Values obtained from different assay methods cannot be used interchangeably. Please note that 5-10% of patients who smoke may see CEA levels up to 6.9 ng/mL. Performed at [facility], [place] [address], [place], [place] [address]      No results found for: AFPTUMOR  No results found for: CHROMOGRNA  No results found for: HGBA, HGBA2QUANT, HGBFQUANT, HGBSQUAN (Hemoglobinopathy evaluation)   No results found for: LDH  Lab Results  Component Value Date   IRON 38 (L) 09/21/2018   TIBC 251 09/21/2018   IRONPCTSAT 15 (L) 09/21/2018   (Iron and TIBC)  Lab Results  Component Value Date   FERRITIN 138 09/21/2018    Urinalysis      Component Value Date/Time   COLORURINE YELLOW 02/18/2019 1056   APPEARANCEUR CLEAR 02/18/2019 1056   LABSPEC 1.018 02/18/2019 1056   PHURINE 6.0 02/18/2019 1056   GLUCOSEU NEGATIVE 02/18/2019 [place] 07/20/2017 1056   HGBUR NEGATIVE 02/18/2019 [place] 02/18/2019 [place] 02/18/2019 1056   PROTEINUR 30 (A) 02/18/2019 1056   UROBILINOGEN 0.2 07/20/2017 1056   NITRITE NEGATIVE 02/18/2019 [place] 02/18/2019 1056    STUDIES:  No results found.   ELIGIBLE FOR AVAILABLE RESEARCH PROTOCOL: no   ASSESSMENT: 85 y.o. [place], [place] man status post gastric biopsy 09/20/2018 showing adenocarcinoma, with a CA 19-9 greater than 65,000; staging studies showing an apparent mass adjacent to the head of the pancreas, innumerable liver lesions, upper abdominal mesenteric and peripancreatic lymphadenopathy, and an isolated sclerotic density in the left iliac bone  (a) genomics requested on gastric biopsy showed the tumor to be HER-2 amplified at 3+, PD-L1 positive, and T p53 mutated.  MSI is stable, mismatch repair status is proficient and the tumor mutational burden is intermediate.  (1) chest CT scan 09/19/2018 shows right lower lobe pulmonary embolism  (a) on intravenous heparin started 09/19/2018, transitioned to apixaban 10/01/2018  (2) genetics testing 10/04/2018: negative  (a) family history of prostate and early breast cancer; patient has a history of prostate cancer  (3) started gemcitabine/Abraxane 10/09/2018, repeated days 1, 8 and 15 of each 28-day cycle  (a) stopped after 1 cycle (3 doses) with reassessment of diagnosis based on CARIS results, noted above  (4) started oxaliplatin, capecitabine and trastuzumab 11/06/2018  (a) baseline echocardiogram 09/20/2018 shows an ejection fraction in the 55-60% range  (b) oxaliplatin  and trastuzumab every 21 days started 11/06/2018  (c) capecitabine dose 1000 mg twice  daily for 14 days of every 21-day cycle  (d) CT of the abdomen and pelvis 02/11/2019 shows marked improvement in his liver lesions and resolution of periportal adenopathy  (e) tumor markers also show significant response,  (f) echocardiogram 02/12/2019 shows an ejection fraction in the 60-65% range  (g) CT abd/pelvis show stable/ improved disease, tumor markers nearly normal  (h) oxaliplatin stopped aftwer 04/30/2019 dose  (i) capecitabine stopped after September cycle   (5) trastuzumab maintenance started 05/20/2019, last dose 07/22/2019, with progression  (6) disease progression documented by lab work and CT scan 07/16/2019  (a) resumed FOLFOX 07/29/2019, continuing trastuzumab (every 14 days).  (b) restaging studies after 3 cycles showed evidence of progression (tumor markers equivocal)  (c) FOLFOX discontinued 08/26/2019  (7) started T-DM1 09/30/2019, repeat every 21 days  (a) echocardiogram 08/25/2019 showed an ejection fraction in the 60-65% range  (8) to start pembrolizumab 03/02//2021, repeat every 21 days    PLAN: Peter Wood continues to tolerate T-DM1 without any unusual side effects.  He will have his third dose on 11/11/2019 and hopefully without when we will be able to add the pembrolizumab.  We again reviewed the possible toxicities side effects and complications of pembrolizumab and he understands aside from issues regarding rash and diarrhea, we are concerned about any new symptoms that he may develop including dry cough, or palpitations for example.  We will also follow his GI symptoms but right now he has essentially none.  I am going to see him again in April, after he has been restaged.  If we document disease progression we will switch him to [place] at that time.  He knows to call for any other issue that may develop before then.  Total encounter time 30 minutes.Sarajane Jews C. Xian Alves, MD 10/27/19 12:42 PM Medical Oncology and Hematology [facility] [place], [place] [address] Tel. [phone]    Fax. [phone]   I, Wilburn Mylar, am acting as scribe for Dr. Virgie Wood. Shahrzad Koble.  I, Lurline Del MD, have reviewed the above documentation for accuracy and completeness, and I agree with the above.   *Total Encounter Time as defined by the Centers for Medicare and Medicaid Services includes, in addition to the face-to-face time of a patient visit (documented in the note above) non-face-to-face time: obtaining and reviewing outside history, ordering and reviewing medications, tests or procedures, care coordination (communications with other health care professionals or caregivers) and documentation in the medical record.

## 2019-10-27 ENCOUNTER — Ambulatory Visit: Payer: Medicare PPO

## 2019-10-27 ENCOUNTER — Inpatient Hospital Stay (HOSPITAL_BASED_OUTPATIENT_CLINIC_OR_DEPARTMENT_OTHER): Payer: Medicare PPO | Admitting: Oncology

## 2019-10-27 ENCOUNTER — Inpatient Hospital Stay: Payer: Medicare PPO

## 2019-10-27 ENCOUNTER — Other Ambulatory Visit: Payer: Self-pay

## 2019-10-27 ENCOUNTER — Telehealth: Payer: Self-pay | Admitting: Oncology

## 2019-10-27 VITALS — BP 152/82 | HR 75 | Temp 98.5°F | Resp 17 | Ht 64.5 in | Wt 148.3 lb

## 2019-10-27 DIAGNOSIS — D649 Anemia, unspecified: Secondary | ICD-10-CM

## 2019-10-27 DIAGNOSIS — E109 Type 1 diabetes mellitus without complications: Secondary | ICD-10-CM | POA: Diagnosis not present

## 2019-10-27 DIAGNOSIS — C161 Malignant neoplasm of fundus of stomach: Secondary | ICD-10-CM | POA: Diagnosis not present

## 2019-10-27 DIAGNOSIS — Z5112 Encounter for antineoplastic immunotherapy: Secondary | ICD-10-CM | POA: Diagnosis not present

## 2019-10-27 DIAGNOSIS — C169 Malignant neoplasm of stomach, unspecified: Secondary | ICD-10-CM

## 2019-10-27 DIAGNOSIS — Z7189 Other specified counseling: Secondary | ICD-10-CM | POA: Diagnosis not present

## 2019-10-27 DIAGNOSIS — C163 Malignant neoplasm of pyloric antrum: Secondary | ICD-10-CM | POA: Diagnosis not present

## 2019-10-27 DIAGNOSIS — Z95828 Presence of other vascular implants and grafts: Secondary | ICD-10-CM

## 2019-10-27 DIAGNOSIS — Z86711 Personal history of pulmonary embolism: Secondary | ICD-10-CM | POA: Diagnosis not present

## 2019-10-27 DIAGNOSIS — C787 Secondary malignant neoplasm of liver and intrahepatic bile duct: Secondary | ICD-10-CM

## 2019-10-27 DIAGNOSIS — C799 Secondary malignant neoplasm of unspecified site: Secondary | ICD-10-CM | POA: Diagnosis not present

## 2019-10-27 DIAGNOSIS — C61 Malignant neoplasm of prostate: Secondary | ICD-10-CM | POA: Diagnosis not present

## 2019-10-27 DIAGNOSIS — Z79899 Other long term (current) drug therapy: Secondary | ICD-10-CM | POA: Diagnosis not present

## 2019-10-27 LAB — COMPREHENSIVE METABOLIC PANEL
ALT: 23 U/L (ref 0–44)
AST: 49 U/L — ABNORMAL HIGH (ref 15–41)
Albumin: 3.4 g/dL — ABNORMAL LOW (ref 3.5–5.0)
Alkaline Phosphatase: 132 U/L — ABNORMAL HIGH (ref 38–126)
Anion gap: 8 (ref 5–15)
BUN: 13 mg/dL (ref 8–23)
CO2: 29 mmol/L (ref 22–32)
Calcium: 9 mg/dL (ref 8.9–10.3)
Chloride: 103 mmol/L (ref 98–111)
Creatinine, Ser: 0.96 mg/dL (ref 0.61–1.24)
GFR calc Af Amer: >60 mL/min (ref >60)
GFR calc non Af Amer: >60 mL/min (ref >60)
Glucose, Bld: 108 mg/dL — ABNORMAL HIGH (ref 70–99)
Potassium: 3.5 mmol/L (ref 3.5–5.1)
Sodium: 140 mmol/L (ref 135–145)
Total Bilirubin: 0.5 mg/dL (ref 0.3–1.2)
Total Protein: 7 g/dL (ref 6.5–8.1)

## 2019-10-27 LAB — CBC WITH DIFFERENTIAL/PLATELET
Abs Immature Granulocytes: 0.03 10*3/uL (ref 0.00–0.07)
Basophils Absolute: 0.1 10*3/uL (ref 0.0–0.1)
Basophils Relative: 1 %
Eosinophils Absolute: 0.3 10*3/uL (ref 0.0–0.5)
Eosinophils Relative: 6 %
HCT: 30.3 % — ABNORMAL LOW (ref 39.0–52.0)
Hemoglobin: 9.2 g/dL — ABNORMAL LOW (ref 13.0–17.0)
Immature Granulocytes: 1 %
Lymphocytes Relative: 30 %
Lymphs Abs: 1.4 10*3/uL (ref 0.7–4.0)
MCH: 25.1 pg — ABNORMAL LOW (ref 26.0–34.0)
MCHC: 30.4 g/dL (ref 30.0–36.0)
MCV: 82.6 fL (ref 80.0–100.0)
Monocytes Absolute: 0.7 10*3/uL (ref 0.1–1.0)
Monocytes Relative: 15 %
Neutro Abs: 2.2 10*3/uL (ref 1.7–7.7)
Neutrophils Relative %: 47 %
Platelets: 139 10*3/uL — ABNORMAL LOW (ref 150–400)
RBC: 3.67 MIL/uL — ABNORMAL LOW (ref 4.22–5.81)
RDW: 18.3 % — ABNORMAL HIGH (ref 11.5–15.5)
WBC: 4.5 10*3/uL (ref 4.0–10.5)
nRBC: 0 % (ref 0.0–0.2)

## 2019-10-27 MED ORDER — SODIUM CHLORIDE 0.9% FLUSH
10.0000 mL | Freq: Once | INTRAVENOUS | Status: AC
  Administered 2019-10-27: 10 mL
  Filled 2019-10-27: qty 10

## 2019-10-27 MED ORDER — HEPARIN SOD (PORK) LOCK FLUSH 100 UNIT/ML IV SOLN
500.0000 [IU] | Freq: Once | INTRAVENOUS | Status: AC
  Administered 2019-10-27: 11:00:00 500 [IU]
  Filled 2019-10-27: qty 5

## 2019-10-27 NOTE — Telephone Encounter (Signed)
I talk with patient regarding 4/6

## 2019-11-11 ENCOUNTER — Inpatient Hospital Stay: Payer: Medicare PPO

## 2019-11-11 ENCOUNTER — Other Ambulatory Visit: Payer: Self-pay

## 2019-11-11 ENCOUNTER — Inpatient Hospital Stay: Payer: Medicare PPO | Attending: Oncology

## 2019-11-11 ENCOUNTER — Other Ambulatory Visit: Payer: Self-pay | Admitting: Oncology

## 2019-11-11 VITALS — BP 142/82 | HR 58 | Temp 98.2°F | Resp 17

## 2019-11-11 DIAGNOSIS — Z5112 Encounter for antineoplastic immunotherapy: Secondary | ICD-10-CM | POA: Diagnosis not present

## 2019-11-11 DIAGNOSIS — C169 Malignant neoplasm of stomach, unspecified: Secondary | ICD-10-CM

## 2019-11-11 DIAGNOSIS — C168 Malignant neoplasm of overlapping sites of stomach: Secondary | ICD-10-CM

## 2019-11-11 DIAGNOSIS — Z79899 Other long term (current) drug therapy: Secondary | ICD-10-CM | POA: Diagnosis not present

## 2019-11-11 DIAGNOSIS — C162 Malignant neoplasm of body of stomach: Secondary | ICD-10-CM | POA: Diagnosis not present

## 2019-11-11 DIAGNOSIS — C799 Secondary malignant neoplasm of unspecified site: Secondary | ICD-10-CM

## 2019-11-11 DIAGNOSIS — C787 Secondary malignant neoplasm of liver and intrahepatic bile duct: Secondary | ICD-10-CM | POA: Insufficient documentation

## 2019-11-11 DIAGNOSIS — D649 Anemia, unspecified: Secondary | ICD-10-CM

## 2019-11-11 DIAGNOSIS — C61 Malignant neoplasm of prostate: Secondary | ICD-10-CM

## 2019-11-11 LAB — COMPREHENSIVE METABOLIC PANEL
ALT: 19 U/L (ref 0–44)
AST: 33 U/L (ref 15–41)
Albumin: 3.3 g/dL — ABNORMAL LOW (ref 3.5–5.0)
Alkaline Phosphatase: 108 U/L (ref 38–126)
Anion gap: 8 (ref 5–15)
BUN: 17 mg/dL (ref 8–23)
CO2: 28 mmol/L (ref 22–32)
Calcium: 8.7 mg/dL — ABNORMAL LOW (ref 8.9–10.3)
Chloride: 105 mmol/L (ref 98–111)
Creatinine, Ser: 1.04 mg/dL (ref 0.61–1.24)
GFR calc Af Amer: >60 mL/min (ref >60)
GFR calc non Af Amer: >60 mL/min (ref >60)
Glucose, Bld: 106 mg/dL — ABNORMAL HIGH (ref 70–99)
Potassium: 3.9 mmol/L (ref 3.5–5.1)
Sodium: 141 mmol/L (ref 135–145)
Total Bilirubin: 0.6 mg/dL (ref 0.3–1.2)
Total Protein: 6.8 g/dL (ref 6.5–8.1)

## 2019-11-11 LAB — CBC WITH DIFFERENTIAL/PLATELET
Abs Immature Granulocytes: 0.01 10*3/uL (ref 0.00–0.07)
Basophils Absolute: 0.1 10*3/uL (ref 0.0–0.1)
Basophils Relative: 1 %
Eosinophils Absolute: 0.4 10*3/uL (ref 0.0–0.5)
Eosinophils Relative: 10 %
HCT: 29.9 % — ABNORMAL LOW (ref 39.0–52.0)
Hemoglobin: 9 g/dL — ABNORMAL LOW (ref 13.0–17.0)
Immature Granulocytes: 0 %
Lymphocytes Relative: 33 %
Lymphs Abs: 1.3 10*3/uL (ref 0.7–4.0)
MCH: 24.5 pg — ABNORMAL LOW (ref 26.0–34.0)
MCHC: 30.1 g/dL (ref 30.0–36.0)
MCV: 81.3 fL (ref 80.0–100.0)
Monocytes Absolute: 0.5 10*3/uL (ref 0.1–1.0)
Monocytes Relative: 14 %
Neutro Abs: 1.6 10*3/uL — ABNORMAL LOW (ref 1.7–7.7)
Neutrophils Relative %: 42 %
Platelets: 219 10*3/uL (ref 150–400)
RBC: 3.68 MIL/uL — ABNORMAL LOW (ref 4.22–5.81)
RDW: 18.1 % — ABNORMAL HIGH (ref 11.5–15.5)
WBC: 3.8 10*3/uL — ABNORMAL LOW (ref 4.0–10.5)
nRBC: 0 % (ref 0.0–0.2)

## 2019-11-11 LAB — TSH: TSH: 1.544 u[IU]/mL (ref 0.320–4.118)

## 2019-11-11 LAB — CEA (IN HOUSE-CHCC): CEA (CHCC-In House): 49.78 ng/mL — ABNORMAL HIGH (ref 0.00–5.00)

## 2019-11-11 MED ORDER — DIPHENHYDRAMINE HCL 25 MG PO CAPS
ORAL_CAPSULE | ORAL | Status: AC
  Filled 2019-11-11: qty 1

## 2019-11-11 MED ORDER — SODIUM CHLORIDE 0.9% FLUSH
10.0000 mL | INTRAVENOUS | Status: DC | PRN
  Administered 2019-11-11: 10 mL
  Filled 2019-11-11: qty 10

## 2019-11-11 MED ORDER — ACETAMINOPHEN 325 MG PO TABS
650.0000 mg | ORAL_TABLET | Freq: Once | ORAL | Status: AC
  Administered 2019-11-11: 650 mg via ORAL

## 2019-11-11 MED ORDER — DIPHENHYDRAMINE HCL 25 MG PO CAPS
25.0000 mg | ORAL_CAPSULE | Freq: Once | ORAL | Status: AC
  Administered 2019-11-11: 25 mg via ORAL

## 2019-11-11 MED ORDER — ACETAMINOPHEN 325 MG PO TABS
ORAL_TABLET | ORAL | Status: AC
  Filled 2019-11-11: qty 2

## 2019-11-11 MED ORDER — SODIUM CHLORIDE 0.9 % IV SOLN
3.6000 mg/kg | Freq: Once | INTRAVENOUS | Status: AC
  Administered 2019-11-11: 240 mg via INTRAVENOUS
  Filled 2019-11-11: qty 8

## 2019-11-11 MED ORDER — HEPARIN SOD (PORK) LOCK FLUSH 100 UNIT/ML IV SOLN
500.0000 [IU] | Freq: Once | INTRAVENOUS | Status: AC | PRN
  Administered 2019-11-11: 500 [IU]
  Filled 2019-11-11: qty 5

## 2019-11-11 MED ORDER — SODIUM CHLORIDE 0.9 % IV SOLN
Freq: Once | INTRAVENOUS | Status: AC
  Filled 2019-11-11: qty 250

## 2019-11-11 MED ORDER — SODIUM CHLORIDE 0.9 % IV SOLN: 200.0000 mg | Freq: Once | INTRAVENOUS | Status: DC

## 2019-11-11 NOTE — Patient Instructions (Signed)

## 2019-11-11 NOTE — Patient Instructions (Signed)
[facility] Discharge Instructions for Patients Receiving Chemotherapy  Today you received the following chemotherapy agents Kadcyla  To help prevent nausea and vomiting after your treatment, we encourage you to take your nausea medication as directed   If you develop nausea and vomiting that is not controlled by your nausea medication, call the clinic.   BELOW ARE SYMPTOMS THAT SHOULD BE REPORTED IMMEDIATELY:  *FEVER GREATER THAN 100.5 F  *CHILLS WITH OR WITHOUT FEVER  NAUSEA AND VOMITING THAT IS NOT CONTROLLED WITH YOUR NAUSEA MEDICATION  *UNUSUAL SHORTNESS OF BREATH  *UNUSUAL BRUISING OR BLEEDING  TENDERNESS IN MOUTH AND THROAT WITH OR WITHOUT PRESENCE OF ULCERS  *URINARY PROBLEMS  *BOWEL PROBLEMS  UNUSUAL RASH Items with * indicate a potential emergency and should be followed up as soon as possible.  Feel free to call the clinic should you have any questions or concerns. The clinic phone number is [phone].  Please show the CHEMO ALERT CARD at check-in to the Emergency Department and triage nurse.   

## 2019-11-12 LAB — CANCER ANTIGEN 19-9: CA 19-9: 37 U/mL — ABNORMAL HIGH (ref 0–35)

## 2019-12-01 ENCOUNTER — Encounter: Payer: Self-pay | Admitting: Pharmacy Technician

## 2019-12-01 NOTE — Progress Notes (Signed)
Patient has been approved for drug assistance by [employer] for [employer]. The enrollment period is from 11/19/19-09/10/20 based on off label use. First DOS covered is 12/02/19.

## 2019-12-02 ENCOUNTER — Inpatient Hospital Stay: Payer: Medicare PPO

## 2019-12-02 ENCOUNTER — Other Ambulatory Visit: Payer: Self-pay

## 2019-12-02 VITALS — BP 147/88 | HR 58 | Temp 97.8°F | Resp 17 | Wt 152.0 lb

## 2019-12-02 DIAGNOSIS — C169 Malignant neoplasm of stomach, unspecified: Secondary | ICD-10-CM

## 2019-12-02 DIAGNOSIS — Z79899 Other long term (current) drug therapy: Secondary | ICD-10-CM | POA: Diagnosis not present

## 2019-12-02 DIAGNOSIS — C787 Secondary malignant neoplasm of liver and intrahepatic bile duct: Secondary | ICD-10-CM | POA: Diagnosis not present

## 2019-12-02 DIAGNOSIS — C61 Malignant neoplasm of prostate: Secondary | ICD-10-CM

## 2019-12-02 DIAGNOSIS — C162 Malignant neoplasm of body of stomach: Secondary | ICD-10-CM | POA: Diagnosis not present

## 2019-12-02 DIAGNOSIS — Z5112 Encounter for antineoplastic immunotherapy: Secondary | ICD-10-CM | POA: Diagnosis not present

## 2019-12-02 DIAGNOSIS — C168 Malignant neoplasm of overlapping sites of stomach: Secondary | ICD-10-CM

## 2019-12-02 DIAGNOSIS — C799 Secondary malignant neoplasm of unspecified site: Secondary | ICD-10-CM

## 2019-12-02 DIAGNOSIS — D649 Anemia, unspecified: Secondary | ICD-10-CM

## 2019-12-02 LAB — COMPREHENSIVE METABOLIC PANEL
ALT: 63 U/L — ABNORMAL HIGH (ref 0–44)
AST: 86 U/L — ABNORMAL HIGH (ref 15–41)
Albumin: 3.3 g/dL — ABNORMAL LOW (ref 3.5–5.0)
Alkaline Phosphatase: 155 U/L — ABNORMAL HIGH (ref 38–126)
Anion gap: 6 (ref 5–15)
BUN: 12 mg/dL (ref 8–23)
CO2: 28 mmol/L (ref 22–32)
Calcium: 8.8 mg/dL — ABNORMAL LOW (ref 8.9–10.3)
Chloride: 104 mmol/L (ref 98–111)
Creatinine, Ser: 1.05 mg/dL (ref 0.61–1.24)
GFR calc Af Amer: >60 mL/min (ref >60)
GFR calc non Af Amer: >60 mL/min (ref >60)
Glucose, Bld: 115 mg/dL — ABNORMAL HIGH (ref 70–99)
Potassium: 3.5 mmol/L (ref 3.5–5.1)
Sodium: 138 mmol/L (ref 135–145)
Total Bilirubin: 0.8 mg/dL (ref 0.3–1.2)
Total Protein: 6.7 g/dL (ref 6.5–8.1)

## 2019-12-02 LAB — CBC WITH DIFFERENTIAL/PLATELET
Abs Immature Granulocytes: 0.01 10*3/uL (ref 0.00–0.07)
Basophils Absolute: 0 10*3/uL (ref 0.0–0.1)
Basophils Relative: 1 %
Eosinophils Absolute: 0.3 10*3/uL (ref 0.0–0.5)
Eosinophils Relative: 7 %
HCT: 28.8 % — ABNORMAL LOW (ref 39.0–52.0)
Hemoglobin: 8.7 g/dL — ABNORMAL LOW (ref 13.0–17.0)
Immature Granulocytes: 0 %
Lymphocytes Relative: 30 %
Lymphs Abs: 1.4 10*3/uL (ref 0.7–4.0)
MCH: 23.6 pg — ABNORMAL LOW (ref 26.0–34.0)
MCHC: 30.2 g/dL (ref 30.0–36.0)
MCV: 78 fL — ABNORMAL LOW (ref 80.0–100.0)
Monocytes Absolute: 0.5 10*3/uL (ref 0.1–1.0)
Monocytes Relative: 11 %
Neutro Abs: 2.3 10*3/uL (ref 1.7–7.7)
Neutrophils Relative %: 51 %
Platelets: 201 10*3/uL (ref 150–400)
RBC: 3.69 MIL/uL — ABNORMAL LOW (ref 4.22–5.81)
RDW: 18.6 % — ABNORMAL HIGH (ref 11.5–15.5)
WBC: 4.6 10*3/uL (ref 4.0–10.5)
nRBC: 0 % (ref 0.0–0.2)

## 2019-12-02 LAB — TSH: TSH: 1.212 u[IU]/mL (ref 0.320–4.118)

## 2019-12-02 LAB — CEA (IN HOUSE-CHCC): CEA (CHCC-In House): 52.86 ng/mL — ABNORMAL HIGH (ref 0.00–5.00)

## 2019-12-02 MED ORDER — ACETAMINOPHEN 325 MG PO TABS
ORAL_TABLET | ORAL | Status: AC
  Filled 2019-12-02: qty 2

## 2019-12-02 MED ORDER — DIPHENHYDRAMINE HCL 25 MG PO CAPS
ORAL_CAPSULE | ORAL | Status: AC
  Filled 2019-12-02: qty 1

## 2019-12-02 MED ORDER — DIPHENHYDRAMINE HCL 25 MG PO CAPS
25.0000 mg | ORAL_CAPSULE | Freq: Once | ORAL | Status: AC
  Administered 2019-12-02: 10:00:00 25 mg via ORAL

## 2019-12-02 MED ORDER — HEPARIN SOD (PORK) LOCK FLUSH 100 UNIT/ML IV SOLN
500.0000 [IU] | Freq: Once | INTRAVENOUS | Status: AC | PRN
  Administered 2019-12-02: 500 [IU]
  Filled 2019-12-02: qty 5

## 2019-12-02 MED ORDER — ACETAMINOPHEN 325 MG PO TABS
650.0000 mg | ORAL_TABLET | Freq: Once | ORAL | Status: AC
  Administered 2019-12-02: 650 mg via ORAL

## 2019-12-02 MED ORDER — SODIUM CHLORIDE 0.9 % IV SOLN
3.6000 mg/kg | Freq: Once | INTRAVENOUS | Status: AC
  Administered 2019-12-02: 240 mg via INTRAVENOUS
  Filled 2019-12-02: qty 5

## 2019-12-02 MED ORDER — SODIUM CHLORIDE 0.9 % IV SOLN
200.0000 mg | Freq: Once | INTRAVENOUS | Status: AC
  Administered 2019-12-02: 200 mg via INTRAVENOUS
  Filled 2019-12-02: qty 8

## 2019-12-02 MED ORDER — SODIUM CHLORIDE 0.9 % IV SOLN
Freq: Once | INTRAVENOUS | Status: AC
  Filled 2019-12-02: qty 250

## 2019-12-02 MED ORDER — SODIUM CHLORIDE 0.9% FLUSH
10.0000 mL | INTRAVENOUS | Status: DC | PRN
  Administered 2019-12-02: 10 mL
  Filled 2019-12-02: qty 10

## 2019-12-02 NOTE — Patient Instructions (Signed)
[facility] Discharge Instructions for Patients Receiving Chemotherapy  Today you received the following chemotherapy agents: Peter Wood  To help prevent nausea and vomiting after your treatment, we encourage you to take your nausea medication as directed.    If you develop nausea and vomiting that is not controlled by your nausea medication, call the clinic.   BELOW ARE SYMPTOMS THAT SHOULD BE REPORTED IMMEDIATELY:  *FEVER GREATER THAN 100.5 F  *CHILLS WITH OR WITHOUT FEVER  NAUSEA AND VOMITING THAT IS NOT CONTROLLED WITH YOUR NAUSEA MEDICATION  *UNUSUAL SHORTNESS OF BREATH  *UNUSUAL BRUISING OR BLEEDING  TENDERNESS IN MOUTH AND THROAT WITH OR WITHOUT PRESENCE OF ULCERS  *URINARY PROBLEMS  *BOWEL PROBLEMS  UNUSUAL RASH Items with * indicate a potential emergency and should be followed up as soon as possible.  Feel free to call the clinic should you have any questions or concerns. The clinic phone number is [phone].  Please show the [facility] at check-in to the Emergency Department and triage nurse.  Pembrolizumab injection What is this medicine? PEMBROLIZUMAB (pem broe liz ue mab) is a monoclonal antibody. It is used to treat certain types of cancer. This medicine may be used for other purposes; ask your health care provider or pharmacist if you have questions. COMMON BRAND NAME(S): Keytruda What should I tell my health care provider before I take this medicine? They need to know if you have any of these conditions:  diabetes  immune system problems  inflammatory bowel disease  liver disease  lung or breathing disease  lupus  received or scheduled to receive an organ transplant or a stem-cell transplant that uses donor stem cells  an unusual or allergic reaction to pembrolizumab, other medicines, foods, dyes, or preservatives  pregnant or trying to get pregnant  breast-feeding How should I use this medicine? This  medicine is for infusion into a vein. It is given by a health care professional in a hospital or clinic setting. A special MedGuide will be given to you before each treatment. Be sure to read this information carefully each time. Talk to your pediatrician regarding the use of this medicine in children. While this drug may be prescribed for children as young as 6 months for selected conditions, precautions do apply. Overdosage: If you think you have taken too much of this medicine contact a poison control center or emergency room at once. NOTE: This medicine is only for you. Do not share this medicine with others. What if I miss a dose? It is important not to miss your dose. Call your doctor or health care professional if you are unable to keep an appointment. What may interact with this medicine? Interactions have not been studied. Give your health care provider a list of all the medicines, herbs, non-prescription drugs, or dietary supplements you use. Also tell them if you smoke, drink alcohol, or use illegal drugs. Some items may interact with your medicine. This list may not describe all possible interactions. Give your health care provider a list of all the medicines, herbs, non-prescription drugs, or dietary supplements you use. Also tell them if you smoke, drink alcohol, or use illegal drugs. Some items may interact with your medicine. What should I watch for while using this medicine? Your condition will be monitored carefully while you are receiving this medicine. You may need blood work done while you are taking this medicine. Do not become pregnant while taking this medicine or for 4 months after stopping it.  Women should inform their doctor if they wish to become pregnant or think they might be pregnant. There is a potential for serious side effects to an unborn child. Talk to your health care professional or pharmacist for more information. Do not breast-feed an infant while taking this  medicine or for 4 months after the last dose. What side effects may I notice from receiving this medicine? Side effects that you should report to your doctor or health care professional as soon as possible:  allergic reactions like skin rash, itching or hives, swelling of the face, lips, or tongue  bloody or black, tarry  breathing problems  changes in vision  chest pain  chills  confusion  constipation  cough  diarrhea  dizziness or feeling faint or lightheaded  fast or irregular heartbeat  fever  flushing  joint pain  low blood counts - this medicine may decrease the number of white blood cells, red blood cells and platelets. You may be at increased risk for infections and bleeding.  muscle pain  muscle weakness  pain, tingling, numbness in the hands or feet  persistent headache  redness, blistering, peeling or loosening of the skin, including inside the mouth  signs and symptoms of high blood sugar such as dizziness; dry mouth; dry skin; fruity breath; nausea; stomach pain; increased hunger or thirst; increased urination  signs and symptoms of kidney injury like trouble passing urine or change in the amount of urine  signs and symptoms of liver injury like dark urine, light-colored stools, loss of appetite, nausea, right upper belly pain, yellowing of the eyes or skin  sweating  swollen lymph nodes  weight loss Side effects that usually do not require medical attention (report to your doctor or health care professional if they continue or are bothersome):  decreased appetite  hair loss  muscle pain  tiredness This list may not describe all possible side effects. Call your doctor for medical advice about side effects. You may report side effects to FDA at 1-800-FDA-1088. Where should I keep my medicine? This drug is given in a hospital or clinic and will not be stored at home. NOTE: This sheet is a summary. It may not cover all possible  information. If you have questions about this medicine, talk to your doctor, pharmacist, or health care provider.  2020 Elsevier/Gold Standard (2019-07-04 18:07:58)

## 2019-12-02 NOTE — Progress Notes (Signed)
Per Dr. Jana Hakim, okay to treat with AST of 86

## 2019-12-02 NOTE — Patient Instructions (Signed)

## 2019-12-03 LAB — CANCER ANTIGEN 19-9: CA 19-9: 65 U/mL — ABNORMAL HIGH (ref 0–35)

## 2019-12-04 ENCOUNTER — Other Ambulatory Visit: Payer: Self-pay | Admitting: Internal Medicine

## 2019-12-11 ENCOUNTER — Other Ambulatory Visit: Payer: Self-pay

## 2019-12-11 ENCOUNTER — Encounter: Payer: Self-pay | Admitting: Internal Medicine

## 2019-12-11 ENCOUNTER — Ambulatory Visit (HOSPITAL_COMMUNITY)
Admission: RE | Admit: 2019-12-11 | Discharge: 2019-12-11 | Disposition: A | Discharge status: Home / Self Care | Payer: Medicare PPO | Source: Ambulatory Visit | Attending: Oncology | Admitting: Oncology

## 2019-12-11 ENCOUNTER — Ambulatory Visit: Payer: Medicare PPO | Admitting: Internal Medicine

## 2019-12-11 ENCOUNTER — Encounter (HOSPITAL_COMMUNITY): Payer: Self-pay

## 2019-12-11 DIAGNOSIS — C787 Secondary malignant neoplasm of liver and intrahepatic bile duct: Secondary | ICD-10-CM

## 2019-12-11 DIAGNOSIS — C169 Malignant neoplasm of stomach, unspecified: Secondary | ICD-10-CM | POA: Diagnosis not present

## 2019-12-11 DIAGNOSIS — Z7189 Other specified counseling: Secondary | ICD-10-CM | POA: Insufficient documentation

## 2019-12-11 DIAGNOSIS — I10 Essential (primary) hypertension: Secondary | ICD-10-CM

## 2019-12-11 DIAGNOSIS — R634 Abnormal weight loss: Secondary | ICD-10-CM | POA: Diagnosis not present

## 2019-12-11 DIAGNOSIS — C61 Malignant neoplasm of prostate: Secondary | ICD-10-CM | POA: Diagnosis not present

## 2019-12-11 DIAGNOSIS — C799 Secondary malignant neoplasm of unspecified site: Secondary | ICD-10-CM | POA: Insufficient documentation

## 2019-12-11 DIAGNOSIS — C161 Malignant neoplasm of fundus of stomach: Secondary | ICD-10-CM | POA: Insufficient documentation

## 2019-12-11 DIAGNOSIS — I2699 Other pulmonary embolism without acute cor pulmonale: Secondary | ICD-10-CM

## 2019-12-11 DIAGNOSIS — Z85028 Personal history of other malignant neoplasm of stomach: Secondary | ICD-10-CM | POA: Diagnosis not present

## 2019-12-11 MED ORDER — IOHEXOL 300 MG/ML  SOLN
100.0000 mL | Freq: Once | INTRAMUSCULAR | Status: AC | PRN
  Administered 2019-12-11: 10:00:00 100 mL via INTRAVENOUS

## 2019-12-11 MED ORDER — SODIUM CHLORIDE (PF) 0.9 % IJ SOLN
INTRAMUSCULAR | Status: AC
  Filled 2019-12-11: qty 50

## 2019-12-11 MED ORDER — HEPARIN SOD (PORK) LOCK FLUSH 100 UNIT/ML IV SOLN
INTRAVENOUS | Status: AC
  Filled 2019-12-11: qty 5

## 2019-12-11 MED ORDER — HEPARIN SOD (PORK) LOCK FLUSH 100 UNIT/ML IV SOLN
500.0000 [IU] | Freq: Once | INTRAVENOUS | Status: AC
  Administered 2019-12-11: 11:00:00 500 [IU] via INTRAVENOUS

## 2019-12-11 MED ORDER — BENAZEPRIL HCL 40 MG PO TABS: 40.0000 mg | ORAL_TABLET | Freq: Every day | ORAL | 3 refills | Status: DC

## 2019-12-11 MED ORDER — AMLODIPINE BESYLATE 5 MG PO TABS: 5.0000 mg | ORAL_TABLET | Freq: Every day | ORAL | 3 refills | Status: DC

## 2019-12-11 NOTE — Assessment & Plan Note (Signed)
CT w/mixed results in the liver

## 2019-12-11 NOTE — Assessment & Plan Note (Signed)
BP nl at home Amlodipine, Lotensin

## 2019-12-11 NOTE — Progress Notes (Signed)
Subjective:  Patient ID: Peter Wood, male    DOB: June 20, 1935  Age: 85 y.o. MRN: [id number]  CC: No chief complaint on file.   HPI Peter Wood presents for HTN, anticoagulation, metastatic cancer f/u Feeling well  Outpatient Medications Prior to Visit  Medication Sig Dispense Refill  . amLODipine (NORVASC) 5 MG tablet TAKE ONE TABLET BY MOUTH ONE TIME DAILY  90 tablet 0  . apixaban (ELIQUIS) 2.5 MG TABS tablet Take 1 tablet (2.5 mg total) by mouth 2 (two) times daily. 60 tablet 11  . benazepril (LOTENSIN) 40 MG tablet TAKE ONE TABLET BY MOUTH ONE TIME DAILY  90 tablet 2  . Cholecalciferol 1000 UNITS tablet Take 1,000 Units by mouth daily.     Kendall Flack 575 MG/5ML SYRP Take by mouth.    . latanoprost (XALATAN) 0.005 % ophthalmic solution Place 1 drop into both eyes at bedtime.     . lidocaine-prilocaine (EMLA) cream Apply to affected area once 30 g 3  . ondansetron (ZOFRAN) 4 MG tablet Take 1 tablet (4 mg total) by mouth every 8 (eight) hours as needed for nausea or vomiting. 20 tablet 1   Facility-Administered Medications Prior to Visit  Medication Dose Route Frequency Provider Last Rate Last Admin  . heparin lock flush 100 UNIT/ML injection           . sodium chloride (PF) 0.9 % injection           . sodium chloride flush (NS) 0.9 % injection 10 mL  10 mL Intravenous PRN Magrinat, Virgie Dad, MD        ROS: Review of Systems  Constitutional: Negative for appetite change, fatigue and unexpected weight change.  HENT: Negative for congestion, nosebleeds, sneezing, sore throat and trouble swallowing.   Eyes: Negative for itching and visual disturbance.  Respiratory: Negative for cough.   Cardiovascular: Negative for chest pain, palpitations and leg swelling.  Gastrointestinal: Negative for abdominal distention, blood in stool, diarrhea and nausea.  Genitourinary: Negative for frequency and hematuria.  Musculoskeletal: Negative for back pain, gait problem, joint  swelling and neck pain.  Skin: Negative for rash.  Neurological: Negative for dizziness, tremors, speech difficulty and weakness.  Psychiatric/Behavioral: Negative for agitation, dysphoric mood and sleep disturbance. The patient is not nervous/anxious.     Objective:  BP (!) 144/84 (BP Location: Left Arm, Patient Position: Sitting, Cuff Size: Normal)   Pulse 87   Temp 98.5 F (36.9 C) (Oral)   Ht 5' 4.5" (1.638 m)   Wt 149 lb (67.6 kg)   SpO2 98%   BMI 25.18 kg/m   BP Readings from Last 3 Encounters:  12/11/19 (!) 144/84  12/02/19 (!) 147/88  11/11/19 (!) 142/82    Wt Readings from Last 3 Encounters:  12/11/19 149 lb (67.6 kg)  12/02/19 152 lb (68.9 kg)  10/27/19 148 lb 4.8 oz (67.3 kg)    Physical Exam Constitutional:      General: He is not in acute distress.    Appearance: He is well-developed.     Comments: NAD  Eyes:     Conjunctiva/sclera: Conjunctivae normal.     Pupils: Pupils are equal, round, and reactive to light.  Neck:     Thyroid: No thyromegaly.     Vascular: No JVD.  Cardiovascular:     Rate and Rhythm: Normal rate and regular rhythm.     Heart sounds: Normal heart sounds. No murmur. No friction rub. No gallop.   Pulmonary:  Effort: Pulmonary effort is normal. No respiratory distress.     Breath sounds: Normal breath sounds. No wheezing or rales.  Chest:     Chest wall: No tenderness.  Abdominal:     General: Bowel sounds are normal. There is no distension.     Palpations: Abdomen is soft. There is no mass.     Tenderness: There is no abdominal tenderness. There is no guarding or rebound.  Musculoskeletal:        General: No tenderness. Normal range of motion.     Cervical back: Normal range of motion.  Lymphadenopathy:     Cervical: No cervical adenopathy.  Skin:    General: Skin is warm and dry.     Findings: No rash.  Neurological:     Mental Status: He is alert and oriented to person, place, and time.     Cranial Nerves: No cranial  nerve deficit.     Motor: No abnormal muscle tone.     Coordination: Coordination normal.     Gait: Gait normal.     Deep Tendon Reflexes: Reflexes are normal and symmetric.  Psychiatric:        Behavior: Behavior normal.        Thought Content: Thought content normal.        Judgment: Judgment normal.   not jaundiced  Lab Results  Component Value Date   WBC 4.6 12/02/2019   HGB 8.7 (L) 12/02/2019   HCT 28.8 (L) 12/02/2019   PLT 201 12/02/2019   GLUCOSE 115 (H) 12/02/2019   CHOL 193 01/18/2018   TRIG 84.0 01/18/2018   HDL 50.50 01/18/2018   LDLDIRECT 126.3 09/23/2007   LDLCALC 126 (H) 01/18/2018   ALT 63 (H) 12/02/2019   AST 86 (H) 12/02/2019   NA 138 12/02/2019   K 3.5 12/02/2019   CL 104 12/02/2019   CREATININE 1.05 12/02/2019   BUN 12 12/02/2019   CO2 28 12/02/2019   TSH 1.212 12/02/2019   PSA 0.54 09/18/2018   INR 1.4 11/04/2018    CT Abdomen Pelvis W Contrast  Result Date: 12/11/2019 CLINICAL DATA:  History of gastric cancer. Follow-up metastatic hepatic disease. Ongoing chemotherapy and immunotherapy. EXAM: CT ABDOMEN AND PELVIS WITH CONTRAST TECHNIQUE: Multidetector CT imaging of the abdomen and pelvis was performed using the standard protocol following bolus administration of intravenous contrast. CONTRAST:  178mL OMNIPAQUE IOHEXOL 300 MG/ML  SOLN COMPARISON:  CT scan 09/08/2019 FINDINGS: Lower chest: Stable dense parenchymal scarring changes at both lung bases. No discrete nodules. No pleural effusion. Hepatobiliary: Segment 8 liver lesion measures 5.1 x 4.8 cm on image 12/2. This previously measured approximately 4.2 x 3.7 cm. Segment 7 lesion measures approximately 2.5 cm in the transverse dimension and previously measured 3.0 cm. Segment 4A lesion on image 15/2 measures 3.3 x 3.0 cm and previously measured 2.4 x 2.1 cm. Segment 4B lesion may be 2 adjacent lesions but measures a total of 2.9 x 1.9 cm and previously measured 3.1 x 2.9 cm. Segment 6 lesion measures a  maximum of 16 mm and previously measured 18 mm. No new lesions are identified. The gallbladder appears normal. No common bile duct dilatation. Pancreas: No mass, inflammation or ductal dilatation. Spleen: Normal size. No focal lesions. Adrenals/Urinary Tract: The adrenal glands and kidneys are unremarkable and stable. Small renal cysts are noted. The bladder appears normal. Stomach/Bowel: Mild to moderate wall thickening involving the or antral region of the stomach may reflect the patient's gastric tumor. Adjacent adenopathy measures 2.0  x 1.8 cm on image 29/2. This appears grossly stable. The small bowel and colon are unremarkable. Vascular/Lymphatic: Stable tortuosity, ectasia and calcification of the abdominal aorta. 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 No retroperitoneal or pelvic adenopathy. Reproductive: Mild prostate gland enlargement. Brachytherapy seeds are noted. Other: No pelvic mass or pelvic adenopathy. No free pelvic fluid collections. No inguinal mass or adenopathy. Musculoskeletal: No acute bony findings or worrisome bone lesions. IMPRESSION: 1. Mixed findings in the liver with some lesions being larger and some lesions smaller when compared to prior study. No new lesions are identified. 2. Stable gastric wall thickening and adjacent adenopathy. 3. Stable advanced atherosclerotic calcifications involving the aorta and iliac arteries. 4. Stable dense parenchymal scarring changes at the lung bases. Aortic Atherosclerosis (ICD10-I70.0). Electronically Signed   By: Marijo Sanes M.D.   On: 12/11/2019 11:20    Assessment & Plan:    Walker Kehr, MD

## 2019-12-11 NOTE — Assessment & Plan Note (Signed)
Wt Readings from Last 3 Encounters:  12/11/19 149 lb (67.6 kg)  12/02/19 152 lb (68.9 kg)  10/27/19 148 lb 4.8 oz (67.3 kg)

## 2019-12-11 NOTE — Assessment & Plan Note (Signed)
On Eliquis

## 2019-12-14 NOTE — Progress Notes (Signed)
[facility]  [phone] [phone]     ID: Peter Wood DOB: 02-Apr-1935  MR#: [id number]  ZDG#:[id number]  Patient Care Team: Cassandria Anger, MD as PCP - General Milus Banister, MD as Attending Physician (Gastroenterology) Trey Gulbranson, Virgie Dad, MD as Consulting Physician (Oncology) Gatha Mayer, MD as Consulting Physician (Gastroenterology) [name], MD as Consulting Physician (Ophthalmology) Ramon Dredge, MD as Referring Physician (Radiation Oncology) Larey Dresser, MD as Consulting Physician (Cardiology) OTHER MD:   CHIEF COMPLAINT: Metastatic gastric adenocarcinoma  CURRENT TREATMENT: pembrolizumab, [T-DM1]   INTERVAL HISTORY: Peter Wood returns today for follow-up and treatment of his metastatic gastric adenocarcinoma accompanied by his wife [name].   His most recent echocardiogram on 08/25/2019 showed an ejection fraction of 60-65%.  His next echocardiogram is scheduled for 12/29/2019  He was started on T-DM1/Kadcyla on 09/29/2018.  He has tolerated this generally well: Although he feels tired the evening of treatment.  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  He has had no nausea or vomiting no altered taste and no loss of appetite.  He is maintaining his weight quite well.  He started on pembrolizumab /Keytruda with his 4th cycle of Kadcyla on 12/02/2019.  He has had no diarrhea or rash and no other symptoms related to this that he is aware of  Since his last visit, he underwent restaging abdomen/pelvis CT on 12/11/2019, which showed: mixed findings in the liver, no new lesions identified; stable exam otherwise.  Lab Results  Component Value Date   RJJ884 16 (H) 12/02/2019   SAY301 37 (H) 11/11/2019   SWF093 48 (H) 10/21/2019   ATF573 40 (H) 09/30/2019   UKG254 50 (H) 09/09/2019    REVIEW OF SYSTEMS: Peter Wood to do remarkably well.  They had an uneventful Easter, taking care of each other  as he puts it.  They are very concerned regarding changes in his tumor marker, but there is no very evident trend and considering that the markers were quite high at the start we are still doing well in that regard.  HISTORY OF CURRENT ILLNESS: From the original intake note:  Peter Wood presented to the emergency room 09/07/2018 with mid/upper abdominal pain.  He reported weight loss of 25 pounds over the past 6 months and significant anorexia.  His liver function tests were abnormal and a right upper quadrant ultrasound was obtained.  This was read as concerning for malignancy and a CT of the abdomen and pelvis with contrast was obtained the same day, showing  1. Too numerous to count hypodense masses of the liver concerning for hepatic metastasis. In the region of the porta hepatis, there are hypodense lesions more likely associated with the liver though the pancreatic head is partially obscured by a 3.2 cm hypodense mass. Findings are more likely associated with the liver and less likely an isolated pancreatic mass. 2. Upper abdominal mesenteric and peripancreatic lymphadenopathy suspicious for metastatic disease. 3. 6 mm sclerotic density in the left iliac bone adjacent to the SI joint. Given history of prostate cancer, differential considerations would include osteoblastic metastasis or possibly bone island. Showed only diverticular disease.  He was referred to GI and Dr. Ardis Hughs notes a colonoscopy performed 04/23/2017 makes the possibility of colon cancer unlikely.  Further work-up was planned, but on 09/19/2018 the patient again presented to the emergency room with chest pain, now in a different area, and a staging CT scan of the chest  showed filling defects in the right lower lobe pulmonary arteries consistent with embolism.  There was a small pericardial effusion and an additional 2.4 cm left thyroid mass.  Aortic atherosclerosis was noted  He was started on intravenous heparin and on  09/20/2018 underwent upper endoscopy under Dr. Carlean Purl showing a giant nonbleeding cratered gastric ulcer in the posterior wall of the stomach.  Biopsies of this procedure showed (SZA 20-187) adenocarcinoma.   At that point we were consulted and we obtained tumor markers which included a CEA of 1318.2, a CA 19-9 of 67,479, a normal AFP and a normal PSA.  I discussed the case with pathology and they do not feel that additional testing would be helpful in terms of discriminating between a primary gastric and a primary biliary/pancreatic tumor.  The patient's subsequent history is as detailed below.   PAST MEDICAL HISTORY: Past Medical History:  Diagnosis Date  . Arthritis   . Diverticulosis of colon   . Family history of breast cancer   . Family history of prostate cancer   . HTN (hypertension)   . Hx of colonic polyp   . Pneumonia    as a child/baby  . Poor circulation of extremity    right leg  . Prostate cancer ([place]) 2015   prostate cancer - radiation treated    PAST SURGICAL HISTORY: Past Surgical History:  Procedure Laterality Date  . [place] VITRECTOMY WITH 20 GAUGE MVR PORT FOR MACULAR HOLE Right 09/19/2016   Procedure: 25 GAUGE PARS PLANA VITRECTOMY WITH 20 GAUGE MVR PORT FOR MACULAR HOLE, MEMBRANE PEEL, SERUM PATCH, GAS FLUID EXCHANGE, HEADSCOPE LASER;  Surgeon: [name], MD;  Location: [facility];  Service: Ophthalmology;  Laterality: Right;  . BIOPSY  09/20/2018   Procedure: BIOPSY;  Surgeon: Gatha Mayer, MD;  Location: [facility] ENDOSCOPY;  Service: Endoscopy;;  . CATARACT EXTRACTION  08/27/12   Right  . COLONOSCOPY    . ESOPHAGOGASTRODUODENOSCOPY (EGD) WITH PROPOFOL N/A 09/20/2018   Procedure: ESOPHAGOGASTRODUODENOSCOPY (EGD) WITH PROPOFOL;  Surgeon: Gatha Mayer, MD;  Location: [facility];  Service: Endoscopy;  Laterality: N/A;  . HAND SURGERY  2016   right thumb  . IR IMAGING GUIDED PORT INSERTION  11/04/2018  . KNEE SURGERY Left 1989    FAMILY  HISTORY: Family History  Problem Relation Age of Onset  . Prostate cancer Father        dx >50  . Hypertension Mother   . Ulcers Mother   . Breast cancer Sister        dx 20's  . Prostate cancer Brother        dx under 50, metasttic, cause of his death  . Ulcers Brother   . Ulcers Sister    Samnang's father died from heart complications with a pacemaker at age 48; Peter Wood's father also has prostate cancer. Patients' mother died from natural causes at age 35. The patient has 4 brothers and 3 sisters. One of [place] sisters had breast cancer in her 73s and a brother had prostate cancer. Patient denies anyone in her family having ovarian, or pancreatic cancer. Peter Wood mentions that his youngest sister and his brother with prostate cancer also had stomach ulcers.    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 His wife, [name], is a retired [employer] summer Radiographer, therapeutic. As of 09/2018, they have been married for 56 years. They have 4 children, Claiborne Billings, [place], [place], and [place]. Claiborne Billings lives in [place] and works at the  [employer]. Sherrod lives in [place] and works at [employer] and as a part Horticulturist, commercial. Germane lives in [place] and is a Engineer, drilling for a medical company. Beverely Low lives in [place] and works at [employer]. Heron has 7 grandchildren and 1 step great grandchild. He attends [employer].    ADVANCED DIRECTIVES: His wife, [name], is automatically his healthcare power of attorney.     HEALTH MAINTENANCE: Social History   Tobacco Use  . Smoking status: Former Smoker    Years: 4.00    Quit date: 09/12/1967    Years since quitting: 52.2  . Smokeless tobacco: Never Used  Substance Use Topics  . Alcohol use: No  . Drug use: No    Colonoscopy: 2018/[facility]  PSA: 0.54 on 09/18/2018  No Known Allergies  Current Outpatient Medications  Medication Sig Dispense Refill  . amLODipine (NORVASC) 5 MG tablet Take 1 tablet (5 mg total) by  mouth daily. 90 tablet 3  . apixaban (ELIQUIS) 2.5 MG TABS tablet Take 1 tablet (2.5 mg total) by mouth 2 (two) times daily. 60 tablet 11  . benazepril (LOTENSIN) 40 MG tablet Take 1 tablet (40 mg total) by mouth daily. 90 tablet 3  . Cholecalciferol 1000 UNITS tablet Take 1,000 Units by mouth daily.     Marland Kitchen dexamethasone (DECADRON) 4 MG tablet Take 2 tablets (8 mg total) by mouth daily. Start the day after chemotherapy for 2 days. 30 tablet 1  . Elderberry 575 MG/5ML SYRP Take by mouth.    . latanoprost (XALATAN) 0.005 % ophthalmic solution Place 1 drop into both eyes at bedtime.     . lidocaine-prilocaine (EMLA) cream Apply to affected area once 30 g 3  . ondansetron (ZOFRAN) 4 MG tablet Take 1 tablet (4 mg total) by mouth every 8 (eight) hours as needed for nausea or vomiting. 20 tablet 1  . prochlorperazine (COMPAZINE) 10 MG tablet Take 1 tablet (10 mg total) by mouth every 6 (six) hours as needed (Nausea or vomiting). 30 tablet 1   No current facility-administered medications for this visit.   Facility-Administered Medications Ordered in Other Visits  Medication Dose Route Frequency Provider Last Rate Last Admin  . sodium chloride flush (NS) 0.9 % injection 10 mL  10 mL Intravenous PRN Brenya Taulbee, Virgie Dad, MD        OBJECTIVE:  African-American man in no acute distress  Vitals:   12/16/19 1521  BP: (!) 142/81  Pulse: 69  Resp: 17  Temp: 99.1 F (37.3 C)  SpO2: 100%   Wt Readings from Last 3 Encounters:  12/16/19 152 lb 11.2 oz (69.3 kg)  12/11/19 149 lb (67.6 kg)  12/02/19 152 lb (68.9 kg)   Body mass index is 25.81 kg/m.    ECOG FS:1 - Symptomatic but completely ambulatory  Sclerae unicteric, EOMs intact Wearing a mask No cervical or supraclavicular adenopathy Lungs no rales or rhonchi Heart regular rate and rhythm Abd soft, nontender, positive bowel sounds, no masses palpated MSK no focal spinal tenderness Neuro: nonfocal, well oriented, appropriate affect   LAB  RESULTS:  CMP     Component Value Date/Time   NA 138 12/02/2019 0854   K 3.5 12/02/2019 0854   CL 104 12/02/2019 0854   CO2 28 12/02/2019 0854   GLUCOSE 115 (H) 12/02/2019 0854   GLUCOSE 105 (H) 09/13/2006 0900   BUN 12 12/02/2019 0854   CREATININE 1.05 12/02/2019 0854   CREATININE 1.02 10/16/2018 0936   CALCIUM  8.8 (L) 12/02/2019 0854   PROT 6.7 12/02/2019 0854   ALBUMIN 3.3 (L) 12/02/2019 0854   AST 86 (H) 12/02/2019 0854   AST 76 (H) 10/16/2018 0936   ALT 63 (H) 12/02/2019 0854   ALT 47 (H) 10/16/2018 0936   ALKPHOS 155 (H) 12/02/2019 0854   BILITOT 0.8 12/02/2019 0854   BILITOT 0.7 10/16/2018 0936   GFRNONAA >60 12/02/2019 0854   GFRNONAA >60 10/16/2018 0936   GFRAA >60 12/02/2019 0854   GFRAA >60 10/16/2018 0936    No results found for: TOTALPROTELP, ALBUMINELP, A1GS, A2GS, BETS, BETA2SER, GAMS, MSPIKE, SPEI  No results found for: KPAFRELGTCHN, LAMBDASER, [facility]  Lab Results  Component Value Date   WBC 4.6 12/02/2019   NEUTROABS 2.3 12/02/2019   HGB 8.7 (L) 12/02/2019   HCT 28.8 (L) 12/02/2019   MCV 78.0 (L) 12/02/2019   PLT 201 12/02/2019    No results found for: LABCA2  No components found for: [id number]  No results for input(s): INR in the last 168 hours.  No results found for: LABCA2  Lab Results  Component Value Date   WUJ811 49 (H) 12/02/2019    No results found for: BJY782  No results found for: NFA213  No results found for: CA2729  No components found for: HGQUANT  Lab Results  Component Value Date   CEA1 52.86 (H) 12/02/2019   /  CEA ([facility])  Date Value Ref Range Status  12/02/2019 52.86 (H) 0.00 - 5.00 ng/mL Final    Comment:    (NOTE) This test was performed using Architect's Chemiluminescent Microparticle Immunoassay. Values obtained from different assay methods cannot be used interchangeably. Please note that 5-10% of patients who smoke may see CEA levels up to 6.9 ng/mL. Performed at [facility], [place] [address] [place], [place] [address]      No results found for: AFPTUMOR  No results found for: CHROMOGRNA  No results found for: HGBA, HGBA2QUANT, HGBFQUANT, HGBSQUAN (Hemoglobinopathy evaluation)   No results found for: LDH  Lab Results  Component Value Date   IRON 38 (L) 09/21/2018   TIBC 251 09/21/2018   IRONPCTSAT 15 (L) 09/21/2018   (Iron and TIBC)  Lab Results  Component Value Date   FERRITIN 138 09/21/2018    Urinalysis    Component Value Date/Time   COLORURINE YELLOW 02/18/2019 1056   APPEARANCEUR CLEAR 02/18/2019 1056   LABSPEC 1.018 02/18/2019 1056   PHURINE 6.0 02/18/2019 1056   GLUCOSEU NEGATIVE 02/18/2019 1056   GLUCOSEU NEGATIVE 07/20/2017 1056   HGBUR NEGATIVE 02/18/2019 1056   [place] 02/18/2019 1056   KETONESUR NEGATIVE 02/18/2019 1056   PROTEINUR 30 (A) 02/18/2019 1056   UROBILINOGEN 0.2 07/20/2017 1056   NITRITE NEGATIVE 02/18/2019 [place] 02/18/2019 1056    STUDIES:  CT Abdomen Pelvis W Contrast  Result Date: 12/11/2019 CLINICAL DATA:  History of gastric cancer. Follow-up metastatic hepatic disease. Ongoing chemotherapy and immunotherapy. EXAM: CT ABDOMEN AND PELVIS WITH CONTRAST TECHNIQUE: Multidetector CT imaging of the abdomen and pelvis was performed using the standard protocol following bolus administration of intravenous contrast. CONTRAST:  181m OMNIPAQUE IOHEXOL 300 MG/ML  SOLN COMPARISON:  CT scan 09/08/2019 FINDINGS: Lower chest: Stable dense parenchymal scarring changes at both lung bases. No discrete nodules. No pleural effusion. Hepatobiliary: Segment 8 liver lesion measures 5.1 x 4.8 cm on image 12/2. This previously measured approximately 4.2 x 3.7 cm. Segment 7 lesion measures approximately 2.5 cm in the transverse dimension  and previously measured 3.0 cm. Segment 4A lesion on image 15/2 measures 3.3 x 3.0 cm and previously measured 2.4 x 2.1 cm. Segment 4B lesion may be 2  adjacent lesions but measures a total of 2.9 x 1.9 cm and previously measured 3.1 x 2.9 cm. Segment 6 lesion measures a maximum of 16 mm and previously measured 18 mm. No new lesions are identified. The gallbladder appears normal. No common bile duct dilatation. Pancreas: No mass, inflammation or ductal dilatation. Spleen: Normal size. No focal lesions. Adrenals/Urinary Tract: The adrenal glands and kidneys are unremarkable and stable. Small renal cysts are noted. The bladder appears normal. Stomach/Bowel: Mild to moderate wall thickening involving the or antral region of the stomach may reflect the patient's gastric tumor. Adjacent adenopathy measures 2.0 x 1.8 cm on image 29/2. This appears grossly stable. The small bowel and colon are unremarkable. Vascular/Lymphatic: Stable tortuosity, ectasia and calcification of the abdominal aorta. 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 No retroperitoneal or pelvic adenopathy. Reproductive: Mild prostate gland enlargement. Brachytherapy seeds are noted. Other: No pelvic mass or pelvic adenopathy. No free pelvic fluid collections. No inguinal mass or adenopathy. Musculoskeletal: No acute bony findings or worrisome bone lesions. IMPRESSION: 1. Mixed findings in the liver with some lesions being larger and some lesions smaller when compared to prior study. No new lesions are identified. 2. Stable gastric wall thickening and adjacent adenopathy. 3. Stable advanced atherosclerotic calcifications involving the aorta and iliac arteries. 4. Stable dense parenchymal scarring changes at the lung bases. Aortic Atherosclerosis (ICD10-I70.0). Electronically Signed   By: Marijo Sanes M.D.   On: 12/11/2019 11:20     ELIGIBLE FOR AVAILABLE RESEARCH PROTOCOL: no   ASSESSMENT: 85 y.o. [place], [place] man status post gastric biopsy 09/20/2018 showing adenocarcinoma, with a CA 19-9 greater than 65,000; staging studies showing an apparent mass adjacent to the  head of the pancreas, innumerable liver lesions, upper abdominal mesenteric and peripancreatic lymphadenopathy, and an isolated sclerotic density in the left iliac bone  (a) genomics requested on gastric biopsy showed the tumor to be HER-2 amplified at 3+, PD-L1 positive, and T p53 mutated.  MSI is stable, mismatch repair status is proficient and the tumor mutational burden is intermediate.  (1) chest CT scan 09/19/2018 shows right lower lobe pulmonary embolism  (a) on intravenous heparin started 09/19/2018, transitioned to apixaban 10/01/2018  (2) genetics testing 10/04/2018: negative  (a) family history of prostate and early breast cancer; patient has a history of prostate cancer  (3) started gemcitabine/Abraxane 10/09/2018, repeated days 1, 8 and 15 of each 28-day cycle  (a) stopped after 1 cycle (3 doses) with reassessment of diagnosis based on CARIS results, noted above  (4) started oxaliplatin, capecitabine and trastuzumab 11/06/2018  (a) baseline echocardiogram 09/20/2018 shows an ejection fraction in the 55-60% range  (b) oxaliplatin and trastuzumab every 21 days started 11/06/2018  (c) capecitabine dose 1000 mg twice daily for 14 days of every 21-day cycle  (d) CT of the abdomen and pelvis 02/11/2019 shows marked improvement in his liver lesions and resolution of periportal adenopathy  (e) tumor markers also show significant response,  (f) echocardiogram 02/12/2019 shows an ejection fraction in the 60-65% range  (g) CT abd/pelvis show stable/ improved disease, tumor markers nearly normal  (h) oxaliplatin stopped aftwer 04/30/2019 dose  (i) capecitabine stopped after September cycle   (5) trastuzumab maintenance started 05/20/2019, last dose 07/22/2019, with progression  (6) disease progression documented by lab work and CT scan  07/16/2019  (a) resumed FOLFOX 07/29/2019, continuing trastuzumab (every 14 days).  (b) restaging studies after 3 cycles showed evidence of progression  (tumor markers equivocal)  (c) FOLFOX discontinued 08/26/2019  (7) started T-DM1 09/30/2019, repeat every 21 days  (a) pembrolizumab added 12/02/2019  (b) echocardiogram 08/25/2019 showed an ejection fraction in the 60-65% range  (c) repeat CT of the abdomen 12/11/2019 shows growth in 2 liver lesions  (8) to start [facility] 12/23/2019    PLAN: Ashlee tolerated T-DM1 generally well, but at least 2 of his liver lesions have grown some.  I think we need to move to Enhertu, which was recently approved in this setting and which has a very good track record here.  Today we discussed the possible toxicities side effects and complications of this agent and he understands there may be some more problems with nausea and fatigue.  He will receive dexamethasone and Compazine for nausea on days 2 and 3.  Just in case I am going to see him about a week after his first dose to make sure everything Wood well  Total encounter time today 35 minutes.Sarajane Jews C. Shakedra Beam, MD 12/16/19 4:05 PM Medical Oncology and Hematology [facility] [place], [place] [address] Tel. [phone]    Fax. [phone]   I, Wilburn Mylar, am acting as scribe for Dr. Virgie Dad. Zanyla Klebba.  I, Lurline Del MD, have reviewed the above documentation for accuracy and completeness, and I agree with the above.   *Total Encounter Time as defined by the Centers for Medicare and Medicaid Services includes, in addition to the face-to-face time of a patient visit (documented in the note above) non-face-to-face time: obtaining and reviewing outside history, ordering and reviewing medications, tests or procedures, care coordination (communications with other health care professionals or caregivers) and documentation in the medical record.

## 2019-12-16 ENCOUNTER — Other Ambulatory Visit: Payer: Self-pay

## 2019-12-16 ENCOUNTER — Inpatient Hospital Stay: Payer: Medicare PPO | Attending: Oncology | Admitting: Oncology

## 2019-12-16 VITALS — BP 142/81 | HR 69 | Temp 99.1°F | Resp 17 | Ht 64.5 in | Wt 152.7 lb

## 2019-12-16 DIAGNOSIS — Z5112 Encounter for antineoplastic immunotherapy: Secondary | ICD-10-CM | POA: Insufficient documentation

## 2019-12-16 DIAGNOSIS — C169 Malignant neoplasm of stomach, unspecified: Secondary | ICD-10-CM | POA: Diagnosis not present

## 2019-12-16 DIAGNOSIS — Z803 Family history of malignant neoplasm of breast: Secondary | ICD-10-CM | POA: Diagnosis not present

## 2019-12-16 DIAGNOSIS — T451X5A Adverse effect of antineoplastic and immunosuppressive drugs, initial encounter: Secondary | ICD-10-CM

## 2019-12-16 DIAGNOSIS — C799 Secondary malignant neoplasm of unspecified site: Secondary | ICD-10-CM

## 2019-12-16 DIAGNOSIS — Z7189 Other specified counseling: Secondary | ICD-10-CM | POA: Diagnosis not present

## 2019-12-16 DIAGNOSIS — C168 Malignant neoplasm of overlapping sites of stomach: Secondary | ICD-10-CM | POA: Diagnosis not present

## 2019-12-16 DIAGNOSIS — Z79899 Other long term (current) drug therapy: Secondary | ICD-10-CM | POA: Diagnosis not present

## 2019-12-16 DIAGNOSIS — C787 Secondary malignant neoplasm of liver and intrahepatic bile duct: Secondary | ICD-10-CM | POA: Diagnosis not present

## 2019-12-16 DIAGNOSIS — Z8546 Personal history of malignant neoplasm of prostate: Secondary | ICD-10-CM | POA: Insufficient documentation

## 2019-12-16 DIAGNOSIS — Z794 Long term (current) use of insulin: Secondary | ICD-10-CM | POA: Diagnosis not present

## 2019-12-16 DIAGNOSIS — C162 Malignant neoplasm of body of stomach: Secondary | ICD-10-CM | POA: Diagnosis not present

## 2019-12-16 DIAGNOSIS — Z95828 Presence of other vascular implants and grafts: Secondary | ICD-10-CM | POA: Diagnosis not present

## 2019-12-16 DIAGNOSIS — D509 Iron deficiency anemia, unspecified: Secondary | ICD-10-CM | POA: Diagnosis not present

## 2019-12-16 DIAGNOSIS — D6481 Anemia due to antineoplastic chemotherapy: Secondary | ICD-10-CM | POA: Diagnosis not present

## 2019-12-16 DIAGNOSIS — Z86711 Personal history of pulmonary embolism: Secondary | ICD-10-CM | POA: Diagnosis not present

## 2019-12-16 DIAGNOSIS — E109 Type 1 diabetes mellitus without complications: Secondary | ICD-10-CM | POA: Diagnosis not present

## 2019-12-16 DIAGNOSIS — Z87891 Personal history of nicotine dependence: Secondary | ICD-10-CM | POA: Diagnosis not present

## 2019-12-16 DIAGNOSIS — Z7901 Long term (current) use of anticoagulants: Secondary | ICD-10-CM | POA: Insufficient documentation

## 2019-12-16 MED ORDER — DEXAMETHASONE 4 MG PO TABS: 8.0000 mg | ORAL_TABLET | Freq: Every day | ORAL | 1 refills | Status: DC

## 2019-12-16 MED ORDER — PROCHLORPERAZINE MALEATE 10 MG PO TABS: 10.0000 mg | ORAL_TABLET | Freq: Four times a day (QID) | ORAL | 1 refills | Status: DC | PRN

## 2019-12-16 NOTE — Progress Notes (Signed)
DISCONTINUE OFF PATHWAY REGIMEN - Gastroesophageal   [id number]:Fam-trastuzumab deruxtecan-nxki 5.4 mg/kg IV D1 q21 Days:   A cycle is every 21 days:     Fam-trastuzumab deruxtecan-nxki   **Always confirm dose/schedule in your pharmacy ordering system**  REASON: Other Reason PRIOR TREATMENT: Off Pathway: Fam-trastuzumab deruxtecan-nxki 5.4 mg/kg IV D1 q21 Days TREATMENT RESPONSE: Progressive Disease (PD)  START OFF PATHWAY REGIMEN - Gastroesophageal   [id number]:Fam-trastuzumab deruxtecan-nxki 5.4 mg/kg IV D1 q21 Days:   A cycle is every 21 days:     Fam-trastuzumab deruxtecan-nxki   **Always confirm dose/schedule in your pharmacy ordering system**  Patient Characteristics: Distant Metastases (cM1/pM1) / Locally Recurrent Disease, Adenocarcinoma - Esophageal, GE Junction, and Gastric, Third Line and Beyond, MSS/pMMR or MSI Unknown, PD?L1 Expression  Positive(CPS ? 1) Histology: Adenocarcinoma Disease Classification: Gastric Therapeutic Status: Distant Metastases (No Additional Staging) Line of Therapy: Third Engineer, civil (consulting) Status: Unknown PD-L1 Expression Status: PD-L1 Positive (CPS ? 1) Prior Immunotherapy Status: No Prior PD-1/PD-L1 Inhibitor Intent of Therapy: Non-Curative / Palliative Intent, Discussed with Patient

## 2019-12-17 NOTE — Progress Notes (Signed)
Pharmacist Chemotherapy Monitoring - Initial Assessment    Anticipated start date: 12/23/19   Regimen:  . Are orders appropriate based on the patient's diagnosis, regimen, and cycle? Yes . Does the plan date match the patient's scheduled date? Yes . Is the sequencing of drugs appropriate? Yes . Are the premedications appropriate for the patient's regimen? Yes . Prior Authorization for treatment is: Pending o If applicable, is the correct biosimilar selected based on the patient's insurance? not applicable  Organ Function and Labs: Marland Kitchen Are dose adjustments needed based on the patient's renal function, hepatic function, or hematologic function? No, however, dose of Enhertu is 6.13m/kg in the setting of HER2+ gastric cancer. Will confirm starting dose with provider.  . Are appropriate labs ordered prior to the start of patient's treatment? Yes . Other organ system assessment, if indicated: trastuzumab-deruztecan: Echo/ MUGA . The following baseline labs, if indicated, have been ordered: N/A  Dose Assessment: . Are the drug doses appropriate? See comment above . Are the following correct: o Drug concentrations Yes o IV fluid compatible with drug Yes o Administration routes Yes o Timing of therapy Yes . If applicable, does the patient have documented access for treatment and/or plans for port-a-cath placement? not applicable . If applicable, have lifetime cumulative doses been properly documented and assessed? not applicable Lifetime Dose Tracking  . Oxaliplatin: 1,019.323 mg/m2 (1,750 mg) = 169.89 % of the maximum lifetime dose of 600 mg/m2  o   Toxicity Monitoring/Prevention: . The patient has the following take home antiemetics prescribed: Prochlorperazine . The patient has the following take home medications prescribed: N/A . Medication allergies and previous infusion related reactions, if applicable, have been reviewed and addressed. Yes . The patient's current medication list has  been assessed for drug-drug interactions with their chemotherapy regimen. no significant drug-drug interactions were identified on review.  Order Review: . Are the treatment plan orders signed? Yes . Is the patient scheduled to see a provider prior to their treatment? No  I verify that I have reviewed each item in the above checklist and answered each question accordingly.  MNorwood LevoSAdvanced Outpatient Surgery Of [facility] LLC4/03/2020 9:59 AM

## 2019-12-18 ENCOUNTER — Other Ambulatory Visit: Payer: Self-pay | Admitting: Oncology

## 2019-12-18 ENCOUNTER — Telehealth: Payer: Self-pay | Admitting: *Deleted

## 2019-12-18 ENCOUNTER — Telehealth: Payer: Self-pay | Admitting: Oncology

## 2019-12-18 NOTE — Telephone Encounter (Signed)
Scheduled appts per 4/6 los. Pt confirmed appt date and time.  °

## 2019-12-18 NOTE — Progress Notes (Signed)
Mr. Aresco insurance has approved Enhertu alone, but would not approve it if given together with the pembrolizumab.  I have stopped the pembrolizumab.

## 2019-12-18 NOTE — Progress Notes (Signed)
Dose of Enhertu 5.4 mg/kg confirmed with Dr. Jana Hakim. Will keep at lower dose for age and consider increasing to the higher gastric dose depending on how patient tolerates.   Demetrius Charity, PharmD, BCPS, [place] [facility] Pharmacist Pharmacy Phone: [phone] 12/18/2019

## 2019-12-18 NOTE — Telephone Encounter (Signed)
This RN spoke with pt per his call regarding " sheet I have says I should start on pills for my energy"  [name] states he received a letter from the dietician stating per labwork he could benefit by taking iron tablets.  This RN reviewed lab and noted low heme of 8.7 and MCV of 78.  Above has decreased slowly with prior readings of heme >10 and MCV >90.  Pt has not had recent ferritin level checked.  This RN informed Peter Wood - best to hold on oral iron replacement at present - and to discuss above at his next MD appointment on 4/16- MD may want to do IV replacement due to less side effects.  Peter Wood verbalized understanding as well as need to obtain supportive medications for new chemo start from his pharmacy - no further needs at this time.

## 2019-12-23 ENCOUNTER — Inpatient Hospital Stay: Payer: Medicare PPO

## 2019-12-23 ENCOUNTER — Other Ambulatory Visit: Payer: Self-pay | Admitting: Oncology

## 2019-12-23 ENCOUNTER — Other Ambulatory Visit: Payer: Self-pay

## 2019-12-23 VITALS — BP 152/97 | HR 60 | Temp 98.5°F | Resp 18 | Wt 154.2 lb

## 2019-12-23 DIAGNOSIS — C787 Secondary malignant neoplasm of liver and intrahepatic bile duct: Secondary | ICD-10-CM

## 2019-12-23 DIAGNOSIS — D509 Iron deficiency anemia, unspecified: Secondary | ICD-10-CM | POA: Diagnosis not present

## 2019-12-23 DIAGNOSIS — C799 Secondary malignant neoplasm of unspecified site: Secondary | ICD-10-CM

## 2019-12-23 DIAGNOSIS — Z79899 Other long term (current) drug therapy: Secondary | ICD-10-CM | POA: Diagnosis not present

## 2019-12-23 DIAGNOSIS — Z7189 Other specified counseling: Secondary | ICD-10-CM

## 2019-12-23 DIAGNOSIS — Z95828 Presence of other vascular implants and grafts: Secondary | ICD-10-CM

## 2019-12-23 DIAGNOSIS — D649 Anemia, unspecified: Secondary | ICD-10-CM

## 2019-12-23 DIAGNOSIS — Z803 Family history of malignant neoplasm of breast: Secondary | ICD-10-CM | POA: Diagnosis not present

## 2019-12-23 DIAGNOSIS — C61 Malignant neoplasm of prostate: Secondary | ICD-10-CM

## 2019-12-23 DIAGNOSIS — C168 Malignant neoplasm of overlapping sites of stomach: Secondary | ICD-10-CM

## 2019-12-23 DIAGNOSIS — C169 Malignant neoplasm of stomach, unspecified: Secondary | ICD-10-CM

## 2019-12-23 DIAGNOSIS — Z794 Long term (current) use of insulin: Secondary | ICD-10-CM | POA: Diagnosis not present

## 2019-12-23 DIAGNOSIS — C162 Malignant neoplasm of body of stomach: Secondary | ICD-10-CM | POA: Diagnosis not present

## 2019-12-23 DIAGNOSIS — E109 Type 1 diabetes mellitus without complications: Secondary | ICD-10-CM | POA: Diagnosis not present

## 2019-12-23 DIAGNOSIS — Z8546 Personal history of malignant neoplasm of prostate: Secondary | ICD-10-CM | POA: Diagnosis not present

## 2019-12-23 DIAGNOSIS — Z5112 Encounter for antineoplastic immunotherapy: Secondary | ICD-10-CM | POA: Diagnosis not present

## 2019-12-23 LAB — COMPREHENSIVE METABOLIC PANEL
ALT: 73 U/L — ABNORMAL HIGH (ref 0–44)
AST: 81 U/L — ABNORMAL HIGH (ref 15–41)
Albumin: 3.1 g/dL — ABNORMAL LOW (ref 3.5–5.0)
Alkaline Phosphatase: 227 U/L — ABNORMAL HIGH (ref 38–126)
Anion gap: 8 (ref 5–15)
BUN: 14 mg/dL (ref 8–23)
CO2: 26 mmol/L (ref 22–32)
Calcium: 8.6 mg/dL — ABNORMAL LOW (ref 8.9–10.3)
Chloride: 105 mmol/L (ref 98–111)
Creatinine, Ser: 1.04 mg/dL (ref 0.61–1.24)
GFR calc Af Amer: >60 mL/min (ref >60)
GFR calc non Af Amer: >60 mL/min (ref >60)
Glucose, Bld: 113 mg/dL — ABNORMAL HIGH (ref 70–99)
Potassium: 3.7 mmol/L (ref 3.5–5.1)
Sodium: 139 mmol/L (ref 135–145)
Total Bilirubin: 0.7 mg/dL (ref 0.3–1.2)
Total Protein: 6.7 g/dL (ref 6.5–8.1)

## 2019-12-23 LAB — CBC WITH DIFFERENTIAL/PLATELET
Abs Immature Granulocytes: 0.02 10*3/uL (ref 0.00–0.07)
Basophils Absolute: 0 10*3/uL (ref 0.0–0.1)
Basophils Relative: 1 %
Eosinophils Absolute: 0.3 10*3/uL (ref 0.0–0.5)
Eosinophils Relative: 7 %
HCT: 28.3 % — ABNORMAL LOW (ref 39.0–52.0)
Hemoglobin: 8.7 g/dL — ABNORMAL LOW (ref 13.0–17.0)
Immature Granulocytes: 0 %
Lymphocytes Relative: 28 %
Lymphs Abs: 1.3 10*3/uL (ref 0.7–4.0)
MCH: 24 pg — ABNORMAL LOW (ref 26.0–34.0)
MCHC: 30.7 g/dL (ref 30.0–36.0)
MCV: 78.2 fL — ABNORMAL LOW (ref 80.0–100.0)
Monocytes Absolute: 0.6 10*3/uL (ref 0.1–1.0)
Monocytes Relative: 13 %
Neutro Abs: 2.4 10*3/uL (ref 1.7–7.7)
Neutrophils Relative %: 51 %
Platelets: 200 10*3/uL (ref 150–400)
RBC: 3.62 MIL/uL — ABNORMAL LOW (ref 4.22–5.81)
RDW: 19.5 % — ABNORMAL HIGH (ref 11.5–15.5)
WBC: 4.7 10*3/uL (ref 4.0–10.5)
nRBC: 0 % (ref 0.0–0.2)

## 2019-12-23 LAB — CEA (IN HOUSE-CHCC): CEA (CHCC-In House): 63.6 ng/mL — ABNORMAL HIGH (ref 0.00–5.00)

## 2019-12-23 LAB — TSH: TSH: 1.012 u[IU]/mL (ref 0.320–4.118)

## 2019-12-23 MED ORDER — SODIUM CHLORIDE 0.9% FLUSH
10.0000 mL | Freq: Once | INTRAVENOUS | Status: AC
  Administered 2019-12-23: 10 mL
  Filled 2019-12-23: qty 10

## 2019-12-23 MED ORDER — DIPHENHYDRAMINE HCL 25 MG PO CAPS
ORAL_CAPSULE | ORAL | Status: AC
  Filled 2019-12-23: qty 1

## 2019-12-23 MED ORDER — ACETAMINOPHEN 325 MG PO TABS
ORAL_TABLET | ORAL | Status: AC
  Filled 2019-12-23: qty 2

## 2019-12-23 MED ORDER — DIPHENHYDRAMINE HCL 25 MG PO CAPS
25.0000 mg | ORAL_CAPSULE | Freq: Once | ORAL | Status: AC
  Administered 2019-12-23: 25 mg via ORAL

## 2019-12-23 MED ORDER — ACETAMINOPHEN 325 MG PO TABS
650.0000 mg | ORAL_TABLET | Freq: Once | ORAL | Status: AC
  Administered 2019-12-23: 650 mg via ORAL

## 2019-12-23 MED ORDER — HEPARIN SOD (PORK) LOCK FLUSH 100 UNIT/ML IV SOLN
500.0000 [IU] | Freq: Once | INTRAVENOUS | Status: AC | PRN
  Administered 2019-12-23: 500 [IU]
  Filled 2019-12-23: qty 5

## 2019-12-23 MED ORDER — SODIUM CHLORIDE 0.9% FLUSH
10.0000 mL | INTRAVENOUS | Status: AC | PRN
  Administered 2019-12-23: 10 mL
  Filled 2019-12-23: qty 10

## 2019-12-23 MED ORDER — PALONOSETRON HCL INJECTION 0.25 MG/5ML
INTRAVENOUS | Status: AC
  Filled 2019-12-23: qty 5

## 2019-12-23 MED ORDER — SODIUM CHLORIDE 0.9 % IV SOLN
10.0000 mg | Freq: Once | INTRAVENOUS | Status: AC
  Administered 2019-12-23: 10 mg via INTRAVENOUS
  Filled 2019-12-23: qty 10

## 2019-12-23 MED ORDER — FAM-TRASTUZUMAB DERUXTECAN-NXKI CHEMO 100 MG IV SOLR
5.4000 mg/kg | Freq: Once | INTRAVENOUS | Status: AC
  Administered 2019-12-23: 374 mg via INTRAVENOUS
  Filled 2019-12-23: qty 18.7

## 2019-12-23 MED ORDER — PALONOSETRON HCL INJECTION 0.25 MG/5ML
0.2500 mg | Freq: Once | INTRAVENOUS | Status: AC
  Administered 2019-12-23: 0.25 mg via INTRAVENOUS

## 2019-12-23 MED ORDER — DEXTROSE 5 % IV SOLN
Freq: Once | INTRAVENOUS | Status: AC
  Filled 2019-12-23: qty 250

## 2019-12-23 NOTE — Patient Instructions (Signed)

## 2019-12-23 NOTE — Addendum Note (Signed)
Addended by: Georgianne Fick on: 12/23/2019 12:42 PM   Modules accepted: Orders

## 2019-12-23 NOTE — Patient Instructions (Signed)
[facility] Discharge Instructions for Patients Receiving Chemotherapy  Today you received the following chemotherapy agents Fam-trastuzumab deruxtecan-nxki [facility]).  To help prevent nausea and vomiting after your treatment, we encourage you to take your nausea medication as prescribed.   If you develop nausea and vomiting that is not controlled by your nausea medication, call the clinic.   BELOW ARE SYMPTOMS THAT SHOULD BE REPORTED IMMEDIATELY:  *FEVER GREATER THAN 100.5 F  *CHILLS WITH OR WITHOUT FEVER  NAUSEA AND VOMITING THAT IS NOT CONTROLLED WITH YOUR NAUSEA MEDICATION  *UNUSUAL SHORTNESS OF BREATH  *UNUSUAL BRUISING OR BLEEDING  TENDERNESS IN MOUTH AND THROAT WITH OR WITHOUT PRESENCE OF ULCERS  *URINARY PROBLEMS  *BOWEL PROBLEMS  UNUSUAL RASH Items with * indicate a potential emergency and should be followed up as soon as possible.  Feel free to call the clinic should you have any questions or concerns. The clinic phone number is [phone].  Please show the [facility] at check-in to the Emergency Department and triage nurse.  Fam-Trastuzumab deruxtecan injection What is this medicine? TRASTUZUMAB DERUXTECAN (tras TOOZ eu mab DER ux TEE kan) is a monoclonal antibody combined with chemotherapy. It is used to treat breast cancer. This medicine may be used for other purposes; ask your health care provider or pharmacist if you have questions. COMMON BRAND NAME(S): ENHERTU What should I tell my health care provider before I take this medicine? They need to know if you have any of these conditions:  heart disease  heart failure  infection (especially a virus infection such as chickenpox, cold sores, or herpes)  liver disease  lung or breathing disease, like asthma  an unusual or allergic reaction to fam-trastuzumab deruxtecan, other medications, foods, dyes, or preservatives  pregnant or trying to get pregnant  breast-feeding How  should I use this medicine? This medicine is for infusion into a vein. It is given by a health care professional in a hospital or clinic setting. Talk to your pediatrician regarding the use of this medicine in children. Special care may be needed. Overdosage: If you think you have taken too much of this medicine contact a poison control center or emergency room at once. NOTE: This medicine is only for you. Do not share this medicine with others. What if I miss a dose? It is important not to miss your dose. Call your doctor or health care professional if you are unable to keep an appointment. What may interact with this medicine? Interaction studies have not been performed. This list may not describe all possible interactions. Give your health care provider a list of all the medicines, herbs, non-prescription drugs, or dietary supplements you use. Also tell them if you smoke, drink alcohol, or use illegal drugs. Some items may interact with your medicine. What should I watch for while using this medicine? Visit your healthcare professional for regular checks on your progress. Tell your healthcare professional if your symptoms do not start to get better or if they get worse. Your condition will be monitored carefully while you are receiving this medicine. Do not become pregnant while taking this medicine or for 7 months after stopping it. Women should inform their healthcare professional if they wish to become pregnant or think they might be pregnant. Men should not father a child while taking this medicine and for 4 months after stopping it. There is potential for serious side effects to an unborn child. Talk to your healthcare professional for more information. Do not breast-feed  an infant while taking this medicine or for 7 months after the last dose. This medicine has caused decreased sperm counts in some men. This may make it more difficult to father a child. Talk to your healthcare professional if  you are concerned about your fertility. This medicine may increase your risk to bruise or bleed. Call your health care professional if you notice any unusual bleeding. Be careful brushing or flossing your teeth or using a toothpick because you may get an infection or bleed more easily. If you have any dental work done, tell your dentist you are receiving this medicine. This medicine may cause dry eyes [and blurred vision]. If you wear contact lenses, you may feel some discomfort. Lubricating eye drops may help. See your healthcare professional if the problem does not go away or is severe. Call your healthcare professional for advice if you get a fever, chills, or sore throat, or other symptoms of a cold or flu. Do not treat yourself. This medicine decreases your body's ability to fight infections. Try to avoid being around people who are sick. Avoid taking medicines that contain aspirin, acetaminophen, ibuprofen, naproxen, or ketoprofen unless instructed by your healthcare professional. These medicines may hide a fever. What side effects may I notice from receiving this medicine? Side effects that you should report to your doctor or health care professional as soon as possible:  allergic reactions like skin rash, itching or hives, swelling of the face, lips, or tongue  breathing problems  cough  nausea, vomiting  signs and symptoms of bleeding such as bloody or black, tarry stools; red or dark-brown urine; spitting up blood or brown material that looks like coffee grounds; red spots on the skin; unusual bruising or bleeding from the eye, gums, or nose  signs and symptoms of heart failure like breathing problems, fast, irregular heartbeat, sudden weight gain; swelling of the ankles, feet, hands; unusually weak or tired  signs and symptoms of infection like fever; chills; cough; sore throat; pain or trouble passing urine  signs and symptoms of low red blood cells or anemia such as unusually weak  or tired; feeling faint or lightheaded; falls; breathing problems Side effects that usually do not require medical attention (report these to your doctor or health care professional if they continue or are bothersome):  constipation  diarrhea  dry eyes  hair loss  loss of appetite  mouth sores  rash This list may not describe all possible side effects. Call your doctor for medical advice about side effects. You may report side effects to FDA at 1-800-FDA-1088. Where should I keep my medicine? This drug is given in a hospital or clinic and will not be stored at home. NOTE: This sheet is a summary. It may not cover all possible information. If you have questions about this medicine, talk to your doctor, pharmacist, or health care provider.  2020 [employer] Standard (2018-11-05 15:07:27)

## 2019-12-23 NOTE — Progress Notes (Signed)
Verbal order from Dr. Jana Hakim: okay to treat with AST of 81

## 2019-12-24 ENCOUNTER — Telehealth: Payer: Self-pay | Admitting: Emergency Medicine

## 2019-12-24 LAB — CANCER ANTIGEN 19-9: CA 19-9: 103 U/mL — ABNORMAL HIGH (ref 0–35)

## 2019-12-24 NOTE — Telephone Encounter (Signed)
-----   Message from Georgianne Fick, RN sent at 12/23/2019 12:40 PM EDT ----- Regarding: Dr. Virgie Dad First Time ENHERTU Patient received first time Fam-trastuzumab [facility]) today and tolerated this well.

## 2019-12-24 NOTE — Telephone Encounter (Signed)
Chemo f/u call, pt denies any symptoms, verbalized understanding to call CC as needed with any questions or concerns.

## 2019-12-25 ENCOUNTER — Other Ambulatory Visit: Payer: Medicare PPO

## 2019-12-25 NOTE — Progress Notes (Signed)
Peter Wood  [phone] Fax:([phone]     ID: Peter Wood DOB: 05-25-1935  MR#: [id number]  VHQ#:[id number]  Patient Care Team: Peter Anger, MD as PCP - General Peter Banister, MD as Attending Physician (Gastroenterology) Peter Wood, Peter Dad, MD as Consulting Physician (Oncology) Peter Mayer, MD as Consulting Physician (Gastroenterology) Peter Pedro, MD as Consulting Physician (Ophthalmology) Peter Dredge, MD as Referring Physician (Radiation Oncology) Peter Dresser, MD as Consulting Physician (Cardiology) OTHER MD:   CHIEF COMPLAINT: Metastatic gastric adenocarcinoma  CURRENT TREATMENT:Enhertu   INTERVAL HISTORY: Peter Wood returns today for follow-up and treatment of his metastatic gastric adenocarcinoma accompanied by his wife [name].   His cancer is HER-2 positive.  He was most recently treated with TDM 1, with some evidence of progression. He was started on Enhertu on 12/23/2019.  He did well with this, with a little bit of fatigue, very minimal nausea, no vomiting.  He has had no rash or diarrhea problems.  We have stopped the pembrolizumab for insurance reasons.  His most recent echocardiogram on 08/25/2019 showed an ejection fraction of 60-65%.  His next echocardiogram is scheduled for 12/29/2019    Lab Results  Component Value Date   CAN199 103 (H) 12/23/2019   UXL244 65 (H) 12/02/2019   WNU272 37 (H) 11/11/2019   CAN199 48 (H) 10/21/2019   ZDG644 40 (H) 09/30/2019    REVIEW OF SYSTEMS: Peter Wood tells me his appetite is on and off.  His bowel movements currently are regular.  He has no peripheral neuropathy, no shortness of breath or cough, has had no fever rash or bleeding.  He does have hyperpigmentation Peter Wood secondary to his prior treatments.  Overall a detailed review of systems today was stable   HISTORY OF CURRENT ILLNESS: From the original intake note:  Peter Wood presented to the  emergency room 09/07/2018 with mid/upper abdominal pain.  He reported weight loss of 25 pounds over the past 6 months and significant anorexia.  His liver function tests were abnormal and a right upper quadrant ultrasound was obtained.  This was read as concerning for malignancy and a CT of the abdomen and pelvis with contrast was obtained the same day, showing  1. Too numerous to count hypodense masses of the liver concerning for hepatic metastasis. In the region of the porta hepatis, there are hypodense lesions more likely associated with the liver though the pancreatic head is partially obscured by a 3.2 cm hypodense mass. Findings are more likely associated with the liver and less likely an isolated pancreatic mass. 2. Upper abdominal mesenteric and peripancreatic lymphadenopathy suspicious for metastatic disease. 3. 6 mm sclerotic density in the left iliac bone adjacent to the SI joint. Given history of prostate cancer, differential considerations would include osteoblastic metastasis or possibly bone island. Showed only diverticular disease.  He was referred to GI and Peter Wood notes a colonoscopy performed 04/23/2017 makes the possibility of colon cancer unlikely.  Further work-up was planned, but on 09/19/2018 the patient again presented to the emergency room with chest pain, now in a different area, and a staging CT scan of the chest showed filling defects in the right lower lobe pulmonary arteries consistent with embolism.  There was a small pericardial effusion and an additional 2.4 cm left thyroid mass.  Aortic atherosclerosis was noted  He was started on intravenous heparin and on 09/20/2018 underwent upper endoscopy under Dr. Carlean Wood showing a giant nonbleeding cratered gastric ulcer in the  posterior wall of the stomach.  Biopsies of this procedure showed (SZA 20-187) adenocarcinoma.   At that point we were consulted and we obtained tumor markers which included a CEA of 1318.2, a CA 19-9 of  67,479, a normal AFP and a normal PSA.  I discussed the case with pathology and they do not feel that additional testing would be helpful in terms of discriminating between a primary gastric and a primary biliary/pancreatic tumor.  The patient's subsequent history is as detailed below.   PAST MEDICAL HISTORY: Past Medical History:  Diagnosis Date  . Arthritis   . Diverticulosis of colon   . Family history of breast cancer   . Family history of prostate cancer   . HTN (hypertension)   . Hx of colonic polyp   . Pneumonia    as a child/baby  . Poor circulation of extremity    right leg  . Prostate cancer ([place]) 2015   prostate cancer - radiation treated    PAST SURGICAL HISTORY: Past Surgical History:  Procedure Laterality Date  . [place] VITRECTOMY WITH 20 GAUGE MVR PORT FOR MACULAR HOLE Right 09/19/2016   Procedure: 25 GAUGE PARS PLANA VITRECTOMY WITH 20 GAUGE MVR PORT FOR MACULAR HOLE, MEMBRANE PEEL, SERUM PATCH, GAS FLUID EXCHANGE, HEADSCOPE LASER;  Surgeon: Peter Pedro, MD;  Location: [facility];  Service: Ophthalmology;  Laterality: Right;  . BIOPSY  09/20/2018   Procedure: BIOPSY;  Surgeon: Peter Mayer, MD;  Location: [facility] ENDOSCOPY;  Service: Endoscopy;;  . CATARACT EXTRACTION  08/27/12   Right  . COLONOSCOPY    . ESOPHAGOGASTRODUODENOSCOPY (EGD) WITH PROPOFOL N/A 09/20/2018   Procedure: ESOPHAGOGASTRODUODENOSCOPY (EGD) WITH PROPOFOL;  Surgeon: Peter Mayer, MD;  Location: [place];  Service: Endoscopy;  Laterality: N/A;  . HAND SURGERY  2016   right thumb  . IR IMAGING GUIDED PORT INSERTION  11/04/2018  . KNEE SURGERY Left 1989    FAMILY HISTORY: Family History  Problem Relation Age of Onset  . Prostate cancer Father        dx >50  . Hypertension Mother   . Ulcers Mother   . Breast cancer Sister        dx 20's  . Prostate cancer Brother        dx under 65, metasttic, cause of his death  . Ulcers Brother   . Ulcers Sister    Peter Wood father  died from heart complications with a pacemaker at age 8; Peter Wood father also has prostate cancer. Patients' mother died from natural causes at age 56. The patient has 4 brothers and 3 sisters. One of Peter Wood sisters had breast cancer in her 4s and a brother had prostate cancer. Patient denies anyone in her family having ovarian, or pancreatic cancer. Peter Wood mentions that his youngest sister and his brother with prostate cancer also had stomach ulcers.    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 His wife, [name], is a retired [employer] summer Radiographer, therapeutic. As of 09/2018, they have been married for 56 years. They have 4 children, Peter Wood, Peter Wood, Peter Wood, and Peter Wood. Peter Wood lives in [place] and works at the [employer]. Peter Wood lives in [place] and works at [employer] and as a part Horticulturist, commercial. 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 Peter Wood lives in [place] and works at [employer]. Peter Wood has 7 grandchildren and 1 step great grandchild. He attends [employer].  ADVANCED DIRECTIVES: His wife, [name], is automatically his healthcare power of attorney.     HEALTH MAINTENANCE: Social History   Tobacco Use  . Smoking status: Former Smoker    Years: 4.00    Quit date: 09/12/1967    Years since quitting: 52.3  . Smokeless tobacco: Never Used  Substance Use Topics  . Alcohol use: No  . Drug use: No    Colonoscopy: 2018/[facility]  PSA: 0.54 on 09/18/2018  No Known Allergies  Current Outpatient Medications  Medication Sig Dispense Refill  . amLODipine (NORVASC) 5 MG tablet Take 1 tablet (5 mg total) by mouth daily. 90 tablet 3  . apixaban (ELIQUIS) 2.5 MG TABS tablet Take 1 tablet (2.5 mg total) by mouth 2 (two) times daily. 60 tablet 11  . benazepril (LOTENSIN) 40 MG tablet Take 1 tablet (40 mg total) by mouth daily. 90 tablet 3  . Cholecalciferol 1000 UNITS tablet Take 1,000 Units by mouth daily.     Marland Kitchen dexamethasone  (DECADRON) 4 MG tablet Take 2 tablets (8 mg total) by mouth daily. Start the day after chemotherapy for 2 days. 30 tablet 1  . Elderberry 575 MG/5ML SYRP Take by mouth.    . latanoprost (XALATAN) 0.005 % ophthalmic solution Place 1 drop into both eyes at bedtime.     . lidocaine-prilocaine (EMLA) cream Apply to affected area once 30 g 3  . ondansetron (ZOFRAN) 4 MG tablet Take 1 tablet (4 mg total) by mouth every 8 (eight) hours as needed for nausea or vomiting. 20 tablet 1  . prochlorperazine (COMPAZINE) 10 MG tablet Take 1 tablet (10 mg total) by mouth every 6 (six) hours as needed (Nausea or vomiting). 30 tablet 1   No current facility-administered medications for this visit.   Facility-Administered Medications Ordered in Other Visits  Medication Dose Route Frequency Provider Last Rate Last Admin  . sodium chloride flush (NS) 0.9 % injection 10 mL  10 mL Intravenous PRN Miller Limehouse, Peter Dad, MD      . sodium chloride flush (NS) 0.9 % injection 10 mL  10 mL Intracatheter PRN Kallan Merrick, Peter Dad, MD   10 mL at 12/23/19 1238    OBJECTIVE:  African-American man who appears stated age  1:   12/26/19 1055  BP: (!) 143/70  Pulse: 61  Resp: 18  Temp: 98.5 F (36.9 C)  SpO2: 100%   Wt Readings from Last 3 Encounters:  12/26/19 152 lb (68.9 kg)  12/23/19 154 lb 4 oz (70 kg)  12/16/19 152 lb 11.2 oz (69.3 kg)   Body mass index is 25.69 kg/m.    ECOG FS:1 - Symptomatic but completely ambulatory  Sclerae unicteric, EOMs intact Wearing a mask No cervical or supraclavicular adenopathy Lungs no rales or rhonchi Heart regular rate and rhythm Abd soft, nontender, positive bowel sounds MSK no focal spinal tenderness; acral hyperpigmentation as previously noted Neuro: nonfocal, well oriented, positive affect     LAB RESULTS:  CMP     Component Value Date/Time   NA 136 12/26/2019 1040   K 3.6 12/26/2019 1040   CL 102 12/26/2019 1040   CO2 26 12/26/2019 1040   GLUCOSE 119 (H)  12/26/2019 1040   GLUCOSE 105 (H) 09/13/2006 0900   BUN 18 12/26/2019 1040   CREATININE 1.09 12/26/2019 1040   CREATININE 1.02 10/16/2018 0936   CALCIUM 8.7 (L) 12/26/2019 1040   PROT 6.6 12/26/2019 1040   ALBUMIN 3.2 (L) 12/26/2019 1040   AST 72 (H) 12/26/2019 1040  AST 76 (H) 10/16/2018 0936   ALT 66 (H) 12/26/2019 1040   ALT 47 (H) 10/16/2018 0936   ALKPHOS 249 (H) 12/26/2019 1040   BILITOT 0.9 12/26/2019 1040   BILITOT 0.7 10/16/2018 0936   GFRNONAA >60 12/26/2019 1040   GFRNONAA >60 10/16/2018 0936   GFRAA >60 12/26/2019 1040   GFRAA >60 10/16/2018 0936    No results found for: TOTALPROTELP, ALBUMINELP, A1GS, A2GS, BETS, BETA2SER, GAMS, MSPIKE, SPEI  No results found for: KPAFRELGTCHN, LAMBDASER, KAPLAMBRATIO  Lab Results  Component Value Date   WBC 4.1 12/26/2019   NEUTROABS 2.5 12/26/2019   HGB 8.6 (L) 12/26/2019   HCT 28.6 (L) 12/26/2019   MCV 76.7 (L) 12/26/2019   PLT 195 12/26/2019    No results found for: LABCA2  No components found for: MWNUUV253  No results for input(s): INR in the last 168 hours.  No results found for: LABCA2  Lab Results  Component Value Date   CAN199 103 (H) 12/23/2019    No results found for: GUY403  No results found for: KVQ259  No results found for: CA2729  No components found for: HGQUANT  Lab Results  Component Value Date   CEA1 63.60 (H) 12/23/2019   /  CEA ([facility])  Date Value Ref Range Status  12/23/2019 63.60 (H) 0.00 - 5.00 ng/mL Final    Comment:    (NOTE) This test was performed using Architect's Chemiluminescent Microparticle Immunoassay. Values obtained from different assay methods cannot be used interchangeably. Please note that 5-10% of patients who smoke may see CEA levels up to 6.9 ng/mL. Performed at [facility], [place] [address], [place], [place] [address]      No results found for: AFPTUMOR  No results found for: CHROMOGRNA  No results found for: HGBA,  HGBA2QUANT, HGBFQUANT, HGBSQUAN (Hemoglobinopathy evaluation)   No results found for: LDH  Lab Results  Component Value Date   IRON 38 (L) 09/21/2018   TIBC 251 09/21/2018   IRONPCTSAT 15 (L) 09/21/2018   (Iron and TIBC)  Lab Results  Component Value Date   FERRITIN 138 09/21/2018    Urinalysis    Component Value Date/Time   COLORURINE YELLOW 02/18/2019 1056   APPEARANCEUR CLEAR 02/18/2019 1056   LABSPEC 1.018 02/18/2019 1056   PHURINE 6.0 02/18/2019 1056   GLUCOSEU NEGATIVE 02/18/2019 1056   GLUCOSEU NEGATIVE 07/20/2017 1056   HGBUR NEGATIVE 02/18/2019 1056   [place] 02/18/2019 1056   KETONESUR NEGATIVE 02/18/2019 1056   PROTEINUR 30 (A) 02/18/2019 1056   UROBILINOGEN 0.2 07/20/2017 1056   NITRITE NEGATIVE 02/18/2019 [place] 02/18/2019 1056    STUDIES:  CT Abdomen Pelvis W Contrast  Result Date: 12/11/2019 CLINICAL DATA:  History of gastric cancer. Follow-up metastatic hepatic disease. Ongoing chemotherapy and immunotherapy. EXAM: CT ABDOMEN AND PELVIS WITH CONTRAST TECHNIQUE: Multidetector CT imaging of the abdomen and pelvis was performed using the standard protocol following bolus administration of intravenous contrast. CONTRAST:  115m OMNIPAQUE IOHEXOL 300 MG/ML  SOLN COMPARISON:  CT scan 09/08/2019 FINDINGS: Lower chest: Stable dense parenchymal scarring changes at both lung bases. No discrete nodules. No pleural effusion. Hepatobiliary: Segment 8 liver lesion measures 5.1 x 4.8 cm on image 12/2. This previously measured approximately 4.2 x 3.7 cm. Segment 7 lesion measures approximately 2.5 cm in the transverse dimension and previously measured 3.0 cm. Segment 4A lesion on image 15/2 measures 3.3 x 3.0 cm and previously measured 2.4 x 2.1 cm. Segment 4B lesion  may be 2 adjacent lesions but measures a total of 2.9 x 1.9 cm and previously measured 3.1 x 2.9 cm. Segment 6 lesion measures a maximum of 16 mm and previously measured 18 mm. No  new lesions are identified. The gallbladder appears normal. No common bile duct dilatation. Pancreas: No mass, inflammation or ductal dilatation. Spleen: Normal size. No focal lesions. Adrenals/Urinary Tract: The adrenal glands and kidneys are unremarkable and stable. Small renal cysts are noted. The bladder appears normal. Stomach/Bowel: Mild to moderate wall thickening involving the or antral region of the stomach may reflect the patient's gastric tumor. Adjacent adenopathy measures 2.0 x 1.8 cm on image 29/2. This appears grossly stable. The small bowel and colon are unremarkable. Vascular/Lymphatic: Stable tortuosity, ectasia and calcification of the abdominal aorta. 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 No retroperitoneal or pelvic adenopathy. Reproductive: Mild prostate gland enlargement. Brachytherapy seeds are noted. Other: No pelvic mass or pelvic adenopathy. No free pelvic fluid collections. No inguinal mass or adenopathy. Musculoskeletal: No acute bony findings or worrisome bone lesions. IMPRESSION: 1. Mixed findings in the liver with some lesions being larger and some lesions smaller when compared to prior study. No new lesions are identified. 2. Stable gastric wall thickening and adjacent adenopathy. 3. Stable advanced atherosclerotic calcifications involving the aorta and iliac arteries. 4. Stable dense parenchymal scarring changes at the lung bases. Aortic Atherosclerosis (ICD10-I70.0). Electronically Signed   By: Marijo Sanes M.D.   On: 12/11/2019 11:20     ELIGIBLE FOR AVAILABLE RESEARCH PROTOCOL: no   ASSESSMENT: 85 y.o. [place], [place] man status post gastric biopsy 09/20/2018 showing adenocarcinoma, with a CA 19-9 greater than 65,000; staging studies showing an apparent mass adjacent to the head of the pancreas, innumerable liver lesions, upper abdominal mesenteric and peripancreatic lymphadenopathy, and an isolated sclerotic density in the left iliac bone  (a)  genomics requested on gastric biopsy showed the tumor to be HER-2 amplified at 3+, PD-L1 positive, and T p53 mutated.  MSI is stable, mismatch repair status is proficient and the tumor mutational burden is intermediate.  (1) chest CT scan 09/19/2018 shows right lower lobe pulmonary embolism  (a) on intravenous heparin started 09/19/2018, transitioned to apixaban 10/01/2018  (2) genetics testing 10/04/2018: negative  (a) family history of prostate and early breast cancer; patient has a history of prostate cancer  (3) started gemcitabine/Abraxane 10/09/2018, repeated days 1, 8 and 15 of each 28-day cycle  (a) stopped after 1 cycle (3 doses) with reassessment of diagnosis based on CARIS results, noted above  (4) started oxaliplatin, capecitabine and trastuzumab 11/06/2018  (a) baseline echocardiogram 09/20/2018 shows an ejection fraction in the 55-60% range  (b) oxaliplatin and trastuzumab every 21 days started 11/06/2018  (c) capecitabine dose 1000 mg twice daily for 14 days of every 21-day cycle  (d) CT of the abdomen and pelvis 02/11/2019 shows marked improvement in his liver lesions and resolution of periportal adenopathy  (e) tumor markers also show significant response,  (f) echocardiogram 02/12/2019 shows an ejection fraction in the 60-65% range  (g) CT abd/pelvis show stable/ improved disease, tumor markers nearly normal  (h) oxaliplatin stopped aftwer 04/30/2019 dose  (i) capecitabine stopped after September cycle   (5) trastuzumab maintenance started 05/20/2019, last dose 07/22/2019, with progression  (6) disease progression documented by lab work and CT scan 07/16/2019  (a) resumed FOLFOX 07/29/2019, continuing trastuzumab (every 14 days).  (b) restaging studies after 3 cycles showed evidence of progression (tumor markers equivocal)  (  c) FOLFOX discontinued 08/26/2019  (7) started T-DM1 09/30/2019, repeat every 21 days  (a) pembrolizumab added 12/02/2019  (b) echocardiogram  08/25/2019 showed an ejection fraction in the 60-65% range  (c) repeat CT of the abdomen 12/11/2019 shows growth in 2 liver lesions  (8) started [facility] 12/23/2019 at 5.4 mg/kg.  (9) iron deficiency anemia:  (a) Feraheme scheduled for 04/22 /2021 and 01/08/2020   PLAN: Peter Wood appears to be tolerating his first dose of Enhertu well.  His counts today are holding up except for the hemoglobin.  With a drop in the MCV I am sure that he is having some occult bleeding from his GI tumor.  I am setting him up for 2 doses of Feraheme beginning next week.  I will also give [place] an additional set of counts to see whether he is going to need granulocyte support with this new treatment.  Otherwise the plan is to do Enhertu x4 and then restage.  We will be following his tumor markers as before.  He knows to call for any other issue that may develop before the next visit.  Total encounter time 25 minutes.Sarajane Jews C. Doylene Splinter, MD 12/28/19 1:55 PM Medical Oncology and Hematology [facility] [place], [place] [address] Tel. [phone]    Fax. [phone]   I, Wilburn Mylar, am acting as scribe for Dr. Virgie Wood. Suriya Kovarik.  I, Lurline Del MD, have reviewed the above documentation for accuracy and completeness, and I agree with the above.   *Total Encounter Time as defined by the Centers for Medicare and Medicaid Services includes, in addition to the face-to-face time of a patient visit (documented in the note above) non-face-to-face time: obtaining and reviewing outside history, ordering and reviewing medications, tests or procedures, care coordination (communications with other health care professionals or caregivers) and documentation in the medical record.

## 2019-12-26 ENCOUNTER — Inpatient Hospital Stay: Payer: Medicare PPO

## 2019-12-26 ENCOUNTER — Inpatient Hospital Stay (HOSPITAL_BASED_OUTPATIENT_CLINIC_OR_DEPARTMENT_OTHER): Payer: Medicare PPO | Admitting: Oncology

## 2019-12-26 ENCOUNTER — Other Ambulatory Visit: Payer: Self-pay

## 2019-12-26 VITALS — BP 143/70 | HR 61 | Temp 98.5°F | Resp 18 | Ht 64.5 in | Wt 152.0 lb

## 2019-12-26 DIAGNOSIS — D500 Iron deficiency anemia secondary to blood loss (chronic): Secondary | ICD-10-CM

## 2019-12-26 DIAGNOSIS — C61 Malignant neoplasm of prostate: Secondary | ICD-10-CM

## 2019-12-26 DIAGNOSIS — C169 Malignant neoplasm of stomach, unspecified: Secondary | ICD-10-CM

## 2019-12-26 DIAGNOSIS — D649 Anemia, unspecified: Secondary | ICD-10-CM

## 2019-12-26 DIAGNOSIS — C162 Malignant neoplasm of body of stomach: Secondary | ICD-10-CM | POA: Diagnosis not present

## 2019-12-26 DIAGNOSIS — C799 Secondary malignant neoplasm of unspecified site: Secondary | ICD-10-CM | POA: Diagnosis not present

## 2019-12-26 DIAGNOSIS — Z79899 Other long term (current) drug therapy: Secondary | ICD-10-CM | POA: Diagnosis not present

## 2019-12-26 DIAGNOSIS — Z8546 Personal history of malignant neoplasm of prostate: Secondary | ICD-10-CM | POA: Diagnosis not present

## 2019-12-26 DIAGNOSIS — Z803 Family history of malignant neoplasm of breast: Secondary | ICD-10-CM | POA: Diagnosis not present

## 2019-12-26 DIAGNOSIS — Z95828 Presence of other vascular implants and grafts: Secondary | ICD-10-CM

## 2019-12-26 DIAGNOSIS — C787 Secondary malignant neoplasm of liver and intrahepatic bile duct: Secondary | ICD-10-CM | POA: Diagnosis not present

## 2019-12-26 DIAGNOSIS — D509 Iron deficiency anemia, unspecified: Secondary | ICD-10-CM | POA: Diagnosis not present

## 2019-12-26 DIAGNOSIS — Z794 Long term (current) use of insulin: Secondary | ICD-10-CM | POA: Diagnosis not present

## 2019-12-26 DIAGNOSIS — Z5112 Encounter for antineoplastic immunotherapy: Secondary | ICD-10-CM | POA: Diagnosis not present

## 2019-12-26 DIAGNOSIS — E109 Type 1 diabetes mellitus without complications: Secondary | ICD-10-CM | POA: Diagnosis not present

## 2019-12-26 LAB — CBC WITH DIFFERENTIAL/PLATELET
Abs Immature Granulocytes: 0.01 10*3/uL (ref 0.00–0.07)
Basophils Absolute: 0 10*3/uL (ref 0.0–0.1)
Basophils Relative: 1 %
Eosinophils Absolute: 0.2 10*3/uL (ref 0.0–0.5)
Eosinophils Relative: 5 %
HCT: 28.6 % — ABNORMAL LOW (ref 39.0–52.0)
Hemoglobin: 8.6 g/dL — ABNORMAL LOW (ref 13.0–17.0)
Immature Granulocytes: 0 %
Lymphocytes Relative: 25 %
Lymphs Abs: 1 10*3/uL (ref 0.7–4.0)
MCH: 23.1 pg — ABNORMAL LOW (ref 26.0–34.0)
MCHC: 30.1 g/dL (ref 30.0–36.0)
MCV: 76.7 fL — ABNORMAL LOW (ref 80.0–100.0)
Monocytes Absolute: 0.3 10*3/uL (ref 0.1–1.0)
Monocytes Relative: 8 %
Neutro Abs: 2.5 10*3/uL (ref 1.7–7.7)
Neutrophils Relative %: 61 %
Platelets: 195 10*3/uL (ref 150–400)
RBC: 3.73 MIL/uL — ABNORMAL LOW (ref 4.22–5.81)
RDW: 19.5 % — ABNORMAL HIGH (ref 11.5–15.5)
WBC: 4.1 10*3/uL (ref 4.0–10.5)
nRBC: 0 % (ref 0.0–0.2)

## 2019-12-26 LAB — COMPREHENSIVE METABOLIC PANEL
ALT: 66 U/L — ABNORMAL HIGH (ref 0–44)
AST: 72 U/L — ABNORMAL HIGH (ref 15–41)
Albumin: 3.2 g/dL — ABNORMAL LOW (ref 3.5–5.0)
Alkaline Phosphatase: 249 U/L — ABNORMAL HIGH (ref 38–126)
Anion gap: 8 (ref 5–15)
BUN: 18 mg/dL (ref 8–23)
CO2: 26 mmol/L (ref 22–32)
Calcium: 8.7 mg/dL — ABNORMAL LOW (ref 8.9–10.3)
Chloride: 102 mmol/L (ref 98–111)
Creatinine, Ser: 1.09 mg/dL (ref 0.61–1.24)
GFR calc Af Amer: >60 mL/min (ref >60)
GFR calc non Af Amer: >60 mL/min (ref >60)
Glucose, Bld: 119 mg/dL — ABNORMAL HIGH (ref 70–99)
Potassium: 3.6 mmol/L (ref 3.5–5.1)
Sodium: 136 mmol/L (ref 135–145)
Total Bilirubin: 0.9 mg/dL (ref 0.3–1.2)
Total Protein: 6.6 g/dL (ref 6.5–8.1)

## 2019-12-26 MED ORDER — HEPARIN SOD (PORK) LOCK FLUSH 100 UNIT/ML IV SOLN
500.0000 [IU] | Freq: Once | INTRAVENOUS | Status: AC
  Administered 2019-12-26: 11:00:00 500 [IU]
  Filled 2019-12-26: qty 5

## 2019-12-26 MED ORDER — SODIUM CHLORIDE 0.9% FLUSH
10.0000 mL | Freq: Once | INTRAVENOUS | Status: AC
  Administered 2019-12-26: 11:00:00 10 mL
  Filled 2019-12-26: qty 10

## 2019-12-26 NOTE — Patient Instructions (Signed)

## 2019-12-29 ENCOUNTER — Other Ambulatory Visit: Payer: Self-pay

## 2019-12-29 ENCOUNTER — Telehealth: Payer: Self-pay | Admitting: Oncology

## 2019-12-29 ENCOUNTER — Ambulatory Visit (HOSPITAL_COMMUNITY)
Admission: RE | Admit: 2019-12-29 | Discharge: 2019-12-29 | Disposition: A | Discharge status: Home / Self Care | Payer: Medicare PPO | Source: Ambulatory Visit | Attending: Internal Medicine | Admitting: Internal Medicine

## 2019-12-29 ENCOUNTER — Ambulatory Visit (HOSPITAL_BASED_OUTPATIENT_CLINIC_OR_DEPARTMENT_OTHER)
Admission: RE | Admit: 2019-12-29 | Discharge: 2019-12-29 | Disposition: A | Discharge status: Home / Self Care | Payer: Medicare PPO | Source: Ambulatory Visit | Attending: Internal Medicine | Admitting: Internal Medicine

## 2019-12-29 ENCOUNTER — Encounter (HOSPITAL_COMMUNITY): Payer: Self-pay | Admitting: Internal Medicine

## 2019-12-29 VITALS — BP 150/82 | HR 61 | Wt 148.8 lb

## 2019-12-29 DIAGNOSIS — Z8546 Personal history of malignant neoplasm of prostate: Secondary | ICD-10-CM | POA: Diagnosis not present

## 2019-12-29 DIAGNOSIS — Z79899 Other long term (current) drug therapy: Secondary | ICD-10-CM | POA: Diagnosis not present

## 2019-12-29 DIAGNOSIS — Z923 Personal history of irradiation: Secondary | ICD-10-CM | POA: Diagnosis not present

## 2019-12-29 DIAGNOSIS — I509 Heart failure, unspecified: Secondary | ICD-10-CM | POA: Diagnosis not present

## 2019-12-29 DIAGNOSIS — Z87891 Personal history of nicotine dependence: Secondary | ICD-10-CM | POA: Insufficient documentation

## 2019-12-29 DIAGNOSIS — Z8249 Family history of ischemic heart disease and other diseases of the circulatory system: Secondary | ICD-10-CM | POA: Insufficient documentation

## 2019-12-29 DIAGNOSIS — Z7952 Long term (current) use of systemic steroids: Secondary | ICD-10-CM | POA: Insufficient documentation

## 2019-12-29 DIAGNOSIS — C169 Malignant neoplasm of stomach, unspecified: Secondary | ICD-10-CM

## 2019-12-29 DIAGNOSIS — I110 Hypertensive heart disease with heart failure: Secondary | ICD-10-CM | POA: Insufficient documentation

## 2019-12-29 DIAGNOSIS — Z7901 Long term (current) use of anticoagulants: Secondary | ICD-10-CM | POA: Diagnosis not present

## 2019-12-29 DIAGNOSIS — Z01818 Encounter for other preprocedural examination: Secondary | ICD-10-CM | POA: Diagnosis not present

## 2019-12-29 DIAGNOSIS — I10 Essential (primary) hypertension: Secondary | ICD-10-CM

## 2019-12-29 DIAGNOSIS — C799 Secondary malignant neoplasm of unspecified site: Secondary | ICD-10-CM | POA: Insufficient documentation

## 2019-12-29 NOTE — Telephone Encounter (Signed)
Scheduled appts per 4/16 los. Pt confirmed appt dates and times.

## 2019-12-29 NOTE — Progress Notes (Signed)
CARDIO-ONCOLOGY CLINIC NOTE  Referring Physician: Dr. Jana Wood   HPI:  Peter Wood is 85 y.o. male with metastatic gastric adenoCA referred by Dr. Doris Wood for enrollment into the Cardio-Oncology program for cardiac surveillance while receiving Enhertu immunotherapy.   Has h/o HTN and prostate CA. Denies any h/o known heart disease. Active with no CP or SOB.   Diagnosed with gastric adenoCA in 1/20.   Treatment course:  (1) started gemcitabine/Abraxane 10/09/2018, repeated days 1, 8 and 15 of each 28-day cycle             (a) stopped after 1 cycle (3 doses) with reassessment of diagnosis based on CARIS results, noted above  (2) started oxaliplatin, capecitabine and trastuzumab 11/06/2018             (a) baseline echocardiogram 09/20/2018 shows an ejection fraction in the 55-60% range             (b) oxaliplatin and trastuzumab every 21 days started 11/06/2018             (c) capecitabine dose 1000 mg twice daily for 14 days of every 21-day cycle             (d) CT of the abdomen and pelvis 02/11/2019 shows marked improvement in his liver lesions and resolution of periportal adenopathy             (e) tumor markers also show significant response,             (f) echocardiogram 02/12/2019 shows an ejection fraction in the 60-65% range             (g) CT abd/pelvis show stable/ improved disease, tumor markers nearly normal             (h) oxaliplatin stopped aftwer 04/30/2019 dose             (i) capecitabine stopped after September cycle              (3) trastuzumab maintenance started 05/20/2019   Restaging studies in 12/20 showed marked progression of disease and changed to Enhertu. Repeat restaging 12/11/19. Got first done [facility] 12/23/19. Tolerated well. Remains fairly active. No CP or SOB. No edema. BP usually well controlled at home.          ECHO: 08/25/19 EF 60-65%  Echo today EF 55-60%  GLS - 19.5% Personally reviewed    Past Medical History:  Diagnosis Date  .  Arthritis   . Diverticulosis of colon   . Family history of breast cancer   . Family history of prostate cancer   . HTN (hypertension)   . Hx of colonic polyp   . Pneumonia    as a child/baby  . Poor circulation of extremity    right leg  . Prostate cancer [facility]) 2015   prostate cancer - radiation treated    Current Outpatient Medications  Medication Sig Dispense Refill  . amLODipine (NORVASC) 5 MG tablet Take 1 tablet (5 mg total) by mouth daily. 90 tablet 3  . apixaban (ELIQUIS) 2.5 MG TABS tablet Take 1 tablet (2.5 mg total) by mouth 2 (two) times daily. 60 tablet 11  . benazepril (LOTENSIN) 40 MG tablet Take 1 tablet (40 mg total) by mouth daily. 90 tablet 3  . Cholecalciferol 1000 UNITS tablet Take 1,000 Units by mouth daily.     Marland Kitchen dexamethasone (DECADRON) 4 MG tablet Take 2 tablets (8 mg total) by mouth daily. Start the day after chemotherapy for  2 days. 30 tablet 1  . Elderberry 575 MG/5ML SYRP Take by mouth.    . latanoprost (XALATAN) 0.005 % ophthalmic solution Place 1 drop into both eyes at bedtime.     . lidocaine-prilocaine (EMLA) cream Apply to affected area once 30 g 3  . ondansetron (ZOFRAN) 4 MG tablet Take 1 tablet (4 mg total) by mouth every 8 (eight) hours as needed for nausea or vomiting. 20 tablet 1   No current facility-administered medications for this encounter.   Facility-Administered Medications Ordered in Other Encounters  Medication Dose Route Frequency Provider Last Rate Last Admin  . sodium chloride flush (NS) 0.9 % injection 10 mL  10 mL Intravenous PRN Wood, Peter Dad, MD      . sodium chloride flush (NS) 0.9 % injection 10 mL  10 mL Intracatheter PRN Wood, Peter Dad, MD   10 mL at 12/23/19 1238    No Known Allergies    Social History   Socioeconomic History  . Marital status: Married    Spouse name: Not on file  . Number of children: 4  . Years of education: Not on file  . Highest education level: Not on file  Occupational History  .  Occupation: retired  Tobacco Use  . Smoking status: Former Smoker    Years: 4.00    Quit date: 09/12/1967    Years since quitting: 52.3  . Smokeless tobacco: Never Used  Substance and Sexual Activity  . Alcohol use: No  . Drug use: No  . Sexual activity: Not on file  Other Topics Concern  . Not on file  Social History Narrative  . Not on file   Social Determinants of Health   Financial Resource Strain: Low Risk   . Difficulty of Paying Living Expenses: Not hard at all  Food Insecurity: No Food Insecurity  . Worried About Charity fundraiser in the Last Year: Never true  . Ran Out of Food in the Last Year: Never true  Transportation Needs: No Transportation Needs  . Lack of Transportation (Medical): No  . Lack of Transportation (Non-Medical): No  Physical Activity: Insufficiently Active  . Days of Exercise per Week: 5 days  . Minutes of Exercise per Session: 10 min  Stress: No Stress Concern Present  . Feeling of Stress : Not at all  Social Connections:   . Frequency of Communication with Friends and Family:   . Frequency of Social Gatherings with Friends and Family:   . Attends Religious Services:   . Active Member of Clubs or Organizations:   . Attends Archivist Meetings:   Marland Kitchen Marital Status:   Intimate Partner Violence:   . Fear of Current or Ex-Partner:   . Emotionally Abused:   Marland Kitchen Physically Abused:   . Sexually Abused:       Family History  Problem Relation Age of Onset  . Prostate cancer Father        dx >50  . Hypertension Mother   . Ulcers Mother   . Breast cancer Sister        dx 20's  . Prostate cancer Brother        dx under 42, metasttic, cause of his death  . Ulcers Brother   . Ulcers Sister     Vitals:   12/29/19 0901  BP: (!) 150/82  Pulse: 61  SpO2: 100%  Weight: 67.5 kg (148 lb 12.8 oz)    PHYSICAL EXAM: General:  Well appearing. No resp difficulty  HEENT: normal Neck: supple. no JVD. Carotids 2+ bilat; no bruits. No  lymphadenopathy or thryomegaly appreciated. Cor: PMI nondisplaced. Regular rate & rhythm. No rubs, gallops or murmurs. Lungs: clear Abdomen: soft, nontender, nondistended. No hepatosplenomegaly. No bruits or masses. Good bowel sounds. Extremities: no cyanosis, clubbing, rash, edema Neuro: alert & orientedx3, cranial nerves grossly intact. moves all 4 extremities w/o difficulty. Affect pleasant    ASSESSMENT & PLAN:  1. Metastatic gastric adenoCA - has tolerated Herceptin well with out any cardiac toxicity. Echo 12/20 reviewed and EF 60% - no s/p 1st dose Enhertu 12/23/19 - Echo today (12/29/19) EF 55-60%  GLS - 19.5% (stable) Personally reviewed - Repeat echo in 3 months. Continue Enhertu  2. HTN - BP high here but says he did not take meds this am say BP usually well controlled - In reviewing vitals from [place] typical BP 130-140s/80s. Borderline control. - If SBP persistently above 140 would increase amlodipine to 10 or add low-dose spiro 12.5  Glori Bickers, MD  9:23 AM

## 2019-12-29 NOTE — Patient Instructions (Signed)
Your physician recommends that you schedule a follow-up appointment in: 3 months with echocardiogram  If you have any questions or concerns before your next appointment please send [place] a message through [place] or call our office at [phone].  At the [facility], you and your health needs are our priority. As part of our continuing mission to provide you with exceptional heart care, we have created designated Provider Care Teams. These Care Teams include your primary Cardiologist (physician) and Advanced Practice Providers (APPs- Physician Assistants and Nurse Practitioners) who all work together to provide you with the care you need, when you need it.   You may see any of the following providers on your designated Care Team at your next follow up: Marland Kitchen Dr Glori Bickers . Dr Loralie Champagne . Darrick Grinder, NP . Lyda Jester, PA . Audry Riles, PharmD   Please be sure to bring in all your medications bottles to every appointment.

## 2019-12-29 NOTE — Progress Notes (Signed)
  Echocardiogram 2D Echocardiogram has been performed.  Bobbye Charleston 12/29/2019, 8:56 AM

## 2019-12-31 IMAGING — CT CT ABD-PELV W/ CM
2 of 5 series · 15 of 46 positions shown, 17 images · IV contrast (APPLIED)
Comparison: 07/16/2019 and 09/07/2018.

CLINICAL DATA: Gastrointestinal cancer, staging. Prostate cancer
metastatic to stomach and liver. Ongoing chemotherapy.

EXAM:
CT ABDOMEN AND PELVIS WITH CONTRAST
TECHNIQUE: Multidetector CT imaging of the abdomen and pelvis was performed
using the standard protocol following bolus administration of
intravenous contrast.
CONTRAST:  100mL OMNIPAQUE IOHEXOL 300 MG/ML  SOLN

[Series 2: axial st · axial · 0.71mm/px · z∈[-424,-114]mm · 12 of 72 slices shown, 14 images]
[im 5/72  soft-tissue]
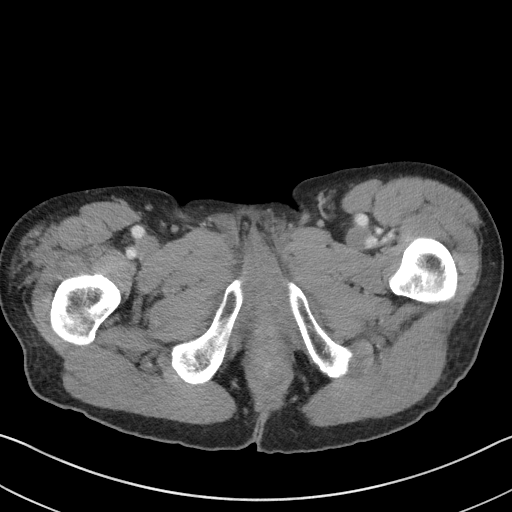
[im 5/72  bone]
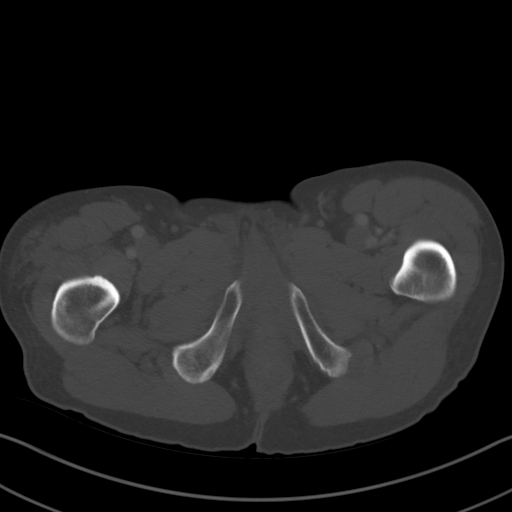
[im 9/72  soft-tissue]
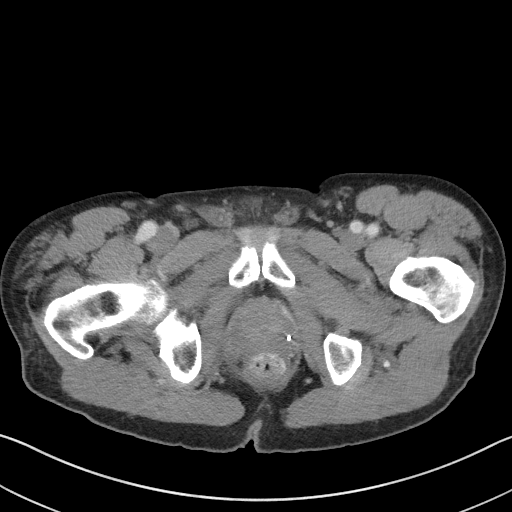
[im 18/72  soft-tissue]
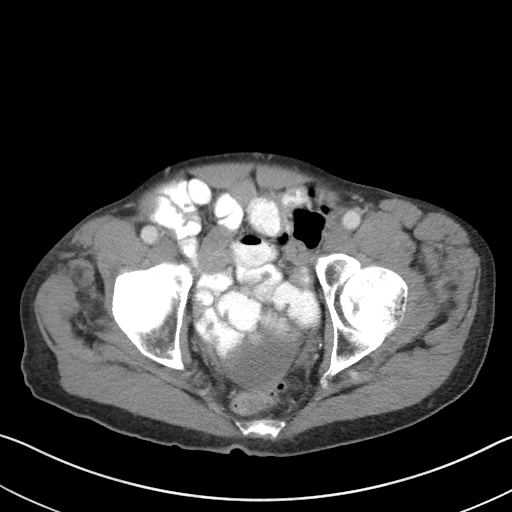
[im 23/72  soft-tissue]
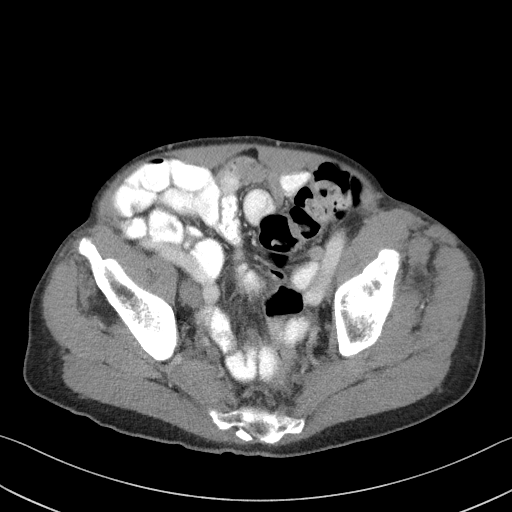
[im 27/72  soft-tissue]
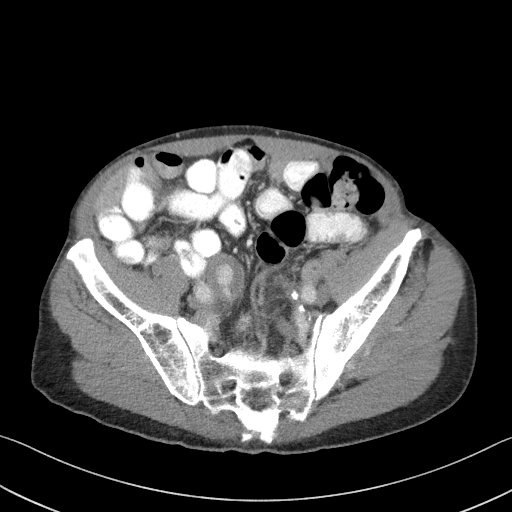
[im 32/72  soft-tissue]
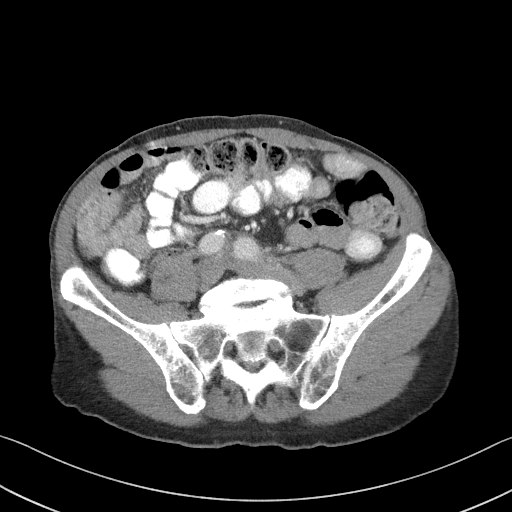
[im 40/72  soft-tissue]
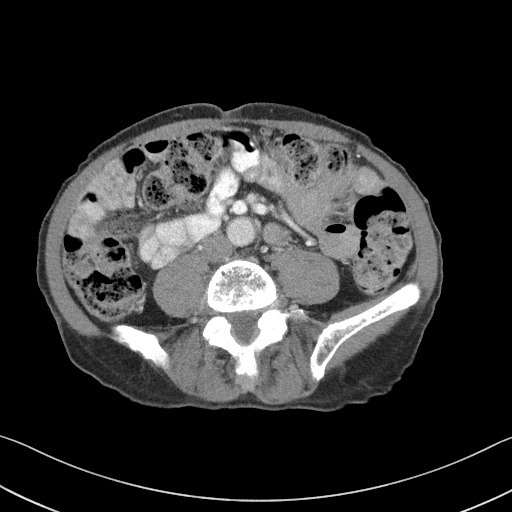
[im 45/72  soft-tissue]
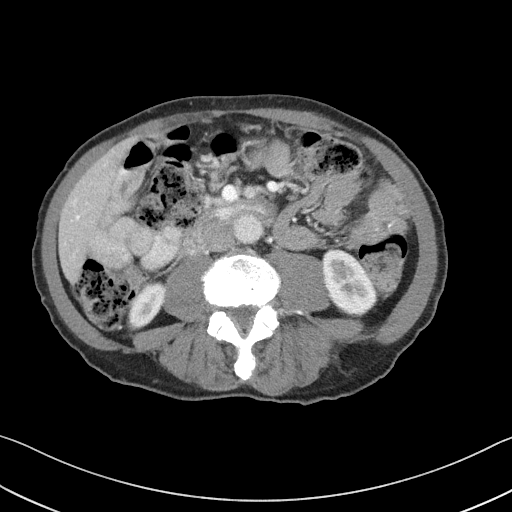
[im 49/72  soft-tissue]
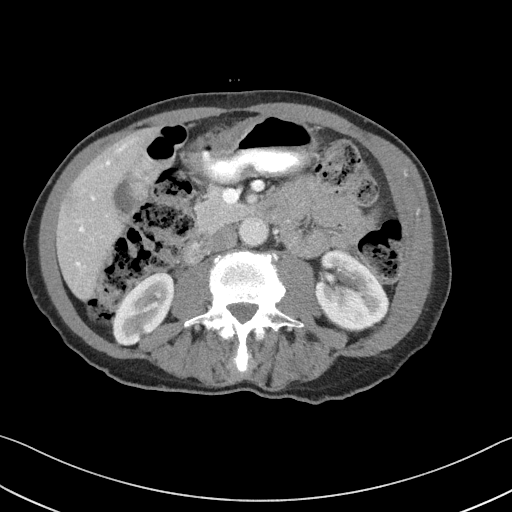
[im 49/72  bone]
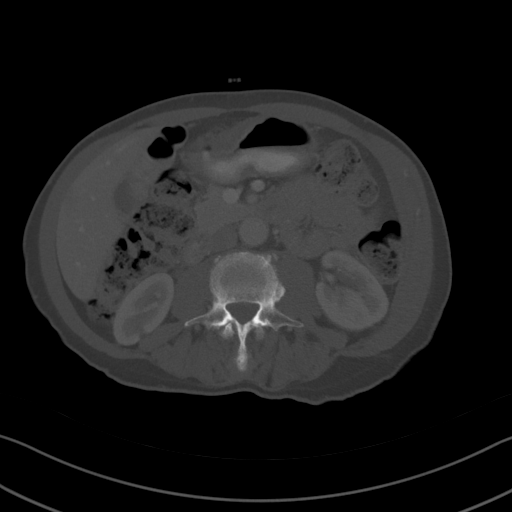
[im 54/72  soft-tissue]
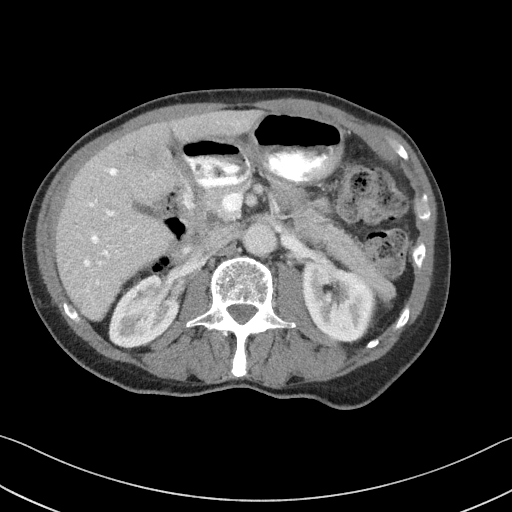
[im 63/72  soft-tissue]
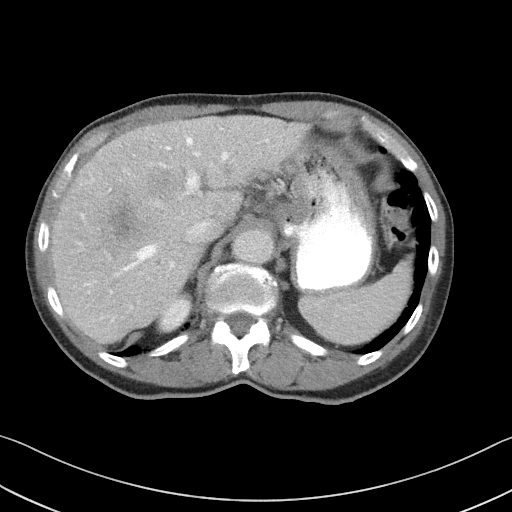
[im 67/72  soft-tissue]
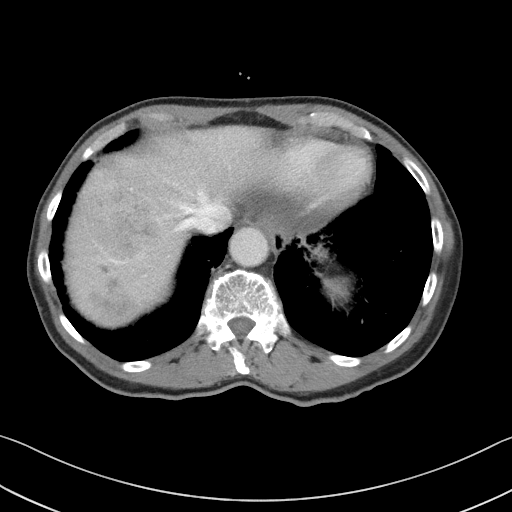

[Series 5: coronal st · coronal · 0.60mm/px · 3 of 85 slices shown]
[im 29/85  soft-tissue]
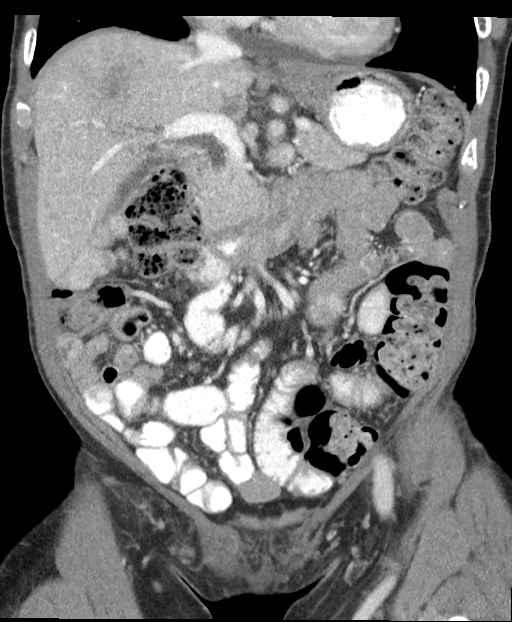
[im 38/85  soft-tissue]
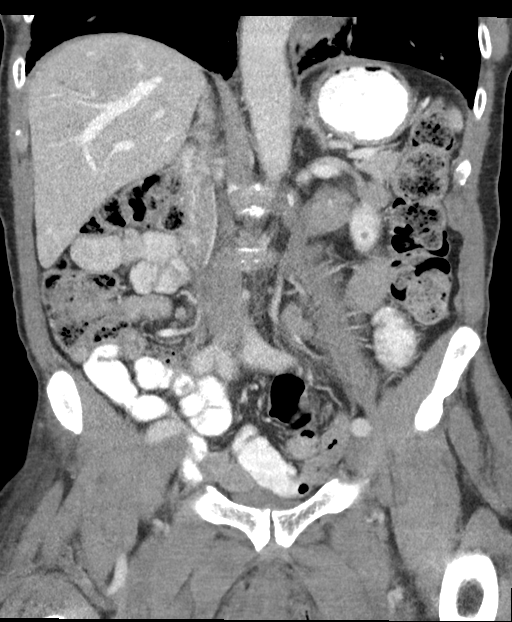
[im 47/85  soft-tissue]
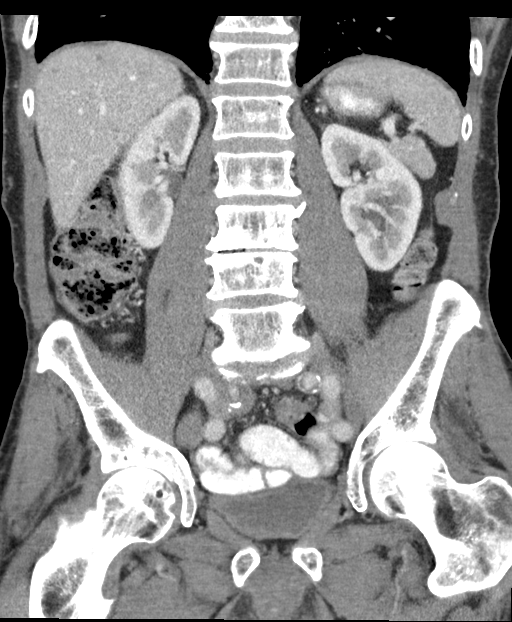

[15 of 46 positions shown; findings below may reference images not displayed]

FINDINGS: Lower chest: Scarring in the lung bases. Nodular density in the
subpleural medial right lower lobe measures 4 mm ([DATE]), unchanged
from 09/07/2018. Heart is at the upper limits of normal in size.
Small pericardial effusion, stable. No pleural fluid.

Hepatobiliary: Heterogeneous nodules and masses in the liver are
somewhat difficult to measure. Index mass in the right hepatic lobe
measures 3.8 x 4.2 cm ([DATE]), increased from 3.2 x 4.2 cm on
07/16/2019 and is increasingly necrotic centrally. Index lesion in
the left hepatic lobe (segment 4B) has enlarged as well, now
measuring 3.0 x 3.2 cm ([DATE]), compared to 1.5 x 2.1 cm on
07/16/2019. This lesion also appears more centrally necrotic.
Gallbladder is unremarkable. No biliary ductal dilatation.

Pancreas: Negative.

Spleen: Negative.

Adrenals/Urinary Tract: Adrenal glands are unremarkable.
Subcentimeter low-attenuation lesions in the kidneys are too small
to characterize but statistically, cysts are likely. Ureters are
decompressed. Bladder is grossly unremarkable.

Stomach/Bowel: There may be wall thickening at the gastroesophageal
junction as well as within the mid and distal stomach. Suspect a
discrete mass in the gastric antrum, measuring 1.4 x 2.6 cm (coronal
image 13), similar. Small bowel and appendix are otherwise
unremarkable. Stool is seen in the majority of the colon, indicative
of constipation.

Vascular/Lymphatic: Atherosclerotic calcification of the aorta.
Largely thrombosed aneurysm of the right internal iliac artery,
measuring 2.7 cm (2/47). Adenopathy inferior to the gastric antrum
measures approximately 1.5 cm ([DATE]), similar. Otherwise, no
pathologically enlarged lymph nodes.

Reproductive: Prostate is enlarged and contains brachytherapy seeds.

Other: No free fluid. Mesenteries and peritoneum are otherwise
unremarkable.

Musculoskeletal: No worrisome lytic or sclerotic lesions.
Degenerative disc disease and disc space narrowing at L3-4 and
L5-S1.
IMPRESSION: 1. Enlarging and increasingly necrotic hepatic metastases.
2. Probable distal gastric mass and adjacent adenopathy, grossly
stable.
3. Small pericardial effusion, stable.
4. Enlarged prostate.
5. Aortic atherosclerosis (887QM-170.0). Largely thrombosed right
internal iliac artery aneurysm.

## 2020-01-01 ENCOUNTER — Other Ambulatory Visit: Payer: Self-pay

## 2020-01-01 ENCOUNTER — Inpatient Hospital Stay: Payer: Medicare PPO

## 2020-01-01 VITALS — BP 109/71 | HR 60 | Temp 98.1°F | Resp 18

## 2020-01-01 DIAGNOSIS — C169 Malignant neoplasm of stomach, unspecified: Secondary | ICD-10-CM

## 2020-01-01 DIAGNOSIS — D509 Iron deficiency anemia, unspecified: Secondary | ICD-10-CM | POA: Diagnosis not present

## 2020-01-01 DIAGNOSIS — C787 Secondary malignant neoplasm of liver and intrahepatic bile duct: Secondary | ICD-10-CM | POA: Diagnosis not present

## 2020-01-01 DIAGNOSIS — C162 Malignant neoplasm of body of stomach: Secondary | ICD-10-CM | POA: Diagnosis not present

## 2020-01-01 DIAGNOSIS — Z803 Family history of malignant neoplasm of breast: Secondary | ICD-10-CM | POA: Diagnosis not present

## 2020-01-01 DIAGNOSIS — C799 Secondary malignant neoplasm of unspecified site: Secondary | ICD-10-CM

## 2020-01-01 DIAGNOSIS — D649 Anemia, unspecified: Secondary | ICD-10-CM

## 2020-01-01 DIAGNOSIS — Z794 Long term (current) use of insulin: Secondary | ICD-10-CM | POA: Diagnosis not present

## 2020-01-01 DIAGNOSIS — E109 Type 1 diabetes mellitus without complications: Secondary | ICD-10-CM | POA: Diagnosis not present

## 2020-01-01 DIAGNOSIS — Z8546 Personal history of malignant neoplasm of prostate: Secondary | ICD-10-CM | POA: Diagnosis not present

## 2020-01-01 DIAGNOSIS — Z79899 Other long term (current) drug therapy: Secondary | ICD-10-CM | POA: Diagnosis not present

## 2020-01-01 DIAGNOSIS — Z95828 Presence of other vascular implants and grafts: Secondary | ICD-10-CM

## 2020-01-01 DIAGNOSIS — Z5112 Encounter for antineoplastic immunotherapy: Secondary | ICD-10-CM | POA: Diagnosis not present

## 2020-01-01 LAB — CBC WITH DIFFERENTIAL/PLATELET
Abs Immature Granulocytes: 0.01 10*3/uL (ref 0.00–0.07)
Basophils Absolute: 0 10*3/uL (ref 0.0–0.1)
Basophils Relative: 1 %
Eosinophils Absolute: 0.3 10*3/uL (ref 0.0–0.5)
Eosinophils Relative: 6 %
HCT: 28 % — ABNORMAL LOW (ref 39.0–52.0)
Hemoglobin: 8.8 g/dL — ABNORMAL LOW (ref 13.0–17.0)
Immature Granulocytes: 0 %
Lymphocytes Relative: 27 %
Lymphs Abs: 1.2 10*3/uL (ref 0.7–4.0)
MCH: 24 pg — ABNORMAL LOW (ref 26.0–34.0)
MCHC: 31.4 g/dL (ref 30.0–36.0)
MCV: 76.5 fL — ABNORMAL LOW (ref 80.0–100.0)
Monocytes Absolute: 0.5 10*3/uL (ref 0.1–1.0)
Monocytes Relative: 10 %
Neutro Abs: 2.6 10*3/uL (ref 1.7–7.7)
Neutrophils Relative %: 56 %
Platelets: 205 10*3/uL (ref 150–400)
RBC: 3.66 MIL/uL — ABNORMAL LOW (ref 4.22–5.81)
RDW: 20.6 % — ABNORMAL HIGH (ref 11.5–15.5)
WBC: 4.6 10*3/uL (ref 4.0–10.5)
nRBC: 0.7 % — ABNORMAL HIGH (ref 0.0–0.2)

## 2020-01-01 LAB — COMPREHENSIVE METABOLIC PANEL
ALT: 49 U/L — ABNORMAL HIGH (ref 0–44)
AST: 66 U/L — ABNORMAL HIGH (ref 15–41)
Albumin: 3.2 g/dL — ABNORMAL LOW (ref 3.5–5.0)
Alkaline Phosphatase: 291 U/L — ABNORMAL HIGH (ref 38–126)
Anion gap: 8 (ref 5–15)
BUN: 12 mg/dL (ref 8–23)
CO2: 26 mmol/L (ref 22–32)
Calcium: 8.7 mg/dL — ABNORMAL LOW (ref 8.9–10.3)
Chloride: 104 mmol/L (ref 98–111)
Creatinine, Ser: 1.15 mg/dL (ref 0.61–1.24)
GFR calc Af Amer: >60 mL/min (ref >60)
GFR calc non Af Amer: 59 mL/min — ABNORMAL LOW (ref >60)
Glucose, Bld: 104 mg/dL — ABNORMAL HIGH (ref 70–99)
Potassium: 3.7 mmol/L (ref 3.5–5.1)
Sodium: 138 mmol/L (ref 135–145)
Total Bilirubin: 0.7 mg/dL (ref 0.3–1.2)
Total Protein: 6.8 g/dL (ref 6.5–8.1)

## 2020-01-01 MED ORDER — SODIUM CHLORIDE 0.9 % IV SOLN
Freq: Once | INTRAVENOUS | Status: AC
  Filled 2020-01-01: qty 250

## 2020-01-01 MED ORDER — HEPARIN SOD (PORK) LOCK FLUSH 100 UNIT/ML IV SOLN
500.0000 [IU] | Freq: Once | INTRAVENOUS | Status: AC
  Administered 2020-01-01: 10:00:00 500 [IU]
  Filled 2020-01-01: qty 5

## 2020-01-01 MED ORDER — SODIUM CHLORIDE 0.9% FLUSH
10.0000 mL | Freq: Once | INTRAVENOUS | Status: AC
  Administered 2020-01-01: 10 mL
  Filled 2020-01-01: qty 10

## 2020-01-01 MED ORDER — SODIUM CHLORIDE 0.9 % IV SOLN
510.0000 mg | Freq: Once | INTRAVENOUS | Status: AC
  Administered 2020-01-01: 510 mg via INTRAVENOUS
  Filled 2020-01-01: qty 510

## 2020-01-01 NOTE — Patient Instructions (Signed)
Ferumoxytol (Feraheme) injection What is this medicine? FERUMOXYTOL is an iron complex. Iron is used to make healthy red blood cells, which carry oxygen and nutrients throughout the body. This medicine is used to treat iron deficiency anemia. This medicine may be used for other purposes; ask your health care provider or pharmacist if you have questions. COMMON BRAND NAME(S): Feraheme What should I tell my health care provider before I take this medicine? They need to know if you have any of these conditions:  anemia not caused by low iron levels  high levels of iron in the blood  magnetic resonance imaging (MRI) test scheduled  an unusual or allergic reaction to iron, other medicines, foods, dyes, or preservatives  pregnant or trying to get pregnant  breast-feeding How should I use this medicine? This medicine is for injection into a vein. It is given by a health care professional in a hospital or clinic setting. Talk to your pediatrician regarding the use of this medicine in children. Special care may be needed. Overdosage: If you think you have taken too much of this medicine contact a poison control center or emergency room at once. NOTE: This medicine is only for you. Do not share this medicine with others. What if I miss a dose? It is important not to miss your dose. Call your doctor or health care professional if you are unable to keep an appointment. What may interact with this medicine? This medicine may interact with the following medications:  other iron products This list may not describe all possible interactions. Give your health care provider a list of all the medicines, herbs, non-prescription drugs, or dietary supplements you use. Also tell them if you smoke, drink alcohol, or use illegal drugs. Some items may interact with your medicine. What should I watch for while using this medicine? Visit your doctor or healthcare professional regularly. Tell your doctor or  healthcare professional if your symptoms do not start to get better or if they get worse. You may need blood work done while you are taking this medicine. You may need to follow a special diet. Talk to your doctor. Foods that contain iron include: whole grains/cereals, dried fruits, beans, or peas, leafy green vegetables, and organ meats (liver, kidney). What side effects may I notice from receiving this medicine? Side effects that you should report to your doctor or health care professional as soon as possible:  allergic reactions like skin rash, itching or hives, swelling of the face, lips, or tongue  breathing problems  changes in blood pressure  feeling faint or lightheaded, falls  fever or chills  flushing, sweating, or hot feelings  swelling of the ankles or feet Side effects that usually do not require medical attention (report to your doctor or health care professional if they continue or are bothersome):  diarrhea  headache  nausea, vomiting  stomach pain This list may not describe all possible side effects. Call your doctor for medical advice about side effects. You may report side effects to FDA at 1-800-FDA-1088. Where should I keep my medicine? This drug is given in a hospital or clinic and will not be stored at home. NOTE: This sheet is a summary. It may not cover all possible information. If you have questions about this medicine, talk to your doctor, pharmacist, or health care provider.  2020 Elsevier/Gold Standard (2016-10-16 20:21:10)

## 2020-01-07 NOTE — Progress Notes (Signed)
Pharmacist Chemotherapy Monitoring - Follow Up Assessment    I verify that I have reviewed each item in the below checklist:  . Regimen for the patient is scheduled for the appropriate day and plan matches scheduled date. Marland Kitchen Appropriate non-routine labs are ordered dependent on drug ordered. . If applicable, additional medications reviewed and ordered per protocol based on lifetime cumulative doses and/or treatment regimen.   Plan for follow-up and/or issues identified: No . I-vent associated with next due treatment: No . MD and/or nursing notified: No  Peter Wood D 01/07/2020 12:57 PM

## 2020-01-08 ENCOUNTER — Other Ambulatory Visit: Payer: Self-pay

## 2020-01-08 ENCOUNTER — Inpatient Hospital Stay: Payer: Medicare PPO

## 2020-01-08 VITALS — BP 130/77 | HR 58 | Temp 98.7°F | Resp 18

## 2020-01-08 DIAGNOSIS — E109 Type 1 diabetes mellitus without complications: Secondary | ICD-10-CM | POA: Diagnosis not present

## 2020-01-08 DIAGNOSIS — Z5112 Encounter for antineoplastic immunotherapy: Secondary | ICD-10-CM | POA: Diagnosis not present

## 2020-01-08 DIAGNOSIS — Z95828 Presence of other vascular implants and grafts: Secondary | ICD-10-CM

## 2020-01-08 DIAGNOSIS — Z8546 Personal history of malignant neoplasm of prostate: Secondary | ICD-10-CM | POA: Diagnosis not present

## 2020-01-08 DIAGNOSIS — Z794 Long term (current) use of insulin: Secondary | ICD-10-CM | POA: Diagnosis not present

## 2020-01-08 DIAGNOSIS — C787 Secondary malignant neoplasm of liver and intrahepatic bile duct: Secondary | ICD-10-CM | POA: Diagnosis not present

## 2020-01-08 DIAGNOSIS — Z79899 Other long term (current) drug therapy: Secondary | ICD-10-CM | POA: Diagnosis not present

## 2020-01-08 DIAGNOSIS — C169 Malignant neoplasm of stomach, unspecified: Secondary | ICD-10-CM

## 2020-01-08 DIAGNOSIS — C162 Malignant neoplasm of body of stomach: Secondary | ICD-10-CM | POA: Diagnosis not present

## 2020-01-08 DIAGNOSIS — Z803 Family history of malignant neoplasm of breast: Secondary | ICD-10-CM | POA: Diagnosis not present

## 2020-01-08 DIAGNOSIS — D509 Iron deficiency anemia, unspecified: Secondary | ICD-10-CM | POA: Diagnosis not present

## 2020-01-08 MED ORDER — SODIUM CHLORIDE 0.9% FLUSH
10.0000 mL | Freq: Once | INTRAVENOUS | Status: AC
  Administered 2020-01-08: 10 mL
  Filled 2020-01-08: qty 10

## 2020-01-08 MED ORDER — SODIUM CHLORIDE 0.9 % IV SOLN
Freq: Once | INTRAVENOUS | Status: AC
  Filled 2020-01-08: qty 250

## 2020-01-08 MED ORDER — HEPARIN SOD (PORK) LOCK FLUSH 100 UNIT/ML IV SOLN
500.0000 [IU] | Freq: Once | INTRAVENOUS | Status: AC
  Administered 2020-01-08: 500 [IU]
  Filled 2020-01-08: qty 5

## 2020-01-08 MED ORDER — SODIUM CHLORIDE 0.9 % IV SOLN
510.0000 mg | Freq: Once | INTRAVENOUS | Status: AC
  Administered 2020-01-08: 510 mg via INTRAVENOUS
  Filled 2020-01-08: qty 510

## 2020-01-08 NOTE — Patient Instructions (Signed)
Ferumoxytol (Feraheme) injection What is this medicine? FERUMOXYTOL is an iron complex. Iron is used to make healthy red blood cells, which carry oxygen and nutrients throughout the body. This medicine is used to treat iron deficiency anemia. This medicine may be used for other purposes; ask your health care provider or pharmacist if you have questions. COMMON BRAND NAME(S): Feraheme What should I tell my health care provider before I take this medicine? They need to know if you have any of these conditions:  anemia not caused by low iron levels  high levels of iron in the blood  magnetic resonance imaging (MRI) test scheduled  an unusual or allergic reaction to iron, other medicines, foods, dyes, or preservatives  pregnant or trying to get pregnant  breast-feeding How should I use this medicine? This medicine is for injection into a vein. It is given by a health care professional in a hospital or clinic setting. Talk to your pediatrician regarding the use of this medicine in children. Special care may be needed. Overdosage: If you think you have taken too much of this medicine contact a poison control center or emergency room at once. NOTE: This medicine is only for you. Do not share this medicine with others. What if I miss a dose? It is important not to miss your dose. Call your doctor or health care professional if you are unable to keep an appointment. What may interact with this medicine? This medicine may interact with the following medications:  other iron products This list may not describe all possible interactions. Give your health care provider a list of all the medicines, herbs, non-prescription drugs, or dietary supplements you use. Also tell them if you smoke, drink alcohol, or use illegal drugs. Some items may interact with your medicine. What should I watch for while using this medicine? Visit your doctor or healthcare professional regularly. Tell your doctor or  healthcare professional if your symptoms do not start to get better or if they get worse. You may need blood work done while you are taking this medicine. You may need to follow a special diet. Talk to your doctor. Foods that contain iron include: whole grains/cereals, dried fruits, beans, or peas, leafy green vegetables, and organ meats (liver, kidney). What side effects may I notice from receiving this medicine? Side effects that you should report to your doctor or health care professional as soon as possible:  allergic reactions like skin rash, itching or hives, swelling of the face, lips, or tongue  breathing problems  changes in blood pressure  feeling faint or lightheaded, falls  fever or chills  flushing, sweating, or hot feelings  swelling of the ankles or feet Side effects that usually do not require medical attention (report to your doctor or health care professional if they continue or are bothersome):  diarrhea  headache  nausea, vomiting  stomach pain This list may not describe all possible side effects. Call your doctor for medical advice about side effects. You may report side effects to FDA at 1-800-FDA-1088. Where should I keep my medicine? This drug is given in a hospital or clinic and will not be stored at home. NOTE: This sheet is a summary. It may not cover all possible information. If you have questions about this medicine, talk to your doctor, pharmacist, or health care provider.  2020 Elsevier/Gold Standard (2016-10-16 20:21:10)

## 2020-01-12 NOTE — Progress Notes (Signed)
[place]  [phone] [phone]     ID: Peter Wood DOB: [birth year]  MR#: [id number]  EXB#:[id number]  Patient Care Team: Cassandria Anger, MD as PCP - General Milus Banister, MD as Attending Physician (Gastroenterology) Magrinat, Virgie Dad, MD as Consulting Physician (Oncology) Gatha Mayer, MD as Consulting Physician (Gastroenterology) [name], MD as Consulting Physician (Ophthalmology) Ramon Dredge, MD as Referring Physician (Radiation Oncology) Larey Dresser, MD as Consulting Physician (Cardiology) OTHER MD:   CHIEF COMPLAINT: Metastatic gastric adenocarcinoma  CURRENT TREATMENT:Enhertu  INTERVAL HISTORY: Peter Wood returns today for follow-up and treatment of his metastatic gastric adenocarcinoma accompanied by his wife Peter Wood.   His cancer is HER-2 positive.  He was most recently treated with TDM 1, with some evidence of progression. He was started on Enhertu on 12/23/2019. He receives this every three weeks and is tolerating this well.    He also received IV iron for his anemia related to chronic blood loss due to likely his gi malignancy.  He received this on 4/22 and again on 4/29 he says that his fatigue is much improved and he tolerated these infusions well, without any difficulty.    His most recent echocardiogram on 12/29/2019 and showed an LVEF of 55-60%    REVIEW OF SYSTEMS: Peter Wood continues to do well.  He enjoys doing yard work, and is no longer able to do this with his cancer diagnosis.  He misses this.  He remains active, and denies any new issues today.  He is eating well and his appetite is stable.  He says it was decreased slightly, however it has recovered.    Peter Wood denies any fever, chills, chest pain, palpitations, cough, shortness of breath, bowel/bladder changes, headaches, nausea, vomiting, mucositis, or any other concerns.  A detailed ROS was otherwise non contributory.     HISTORY  OF CURRENT ILLNESS: From the original intake note:  Peter Wood presented to the emergency room 09/07/2018 with mid/upper abdominal pain.  He reported weight loss of 25 pounds over the past 6 months and significant anorexia.  His liver function tests were abnormal and a right upper quadrant ultrasound was obtained.  This was read as concerning for malignancy and a CT of the abdomen and pelvis with contrast was obtained the same day, showing  1. Too numerous to count hypodense masses of the liver concerning for hepatic metastasis. In the region of the porta hepatis, there are hypodense lesions more likely associated with the liver though the pancreatic head is partially obscured by a 3.2 cm hypodense mass. Findings are more likely associated with the liver and less likely an isolated pancreatic mass. 2. Upper abdominal mesenteric and peripancreatic lymphadenopathy suspicious for metastatic disease. 3. 6 mm sclerotic density in the left iliac bone adjacent to the SI joint. Given history of prostate cancer, differential considerations would include osteoblastic metastasis or possibly bone island. Showed only diverticular disease.  He was referred to GI and Dr. Ardis Hughs notes a colonoscopy performed 04/23/2017 makes the possibility of colon cancer unlikely.  Further work-up was planned, but on 09/19/2018 the patient again presented to the emergency room with chest pain, now in a different area, and a staging CT scan of the chest showed filling defects in the right lower lobe pulmonary arteries consistent with embolism.  There was a small pericardial effusion and an additional 2.4 cm left thyroid mass.  Aortic atherosclerosis was noted  He was started on intravenous heparin and on  09/20/2018 underwent upper endoscopy under Dr. Carlean Purl showing a giant nonbleeding cratered gastric ulcer in the posterior wall of the stomach.  Biopsies of this procedure showed (SZA 20-187) adenocarcinoma.   At that point we  were consulted and we obtained tumor markers which included a CEA of 1318.2, a CA 19-9 of 67,479, a normal AFP and a normal PSA.  I discussed the case with pathology and they do not feel that additional testing would be helpful in terms of discriminating between a primary gastric and a primary biliary/pancreatic tumor.  The patient's subsequent history is as detailed below.   PAST MEDICAL HISTORY: Past Medical History:  Diagnosis Date  . Arthritis   . Diverticulosis of colon   . Family history of breast cancer   . Family history of prostate cancer   . HTN (hypertension)   . Hx of colonic polyp   . Pneumonia    as a child/baby  . Poor circulation of extremity    right leg  . Prostate cancer ([place]) 2015   prostate cancer - radiation treated    PAST SURGICAL HISTORY: Past Surgical History:  Procedure Laterality Date  . [place] VITRECTOMY WITH 20 GAUGE MVR PORT FOR MACULAR HOLE Right 09/19/2016   Procedure: 25 GAUGE PARS PLANA VITRECTOMY WITH 20 GAUGE MVR PORT FOR MACULAR HOLE, MEMBRANE PEEL, SERUM PATCH, GAS FLUID EXCHANGE, HEADSCOPE LASER;  Surgeon: [name], MD;  Location: [facility];  Service: Ophthalmology;  Laterality: Right;  . BIOPSY  09/20/2018   Procedure: BIOPSY;  Surgeon: Gatha Mayer, MD;  Location: [facility] ENDOSCOPY;  Service: Endoscopy;;  . CATARACT EXTRACTION  08/27/12   Right  . COLONOSCOPY    . ESOPHAGOGASTRODUODENOSCOPY (EGD) WITH PROPOFOL N/A 09/20/2018   Procedure: ESOPHAGOGASTRODUODENOSCOPY (EGD) WITH PROPOFOL;  Surgeon: Gatha Mayer, MD;  Location: [facility];  Service: Endoscopy;  Laterality: N/A;  . HAND SURGERY  2016   right thumb  . IR IMAGING GUIDED PORT INSERTION  11/04/2018  . KNEE SURGERY Left 1989    FAMILY HISTORY: Family History  Problem Relation Age of Onset  . Prostate cancer Father        dx >50  . Hypertension Mother   . Ulcers Mother   . Breast cancer Sister        dx 20's  . Prostate cancer Brother        dx under 40,  metasttic, cause of his death  . Ulcers Brother   . Ulcers Sister    Gardner's father died from heart complications with a pacemaker at age 54; Sanjith's father also has prostate cancer. Patients' mother died from natural causes at age 34. The patient has 4 brothers and 3 sisters. One of [place] sisters had breast cancer in her 78s and a brother had prostate cancer. Patient denies anyone in her family having ovarian, or pancreatic cancer. Chevelle mentions that his youngest sister and his brother with prostate cancer also had stomach ulcers.    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 His wife, Peter Wood, is a retired [employer] summer Radiographer, therapeutic. As of 09/2018, they have been married for 56 years. They have 4 children, Peter Wood, Peter Wood, Peter Wood, and Peter Wood. Peter Wood lives in [place] and works at the [employer]. Sherrod lives in [place] and works at [employer] and as a part Horticulturist, commercial. Germane lives in [place] and is a Engineer, drilling for a medical company. Beverely Low lives in [place] and works at [employer]. Tyrel has  7 grandchildren and 1 step great grandchild. He attends [employer].    ADVANCED DIRECTIVES: His wife, Peter Wood, is automatically his healthcare power of attorney.     HEALTH MAINTENANCE: Social History   Tobacco Use  . Smoking status: Former Smoker    Years: 4.00    Quit date: 09/12/1967    Years since quitting: 52.3  . Smokeless tobacco: Never Used  Substance Use Topics  . Alcohol use: No  . Drug use: No    Colonoscopy: 2018/[facility]  PSA: 0.54 on 09/18/2018  No Known Allergies  Current Outpatient Medications  Medication Sig Dispense Refill  . amLODipine (NORVASC) 5 MG tablet Take 1 tablet (5 mg total) by mouth daily. 90 tablet 3  . apixaban (ELIQUIS) 2.5 MG TABS tablet Take 1 tablet (2.5 mg total) by mouth 2 (two) times daily. 60 tablet 11  . benazepril (LOTENSIN) 40 MG tablet Take 1 tablet (40 mg total) by mouth daily. 90 tablet 3    . Cholecalciferol 1000 UNITS tablet Take 1,000 Units by mouth daily.     Marland Kitchen dexamethasone (DECADRON) 4 MG tablet Take 2 tablets (8 mg total) by mouth daily. Start the day after chemotherapy for 2 days. 30 tablet 1  . Elderberry 575 MG/5ML SYRP Take by mouth.    . latanoprost (XALATAN) 0.005 % ophthalmic solution Place 1 drop into both eyes at bedtime.     . lidocaine-prilocaine (EMLA) cream Apply to affected area once 30 g 3  . ondansetron (ZOFRAN) 4 MG tablet Take 1 tablet (4 mg total) by mouth every 8 (eight) hours as needed for nausea or vomiting. 20 tablet 1   No current facility-administered medications for this visit.   Facility-Administered Medications Ordered in Other Visits  Medication Dose Route Frequency Provider Last Rate Last Admin  . sodium chloride flush (NS) 0.9 % injection 10 mL  10 mL Intravenous PRN Magrinat, Virgie Dad, MD      . sodium chloride flush (NS) 0.9 % injection 10 mL  10 mL Intracatheter PRN Magrinat, Virgie Dad, MD   10 mL at 12/23/19 1238    OBJECTIVE:  African-American man who appears stated age  57:   01/13/20 1139  BP: (!) 144/79  Pulse: 70  Resp: 18  Temp: 99.1 F (37.3 C)  SpO2: 100%   Wt Readings from Last 3 Encounters:  01/13/20 146 lb 12.8 oz (66.6 kg)  12/29/19 148 lb 12.8 oz (67.5 kg)  12/26/19 152 lb (68.9 kg)   Body mass index is 24.81 kg/m.    ECOG FS:1 - Symptomatic but completely ambulatory  GENERAL: Patient is a well appearing male in no acute distress HEENT:  Sclerae anicteric. Mask in place. Neck is supple.  NODES:  No cervical, supraclavicular, or axillary lymphadenopathy palpated.  LUNGS:  Clear to auscultation bilaterally.  No wheezes or rhonchi. HEART:  Regular rate and rhythm. No murmur appreciated. ABDOMEN:  Soft, nontender.  Positive, normoactive bowel sounds. No organomegaly palpated. MSK:  No focal spinal tenderness to palpation.  EXTREMITIES:  No peripheral edema.   SKIN:  Clear with no obvious rashes or skin  changes. No nail dyscrasia. NEURO:  Nonfocal. Well oriented.  Appropriate affect.       LAB RESULTS:  CMP     Component Value Date/Time   NA 141 01/13/2020 1128   K 4.0 01/13/2020 1128   CL 106 01/13/2020 1128   CO2 28 01/13/2020 1128   GLUCOSE 86 01/13/2020 1128   GLUCOSE 105 (H)  09/13/2006 0900   BUN 13 01/13/2020 1128   CREATININE 1.11 01/13/2020 1128   CREATININE 1.02 10/16/2018 0936   CALCIUM 8.9 01/13/2020 1128   PROT 6.8 01/13/2020 1128   ALBUMIN 3.3 (L) 01/13/2020 1128   AST 61 (H) 01/13/2020 1128   AST 76 (H) 10/16/2018 0936   ALT 53 (H) 01/13/2020 1128   ALT 47 (H) 10/16/2018 0936   ALKPHOS 265 (H) 01/13/2020 1128   BILITOT 0.8 01/13/2020 1128   BILITOT 0.7 10/16/2018 0936   GFRNONAA >60 01/13/2020 1128   GFRNONAA >60 10/16/2018 0936   GFRAA >60 01/13/2020 1128   GFRAA >60 10/16/2018 0936    No results found for: TOTALPROTELP, ALBUMINELP, A1GS, A2GS, BETS, BETA2SER, GAMS, MSPIKE, SPEI  No results found for: KPAFRELGTCHN, LAMBDASER, KAPLAMBRATIO  Lab Results  Component Value Date   WBC 4.0 01/13/2020   NEUTROABS 1.9 01/13/2020   HGB 10.4 (L) 01/13/2020   HCT 33.8 (L) 01/13/2020   MCV 86.9 01/13/2020   PLT 184 01/13/2020    No results found for: LABCA2  No components found for: QIHKVQ259  No results for input(s): INR in the last 168 hours.  No results found for: LABCA2  Lab Results  Component Value Date   CAN199 103 (H) 12/23/2019    No results found for: DGL875  No results found for: IEP329  No results found for: CA2729  No components found for: HGQUANT  Lab Results  Component Value Date   CEA1 63.60 (H) 12/23/2019   /  CEA ([facility])  Date Value Ref Range Status  12/23/2019 63.60 (H) 0.00 - 5.00 ng/mL Final    Comment:    (NOTE) This test was performed using Architect's Chemiluminescent Microparticle Immunoassay. Values obtained from different assay methods cannot be used interchangeably. Please note that 5-10%  of patients who smoke may see CEA levels up to 6.9 ng/mL. Performed at [facility], [place] [address] [place], [place] [address]      No results found for: AFPTUMOR  No results found for: CHROMOGRNA  No results found for: HGBA, HGBA2QUANT, HGBFQUANT, HGBSQUAN (Hemoglobinopathy evaluation)   No results found for: LDH  Lab Results  Component Value Date   IRON 38 (L) 09/21/2018   TIBC 251 09/21/2018   IRONPCTSAT 15 (L) 09/21/2018   (Iron and TIBC)  Lab Results  Component Value Date   FERRITIN 138 09/21/2018    Urinalysis    Component Value Date/Time   COLORURINE YELLOW 02/18/2019 1056   APPEARANCEUR CLEAR 02/18/2019 1056   LABSPEC 1.018 02/18/2019 1056   PHURINE 6.0 02/18/2019 1056   GLUCOSEU NEGATIVE 02/18/2019 1056   GLUCOSEU NEGATIVE 07/20/2017 1056   HGBUR NEGATIVE 02/18/2019 1056   [place] 02/18/2019 1056   [place] 02/18/2019 1056   PROTEINUR 30 (A) 02/18/2019 1056   UROBILINOGEN 0.2 07/20/2017 1056   NITRITE NEGATIVE 02/18/2019 [place] 02/18/2019 1056    STUDIES:  ECHOCARDIOGRAM COMPLETE  Result Date: 12/30/2019    ECHOCARDIOGRAM REPORT   Patient Name:   DEREON CORKERY Date of Exam: 12/29/2019 Medical Rec #:  [id number]        Height:       64.5 in Accession #:    [id number]       Weight:       152.0 lb Date of Birth:  09-29-1934       BSA:          1.750 m Patient Age:    71  years         BP:           162/84 mmHg Patient Gender: M                HR:           62 bpm. Exam Location:  Outpatient Procedure: 2D Echo, 3D Echo, Cardiac Doppler, Color Doppler and Strain Analysis Indications:    I50.9* Heart failure (unspecified)  History:        Patient has prior history of Echocardiogram examinations, most                 recent 08/25/2019. Risk Factors:Hypertension. Cancer. Chemo.  Sonographer:    Roseanna Rainbow Referring Phys: [id number] DANIEL R BENSIMHON IMPRESSIONS  1. Left ventricular ejection fraction,  by estimation, is 55 to 60%. The left ventricle has normal function. The left ventricle has no regional wall motion abnormalities. There is mild left ventricular hypertrophy. Left ventricular diastolic parameters are consistent with Grade I diastolic dysfunction (impaired relaxation). The average left ventricular global longitudinal strain is -19.5 %.  2. Right ventricular systolic function is normal. The right ventricular size is normal. Mildly increased right ventricular wall thickness. The estimated right ventricular systolic pressure is 64.4 mmHg.  3. Left atrial size was mildly dilated.  4. Right atrial size was mildly dilated.  5. The mitral valve is normal in structure. Trivial mitral valve regurgitation. No evidence of mitral stenosis.  6. The aortic valve is abnormal. Aortic valve regurgitation is not visualized. Mild aortic valve sclerosis is present, with no evidence of aortic valve stenosis.  7. The inferior vena cava is dilated in size with >50% respiratory variability, suggesting right atrial pressure of 8 mmHg. Comparison(s): A prior study was performed on 08/25/2019. No significant change from prior study. FINDINGS  Left Ventricle: Left ventricular ejection fraction, by estimation, is 55 to 60%. The left ventricle has normal function. The left ventricle has no regional wall motion abnormalities. The average left ventricular global longitudinal strain is -19.5 %. The left ventricular internal cavity size was normal in size. There is mild left ventricular hypertrophy. Left ventricular diastolic parameters are consistent with Grade I diastolic dysfunction (impaired relaxation). Right Ventricle: The right ventricular size is normal. Mildly increased right ventricular wall thickness. Right ventricular systolic function is normal. The tricuspid regurgitant velocity is 2.49 m/s, and with an assumed right atrial pressure of 8 mmHg, the estimated right ventricular systolic pressure is 03.4 mmHg. Left Atrium:  Left atrial size was mildly dilated. Right Atrium: Right atrial size was mildly dilated. Pericardium: Trivial pericardial effusion is present. The pericardial effusion is circumferential. Mitral Valve: The mitral valve is normal in structure. Normal mobility of the mitral valve leaflets. Trivial mitral valve regurgitation. No evidence of mitral valve stenosis. Tricuspid Valve: The tricuspid valve is normal in structure. Tricuspid valve regurgitation is mild . No evidence of tricuspid stenosis. Aortic Valve: The aortic valve is abnormal. . There is moderate thickening and mild calcification of the aortic valve. Aortic valve regurgitation is not visualized. Mild aortic valve sclerosis is present, with no evidence of aortic valve stenosis. There is moderate thickening of the aortic valve. There is mild calcification of the aortic valve. Pulmonic Valve: The pulmonic valve was normal in structure. Pulmonic valve regurgitation is not visualized. No evidence of pulmonic stenosis. Aorta: Prominent descending thoracic aorta measuring 26 mm. The aortic root is normal in size and structure. Venous: The inferior vena cava is dilated in size with  greater than 50% respiratory variability, suggesting right atrial pressure of 8 mmHg. IAS/Shunts: No atrial level shunt detected by color flow Doppler.  LEFT VENTRICLE PLAX 2D LVIDd:         4.60 cm      Diastology LVIDs:         3.20 cm      LV e' lateral:   9.36 cm/s LV PW:         1.40 cm      LV E/e' lateral: 7.5 LV IVS:        1.10 cm      LV e' medial:    7.51 cm/s LVOT diam:     2.00 cm      LV E/e' medial:  9.3 LV SV:         84 LV SV Index:   48           2D Longitudinal Strain LVOT Area:     3.14 cm     2D Strain GLS Avg:     -19.5 %  LV Volumes (MOD) LV vol d, MOD A2C: 106.0 ml LV vol d, MOD A4C: 118.0 ml LV vol s, MOD A2C: 49.1 ml LV vol s, MOD A4C: 49.6 ml LV SV MOD A2C:     56.9 ml LV SV MOD A4C:     118.0 ml LV SV MOD BP:      67.7 ml RIGHT VENTRICLE             IVC RV S  prime:     14.50 cm/s  IVC diam: 2.10 cm TAPSE (M-mode): 2.5 cm LEFT ATRIUM             Index       RIGHT ATRIUM           Index LA diam:        3.70 cm 2.11 cm/m  RA Area:     17.30 cm LA Vol (A2C):   35.3 ml 20.17 ml/m RA Volume:   50.70 ml  28.97 ml/m LA Vol (A4C):   60.8 ml 34.74 ml/m LA Biplane Vol: 46.6 ml 26.62 ml/m  AORTIC VALVE LVOT Vmax:   128.00 cm/s LVOT Vmean:  91.100 cm/s LVOT VTI:    0.268 m  AORTA Ao Root diam: 2.90 cm Ao Asc diam:  3.40 cm MITRAL VALVE               TRICUSPID VALVE MV Area (PHT): 3.08 cm    TR Peak grad:   24.8 mmHg MV Decel Time: 246 msec    TR Vmax:        249.00 cm/s MV E velocity: 69.80 cm/s MV A velocity: 88.80 cm/s  SHUNTS MV E/A ratio:  0.79        Systemic VTI:  0.27 m                            Systemic Diam: 2.00 cm Peter Kaiser MD Electronically signed by Peter Kaiser MD Signature Date/Time: 12/30/2019/12:51:34 PM    Final      ELIGIBLE FOR AVAILABLE RESEARCH PROTOCOL: no   ASSESSMENT: 85 y.o. [place], [place] man status post gastric biopsy 09/20/2018 showing adenocarcinoma, with a CA 19-9 greater than 65,000; staging studies showing an apparent mass adjacent to the head of the pancreas, innumerable liver lesions, upper abdominal mesenteric and peripancreatic lymphadenopathy, and an isolated sclerotic density in the left iliac bone  (a) genomics  requested on gastric biopsy showed the tumor to be HER-2 amplified at 3+, PD-L1 positive, and T p53 mutated.  MSI is stable, mismatch repair status is proficient and the tumor mutational burden is intermediate.  (1) chest CT scan 09/19/2018 shows right lower lobe pulmonary embolism  (a) on intravenous heparin started 09/19/2018, transitioned to apixaban 10/01/2018  (2) genetics testing 10/04/2018: negative  (a) family history of prostate and early breast cancer; patient has a history of prostate cancer  (3) started gemcitabine/Abraxane 10/09/2018, repeated days 1, 8 and 15 of each 28-day cycle  (a)  stopped after 1 cycle (3 doses) with reassessment of diagnosis based on CARIS results, noted above  (4) started oxaliplatin, capecitabine and trastuzumab 11/06/2018  (a) baseline echocardiogram 09/20/2018 shows an ejection fraction in the 55-60% range  (b) oxaliplatin and trastuzumab every 21 days started 11/06/2018  (c) capecitabine dose 1000 mg twice daily for 14 days of every 21-day cycle  (d) CT of the abdomen and pelvis 02/11/2019 shows marked improvement in his liver lesions and resolution of periportal adenopathy  (e) tumor markers also show significant response,  (f) echocardiogram 02/12/2019 shows an ejection fraction in the 60-65% range  (g) CT abd/pelvis show stable/ improved disease, tumor markers nearly normal  (h) oxaliplatin stopped aftwer 04/30/2019 dose  (i) capecitabine stopped after September cycle   (5) trastuzumab maintenance started 05/20/2019, last dose 07/22/2019, with progression  (6) disease progression documented by lab work and CT scan 07/16/2019  (a) resumed FOLFOX 07/29/2019, continuing trastuzumab (every 14 days).  (b) restaging studies after 3 cycles showed evidence of progression (tumor markers equivocal)  (c) FOLFOX discontinued 08/26/2019  (7) started T-DM1 09/30/2019, repeat every 21 days  (a) pembrolizumab added 12/02/2019  (b) echocardiogram 08/25/2019 showed an ejection fraction in the 60-65% range  (c) repeat CT of the abdomen 12/11/2019 shows growth in 2 liver lesions  (8) started [facility] 12/23/2019 at 5.4 mg/kg.  (9) iron deficiency anemia:  (a) Feraheme scheduled for 04/22 /2021 and 01/08/2020   PLAN: Karlos is doing well today.  He is tolerating the Enhertu well and continues this every 3 weeks x 4 cycles, followed by restaging CT of his abdomen and pelvis.  He has no new issues with taking this.  His hemoglobin and energy level are markedly improved since receiving the two doses of IV iron on 4/22 and 4/29.    Izell Bandana and I reviewed his  overall plan of treatment established by Dr. Jana Hakim.  I requested his fourth enhertu dose be scheduled, and also placed orders for his CT abdomen/pelvis.  We are following his tumor markers in the interim as well.    Iain will return in 3 weeks for labs, f/u with Dr. Jana Hakim, and his next treatment.  He was recommended to continue current pandemic precautions.  He knows to call for any other issue that may develop before the next visit.  Total encounter time 20 minutes.Wilber Bihari, NP 01/13/20 12:17 PM Medical Oncology and Hematology [facility] [place], [place] [address] Tel. [phone]    Fax. [phone]    *Total Encounter Time as defined by the Centers for Medicare and Medicaid Services includes, in addition to the face-to-face time of a patient visit (documented in the note above) non-face-to-face time: obtaining and reviewing outside history, ordering and reviewing medications, tests or procedures, care coordination (communications with other health care professionals or caregivers) and documentation in the medical record.

## 2020-01-13 ENCOUNTER — Inpatient Hospital Stay: Payer: Medicare PPO

## 2020-01-13 ENCOUNTER — Inpatient Hospital Stay: Payer: Medicare PPO | Attending: Oncology

## 2020-01-13 ENCOUNTER — Inpatient Hospital Stay (HOSPITAL_BASED_OUTPATIENT_CLINIC_OR_DEPARTMENT_OTHER): Payer: Medicare PPO | Admitting: Adult Health

## 2020-01-13 ENCOUNTER — Other Ambulatory Visit: Payer: Self-pay

## 2020-01-13 ENCOUNTER — Encounter: Payer: Self-pay | Admitting: Adult Health

## 2020-01-13 VITALS — BP 144/79 | HR 70 | Temp 99.1°F | Resp 18 | Ht 64.5 in | Wt 146.8 lb

## 2020-01-13 DIAGNOSIS — Z8546 Personal history of malignant neoplasm of prostate: Secondary | ICD-10-CM | POA: Insufficient documentation

## 2020-01-13 DIAGNOSIS — C61 Malignant neoplasm of prostate: Secondary | ICD-10-CM

## 2020-01-13 DIAGNOSIS — C787 Secondary malignant neoplasm of liver and intrahepatic bile duct: Secondary | ICD-10-CM

## 2020-01-13 DIAGNOSIS — Z79899 Other long term (current) drug therapy: Secondary | ICD-10-CM | POA: Insufficient documentation

## 2020-01-13 DIAGNOSIS — Z5112 Encounter for antineoplastic immunotherapy: Secondary | ICD-10-CM | POA: Insufficient documentation

## 2020-01-13 DIAGNOSIS — C162 Malignant neoplasm of body of stomach: Secondary | ICD-10-CM | POA: Insufficient documentation

## 2020-01-13 DIAGNOSIS — R970 Elevated carcinoembryonic antigen [CEA]: Secondary | ICD-10-CM | POA: Insufficient documentation

## 2020-01-13 DIAGNOSIS — D509 Iron deficiency anemia, unspecified: Secondary | ICD-10-CM | POA: Diagnosis not present

## 2020-01-13 DIAGNOSIS — C169 Malignant neoplasm of stomach, unspecified: Secondary | ICD-10-CM

## 2020-01-13 DIAGNOSIS — Z7189 Other specified counseling: Secondary | ICD-10-CM

## 2020-01-13 DIAGNOSIS — D649 Anemia, unspecified: Secondary | ICD-10-CM

## 2020-01-13 DIAGNOSIS — C161 Malignant neoplasm of fundus of stomach: Secondary | ICD-10-CM

## 2020-01-13 DIAGNOSIS — C168 Malignant neoplasm of overlapping sites of stomach: Secondary | ICD-10-CM

## 2020-01-13 DIAGNOSIS — R978 Other abnormal tumor markers: Secondary | ICD-10-CM | POA: Diagnosis not present

## 2020-01-13 DIAGNOSIS — Z95828 Presence of other vascular implants and grafts: Secondary | ICD-10-CM

## 2020-01-13 LAB — CBC WITH DIFFERENTIAL/PLATELET
Abs Immature Granulocytes: 0.01 10*3/uL (ref 0.00–0.07)
Basophils Absolute: 0.1 10*3/uL (ref 0.0–0.1)
Basophils Relative: 2 %
Eosinophils Absolute: 0.3 10*3/uL (ref 0.0–0.5)
Eosinophils Relative: 6 %
HCT: 33.8 % — ABNORMAL LOW (ref 39.0–52.0)
Hemoglobin: 10.4 g/dL — ABNORMAL LOW (ref 13.0–17.0)
Immature Granulocytes: 0 %
Lymphocytes Relative: 32 %
Lymphs Abs: 1.3 10*3/uL (ref 0.7–4.0)
MCH: 26.7 pg (ref 26.0–34.0)
MCHC: 30.8 g/dL (ref 30.0–36.0)
MCV: 86.9 fL (ref 80.0–100.0)
Monocytes Absolute: 0.5 10*3/uL (ref 0.1–1.0)
Monocytes Relative: 12 %
Neutro Abs: 1.9 10*3/uL (ref 1.7–7.7)
Neutrophils Relative %: 48 %
Platelets: 184 10*3/uL (ref 150–400)
RBC: 3.89 MIL/uL — ABNORMAL LOW (ref 4.22–5.81)
RDW: 29.8 % — ABNORMAL HIGH (ref 11.5–15.5)
WBC: 4 10*3/uL (ref 4.0–10.5)
nRBC: 0 % (ref 0.0–0.2)

## 2020-01-13 LAB — COMPREHENSIVE METABOLIC PANEL
ALT: 53 U/L — ABNORMAL HIGH (ref 0–44)
AST: 61 U/L — ABNORMAL HIGH (ref 15–41)
Albumin: 3.3 g/dL — ABNORMAL LOW (ref 3.5–5.0)
Alkaline Phosphatase: 265 U/L — ABNORMAL HIGH (ref 38–126)
Anion gap: 7 (ref 5–15)
BUN: 13 mg/dL (ref 8–23)
CO2: 28 mmol/L (ref 22–32)
Calcium: 8.9 mg/dL (ref 8.9–10.3)
Chloride: 106 mmol/L (ref 98–111)
Creatinine, Ser: 1.11 mg/dL (ref 0.61–1.24)
GFR calc Af Amer: >60 mL/min (ref >60)
GFR calc non Af Amer: >60 mL/min (ref >60)
Glucose, Bld: 86 mg/dL (ref 70–99)
Potassium: 4 mmol/L (ref 3.5–5.1)
Sodium: 141 mmol/L (ref 135–145)
Total Bilirubin: 0.8 mg/dL (ref 0.3–1.2)
Total Protein: 6.8 g/dL (ref 6.5–8.1)

## 2020-01-13 LAB — TSH: TSH: 0.504 u[IU]/mL (ref 0.320–4.118)

## 2020-01-13 LAB — CEA (IN HOUSE-CHCC): CEA (CHCC-In House): 98.57 ng/mL — ABNORMAL HIGH (ref 0.00–5.00)

## 2020-01-13 MED ORDER — PALONOSETRON HCL INJECTION 0.25 MG/5ML
INTRAVENOUS | Status: AC
  Filled 2020-01-13: qty 5

## 2020-01-13 MED ORDER — PALONOSETRON HCL INJECTION 0.25 MG/5ML
0.2500 mg | Freq: Once | INTRAVENOUS | Status: AC
  Administered 2020-01-13: 0.25 mg via INTRAVENOUS

## 2020-01-13 MED ORDER — DIPHENHYDRAMINE HCL 25 MG PO CAPS
25.0000 mg | ORAL_CAPSULE | Freq: Once | ORAL | Status: AC
  Administered 2020-01-13: 25 mg via ORAL

## 2020-01-13 MED ORDER — HEPARIN SOD (PORK) LOCK FLUSH 100 UNIT/ML IV SOLN
500.0000 [IU] | Freq: Once | INTRAVENOUS | Status: AC | PRN
  Administered 2020-01-13: 14:00:00 500 [IU]
  Filled 2020-01-13: qty 5

## 2020-01-13 MED ORDER — DEXTROSE 5 % IV SOLN
Freq: Once | INTRAVENOUS | Status: AC
  Filled 2020-01-13: qty 250

## 2020-01-13 MED ORDER — ACETAMINOPHEN 325 MG PO TABS
ORAL_TABLET | ORAL | Status: AC
  Filled 2020-01-13: qty 2

## 2020-01-13 MED ORDER — DIPHENHYDRAMINE HCL 25 MG PO CAPS
ORAL_CAPSULE | ORAL | Status: AC
  Filled 2020-01-13: qty 2

## 2020-01-13 MED ORDER — FAM-TRASTUZUMAB DERUXTECAN-NXKI CHEMO 100 MG IV SOLR
5.4000 mg/kg | Freq: Once | INTRAVENOUS | Status: AC
  Administered 2020-01-13: 13:00:00 374 mg via INTRAVENOUS
  Filled 2020-01-13: qty 18.7

## 2020-01-13 MED ORDER — ACETAMINOPHEN 325 MG PO TABS
650.0000 mg | ORAL_TABLET | Freq: Once | ORAL | Status: AC
  Administered 2020-01-13: 13:00:00 650 mg via ORAL

## 2020-01-13 MED ORDER — SODIUM CHLORIDE 0.9 % IV SOLN
10.0000 mg | Freq: Once | INTRAVENOUS | Status: AC
  Administered 2020-01-13: 13:00:00 10 mg via INTRAVENOUS
  Filled 2020-01-13: qty 10

## 2020-01-13 MED ORDER — SODIUM CHLORIDE 0.9% FLUSH
10.0000 mL | INTRAVENOUS | Status: DC | PRN
  Administered 2020-01-13: 14:00:00 10 mL
  Filled 2020-01-13: qty 10

## 2020-01-13 NOTE — Patient Instructions (Signed)
[facility] Discharge Instructions for Patients Receiving Chemotherapy  Today you received the following chemotherapy agents: Enhertu  To help prevent nausea and vomiting after your treatment, we encourage you to take your nausea medication as directed.   If you develop nausea and vomiting that is not controlled by your nausea medication, call the clinic.   BELOW ARE SYMPTOMS THAT SHOULD BE REPORTED IMMEDIATELY:  *FEVER GREATER THAN 100.5 F  *CHILLS WITH OR WITHOUT FEVER  NAUSEA AND VOMITING THAT IS NOT CONTROLLED WITH YOUR NAUSEA MEDICATION  *UNUSUAL SHORTNESS OF BREATH  *UNUSUAL BRUISING OR BLEEDING  TENDERNESS IN MOUTH AND THROAT WITH OR WITHOUT PRESENCE OF ULCERS  *URINARY PROBLEMS  *BOWEL PROBLEMS  UNUSUAL RASH Items with * indicate a potential emergency and should be followed up as soon as possible.  Feel free to call the clinic should you have any questions or concerns. The clinic phone number is [phone].  Please show the CHEMO ALERT CARD at check-in to the Emergency Department and triage nurse.   

## 2020-01-14 ENCOUNTER — Telehealth: Payer: Self-pay | Admitting: Adult Health

## 2020-01-14 LAB — CANCER ANTIGEN 19-9: CA 19-9: 92 U/mL — ABNORMAL HIGH (ref 0–35)

## 2020-01-14 NOTE — Telephone Encounter (Signed)
Scheduled appts per 5/4 los. Pt confirmed appt date and time.

## 2020-01-16 ENCOUNTER — Encounter: Payer: Self-pay | Admitting: Pharmacy Technician

## 2020-01-16 NOTE — Progress Notes (Signed)
Patient no longer getting Keytruda from [employer] based on new treatment regimen. Last DOS covered is 12/02/19.

## 2020-02-03 ENCOUNTER — Inpatient Hospital Stay: Payer: Medicare PPO

## 2020-02-03 ENCOUNTER — Other Ambulatory Visit: Payer: Self-pay

## 2020-02-03 ENCOUNTER — Telehealth: Payer: Self-pay | Admitting: *Deleted

## 2020-02-03 ENCOUNTER — Inpatient Hospital Stay (HOSPITAL_BASED_OUTPATIENT_CLINIC_OR_DEPARTMENT_OTHER): Payer: Medicare PPO | Admitting: Oncology

## 2020-02-03 VITALS — BP 152/82 | HR 64 | Temp 98.5°F | Resp 18 | Ht 64.5 in | Wt 144.7 lb

## 2020-02-03 DIAGNOSIS — C799 Secondary malignant neoplasm of unspecified site: Secondary | ICD-10-CM | POA: Diagnosis not present

## 2020-02-03 DIAGNOSIS — C168 Malignant neoplasm of overlapping sites of stomach: Secondary | ICD-10-CM

## 2020-02-03 DIAGNOSIS — Z79899 Other long term (current) drug therapy: Secondary | ICD-10-CM | POA: Diagnosis not present

## 2020-02-03 DIAGNOSIS — C787 Secondary malignant neoplasm of liver and intrahepatic bile duct: Secondary | ICD-10-CM

## 2020-02-03 DIAGNOSIS — D649 Anemia, unspecified: Secondary | ICD-10-CM

## 2020-02-03 DIAGNOSIS — Z7189 Other specified counseling: Secondary | ICD-10-CM

## 2020-02-03 DIAGNOSIS — C162 Malignant neoplasm of body of stomach: Secondary | ICD-10-CM | POA: Diagnosis not present

## 2020-02-03 DIAGNOSIS — Z95828 Presence of other vascular implants and grafts: Secondary | ICD-10-CM

## 2020-02-03 DIAGNOSIS — C160 Malignant neoplasm of cardia: Secondary | ICD-10-CM | POA: Diagnosis not present

## 2020-02-03 DIAGNOSIS — C169 Malignant neoplasm of stomach, unspecified: Secondary | ICD-10-CM

## 2020-02-03 DIAGNOSIS — R970 Elevated carcinoembryonic antigen [CEA]: Secondary | ICD-10-CM | POA: Diagnosis not present

## 2020-02-03 DIAGNOSIS — R978 Other abnormal tumor markers: Secondary | ICD-10-CM | POA: Diagnosis not present

## 2020-02-03 DIAGNOSIS — Z5112 Encounter for antineoplastic immunotherapy: Secondary | ICD-10-CM | POA: Diagnosis not present

## 2020-02-03 DIAGNOSIS — Z8546 Personal history of malignant neoplasm of prostate: Secondary | ICD-10-CM | POA: Diagnosis not present

## 2020-02-03 DIAGNOSIS — D509 Iron deficiency anemia, unspecified: Secondary | ICD-10-CM | POA: Diagnosis not present

## 2020-02-03 DIAGNOSIS — C61 Malignant neoplasm of prostate: Secondary | ICD-10-CM

## 2020-02-03 LAB — COMPREHENSIVE METABOLIC PANEL
ALT: 53 U/L — ABNORMAL HIGH (ref 0–44)
AST: 64 U/L — ABNORMAL HIGH (ref 15–41)
Albumin: 3.2 g/dL — ABNORMAL LOW (ref 3.5–5.0)
Alkaline Phosphatase: 256 U/L — ABNORMAL HIGH (ref 38–126)
Anion gap: 7 (ref 5–15)
BUN: 12 mg/dL (ref 8–23)
CO2: 30 mmol/L (ref 22–32)
Calcium: 8.9 mg/dL (ref 8.9–10.3)
Chloride: 104 mmol/L (ref 98–111)
Creatinine, Ser: 0.99 mg/dL (ref 0.61–1.24)
GFR calc Af Amer: >60 mL/min (ref >60)
GFR calc non Af Amer: >60 mL/min (ref >60)
Glucose, Bld: 88 mg/dL (ref 70–99)
Potassium: 3.8 mmol/L (ref 3.5–5.1)
Sodium: 141 mmol/L (ref 135–145)
Total Bilirubin: 0.6 mg/dL (ref 0.3–1.2)
Total Protein: 6.4 g/dL — ABNORMAL LOW (ref 6.5–8.1)

## 2020-02-03 LAB — CBC WITH DIFFERENTIAL/PLATELET
Abs Immature Granulocytes: 0.01 10*3/uL (ref 0.00–0.07)
Basophils Absolute: 0.1 10*3/uL (ref 0.0–0.1)
Basophils Relative: 2 %
Eosinophils Absolute: 0.2 10*3/uL (ref 0.0–0.5)
Eosinophils Relative: 5 %
HCT: 35 % — ABNORMAL LOW (ref 39.0–52.0)
Hemoglobin: 11 g/dL — ABNORMAL LOW (ref 13.0–17.0)
Immature Granulocytes: 0 %
Lymphocytes Relative: 40 %
Lymphs Abs: 1.4 10*3/uL (ref 0.7–4.0)
MCH: 29.2 pg (ref 26.0–34.0)
MCHC: 31.4 g/dL (ref 30.0–36.0)
MCV: 92.8 fL (ref 80.0–100.0)
Monocytes Absolute: 0.5 10*3/uL (ref 0.1–1.0)
Monocytes Relative: 14 %
Neutro Abs: 1.4 10*3/uL — ABNORMAL LOW (ref 1.7–7.7)
Neutrophils Relative %: 39 %
Platelets: 176 10*3/uL (ref 150–400)
RBC: 3.77 MIL/uL — ABNORMAL LOW (ref 4.22–5.81)
WBC: 3.6 10*3/uL — ABNORMAL LOW (ref 4.0–10.5)
nRBC: 0 % (ref 0.0–0.2)

## 2020-02-03 MED ORDER — PALONOSETRON HCL INJECTION 0.25 MG/5ML
INTRAVENOUS | Status: AC
  Filled 2020-02-03: qty 5

## 2020-02-03 MED ORDER — PALONOSETRON HCL INJECTION 0.25 MG/5ML
0.2500 mg | Freq: Once | INTRAVENOUS | Status: AC
  Administered 2020-02-03: 0.25 mg via INTRAVENOUS

## 2020-02-03 MED ORDER — SODIUM CHLORIDE 0.9 % IV SOLN
10.0000 mg | Freq: Once | INTRAVENOUS | Status: AC
  Administered 2020-02-03: 10 mg via INTRAVENOUS
  Filled 2020-02-03: qty 10

## 2020-02-03 MED ORDER — DEXTROSE 5 % IV SOLN
Freq: Once | INTRAVENOUS | Status: AC
  Filled 2020-02-03: qty 250

## 2020-02-03 MED ORDER — FAM-TRASTUZUMAB DERUXTECAN-NXKI CHEMO 100 MG IV SOLR
420.0000 mg | Freq: Once | INTRAVENOUS | Status: AC
  Administered 2020-02-03: 420 mg via INTRAVENOUS
  Filled 2020-02-03: qty 21

## 2020-02-03 MED ORDER — HEPARIN SOD (PORK) LOCK FLUSH 100 UNIT/ML IV SOLN
500.0000 [IU] | Freq: Once | INTRAVENOUS | Status: AC | PRN
  Administered 2020-02-03: 500 [IU]
  Filled 2020-02-03: qty 5

## 2020-02-03 MED ORDER — SODIUM CHLORIDE 0.9% FLUSH
10.0000 mL | INTRAVENOUS | Status: DC | PRN
  Administered 2020-02-03: 10 mL
  Filled 2020-02-03: qty 10

## 2020-02-03 MED ORDER — SODIUM CHLORIDE 0.9% FLUSH
10.0000 mL | Freq: Once | INTRAVENOUS | Status: AC
  Administered 2020-02-03: 10 mL
  Filled 2020-02-03: qty 10

## 2020-02-03 MED ORDER — DIPHENHYDRAMINE HCL 25 MG PO CAPS
ORAL_CAPSULE | ORAL | Status: AC
  Filled 2020-02-03: qty 1

## 2020-02-03 MED ORDER — ACETAMINOPHEN 325 MG PO TABS
650.0000 mg | ORAL_TABLET | Freq: Once | ORAL | Status: AC
  Administered 2020-02-03: 650 mg via ORAL

## 2020-02-03 MED ORDER — DIPHENHYDRAMINE HCL 25 MG PO CAPS
25.0000 mg | ORAL_CAPSULE | Freq: Once | ORAL | Status: AC
  Administered 2020-02-03: 25 mg via ORAL

## 2020-02-03 MED ORDER — ACETAMINOPHEN 325 MG PO TABS
ORAL_TABLET | ORAL | Status: AC
  Filled 2020-02-03: qty 2

## 2020-02-03 NOTE — Telephone Encounter (Signed)
Per MD review - stated to treat despite abnormal labs including ANC , ALT, AST, and ALK PHOS.  Treatment room nurse notified of above.

## 2020-02-03 NOTE — Progress Notes (Signed)
[place]  [phone] [phone]     ID: Peter Wood DOB: 07-Sep-1935  MR#: [id number]  QIO#:[id number]  Patient Care Team: Cassandria Anger, MD as PCP - General Milus Banister, MD as Attending Physician (Gastroenterology) Magrinat, Virgie Dad, MD as Consulting Physician (Oncology) Gatha Mayer, MD as Consulting Physician (Gastroenterology) [name], MD as Consulting Physician (Ophthalmology) Ramon Dredge, MD as Referring Physician (Radiation Oncology) Larey Dresser, MD as Consulting Physician (Cardiology) OTHER MD:   CHIEF COMPLAINT: Metastatic gastric adenocarcinoma  CURRENT TREATMENT: Enhertu   INTERVAL HISTORY: Peter Wood returns today for follow-up and treatment of his metastatic gastric adenocarcinoma. His wife Peter Wood was not able to come today as she was busy he says. She still wants "a full report".  He was started on Enhertu on 12/23/2019. He receives this every three weeks and is tolerating this well.    He also received IV iron for his anemia related to chronic blood loss due to likely his gi malignancy.    His most recent echocardiogram on 12/29/2019 and showed an LVEF of 55-60%   REVIEW OF SYSTEMS: Peter Wood is tolerating treatment well. Wheeze he tells me his appetite is down a little and he is a little bit tired. He has some itching on the dorsum of both hands. He likes to take walks but he is now sensitive to the sun. He has no peripheral neuropathy, no change in bowel habits, and certainly no melena, also no cough phlegm production or pleurisy. Overall his functional status is very stable.   HISTORY OF CURRENT ILLNESS: From the original intake note:  Peter Wood presented to the emergency room 09/07/2018 with mid/upper abdominal pain.  He reported weight loss of 25 pounds over the past 6 months and significant anorexia.  His liver function tests were abnormal and a right upper quadrant ultrasound  was obtained.  This was read as concerning for malignancy and a CT of the abdomen and pelvis with contrast was obtained the same day, showing  1. Too numerous to count hypodense masses of the liver concerning for hepatic metastasis. In the region of the porta hepatis, there are hypodense lesions more likely associated with the liver though the pancreatic head is partially obscured by a 3.2 cm hypodense mass. Findings are more likely associated with the liver and less likely an isolated pancreatic mass. 2. Upper abdominal mesenteric and peripancreatic lymphadenopathy suspicious for metastatic disease. 3. 6 mm sclerotic density in the left iliac bone adjacent to the SI joint. Given history of prostate cancer, differential considerations would include osteoblastic metastasis or possibly bone island. Showed only diverticular disease.  He was referred to GI and Dr. Ardis Hughs notes a colonoscopy performed 04/23/2017 makes the possibility of colon cancer unlikely.  Further work-up was planned, but on 09/19/2018 the patient again presented to the emergency room with chest pain, now in a different area, and a staging CT scan of the chest showed filling defects in the right lower lobe pulmonary arteries consistent with embolism.  There was a small pericardial effusion and an additional 2.4 cm left thyroid mass.  Aortic atherosclerosis was noted  He was started on intravenous heparin and on 09/20/2018 underwent upper endoscopy under Dr. Carlean Purl showing a giant nonbleeding cratered gastric ulcer in the posterior wall of the stomach.  Biopsies of this procedure showed (SZA 20-187) adenocarcinoma.   At that point we were consulted and we obtained tumor markers which included a CEA of 1318.2, a  CA 19-9 of 67,479, a normal AFP and a normal PSA.  I discussed the case with pathology and they do not feel that additional testing would be helpful in terms of discriminating between a primary gastric and a primary  biliary/pancreatic tumor.  The patient's subsequent history is as detailed below.   PAST MEDICAL HISTORY: Past Medical History:  Diagnosis Date  . Arthritis   . Diverticulosis of colon   . Family history of breast cancer   . Family history of prostate cancer   . HTN (hypertension)   . Hx of colonic polyp   . Pneumonia    as a child/baby  . Poor circulation of extremity    right leg  . Prostate cancer ([place]) 2015   prostate cancer - radiation treated    PAST SURGICAL HISTORY: Past Surgical History:  Procedure Laterality Date  . [place] VITRECTOMY WITH 20 GAUGE MVR PORT FOR MACULAR HOLE Right 09/19/2016   Procedure: 25 GAUGE PARS PLANA VITRECTOMY WITH 20 GAUGE MVR PORT FOR MACULAR HOLE, MEMBRANE PEEL, SERUM PATCH, GAS FLUID EXCHANGE, HEADSCOPE LASER;  Surgeon: [name], MD;  Location: [facility];  Service: Ophthalmology;  Laterality: Right;  . BIOPSY  09/20/2018   Procedure: BIOPSY;  Surgeon: Gatha Mayer, MD;  Location[facility] ENDOSCOPY;  Service: Endoscopy;;  . CATARACT EXTRACTION  08/27/12   Right  . COLONOSCOPY    . ESOPHAGOGASTRODUODENOSCOPY (EGD) WITH PROPOFOL N/A 09/20/2018   Procedure: ESOPHAGOGASTRODUODENOSCOPY (EGD) WITH PROPOFOL;  Surgeon: Gatha Mayer, MD;  Location: [place];  Service: Endoscopy;  Laterality: N/A;  . HAND SURGERY  2016   right thumb  . IR IMAGING GUIDED PORT INSERTION  11/04/2018  . KNEE SURGERY Left 1989    FAMILY HISTORY: Family History  Problem Relation Age of Onset  . Prostate cancer Wood        dx >50  . Hypertension Wood   . Ulcers Wood   . Breast cancer Sister        dx 20's  . Prostate cancer Brother        dx under 48, metasttic, cause of his death  . Ulcers Brother   . Ulcers Sister    Peter Wood died from heart complications with a pacemaker at age 25; Peter Wood also has prostate cancer. Peter Wood died from natural causes at age 69. The patient has 4 brothers and 3 sisters. One of  Peter Wood sisters had breast cancer in her 70s and a brother had prostate cancer. Patient denies anyone in her family having ovarian, or pancreatic cancer. Peter Wood mentions that his youngest sister and his brother with prostate cancer also had stomach ulcers.    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 His wife, Peter Wood, is a retired [employer] summer Radiographer, therapeutic. As of 09/2018, they have been married for 56 years. They have 4 children, Claiborne Billings, [place], [place], and [place]. Claiborne Billings lives in [place] and works at the [employer]. Sherrod lives in [place] and works at [employer] and as a part Horticulturist, commercial. Germane lives in [place] and is a Engineer, drilling for a medical company. Beverely Low lives in [place] and works at [employer]. Ardian has 7 grandchildren and 1 step great grandchild. He attends [employer].    ADVANCED DIRECTIVES: His wife, Peter Wood, is automatically his healthcare power of attorney.     HEALTH MAINTENANCE: Social History   Tobacco Use  . Smoking status: Former Smoker    Years:  4.00    Quit date: 09/12/1967    Years since quitting: 52.4  . Smokeless tobacco: Never Used  Substance Use Topics  . Alcohol use: No  . Drug use: No    Colonoscopy: 2018/[facility]  PSA: 0.54 on 09/18/2018  No Known Allergies  Current Outpatient Medications  Medication Sig Dispense Refill  . amLODipine (NORVASC) 5 MG tablet Take 1 tablet (5 mg total) by mouth daily. 90 tablet 3  . apixaban (ELIQUIS) 2.5 MG TABS tablet Take 1 tablet (2.5 mg total) by mouth 2 (two) times daily. 60 tablet 11  . benazepril (LOTENSIN) 40 MG tablet Take 1 tablet (40 mg total) by mouth daily. 90 tablet 3  . Cholecalciferol 1000 UNITS tablet Take 1,000 Units by mouth daily.     Mar[place] Kitchen dexamethasone (DECADRON) 4 MG tablet Take 2 tablets (8 mg total) by mouth daily. Start the day after chemotherapy for 2 days. 30 tablet 1  . Elderberry 575 MG/5ML SYRP Take by mouth.    . latanoprost (XALATAN)  0.005 % ophthalmic solution Place 1 drop into both eyes at bedtime.     . lidocaine-prilocaine (EMLA) cream Apply to affected area once 30 g 3  . ondansetron (ZOFRAN) 4 MG tablet Take 1 tablet (4 mg total) by mouth every 8 (eight) hours as needed for nausea or vomiting. 20 tablet 1   No current facility-administered medications for this visit.   Facility-Administered Medications Ordered in Other Visits  Medication Dose Route Frequency Provider Last Rate Last Admin  . sodium chloride flush (NS) 0.9 % injection 10 mL  10 mL Intravenous PRN Magrinat, Virgie Dad, MD      . sodium chloride flush (NS) 0.9 % injection 10 mL  10 mL Intracatheter PRN Magrinat, Virgie Dad, MD   10 mL at 12/23/19 1238    OBJECTIVE:  African-American man in no acute distress  Vitals:   02/03/20 1441  BP: (!) 152/82  Pulse: 64  Resp: 18  Temp: 98.5 F (36.9 C)  SpO2: 100%   Wt Readings from Last 3 Encounters:  02/03/20 144 lb 11.2 oz (65.6 kg)  01/13/20 146 lb 12.8 oz (66.6 kg)  12/29/19 148 lb 12.8 oz (67.5 kg)   Body mass index is 24.45 kg/m.    ECOG FS:1 - Symptomatic but completely ambulatory  Sclerae unicteric, EOMs intact Wearing a mask No cervical or supraclavicular adenopathy Lungs no rales or rhonchi Heart regular rate and rhythm Abd soft, nontender, positive bowel sounds MSK no focal spinal tenderness, no upper extremity lymphedema Neuro: nonfocal, well oriented, appropriate affect Breasts: Deferred   LAB RESULTS:  CMP     Component Value Date/Time   NA 141 01/13/2020 1128   K 4.0 01/13/2020 1128   CL 106 01/13/2020 1128   CO2 28 01/13/2020 1128   GLUCOSE 86 01/13/2020 1128   GLUCOSE 105 (H) 09/13/2006 0900   BUN 13 01/13/2020 1128   CREATININE 1.11 01/13/2020 1128   CREATININE 1.02 10/16/2018 0936   CALCIUM 8.9 01/13/2020 1128   PROT 6.8 01/13/2020 1128   ALBUMIN 3.3 (L) 01/13/2020 1128   AST 61 (H) 01/13/2020 1128   AST 76 (H) 10/16/2018 0936   ALT 53 (H) 01/13/2020 1128    ALT 47 (H) 10/16/2018 0936   ALKPHOS 265 (H) 01/13/2020 1128   BILITOT 0.8 01/13/2020 1128   BILITOT 0.7 10/16/2018 0936   GFRNONAA >60 01/13/2020 1128   GFRNONAA >60 10/16/2018 0936   GFRAA >60 01/13/2020 1128   GFRAA >60 10/16/2018  0936    No results found for: TOTALPROTELP, ALBUMINELP, A1GS, A2GS, BETS, BETA2SER, GAMS, MSPIKE, SPEI  No results found for: KPAFRELGTCHN, LAMBDASER, KAPLAMBRATIO  Lab Results  Component Value Date   WBC 3.6 (L) 02/03/2020   NEUTROABS 1.4 (L) 02/03/2020   HGB 11.0 (L) 02/03/2020   HCT 35.0 (L) 02/03/2020   MCV 92.8 02/03/2020   PLT 176 02/03/2020    No results found for: LABCA2  No components found for: ZOXWRU045  No results for input(s): INR in the last 168 hours.  No results found for: LABCA2  Lab Results  Component Value Date   CAN199 92 (H) 01/13/2020    No results found for: WUJ811  No results found for: BJY782  No results found for: CA2729  No components found for: HGQUANT  Lab Results  Component Value Date   CEA1 98.57 (H) 01/13/2020   /  CEA ([facility]In [facility])  Date Value Ref Range Status  01/13/2020 98.57 (H) 0.00 - 5.00 ng/mL Final    Comment:    (NOTE) This test was performed using Architect's Chemiluminescent Microparticle Immunoassay. Values obtained from different assay methods cannot be used interchangeably. Please note that 5-10% of patients who smoke may see CEA levels up to 6.9 ng/mL. Performed at [facility], [place] [address], [place], [place] [address]      No results found for: AFPTUMOR  No results found for: CHROMOGRNA  No results found for: HGBA, HGBA2QUANT, HGBFQUANT, HGBSQUAN (Hemoglobinopathy evaluation)   No results found for: LDH  Lab Results  Component Value Date   IRON 38 (L) 09/21/2018   TIBC 251 09/21/2018   IRONPCTSAT 15 (L) 09/21/2018   (Iron and TIBC)  Lab Results  Component Value Date   FERRITIN 138 09/21/2018    Urinalysis    Component Value  Date/Time   COLORURINE YELLOW 02/18/2019 1056   APPEARANCEUR CLEAR 02/18/2019 1056   LABSPEC 1.018 02/18/2019 1056   PHURINE 6.0 02/18/2019 1056   GLUCOSEU NEGATIVE 02/18/2019 [place] 07/20/2017 1056   HGBUR NEGATIVE 02/18/2019 [place] 02/18/2019 [place] 02/18/2019 1056   PROTEINUR 30 (A) 02/18/2019 1056   UROBILINOGEN 0.2 07/20/2017 1056   NITRITE NEGATIVE 02/18/2019 [place] 02/18/2019 1056    STUDIES:  No results found.   ELIGIBLE FOR AVAILABLE RESEARCH PROTOCOL: no   ASSESSMENT: 85 y.o. [place], [place] man status post gastric biopsy 09/20/2018 showing adenocarcinoma, with a CA 19-9 greater than 65,000; staging studies showing an apparent mass adjacent to the head of the pancreas, innumerable liver lesions, upper abdominal mesenteric and peripancreatic lymphadenopathy, and an isolated sclerotic density in the left iliac bone  (a) genomics requested on gastric biopsy showed the tumor to be HER-2 amplified at 3+, PD-L1 positive, and T p53 mutated.  MSI is stable, mismatch repair status is proficient and the tumor mutational burden is intermediate.  (1) chest CT scan 09/19/2018 shows right lower lobe pulmonary embolism  (a) on intravenous heparin started 09/19/2018, transitioned to apixaban 10/01/2018  (2) genetics testing 10/04/2018: negative  (a) family history of prostate and early breast cancer; patient has a history of prostate cancer  (3) started gemcitabine/Abraxane 10/09/2018, repeated days 1, 8 and 15 of each 28-day cycle  (a) stopped after 1 cycle (3 doses) with reassessment of diagnosis based on CARIS results, noted above  (4) started oxaliplatin, capecitabine and trastuzumab 11/06/2018  (a) baseline echocardiogram 09/20/2018 shows an ejection fraction in the 55-60%  range  (b) oxaliplatin and trastuzumab every 21 days started 11/06/2018  (c) capecitabine dose 1000 mg twice daily for 14 days of  every 21-day cycle  (d) CT of the abdomen and pelvis 02/11/2019 shows marked improvement in his liver lesions and resolution of periportal adenopathy  (e) tumor markers also show significant response,  (f) echocardiogram 02/12/2019 shows an ejection fraction in the 60-65% range  (g) CT abd/pelvis show stable/ improved disease, tumor markers nearly normal  (h) oxaliplatin stopped aftwer 04/30/2019 dose  (i) capecitabine stopped after September cycle   (5) trastuzumab maintenance started 05/20/2019, last dose 07/22/2019, with progression  (6) disease progression documented by lab work and CT scan 07/16/2019  (a) resumed FOLFOX 07/29/2019, continuing trastuzumab (every 14 days).  (b) restaging studies after 3 cycles showed evidence of progression (tumor markers equivocal)  (c) FOLFOX discontinued 08/26/2019  (7) started T-DM1 09/30/2019, repeat every 21 days  (a) pembrolizumab added 12/02/2019  (b) echocardiogram 08/25/2019 showed an ejection fraction in the 60-65% range  (c) repeat CT of the abdomen 12/11/2019 shows growth in 2 liver lesions  (8) started [facility] 12/23/2019 at 5.4 mg/kg.  (a) dose increased to 6.4 mg/kg with the third dose 0525 2021  (9) iron deficiency anemia:  (a) Feraheme scheduled for 04/22 /2021 and 01/08/2020    PLAN: Peter Wood is having some symptoms from the Enhertu but overall he is tolerating it remarkably well. Again his counts are down a little but not as much as I had feared. In the meantime his CEA has inched up some.  Accordingly I think we can increase his dose to target, which we are doing today. I discussed that with him and he understands that he may have some more symptoms from this dose and he will make sure to let us know if that is the case.  Otherwise he will return in 3 weeks for his next dose and visit and see me again in 6 weeks. Before the 6-week visit he will have a repeat CT of the abdomen and pelvis and a chest x-ray  Total encounter time 30  minutes.Sarajane Jews C. Magrinat, MD 02/03/20 3:03 PM Medical Oncology and Hematology [facility] [place], [place] [address] Tel. [phone]    Fax. [phone]   I, Wilburn Mylar, am acting as scribe for Dr. Virgie Dad. Magrinat.  I, Lurline Del MD, have reviewed the above documentation for accuracy and completeness, and I agree with the above.   *Total Encounter Time as defined by the Centers for Medicare and Medicaid Services includes, in addition to the face-to-face time of a patient visit (documented in the note above) non-face-to-face time: obtaining and reviewing outside history, ordering and reviewing medications, tests or procedures, care coordination (communications with other health care professionals or caregivers) and documentation in the medical record.

## 2020-02-03 NOTE — Progress Notes (Signed)
Dose clarification w/ MD for Enhertu.  We did not round down on the dose for vial size.  Kennith Center, Pharm.D., CPP 02/03/2020@3 :41 PM

## 2020-02-03 NOTE — Patient Instructions (Signed)
[facility] Discharge Instructions for Patients Receiving Chemotherapy  Today you received the following chemotherapy agents: Enhertu  To help prevent nausea and vomiting after your treatment, we encourage you to take your nausea medication as directed.   If you develop nausea and vomiting that is not controlled by your nausea medication, call the clinic.   BELOW ARE SYMPTOMS THAT SHOULD BE REPORTED IMMEDIATELY:  *FEVER GREATER THAN 100.5 F  *CHILLS WITH OR WITHOUT FEVER  NAUSEA AND VOMITING THAT IS NOT CONTROLLED WITH YOUR NAUSEA MEDICATION  *UNUSUAL SHORTNESS OF BREATH  *UNUSUAL BRUISING OR BLEEDING  TENDERNESS IN MOUTH AND THROAT WITH OR WITHOUT PRESENCE OF ULCERS  *URINARY PROBLEMS  *BOWEL PROBLEMS  UNUSUAL RASH Items with * indicate a potential emergency and should be followed up as soon as possible.  Feel free to call the clinic should you have any questions or concerns. The clinic phone number is [phone].  Please show the CHEMO ALERT CARD at check-in to the Emergency Department and triage nurse.   

## 2020-02-04 LAB — CEA (IN HOUSE-CHCC): CEA (CHCC-In House): 94.36 ng/mL — ABNORMAL HIGH (ref 0.00–5.00)

## 2020-02-04 LAB — TSH: TSH: 0.571 u[IU]/mL (ref 0.320–4.118)

## 2020-02-04 LAB — CANCER ANTIGEN 19-9: CA 19-9: 71 U/mL — ABNORMAL HIGH (ref 0–35)

## 2020-02-24 ENCOUNTER — Inpatient Hospital Stay: Payer: Medicare PPO

## 2020-02-24 ENCOUNTER — Other Ambulatory Visit: Payer: Self-pay

## 2020-02-24 ENCOUNTER — Inpatient Hospital Stay (HOSPITAL_BASED_OUTPATIENT_CLINIC_OR_DEPARTMENT_OTHER): Payer: Medicare PPO | Admitting: Adult Health

## 2020-02-24 ENCOUNTER — Inpatient Hospital Stay: Payer: Medicare PPO | Attending: Oncology

## 2020-02-24 VITALS — BP 134/80 | HR 62 | Temp 98.7°F | Resp 17 | Ht 64.5 in | Wt 141.4 lb

## 2020-02-24 DIAGNOSIS — D649 Anemia, unspecified: Secondary | ICD-10-CM

## 2020-02-24 DIAGNOSIS — C61 Malignant neoplasm of prostate: Secondary | ICD-10-CM

## 2020-02-24 DIAGNOSIS — C787 Secondary malignant neoplasm of liver and intrahepatic bile duct: Secondary | ICD-10-CM

## 2020-02-24 DIAGNOSIS — Z95828 Presence of other vascular implants and grafts: Secondary | ICD-10-CM

## 2020-02-24 DIAGNOSIS — Z5112 Encounter for antineoplastic immunotherapy: Secondary | ICD-10-CM | POA: Insufficient documentation

## 2020-02-24 DIAGNOSIS — C160 Malignant neoplasm of cardia: Secondary | ICD-10-CM

## 2020-02-24 DIAGNOSIS — Z79899 Other long term (current) drug therapy: Secondary | ICD-10-CM | POA: Diagnosis not present

## 2020-02-24 DIAGNOSIS — Z7189 Other specified counseling: Secondary | ICD-10-CM

## 2020-02-24 DIAGNOSIS — C168 Malignant neoplasm of overlapping sites of stomach: Secondary | ICD-10-CM

## 2020-02-24 DIAGNOSIS — R978 Other abnormal tumor markers: Secondary | ICD-10-CM | POA: Insufficient documentation

## 2020-02-24 DIAGNOSIS — C799 Secondary malignant neoplasm of unspecified site: Secondary | ICD-10-CM

## 2020-02-24 DIAGNOSIS — Z8546 Personal history of malignant neoplasm of prostate: Secondary | ICD-10-CM | POA: Insufficient documentation

## 2020-02-24 DIAGNOSIS — C162 Malignant neoplasm of body of stomach: Secondary | ICD-10-CM | POA: Diagnosis not present

## 2020-02-24 DIAGNOSIS — C169 Malignant neoplasm of stomach, unspecified: Secondary | ICD-10-CM

## 2020-02-24 LAB — CBC WITH DIFFERENTIAL/PLATELET
Abs Immature Granulocytes: 0.02 10*3/uL (ref 0.00–0.07)
Basophils Absolute: 0.1 10*3/uL (ref 0.0–0.1)
Basophils Relative: 1 %
Eosinophils Absolute: 0.2 10*3/uL (ref 0.0–0.5)
Eosinophils Relative: 4 %
HCT: 35 % — ABNORMAL LOW (ref 39.0–52.0)
Hemoglobin: 11.4 g/dL — ABNORMAL LOW (ref 13.0–17.0)
Immature Granulocytes: 1 %
Lymphocytes Relative: 36 %
Lymphs Abs: 1.5 10*3/uL (ref 0.7–4.0)
MCH: 31.1 pg (ref 26.0–34.0)
MCHC: 32.6 g/dL (ref 30.0–36.0)
MCV: 95.6 fL (ref 80.0–100.0)
Monocytes Absolute: 0.5 10*3/uL (ref 0.1–1.0)
Monocytes Relative: 13 %
Neutro Abs: 1.8 10*3/uL (ref 1.7–7.7)
Neutrophils Relative %: 45 %
Platelets: 190 10*3/uL (ref 150–400)
RBC: 3.66 MIL/uL — ABNORMAL LOW (ref 4.22–5.81)
RDW: 24.3 % — ABNORMAL HIGH (ref 11.5–15.5)
WBC: 4 10*3/uL (ref 4.0–10.5)
nRBC: 0 % (ref 0.0–0.2)

## 2020-02-24 LAB — COMPREHENSIVE METABOLIC PANEL
ALT: 56 U/L — ABNORMAL HIGH (ref 0–44)
AST: 70 U/L — ABNORMAL HIGH (ref 15–41)
Albumin: 3.1 g/dL — ABNORMAL LOW (ref 3.5–5.0)
Alkaline Phosphatase: 278 U/L — ABNORMAL HIGH (ref 38–126)
Anion gap: 9 (ref 5–15)
BUN: 11 mg/dL (ref 8–23)
CO2: 28 mmol/L (ref 22–32)
Calcium: 9.1 mg/dL (ref 8.9–10.3)
Chloride: 103 mmol/L (ref 98–111)
Creatinine, Ser: 0.96 mg/dL (ref 0.61–1.24)
GFR calc Af Amer: >60 mL/min (ref >60)
GFR calc non Af Amer: >60 mL/min (ref >60)
Glucose, Bld: 95 mg/dL (ref 70–99)
Potassium: 3.5 mmol/L (ref 3.5–5.1)
Sodium: 140 mmol/L (ref 135–145)
Total Bilirubin: 0.7 mg/dL (ref 0.3–1.2)
Total Protein: 6.6 g/dL (ref 6.5–8.1)

## 2020-02-24 LAB — TSH: TSH: 0.833 u[IU]/mL (ref 0.350–4.500)

## 2020-02-24 MED ORDER — ACETAMINOPHEN 325 MG PO TABS
ORAL_TABLET | ORAL | Status: AC
  Filled 2020-02-24: qty 2

## 2020-02-24 MED ORDER — PALONOSETRON HCL INJECTION 0.25 MG/5ML
INTRAVENOUS | Status: AC
  Filled 2020-02-24: qty 5

## 2020-02-24 MED ORDER — ACETAMINOPHEN 325 MG PO TABS
650.0000 mg | ORAL_TABLET | Freq: Once | ORAL | Status: AC
  Administered 2020-02-24: 650 mg via ORAL

## 2020-02-24 MED ORDER — DIPHENHYDRAMINE HCL 25 MG PO CAPS
25.0000 mg | ORAL_CAPSULE | Freq: Once | ORAL | Status: AC
  Administered 2020-02-24: 25 mg via ORAL

## 2020-02-24 MED ORDER — FAM-TRASTUZUMAB DERUXTECAN-NXKI CHEMO 100 MG IV SOLR
6.1500 mg/kg | Freq: Once | INTRAVENOUS | Status: AC
  Administered 2020-02-24: 400 mg via INTRAVENOUS
  Filled 2020-02-24: qty 20

## 2020-02-24 MED ORDER — DEXTROSE 5 % IV SOLN
Freq: Once | INTRAVENOUS | Status: AC
  Filled 2020-02-24: qty 250

## 2020-02-24 MED ORDER — SODIUM CHLORIDE 0.9 % IV SOLN
10.0000 mg | Freq: Once | INTRAVENOUS | Status: AC
  Administered 2020-02-24: 10 mg via INTRAVENOUS
  Filled 2020-02-24: qty 10

## 2020-02-24 MED ORDER — SODIUM CHLORIDE 0.9% FLUSH
10.0000 mL | INTRAVENOUS | Status: DC | PRN
  Administered 2020-02-24: 10 mL
  Filled 2020-02-24: qty 10

## 2020-02-24 MED ORDER — DIPHENHYDRAMINE HCL 25 MG PO CAPS
ORAL_CAPSULE | ORAL | Status: AC
  Filled 2020-02-24: qty 2

## 2020-02-24 MED ORDER — PREDNISONE 5 MG PO TABS: 5.0000 mg | ORAL_TABLET | Freq: Every day | ORAL | 0 refills | Status: DC

## 2020-02-24 MED ORDER — PALONOSETRON HCL INJECTION 0.25 MG/5ML
0.2500 mg | Freq: Once | INTRAVENOUS | Status: AC
  Administered 2020-02-24: 0.25 mg via INTRAVENOUS

## 2020-02-24 MED ORDER — FAM-TRASTUZUMAB DERUXTECAN-NXKI CHEMO 100 MG IV SOLR
6.4500 mg/kg | Freq: Once | INTRAVENOUS | Status: DC
  Filled 2020-02-24: qty 21

## 2020-02-24 MED ORDER — HEPARIN SOD (PORK) LOCK FLUSH 100 UNIT/ML IV SOLN
500.0000 [IU] | Freq: Once | INTRAVENOUS | Status: AC | PRN
  Administered 2020-02-24: 500 [IU]
  Filled 2020-02-24: qty 5

## 2020-02-24 NOTE — Patient Instructions (Signed)
[facility] Discharge Instructions for Patients Receiving Chemotherapy  Today you received the following chemotherapy agents: Enhertu  To help prevent nausea and vomiting after your treatment, we encourage you to take your nausea medication as directed.   If you develop nausea and vomiting that is not controlled by your nausea medication, call the clinic.   BELOW ARE SYMPTOMS THAT SHOULD BE REPORTED IMMEDIATELY:  *FEVER GREATER THAN 100.5 F  *CHILLS WITH OR WITHOUT FEVER  NAUSEA AND VOMITING THAT IS NOT CONTROLLED WITH YOUR NAUSEA MEDICATION  *UNUSUAL SHORTNESS OF BREATH  *UNUSUAL BRUISING OR BLEEDING  TENDERNESS IN MOUTH AND THROAT WITH OR WITHOUT PRESENCE OF ULCERS  *URINARY PROBLEMS  *BOWEL PROBLEMS  UNUSUAL RASH Items with * indicate a potential emergency and should be followed up as soon as possible.  Feel free to call the clinic should you have any questions or concerns. The clinic phone number is [phone].  Please show the CHEMO ALERT CARD at check-in to the Emergency Department and triage nurse.   

## 2020-02-24 NOTE — Progress Notes (Addendum)
Peter Wood  [phone] Fax:([phone]     ID: Peter Wood DOB: 20-Jan-1935  MR#: [id number]  GYI#:[id number]  Patient Care Team: Peter Anger, MD as PCP - General Peter Banister, MD as Attending Physician (Gastroenterology) Magrinat, Virgie Dad, MD as Consulting Physician (Oncology) Peter Mayer, MD as Consulting Physician (Gastroenterology) Peter Pedro, MD as Consulting Physician (Ophthalmology) Peter Dredge, MD as Referring Physician (Radiation Oncology) Peter Dresser, MD as Consulting Physician (Cardiology) OTHER MD:   CHIEF COMPLAINT: Metastatic gastric adenocarcinoma  CURRENT TREATMENT: Enhertu   INTERVAL HISTORY: Peter Wood returns today for follow-up and treatment of his metastatic gastric adenocarcinoma. His wife Peter Wood was not able to come today as she was busy he says. She still wants "a full report".  He was started on Enhertu on 12/23/2019. He receives this every three weeks and is tolerating this well.    His most recent echocardiogram on 12/29/2019 and showed an LVEF of 55-60%.    He is scheduled for CT abdomen/pelvis and a chest xray on 02/26/2020.   REVIEW OF SYSTEMS: Peter Wood is doing moderately well.  He notes that he has a decreased appetite at dinner time.  He has lost about 5 pounds since May, 4, 2021.  His hands are itching.  He notes this is intermittent.  Sometimes he itches on his elbow.    Peter Wood denies new pain, nausea, vomiting, shortness of breath.  He occ. Coughs at night.  His activity level is declining.  He says this is related to malaise and fatigue, so he will sleep those days.  No recent fever or chills.  A detailed ROS was otherwise non contributory.     HISTORY OF CURRENT ILLNESS: From the original intake note:  Peter Wood presented to the emergency room 09/07/2018 with mid/upper abdominal pain.  He reported weight loss of 25 pounds over the past 6 months and significant  anorexia.  His liver function tests were abnormal and a right upper quadrant ultrasound was obtained.  This was read as concerning for malignancy and a CT of the abdomen and pelvis with contrast was obtained the same day, showing  1. Too numerous to count hypodense masses of the liver concerning for hepatic metastasis. In the region of the porta hepatis, there are hypodense lesions more likely associated with the liver though the pancreatic head is partially obscured by a 3.2 cm hypodense mass. Findings are more likely associated with the liver and less likely an isolated pancreatic mass. 2. Upper abdominal mesenteric and peripancreatic lymphadenopathy suspicious for metastatic disease. 3. 6 mm sclerotic density in the left iliac bone adjacent to the SI joint. Given history of prostate cancer, differential considerations would include osteoblastic metastasis or possibly bone island. Showed only diverticular disease.  He was referred to GI and Peter Wood notes a colonoscopy performed 04/23/2017 makes the possibility of colon cancer unlikely.  Further work-up was planned, but on 09/19/2018 the patient again presented to the emergency room with chest pain, now in a different area, and a staging CT scan of the chest showed filling defects in the right lower lobe pulmonary arteries consistent with embolism.  There was a small pericardial effusion and an additional 2.4 cm left thyroid mass.  Aortic atherosclerosis was noted  He was started on intravenous heparin and on 09/20/2018 underwent upper endoscopy under Peter Wood showing a giant nonbleeding cratered gastric ulcer in the posterior wall of the stomach.  Biopsies of this procedure showed (Peter Wood) adenocarcinoma.   At that point we were consulted and we obtained tumor markers which included a CEA of 1318.2, a CA 19-9 of 67,479, a normal AFP and a normal PSA.  I discussed the case with pathology and they do not feel that additional testing would be  helpful in terms of discriminating between a primary gastric and a primary biliary/pancreatic tumor.  The patient's subsequent history is as detailed below.   PAST MEDICAL HISTORY: Past Medical History:  Diagnosis Date  . Arthritis   . Diverticulosis of colon   . Family history of breast cancer   . Family history of prostate cancer   . HTN (hypertension)   . Hx of colonic polyp   . Pneumonia    as a child/baby  . Poor circulation of extremity    right leg  . Prostate cancer ([place]) 2015   prostate cancer - radiation treated    PAST SURGICAL HISTORY: Past Surgical History:  Procedure Laterality Date  . [place] VITRECTOMY WITH 20 GAUGE MVR PORT FOR MACULAR HOLE Right 09/19/2016   Procedure: 25 GAUGE PARS PLANA VITRECTOMY WITH 20 GAUGE MVR PORT FOR MACULAR HOLE, MEMBRANE PEEL, SERUM PATCH, GAS FLUID EXCHANGE, HEADSCOPE LASER;  Surgeon: Peter Pedro, MD;  Location: [facility];  Service: Ophthalmology;  Laterality: Right;  . BIOPSY  09/20/2018   Procedure: BIOPSY;  Surgeon: Peter Mayer, MD;  Location: [facility] ENDOSCOPY;  Service: Endoscopy;;  . CATARACT EXTRACTION  08/27/12   Right  . COLONOSCOPY    . ESOPHAGOGASTRODUODENOSCOPY (EGD) WITH PROPOFOL N/A 09/20/2018   Procedure: ESOPHAGOGASTRODUODENOSCOPY (EGD) WITH PROPOFOL;  Surgeon: Peter Mayer, MD;  Location: [facility];  Service: Endoscopy;  Laterality: N/A;  . HAND SURGERY  2016   right thumb  . IR IMAGING GUIDED PORT INSERTION  11/04/2018  . KNEE SURGERY Left 1989    FAMILY HISTORY: Family History  Problem Relation Age of Onset  . Prostate cancer Wood        dx >50  . Hypertension Wood   . Ulcers Wood   . Breast cancer Sister        dx 20's  . Prostate cancer Brother        dx under 53, metasttic, cause of his death  . Ulcers Brother   . Ulcers Sister    Peter Wood died from heart complications with a pacemaker at age 55; Peter Wood also has prostate cancer. Peter Wood died from  natural causes at age 30. The patient has 4 brothers and 3 Wood. Peter Wood had breast cancer in her 29s and a brother had prostate cancer. Patient denies anyone in her family having ovarian, or pancreatic cancer. Peter Wood mentions that his youngest sister and his brother with prostate cancer also had stomach ulcers.    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 His wife, Peter Wood, is a retired [employer] summer Radiographer, therapeutic. As of 09/2018, they have been married for 56 years. They have 4 children, Claiborne Billings, [place], [place], and [place]. Claiborne Billings lives in [place] and works at the [employer]. Sherrod lives in [place] and works at [employer] and as a part Horticulturist, commercial. Germane lives in [place] and is a Engineer, drilling for a medical company. Beverely Low lives in [place] and works at [employer]. Samson has 7 grandchildren and 1 step great grandchild. He attends [employer].    ADVANCED DIRECTIVES: His wife, Peter Wood, is automatically his healthcare power of attorney.  HEALTH MAINTENANCE: Social History   Tobacco Use  . Smoking status: Former Smoker    Years: 4.00    Quit date: 09/12/1967    Years since quitting: 52.4  . Smokeless tobacco: Never Used  Vaping Use  . Vaping Use: Never used  Substance Use Topics  . Alcohol use: No  . Drug use: No    Colonoscopy: 2018/[facility]  PSA: 0.54 on 09/18/2018  No Known Allergies  Current Outpatient Medications  Medication Sig Dispense Refill  . amLODipine (NORVASC) 5 MG tablet Take 1 tablet (5 mg total) by mouth daily. 90 tablet 3  . apixaban (ELIQUIS) 2.5 MG TABS tablet Take 1 tablet (2.5 mg total) by mouth 2 (two) times daily. 60 tablet 11  . benazepril (LOTENSIN) 40 MG tablet Take 1 tablet (40 mg total) by mouth daily. 90 tablet 3  . Cholecalciferol 1000 UNITS tablet Take 1,000 Units by mouth daily.     Marland Kitchen dexamethasone (DECADRON) 4 MG tablet Take 2 tablets (8 mg total) by mouth daily. Start the day after  chemotherapy for 2 days. 30 tablet 1  . Elderberry 575 MG/5ML SYRP Take by mouth.    . latanoprost (XALATAN) 0.005 % ophthalmic solution Place 1 drop into both eyes at bedtime.     . lidocaine-prilocaine (EMLA) cream Apply to affected area once 30 g 3  . ondansetron (ZOFRAN) 4 MG tablet Take 1 tablet (4 mg total) by mouth every 8 (eight) hours as needed for nausea or vomiting. 20 tablet 1  . predniSONE (DELTASONE) 5 MG tablet Take 1 tablet (5 mg total) by mouth daily with breakfast. 7 tablet 0   No current facility-administered medications for this visit.   Facility-Administered Medications Ordered in Other Visits  Medication Dose Route Frequency Provider Last Rate Last Admin  . sodium chloride flush (NS) 0.9 % injection 10 mL  10 mL Intravenous PRN Magrinat, Virgie Dad, MD      . sodium chloride flush (NS) 0.9 % injection 10 mL  10 mL Intracatheter PRN Magrinat, Virgie Dad, MD   10 mL at 12/23/19 1238    OBJECTIVE:   Vitals:   02/24/20 1144  BP: 134/80  Pulse: 62  Resp: 17  Temp: 98.7 F (37.1 C)  SpO2: 100%   Wt Readings from Last 3 Encounters:  02/24/20 141 lb 6.4 oz (64.1 kg)  02/03/20 144 lb 11.2 oz (65.6 kg)  01/13/20 146 lb 12.8 oz (66.6 kg)   Body mass index is 23.9 kg/m.    ECOG FS:1 - Symptomatic but completely ambulatory GENERAL: Patient is a well appearing male in no acute distress HEENT:  Sclerae anicteric.  Mask in place.  Neck is supple.  NODES:  No cervical, supraclavicular, or axillary lymphadenopathy palpated.  LUNGS:  Clear to auscultation bilaterally.  No wheezes or rhonchi. HEART:  Regular rate and rhythm. No murmur appreciated. ABDOMEN:  Soft, nontender.  Positive, normoactive bowel sounds. No organomegaly palpated. MSK:  No focal spinal tenderness to palpation. EXTREMITIES:  No peripheral edema.   SKIN:  Clear with no obvious rashes or skin changes.  NEURO:  Nonfocal. Well oriented.  Appropriate affect.    LAB RESULTS:  CMP     Component Value  Date/Time   NA 140 02/24/2020 1137   K 3.5 02/24/2020 1137   CL 103 02/24/2020 1137   CO2 28 02/24/2020 1137   GLUCOSE 95 02/24/2020 1137   GLUCOSE 105 (H) 09/13/2006 0900   BUN 11 02/24/2020 1137   CREATININE 0.96 02/24/2020  1137   CREATININE 1.02 10/16/2018 0936   CALCIUM 9.1 02/24/2020 1137   PROT 6.6 02/24/2020 1137   ALBUMIN 3.1 (L) 02/24/2020 1137   AST 70 (H) 02/24/2020 1137   AST 76 (H) 10/16/2018 0936   ALT 56 (H) 02/24/2020 1137   ALT 47 (H) 10/16/2018 0936   ALKPHOS 278 (H) 02/24/2020 1137   BILITOT 0.7 02/24/2020 1137   BILITOT 0.7 10/16/2018 0936   GFRNONAA >60 02/24/2020 1137   GFRNONAA >60 10/16/2018 0936   GFRAA >60 02/24/2020 1137   GFRAA >60 10/16/2018 0936    No results found for: TOTALPROTELP, ALBUMINELP, A1GS, A2GS, BETS, BETA2SER, GAMS, MSPIKE, SPEI  No results found for: KPAFRELGTCHN, LAMBDASER, KAPLAMBRATIO  Lab Results  Component Value Date   WBC 4.0 02/24/2020   NEUTROABS 1.8 02/24/2020   HGB 11.4 (L) 02/24/2020   HCT 35.0 (L) 02/24/2020   MCV 95.6 02/24/2020   PLT 190 02/24/2020    No results found for: LABCA2  No components found for: XBMWUX324  No results for input(s): INR in the last 168 hours.  No results found for: LABCA2  Lab Results  Component Value Date   CAN199 60 (H) 02/24/2020    No results found for: MWN027  No results found for: OZD664  No results found for: CA2729  No components found for: HGQUANT  Lab Results  Component Value Date   CEA1 107.70 (H) 02/24/2020   /  CEA ([facility])  Date Value Ref Range Status  02/24/2020 107.70 (H) 0.00 - 5.00 ng/mL Final    Comment:    (NOTE) This test was performed using Architect's Chemiluminescent Microparticle Immunoassay. Values obtained from different assay methods cannot be used interchangeably. Please note that 5-10% of patients who smoke may see CEA levels up to 6.9 ng/mL. Performed at [facility], [place] [address],  [place], [place] [address]      No results found for: AFPTUMOR  No results found for: CHROMOGRNA  No results found for: HGBA, HGBA2QUANT, HGBFQUANT, HGBSQUAN (Hemoglobinopathy evaluation)   No results found for: LDH  Lab Results  Component Value Date   IRON 38 (L) 09/21/2018   TIBC 251 09/21/2018   IRONPCTSAT 15 (L) 09/21/2018   (Iron and TIBC)  Lab Results  Component Value Date   FERRITIN 138 09/21/2018    Urinalysis    Component Value Date/Time   COLORURINE YELLOW 02/18/2019 1056   APPEARANCEUR CLEAR 02/18/2019 1056   LABSPEC 1.018 02/18/2019 1056   PHURINE 6.0 02/18/2019 1056   GLUCOSEU NEGATIVE 02/18/2019 1056   GLUCOSEU NEGATIVE 07/20/2017 1056   HGBUR NEGATIVE 02/18/2019 1056   [place] 02/18/2019 [place] 02/18/2019 1056   PROTEINUR 30 (A) 02/18/2019 1056   UROBILINOGEN 0.2 07/20/2017 1056   NITRITE NEGATIVE 02/18/2019 [place] 02/18/2019 1056    STUDIES:  CT Abdomen Pelvis W Contrast  Result Date: 02/26/2020 CLINICAL DATA:  85 year old male with history of gastrointestinal cancer. Evaluate for treatment response. Additional history of prostate cancer diagnosed in 2017. EXAM: CT ABDOMEN AND PELVIS WITH CONTRAST TECHNIQUE: Multidetector CT imaging of the abdomen and pelvis was performed using the standard protocol following bolus administration of intravenous contrast. CONTRAST:  146m OMNIPAQUE IOHEXOL 300 MG/ML  SOLN COMPARISON:  CT the abdomen and pelvis 12/11/2019. FINDINGS: Lower chest: Areas of bronchiectasis and pulmonary fibrosis noted in the visualize lung bases bilaterally. Hepatobiliary: Multiple hypovascular hepatic lesions are again noted. Some of these have clearly decreased in size  compared to the prior examination, most notably a lesion in the central aspect of segments 4A and 4B (axial image 14 of series 2) which currently measures 2.6 x 2.5 cm (previously 3.1 x 3.3 cm). However, other lesions appear  enlarged, most notably, the largest lesion in the right lobe of the liver involving portions of segments 7 and 8 (axial image 11 of series 2) measuring 5.9 x 5.2 cm on today's exam (previously 5.1 x 4.8 cm). A new lesion measuring 9 x 5 mm is also noted in segment 2 (axial image 13 of series 2). No intra or extrahepatic biliary ductal dilatation. Gallbladder is normal in appearance. Pancreas: No pancreatic mass. No pancreatic ductal dilatation. No pancreatic or peripancreatic fluid collections or inflammatory changes. Spleen: Unremarkable. Adrenals/Urinary Tract: Several subcentimeter low-attenuation lesions in both kidneys, too small to characterize, but statistically likely to represent tiny cysts. No other suspicious renal lesions. Bilateral adrenal glands are normal in appearance. No hydroureteronephrosis. Urinary bladder is normal in appearance. Stomach/Bowel: Increasing infiltrative mass-like appearance in the distal body and antrum of the stomach, concerning for progression of the primary gastric neoplasm. This is irregular in shape and therefore difficult to measure. No pathologic dilatation of small bowel or colon. Normal appendix. Vascular/Lymphatic: Aortic atherosclerosis. 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 No lymphadenopathy noted in the abdomen or pelvis. Reproductive: Fiducial markers in the prostate gland. Seminal vesicles are unremarkable in appearance. Other: No significant volume of ascites.  No pneumoperitoneum. Musculoskeletal: There are no aggressive appearing lytic or blastic lesions noted in the visualized portions of the skeleton. IMPRESSION: 1. Today's study does demonstrate a mixed response to therapy. However, while there has been some regression of Peter of the hepatic lesions, the primary gastric lesion and other hepatic lesions have progressed compared to the prior examination. 2. 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 3. Fibrotic changes in the lung bases bilaterally, similar to prior examinations. 4. Additional incidental findings, as above. Electronically Signed   By: Vinnie Langton M.D.   On: 02/26/2020 16:30     ELIGIBLE FOR AVAILABLE RESEARCH PROTOCOL: no   ASSESSMENT: 85 y.o. Peter Wood, Peter Wood status post gastric biopsy 09/20/2018 showing adenocarcinoma, with a CA 19-9 greater than 65,000; staging studies showing an apparent mass adjacent to the head of the pancreas, innumerable liver lesions, upper abdominal mesenteric and peripancreatic lymphadenopathy, and an isolated sclerotic density in the left iliac bone  (a) genomics requested on gastric biopsy showed the tumor to be HER-2 amplified at 3+, PD-L1 positive, and T p53 mutated.  MSI is stable, mismatch repair status is proficient and the tumor mutational burden is intermediate.  (1) chest CT scan 09/19/2018 shows right lower lobe pulmonary embolism  (a) on intravenous heparin started 09/19/2018, transitioned to apixaban 10/01/2018  (2) genetics testing 10/04/2018: negative  (a) family history of prostate and early breast cancer; patient has a history of prostate cancer  (3) started gemcitabine/Abraxane 10/09/2018, repeated days 1, 8 and 15 of each 28-day cycle  (a) stopped after 1 cycle (3 doses) with reassessment of diagnosis based on CARIS results, noted above  (4) started oxaliplatin, capecitabine and trastuzumab 11/06/2018  (a) baseline echocardiogram 09/20/2018 shows an ejection fraction in the 55-60% range  (b) oxaliplatin and trastuzumab every 21 days started 11/06/2018  (c) capecitabine dose 1000 mg twice daily  for 14 days of every 21-day cycle  (d) CT of the abdomen and pelvis 02/11/2019 shows marked improvement in his liver lesions and resolution of periportal adenopathy  (e) tumor markers also show  significant response,  (f) echocardiogram 02/12/2019 shows an ejection fraction in the 60-65% range  (g) CT abd/pelvis show stable/ improved disease, tumor markers nearly normal  (h) oxaliplatin stopped aftwer 04/30/2019 dose  (i) capecitabine stopped after September cycle   (5) trastuzumab maintenance started 05/20/2019, last dose 07/22/2019, with progression  (6) disease progression documented by lab work and CT scan 07/16/2019  (a) resumed FOLFOX 07/29/2019, continuing trastuzumab (every 14 days).  (b) restaging studies after 3 cycles showed evidence of progression (tumor markers equivocal)  (c) FOLFOX discontinued 08/26/2019  (7) started T-DM1 09/30/2019, repeat every 21 days  (a) pembrolizumab added 12/02/2019  (b) echocardiogram 08/25/2019 showed an ejection fraction in the 60-65% range  (c) repeat CT of the abdomen 12/11/2019 shows growth in 2 liver lesions  (8) started [facility] 12/23/2019 at 5.4 mg/kg.  (a) dose increased to 6.4 mg/kg with the third dose 0525 2021  (9) iron deficiency anemia:  (a) Feraheme scheduled for 04/22 /2021 and 01/08/2020    PLAN: Izell [place] met with myself and Dr. Jana Hakim today.  He has clinical signs of cancer progression.  He continues on treatment with Enhertu with good tolerance.  He will continue this every three weeks.  His echocardiogram has remained stable. Ansh's tumor markers have declined some indicating a response to treatment.  His levels today are pending.    Decklyn has had some appetite changes along with itching in his hands.  I sent in prednisone 3m daily to see if that would help. He was also recommended to eat six small meals a day instead of 3 larger meals.  He is drinking boost, and plans to do this more frequently as well.  WBynumwill undergo restaging of his cancer on 02/26/2020.  He will see Dr. MJana Hakimback on 03/17/2020.  He knows to call for any questions or concerns that might develop between now and his next appointment.     Total encounter time 20 minutes.*Wilber Bihari NP 02/28/20 10:28 AM Medical Oncology and Hematology [facility] [address]Tel. [phone]   Fax. [phone]  ADDENDUM: I met with WAnthonymichaelat the time of this visit and subsequently have reviewed his CT scan for restaging.  We are getting a mixed response.  He himself feels a little bit weaker and has lost a little bit of weight.  This is likely due to the treatment and not to the cancer.  Some of the cancer lesions have grown at least Peter has shrunk and his tumor markers are also showing a mixed response. Results for DERSKIN, ZINDA(MRN [id number] as of 02/28/2020 10:29  Ref. Range 12/02/2019 08:54 12/23/2019 08:25 01/13/2020 11:28 02/03/2020 14:05 02/24/2020 11:37  CA 19-9 Latest Ref Range: 0 - 35 U/mL 65 (H) 103 (H) 92 (H) 71 (H) 60 (H)  CEA ([facility]) Latest Ref Range: 0.00 - 5.00 ng/mL 52.86 (H) 63.60 (H) 98.57 (H) 94.36 (H) 107.70 (H)   Assessment is also a complex because I started him at a slightly lower dose of Enhertu given his age.  Since he tolerated it well we have recently just up the dose and he is a little bit symptomatic but still can tolerate it.  The easiest thing I think would be to go  through 2 more doses of Enhertu at the higher dose, increase the support and see if we get a response.  If we do not we can either try to add pembrolizumab or switch to pembrolizumab.  Willis next treatment will be 03/17/2020.  He will see me at that time.  We will discuss all this then.  I will plan to do what ever we need to do to help him get through the next 2 cycles as easily as possible and then restage.  I personally saw this patient and performed a substantive portion of this encounter with the listed APP documented above.   Chauncey Cruel, MD Medical Oncology and Hematology [facility] [address] [place], [place] [address] Tel. [phone]    Fax.  [phone]   *Total Encounter Time as defined by the Centers for Medicare and Medicaid Services includes, in addition to the face-to-face time of a patient visit (documented in the note above) non-face-to-face time: obtaining and reviewing outside history, ordering and reviewing medications, tests or procedures, care coordination (communications with other health care professionals or caregivers) and documentation in the medical record.

## 2020-02-25 ENCOUNTER — Telehealth: Payer: Self-pay | Admitting: Adult Health

## 2020-02-25 LAB — CEA (IN HOUSE-CHCC): CEA (CHCC-In House): 107.7 ng/mL — ABNORMAL HIGH (ref 0.00–5.00)

## 2020-02-25 NOTE — Telephone Encounter (Signed)
No 5/14 los. No changes made to pt's schedule.

## 2020-02-26 ENCOUNTER — Other Ambulatory Visit: Payer: Self-pay

## 2020-02-26 ENCOUNTER — Ambulatory Visit (HOSPITAL_COMMUNITY)
Admission: RE | Admit: 2020-02-26 | Discharge: 2020-02-26 | Disposition: A | Discharge status: Home / Self Care | Payer: Medicare PPO | Source: Ambulatory Visit | Attending: Adult Health | Admitting: Adult Health

## 2020-02-26 DIAGNOSIS — N1330 Unspecified hydronephrosis: Secondary | ICD-10-CM | POA: Diagnosis not present

## 2020-02-26 DIAGNOSIS — C787 Secondary malignant neoplasm of liver and intrahepatic bile duct: Secondary | ICD-10-CM | POA: Insufficient documentation

## 2020-02-26 DIAGNOSIS — C161 Malignant neoplasm of fundus of stomach: Secondary | ICD-10-CM

## 2020-02-26 LAB — CANCER ANTIGEN 19-9: CA 19-9: 60 U/mL — ABNORMAL HIGH (ref 0–35)

## 2020-02-26 MED ORDER — HEPARIN SOD (PORK) LOCK FLUSH 100 UNIT/ML IV SOLN
INTRAVENOUS | Status: AC
  Filled 2020-02-26: qty 5

## 2020-02-26 MED ORDER — IOHEXOL 300 MG/ML  SOLN
100.0000 mL | Freq: Once | INTRAMUSCULAR | Status: AC | PRN
  Administered 2020-02-26: 100 mL via INTRAVENOUS

## 2020-02-26 MED ORDER — HEPARIN SOD (PORK) LOCK FLUSH 100 UNIT/ML IV SOLN
500.0000 [IU] | Freq: Once | INTRAVENOUS | Status: AC
  Administered 2020-02-26: 500 [IU] via INTRAVENOUS

## 2020-02-26 MED ORDER — SODIUM CHLORIDE (PF) 0.9 % IJ SOLN
INTRAMUSCULAR | Status: AC
  Filled 2020-02-26: qty 50

## 2020-02-28 ENCOUNTER — Other Ambulatory Visit: Payer: Self-pay | Admitting: Oncology

## 2020-03-03 ENCOUNTER — Ambulatory Visit (HOSPITAL_COMMUNITY)
Admission: RE | Admit: 2020-03-03 | Discharge: 2020-03-03 | Disposition: A | Discharge status: Home / Self Care | Payer: Medicare PPO | Source: Ambulatory Visit | Attending: Oncology | Admitting: Oncology

## 2020-03-03 ENCOUNTER — Other Ambulatory Visit: Payer: Self-pay

## 2020-03-03 DIAGNOSIS — C799 Secondary malignant neoplasm of unspecified site: Secondary | ICD-10-CM | POA: Insufficient documentation

## 2020-03-03 DIAGNOSIS — R21 Rash and other nonspecific skin eruption: Secondary | ICD-10-CM | POA: Diagnosis not present

## 2020-03-03 DIAGNOSIS — C169 Malignant neoplasm of stomach, unspecified: Secondary | ICD-10-CM | POA: Diagnosis not present

## 2020-03-03 DIAGNOSIS — C160 Malignant neoplasm of cardia: Secondary | ICD-10-CM

## 2020-03-03 DIAGNOSIS — C787 Secondary malignant neoplasm of liver and intrahepatic bile duct: Secondary | ICD-10-CM | POA: Insufficient documentation

## 2020-03-04 ENCOUNTER — Other Ambulatory Visit: Payer: Self-pay | Admitting: Adult Health

## 2020-03-04 ENCOUNTER — Telehealth: Payer: Self-pay | Admitting: *Deleted

## 2020-03-04 DIAGNOSIS — C160 Malignant neoplasm of cardia: Secondary | ICD-10-CM

## 2020-03-04 DIAGNOSIS — C787 Secondary malignant neoplasm of liver and intrahepatic bile duct: Secondary | ICD-10-CM

## 2020-03-04 NOTE — Telephone Encounter (Signed)
This RN returned VM left by pt stating " rash now on my arms " .  Peter Wood states he has been having " small welts " on his hands with itching.- he was prescribed prednisone 5mg  daily last week and used it up until this Tuesday and stopped " because I wasn't itching " ( he took daily for 5 days ).  He has gone ahead an taken a steroid tablet and obtained relief of the itching.  He states rash are small ( smaller then pea ) welts.  Per MD review- pt should take another steroid in the morning and come in for evaluation.  This RN spoke with Lucianne Lei and obtained a time for evaluation- lab appointment with flush entered as well.  This RN spoke with pt per above- verified understanding of recommendation and appts.  He will use benadryl cream to arms if itching recurs this evening.

## 2020-03-05 ENCOUNTER — Inpatient Hospital Stay: Payer: Medicare PPO

## 2020-03-05 ENCOUNTER — Other Ambulatory Visit: Payer: Self-pay

## 2020-03-05 ENCOUNTER — Inpatient Hospital Stay (HOSPITAL_BASED_OUTPATIENT_CLINIC_OR_DEPARTMENT_OTHER): Payer: Medicare PPO | Admitting: Medical

## 2020-03-05 VITALS — BP 138/76 | HR 66 | Temp 97.9°F | Resp 18 | Ht 64.5 in | Wt 141.0 lb

## 2020-03-05 DIAGNOSIS — C787 Secondary malignant neoplasm of liver and intrahepatic bile duct: Secondary | ICD-10-CM | POA: Diagnosis not present

## 2020-03-05 DIAGNOSIS — L509 Urticaria, unspecified: Secondary | ICD-10-CM | POA: Diagnosis not present

## 2020-03-05 DIAGNOSIS — Z79899 Other long term (current) drug therapy: Secondary | ICD-10-CM | POA: Diagnosis not present

## 2020-03-05 DIAGNOSIS — R978 Other abnormal tumor markers: Secondary | ICD-10-CM | POA: Diagnosis not present

## 2020-03-05 DIAGNOSIS — C162 Malignant neoplasm of body of stomach: Secondary | ICD-10-CM | POA: Diagnosis not present

## 2020-03-05 DIAGNOSIS — C169 Malignant neoplasm of stomach, unspecified: Secondary | ICD-10-CM | POA: Diagnosis not present

## 2020-03-05 DIAGNOSIS — Z5112 Encounter for antineoplastic immunotherapy: Secondary | ICD-10-CM | POA: Diagnosis not present

## 2020-03-05 DIAGNOSIS — Z8546 Personal history of malignant neoplasm of prostate: Secondary | ICD-10-CM | POA: Diagnosis not present

## 2020-03-05 MED ORDER — METHYLPREDNISOLONE 4 MG PO TBPK: ORAL_TABLET | ORAL | 0 refills | Status: DC

## 2020-03-05 MED ORDER — TRIAMCINOLONE ACETONIDE 0.1 % EX LOTN: 1.0000 "application " | TOPICAL_LOTION | Freq: Three times a day (TID) | CUTANEOUS | 2 refills | Status: DC

## 2020-03-09 NOTE — Progress Notes (Signed)
[facility] Progress Note   Peter Wood [id number] Jan 15, 1935 85 y.o.  Peter Wood is managed by Dr. Lurline Del  Actively treated with chemotherapy/immunotherapy/hormonal therapy: yes  Current therapy: Enhertu  Last treated:  02/24/2020 (cycle #4, day #1)  Next scheduled appointment with provider: 03/17/2020  Assessment: Plan:    Urticaria - Plan: triamcinolone lotion (KENALOG) 0.1 %, methylPREDNISolone (MEDROL DOSEPAK) 4 MG TBPK tablet  Malignant neoplasm of stomach, unspecified location [facility])  Liver metastases ([place])   Urticaria: The patient was given a prescription for triamcinolone lotion.  Additionally he was given a prescription for a Medrol Dosepak in case his rash should worsen over the weekend.  Metastatic malignant neoplasm of the stomach with liver metastasis: The patient continues to be managed by Dr. Jana Hakim and is status post cycle 4, day 1 of Enhertu which was dosed on 02/24/2020.  He is scheduled to be seen in follow-up on 03/17/2020.  Please see After Visit Summary for patient specific instructions.  Future Appointments  Date Time Provider [place]  03/17/2020 12:15 PM [facility] LAB 3 [facility] None  03/17/2020 12:30 PM [facility] [place] FLUSH [facility] None  03/17/2020  1:00 PM Magrinat, Virgie Dad, MD [facility] None  03/17/2020  1:45 PM [facility] INFUSION [facility] None  03/29/2020  8:00 AM [facility] ECHO OP 1 [facility]  03/29/2020  9:00 AM Bensimhon, Shaune Pascal, MD [facility] None  04/12/2020  1:20 PM Plotnikov, Evie Lacks, MD [facility] None  07/14/2020  9:15 AM [name], MD [facility] None    No orders of the defined types were placed in this encounter.      Subjective:   Patient ID:  Peter Wood is a 85 y.o. (DOB 01-07-1935) male.  Chief Complaint: No chief complaint on file.   HPI Peter Wood  is a 85 y.o. male with a diagnosis of a metastatic malignant neoplasm of the stomach with liver metastasis.   He is followed by Dr. Jana Hakim and is status post cycle 4, day 1 of Enhertu which was dosed on 02/24/2020.  He presents to the clinic today with his wife with a report of a rash over his bilateral medial antecubital fossa of unknown duration.  He denies changes in personal hygiene products.  He denies any change in activity.  He reports that the areas itch.  There are no other areas of rash.  He also reports having some mild anorexia.  He presents to the clinic today with his wife.  Medications: I have reviewed the patient's current medications.  Allergies: No Known Allergies  Past Medical History:  Diagnosis Date  . Arthritis   . Diverticulosis of colon   . Family history of breast cancer   . Family history of prostate cancer   . HTN (hypertension)   . Hx of colonic polyp   . Pneumonia    as a child/baby  . Poor circulation of extremity    right leg  . Prostate cancer [facility]) 2015   prostate cancer - radiation treated    Past Surgical History:  Procedure Laterality Date  . [place] VITRECTOMY WITH 20 GAUGE MVR PORT FOR MACULAR HOLE Right 09/19/2016   Procedure: 25 GAUGE PARS PLANA VITRECTOMY WITH 20 GAUGE MVR PORT FOR MACULAR HOLE, MEMBRANE PEEL, SERUM PATCH, GAS FLUID EXCHANGE, HEADSCOPE LASER;  Surgeon: [name], MD;  Location: [place];  Service: Ophthalmology;  Laterality: Right;  . BIOPSY  09/20/2018   Procedure: BIOPSY;  Surgeon: Carlean Purl,  Ofilia Neas, MD;  Location: [facility] ENDOSCOPY;  Service: Endoscopy;;  . CATARACT EXTRACTION  08/27/12   Right  . COLONOSCOPY    . ESOPHAGOGASTRODUODENOSCOPY (EGD) WITH PROPOFOL N/A 09/20/2018   Procedure: ESOPHAGOGASTRODUODENOSCOPY (EGD) WITH PROPOFOL;  Surgeon: Gatha Mayer, MD;  Location: [place];  Service: Endoscopy;  Laterality: N/A;  . HAND SURGERY  2016   right thumb  . IR IMAGING GUIDED PORT INSERTION  11/04/2018  . KNEE SURGERY Left 1989    Family History  Problem Relation Age of Onset  . Prostate cancer Father         dx >50  . Hypertension Mother   . Ulcers Mother   . Breast cancer Sister        dx 20's  . Prostate cancer Brother        dx under 46, metasttic, cause of his death  . Ulcers Brother   . Ulcers Sister     Social History   Socioeconomic History  . Marital status: Married    Spouse name: Not on file  . Number of children: 4  . Years of education: Not on file  . Highest education level: Not on file  Occupational History  . Occupation: retired  Tobacco Use  . Smoking status: Former Smoker    Years: 4.00    Quit date: 09/12/1967    Years since quitting: 52.5  . Smokeless tobacco: Never Used  Vaping Use  . Vaping Use: Never used  Substance and Sexual Activity  . Alcohol use: No  . Drug use: No  . Sexual activity: Not on file  Other Topics Concern  . Not on file  Social History Narrative  . Not on file   Social Determinants of Health   Financial Resource Strain: Low Risk   . Difficulty of Paying Living Expenses: Not hard at all  Food Insecurity: No Food Insecurity  . Worried About Charity fundraiser in the Last Year: Never true  . Ran Out of Food in the Last Year: Never true  Transportation Needs: No Transportation Needs  . Lack of Transportation (Medical): No  . Lack of Transportation (Non-Medical): No  Physical Activity: Insufficiently Active  . Days of Exercise per Week: 5 days  . Minutes of Exercise per Session: 10 min  Stress: No Stress Concern Present  . Feeling of Stress : Not at all  Social Connections:   . Frequency of Communication with Friends and Family:   . Frequency of Social Gatherings with Friends and Family:   . Attends Religious Services:   . Active Member of Clubs or Organizations:   . Attends Archivist Meetings:   Marland Kitchen Marital Status:   Intimate Partner Violence:   . Fear of Current or Ex-Partner:   . Emotionally Abused:   Marland Kitchen Physically Abused:   . Sexually Abused:     Past Medical History, Surgical history, Social history, and  Family history were reviewed and updated as appropriate.   Please see review of systems for further details on the patient's review from today.   Review of Systems:  Review of Systems  Constitutional: Negative for chills, diaphoresis and fever.  HENT: Negative for facial swelling and trouble swallowing.   Respiratory: Negative for cough, chest tightness and shortness of breath.   Cardiovascular: Negative for chest pain.  Skin: Positive for rash.    Objective:   Physical Exam:  BP 138/76 (BP Location: Left Arm, Patient Position: Sitting)   Pulse 66   Temp  97.9 F (36.6 C) (Temporal)   Resp 18   Ht 5' 4.5" (1.638 m)   Wt 141 lb (64 kg)   SpO2 100%   BMI 23.83 kg/m  ECOG: 0  Physical Exam Constitutional:      General: He is not in acute distress.    Appearance: Normal appearance. He is not ill-appearing, toxic-appearing or diaphoretic.  HENT:     Head: Normocephalic and atraumatic.  Skin:    General: Skin is warm and dry.     Findings: Rash present.     Comments: 2 to 3 raised and erythematous hives over the bilateral medial elbows.  Neurological:     Mental Status: He is alert.     Coordination: Coordination normal.     Gait: Gait normal.  Psychiatric:        Mood and Affect: Mood normal.        Behavior: Behavior normal.        Thought Content: Thought content normal.        Judgment: Judgment normal.     Lab Review:     Component Value Date/Time   NA 140 02/24/2020 1137   K 3.5 02/24/2020 1137   CL 103 02/24/2020 1137   CO2 28 02/24/2020 1137   GLUCOSE 95 02/24/2020 1137   GLUCOSE 105 (H) 09/13/2006 0900   BUN 11 02/24/2020 1137   CREATININE 0.96 02/24/2020 1137   CREATININE 1.02 10/16/2018 0936   CALCIUM 9.1 02/24/2020 1137   PROT 6.6 02/24/2020 1137   ALBUMIN 3.1 (L) 02/24/2020 1137   AST 70 (H) 02/24/2020 1137   AST 76 (H) 10/16/2018 0936   ALT 56 (H) 02/24/2020 1137   ALT 47 (H) 10/16/2018 0936   ALKPHOS 278 (H) 02/24/2020 1137   BILITOT 0.7  02/24/2020 1137   BILITOT 0.7 10/16/2018 0936   GFRNONAA >60 02/24/2020 1137   GFRNONAA >60 10/16/2018 0936   GFRAA >60 02/24/2020 1137   GFRAA >60 10/16/2018 0936       Component Value Date/Time   WBC 4.0 02/24/2020 1137   RBC 3.66 (L) 02/24/2020 1137   HGB 11.4 (L) 02/24/2020 1137   HGB 7.5 (L) 10/16/2018 0936   HCT 35.0 (L) 02/24/2020 1137   PLT 190 02/24/2020 1137   PLT 185 10/16/2018 0936   MCV 95.6 02/24/2020 1137   MCH 31.1 02/24/2020 1137   MCHC 32.6 02/24/2020 1137   RDW 24.3 (H) 02/24/2020 1137   LYMPHSABS 1.5 02/24/2020 1137   MONOABS 0.5 02/24/2020 1137   EOSABS 0.2 02/24/2020 1137   BASOSABS 0.1 02/24/2020 1137   -------------------------------  Imaging from last 24 hours (if applicable):  Radiology interpretation: DG Chest 2 View  Result Date: 03/04/2020 CLINICAL DATA:  85 year old male with gastric cancer. EXAM: CHEST - 2 VIEW COMPARISON:  Chest radiograph dated 07/08/2019. FINDINGS: Right-sided Port-A-Cath with tip over central SVC in similar position. No focal consolidation, pleural effusion or pneumothorax. Stable cardiac silhouette. No acute osseous pathology. IMPRESSION: No active cardiopulmonary disease. Electronically Signed   By: Anner Crete M.D.   On: 03/04/2020 23:08   CT Abdomen Pelvis W Contrast  Result Date: 02/26/2020 CLINICAL DATA:  85 year old male with history of gastrointestinal cancer. Evaluate for treatment response. Additional history of prostate cancer diagnosed in 2017. EXAM: CT ABDOMEN AND PELVIS WITH CONTRAST TECHNIQUE: Multidetector CT imaging of the abdomen and pelvis was performed using the standard protocol following bolus administration of intravenous contrast. CONTRAST:  176mL OMNIPAQUE IOHEXOL 300 MG/ML  SOLN COMPARISON:  CT the abdomen and pelvis 12/11/2019. FINDINGS: Lower chest: Areas of bronchiectasis and pulmonary fibrosis noted in the visualize lung bases bilaterally. Hepatobiliary: Multiple hypovascular hepatic lesions  are again noted. Some of these have clearly decreased in size compared to the prior examination, most notably a lesion in the central aspect of segments 4A and 4B (axial image 14 of series 2) which currently measures 2.6 x 2.5 cm (previously 3.1 x 3.3 cm). However, other lesions appear enlarged, most notably, the largest lesion in the right lobe of the liver involving portions of segments 7 and 8 (axial image 11 of series 2) measuring 5.9 x 5.2 cm on today's exam (previously 5.1 x 4.8 cm). A new lesion measuring 9 x 5 mm is also noted in segment 2 (axial image 13 of series 2). No intra or extrahepatic biliary ductal dilatation. Gallbladder is normal in appearance. Pancreas: No pancreatic mass. No pancreatic ductal dilatation. No pancreatic or peripancreatic fluid collections or inflammatory changes. Spleen: Unremarkable. Adrenals/Urinary Tract: Several subcentimeter low-attenuation lesions in both kidneys, too small to characterize, but statistically likely to represent tiny cysts. No other suspicious renal lesions. Bilateral adrenal glands are normal in appearance. No hydroureteronephrosis. Urinary bladder is normal in appearance. Stomach/Bowel: Increasing infiltrative mass-like appearance in the distal body and antrum of the stomach, concerning for progression of the primary gastric neoplasm. This is irregular in shape and therefore difficult to measure. No pathologic dilatation of small bowel or colon. Normal appendix. Vascular/Lymphatic: Aortic atherosclerosis. 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 No lymphadenopathy noted in the abdomen or pelvis. Reproductive: Fiducial markers in the prostate gland. Seminal vesicles are unremarkable in appearance. Other: No significant volume of ascites.  No pneumoperitoneum. Musculoskeletal: There are no aggressive appearing lytic or blastic lesions noted in the visualized portions  of the skeleton. IMPRESSION: 1. Today's study does demonstrate a mixed response to therapy. However, while there has been some regression of one of the hepatic lesions, the primary gastric lesion and other hepatic lesions have progressed compared to the prior examination. 2. 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 3. Fibrotic changes in the lung bases bilaterally, similar to prior examinations. 4. Additional incidental findings, as above. Electronically Signed   By: Vinnie Langton M.D.   On: 02/26/2020 16:30

## 2020-03-17 ENCOUNTER — Inpatient Hospital Stay: Payer: Medicare PPO

## 2020-03-17 ENCOUNTER — Inpatient Hospital Stay (HOSPITAL_BASED_OUTPATIENT_CLINIC_OR_DEPARTMENT_OTHER): Payer: Medicare PPO | Admitting: Oncology

## 2020-03-17 ENCOUNTER — Inpatient Hospital Stay: Payer: Medicare PPO | Attending: Oncology

## 2020-03-17 ENCOUNTER — Other Ambulatory Visit: Payer: Self-pay

## 2020-03-17 VITALS — BP 142/70 | HR 64 | Temp 98.5°F | Resp 18 | Ht 64.5 in | Wt 141.4 lb

## 2020-03-17 DIAGNOSIS — C61 Malignant neoplasm of prostate: Secondary | ICD-10-CM

## 2020-03-17 DIAGNOSIS — C787 Secondary malignant neoplasm of liver and intrahepatic bile duct: Secondary | ICD-10-CM | POA: Insufficient documentation

## 2020-03-17 DIAGNOSIS — C7951 Secondary malignant neoplasm of bone: Secondary | ICD-10-CM | POA: Diagnosis not present

## 2020-03-17 DIAGNOSIS — C160 Malignant neoplasm of cardia: Secondary | ICD-10-CM

## 2020-03-17 DIAGNOSIS — Z79899 Other long term (current) drug therapy: Secondary | ICD-10-CM | POA: Diagnosis not present

## 2020-03-17 DIAGNOSIS — C799 Secondary malignant neoplasm of unspecified site: Secondary | ICD-10-CM

## 2020-03-17 DIAGNOSIS — C168 Malignant neoplasm of overlapping sites of stomach: Secondary | ICD-10-CM

## 2020-03-17 DIAGNOSIS — Z5112 Encounter for antineoplastic immunotherapy: Secondary | ICD-10-CM | POA: Diagnosis not present

## 2020-03-17 DIAGNOSIS — Z7189 Other specified counseling: Secondary | ICD-10-CM

## 2020-03-17 DIAGNOSIS — C169 Malignant neoplasm of stomach, unspecified: Secondary | ICD-10-CM

## 2020-03-17 DIAGNOSIS — C162 Malignant neoplasm of body of stomach: Secondary | ICD-10-CM | POA: Insufficient documentation

## 2020-03-17 DIAGNOSIS — D509 Iron deficiency anemia, unspecified: Secondary | ICD-10-CM | POA: Diagnosis not present

## 2020-03-17 DIAGNOSIS — Z95828 Presence of other vascular implants and grafts: Secondary | ICD-10-CM

## 2020-03-17 DIAGNOSIS — D649 Anemia, unspecified: Secondary | ICD-10-CM

## 2020-03-17 LAB — COMPREHENSIVE METABOLIC PANEL
ALT: 55 U/L — ABNORMAL HIGH (ref 0–44)
AST: 69 U/L — ABNORMAL HIGH (ref 15–41)
Albumin: 3.1 g/dL — ABNORMAL LOW (ref 3.5–5.0)
Alkaline Phosphatase: 274 U/L — ABNORMAL HIGH (ref 38–126)
Anion gap: 9 (ref 5–15)
BUN: 15 mg/dL (ref 8–23)
CO2: 27 mmol/L (ref 22–32)
Calcium: 9.1 mg/dL (ref 8.9–10.3)
Chloride: 103 mmol/L (ref 98–111)
Creatinine, Ser: 1.15 mg/dL (ref 0.61–1.24)
GFR calc Af Amer: >60 mL/min (ref >60)
GFR calc non Af Amer: 59 mL/min — ABNORMAL LOW (ref >60)
Glucose, Bld: 105 mg/dL — ABNORMAL HIGH (ref 70–99)
Potassium: 3.8 mmol/L (ref 3.5–5.1)
Sodium: 139 mmol/L (ref 135–145)
Total Bilirubin: 0.7 mg/dL (ref 0.3–1.2)
Total Protein: 6.6 g/dL (ref 6.5–8.1)

## 2020-03-17 LAB — CBC WITH DIFFERENTIAL/PLATELET
Abs Immature Granulocytes: 0.02 10*3/uL (ref 0.00–0.07)
Basophils Absolute: 0 10*3/uL (ref 0.0–0.1)
Basophils Relative: 1 %
Eosinophils Absolute: 0.2 10*3/uL (ref 0.0–0.5)
Eosinophils Relative: 5 %
HCT: 34.1 % — ABNORMAL LOW (ref 39.0–52.0)
Hemoglobin: 11.1 g/dL — ABNORMAL LOW (ref 13.0–17.0)
Immature Granulocytes: 1 %
Lymphocytes Relative: 30 %
Lymphs Abs: 1.3 10*3/uL (ref 0.7–4.0)
MCH: 33.3 pg (ref 26.0–34.0)
MCHC: 32.6 g/dL (ref 30.0–36.0)
MCV: 102.4 fL — ABNORMAL HIGH (ref 80.0–100.0)
Monocytes Absolute: 0.5 10*3/uL (ref 0.1–1.0)
Monocytes Relative: 11 %
Neutro Abs: 2.2 10*3/uL (ref 1.7–7.7)
Neutrophils Relative %: 52 %
Platelets: 169 10*3/uL (ref 150–400)
RBC: 3.33 MIL/uL — ABNORMAL LOW (ref 4.22–5.81)
RDW: 18.1 % — ABNORMAL HIGH (ref 11.5–15.5)
WBC: 4.2 10*3/uL (ref 4.0–10.5)
nRBC: 0 % (ref 0.0–0.2)

## 2020-03-17 LAB — TSH: TSH: 0.438 u[IU]/mL (ref 0.320–4.118)

## 2020-03-17 LAB — CEA (IN HOUSE-CHCC): CEA (CHCC-In House): 121.91 ng/mL — ABNORMAL HIGH (ref 0.00–5.00)

## 2020-03-17 MED ORDER — PALONOSETRON HCL INJECTION 0.25 MG/5ML
0.2500 mg | Freq: Once | INTRAVENOUS | Status: AC
  Administered 2020-03-17: 0.25 mg via INTRAVENOUS

## 2020-03-17 MED ORDER — DIPHENHYDRAMINE HCL 25 MG PO CAPS
ORAL_CAPSULE | ORAL | Status: AC
  Filled 2020-03-17: qty 1

## 2020-03-17 MED ORDER — SODIUM CHLORIDE 0.9% FLUSH
10.0000 mL | INTRAVENOUS | Status: DC | PRN
  Administered 2020-03-17: 10 mL
  Filled 2020-03-17: qty 10

## 2020-03-17 MED ORDER — DIPHENHYDRAMINE HCL 25 MG PO CAPS
25.0000 mg | ORAL_CAPSULE | Freq: Once | ORAL | Status: AC
  Administered 2020-03-17: 25 mg via ORAL

## 2020-03-17 MED ORDER — ACETAMINOPHEN 325 MG PO TABS
650.0000 mg | ORAL_TABLET | Freq: Once | ORAL | Status: AC
  Administered 2020-03-17: 650 mg via ORAL

## 2020-03-17 MED ORDER — DEXTROSE 5 % IV SOLN
Freq: Once | INTRAVENOUS | Status: AC
  Filled 2020-03-17: qty 250

## 2020-03-17 MED ORDER — FAM-TRASTUZUMAB DERUXTECAN-NXKI CHEMO 100 MG IV SOLR
6.1500 mg/kg | Freq: Once | INTRAVENOUS | Status: AC
  Administered 2020-03-17: 400 mg via INTRAVENOUS
  Filled 2020-03-17: qty 20

## 2020-03-17 MED ORDER — PALONOSETRON HCL INJECTION 0.25 MG/5ML
INTRAVENOUS | Status: AC
  Filled 2020-03-17: qty 5

## 2020-03-17 MED ORDER — SODIUM CHLORIDE 0.9% FLUSH
10.0000 mL | Freq: Once | INTRAVENOUS | Status: AC
  Administered 2020-03-17: 10 mL
  Filled 2020-03-17: qty 10

## 2020-03-17 MED ORDER — SODIUM CHLORIDE 0.9 % IV SOLN
10.0000 mg | Freq: Once | INTRAVENOUS | Status: AC
  Administered 2020-03-17: 10 mg via INTRAVENOUS
  Filled 2020-03-17: qty 10

## 2020-03-17 MED ORDER — ACETAMINOPHEN 325 MG PO TABS
ORAL_TABLET | ORAL | Status: AC
  Filled 2020-03-17: qty 2

## 2020-03-17 MED ORDER — HEPARIN SOD (PORK) LOCK FLUSH 100 UNIT/ML IV SOLN
500.0000 [IU] | Freq: Once | INTRAVENOUS | Status: AC | PRN
  Administered 2020-03-17: 500 [IU]
  Filled 2020-03-17: qty 5

## 2020-03-17 NOTE — Patient Instructions (Addendum)
[place] Discharge Instructions for Patients Receiving Chemotherapy  Today you received the following immunotherapy agent: ENHERTU  To help prevent nausea and vomiting after your treatment, we encourage you to take your nausea medication as directed by your MD.   If you develop nausea and vomiting that is not controlled by your nausea medication, call the clinic.   BELOW ARE SYMPTOMS THAT SHOULD BE REPORTED IMMEDIATELY:  *FEVER GREATER THAN 100.5 F  *CHILLS WITH OR WITHOUT FEVER  NAUSEA AND VOMITING THAT IS NOT CONTROLLED WITH YOUR NAUSEA MEDICATION  *UNUSUAL SHORTNESS OF BREATH  *UNUSUAL BRUISING OR BLEEDING  TENDERNESS IN MOUTH AND THROAT WITH OR WITHOUT PRESENCE OF ULCERS  *URINARY PROBLEMS  *BOWEL PROBLEMS  UNUSUAL RASH Items with * indicate a potential emergency and should be followed up as soon as possible.  Feel free to call the clinic should you have any questions or concerns. The clinic phone number is [phone].  Please show the [place] at check-in to the Emergency Department and triage nurse.

## 2020-03-17 NOTE — Addendum Note (Signed)
Addended by: Chauncey Cruel on: 03/17/2020 02:01 PM   Modules accepted: Orders

## 2020-03-17 NOTE — Progress Notes (Signed)
[facility]  [phone] [phone]     ID: Peter Wood DOB: 04-Oct-1934  MR#: [id number]  AQT#:[id number]  Patient Care Team: Cassandria Anger, MD as PCP - General Milus Banister, MD as Attending Physician (Gastroenterology) Magrinat, Virgie Dad, MD as Consulting Physician (Oncology) Gatha Mayer, MD as Consulting Physician (Gastroenterology) [name], MD as Consulting Physician (Ophthalmology) Ramon Dredge, MD as Referring Physician (Radiation Oncology) Larey Dresser, MD as Consulting Physician (Cardiology) OTHER MD:   CHIEF COMPLAINT: Metastatic gastric adenocarcinoma  CURRENT TREATMENT: Enhertu   INTERVAL HISTORY: Peter Wood returns today for follow-up and treatment of his metastatic gastric adenocarcinoma.   He was started on Enhertu on 12/23/2019. He receives this every three weeks and is tolerating this well.  His first several treatments were at a slightly lower dose.  We increased his dose slightly with the last treatment and are bringing it up to a full dose as of this time since he is tolerating it moderately well  His most recent echocardiogram on 12/29/2019 and showed an LVEF of 55-60%.    Since his last visit, he underwent restaging CT abdomen/pelvis on 02/26/2020 showing a mixed response to therapy: some regression of one hepatic lesion; progression of primary gastric lesion and other hepatic lesions.  He also underwent chest x-ray on 03/03/2020 showing no active cardiopulmonary disease.   REVIEW OF SYSTEMS: Peter Wood feels "all right".  He is taking naps after lunch.  He sleeps well at night.  He is not as energetic as before.  Some days he has little appetite.  His weight however is stable.  He does have some taste perversion, but no nausea.  He has developed a rash.  He is using triamcinolone for that.  He developed a mouth sore which has cleared.  Aside from these issues a detailed review of systems today was  stable.  HISTORY OF CURRENT ILLNESS: From the original intake note:  Peter Wood presented to the emergency room 09/07/2018 with mid/upper abdominal pain.  He reported weight loss of 25 pounds over the past 6 months and significant anorexia.  His liver function tests were abnormal and a right upper quadrant ultrasound was obtained.  This was read as concerning for malignancy and a CT of the abdomen and pelvis with contrast was obtained the same day, showing  1. Too numerous to count hypodense masses of the liver concerning for hepatic metastasis. In the region of the porta hepatis, there are hypodense lesions more likely associated with the liver though the pancreatic head is partially obscured by a 3.2 cm hypodense mass. Findings are more likely associated with the liver and less likely an isolated pancreatic mass. 2. Upper abdominal mesenteric and peripancreatic lymphadenopathy suspicious for metastatic disease. 3. 6 mm sclerotic density in the left iliac bone adjacent to the SI joint. Given history of prostate cancer, differential considerations would include osteoblastic metastasis or possibly bone island. Showed only diverticular disease.  He was referred to GI and Dr. Ardis Hughs notes a colonoscopy performed 04/23/2017 makes the possibility of colon cancer unlikely.  Further work-up was planned, but on 09/19/2018 the patient again presented to the emergency room with chest pain, now in a different area, and a staging CT scan of the chest showed filling defects in the right lower lobe pulmonary arteries consistent with embolism.  There was a small pericardial effusion and an additional 2.4 cm left thyroid mass.  Aortic atherosclerosis was noted  He was started on  intravenous heparin and on 09/20/2018 underwent upper endoscopy under Dr. Carlean Purl showing a giant nonbleeding cratered gastric ulcer in the posterior wall of the stomach.  Biopsies of this procedure showed (SZA 20-187) adenocarcinoma.    At that point we were consulted and we obtained tumor markers which included a CEA of 1318.2, a CA 19-9 of 67,479, a normal AFP and a normal PSA.  I discussed the case with pathology and they do not feel that additional testing would be helpful in terms of discriminating between a primary gastric and a primary biliary/pancreatic tumor.  The patient's subsequent history is as detailed below.   PAST MEDICAL HISTORY: Past Medical History:  Diagnosis Date  . Arthritis   . Diverticulosis of colon   . Family history of breast cancer   . Family history of prostate cancer   . HTN (hypertension)   . Hx of colonic polyp   . Pneumonia    as a child/baby  . Poor circulation of extremity    right leg  . Prostate cancer ([place]) 2015   prostate cancer - radiation treated    PAST SURGICAL HISTORY: Past Surgical History:  Procedure Laterality Date  . [place] VITRECTOMY WITH 20 GAUGE MVR PORT FOR MACULAR HOLE Right 09/19/2016   Procedure: 25 GAUGE PARS PLANA VITRECTOMY WITH 20 GAUGE MVR PORT FOR MACULAR HOLE, MEMBRANE PEEL, SERUM PATCH, GAS FLUID EXCHANGE, HEADSCOPE LASER;  Surgeon: [name], MD;  Location: [facility];  Service: Ophthalmology;  Laterality: Right;  . BIOPSY  09/20/2018   Procedure: BIOPSY;  Surgeon: Gatha Mayer, MD;  Location: [facility] ENDOSCOPY;  Service: Endoscopy;;  . CATARACT EXTRACTION  08/27/12   Right  . COLONOSCOPY    . ESOPHAGOGASTRODUODENOSCOPY (EGD) WITH PROPOFOL N/A 09/20/2018   Procedure: ESOPHAGOGASTRODUODENOSCOPY (EGD) WITH PROPOFOL;  Surgeon: Gatha Mayer, MD;  Location: [facility];  Service: Endoscopy;  Laterality: N/A;  . HAND SURGERY  2016   right thumb  . IR IMAGING GUIDED PORT INSERTION  11/04/2018  . KNEE SURGERY Left 1989    FAMILY HISTORY: Family History  Problem Relation Age of Onset  . Prostate cancer Father        dx >50  . Hypertension Mother   . Ulcers Mother   . Breast cancer Sister        dx 20's  . Prostate cancer Brother         dx under 54, metasttic, cause of his death  . Ulcers Brother   . Ulcers Sister    [name]'s father died from heart complications with a pacemaker at age 41; [name]'s father also has prostate cancer. Patients' mother died from natural causes at age 69. The patient has 4 brothers and 3 sisters. One of [place] sisters had breast cancer in her 45s and a brother had prostate cancer. Patient denies anyone in her family having ovarian, or pancreatic cancer. Darick mentions that his youngest sister and his brother with prostate cancer also had stomach ulcers.    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 His wife, [name], is a retired [employer] summer Radiographer, therapeutic. As of 09/2018, they have been married for 56 years. They have 4 children, Claiborne Billings, [place], [place], and [place]. Claiborne Billings lives in [place] and works at the [employer]. Sherrod lives in [place] and works at [employer] and as a part Horticulturist, commercial. Germane lives in [place] and is a Engineer, drilling for a medical company. Beverely Low lives in [place] and works at  [employer]. Saverio has 7 grandchildren and 1 step great grandchild. He attends [employer].    ADVANCED DIRECTIVES: His wife, [name], is automatically his healthcare power of attorney.     HEALTH MAINTENANCE: Social History   Tobacco Use  . Smoking status: Former Smoker    Years: 4.00    Quit date: 09/12/1967    Years since quitting: 52.5  . Smokeless tobacco: Never Used  Vaping Use  . Vaping Use: Never used  Substance Use Topics  . Alcohol use: No  . Drug use: No    Colonoscopy: 2018/[facility]  PSA: 0.54 on 09/18/2018  No Known Allergies  Current Outpatient Medications  Medication Sig Dispense Refill  . amLODipine (NORVASC) 5 MG tablet Take 1 tablet (5 mg total) by mouth daily. 90 tablet 3  . apixaban (ELIQUIS) 2.5 MG TABS tablet Take 1 tablet (2.5 mg total) by mouth 2 (two) times daily. 60 tablet 11  . benazepril (LOTENSIN) 40 MG  tablet Take 1 tablet (40 mg total) by mouth daily. 90 tablet 3  . Cholecalciferol 1000 UNITS tablet Take 1,000 Units by mouth daily.     Marland Kitchen dexamethasone (DECADRON) 4 MG tablet Take 2 tablets (8 mg total) by mouth daily. Start the day after chemotherapy for 2 days. 30 tablet 1  . Elderberry 575 MG/5ML SYRP Take by mouth.    . latanoprost (XALATAN) 0.005 % ophthalmic solution Place 1 drop into both eyes at bedtime.     . lidocaine-prilocaine (EMLA) cream Apply to affected area once 30 g 3  . methylPREDNISolone (MEDROL DOSEPAK) 4 MG TBPK tablet 6 x 1 day, 5 x 1 day, 4 x 1 day, 3 x 1 day, 2 x 1 day, 1 x 1 day 21 tablet 0  . ondansetron (ZOFRAN) 4 MG tablet Take 1 tablet (4 mg total) by mouth every 8 (eight) hours as needed for nausea or vomiting. 20 tablet 1  . predniSONE (DELTASONE) 5 MG tablet Take 1 tablet by mouth daily with breakfast 7 tablet 0  . triamcinolone lotion (KENALOG) 0.1 % Apply 1 application topically 3 (three) times daily. 60 mL 2   No current facility-administered medications for this visit.   Facility-Administered Medications Ordered in Other Visits  Medication Dose Route Frequency Provider Last Rate Last Admin  . sodium chloride flush (NS) 0.9 % injection 10 mL  10 mL Intravenous PRN Dian Laprade, Virgie Dad, MD      . sodium chloride flush (NS) 0.9 % injection 10 mL  10 mL Intracatheter PRN Kweku Stankey, Virgie Dad, MD   10 mL at 12/23/19 1238    OBJECTIVE: African-American man who appears stated age  34:   03/17/20 1251  BP: (!) 142/70  Pulse: 64  Resp: 18  Temp: 98.5 F (36.9 C)  SpO2: 100%   Wt Readings from Last 3 Encounters:  03/17/20 141 lb 6.4 oz (64.1 kg)  03/05/20 141 lb (64 kg)  02/24/20 141 lb 6.4 oz (64.1 kg)   Body mass index is 23.9 kg/m.    ECOG FS:1 - Symptomatic but completely ambulatory  Sclerae unicteric, EOMs intact Wearing a mask No cervical or supraclavicular adenopathy Lungs no rales or rhonchi Heart regular rate and rhythm Abd soft,  nontender, positive bowel sounds MSK no focal spinal tenderness, no upper extremity lymphedema, acrall hyperpigmentation Neuro: nonfocal, well oriented, appropriate affect   LAB RESULTS:  CMP     Component Value Date/Time   NA 140 02/24/2020 1137   K 3.5 02/24/2020 1137  CL 103 02/24/2020 1137   CO2 28 02/24/2020 1137   GLUCOSE 95 02/24/2020 1137   GLUCOSE 105 (H) 09/13/2006 0900   BUN 11 02/24/2020 1137   CREATININE 0.96 02/24/2020 1137   CREATININE 1.02 10/16/2018 0936   CALCIUM 9.1 02/24/2020 1137   PROT 6.6 02/24/2020 1137   ALBUMIN 3.1 (L) 02/24/2020 1137   AST 70 (H) 02/24/2020 1137   AST 76 (H) 10/16/2018 0936   ALT 56 (H) 02/24/2020 1137   ALT 47 (H) 10/16/2018 0936   ALKPHOS 278 (H) 02/24/2020 1137   BILITOT 0.7 02/24/2020 1137   BILITOT 0.7 10/16/2018 0936   GFRNONAA >60 02/24/2020 1137   GFRNONAA >60 10/16/2018 0936   GFRAA >60 02/24/2020 1137   GFRAA >60 10/16/2018 0936    No results found for: TOTALPROTELP, ALBUMINELP, A1GS, A2GS, BETS, BETA2SER, GAMS, MSPIKE, SPEI  No results found for: KPAFRELGTCHN, LAMBDASER, KAPLAMBRATIO  Lab Results  Component Value Date   WBC 4.2 03/17/2020   NEUTROABS 2.2 03/17/2020   HGB 11.1 (L) 03/17/2020   HCT 34.1 (L) 03/17/2020   MCV 102.4 (H) 03/17/2020   PLT 169 03/17/2020    No results found for: LABCA2  No components found for: [id number]  No results for input(s): INR in the last 168 hours.  No results found for: LABCA2  Lab Results  Component Value Date   CAN199 60 (H) 02/24/2020    No results found for: WUJ811  No results found for: BJY782  No results found for: CA2729  No components found for: HGQUANT  Lab Results  Component Value Date   CEA1 107.70 (H) 02/24/2020   /  CEA ([facility])  Date Value Ref Range Status  02/24/2020 107.70 (H) 0.00 - 5.00 ng/mL Final    Comment:    (NOTE) This test was performed using Architect's Chemiluminescent Microparticle Immunoassay. Values obtained  from different assay methods cannot be used interchangeably. Please note that 5-10% of patients who smoke may see CEA levels up to 6.9 ng/mL. Performed at [facility], [place] [address], [place], [place] [address]      No results found for: AFPTUMOR  No results found for: CHROMOGRNA  No results found for: HGBA, HGBA2QUANT, HGBFQUANT, HGBSQUAN (Hemoglobinopathy evaluation)   No results found for: LDH  Lab Results  Component Value Date   IRON 38 (L) 09/21/2018   TIBC 251 09/21/2018   IRONPCTSAT 15 (L) 09/21/2018   (Iron and TIBC)  Lab Results  Component Value Date   FERRITIN 138 09/21/2018    Urinalysis    Component Value Date/Time   COLORURINE YELLOW 02/18/2019 1056   APPEARANCEUR CLEAR 02/18/2019 1056   LABSPEC 1.018 02/18/2019 1056   PHURINE 6.0 02/18/2019 1056   GLUCOSEU NEGATIVE 02/18/2019 1056   GLUCOSEU NEGATIVE 07/20/2017 1056   HGBUR NEGATIVE 02/18/2019 1056   [place] 02/18/2019 1056   [place] 02/18/2019 1056   PROTEINUR 30 (A) 02/18/2019 1056   UROBILINOGEN 0.2 07/20/2017 1056   NITRITE NEGATIVE 02/18/2019 [place] 02/18/2019 1056    STUDIES:  DG Chest 2 View  Result Date: 03/04/2020 CLINICAL DATA:  85 year old male with gastric cancer. EXAM: CHEST - 2 VIEW COMPARISON:  Chest radiograph dated 07/08/2019. FINDINGS: Right-sided Port-A-Cath with tip over central SVC in similar position. No focal consolidation, pleural effusion or pneumothorax. Stable cardiac silhouette. No acute osseous pathology. IMPRESSION: No active cardiopulmonary disease. Electronically Signed   By: Anner Crete M.D.   On: 03/04/2020 23:08  CT Abdomen Pelvis W Contrast  Result Date: 02/26/2020 CLINICAL DATA:  85 year old male with history of gastrointestinal cancer. Evaluate for treatment response. Additional history of prostate cancer diagnosed in 2017. EXAM: CT ABDOMEN AND PELVIS WITH CONTRAST TECHNIQUE:  Multidetector CT imaging of the abdomen and pelvis was performed using the standard protocol following bolus administration of intravenous contrast. CONTRAST:  17m OMNIPAQUE IOHEXOL 300 MG/ML  SOLN COMPARISON:  CT the abdomen and pelvis 12/11/2019. FINDINGS: Lower chest: Areas of bronchiectasis and pulmonary fibrosis noted in the visualize lung bases bilaterally. Hepatobiliary: Multiple hypovascular hepatic lesions are again noted. Some of these have clearly decreased in size compared to the prior examination, most notably a lesion in the central aspect of segments 4A and 4B (axial image 14 of series 2) which currently measures 2.6 x 2.5 cm (previously 3.1 x 3.3 cm). However, other lesions appear enlarged, most notably, the largest lesion in the right lobe of the liver involving portions of segments 7 and 8 (axial image 11 of series 2) measuring 5.9 x 5.2 cm on today's exam (previously 5.1 x 4.8 cm). A new lesion measuring 9 x 5 mm is also noted in segment 2 (axial image 13 of series 2). No intra or extrahepatic biliary ductal dilatation. Gallbladder is normal in appearance. Pancreas: No pancreatic mass. No pancreatic ductal dilatation. No pancreatic or peripancreatic fluid collections or inflammatory changes. Spleen: Unremarkable. Adrenals/Urinary Tract: Several subcentimeter low-attenuation lesions in both kidneys, too small to characterize, but statistically likely to represent tiny cysts. No other suspicious renal lesions. Bilateral adrenal glands are normal in appearance. No hydroureteronephrosis. Urinary bladder is normal in appearance. Stomach/Bowel: Increasing infiltrative mass-like appearance in the distal body and antrum of the stomach, concerning for progression of the primary gastric neoplasm. This is irregular in shape and therefore difficult to measure. No pathologic dilatation of small bowel or colon. Normal appendix. Vascular/Lymphatic: Aortic atherosclerosis. 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 No lymphadenopathy noted in the abdomen or pelvis. Reproductive: Fiducial markers in the prostate gland. Seminal vesicles are unremarkable in appearance. Other: No significant volume of ascites.  No pneumoperitoneum. Musculoskeletal: There are no aggressive appearing lytic or blastic lesions noted in the visualized portions of the skeleton. IMPRESSION: 1. Today's study does demonstrate a mixed response to therapy. However, while there has been some regression of one of the hepatic lesions, the primary gastric lesion and other hepatic lesions have progressed compared to the prior examination. 2. 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 3. Fibrotic changes in the lung bases bilaterally, similar to prior examinations. 4. Additional incidental findings, as above. Electronically Signed   By: DVinnie LangtonM.D.   On: 02/26/2020 16:30     ELIGIBLE FOR AVAILABLE RESEARCH PROTOCOL: no   ASSESSMENT: 85[place]. [place] status post gastric biopsy 09/20/2018 showing adenocarcinoma, with a CA 19-9 greater than 65,000; staging studies showing an apparent mass adjacent to the head of the pancreas, innumerable liver lesions, upper abdominal mesenteric and peripancreatic lymphadenopathy, and an isolated sclerotic density in the left iliac bone  (a) genomics requested on gastric biopsy showed the tumor to be HER-2 amplified at 3+, PD-L1 positive, and T p53 mutated.  MSI is stable, mismatch repair status is proficient and the tumor mutational burden is intermediate.  (1) chest CT scan 09/19/2018 shows right lower lobe  pulmonary embolism  (a) on intravenous heparin started 09/19/2018, transitioned to apixaban 10/01/2018  (2) genetics testing 10/04/2018: negative  (a) family history of prostate and early breast cancer; patient  has a history of prostate cancer  (3) started gemcitabine/Abraxane 10/09/2018, repeated days 1, 8 and 15 of each 28-day cycle  (a) stopped after 1 cycle (3 doses) with reassessment of diagnosis based on CARIS results, noted above  (4) started oxaliplatin, capecitabine and trastuzumab 11/06/2018  (a) baseline echocardiogram 09/20/2018 shows an ejection fraction in the 55-60% range  (b) oxaliplatin and trastuzumab every 21 days started 11/06/2018  (c) capecitabine dose 1000 mg twice daily for 14 days of every 21-day cycle  (d) CT of the abdomen and pelvis 02/11/2019 shows marked improvement in his liver lesions and resolution of periportal adenopathy  (e) tumor markers also show significant response,  (f) echocardiogram 02/12/2019 shows an ejection fraction in the 60-65% range  (g) CT abd/pelvis show stable/ improved disease, tumor markers nearly normal  (h) oxaliplatin stopped aftwer 04/30/2019 dose  (i) capecitabine stopped after September cycle   (5) trastuzumab maintenance started 05/20/2019, last dose 07/22/2019, with progression  (6) disease progression documented by lab work and CT scan 07/16/2019  (a) resumed FOLFOX 07/29/2019, continuing trastuzumab (every 14 days).  (b) restaging studies after 3 cycles showed evidence of progression (tumor markers equivocal)  (c) FOLFOX discontinued 08/26/2019  (7) started T-DM1 09/30/2019, repeat every 21 days  (a) pembrolizumab added 12/02/2019  (b) echocardiogram 08/25/2019 showed an ejection fraction in the 60-65% range  (c) repeat CT of the abdomen 12/11/2019 shows growth in 2 liver lesions  (8) started [facility] 12/23/2019 at 5.4 mg/kg.  (a) dose increased to 6.1 mg/kg with the third dose and two 6.4 (target dose) 03/17/2020  (9) iron deficiency anemia:  (a) Feraheme scheduled for 04/22 /2021 and 01/08/2020    PLAN: I reviewed results of the scan with Peter Wood and his wife.  It shows a mixed response.  The tumor marker similarly are  mixed, with the CA 19 going down, the CEA bouncing around a little bit slightly up.  We could switch to Taxol/Cyramza q. 14 days, switch to pembrolizumab alone, or continue what we are doing.  Since this will only be his fourth dose of Enhertu, I think it is worthwhile continuing, especially as now he will be full dose.  We will do this treatment and 1 in 3 weeks and then he will be restaged with a repeat CT of the abdomen.  If we see evidence of clear response we will continue but otherwise we will switch to the Taxol/Cyramza combination.  I would reserve the Keytruda alone for final treatment before opting for palliative care  Peter Wood has a good understanding of this plan.  He agrees with it.  They know to call for any other issue that may develop before the next visit.  Total encounter time 35 minutes.Sarajane Jews C. Magrinat, MD 03/17/20 1:12 PM Medical Oncology and Hematology [facility] [place], [place] [address] Tel. [phone]    Fax. [phone]   I, Wilburn Mylar, am acting as scribe for Dr. Virgie Dad. Magrinat.  I, Lurline Del MD, have reviewed the above documentation for accuracy and completeness, and I agree with the above.    *Total Encounter Time as defined by the Centers for Medicare and Medicaid Services includes, in addition to the face-to-face time of a patient visit (documented in the note above) non-face-to-face time: obtaining and reviewing outside  history, ordering and reviewing medications, tests or procedures, care coordination (communications with other health care professionals or caregivers) and documentation in the medical record.

## 2020-03-18 LAB — CANCER ANTIGEN 19-9: CA 19-9: 65 U/mL — ABNORMAL HIGH (ref 0–35)

## 2020-03-19 ENCOUNTER — Telehealth: Payer: Self-pay | Admitting: *Deleted

## 2020-03-19 ENCOUNTER — Telehealth: Payer: Self-pay | Admitting: Oncology

## 2020-03-19 NOTE — Telephone Encounter (Signed)
Scheduled appts per 7/7 los. Pt confirmed appt date and time.

## 2020-03-19 NOTE — Telephone Encounter (Signed)
This RN spoke with pt per his call stating onset of indigestion this am and is inquiring what he can take.  This RN recommended Pepcid bid.  Amiere verbalized understanding of above.

## 2020-03-29 ENCOUNTER — Ambulatory Visit (HOSPITAL_COMMUNITY)
Admission: RE | Admit: 2020-03-29 | Discharge: 2020-03-29 | Disposition: A | Discharge status: Home / Self Care | Payer: Medicare PPO | Source: Ambulatory Visit | Attending: Internal Medicine | Admitting: Internal Medicine

## 2020-03-29 ENCOUNTER — Encounter (HOSPITAL_COMMUNITY): Payer: Self-pay | Admitting: Internal Medicine

## 2020-03-29 ENCOUNTER — Other Ambulatory Visit: Payer: Self-pay

## 2020-03-29 ENCOUNTER — Ambulatory Visit (HOSPITAL_BASED_OUTPATIENT_CLINIC_OR_DEPARTMENT_OTHER)
Admission: RE | Admit: 2020-03-29 | Discharge: 2020-03-29 | Disposition: A | Discharge status: Home / Self Care | Payer: Medicare PPO | Source: Ambulatory Visit | Attending: Internal Medicine | Admitting: Internal Medicine

## 2020-03-29 VITALS — BP 160/98 | HR 60 | Wt 137.0 lb

## 2020-03-29 DIAGNOSIS — Z803 Family history of malignant neoplasm of breast: Secondary | ICD-10-CM | POA: Diagnosis not present

## 2020-03-29 DIAGNOSIS — Z7901 Long term (current) use of anticoagulants: Secondary | ICD-10-CM | POA: Insufficient documentation

## 2020-03-29 DIAGNOSIS — Z79899 Other long term (current) drug therapy: Secondary | ICD-10-CM | POA: Diagnosis not present

## 2020-03-29 DIAGNOSIS — I10 Essential (primary) hypertension: Secondary | ICD-10-CM | POA: Diagnosis not present

## 2020-03-29 DIAGNOSIS — C799 Secondary malignant neoplasm of unspecified site: Secondary | ICD-10-CM

## 2020-03-29 DIAGNOSIS — C169 Malignant neoplasm of stomach, unspecified: Secondary | ICD-10-CM | POA: Diagnosis not present

## 2020-03-29 DIAGNOSIS — Z8546 Personal history of malignant neoplasm of prostate: Secondary | ICD-10-CM | POA: Diagnosis not present

## 2020-03-29 DIAGNOSIS — I358 Other nonrheumatic aortic valve disorders: Secondary | ICD-10-CM

## 2020-03-29 DIAGNOSIS — Z87891 Personal history of nicotine dependence: Secondary | ICD-10-CM | POA: Diagnosis not present

## 2020-03-29 DIAGNOSIS — M1990 Unspecified osteoarthritis, unspecified site: Secondary | ICD-10-CM | POA: Insufficient documentation

## 2020-03-29 DIAGNOSIS — I517 Cardiomegaly: Secondary | ICD-10-CM | POA: Diagnosis not present

## 2020-03-29 DIAGNOSIS — C162 Malignant neoplasm of body of stomach: Secondary | ICD-10-CM | POA: Diagnosis not present

## 2020-03-29 LAB — ECHOCARDIOGRAM COMPLETE
Area-P 1/2: 2.91 cm2
Calc EF: 47.8 %
S' Lateral: 3.8 cm
Single Plane A2C EF: 53 %
Single Plane A4C EF: 45.6 %

## 2020-03-29 NOTE — Patient Instructions (Signed)
It was great to see you today! No medication changes are needed at this time.  Your physician has requested that you have an echocardiogram. Echocardiography is a painless test that uses sound waves to create images of your heart. It provides your doctor with information about the size and shape of your heart and how well your heart's chambers and valves are working. This procedure takes approximately one hour. There are no restrictions for this procedure.   Your physician recommends that you schedule a follow-up appointment in: 3-4 months with Dr Haroldine Laws  Do the following things EVERYDAY: 1) Weigh yourself in the morning before breakfast. Write it down and keep it in a log. 2) Take your medicines as prescribed 3) Eat low salt foods--Limit salt (sodium) to 2000 mg per day.  4) Stay as active as you can everyday 5) Limit all fluids for the day to less than 2 liters

## 2020-03-29 NOTE — Progress Notes (Signed)
  Echocardiogram 2D Echocardiogram has been performed.  Jennette Dubin 03/29/2020, 8:59 AM

## 2020-03-29 NOTE — Progress Notes (Addendum)
CARDIO-ONCOLOGY CLINIC NOTE  Referring Physician: Dr. Jana Wood   HPI:  [name] is 85 y.o. male with metastatic gastric adenoCA referred by Dr. Doris Wood for enrollment into the Cardio-Oncology program for cardiac surveillance while receiving Enhertu immunotherapy.   Has h/o HTN and prostate CA. Denies any h/o known heart disease. Active with no CP or SOB.   Diagnosed with gastric adenoCA in 1/20.   Treatment course:  (1) started gemcitabine/Abraxane 10/09/2018, repeated days 1, 8 and 15 of each 28-day cycle             (a) stopped after 1 cycle (3 doses) with reassessment of diagnosis based on CARIS results, noted above  (2) started oxaliplatin, capecitabine and trastuzumab 11/06/2018             (a) baseline echocardiogram 09/20/2018 shows an ejection fraction in the 55-60% range             (b) oxaliplatin and trastuzumab every 21 days started 11/06/2018             (c) capecitabine dose 1000 mg twice daily for 14 days of every 21-day cycle             (d) CT of the abdomen and pelvis 02/11/2019 shows marked improvement in his liver lesions and resolution of periportal adenopathy             (e) tumor markers also show significant response,             (f) echocardiogram 02/12/2019 shows an ejection fraction in the 60-65% range             (g) CT abd/pelvis show stable/ improved disease, tumor markers nearly normal             (h) oxaliplatin stopped aftwer 04/30/2019 dose             (i) capecitabine stopped after September cycle              (3) trastuzumab maintenance started 05/20/2019   Restaging studies in 12/20 showed marked progression of disease and changed to Enhertu. Restaging CT abdomen/pelvis on 02/26/2020 showing a mixed response to therapy: some regression of one hepatic lesion; progression of primary gastric lesion and other hepatic lesions.Got first done Peter Wood 12/23/19. Tolerating ok. Gets nausea for several days then goes away. Energy and appetite not so  good.SBPs typically 130-140 range. No SOB or edema.   Echo today 03/29/20 EF 50-55% GLS -16.8.          ECHO: 08/25/19 EF 60-65%  Echo 4/21 EF 55-60%  GLS - 19.5%     Past Medical History:  Diagnosis Date  . Arthritis   . Diverticulosis of colon   . Family history of breast cancer   . Family history of prostate cancer   . HTN (hypertension)   . Hx of colonic polyp   . Pneumonia    as a child/baby  . Poor circulation of extremity    right leg  . Prostate cancer Peter Wood) 2015   prostate cancer - radiation treated    Current Outpatient Medications  Medication Sig Dispense Refill  . amLODipine (NORVASC) 5 MG tablet Take 1 tablet (5 mg total) by mouth daily. 90 tablet 3  . apixaban (ELIQUIS) 2.5 MG TABS tablet Take 1 tablet (2.5 mg total) by mouth 2 (two) times daily. 60 tablet 11  . benazepril (LOTENSIN) 40 MG tablet Take 1 tablet (40 mg total) by mouth daily. 90 tablet 3  .  Cholecalciferol 1000 UNITS tablet Take 1,000 Units by mouth daily.     Marland Kitchen dexamethasone (DECADRON) 4 MG tablet Take 2 tablets (8 mg total) by mouth daily. Start the day after chemotherapy for 2 days. 30 tablet 1  . Elderberry 575 MG/5ML SYRP Take by mouth.    . latanoprost (XALATAN) 0.005 % ophthalmic solution Place 1 drop into both eyes at bedtime.     . lidocaine-prilocaine (EMLA) cream Apply to affected area once 30 g 3  . ondansetron (ZOFRAN) 4 MG tablet Take 1 tablet (4 mg total) by mouth every 8 (eight) hours as needed for nausea or vomiting. 20 tablet 1  . triamcinolone lotion (KENALOG) 0.1 % Apply 1 application topically 3 (three) times daily. 60 mL 2  . predniSONE (DELTASONE) 5 MG tablet Take 1 tablet by mouth daily with breakfast (Patient not taking: Reported on 03/29/2020) 7 tablet 0   No current facility-administered medications for this encounter.   Facility-Administered Medications Ordered in Other Encounters  Medication Dose Route Frequency Provider Last Rate Last Admin  . sodium chloride flush  (NS) 0.9 % injection 10 mL  10 mL Intravenous PRN Peter Wood, Peter Dad, MD      . sodium chloride flush (NS) 0.9 % injection 10 mL  10 mL Intracatheter PRN Peter Wood, Peter Dad, MD   10 mL at 12/23/19 1238    No Known Allergies    Social History   Socioeconomic History  . Marital status: Married    Spouse name: Not on file  . Number of children: 4  . Years of education: Not on file  . Highest education level: Not on file  Occupational History  . Occupation: retired  Tobacco Use  . Smoking status: Former Smoker    Years: 4.00    Quit date: 09/12/1967    Years since quitting: 52.5  . Smokeless tobacco: Never Used  Vaping Use  . Vaping Use: Never used  Substance and Sexual Activity  . Alcohol use: No  . Drug use: No  . Sexual activity: Not on file  Other Topics Concern  . Not on file  Social History Narrative  . Not on file   Social Determinants of Health   Financial Resource Strain: Low Risk   . Difficulty of Paying Living Expenses: Not hard at all  Food Insecurity: No Food Insecurity  . Worried About Charity fundraiser in the Last Year: Never true  . Ran Out of Food in the Last Year: Never true  Transportation Needs: No Transportation Needs  . Lack of Transportation (Medical): No  . Lack of Transportation (Non-Medical): No  Physical Activity: Insufficiently Active  . Days of Exercise per Week: 5 days  . Minutes of Exercise per Session: 10 min  Stress: No Stress Concern Present  . Feeling of Stress : Not at all  Social Connections:   . Frequency of Communication with Friends and Family:   . Frequency of Social Gatherings with Friends and Family:   . Attends Religious Services:   . Active Member of Clubs or Organizations:   . Attends Archivist Meetings:   Marland Kitchen Marital Status:   Intimate Partner Violence:   . Fear of Current or Ex-Partner:   . Emotionally Abused:   Marland Kitchen Physically Abused:   . Sexually Abused:       Family History  Problem Relation Age of  Onset  . Prostate cancer Father        dx >50  . Hypertension Mother   .  Ulcers Mother   . Breast cancer Sister        dx 20's  . Prostate cancer Brother        dx under 33, metasttic, cause of his death  . Ulcers Brother   . Ulcers Sister     Vitals:   03/29/20 0853  BP: (!) 160/98  Pulse: 60  SpO2: 98%  Weight: 62.1 kg (137 lb)    PHYSICAL EXAM: General:  Well appearing. No resp difficulty HEENT: normal Neck: supple. no JVD. Carotids 2+ bilat; no bruits. No lymphadenopathy or thryomegaly appreciated. Cor: PMI nondisplaced. Regular rate & rhythm. No rubs, gallops or murmurs. Lungs: clear Abdomen: soft, nontender, mildly distended. No hepatosplenomegaly. No bruits or masses. Good bowel sounds. Extremities: no cyanosis, clubbing, rash, edema Neuro: alert & orientedx3, cranial nerves grossly intact. moves all 4 extremities w/o difficulty. Affect pleasant    ASSESSMENT & PLAN:  1. Metastatic gastric adenoCA - has tolerated Herceptin well with out any cardiac toxicity. Echo 12/20 reviewed and EF 60% - no s/p 1st dose Enhertu 12/23/19 - Echo 12/29/19 EF 55-60%  GLS - 19.5% (stable)  - Echo today 03/29/20 EF 50-55% GLS -16.8. EF slight down from previous but not markedly so. Will continue Enhertu and repeat echo in 2-3 months. Continue benazepril. HR too low for b-blocker.  - Dr. Jana Wood will see him next week and decide on whether or not to change his regimen based on tumor response. Any therapy ok from a cardiac standpoint currently as options limited - echo 2-3 months  2. HTN - BP again high here but says he did not take meds this am say BP usually well controlled - In reviewing vitals from [place] typical BP remains 130-140s/80s. Borderline control but ok for age - If SBP persistently above 140 would consider increasing amlodipine to 10 or add low-dose spiro 12.5 to reduce risk of cardio-toxicity  Glori Bickers, MD  8:57 AM

## 2020-04-06 ENCOUNTER — Encounter: Payer: Self-pay | Admitting: Internal Medicine

## 2020-04-06 DIAGNOSIS — Z961 Presence of intraocular lens: Secondary | ICD-10-CM | POA: Diagnosis not present

## 2020-04-06 DIAGNOSIS — H401111 Primary open-angle glaucoma, right eye, mild stage: Secondary | ICD-10-CM | POA: Diagnosis not present

## 2020-04-06 DIAGNOSIS — H2512 Age-related nuclear cataract, left eye: Secondary | ICD-10-CM | POA: Diagnosis not present

## 2020-04-06 DIAGNOSIS — H40022 Open angle with borderline findings, high risk, left eye: Secondary | ICD-10-CM | POA: Diagnosis not present

## 2020-04-08 ENCOUNTER — Encounter: Payer: Self-pay | Admitting: Oncology

## 2020-04-08 ENCOUNTER — Inpatient Hospital Stay: Payer: Medicare PPO

## 2020-04-08 ENCOUNTER — Other Ambulatory Visit: Payer: Self-pay

## 2020-04-08 ENCOUNTER — Inpatient Hospital Stay (HOSPITAL_BASED_OUTPATIENT_CLINIC_OR_DEPARTMENT_OTHER): Payer: Medicare PPO | Admitting: Adult Health

## 2020-04-08 ENCOUNTER — Encounter: Payer: Self-pay | Admitting: Adult Health

## 2020-04-08 VITALS — BP 121/68 | HR 93 | Temp 98.5°F | Resp 18 | Ht 64.5 in | Wt 138.1 lb

## 2020-04-08 DIAGNOSIS — C787 Secondary malignant neoplasm of liver and intrahepatic bile duct: Secondary | ICD-10-CM

## 2020-04-08 DIAGNOSIS — C168 Malignant neoplasm of overlapping sites of stomach: Secondary | ICD-10-CM

## 2020-04-08 DIAGNOSIS — C162 Malignant neoplasm of body of stomach: Secondary | ICD-10-CM | POA: Diagnosis not present

## 2020-04-08 DIAGNOSIS — C799 Secondary malignant neoplasm of unspecified site: Secondary | ICD-10-CM

## 2020-04-08 DIAGNOSIS — Z5112 Encounter for antineoplastic immunotherapy: Secondary | ICD-10-CM | POA: Diagnosis not present

## 2020-04-08 DIAGNOSIS — Z95828 Presence of other vascular implants and grafts: Secondary | ICD-10-CM

## 2020-04-08 DIAGNOSIS — D649 Anemia, unspecified: Secondary | ICD-10-CM

## 2020-04-08 DIAGNOSIS — C61 Malignant neoplasm of prostate: Secondary | ICD-10-CM

## 2020-04-08 DIAGNOSIS — Z7189 Other specified counseling: Secondary | ICD-10-CM

## 2020-04-08 DIAGNOSIS — D509 Iron deficiency anemia, unspecified: Secondary | ICD-10-CM | POA: Diagnosis not present

## 2020-04-08 DIAGNOSIS — Z79899 Other long term (current) drug therapy: Secondary | ICD-10-CM | POA: Diagnosis not present

## 2020-04-08 DIAGNOSIS — C7951 Secondary malignant neoplasm of bone: Secondary | ICD-10-CM | POA: Diagnosis not present

## 2020-04-08 LAB — TSH: TSH: 0.334 u[IU]/mL (ref 0.320–4.118)

## 2020-04-08 LAB — CBC WITH DIFFERENTIAL/PLATELET
Abs Immature Granulocytes: 0.03 10*3/uL (ref 0.00–0.07)
Basophils Absolute: 0.1 10*3/uL (ref 0.0–0.1)
Basophils Relative: 1 %
Eosinophils Absolute: 0.1 10*3/uL (ref 0.0–0.5)
Eosinophils Relative: 3 %
HCT: 32 % — ABNORMAL LOW (ref 39.0–52.0)
Hemoglobin: 10.5 g/dL — ABNORMAL LOW (ref 13.0–17.0)
Immature Granulocytes: 1 %
Lymphocytes Relative: 31 %
Lymphs Abs: 1.4 10*3/uL (ref 0.7–4.0)
MCH: 34.3 pg — ABNORMAL HIGH (ref 26.0–34.0)
MCHC: 32.8 g/dL (ref 30.0–36.0)
MCV: 104.6 fL — ABNORMAL HIGH (ref 80.0–100.0)
Monocytes Absolute: 0.8 10*3/uL (ref 0.1–1.0)
Monocytes Relative: 17 %
Neutro Abs: 2.2 10*3/uL (ref 1.7–7.7)
Neutrophils Relative %: 47 %
Platelets: 167 10*3/uL (ref 150–400)
RBC: 3.06 MIL/uL — ABNORMAL LOW (ref 4.22–5.81)
RDW: 14.7 % (ref 11.5–15.5)
WBC: 4.7 10*3/uL (ref 4.0–10.5)
nRBC: 0 % (ref 0.0–0.2)

## 2020-04-08 LAB — COMPREHENSIVE METABOLIC PANEL
ALT: 45 U/L — ABNORMAL HIGH (ref 0–44)
AST: 65 U/L — ABNORMAL HIGH (ref 15–41)
Albumin: 2.9 g/dL — ABNORMAL LOW (ref 3.5–5.0)
Alkaline Phosphatase: 318 U/L — ABNORMAL HIGH (ref 38–126)
Anion gap: 10 (ref 5–15)
BUN: 12 mg/dL (ref 8–23)
CO2: 26 mmol/L (ref 22–32)
Calcium: 9.3 mg/dL (ref 8.9–10.3)
Chloride: 104 mmol/L (ref 98–111)
Creatinine, Ser: 0.92 mg/dL (ref 0.61–1.24)
GFR calc Af Amer: >60 mL/min (ref >60)
GFR calc non Af Amer: >60 mL/min (ref >60)
Glucose, Bld: 121 mg/dL — ABNORMAL HIGH (ref 70–99)
Potassium: 3.5 mmol/L (ref 3.5–5.1)
Sodium: 140 mmol/L (ref 135–145)
Total Bilirubin: 1 mg/dL (ref 0.3–1.2)
Total Protein: 6.3 g/dL — ABNORMAL LOW (ref 6.5–8.1)

## 2020-04-08 LAB — CEA (IN HOUSE-CHCC): CEA (CHCC-In House): 143.33 ng/mL — ABNORMAL HIGH (ref 0.00–5.00)

## 2020-04-08 MED ORDER — FAM-TRASTUZUMAB DERUXTECAN-NXKI CHEMO 100 MG IV SOLR
6.1500 mg/kg | Freq: Once | INTRAVENOUS | Status: AC
  Administered 2020-04-08: 400 mg via INTRAVENOUS
  Filled 2020-04-08: qty 20

## 2020-04-08 MED ORDER — HEPARIN SOD (PORK) LOCK FLUSH 100 UNIT/ML IV SOLN
500.0000 [IU] | Freq: Once | INTRAVENOUS | Status: AC | PRN
  Administered 2020-04-08: 500 [IU]
  Filled 2020-04-08: qty 5

## 2020-04-08 MED ORDER — ACETAMINOPHEN 325 MG PO TABS
ORAL_TABLET | ORAL | Status: AC
  Filled 2020-04-08: qty 2

## 2020-04-08 MED ORDER — SODIUM CHLORIDE 0.9% FLUSH
10.0000 mL | INTRAVENOUS | Status: DC | PRN
  Administered 2020-04-08: 10 mL
  Filled 2020-04-08: qty 10

## 2020-04-08 MED ORDER — DIPHENHYDRAMINE HCL 25 MG PO CAPS
25.0000 mg | ORAL_CAPSULE | Freq: Once | ORAL | Status: AC
  Administered 2020-04-08: 25 mg via ORAL

## 2020-04-08 MED ORDER — SODIUM CHLORIDE 0.9 % IV SOLN
10.0000 mg | Freq: Once | INTRAVENOUS | Status: AC
  Administered 2020-04-08: 10 mg via INTRAVENOUS
  Filled 2020-04-08: qty 10

## 2020-04-08 MED ORDER — SODIUM CHLORIDE 0.9% FLUSH
10.0000 mL | Freq: Once | INTRAVENOUS | Status: AC
  Administered 2020-04-08: 10 mL
  Filled 2020-04-08: qty 10

## 2020-04-08 MED ORDER — DEXTROSE 5 % IV SOLN
Freq: Once | INTRAVENOUS | Status: AC
  Filled 2020-04-08: qty 250

## 2020-04-08 MED ORDER — PALONOSETRON HCL INJECTION 0.25 MG/5ML
INTRAVENOUS | Status: AC
  Filled 2020-04-08: qty 5

## 2020-04-08 MED ORDER — PALONOSETRON HCL INJECTION 0.25 MG/5ML
0.2500 mg | Freq: Once | INTRAVENOUS | Status: AC
  Administered 2020-04-08: 0.25 mg via INTRAVENOUS

## 2020-04-08 MED ORDER — DIPHENHYDRAMINE HCL 25 MG PO CAPS
ORAL_CAPSULE | ORAL | Status: AC
  Filled 2020-04-08: qty 1

## 2020-04-08 MED ORDER — ACETAMINOPHEN 325 MG PO TABS
650.0000 mg | ORAL_TABLET | Freq: Once | ORAL | Status: AC
  Administered 2020-04-08: 650 mg via ORAL

## 2020-04-08 NOTE — Progress Notes (Signed)
[facility]  [phone] [phone]     ID: KAYSIN BROCK DOB: 05-09-1935  MR#: [id number]  BMW#:[id number]  Patient Care Team: Cassandria Anger, MD as PCP - General Milus Banister, MD as Attending Physician (Gastroenterology) Magrinat, Virgie Dad, MD as Consulting Physician (Oncology) Gatha Mayer, MD as Consulting Physician (Gastroenterology) [name], MD as Consulting Physician (Ophthalmology) Ramon Dredge, MD as Referring Physician (Radiation Oncology) Larey Dresser, MD as Consulting Physician (Cardiology) OTHER MD:   CHIEF COMPLAINT: Metastatic gastric adenocarcinoma  CURRENT TREATMENT: Enhertu   INTERVAL HISTORY: Von returns today for follow-up and treatment of his metastatic gastric adenocarcinoma.   He was started on Enhertu on 12/23/2019. He receives this every three weeks and is tolerating this well.  He did have restaging of the CT abdomen and pelvis on 02/26/2020 that showed a mixed response to treatment.  He met with Dr. Jana Hakim to review these results and they decided to do 2 more cycles and restage.   Today he is here for his second dose of Enhertu.  He is due for restaging in a couple of weeks for repeat CT scan.    His most recent echocardiogram was completed on 03/29/2020 and showed an EF of 50-55%.  He was cleared to continue treatment by Dr. Haroldine Laws.     REVIEW OF SYSTEMS: Kendry notes that he is feeling moderately well today.  He notes a decrease in his oral intake, particularly in the evening.  He also notes some increased fatigue.  He denies any new pain.  He has no bowel or bladder concerns.  He is without fever, chills, chest pain, palpitations, cough, shortness of breath, headaches, vision issues or any other concerns.  A detailed ROS Was otherwise non contributory.   HISTORY OF CURRENT ILLNESS: From the original intake note:  ATTILA MCCARTHY presented to the emergency room 09/07/2018  with mid/upper abdominal pain.  He reported weight loss of 25 pounds over the past 6 months and significant anorexia.  His liver function tests were abnormal and a right upper quadrant ultrasound was obtained.  This was read as concerning for malignancy and a CT of the abdomen and pelvis with contrast was obtained the same day, showing  1. Too numerous to count hypodense masses of the liver concerning for hepatic metastasis. In the region of the porta hepatis, there are hypodense lesions more likely associated with the liver though the pancreatic head is partially obscured by a 3.2 cm hypodense mass. Findings are more likely associated with the liver and less likely an isolated pancreatic mass. 2. Upper abdominal mesenteric and peripancreatic lymphadenopathy suspicious for metastatic disease. 3. 6 mm sclerotic density in the left iliac bone adjacent to the SI joint. Given history of prostate cancer, differential considerations would include osteoblastic metastasis or possibly bone island. Showed only diverticular disease.  He was referred to GI and Dr. Ardis Hughs notes a colonoscopy performed 04/23/2017 makes the possibility of colon cancer unlikely.  Further work-up was planned, but on 09/19/2018 the patient again presented to the emergency room with chest pain, now in a different area, and a staging CT scan of the chest showed filling defects in the right lower lobe pulmonary arteries consistent with embolism.  There was a small pericardial effusion and an additional 2.4 cm left thyroid mass.  Aortic atherosclerosis was noted  He was started on intravenous heparin and on 09/20/2018 underwent upper endoscopy under Dr. Carlean Purl showing a giant nonbleeding cratered gastric  ulcer in the posterior wall of the stomach.  Biopsies of this procedure showed (SZA 20-187) adenocarcinoma.   At that point we were consulted and we obtained tumor markers which included a CEA of 1318.2, a CA 19-9 of 67,479, a normal AFP and a  normal PSA.  I discussed the case with pathology and they do not feel that additional testing would be helpful in terms of discriminating between a primary gastric and a primary biliary/pancreatic tumor.  The patient's subsequent history is as detailed below.   PAST MEDICAL HISTORY: Past Medical History:  Diagnosis Date   Arthritis    Diverticulosis of colon    Family history of breast cancer    Family history of prostate cancer    HTN (hypertension)    Hx of colonic polyp    Pneumonia    as a child/baby   Poor circulation of extremity    right leg   Prostate cancer ([facility]) 2015   prostate cancer - radiation treated    PAST SURGICAL HISTORY: Past Surgical History:  Procedure Laterality Date   25 GAUGE PARS PLANA VITRECTOMY WITH 20 GAUGE MVR PORT FOR MACULAR HOLE Right 09/19/2016   Procedure: 25 GAUGE PARS PLANA VITRECTOMY WITH 20 GAUGE MVR PORT FOR MACULAR HOLE, MEMBRANE PEEL, SERUM PATCH, GAS FLUID EXCHANGE, HEADSCOPE LASER;  Surgeon: [name], MD;  Location: [facility];  Service: Ophthalmology;  Laterality: Right;   BIOPSY  09/20/2018   Procedure: BIOPSY;  Surgeon: Gatha Mayer, MD;  Location: [facility];  Service: Endoscopy;;   CATARACT EXTRACTION  08/27/12   Right   COLONOSCOPY     ESOPHAGOGASTRODUODENOSCOPY (EGD) WITH PROPOFOL N/A 09/20/2018   Procedure: ESOPHAGOGASTRODUODENOSCOPY (EGD) WITH PROPOFOL;  Surgeon: Gatha Mayer, MD;  Location: [facility];  Service: Endoscopy;  Laterality: N/A;   HAND SURGERY  2016   right thumb   IR IMAGING GUIDED PORT INSERTION  11/04/2018   KNEE SURGERY Left 1989    FAMILY HISTORY: Family History  Problem Relation Age of Onset   Prostate cancer Father        dx >50   Hypertension Mother    Ulcers Mother    Breast cancer Sister        dx 20's   Prostate cancer Brother        dx under 6, metasttic, cause of his death   Ulcers Brother    Ulcers Sister    Carlson's father died from heart  complications with a pacemaker at age 20; Tim's father also has prostate cancer. Patients' mother died from natural causes at age 46. The patient has 4 brothers and 3 sisters. One of Doland sisters had breast cancer in her 58s and a brother had prostate cancer. Patient denies anyone in her family having ovarian, or pancreatic cancer. Shyheim mentions that his youngest sister and his brother with prostate cancer also had stomach ulcers.    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 His wife, [name], is a retired [employer] summer Radiographer, therapeutic. As of 09/2018, they have been married for 56 years. They have 4 children, Claiborne Billings, [place], [place], and [place]. Claiborne Billings lives in [place] and works at the [employer]. Sherrod lives in [place] and works at [employer] and as a part Horticulturist, commercial. Germane lives in [place] and is a Engineer, drilling for a medical company. Beverely Low lives in [place] and works at [employer]. Britten has 7 grandchildren and 1 step great grandchild. He attends [employer].  ADVANCED DIRECTIVES: His wife, [name], is automatically his healthcare power of attorney.     HEALTH MAINTENANCE: Social History   Tobacco Use   Smoking status: Former Smoker    Years: 4.00    Quit date: 09/12/1967    Years since quitting: 52.6   Smokeless tobacco: Never Used  Vaping Use   Vaping Use: Never used  Substance Use Topics   Alcohol use: No   Drug use: No    Colonoscopy: 2018/[facility]  PSA: 0.54 on 09/18/2018  No Known Allergies  Current Outpatient Medications  Medication Sig Dispense Refill   amLODipine (NORVASC) 5 MG tablet Take 1 tablet (5 mg total) by mouth daily. 90 tablet 3   apixaban (ELIQUIS) 2.5 MG TABS tablet Take 1 tablet (2.5 mg total) by mouth 2 (two) times daily. 60 tablet 11   benazepril (LOTENSIN) 40 MG tablet Take 1 tablet (40 mg total) by mouth daily. 90 tablet 3   Cholecalciferol 1000 UNITS tablet Take 1,000 Units by mouth  daily.      latanoprost (XALATAN) 0.005 % ophthalmic solution Place 1 drop into both eyes at bedtime.      lidocaine-prilocaine (EMLA) cream Apply to affected area once 30 g 3   ondansetron (ZOFRAN) 4 MG tablet Take 1 tablet (4 mg total) by mouth every 8 (eight) hours as needed for nausea or vomiting. 20 tablet 1   predniSONE (DELTASONE) 5 MG tablet Take 1 tablet by mouth daily with breakfast (Patient not taking: Reported on 03/29/2020) 7 tablet 0   triamcinolone lotion (KENALOG) 0.1 % Apply 1 application topically 3 (three) times daily. 60 mL 2   No current facility-administered medications for this visit.   Facility-Administered Medications Ordered in Other Visits  Medication Dose Route Frequency Provider Last Rate Last Admin   sodium chloride flush (NS) 0.9 % injection 10 mL  10 mL Intravenous PRN Magrinat, Virgie Dad, MD       sodium chloride flush (NS) 0.9 % injection 10 mL  10 mL Intracatheter PRN Magrinat, Virgie Dad, MD   10 mL at 12/23/19 1238    OBJECTIVE:   Vitals:   04/08/20 1332  BP: 121/68  Pulse: 93  Resp: 18  Temp: 98.5 F (36.9 C)  SpO2: 100%   Wt Readings from Last 3 Encounters:  04/08/20 138 lb 1.6 oz (62.6 kg)  03/29/20 137 lb (62.1 kg)  03/17/20 141 lb 6.4 oz (64.1 kg)   Body mass index is 23.34 kg/m.    ECOG FS:1 - Symptomatic but completely ambulatory GENERAL: Patient is a well appearing male in no acute distress HEENT:  Sclerae anicteric.  Mask in place Neck is supple.  NODES:  No cervical, supraclavicular, or axillary lymphadenopathy palpated. LUNGS:  Clear to auscultation bilaterally.  No wheezes or rhonchi. HEART:  Regular rate and rhythm. No murmur appreciated. ABDOMEN:  Soft, nontender.  Positive, normoactive bowel sounds. No organomegaly palpated. MSK:  No focal spinal tenderness to palpation EXTREMITIES:  No peripheral edema.   SKIN:  Clear with no obvious rashes or skin changes. No nail dyscrasia. NEURO:  Nonfocal. Well oriented.   Appropriate affect.    LAB RESULTS:  CMP     Component Value Date/Time   NA 139 03/17/2020 1223   K 3.8 03/17/2020 1223   CL 103 03/17/2020 1223   CO2 27 03/17/2020 1223   GLUCOSE 105 (H) 03/17/2020 1223   GLUCOSE 105 (H) 09/13/2006 0900   BUN 15 03/17/2020 1223   CREATININE 1.15 03/17/2020 1223  CREATININE 1.02 10/16/2018 0936   CALCIUM 9.1 03/17/2020 1223   PROT 6.6 03/17/2020 1223   ALBUMIN 3.1 (L) 03/17/2020 1223   AST 69 (H) 03/17/2020 1223   AST 76 (H) 10/16/2018 0936   ALT 55 (H) 03/17/2020 1223   ALT 47 (H) 10/16/2018 0936   ALKPHOS 274 (H) 03/17/2020 1223   BILITOT 0.7 03/17/2020 1223   BILITOT 0.7 10/16/2018 0936   GFRNONAA 59 (L) 03/17/2020 1223   GFRNONAA >60 10/16/2018 0936   GFRAA >60 03/17/2020 1223   GFRAA >60 10/16/2018 0936    No results found for: TOTALPROTELP, ALBUMINELP, A1GS, A2GS, BETS, BETA2SER, GAMS, MSPIKE, SPEI  No results found for: KPAFRELGTCHN, LAMBDASER, KAPLAMBRATIO  Lab Results  Component Value Date   WBC 4.7 04/08/2020   NEUTROABS 2.2 04/08/2020   HGB 10.5 (L) 04/08/2020   HCT 32.0 (L) 04/08/2020   MCV 104.6 (H) 04/08/2020   PLT 167 04/08/2020    No results found for: LABCA2  No components found for: PPIRJJ884  No results for input(s): INR in the last 168 hours.  No results found for: LABCA2  Lab Results  Component Value Date   ZYS063 39 (H) 03/17/2020    No results found for: KZS010  No results found for: XNA355  No results found for: CA2729  No components found for: HGQUANT  Lab Results  Component Value Date   CEA1 121.91 (H) 03/17/2020   /  CEA ([facility])  Date Value Ref Range Status  03/17/2020 121.91 (H) 0.00 - 5.00 ng/mL Final    Comment:    (NOTE) This test was performed using Architect's Chemiluminescent Microparticle Immunoassay. Values obtained from different assay methods cannot be used interchangeably. Please note that 5-10% of patients who smoke may see CEA levels up to 6.9  ng/mL. Performed at [facility], [place] [address], [place], [place] [address]      No results found for: AFPTUMOR  No results found for: CHROMOGRNA  No results found for: HGBA, HGBA2QUANT, HGBFQUANT, HGBSQUAN (Hemoglobinopathy evaluation)   No results found for: LDH  Lab Results  Component Value Date   IRON 38 (L) 09/21/2018   TIBC 251 09/21/2018   IRONPCTSAT 15 (L) 09/21/2018   (Iron and TIBC)  Lab Results  Component Value Date   FERRITIN 138 09/21/2018    Urinalysis    Component Value Date/Time   COLORURINE YELLOW 02/18/2019 1056   APPEARANCEUR CLEAR 02/18/2019 1056   LABSPEC 1.018 02/18/2019 1056   PHURINE 6.0 02/18/2019 1056   GLUCOSEU NEGATIVE 02/18/2019 1056   GLUCOSEU NEGATIVE 07/20/2017 1056   HGBUR NEGATIVE 02/18/2019 1056   [place] 02/18/2019 1056   [place] 02/18/2019 1056   PROTEINUR 30 (A) 02/18/2019 1056   UROBILINOGEN 0.2 07/20/2017 1056   NITRITE NEGATIVE 02/18/2019 [place] 02/18/2019 1056    STUDIES:  ECHOCARDIOGRAM COMPLETE  Result Date: 03/29/2020    ECHOCARDIOGRAM REPORT   Patient Name:   Peter Wood Date of Exam: 03/29/2020 Medical Rec #:  [id number]        Height:       64.5 in Accession #:    [id number]       Weight:       141.4 lb Date of Birth:  Jan 27, 1935       BSA:          1.697 m Patient Age:    85 years         BP:  160/98 mmHg Patient Gender: M                HR:           57 bpm. Exam Location:  Outpatient Procedure: 2D Echo Indications:    Chemo V67.2  History:        Patient has prior history of Echocardiogram examinations, most                 recent 12/30/2019. Risk Factors:Hypertension.  Sonographer:    Mikki Santee RDCS (AE) Referring Phys: [id number] DANIEL R BENSIMHON IMPRESSIONS  1. Left ventricular ejection fraction, by estimation, is 50 to 55%. The left ventricle has low normal function. The left ventricle has no regional wall motion abnormalities.  Left ventricular diastolic parameters are consistent with Grade I diastolic dysfunction (impaired relaxation).  2. Right ventricular systolic function is normal. The right ventricular size is normal. There is normal pulmonary artery systolic pressure.  3. Left atrial size was moderately dilated.  4. Right atrial size was moderately dilated.  5. The mitral valve is normal in structure. Trivial mitral valve regurgitation.  6. The aortic valve is tricuspid. Aortic valve regurgitation is trivial. Mild aortic valve sclerosis is present, with no evidence of aortic valve stenosis.  7. The inferior vena cava is normal in size with greater than 50% respiratory variability, suggesting right atrial pressure of 3 mmHg. FINDINGS  Left Ventricle: Left ventricular ejection fraction, by estimation, is 50 to 55%. The left ventricle has low normal function. The left ventricle has no regional wall motion abnormalities. The left ventricular internal cavity size was normal in size. There is no left ventricular hypertrophy. Left ventricular diastolic parameters are consistent with Grade I diastolic dysfunction (impaired relaxation). Right Ventricle: The right ventricular size is normal. No increase in right ventricular wall thickness. Right ventricular systolic function is normal. There is normal pulmonary artery systolic pressure. The tricuspid regurgitant velocity is 2.55 m/s, and  with an assumed right atrial pressure of 3 mmHg, the estimated right ventricular systolic pressure is 32.4 mmHg. Left Atrium: Left atrial size was moderately dilated. Right Atrium: Right atrial size was moderately dilated. Pericardium: Trivial pericardial effusion is present. The pericardial effusion is posterior to the left ventricle. Mitral Valve: The mitral valve is normal in structure. Trivial mitral valve regurgitation. Tricuspid Valve: The tricuspid valve is normal in structure. Tricuspid valve regurgitation is mild. Aortic Valve: The aortic valve is  tricuspid. Aortic valve regurgitation is trivial. Mild aortic valve sclerosis is present, with no evidence of aortic valve stenosis. Mild to moderate aortic valve annular calcification. Pulmonic Valve: The pulmonic valve was normal in structure. Pulmonic valve regurgitation is trivial. Aorta: The aortic root and ascending aorta are structurally normal, with no evidence of dilitation. Venous: The inferior vena cava is normal in size with greater than 50% respiratory variability, suggesting right atrial pressure of 3 mmHg. IAS/Shunts: No atrial level shunt detected by color flow Doppler.  LEFT VENTRICLE PLAX 2D LVIDd:         4.90 cm      Diastology LVIDs:         3.80 cm      LV e' lateral:   7.51 cm/s LV PW:         1.00 cm      LV E/e' lateral: 7.9 LV IVS:        0.70 cm      LV e' medial:    5.55 cm/s LVOT diam:  2.00 cm      LV E/e' medial:  10.6 LV SV:         65 LV SV Index:   38           2D Longitudinal Strain LVOT Area:     3.14 cm     2D Strain GLS Avg:     -16.8 %  LV Volumes (MOD) LV vol d, MOD A2C: 83.9 ml LV vol d, MOD A4C: 101.0 ml LV vol s, MOD A2C: 39.4 ml LV vol s, MOD A4C: 54.9 ml LV SV MOD A2C:     44.5 ml LV SV MOD A4C:     101.0 ml LV SV MOD BP:      45.9 ml RIGHT VENTRICLE RV S prime:     17.70 cm/s TAPSE (M-mode): 2.5 cm LEFT ATRIUM             Index       RIGHT ATRIUM           Index LA diam:        3.60 cm 2.12 cm/m  RA Area:     23.10 cm LA Vol (A2C):   63.1 ml 37.18 ml/m RA Volume:   78.80 ml  46.43 ml/m LA Vol (A4C):   50.0 ml 29.46 ml/m LA Biplane Vol: 61.3 ml 36.12 ml/m  AORTIC VALVE LVOT Vmax:   106.00 cm/s LVOT Vmean:  66.800 cm/s LVOT VTI:    0.206 m  AORTA Ao Root diam: 2.90 cm MITRAL VALVE               TRICUSPID VALVE MV Area (PHT): 2.91 cm    TR Peak grad:   26.0 mmHg MV Decel Time: 261 msec    TR Vmax:        255.00 cm/s MV E velocity: 59.10 cm/s MV A velocity: 60.80 cm/s  SHUNTS MV E/A ratio:  0.97        Systemic VTI:  0.21 m                            Systemic  Diam: 2.00 cm Glori Bickers MD Electronically signed by Glori Bickers MD Signature Date/Time: 03/29/2020/9:03:46 AM    Final      ELIGIBLE FOR AVAILABLE RESEARCH PROTOCOL: no   ASSESSMENT: 85 y.o. [place], [place] man status post gastric biopsy 09/20/2018 showing adenocarcinoma, with a CA 19-9 greater than 65,000; staging studies showing an apparent mass adjacent to the head of the pancreas, innumerable liver lesions, upper abdominal mesenteric and peripancreatic lymphadenopathy, and an isolated sclerotic density in the left iliac bone  (a) genomics requested on gastric biopsy showed the tumor to be HER-2 amplified at 3+, PD-L1 positive, and T p53 mutated.  MSI is stable, mismatch repair status is proficient and the tumor mutational burden is intermediate.  (1) chest CT scan 09/19/2018 shows right lower lobe pulmonary embolism  (a) on intravenous heparin started 09/19/2018, transitioned to apixaban 10/01/2018  (2) genetics testing 10/04/2018: negative  (a) family history of prostate and early breast cancer; patient has a history of prostate cancer  (3) started gemcitabine/Abraxane 10/09/2018, repeated days 1, 8 and 15 of each 28-day cycle  (a) stopped after 1 cycle (3 doses) with reassessment of diagnosis based on CARIS results, noted above  (4) started oxaliplatin, capecitabine and trastuzumab 11/06/2018  (a) baseline echocardiogram 09/20/2018 shows an ejection fraction in the 55-60% range  (b) oxaliplatin and trastuzumab every 21  days started 11/06/2018  (c) capecitabine dose 1000 mg twice daily for 14 days of every 21-day cycle  (d) CT of the abdomen and pelvis 02/11/2019 shows marked improvement in his liver lesions and resolution of periportal adenopathy  (e) tumor markers also show significant response,  (f) echocardiogram 02/12/2019 shows an ejection fraction in the 60-65% range  (g) CT abd/pelvis show stable/ improved disease, tumor markers nearly normal  (h) oxaliplatin stopped  aftwer 04/30/2019 dose  (i) capecitabine stopped after September cycle   (5) trastuzumab maintenance started 05/20/2019, last dose 07/22/2019, with progression  (6) disease progression documented by lab work and CT scan 07/16/2019  (a) resumed FOLFOX 07/29/2019, continuing trastuzumab (every 14 days).  (b) restaging studies after 3 cycles showed evidence of progression (tumor markers equivocal)  (c) FOLFOX discontinued 08/26/2019  (7) started T-DM1 09/30/2019, repeat every 21 days  (a) pembrolizumab added 12/02/2019  (b) echocardiogram 08/25/2019 showed an ejection fraction in the 60-65% range  (c) repeat CT of the abdomen 12/11/2019 shows growth in 2 liver lesions  (8) started [facility] 12/23/2019 at 5.4 mg/kg.  (a) dose increased to 6.1 mg/kg with the third dose and two 6.4 (target dose) 03/17/2020  (9) iron deficiency anemia:  (a) Feraheme scheduled for 04/22 /2021 and 01/08/2020    PLAN: Somtochukwu continues on treatment with Enhertu every 3 weeks.  Today he will receive his second cycle since undergoing his most recent restaging that showed a mixed response.  I reviewed with him, that since his labs are in parameters, he will receive this treatment today, and then undergo restaging CT of his abdomen and pelvis as ordered.  I have asked my nurse to get this scheduled.    Depending on those results, he will either continue on Enhertu, or change treatment to Taxol/Cyramza.  I reviewed this with him in detail.  He is taking prednisone daily to help his fatigue and appetite.  I recommended he continue this.  We discussed small periods of exercise, along with energy conservation to help his fatigue.  I also encouraged him to continue to supplement his food intake with boost.    [place] has a good understanding of this plan.  He agrees with it.  We will see him back in 3 weeks for labs, f/u and his treatment. They know to call for any other issue that may develop before the next visit.  I  reviewed the above with Dr. Jana Hakim who is in agreement with the plan.    Total encounter time 30 minutes.Wilber Bihari, NP 04/08/20 1:45 PM Medical Oncology and Hematology [facility] [place], [place] [address] Tel. [phone]    Fax. [phone]   *Total Encounter Time as defined by the Centers for Medicare and Medicaid Services includes, in addition to the face-to-face time of a patient visit (documented in the note above) non-face-to-face time: obtaining and reviewing outside history, ordering and reviewing medications, tests or procedures, care coordination (communications with other health care professionals or caregivers) and documentation in the medical record.

## 2020-04-08 NOTE — Patient Instructions (Signed)

## 2020-04-08 NOTE — Patient Instructions (Signed)
[facility] Discharge Instructions for Patients Receiving Chemotherapy  Today you received the following immunotherapy agent: ENHERTU  To help prevent nausea and vomiting after your treatment, we encourage you to take your nausea medication as directed by your MD.   If you develop nausea and vomiting that is not controlled by your nausea medication, call the clinic.   BELOW ARE SYMPTOMS THAT SHOULD BE REPORTED IMMEDIATELY:  *FEVER GREATER THAN 100.5 F  *CHILLS WITH OR WITHOUT FEVER  NAUSEA AND VOMITING THAT IS NOT CONTROLLED WITH YOUR NAUSEA MEDICATION  *UNUSUAL SHORTNESS OF BREATH  *UNUSUAL BRUISING OR BLEEDING  TENDERNESS IN MOUTH AND THROAT WITH OR WITHOUT PRESENCE OF ULCERS  *URINARY PROBLEMS  *BOWEL PROBLEMS  UNUSUAL RASH Items with * indicate a potential emergency and should be followed up as soon as possible.  Feel free to call the clinic should you have any questions or concerns. The clinic phone number is [phone].  Please show the [facility] at check-in to the Emergency Department and triage nurse.

## 2020-04-09 ENCOUNTER — Telehealth: Payer: Self-pay | Admitting: Adult Health

## 2020-04-09 LAB — CANCER ANTIGEN 19-9: CA 19-9: 73 U/mL — ABNORMAL HIGH (ref 0–35)

## 2020-04-09 NOTE — Telephone Encounter (Signed)
No 7/29 los. No changes made to pt's schedule.  

## 2020-04-12 ENCOUNTER — Encounter: Payer: Self-pay | Admitting: Internal Medicine

## 2020-04-12 ENCOUNTER — Ambulatory Visit (INDEPENDENT_AMBULATORY_CARE_PROVIDER_SITE_OTHER): Payer: Medicare PPO | Admitting: Internal Medicine

## 2020-04-12 ENCOUNTER — Other Ambulatory Visit: Payer: Self-pay

## 2020-04-12 DIAGNOSIS — I10 Essential (primary) hypertension: Secondary | ICD-10-CM

## 2020-04-12 DIAGNOSIS — C799 Secondary malignant neoplasm of unspecified site: Secondary | ICD-10-CM | POA: Diagnosis not present

## 2020-04-12 DIAGNOSIS — R634 Abnormal weight loss: Secondary | ICD-10-CM | POA: Diagnosis not present

## 2020-04-12 DIAGNOSIS — I2699 Other pulmonary embolism without acute cor pulmonale: Secondary | ICD-10-CM | POA: Diagnosis not present

## 2020-04-12 DIAGNOSIS — C169 Malignant neoplasm of stomach, unspecified: Secondary | ICD-10-CM | POA: Diagnosis not present

## 2020-04-12 DIAGNOSIS — R110 Nausea: Secondary | ICD-10-CM | POA: Diagnosis not present

## 2020-04-12 MED ORDER — BENAZEPRIL HCL 40 MG PO TABS: 20.0000 mg | ORAL_TABLET | Freq: Every day | ORAL | 3 refills | Status: DC

## 2020-04-12 MED ORDER — APIXABAN 2.5 MG PO TABS: 2.5000 mg | ORAL_TABLET | Freq: Two times a day (BID) | ORAL | 11 refills | Status: AC

## 2020-04-12 NOTE — Assessment & Plan Note (Signed)
Zofran prn

## 2020-04-12 NOTE — Progress Notes (Signed)
Subjective:  Patient ID: Peter Wood, male    DOB: 05-May-1935  Age: 85 y.o. MRN: [id number]  CC: No chief complaint on file.   HPI Peter Wood presents for anticoagulation, cancer. C/o occ nausea Chemo on 8/18 pending   Outpatient Medications Prior to Visit  Medication Sig Dispense Refill  . amLODipine (NORVASC) 5 MG tablet Take 1 tablet (5 mg total) by mouth daily. 90 tablet 3  . benazepril (LOTENSIN) 40 MG tablet Take 1 tablet (40 mg total) by mouth daily. 90 tablet 3  . Cholecalciferol 1000 UNITS tablet Take 1,000 Units by mouth daily.     Marland Kitchen latanoprost (XALATAN) 0.005 % ophthalmic solution Place 1 drop into both eyes at bedtime.     . lidocaine-prilocaine (EMLA) cream Apply to affected area once 30 g 3  . Misc Natural Products (AIRBORNE ELDERBERRY) CHEW Chew by mouth daily. X 2 Gummies    . ondansetron (ZOFRAN) 4 MG tablet Take 1 tablet (4 mg total) by mouth every 8 (eight) hours as needed for nausea or vomiting. 20 tablet 1  . predniSONE (DELTASONE) 5 MG tablet Take 1 tablet by mouth daily with breakfast 7 tablet 0  . triamcinolone lotion (KENALOG) 0.1 % Apply 1 application topically 3 (three) times daily. 60 mL 2  . apixaban (ELIQUIS) 2.5 MG TABS tablet Take 1 tablet (2.5 mg total) by mouth 2 (two) times daily. 60 tablet 11   Facility-Administered Medications Prior to Visit  Medication Dose Route Frequency Provider Last Rate Last Admin  . sodium chloride flush (NS) 0.9 % injection 10 mL  10 mL Intravenous PRN Magrinat, Virgie Dad, MD      . sodium chloride flush (NS) 0.9 % injection 10 mL  10 mL Intracatheter PRN Magrinat, Virgie Dad, MD   10 mL at 12/23/19 1238    ROS: Review of Systems  Constitutional: Positive for fatigue and unexpected weight change. Negative for appetite change.  HENT: Negative for congestion, nosebleeds, sneezing, sore throat and trouble swallowing.   Eyes: Negative for itching and visual disturbance.  Respiratory: Negative for cough.     Cardiovascular: Negative for chest pain, palpitations and leg swelling.  Gastrointestinal: Positive for nausea. Negative for abdominal distention, blood in stool and diarrhea.  Genitourinary: Negative for frequency and hematuria.  Musculoskeletal: Negative for back pain, gait problem, joint swelling and neck pain.  Skin: Negative for rash.  Neurological: Negative for dizziness, tremors, speech difficulty and weakness.  Psychiatric/Behavioral: Negative for agitation, dysphoric mood and sleep disturbance. The patient is not nervous/anxious.     Objective:  BP 116/68 (BP Location: Right Arm, Patient Position: Sitting, Cuff Size: Normal)   Pulse 78   Temp 98.8 F (37.1 C) (Oral)   Ht 5' 4.5" (1.638 m)   Wt 135 lb (61.2 kg)   SpO2 99%   BMI 22.81 kg/m   BP Readings from Last 3 Encounters:  04/12/20 116/68  04/08/20 121/68  03/29/20 (!) 160/98    Wt Readings from Last 3 Encounters:  04/12/20 135 lb (61.2 kg)  04/08/20 138 lb 1.6 oz (62.6 kg)  03/29/20 137 lb (62.1 kg)    Physical Exam Constitutional:      General: He is not in acute distress.    Appearance: He is well-developed.     Comments: NAD  Eyes:     Conjunctiva/sclera: Conjunctivae normal.     Pupils: Pupils are equal, round, and reactive to light.  Neck:     Thyroid: No thyromegaly.  Vascular: No JVD.  Cardiovascular:     Rate and Rhythm: Normal rate and regular rhythm.     Heart sounds: Normal heart sounds. No murmur heard.  No friction rub. No gallop.   Pulmonary:     Effort: Pulmonary effort is normal. No respiratory distress.     Breath sounds: Normal breath sounds. No wheezing or rales.  Chest:     Chest wall: No tenderness.  Abdominal:     General: Bowel sounds are normal. There is no distension.     Palpations: Abdomen is soft. There is no mass.     Tenderness: There is no abdominal tenderness. There is no guarding or rebound.  Musculoskeletal:        General: No tenderness. Normal range of  motion.     Cervical back: Normal range of motion.  Lymphadenopathy:     Cervical: No cervical adenopathy.  Skin:    General: Skin is warm and dry.     Findings: No rash.  Neurological:     Mental Status: He is alert and oriented to person, place, and time.     Cranial Nerves: No cranial nerve deficit.     Motor: No abnormal muscle tone.     Coordination: Coordination normal.     Gait: Gait normal.     Deep Tendon Reflexes: Reflexes are normal and symmetric.  Psychiatric:        Behavior: Behavior normal.        Thought Content: Thought content normal.        Judgment: Judgment normal.     Lab Results  Component Value Date   WBC 4.7 04/08/2020   HGB 10.5 (L) 04/08/2020   HCT 32.0 (L) 04/08/2020   PLT 167 04/08/2020   GLUCOSE 121 (H) 04/08/2020   CHOL 193 01/18/2018   TRIG 84.0 01/18/2018   HDL 50.50 01/18/2018   LDLDIRECT 126.3 09/23/2007   LDLCALC 126 (H) 01/18/2018   ALT 45 (H) 04/08/2020   AST 65 (H) 04/08/2020   NA 140 04/08/2020   K 3.5 04/08/2020   CL 104 04/08/2020   CREATININE 0.92 04/08/2020   BUN 12 04/08/2020   CO2 26 04/08/2020   TSH 0.334 04/08/2020   PSA 0.54 09/18/2018   INR 1.4 11/04/2018    ECHOCARDIOGRAM COMPLETE  Result Date: 03/29/2020    ECHOCARDIOGRAM REPORT   Patient Name:   Peter Wood Date of Exam: 03/29/2020 Medical Rec #:  [id number]        Height:       64.5 in Accession #:    [id number]       Weight:       141.4 lb Date of Birth:  1935-03-20       BSA:          1.697 m Patient Age:    25 years         BP:           160/98 mmHg Patient Gender: M                HR:           57 bpm. Exam Location:  Outpatient Procedure: 2D Echo Indications:    Chemo V67.2  History:        Patient has prior history of Echocardiogram examinations, most                 recent 12/30/2019. Risk Factors:Hypertension.  Sonographer:    Mikki Santee RDCS (AE) Referring  Phys: [id number] DANIEL R BENSIMHON IMPRESSIONS  1. Left ventricular ejection fraction, by  estimation, is 50 to 55%. The left ventricle has low normal function. The left ventricle has no regional wall motion abnormalities. Left ventricular diastolic parameters are consistent with Grade I diastolic dysfunction (impaired relaxation).  2. Right ventricular systolic function is normal. The right ventricular size is normal. There is normal pulmonary artery systolic pressure.  3. Left atrial size was moderately dilated.  4. Right atrial size was moderately dilated.  5. The mitral valve is normal in structure. Trivial mitral valve regurgitation.  6. The aortic valve is tricuspid. Aortic valve regurgitation is trivial. Mild aortic valve sclerosis is present, with no evidence of aortic valve stenosis.  7. The inferior vena cava is normal in size with greater than 50% respiratory variability, suggesting right atrial pressure of 3 mmHg. FINDINGS  Left Ventricle: Left ventricular ejection fraction, by estimation, is 50 to 55%. The left ventricle has low normal function. The left ventricle has no regional wall motion abnormalities. The left ventricular internal cavity size was normal in size. There is no left ventricular hypertrophy. Left ventricular diastolic parameters are consistent with Grade I diastolic dysfunction (impaired relaxation). Right Ventricle: The right ventricular size is normal. No increase in right ventricular wall thickness. Right ventricular systolic function is normal. There is normal pulmonary artery systolic pressure. The tricuspid regurgitant velocity is 2.55 m/s, and  with an assumed right atrial pressure of 3 mmHg, the estimated right ventricular systolic pressure is 70.9 mmHg. Left Atrium: Left atrial size was moderately dilated. Right Atrium: Right atrial size was moderately dilated. Pericardium: Trivial pericardial effusion is present. The pericardial effusion is posterior to the left ventricle. Mitral Valve: The mitral valve is normal in structure. Trivial mitral valve regurgitation.  Tricuspid Valve: The tricuspid valve is normal in structure. Tricuspid valve regurgitation is mild. Aortic Valve: The aortic valve is tricuspid. Aortic valve regurgitation is trivial. Mild aortic valve sclerosis is present, with no evidence of aortic valve stenosis. Mild to moderate aortic valve annular calcification. Pulmonic Valve: The pulmonic valve was normal in structure. Pulmonic valve regurgitation is trivial. Aorta: The aortic root and ascending aorta are structurally normal, with no evidence of dilitation. Venous: The inferior vena cava is normal in size with greater than 50% respiratory variability, suggesting right atrial pressure of 3 mmHg. IAS/Shunts: No atrial level shunt detected by color flow Doppler.  LEFT VENTRICLE PLAX 2D LVIDd:         4.90 cm      Diastology LVIDs:         3.80 cm      LV e' lateral:   7.51 cm/s LV PW:         1.00 cm      LV E/e' lateral: 7.9 LV IVS:        0.70 cm      LV e' medial:    5.55 cm/s LVOT diam:     2.00 cm      LV E/e' medial:  10.6 LV SV:         65 LV SV Index:   38           2D Longitudinal Strain LVOT Area:     3.14 cm     2D Strain GLS Avg:     -16.8 %  LV Volumes (MOD) LV vol d, MOD A2C: 83.9 ml LV vol d, MOD A4C: 101.0 ml LV vol s, MOD A2C: 39.4 ml LV vol s,  MOD A4C: 54.9 ml LV SV MOD A2C:     44.5 ml LV SV MOD A4C:     101.0 ml LV SV MOD BP:      45.9 ml RIGHT VENTRICLE RV S prime:     17.70 cm/s TAPSE (M-mode): 2.5 cm LEFT ATRIUM             Index       RIGHT ATRIUM           Index LA diam:        3.60 cm 2.12 cm/m  RA Area:     23.10 cm LA Vol (A2C):   63.1 ml 37.18 ml/m RA Volume:   78.80 ml  46.43 ml/m LA Vol (A4C):   50.0 ml 29.46 ml/m LA Biplane Vol: 61.3 ml 36.12 ml/m  AORTIC VALVE LVOT Vmax:   106.00 cm/s LVOT Vmean:  66.800 cm/s LVOT VTI:    0.206 m  AORTA Ao Root diam: 2.90 cm MITRAL VALVE               TRICUSPID VALVE MV Area (PHT): 2.91 cm    TR Peak grad:   26.0 mmHg MV Decel Time: 261 msec    TR Vmax:        255.00 cm/s MV E velocity:  59.10 cm/s MV A velocity: 60.80 cm/s  SHUNTS MV E/A ratio:  0.97        Systemic VTI:  0.21 m                            Systemic Diam: 2.00 cm Glori Bickers MD Electronically signed by Glori Bickers MD Signature Date/Time: 03/29/2020/9:03:46 AM    Final     Assessment & Plan:   There are no diagnoses linked to this encounter.   Meds ordered this encounter  Medications  . apixaban (ELIQUIS) 2.5 MG TABS tablet    Sig: Take 1 tablet (2.5 mg total) by mouth 2 (two) times daily.    Dispense:  60 tablet    Refill:  11     Follow-up: No follow-ups on file.  Walker Kehr, MD

## 2020-04-12 NOTE — Assessment & Plan Note (Signed)
On Eliquis

## 2020-04-12 NOTE — Assessment & Plan Note (Signed)
F/u w/Dr Magrinat

## 2020-04-12 NOTE — Assessment & Plan Note (Signed)
Low BP Reduce Benazepril to 20 mg/d

## 2020-04-12 NOTE — Assessment & Plan Note (Addendum)
Wt Readings from Last 3 Encounters:  04/12/20 135 lb (61.2 kg)  04/08/20 138 lb 1.6 oz (62.6 kg)  03/29/20 137 lb (62.1 kg)    Reduce Benazepril to 20 mg/d due to low BP, wt loss

## 2020-04-21 ENCOUNTER — Telehealth: Payer: Self-pay | Admitting: *Deleted

## 2020-04-21 NOTE — Telephone Encounter (Signed)
This RN spoke with pt per his call wanting to report noted " black spots on my tongue ".  He ( and his wife ) noted them this AM when he was brushing his teeth.  He states he has about 5 small areas that are dark on his tongue.  He denies any soreness, changes in taste or interference with food/fluid intake.  He does not see any areas on the inside of his gums.  The spots are dark and without any " hair " on them.  This RN discussed above may be related to chemotherapy- will review with MD.

## 2020-04-22 ENCOUNTER — Other Ambulatory Visit: Payer: Self-pay

## 2020-04-22 ENCOUNTER — Ambulatory Visit (HOSPITAL_COMMUNITY)
Admission: RE | Admit: 2020-04-22 | Discharge: 2020-04-22 | Disposition: A | Discharge status: Home / Self Care | Payer: Medicare PPO | Source: Ambulatory Visit | Attending: Oncology | Admitting: Oncology

## 2020-04-22 DIAGNOSIS — C61 Malignant neoplasm of prostate: Secondary | ICD-10-CM | POA: Diagnosis not present

## 2020-04-22 DIAGNOSIS — I723 Aneurysm of iliac artery: Secondary | ICD-10-CM | POA: Diagnosis not present

## 2020-04-22 DIAGNOSIS — C168 Malignant neoplasm of overlapping sites of stomach: Secondary | ICD-10-CM | POA: Diagnosis not present

## 2020-04-22 DIAGNOSIS — C787 Secondary malignant neoplasm of liver and intrahepatic bile duct: Secondary | ICD-10-CM | POA: Diagnosis not present

## 2020-04-22 DIAGNOSIS — C169 Malignant neoplasm of stomach, unspecified: Secondary | ICD-10-CM | POA: Diagnosis not present

## 2020-04-22 DIAGNOSIS — N400 Enlarged prostate without lower urinary tract symptoms: Secondary | ICD-10-CM | POA: Diagnosis not present

## 2020-04-22 DIAGNOSIS — C799 Secondary malignant neoplasm of unspecified site: Secondary | ICD-10-CM | POA: Insufficient documentation

## 2020-04-22 DIAGNOSIS — K3189 Other diseases of stomach and duodenum: Secondary | ICD-10-CM | POA: Diagnosis not present

## 2020-04-22 MED ORDER — SODIUM CHLORIDE (PF) 0.9 % IJ SOLN
INTRAMUSCULAR | Status: AC
  Filled 2020-04-22: qty 50

## 2020-04-22 MED ORDER — HEPARIN SOD (PORK) LOCK FLUSH 100 UNIT/ML IV SOLN
500.0000 [IU] | Freq: Once | INTRAVENOUS | Status: AC
  Administered 2020-04-22: 500 [IU] via INTRAVENOUS

## 2020-04-22 MED ORDER — HEPARIN SOD (PORK) LOCK FLUSH 100 UNIT/ML IV SOLN
INTRAVENOUS | Status: AC
  Filled 2020-04-22: qty 5

## 2020-04-22 MED ORDER — IOHEXOL 300 MG/ML  SOLN
100.0000 mL | Freq: Once | INTRAMUSCULAR | Status: AC | PRN
  Administered 2020-04-22: 100 mL via INTRAVENOUS

## 2020-04-27 NOTE — Progress Notes (Addendum)
[facility]  [phone] [phone]     ID: HERMANN DOTTAVIO DOB: 08-05-1935  MR#: [id number]  XNA#:[id number]  Patient Care Team: Cassandria Anger, MD as PCP - General Milus Banister, MD as Attending Physician (Gastroenterology) Magrinat, Virgie Dad, MD as Consulting Physician (Oncology) Gatha Mayer, MD as Consulting Physician (Gastroenterology) [name], MD as Consulting Physician (Ophthalmology) Ramon Dredge, MD as Referring Physician (Radiation Oncology) Larey Dresser, MD as Consulting Physician (Cardiology) OTHER MD:   CHIEF COMPLAINT: Metastatic gastric adenocarcinoma  CURRENT TREATMENT: Enhertu   INTERVAL HISTORY: Everest returns today for follow-up and treatment of his metastatic gastric adenocarcinoma.   He was started on Enhertu on 12/23/2019. He receives this every three weeks and is tolerating this well.  He did have restaging of the CT abdomen and pelvis on 02/26/2020 that showed a mixed response to treatment.  He met with Dr. Jana Hakim to review these results and they decided to do 2 more cycles and restage.   Today he is here for his next cycle of Enhertu.  His last restaging was completed on 04/22/2020 that demonstrated no new or progressive metastatic disease.     His most recent echocardiogram was completed on 03/29/2020 and showed an EF of 50-55%.  He was cleared to continue treatment by Dr. Haroldine Laws.     REVIEW OF SYSTEMS: Raekwan notes that he is doing well today.  His weight is down another couple of pounds and he notes that he has difficulty with the desire to eat at night.  He takes prednisone every morning, and that helps his appetite during the day.    He is fatigued, and that is unchanged from prior.  He does get up and walk and get around with his wife to keep his strength up which is good.  He had one episode of nausea in the past couple of weeks and this was related to not eating.  This resolved  after he ate.  He has had a couple of coughing spells that happen on occasion seemingly out of nowhere and then resolve.  He has not had any other issues wit this.  Otherwise, Reino is doing well.  He denies any fever, chills, chest pain, palpitations, bowel/bladder changes, new pain, headaches, vomiting, vision issues.  A detailed ROS was otherwise non contributory.      We are following his tumor markers with CEA and CA 19-9 noted below:  Results for RASHAWD, LASKARIS (MRN [id number]) as of 04/28/2020 09:16  Ref. Range 01/13/2020 11:28 02/03/2020 14:05 02/24/2020 11:37 03/17/2020 12:23 04/08/2020 13:13  CEA ([facility]) Latest Ref Range: 0.00 - 5.00 ng/mL 98.57 (H) 94.36 (H) 107.70 (H) 121.91 (H) 143.33 (H)  Results for NIKITA, HUMBLE (MRN [id number]) as of 04/28/2020 09:16  Ref. Range 01/13/2020 11:28 02/03/2020 14:05 02/24/2020 11:37 03/17/2020 12:23 04/08/2020 13:13  CA 19-9 Latest Ref Range: 0 - 35 U/mL 92 (H) 71 (H) 60 (H) 65 (H) 73 (H)   HISTORY OF CURRENT ILLNESS: From the original intake note:  SAMY RYNER presented to the emergency room 09/07/2018 with mid/upper abdominal pain.  He reported weight loss of 25 pounds over the past 6 months and significant anorexia.  His liver function tests were abnormal and a right upper quadrant ultrasound was obtained.  This was read as concerning for malignancy and a CT of the abdomen and pelvis with contrast was obtained the same day, showing  1. Too numerous to count hypodense masses  of the liver concerning for hepatic metastasis. In the region of the porta hepatis, there are hypodense lesions more likely associated with the liver though the pancreatic head is partially obscured by a 3.2 cm hypodense mass. Findings are more likely associated with the liver and less likely an isolated pancreatic mass. 2. Upper abdominal mesenteric and peripancreatic lymphadenopathy suspicious for metastatic disease. 3. 6 mm sclerotic density in the left iliac bone  adjacent to the SI joint. Given history of prostate cancer, differential considerations would include osteoblastic metastasis or possibly bone island. Showed only diverticular disease.  He was referred to GI and Dr. Ardis Hughs notes a colonoscopy performed 04/23/2017 makes the possibility of colon cancer unlikely.  Further work-up was planned, but on 09/19/2018 the patient again presented to the emergency room with chest pain, now in a different area, and a staging CT scan of the chest showed filling defects in the right lower lobe pulmonary arteries consistent with embolism.  There was a small pericardial effusion and an additional 2.4 cm left thyroid mass.  Aortic atherosclerosis was noted  He was started on intravenous heparin and on 09/20/2018 underwent upper endoscopy under Dr. Carlean Purl showing a giant nonbleeding cratered gastric ulcer in the posterior wall of the stomach.  Biopsies of this procedure showed (SZA 20-187) adenocarcinoma.   At that point we were consulted and we obtained tumor markers which included a CEA of 1318.2, a CA 19-9 of 67,479, a normal AFP and a normal PSA.  I discussed the case with pathology and they do not feel that additional testing would be helpful in terms of discriminating between a primary gastric and a primary biliary/pancreatic tumor.  The patient's subsequent history is as detailed below.   PAST MEDICAL HISTORY: Past Medical History:  Diagnosis Date  . Arthritis   . Diverticulosis of colon   . Family history of breast cancer   . Family history of prostate cancer   . HTN (hypertension)   . Hx of colonic polyp   . Pneumonia    as a child/baby  . Poor circulation of extremity    right leg  . Prostate cancer ([place]) 2015   prostate cancer - radiation treated    PAST SURGICAL HISTORY: Past Surgical History:  Procedure Laterality Date  . [place] VITRECTOMY WITH 20 GAUGE MVR PORT FOR MACULAR HOLE Right 09/19/2016   Procedure: 25 GAUGE PARS PLANA  VITRECTOMY WITH 20 GAUGE MVR PORT FOR MACULAR HOLE, MEMBRANE PEEL, SERUM PATCH, GAS FLUID EXCHANGE, HEADSCOPE LASER;  Surgeon: [name], MD;  Location: [facility];  Service: Ophthalmology;  Laterality: Right;  . BIOPSY  09/20/2018   Procedure: BIOPSY;  Surgeon: Gatha Mayer, MD;  Location: [facility] ENDOSCOPY;  Service: Endoscopy;;  . CATARACT EXTRACTION  08/27/12   Right  . COLONOSCOPY    . ESOPHAGOGASTRODUODENOSCOPY (EGD) WITH PROPOFOL N/A 09/20/2018   Procedure: ESOPHAGOGASTRODUODENOSCOPY (EGD) WITH PROPOFOL;  Surgeon: Gatha Mayer, MD;  Location: [facility];  Service: Endoscopy;  Laterality: N/A;  . HAND SURGERY  2016   right thumb  . IR IMAGING GUIDED PORT INSERTION  11/04/2018  . KNEE SURGERY Left 1989    FAMILY HISTORY: Family History  Problem Relation Age of Onset  . Prostate cancer Father        dx >50  . Hypertension Mother   . Ulcers Mother   . Breast cancer Sister        dx 20's  . Prostate cancer Brother  dx under 50, metasttic, cause of his death  . Ulcers Brother   . Ulcers Sister    Aloysius's father died from heart complications with a pacemaker at age 80; Sederick's father also has prostate cancer. Patients' mother died from natural causes at age 21. The patient has 4 brothers and 3 sisters. One of [place] sisters had breast cancer in her 12s and a brother had prostate cancer. Patient denies anyone in her family having ovarian, or pancreatic cancer. Jayson mentions that his youngest sister and his brother with prostate cancer also had stomach ulcers.    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 His wife, [name], is a retired [employer] summer Radiographer, therapeutic. As of 09/2018, they have been married for 56 years. They have 4 children, Claiborne Billings, [place], [place], and [place]. Claiborne Billings lives in [place] and works at the [employer]. Sherrod lives in [place] and works at [employer] and as a part Horticulturist, commercial. Germane lives in [place] and is a Editor, commissioning for a medical company. Beverely Low lives in [place] and works at [employer]. Mendell has 7 grandchildren and 1 step great grandchild. He attends [employer].    ADVANCED DIRECTIVES: His wife, [name], is automatically his healthcare power of attorney.     HEALTH MAINTENANCE: Social History   Tobacco Use  . Smoking status: Former Smoker    Years: 4.00    Quit date: 09/12/1967    Years since quitting: 52.6  . Smokeless tobacco: Never Used  Vaping Use  . Vaping Use: Never used  Substance Use Topics  . Alcohol use: No  . Drug use: No    Colonoscopy: 2018/[facility]  PSA: 0.54 on 09/18/2018  No Known Allergies  Current Outpatient Medications  Medication Sig Dispense Refill  . amLODipine (NORVASC) 5 MG tablet Take 1 tablet (5 mg total) by mouth daily. 90 tablet 3  . apixaban (ELIQUIS) 2.5 MG TABS tablet Take 1 tablet (2.5 mg total) by mouth 2 (two) times daily. 60 tablet 11  . benazepril (LOTENSIN) 40 MG tablet Take 0.5 tablets (20 mg total) by mouth daily. 90 tablet 3  . Cholecalciferol 1000 UNITS tablet Take 1,000 Units by mouth daily.     Marland Kitchen latanoprost (XALATAN) 0.005 % ophthalmic solution Place 1 drop into both eyes at bedtime.     . lidocaine-prilocaine (EMLA) cream Apply to affected area once 30 g 3  . Misc Natural Products (AIRBORNE ELDERBERRY) CHEW Chew by mouth daily. X 2 Gummies    . ondansetron (ZOFRAN) 4 MG tablet Take 1 tablet (4 mg total) by mouth every 8 (eight) hours as needed for nausea or vomiting. 20 tablet 1  . predniSONE (DELTASONE) 5 MG tablet Take 1 tablet by mouth daily with breakfast 7 tablet 0  . triamcinolone lotion (KENALOG) 0.1 % Apply 1 application topically 3 (three) times daily. 60 mL 2   No current facility-administered medications for this visit.   Facility-Administered Medications Ordered in Other Visits  Medication Dose Route Frequency Provider Last Rate Last Admin  . sodium chloride flush (NS) 0.9 % injection 10 mL  10 mL  Intravenous PRN Magrinat, Virgie Dad, MD      . sodium chloride flush (NS) 0.9 % injection 10 mL  10 mL Intracatheter PRN Magrinat, Virgie Dad, MD   10 mL at 12/23/19 1238    OBJECTIVE:   Vitals:   04/28/20 0856  BP: (!) 149/76  Pulse: 77  Resp: 17  Temp: 98.9 F (37.2  C)  SpO2: 100%   Wt Readings from Last 3 Encounters:  04/28/20 133 lb 4.8 oz (60.5 kg)  04/12/20 135 lb (61.2 kg)  04/08/20 138 lb 1.6 oz (62.6 kg)   Body mass index is 22.53 kg/m.    ECOG FS:1 - Symptomatic but completely ambulatory GENERAL: Patient is a well appearing male in no acute distress HEENT:  Sclerae anicteric.  Mask in place Neck is supple.  NODES:  No cervical, supraclavicular, or axillary lymphadenopathy palpated. LUNGS:  Clear to auscultation bilaterally.  No wheezes or rhonchi. HEART:  Regular rate and rhythm. No murmur appreciated. ABDOMEN:  Soft, nontender.  Positive, normoactive bowel sounds. No organomegaly palpated. MSK:  No focal spinal tenderness to palpation EXTREMITIES:  No peripheral edema.   SKIN:  Clear with no obvious rashes or skin changes. No nail dyscrasia. NEURO:  Nonfocal. Well oriented.  Appropriate affect.    LAB RESULTS:  CMP     Component Value Date/Time   NA 140 04/08/2020 1313   K 3.5 04/08/2020 1313   CL 104 04/08/2020 1313   CO2 26 04/08/2020 1313   GLUCOSE 121 (H) 04/08/2020 1313   GLUCOSE 105 (H) 09/13/2006 0900   BUN 12 04/08/2020 1313   CREATININE 0.92 04/08/2020 1313   CREATININE 1.02 10/16/2018 0936   CALCIUM 9.3 04/08/2020 1313   PROT 6.3 (L) 04/08/2020 1313   ALBUMIN 2.9 (L) 04/08/2020 1313   AST 65 (H) 04/08/2020 1313   AST 76 (H) 10/16/2018 0936   ALT 45 (H) 04/08/2020 1313   ALT 47 (H) 10/16/2018 0936   ALKPHOS 318 (H) 04/08/2020 1313   BILITOT 1.0 04/08/2020 1313   BILITOT 0.7 10/16/2018 0936   GFRNONAA >60 04/08/2020 1313   GFRNONAA >60 10/16/2018 0936   GFRAA >60 04/08/2020 1313   GFRAA >60 10/16/2018 0936    No results found for:  TOTALPROTELP, ALBUMINELP, A1GS, A2GS, BETS, BETA2SER, GAMS, MSPIKE, SPEI  No results found for: KPAFRELGTCHN, LAMBDASER, KAPLAMBRATIO  Lab Results  Component Value Date   WBC 4.3 04/28/2020   NEUTROABS 2.5 04/28/2020   HGB 10.0 (L) 04/28/2020   HCT 29.7 (L) 04/28/2020   MCV 102.8 (H) 04/28/2020   PLT 185 04/28/2020    No results found for: LABCA2  No components found for: XBMWUX324  No results for input(s): INR in the last 168 hours.  No results found for: LABCA2  Lab Results  Component Value Date   CAN199 73 (H) 04/08/2020    No results found for: MWN027  No results found for: OZD664  No results found for: CA2729  No components found for: HGQUANT  Lab Results  Component Value Date   CEA1 143.33 (H) 04/08/2020   /  CEA ([facility])  Date Value Ref Range Status  04/08/2020 143.33 (H) 0.00 - 5.00 ng/mL Final    Comment:    (NOTE) This test was performed using Architect's Chemiluminescent Microparticle Immunoassay. Values obtained from different assay methods cannot be used interchangeably. Please note that 5-10% of patients who smoke may see CEA levels up to 6.9 ng/mL. Performed at [facility], [place] [address] [place], [place] [address]      No results found for: AFPTUMOR  No results found for: [place]  No results found for: HGBA, HGBA2QUANT, HGBFQUANT, HGBSQUAN (Hemoglobinopathy evaluation)   No results found for: LDH  Lab Results  Component Value Date   IRON 38 (L) 09/21/2018   TIBC 251 09/21/2018   IRONPCTSAT 15 (L) 09/21/2018   (  Iron and TIBC)  Lab Results  Component Value Date   FERRITIN 138 09/21/2018    Urinalysis    Component Value Date/Time   COLORURINE YELLOW 02/18/2019 1056   APPEARANCEUR CLEAR 02/18/2019 1056   LABSPEC 1.018 02/18/2019 1056   PHURINE 6.0 02/18/2019 1056   GLUCOSEU NEGATIVE 02/18/2019 1056   GLUCOSEU NEGATIVE 07/20/2017 1056   HGBUR NEGATIVE 02/18/2019 1056   BILIRUBINUR  NEGATIVE 02/18/2019 1056   KETONESUR NEGATIVE 02/18/2019 1056   PROTEINUR 30 (A) 02/18/2019 1056   UROBILINOGEN 0.2 07/20/2017 1056   NITRITE NEGATIVE 02/18/2019 1056   LEUKOCYTESUR NEGATIVE 02/18/2019 1056    STUDIES:  CT Abdomen Pelvis W Contrast  Result Date: 04/22/2020 CLINICAL DATA:  Follow-up metastatic prostate carcinoma. Ongoing immunotherapy. EXAM: CT ABDOMEN AND PELVIS WITH CONTRAST TECHNIQUE: Multidetector CT imaging of the abdomen and pelvis was performed using the standard protocol following bolus administration of intravenous contrast. CONTRAST:  167m OMNIPAQUE IOHEXOL 300 MG/ML  SOLN COMPARISON:  02/26/2020 FINDINGS: Lower Chest: No acute findings. Hepatobiliary: Several hypovascular metastases in both the right and left lobes show no significant change since previous study. Index lesion in the anterior right lobe measures 6.2 x 4.8 cm on image 15/2, compared to 5.9 x 5.2 cm previously. No new or enlarging liver lesions are identified. Intrahepatic biliary ductal dilatation is again seen in the left lobe. Gallbladder is unremarkable. Pancreas:  No mass or inflammatory changes. Spleen: Within normal limits in size and appearance. Adrenals/Urinary Tract: No masses identified. A few tiny renal cysts are again noted bilaterally. No evidence of ureteral calculi or hydronephrosis. Stomach/Bowel: Wall thickening involving the distal body of the stomach shows no significant change. Adjacent no evidence of obstruction, inflammatory process or abnormal fluid collections. Vascular/Lymphatic: Mild lymphadenopathy in gastrocolic ligament adjacent to the gastric wall thickening described above shows no significant change, with index nodule measuring 1.7 x 0.9 cm on image 31/2. No other sites of lymphadenopathy identified. No abdominal aortic aneurysm. 3.0 cm thick-walled aneurysm of proximal right internal iliac artery shows no significant change. Reproductive:  Stable mildly enlarged prostate. Other:   None. Musculoskeletal:  No suspicious bone lesions identified. IMPRESSION: No significant change in hepatic metastatic disease. Stable wall thickening of the distal gastric body, and mild adjacent gastrocolic ligament lymphadenopathy. No new or progressive metastatic disease identified. 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 Electronically Signed   By: JMarlaine HindM.D.   On: 04/22/2020 09:20     ELIGIBLE FOR AVAILABLE RESEARCH PROTOCOL: no   ASSESSMENT: 85y.o. G[place]asgow N[place]aman status post gastric biopsy 09/20/2018 showing adenocarcinoma, with a CA 19-9 greater than 65,000; staging studies showing an apparent mass adjacent to the head of the pancreas, innumerable liver lesions, upper abdominal mesenteric and peripancreatic lymphadenopathy, and an isolated sclerotic density in the left iliac bone  (a) genomics requested on gastric biopsy showed the tumor to be HER-2 amplified at 3+, PD-L1 positive, and T p53 mutated.  MSI is stable, mismatch repair status is proficient and the tumor mutational burden is intermediate.  (1) chest CT scan 09/19/2018 shows right lower lobe pulmonary embolism  (a) on intravenous heparin started 09/19/2018, transitioned to apixaban 10/01/2018  (2) genetics testing 10/04/2018: negative  (a) family history of prostate and early breast cancer; patient has a history of prostate cancer  (3) started gemcitabine/Abraxane 10/09/2018, repeated days 1, 8 and 15 of each 28-day cycle  (a) stopped after 1 cycle (3 doses) with reassessment of diagnosis based on CARIS results, noted above  (  4) started oxaliplatin, capecitabine and trastuzumab 11/06/2018  (a) baseline echocardiogram 09/20/2018 shows an ejection fraction in the 55-60% range  (b) oxaliplatin and trastuzumab every 21 days started 11/06/2018  (c) capecitabine dose 1000 mg twice daily for 14 days of every 21-day cycle  (d) CT of the abdomen and pelvis 02/11/2019 shows marked improvement in his  liver lesions and resolution of periportal adenopathy  (e) tumor markers also show significant response,  (f) echocardiogram 02/12/2019 shows an ejection fraction in the 60-65% range  (g) CT abd/pelvis show stable/ improved disease, tumor markers nearly normal  (h) oxaliplatin stopped aftwer 04/30/2019 dose  (i) capecitabine stopped after September cycle   (5) trastuzumab maintenance started 05/20/2019, last dose 07/22/2019, with progression  (6) disease progression documented by lab work and CT scan 07/16/2019  (a) resumed FOLFOX 07/29/2019, continuing trastuzumab (every 14 days).  (b) restaging studies after 3 cycles showed evidence of progression (tumor markers equivocal)  (c) FOLFOX discontinued 08/26/2019  (7) started T-DM1 09/30/2019, repeat every 21 days  (a) pembrolizumab added 12/02/2019  (b) echocardiogram 08/25/2019 showed an ejection fraction in the 60-65% range  (c) repeat CT of the abdomen 12/11/2019 shows growth in 2 liver lesions  (8) started [facility] 12/23/2019 at 5.4 mg/kg.  (a) dose increased to 6.1 mg/kg with the third dose and two 6.4 (target dose) 03/17/2020   (b) Changed to every 4 weeks after receiving 04/28/2020 dose  (9) iron deficiency anemia:  (a) Feraheme scheduled for 04/22 /2021 and 01/08/2020    PLAN: Rik continues on therapy with Enhertu for his metastatic gastric cancer. He met with myself and Dr. Jana Hakim to review his most recent restaging CT abdomen and pelvis that was completed on 04/22/2020.  His cancer remains stable and there was no progression noted.  This is good news and meets our goal of therapy which is control.    Due to his side effects of mild weight loss, and fatigue, we will lengthen the amount of time between his treatments from 3 weeks to 4 weeks.  He understands this.  He will undergo restaging after his treatment in 8 weeks.  We are also following his tumor markers which are noted above.    Kim will continue to work on his  appetite and intake and he will continue to meet with nutrition.    He will undergo an xray today to evaluate his cough.  He will return in 4 weeks for labs, f/u, and his next Enhertu.      Total encounter time 45 minutes.Wilber Bihari, NP 04/28/20 9:16 AM Medical Oncology and Hematology [facility] [place], [place] [address] Tel. [phone]    Fax. [phone]   ADDENDUM: Shaheim appears to continue to benefit from [facility], although we are not seeing tumor shrinkage at this point.  It is also of concern that the tumor markers are drifting up although very slowly.  They are certainly still much lower than at baseline.  He is also beginning to wear down from the treatments.  I think it is going to be difficult to treat him every 3 weeks and after today we will go to every 4-week treatments.  He will be restaged after 2 more doses (in other words in about 10 weeks).  We are going to obtain a chest x-ray today to evaluate further his mild pulmonary symptoms.  Of course we are concerned regarding the possibility of COVID-19 infection.  I personally saw this patient and performed  a substantive portion of this encounter with the listed APP documented above.   Chauncey Cruel, MD Medical Oncology and Hematology [facility] [address] [place], [place] [address] Tel. [phone]    Fax. [phone]    *Total Encounter Time as defined by the Centers for Medicare and Medicaid Services includes, in addition to the face-to-face time of a patient visit (documented in the note above) non-face-to-face time: obtaining and reviewing outside history, ordering and reviewing medications, tests or procedures, care coordination (communications with other health care professionals or caregivers) and documentation in the medical record.

## 2020-04-28 ENCOUNTER — Inpatient Hospital Stay: Payer: Medicare PPO

## 2020-04-28 ENCOUNTER — Other Ambulatory Visit: Payer: Self-pay

## 2020-04-28 ENCOUNTER — Encounter: Payer: Self-pay | Admitting: Oncology

## 2020-04-28 ENCOUNTER — Ambulatory Visit (HOSPITAL_COMMUNITY)
Admission: RE | Admit: 2020-04-28 | Discharge: 2020-04-28 | Disposition: A | Discharge status: Home / Self Care | Payer: Medicare PPO | Source: Ambulatory Visit | Attending: Adult Health | Admitting: Adult Health

## 2020-04-28 ENCOUNTER — Ambulatory Visit: Payer: Medicare PPO | Admitting: Adult Health

## 2020-04-28 ENCOUNTER — Encounter: Payer: Self-pay | Admitting: Adult Health

## 2020-04-28 ENCOUNTER — Ambulatory Visit: Payer: Medicare PPO

## 2020-04-28 ENCOUNTER — Inpatient Hospital Stay (HOSPITAL_BASED_OUTPATIENT_CLINIC_OR_DEPARTMENT_OTHER): Payer: Medicare PPO | Admitting: Adult Health

## 2020-04-28 ENCOUNTER — Other Ambulatory Visit: Payer: Medicare PPO

## 2020-04-28 ENCOUNTER — Inpatient Hospital Stay: Payer: Medicare PPO | Attending: Oncology

## 2020-04-28 ENCOUNTER — Telehealth: Payer: Self-pay | Admitting: Adult Health

## 2020-04-28 VITALS — BP 149/76 | HR 77 | Temp 98.9°F | Resp 17 | Ht 64.5 in | Wt 133.3 lb

## 2020-04-28 DIAGNOSIS — C799 Secondary malignant neoplasm of unspecified site: Secondary | ICD-10-CM | POA: Insufficient documentation

## 2020-04-28 DIAGNOSIS — C169 Malignant neoplasm of stomach, unspecified: Secondary | ICD-10-CM

## 2020-04-28 DIAGNOSIS — C787 Secondary malignant neoplasm of liver and intrahepatic bile duct: Secondary | ICD-10-CM

## 2020-04-28 DIAGNOSIS — J439 Emphysema, unspecified: Secondary | ICD-10-CM | POA: Diagnosis not present

## 2020-04-28 DIAGNOSIS — C168 Malignant neoplasm of overlapping sites of stomach: Secondary | ICD-10-CM | POA: Diagnosis not present

## 2020-04-28 DIAGNOSIS — Z5112 Encounter for antineoplastic immunotherapy: Secondary | ICD-10-CM | POA: Insufficient documentation

## 2020-04-28 DIAGNOSIS — C162 Malignant neoplasm of body of stomach: Secondary | ICD-10-CM | POA: Diagnosis not present

## 2020-04-28 DIAGNOSIS — Z79899 Other long term (current) drug therapy: Secondary | ICD-10-CM | POA: Diagnosis not present

## 2020-04-28 DIAGNOSIS — R918 Other nonspecific abnormal finding of lung field: Secondary | ICD-10-CM | POA: Diagnosis not present

## 2020-04-28 DIAGNOSIS — R05 Cough: Secondary | ICD-10-CM | POA: Diagnosis not present

## 2020-04-28 DIAGNOSIS — Z95828 Presence of other vascular implants and grafts: Secondary | ICD-10-CM

## 2020-04-28 DIAGNOSIS — J90 Pleural effusion, not elsewhere classified: Secondary | ICD-10-CM | POA: Diagnosis not present

## 2020-04-28 DIAGNOSIS — C61 Malignant neoplasm of prostate: Secondary | ICD-10-CM

## 2020-04-28 DIAGNOSIS — D649 Anemia, unspecified: Secondary | ICD-10-CM

## 2020-04-28 DIAGNOSIS — Z7189 Other specified counseling: Secondary | ICD-10-CM

## 2020-04-28 DIAGNOSIS — J189 Pneumonia, unspecified organism: Secondary | ICD-10-CM | POA: Diagnosis not present

## 2020-04-28 LAB — CEA (IN HOUSE-CHCC): CEA (CHCC-In House): 189.98 ng/mL — ABNORMAL HIGH (ref 0.00–5.00)

## 2020-04-28 LAB — CBC WITH DIFFERENTIAL/PLATELET
Abs Immature Granulocytes: 0.02 10*3/uL (ref 0.00–0.07)
Basophils Absolute: 0.1 10*3/uL (ref 0.0–0.1)
Basophils Relative: 2 %
Eosinophils Absolute: 0.2 10*3/uL (ref 0.0–0.5)
Eosinophils Relative: 6 %
HCT: 29.7 % — ABNORMAL LOW (ref 39.0–52.0)
Hemoglobin: 10 g/dL — ABNORMAL LOW (ref 13.0–17.0)
Immature Granulocytes: 1 %
Lymphocytes Relative: 21 %
Lymphs Abs: 0.9 10*3/uL (ref 0.7–4.0)
MCH: 34.6 pg — ABNORMAL HIGH (ref 26.0–34.0)
MCHC: 33.7 g/dL (ref 30.0–36.0)
MCV: 102.8 fL — ABNORMAL HIGH (ref 80.0–100.0)
Monocytes Absolute: 0.6 10*3/uL (ref 0.1–1.0)
Monocytes Relative: 14 %
Neutro Abs: 2.5 10*3/uL (ref 1.7–7.7)
Neutrophils Relative %: 56 %
Platelets: 185 10*3/uL (ref 150–400)
RBC: 2.89 MIL/uL — ABNORMAL LOW (ref 4.22–5.81)
RDW: 14.4 % (ref 11.5–15.5)
WBC: 4.3 10*3/uL (ref 4.0–10.5)
nRBC: 0.5 % — ABNORMAL HIGH (ref 0.0–0.2)

## 2020-04-28 LAB — COMPREHENSIVE METABOLIC PANEL
ALT: 45 U/L — ABNORMAL HIGH (ref 0–44)
AST: 75 U/L — ABNORMAL HIGH (ref 15–41)
Albumin: 2.7 g/dL — ABNORMAL LOW (ref 3.5–5.0)
Alkaline Phosphatase: 341 U/L — ABNORMAL HIGH (ref 38–126)
Anion gap: 8 (ref 5–15)
BUN: 11 mg/dL (ref 8–23)
CO2: 27 mmol/L (ref 22–32)
Calcium: 9.2 mg/dL (ref 8.9–10.3)
Chloride: 103 mmol/L (ref 98–111)
Creatinine, Ser: 0.91 mg/dL (ref 0.61–1.24)
GFR calc Af Amer: >60 mL/min (ref >60)
GFR calc non Af Amer: >60 mL/min (ref >60)
Glucose, Bld: 108 mg/dL — ABNORMAL HIGH (ref 70–99)
Potassium: 3.1 mmol/L — ABNORMAL LOW (ref 3.5–5.1)
Sodium: 138 mmol/L (ref 135–145)
Total Bilirubin: 0.9 mg/dL (ref 0.3–1.2)
Total Protein: 6.2 g/dL — ABNORMAL LOW (ref 6.5–8.1)

## 2020-04-28 LAB — TSH: TSH: 1.121 u[IU]/mL (ref 0.320–4.118)

## 2020-04-28 MED ORDER — PALONOSETRON HCL INJECTION 0.25 MG/5ML
INTRAVENOUS | Status: AC
  Filled 2020-04-28: qty 5

## 2020-04-28 MED ORDER — SODIUM CHLORIDE 0.9% FLUSH
10.0000 mL | Freq: Once | INTRAVENOUS | Status: AC
  Administered 2020-04-28: 10 mL
  Filled 2020-04-28: qty 10

## 2020-04-28 MED ORDER — SODIUM CHLORIDE 0.9 % IV SOLN
10.0000 mg | Freq: Once | INTRAVENOUS | Status: AC
  Administered 2020-04-28: 10 mg via INTRAVENOUS
  Filled 2020-04-28: qty 10

## 2020-04-28 MED ORDER — ACETAMINOPHEN 325 MG PO TABS
650.0000 mg | ORAL_TABLET | Freq: Once | ORAL | Status: AC
  Administered 2020-04-28: 650 mg via ORAL

## 2020-04-28 MED ORDER — DIPHENHYDRAMINE HCL 25 MG PO CAPS
ORAL_CAPSULE | ORAL | Status: AC
  Filled 2020-04-28: qty 1

## 2020-04-28 MED ORDER — DEXTROSE 5 % IV SOLN
Freq: Once | INTRAVENOUS | Status: AC
  Filled 2020-04-28: qty 250

## 2020-04-28 MED ORDER — PALONOSETRON HCL INJECTION 0.25 MG/5ML
0.2500 mg | Freq: Once | INTRAVENOUS | Status: AC
  Administered 2020-04-28: 0.25 mg via INTRAVENOUS

## 2020-04-28 MED ORDER — DIPHENHYDRAMINE HCL 25 MG PO CAPS
25.0000 mg | ORAL_CAPSULE | Freq: Once | ORAL | Status: AC
  Administered 2020-04-28: 25 mg via ORAL

## 2020-04-28 MED ORDER — ACETAMINOPHEN 325 MG PO TABS
ORAL_TABLET | ORAL | Status: AC
  Filled 2020-04-28: qty 2

## 2020-04-28 MED ORDER — SODIUM CHLORIDE 0.9% FLUSH
10.0000 mL | INTRAVENOUS | Status: DC | PRN
  Administered 2020-04-28: 10 mL
  Filled 2020-04-28: qty 10

## 2020-04-28 MED ORDER — FAM-TRASTUZUMAB DERUXTECAN-NXKI CHEMO 100 MG IV SOLR
6.1500 mg/kg | Freq: Once | INTRAVENOUS | Status: AC
  Administered 2020-04-28: 400 mg via INTRAVENOUS
  Filled 2020-04-28: qty 20

## 2020-04-28 MED ORDER — HEPARIN SOD (PORK) LOCK FLUSH 100 UNIT/ML IV SOLN
500.0000 [IU] | Freq: Once | INTRAVENOUS | Status: AC | PRN
  Administered 2020-04-28: 500 [IU]
  Filled 2020-04-28: qty 5

## 2020-04-28 NOTE — Patient Instructions (Signed)

## 2020-04-28 NOTE — Patient Instructions (Signed)
[facility] Discharge Instructions for Patients Receiving Chemotherapy  Today you received the following chemotherapy agents: Enhertu  To help prevent nausea and vomiting after your treatment, we encourage you to take your nausea medication as directed.   If you develop nausea and vomiting that is not controlled by your nausea medication, call the clinic.   BELOW ARE SYMPTOMS THAT SHOULD BE REPORTED IMMEDIATELY:  *FEVER GREATER THAN 100.5 F  *CHILLS WITH OR WITHOUT FEVER  NAUSEA AND VOMITING THAT IS NOT CONTROLLED WITH YOUR NAUSEA MEDICATION  *UNUSUAL SHORTNESS OF BREATH  *UNUSUAL BRUISING OR BLEEDING  TENDERNESS IN MOUTH AND THROAT WITH OR WITHOUT PRESENCE OF ULCERS  *URINARY PROBLEMS  *BOWEL PROBLEMS  UNUSUAL RASH Items with * indicate a potential emergency and should be followed up as soon as possible.  Feel free to call the clinic should you have any questions or concerns. The clinic phone number is [phone].  Please show the CHEMO ALERT CARD at check-in to the Emergency Department and triage nurse.   

## 2020-04-28 NOTE — Telephone Encounter (Signed)
Scheduled appts per 8/18 los. Gave pt a print out of AVS.

## 2020-04-29 ENCOUNTER — Encounter: Payer: Self-pay | Admitting: Adult Health

## 2020-04-29 ENCOUNTER — Other Ambulatory Visit: Payer: Self-pay | Admitting: Adult Health

## 2020-04-29 ENCOUNTER — Ambulatory Visit (HOSPITAL_COMMUNITY)
Admission: RE | Admit: 2020-04-29 | Discharge: 2020-04-29 | Disposition: A | Discharge status: Home / Self Care | Payer: Medicare Other | Source: Ambulatory Visit | Attending: Pulmonary Disease | Admitting: Pulmonary Disease

## 2020-04-29 ENCOUNTER — Other Ambulatory Visit: Payer: Self-pay | Admitting: Infectious Diseases

## 2020-04-29 DIAGNOSIS — C169 Malignant neoplasm of stomach, unspecified: Secondary | ICD-10-CM

## 2020-04-29 DIAGNOSIS — U071 COVID-19: Secondary | ICD-10-CM

## 2020-04-29 DIAGNOSIS — C799 Secondary malignant neoplasm of unspecified site: Secondary | ICD-10-CM | POA: Insufficient documentation

## 2020-04-29 DIAGNOSIS — C787 Secondary malignant neoplasm of liver and intrahepatic bile duct: Secondary | ICD-10-CM

## 2020-04-29 DIAGNOSIS — Z23 Encounter for immunization: Secondary | ICD-10-CM | POA: Diagnosis not present

## 2020-04-29 LAB — SARS CORONAVIRUS 2 BY RT PCR (HOSPITAL ORDER, PERFORMED IN ~~LOC~~ HOSPITAL LAB): SARS Coronavirus 2: NEGATIVE

## 2020-04-29 LAB — CANCER ANTIGEN 19-9: CA 19-9: 84 U/mL — ABNORMAL HIGH (ref 0–35)

## 2020-04-29 MED ORDER — ALBUTEROL SULFATE HFA 108 (90 BASE) MCG/ACT IN AERS: 2.0000 | INHALATION_SPRAY | Freq: Once | RESPIRATORY_TRACT | Status: DC | PRN

## 2020-04-29 MED ORDER — DIPHENHYDRAMINE HCL 50 MG/ML IJ SOLN: 50.0000 mg | Freq: Once | INTRAMUSCULAR | Status: DC | PRN

## 2020-04-29 MED ORDER — SODIUM CHLORIDE 0.9 % IV SOLN
1200.0000 mg | Freq: Once | INTRAVENOUS | Status: AC
  Administered 2020-04-29: 1200 mg via INTRAVENOUS
  Filled 2020-04-29: qty 10

## 2020-04-29 MED ORDER — FAMOTIDINE IN NACL 20-0.9 MG/50ML-% IV SOLN: 20.0000 mg | Freq: Once | INTRAVENOUS | Status: DC | PRN

## 2020-04-29 MED ORDER — HEPARIN SOD (PORK) LOCK FLUSH 100 UNIT/ML IV SOLN: 500.0000 [IU] | Freq: Once | INTRAVENOUS | Status: DC

## 2020-04-29 MED ORDER — SODIUM CHLORIDE 0.9 % IV SOLN: 1200.0000 mg | Freq: Once | INTRAVENOUS | Status: DC

## 2020-04-29 MED ORDER — SODIUM CHLORIDE 0.9 % IV SOLN: INTRAVENOUS | Status: DC | PRN

## 2020-04-29 MED ORDER — EPINEPHRINE 0.3 MG/0.3ML IJ SOAJ: 0.3000 mg | Freq: Once | INTRAMUSCULAR | Status: DC | PRN

## 2020-04-29 MED ORDER — METHYLPREDNISOLONE SODIUM SUCC 125 MG IJ SOLR: 125.0000 mg | Freq: Once | INTRAMUSCULAR | Status: DC | PRN

## 2020-04-29 NOTE — Progress Notes (Signed)
I connected by phone with Peter Wood on 04/29/2020 at 8:08 AM to discuss the potential use of a new treatment for mild to moderate COVID-19 viral infection in non-hospitalized patients.  This patient is a 85 y.o. male that meets the FDA criteria for Emergency Use Authorization of COVID monoclonal antibody casirivimab/imdevimab.  Has a (+) direct SARS-CoV-2 viral test result  Has mild or moderate COVID-19   Is NOT hospitalized due to COVID-19  Is within 10 days of symptom onset  Has at least one of the high risk factor(s) for progression to severe COVID-19 and/or hospitalization as defined in EUA.  Specific high risk criteria : Immunosuppressive Disease or Treatment   I have spoken and communicated the following to the patient or parent/caregiver regarding COVID monoclonal antibody treatment:  1. FDA has authorized the emergency use for the treatment of mild to moderate COVID-19 in adults and pediatric patients with positive results of direct SARS-CoV-2 viral testing who are 43 years of age and older weighing at least 40 kg, and who are at high risk for progressing to severe COVID-19 and/or hospitalization.  2. The significant known and potential risks and benefits of COVID monoclonal antibody, and the extent to which such potential risks and benefits are unknown.  3. Information on available alternative treatments and the risks and benefits of those alternatives, including clinical trials.  4. Patients treated with COVID monoclonal antibody should continue to self-isolate and use infection control measures (e.g., wear mask, isolate, social distance, avoid sharing personal items, clean and disinfect "high touch" surfaces, and frequent handwashing) according to CDC guidelines.   5. The patient or parent/caregiver has the option to accept or refuse COVID monoclonal antibody treatment.  After reviewing this information with the patient, The patient agreed to proceed with receiving  casirivimab\imdevimab infusion and will be provided a copy of the Fact sheet prior to receiving the infusion. Scot Dock 04/29/2020 8:08 AM

## 2020-04-29 NOTE — Discharge Instructions (Signed)

## 2020-04-29 NOTE — Addendum Note (Signed)
Addended by: Asencion Noble E on: 04/29/2020 11:16 AM   Modules accepted: Orders

## 2020-04-29 NOTE — Progress Notes (Signed)
  Diagnosis: COVID-19  Physician:Dr Joya Gaskins  Procedure: [facility] Med: casirivimab\imdevimab infusion - Provided patient with casirivimab\imdevimab fact sheet for patients, parents and caregivers prior to infusion.  Complications: No immediate complications noted.  Discharge: Discharged home   Sayner, Bethany 04/29/2020

## 2020-04-29 NOTE — Progress Notes (Signed)
I connected by phone with [name] on 04/29/2020 at 12:52 PM to discuss the potential use of a new treatment for mild to moderate COVID-19 viral infection in non-hospitalized patients.  This patient is a 85 y.o. male that meets the FDA criteria for Emergency Use Authorization of COVID monoclonal antibody casirivimab/imdevimab.  Has a (+) direct SARS-CoV-2 viral test result  Has mild or moderate COVID-19   Is NOT hospitalized due to COVID-19  Is within 10 days of symptom onset  Has at least one of the high risk factor(s) for progression to severe COVID-19 and/or hospitalization as defined in EUA.  Specific high risk criteria : Immunosuppressive Disease or Treatment   I have spoken and communicated the following to the patient or parent/caregiver regarding COVID monoclonal antibody treatment:  1. FDA has authorized the emergency use for the treatment of high risk post-exposure prophylaxis for COVID19.   2. The significant known and potential risks and benefits of COVID monoclonal antibody, and the extent to which such potential risks and benefits are unknown.  3. Information on available alternative treatments and the risks and benefits of those alternatives, including clinical trials.  4. Patients treated with COVID monoclonal antibody should continue to self-isolate and use infection control measures (e.g., wear mask, isolate, social distance, avoid sharing personal items, clean and disinfect "high touch" surfaces, and frequent handwashing) according to CDC guidelines.   5. The patient or parent/caregiver has the option to accept or refuse COVID monoclonal antibody treatment.  After reviewing this information with the patient, The patient agreed to proceed with receiving casirivimab\imdevimab infusion and will be provided a copy of the Fact sheet prior to receiving the infusion. Janene Madeira 04/29/2020 12:52 PM

## 2020-04-30 ENCOUNTER — Telehealth: Payer: Self-pay | Admitting: *Deleted

## 2020-04-30 NOTE — Telephone Encounter (Signed)
This RN spoke with Peter Wood per his call wanting the MD and NP know he received the antibiotic infusion ( monoclonal ) yesterday " and they called me late that evening to let me know my Corona test came back negative ".  Nas states he is overall feeling well " was told to take it easy today " and appreciated the care he has been receiving by everyone.  No further needs at this time.  This message will be forwarded to MD and NP for communication of call.

## 2020-05-03 ENCOUNTER — Telehealth: Payer: Self-pay | Admitting: Oncology

## 2020-05-03 NOTE — Telephone Encounter (Signed)
Scheduled appts per 8/18 los. Pt confirmed appt date and time.

## 2020-05-18 ENCOUNTER — Telehealth: Payer: Self-pay | Admitting: *Deleted

## 2020-05-18 MED ORDER — BENZONATATE 100 MG PO CAPS: 100.0000 mg | ORAL_CAPSULE | Freq: Two times a day (BID) | ORAL | 0 refills | Status: AC | PRN

## 2020-05-18 MED ORDER — AZITHROMYCIN 250 MG PO TABS: ORAL_TABLET | ORAL | 0 refills | Status: DC

## 2020-05-18 NOTE — Telephone Encounter (Signed)
This RN spoke with Mrs Rizzo per concern that pt had temp this weekend of 100 - this am it is 99.  Saad continues with cough ( productive colored sputum ).  " the last time he was like this you gave him that antibiotic and it seemed to help - wondering if he could have it again ?"  Note pt had Covid 19 antibody infusion per symptoms with negative Covid test due to cough, immunocompromised and low grade temp non 04/28/2020.  This note will be reviewed with a covering provider for further recommendation.

## 2020-05-18 NOTE — Telephone Encounter (Signed)
Per review with LCC/NP- prescriptions antibiotic and new cough medication sent to verified pharmacy per discussion with pt's wife.

## 2020-05-24 ENCOUNTER — Telehealth: Payer: Self-pay | Admitting: *Deleted

## 2020-05-24 ENCOUNTER — Other Ambulatory Visit: Payer: Self-pay | Admitting: *Deleted

## 2020-05-24 ENCOUNTER — Other Ambulatory Visit: Payer: Self-pay | Admitting: Oncology

## 2020-05-24 MED ORDER — PROMETHAZINE-CODEINE 6.25-10 MG/5ML PO SYRP
5.0000 mL | ORAL_SOLUTION | Freq: Three times a day (TID) | ORAL | 0 refills | Status: DC | PRN
Start: 2020-05-24 — End: 2020-06-06

## 2020-05-24 NOTE — Telephone Encounter (Signed)
This RN spoke with Mrs Sgro per her call stating Matis completed the antibiotic and is still having significant coughing and low grade temp ( 100.2).  Pt is using the tessalon perles with some benefit but also they are causing dizziness - so he is not using a lot.  Per review with MD- recommendation is for new cough med and follow up as scheduled on Wednesday.

## 2020-05-25 ENCOUNTER — Inpatient Hospital Stay: Payer: Medicare PPO

## 2020-05-25 ENCOUNTER — Encounter (HOSPITAL_COMMUNITY): Payer: Self-pay | Admitting: Student

## 2020-05-25 ENCOUNTER — Ambulatory Visit (HOSPITAL_COMMUNITY)
Admission: RE | Admit: 2020-05-25 | Discharge: 2020-05-25 | Disposition: A | Discharge status: Home / Self Care | Payer: Medicare PPO | Source: Ambulatory Visit | Attending: Oncology | Admitting: Oncology

## 2020-05-25 ENCOUNTER — Inpatient Hospital Stay (HOSPITAL_COMMUNITY)
Admission: EM | Admit: 2020-05-25 | Discharge: 2020-06-06 | DRG: 193 | Disposition: A | Discharge status: Hospice | Payer: Medicare PPO | Attending: Family Medicine | Admitting: Family Medicine

## 2020-05-25 ENCOUNTER — Telehealth: Payer: Self-pay | Admitting: *Deleted

## 2020-05-25 ENCOUNTER — Other Ambulatory Visit: Payer: Self-pay

## 2020-05-25 ENCOUNTER — Emergency Department (HOSPITAL_COMMUNITY): Payer: Medicare PPO

## 2020-05-25 ENCOUNTER — Inpatient Hospital Stay: Payer: Medicare PPO | Attending: Oncology | Admitting: Medical

## 2020-05-25 VITALS — BP 122/84 | HR 100 | Temp 99.0°F | Resp 18

## 2020-05-25 DIAGNOSIS — Z86711 Personal history of pulmonary embolism: Secondary | ICD-10-CM | POA: Diagnosis not present

## 2020-05-25 DIAGNOSIS — Z95828 Presence of other vascular implants and grafts: Secondary | ICD-10-CM

## 2020-05-25 DIAGNOSIS — J069 Acute upper respiratory infection, unspecified: Secondary | ICD-10-CM | POA: Diagnosis not present

## 2020-05-25 DIAGNOSIS — Z515 Encounter for palliative care: Secondary | ICD-10-CM

## 2020-05-25 DIAGNOSIS — R7989 Other specified abnormal findings of blood chemistry: Secondary | ICD-10-CM | POA: Diagnosis not present

## 2020-05-25 DIAGNOSIS — M1990 Unspecified osteoarthritis, unspecified site: Secondary | ICD-10-CM | POA: Diagnosis present

## 2020-05-25 DIAGNOSIS — R0902 Hypoxemia: Secondary | ICD-10-CM | POA: Diagnosis not present

## 2020-05-25 DIAGNOSIS — K5730 Diverticulosis of large intestine without perforation or abscess without bleeding: Secondary | ICD-10-CM | POA: Diagnosis present

## 2020-05-25 DIAGNOSIS — Z6823 Body mass index (BMI) 23.0-23.9, adult: Secondary | ICD-10-CM

## 2020-05-25 DIAGNOSIS — R509 Fever, unspecified: Secondary | ICD-10-CM

## 2020-05-25 DIAGNOSIS — Z85028 Personal history of other malignant neoplasm of stomach: Secondary | ICD-10-CM | POA: Diagnosis not present

## 2020-05-25 DIAGNOSIS — I110 Hypertensive heart disease with heart failure: Secondary | ICD-10-CM | POA: Diagnosis present

## 2020-05-25 DIAGNOSIS — R070 Pain in throat: Secondary | ICD-10-CM | POA: Diagnosis not present

## 2020-05-25 DIAGNOSIS — Z803 Family history of malignant neoplasm of breast: Secondary | ICD-10-CM | POA: Diagnosis not present

## 2020-05-25 DIAGNOSIS — D638 Anemia in other chronic diseases classified elsewhere: Secondary | ICD-10-CM | POA: Diagnosis present

## 2020-05-25 DIAGNOSIS — Z8711 Personal history of peptic ulcer disease: Secondary | ICD-10-CM

## 2020-05-25 DIAGNOSIS — J9600 Acute respiratory failure, unspecified whether with hypoxia or hypercapnia: Secondary | ICD-10-CM | POA: Diagnosis not present

## 2020-05-25 DIAGNOSIS — Z79899 Other long term (current) drug therapy: Secondary | ICD-10-CM

## 2020-05-25 DIAGNOSIS — J9601 Acute respiratory failure with hypoxia: Secondary | ICD-10-CM | POA: Diagnosis present

## 2020-05-25 DIAGNOSIS — Z7952 Long term (current) use of systemic steroids: Secondary | ICD-10-CM

## 2020-05-25 DIAGNOSIS — R059 Cough, unspecified: Secondary | ICD-10-CM

## 2020-05-25 DIAGNOSIS — J189 Pneumonia, unspecified organism: Secondary | ICD-10-CM | POA: Diagnosis present

## 2020-05-25 DIAGNOSIS — Z8249 Family history of ischemic heart disease and other diseases of the circulatory system: Secondary | ICD-10-CM

## 2020-05-25 DIAGNOSIS — C168 Malignant neoplasm of overlapping sites of stomach: Secondary | ICD-10-CM

## 2020-05-25 DIAGNOSIS — E8809 Other disorders of plasma-protein metabolism, not elsewhere classified: Secondary | ICD-10-CM | POA: Diagnosis not present

## 2020-05-25 DIAGNOSIS — R0602 Shortness of breath: Secondary | ICD-10-CM | POA: Diagnosis not present

## 2020-05-25 DIAGNOSIS — Z7901 Long term (current) use of anticoagulants: Secondary | ICD-10-CM

## 2020-05-25 DIAGNOSIS — D649 Anemia, unspecified: Secondary | ICD-10-CM

## 2020-05-25 DIAGNOSIS — D6481 Anemia due to antineoplastic chemotherapy: Secondary | ICD-10-CM

## 2020-05-25 DIAGNOSIS — Z8546 Personal history of malignant neoplasm of prostate: Secondary | ICD-10-CM | POA: Diagnosis not present

## 2020-05-25 DIAGNOSIS — Z8371 Family history of colonic polyps: Secondary | ICD-10-CM

## 2020-05-25 DIAGNOSIS — C787 Secondary malignant neoplasm of liver and intrahepatic bile duct: Secondary | ICD-10-CM

## 2020-05-25 DIAGNOSIS — I10 Essential (primary) hypertension: Secondary | ICD-10-CM | POA: Diagnosis not present

## 2020-05-25 DIAGNOSIS — E870 Hyperosmolality and hypernatremia: Secondary | ICD-10-CM | POA: Diagnosis not present

## 2020-05-25 DIAGNOSIS — E43 Unspecified severe protein-calorie malnutrition: Secondary | ICD-10-CM | POA: Diagnosis present

## 2020-05-25 DIAGNOSIS — Z9981 Dependence on supplemental oxygen: Secondary | ICD-10-CM | POA: Diagnosis not present

## 2020-05-25 DIAGNOSIS — C169 Malignant neoplasm of stomach, unspecified: Secondary | ICD-10-CM | POA: Diagnosis present

## 2020-05-25 DIAGNOSIS — Z87891 Personal history of nicotine dependence: Secondary | ICD-10-CM | POA: Insufficient documentation

## 2020-05-25 DIAGNOSIS — I472 Ventricular tachycardia: Secondary | ICD-10-CM | POA: Diagnosis not present

## 2020-05-25 DIAGNOSIS — I2699 Other pulmonary embolism without acute cor pulmonale: Secondary | ICD-10-CM | POA: Diagnosis not present

## 2020-05-25 DIAGNOSIS — J470 Bronchiectasis with acute lower respiratory infection: Secondary | ICD-10-CM | POA: Diagnosis present

## 2020-05-25 DIAGNOSIS — C799 Secondary malignant neoplasm of unspecified site: Secondary | ICD-10-CM

## 2020-05-25 DIAGNOSIS — Z8042 Family history of malignant neoplasm of prostate: Secondary | ICD-10-CM | POA: Diagnosis not present

## 2020-05-25 DIAGNOSIS — D500 Iron deficiency anemia secondary to blood loss (chronic): Secondary | ICD-10-CM | POA: Diagnosis not present

## 2020-05-25 DIAGNOSIS — Z20822 Contact with and (suspected) exposure to covid-19: Secondary | ICD-10-CM | POA: Diagnosis present

## 2020-05-25 DIAGNOSIS — I5042 Chronic combined systolic (congestive) and diastolic (congestive) heart failure: Secondary | ICD-10-CM | POA: Diagnosis present

## 2020-05-25 DIAGNOSIS — J9690 Respiratory failure, unspecified, unspecified whether with hypoxia or hypercapnia: Secondary | ICD-10-CM | POA: Diagnosis not present

## 2020-05-25 DIAGNOSIS — J90 Pleural effusion, not elsewhere classified: Secondary | ICD-10-CM | POA: Diagnosis not present

## 2020-05-25 DIAGNOSIS — U071 COVID-19: Secondary | ICD-10-CM

## 2020-05-25 DIAGNOSIS — C61 Malignant neoplasm of prostate: Secondary | ICD-10-CM

## 2020-05-25 DIAGNOSIS — I5032 Chronic diastolic (congestive) heart failure: Secondary | ICD-10-CM | POA: Diagnosis not present

## 2020-05-25 DIAGNOSIS — Z7189 Other specified counseling: Secondary | ICD-10-CM | POA: Diagnosis not present

## 2020-05-25 DIAGNOSIS — Z9221 Personal history of antineoplastic chemotherapy: Secondary | ICD-10-CM | POA: Diagnosis not present

## 2020-05-25 DIAGNOSIS — R945 Abnormal results of liver function studies: Secondary | ICD-10-CM | POA: Diagnosis not present

## 2020-05-25 DIAGNOSIS — Z7401 Bed confinement status: Secondary | ICD-10-CM

## 2020-05-25 DIAGNOSIS — Z8719 Personal history of other diseases of the digestive system: Secondary | ICD-10-CM

## 2020-05-25 DIAGNOSIS — Z1501 Genetic susceptibility to malignant neoplasm of breast: Secondary | ICD-10-CM

## 2020-05-25 DIAGNOSIS — C7951 Secondary malignant neoplasm of bone: Secondary | ICD-10-CM | POA: Diagnosis present

## 2020-05-25 DIAGNOSIS — R627 Adult failure to thrive: Secondary | ICD-10-CM | POA: Diagnosis present

## 2020-05-25 DIAGNOSIS — Z923 Personal history of irradiation: Secondary | ICD-10-CM

## 2020-05-25 DIAGNOSIS — R0600 Dyspnea, unspecified: Secondary | ICD-10-CM | POA: Diagnosis not present

## 2020-05-25 DIAGNOSIS — R05 Cough: Secondary | ICD-10-CM | POA: Diagnosis not present

## 2020-05-25 HISTORY — DX: Other pulmonary embolism without acute cor pulmonale: I26.99

## 2020-05-25 LAB — TRIGLYCERIDES: Triglycerides: 89 mg/dL (ref <150)

## 2020-05-25 LAB — CBC WITH DIFFERENTIAL (CANCER CENTER ONLY)
Abs Immature Granulocytes: 0.08 10*3/uL — ABNORMAL HIGH (ref 0.00–0.07)
Basophils Absolute: 0.1 10*3/uL (ref 0.0–0.1)
Basophils Relative: 1 %
Eosinophils Absolute: 0 10*3/uL (ref 0.0–0.5)
Eosinophils Relative: 0 %
HCT: 31.3 % — ABNORMAL LOW (ref 39.0–52.0)
Hemoglobin: 10.4 g/dL — ABNORMAL LOW (ref 13.0–17.0)
Immature Granulocytes: 1 %
Lymphocytes Relative: 11 %
Lymphs Abs: 1.1 10*3/uL (ref 0.7–4.0)
MCH: 33.8 pg (ref 26.0–34.0)
MCHC: 33.2 g/dL (ref 30.0–36.0)
MCV: 101.6 fL — ABNORMAL HIGH (ref 80.0–100.0)
Monocytes Absolute: 0.9 10*3/uL (ref 0.1–1.0)
Monocytes Relative: 9 %
Neutro Abs: 7.8 10*3/uL — ABNORMAL HIGH (ref 1.7–7.7)
Neutrophils Relative %: 78 %
Platelet Count: 318 10*3/uL (ref 150–400)
RBC: 3.08 MIL/uL — ABNORMAL LOW (ref 4.22–5.81)
RDW: 15.3 % (ref 11.5–15.5)
WBC Count: 10 10*3/uL (ref 4.0–10.5)
nRBC: 0.3 % — ABNORMAL HIGH (ref 0.0–0.2)

## 2020-05-25 LAB — C-REACTIVE PROTEIN: CRP: 17.8 mg/dL — ABNORMAL HIGH (ref <1.0)

## 2020-05-25 LAB — CMP (CANCER CENTER ONLY)
ALT: 86 U/L — ABNORMAL HIGH (ref 0–44)
AST: 116 U/L — ABNORMAL HIGH (ref 15–41)
Albumin: 2.2 g/dL — ABNORMAL LOW (ref 3.5–5.0)
Alkaline Phosphatase: 533 U/L — ABNORMAL HIGH (ref 38–126)
Anion gap: 8 (ref 5–15)
BUN: 16 mg/dL (ref 8–23)
CO2: 29 mmol/L (ref 22–32)
Calcium: 9.1 mg/dL (ref 8.9–10.3)
Chloride: 96 mmol/L — ABNORMAL LOW (ref 98–111)
Creatinine: 0.8 mg/dL (ref 0.61–1.24)
GFR, Est AFR Am: >60 mL/min
GFR, Estimated: >60 mL/min
Glucose, Bld: 94 mg/dL (ref 70–99)
Potassium: 4.1 mmol/L (ref 3.5–5.1)
Sodium: 133 mmol/L — ABNORMAL LOW (ref 135–145)
Total Bilirubin: 1.1 mg/dL (ref 0.3–1.2)
Total Protein: 6.8 g/dL (ref 6.5–8.1)

## 2020-05-25 LAB — FERRITIN: Ferritin: 1013 ng/mL — ABNORMAL HIGH (ref 24–336)

## 2020-05-25 LAB — PROCALCITONIN: Procalcitonin: 1.96 ng/mL

## 2020-05-25 LAB — LACTATE DEHYDROGENASE: LDH: 313 U/L — ABNORMAL HIGH (ref 98–192)

## 2020-05-25 LAB — LACTIC ACID, PLASMA
Lactic Acid, Venous: 1.4 mmol/L (ref 0.5–1.9)
Lactic Acid, Venous: 1.3 mmol/L (ref 0.5–1.9)

## 2020-05-25 LAB — TROPONIN I (HIGH SENSITIVITY)
Troponin I (High Sensitivity): 19 ng/L — ABNORMAL HIGH (ref <18)
Troponin I (High Sensitivity): 17 ng/L (ref <18)

## 2020-05-25 LAB — SARS CORONAVIRUS 2 BY RT PCR (HOSPITAL ORDER, PERFORMED IN ~~LOC~~ HOSPITAL LAB): SARS Coronavirus 2: NEGATIVE

## 2020-05-25 LAB — BRAIN NATRIURETIC PEPTIDE: B Natriuretic Peptide: 54.7 pg/mL (ref 0.0–100.0)

## 2020-05-25 LAB — D-DIMER, QUANTITATIVE: D-Dimer, Quant: >20 ug/mL-FEU — ABNORMAL HIGH (ref 0.00–0.50)

## 2020-05-25 LAB — FIBRINOGEN: Fibrinogen: 634 mg/dL — ABNORMAL HIGH (ref 210–475)

## 2020-05-25 MED ORDER — SODIUM CHLORIDE 0.9 % IV SOLN
3.0000 g | Freq: Once | INTRAVENOUS | Status: AC
  Administered 2020-05-25: 3 g via INTRAVENOUS
  Filled 2020-05-25: qty 8

## 2020-05-25 MED ORDER — VANCOMYCIN HCL 1250 MG/250ML IV SOLN
1250.0000 mg | INTRAVENOUS | Status: AC
  Administered 2020-05-25: 1250 mg via INTRAVENOUS
  Filled 2020-05-25: qty 250

## 2020-05-25 MED ORDER — SODIUM CHLORIDE 0.9% FLUSH
10.0000 mL | Freq: Once | INTRAVENOUS | Status: AC
  Administered 2020-05-25: 10 mL
  Filled 2020-05-25: qty 10

## 2020-05-25 MED ORDER — IOHEXOL 350 MG/ML SOLN
100.0000 mL | Freq: Once | INTRAVENOUS | Status: AC | PRN
  Administered 2020-05-25: 50 mL via INTRAVENOUS

## 2020-05-25 MED FILL — Dexamethasone Sodium Phosphate Inj 100 MG/10ML: INTRAMUSCULAR | Qty: 1 | Status: AC

## 2020-05-25 NOTE — ED Provider Notes (Signed)
Emergency Department Provider Note   I have reviewed the triage vital signs and the nursing notes.   HISTORY  Chief Complaint Shortness of Breath, Fever, Leg Swelling, and Cough   HPI Peter Wood is a 85 y.o. male with PMH reviewed including active stomach cancer on chemotherapy presents to the ED with fever, chills, SOB, and dizziness. Symptoms are worsening over the last 3 weeks. He has been followed and managed primarily by his oncology team. He initially had COVID testing and labs along with CXR in mid/late August. CXR was suspicious for COVID although the PCR was negative. He was given MAB infusion but symptoms continued. He was started on Azithromycin during the first week of September with no relief. The patient's wife reports worsening symptoms since and after office visit today he was directed to the ED for admission and possible bronchoscopy. No CP. No abdominal pain, vomiting, or diarrhea. No radiation of symptoms or modifying factors.    Past Medical History:  Diagnosis Date  . Arthritis   . Diverticulosis of colon   . Family history of breast cancer   . Family history of prostate cancer   . HTN (hypertension)   . Hx of colonic polyp   . Pneumonia    as a child/baby  . Poor circulation of extremity    right leg  . Prostate cancer [facility]) 2015   prostate cancer - radiation treated    Patient Active Problem List   Diagnosis Date Noted  . Multifocal pneumonia 05/26/2020  . Hypoalbuminemia 05/26/2020  . Abnormal LFTs 05/26/2020  . Iron deficiency anemia due to chronic blood loss 12/26/2019  . Port-A-Cath in place 12/18/2018  . Anemia 11/12/2018  . Anemia due to antineoplastic chemotherapy 11/06/2018  . Genetic testing 10/14/2018  . 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 Gastric cancer ([place]) 10/04/2018  . Family history of prostate cancer   . Family history of breast cancer   . Aortic atherosclerosis ([place]) 10/03/2018  . Acute  pulmonary embolus ([place]) 09/20/2018  . Pulmonary embolism ([place]) 09/19/2018  . Liver metastases ([place]) 09/19/2018  . Abdominal neoplasm without bowel obstruction 09/19/2018  . Nausea 09/19/2018  . Abdominal pain 09/19/2018  . Upper respiratory infection 08/01/2018  . Varicose vein of leg 07/22/2018  . Weight loss 01/18/2018  . Claudication ([place]) 01/16/2017  . Macular hole, right 09/19/2016  . Macular hole, right eye 09/19/2016  . Right sciatic nerve pain 07/19/2016  . Finger mass, right 06/24/2015  . Prostate cancer ([place]) 09/16/2013  . Rash and nonspecific skin eruption 02/12/2013  . Goals of care, counseling/discussion 09/01/2011  . TOBACCO USE, QUIT 08/25/2009  . ERECTILE DYSFUNCTION 03/03/2009  . PSA, INCREASED 03/03/2009  . Essential hypertension 06/10/2007  . DIVERTICULOSIS, COLON 06/10/2007  . COLONIC POLYPS, HX OF 06/10/2007    Past Surgical History:  Procedure Laterality Date  . [place] VITRECTOMY WITH 20 GAUGE MVR PORT FOR MACULAR HOLE Right 09/19/2016   Procedure: 25 GAUGE PARS PLANA VITRECTOMY WITH 20 GAUGE MVR PORT FOR MACULAR HOLE, MEMBRANE PEEL, SERUM PATCH, GAS FLUID EXCHANGE, HEADSCOPE LASER;  Surgeon: [name], MD;  Location: [place];  Service: Ophthalmology;  Laterality: Right;  . BIOPSY  09/20/2018   Procedure: BIOPSY;  Surgeon: Gatha Mayer, MD;  Location: [facility] ENDOSCOPY;  Service: Endoscopy;;  . CATARACT EXTRACTION  08/27/12   Right  . COLONOSCOPY    . ESOPHAGOGASTRODUODENOSCOPY (EGD) WITH PROPOFOL N/A 09/20/2018   Procedure: ESOPHAGOGASTRODUODENOSCOPY (EGD) WITH  PROPOFOL;  Surgeon: Gatha Mayer, MD;  Location: [facility] ENDOSCOPY;  Service: Endoscopy;  Laterality: N/A;  . HAND SURGERY  2016   right thumb  . IR IMAGING GUIDED PORT INSERTION  11/04/2018  . KNEE SURGERY Left 1989    Allergies Patient has no known allergies.  Family History  Problem Relation Age of Onset  . Prostate cancer Father        dx >50  . Hypertension Mother   . Ulcers  Mother   . Breast cancer Sister        dx 20's  . Prostate cancer Brother        dx under 55, metasttic, cause of his death  . Ulcers Brother   . Ulcers Sister     Social History Social History   Tobacco Use  . Smoking status: Former Smoker    Years: 4.00    Quit date: 09/12/1967    Years since quitting: 52.7  . Smokeless tobacco: Never Used  Vaping Use  . Vaping Use: Never used  Substance Use Topics  . Alcohol use: No  . Drug use: No    Review of Systems  Constitutional: Intermittent fever/chills Eyes: No visual changes. ENT: No sore throat. Cardiovascular: Denies chest pain. Respiratory: Positive shortness of breath and cough.  Gastrointestinal: No abdominal pain.  No nausea, no vomiting.  No diarrhea.  No constipation. Genitourinary: Negative for dysuria. Musculoskeletal: Negative for back pain. Skin: Negative for rash. Neurological: Negative for headaches, focal weakness or numbness.  10-point ROS otherwise negative.  ____________________________________________   PHYSICAL EXAM:  VITAL SIGNS: ED Triage Vitals  Enc Vitals Group     BP 05/25/20 1739 132/89     Pulse Rate 05/25/20 1739 94     Resp 05/25/20 1739 (!) 25     Temp 05/25/20 1739 99.2 F (37.3 C)     Temp Source 05/25/20 1739 Oral     SpO2 05/25/20 1739 95 %     Weight 05/25/20 1744 135 lb (61.2 kg)     Height 05/25/20 1744 5\' 4"  (1.626 m)   Constitutional: Alert and oriented. Well appearing and in no acute distress. Eyes: Conjunctivae are normal.  Head: Atraumatic. Nose: No congestion/rhinnorhea. Mouth/Throat: Mucous membranes are moist.   Neck: No stridor.  Cardiovascular: Normal rate, regular rhythm. Good peripheral circulation. Grossly normal heart sounds.   Respiratory: Normal respiratory effort.  No retractions. Lungs CTAB. Gastrointestinal: Soft and nontender. No distention.  Musculoskeletal: No lower extremity tenderness with 2+ pitting edema bilaterally. No gross deformities of  extremities. Neurologic:  Normal speech and language. No gross focal neurologic deficits are appreciated.  Skin:  Skin is warm, dry and intact. No rash noted.  ____________________________________________   LABS (all labs ordered are listed, but only abnormal results are displayed)  Labs Reviewed  D-DIMER, QUANTITATIVE (NOT AT [facility]) - Abnormal; Notable for the following components:      Result Value   D-Dimer, Quant >20.00 (*)    All other components within normal limits  LACTATE DEHYDROGENASE - Abnormal; Notable for the following components:   LDH 313 (*)    All other components within normal limits  FERRITIN - Abnormal; Notable for the following components:   Ferritin 1,013 (*)    All other components within normal limits  FIBRINOGEN - Abnormal; Notable for the following components:   Fibrinogen 634 (*)    All other components within normal limits  C-REACTIVE PROTEIN - Abnormal; Notable for the following components:   CRP 17.8 (*)  All other components within normal limits  APTT - Abnormal; Notable for the following components:   aPTT 42 (*)    All other components within normal limits  HEPARIN LEVEL (UNFRACTIONATED) - Abnormal; Notable for the following components:   Heparin Unfractionated 0.76 (*)    All other components within normal limits  COMPREHENSIVE METABOLIC PANEL - Abnormal; Notable for the following components:   Calcium 8.3 (*)    Total Protein 6.1 (*)    Albumin 2.2 (*)    AST 79 (*)    ALT 63 (*)    Alkaline Phosphatase 386 (*)    All other components within normal limits  CBC - Abnormal; Notable for the following components:   WBC 11.4 (*)    RBC 2.84 (*)    Hemoglobin 9.7 (*)    HCT 29.6 (*)    MCV 104.2 (*)    MCH 34.2 (*)    All other components within normal limits  PROTIME-INR - Abnormal; Notable for the following components:   Prothrombin Time 17.8 (*)    INR 1.5 (*)    All other components within normal limits  TROPONIN I (HIGH  SENSITIVITY) - Abnormal; Notable for the following components:   Troponin I (High Sensitivity) 19 (*)    All other components within normal limits  SARS CORONAVIRUS 2 BY RT PCR (HOSPITAL ORDER, [facility])  MRSA PCR SCREENING  BRAIN NATRIURETIC PEPTIDE  LACTIC ACID, PLASMA  LACTIC ACID, PLASMA  PROCALCITONIN  TRIGLYCERIDES  APTT  TROPONIN I (HIGH SENSITIVITY)   ____________________________________________  EKG   EKG Interpretation  Date/Time:  Tuesday May 25 2020 17:37:20 EDT Ventricular Rate:  95 PR Interval:    QRS Duration: 69 QT Interval:  321 QTC Calculation: 404 R Axis:   38 Text Interpretation: Sinus tachycardia Paired ventricular premature complexes Probable left atrial enlargement No STEMI Confirmed by Nanda Quinton [phone]) on 05/25/2020 6:07:17 PM       ____________________________________________  RADIOLOGY  DG Chest 2 View  Result Date: 05/25/2020 CLINICAL DATA:  Cough and fever.  Reported known prostate carcinoma EXAM: CHEST - 2 VIEW COMPARISON:  April 28, 2020 FINDINGS: There is widespread multifocal airspace opacity throughout the lungs bilaterally, most notably in the right upper lobe. There is an equivocal right pleural effusion. Heart is upper normal in size with pulmonary vascularity normal. Port-A-Cath tip is in the superior vena cava. No appreciable adenopathy. No blastic or lytic bone lesions are appreciable by radiography. IMPRESSION: Widespread airspace opacity bilaterally, more severe on the right than on the left. Suspect atypical organism pneumonia given this overall appearance. Lymphangitic spread of tumor conceivably could present in this manner but is felt to be less likely given this overall appearance. No well-defined nodular opacities noted within the lungs. Port-A-Cath tip in superior vena cava. No adenopathy evident by radiography. Heart upper normal in size. These results will be called to the ordering clinician  or representative by the Radiologist Assistant, and communication documented in the PACS or [employer]. Electronically Signed   By: Lowella Grip III M.D.   On: 05/25/2020 15:29   CT Angio Chest PE W and/or Wo Contrast  Result Date: 05/25/2020 CLINICAL DATA:  Dyspnea, fevers, elevated D-dimer EXAM: CT ANGIOGRAPHY CHEST WITH CONTRAST TECHNIQUE: Multidetector CT imaging of the chest was performed using the standard protocol during bolus administration of intravenous contrast. Multiplanar CT image reconstructions and MIPs were obtained to evaluate the vascular anatomy. CONTRAST:  75mL OMNIPAQUE IOHEXOL 350 MG/ML  SOLN COMPARISON:  None. FINDINGS: Cardiovascular: Right internal jugular chest port tip noted within the right atrium. Global cardiac size within normal limits. No significant coronary artery calcification. Small simple appearing pericardial effusion without CT evidence of cardiac tamponade. Central pulmonary arterial enlargement in keeping with changes of pulmonary artery hypertension. There is excellent opacification of the central pulmonary arterial tree without evidence of filling defect to suggest acute pulmonary embolism. The thoracic aorta is unremarkable. Mediastinum/Nodes: No pathologic thoracic adenopathy. Lungs/Pleura: There are extensive bilateral asymmetric airspace infiltrates demonstrating an upper and mid lung zone predominance most in keeping with changes of atypical infection in the acute setting. Small right pleural effusion is present. No pneumothorax. The central airways are widely patent. Upper Abdomen: Low-attenuation lesion is seen within the left hepatic lobe, poorly characterized on this examination. Musculoskeletal: No chest wall abnormality. No acute or significant osseous findings. Review of the MIP images confirms the above findings. IMPRESSION: 1. No evidence of acute pulmonary embolism. 2. Extensive bilateral asymmetric airspace infiltrates demonstrating an upper  and mid lung zone predominance most in keeping with changes of atypical infection in the acute setting. Serologic correlation for COVID-19 pneumonia may be helpful for further evaluation. 3. Small right pleural effusion. 4. Small simple appearing pericardial effusion without CT evidence of cardiac tamponade. 5. Central pulmonary arterial enlargement in keeping with changes of pulmonary artery hypertension. Electronically Signed   By: Fidela Salisbury MD   On: 05/25/2020 22:05    ____________________________________________   PROCEDURES  Procedure(s) performed:   Procedures  None ____________________________________________   INITIAL IMPRESSION / ASSESSMENT AND PLAN / ED COURSE  Pertinent labs & imaging results that were available during my care of the patient were reviewed by me and considered in my medical decision making (see chart for details).   Patient arrives to the emergency department for evaluation of continued shortness of breath with intermittent fever.  He has multifocal pneumonia on outpatient chest x-ray today.  He had CBC, CMP, blood cultures including one culture drawn from his port at his oncologist office prior to ED presentation.  I have added additional blood work including Covid inflammatory markers.  We will repeat Covid PCR testing as he has not been tested since mid/late August.  Recently completed azithromycin course with no relief in symptoms.   Labs and imaging reviewed. Discussed with ID Dr. Wendie Agreste regarding the need for abx coverage. No PCP PNA coverage for tonight. They will consult in the AM. Can cover with Unasyn and Vanc. Will send MRSA screen as well.   Discussed patient's case with [facility] to request admission. Patient and family (if present) updated with plan. Care transferred to [facility] service.  I reviewed all nursing notes, vitals, pertinent old records, EKGs, labs, imaging (as available).  ____________________________________________  FINAL CLINICAL  IMPRESSION(S) / ED DIAGNOSES  Final diagnoses:  Multifocal pneumonia     MEDICATIONS GIVEN DURING THIS VISIT:  Medications  amLODipine (NORVASC) tablet 5 mg (has no administration in time range)  benazepril (LOTENSIN) tablet 20 mg (has no administration in time range)  benzonatate (TESSALON) capsule 100 mg (has no administration in time range)  latanoprost (XALATAN) 0.005 % ophthalmic solution 1 drop (has no administration in time range)  Ampicillin-Sulbactam (UNASYN) 3 g in sodium chloride 0.9 % 100 mL IVPB (0 g Intravenous Stopped 05/26/20 1008)  heparin ADULT infusion 100 units/mL (25000 units/22mL sodium chloride 0.45%) (1,000 Units/hr Intravenous Other (enter comment in med admin window) 05/26/20 1008)  iohexol (OMNIPAQUE) 350 MG/ML injection 100  mL (50 mLs Intravenous Contrast Given 05/25/20 2138)  Ampicillin-Sulbactam (UNASYN) 3 g in sodium chloride 0.9 % 100 mL IVPB (0 g Intravenous Stopped 05/25/20 2344)  vancomycin (VANCOREADY) IVPB 1250 mg/250 mL (0 mg Intravenous Stopped 05/26/20 0115)     Note:  This document was prepared using Dragon voice recognition software and may include unintentional dictation errors.  Nanda Quinton, MD, [facility] Emergency Medicine    Manuel Dall, [facility], MD 05/26/20 1018

## 2020-05-25 NOTE — Progress Notes (Signed)
A consult was received from an ED physician for Vancomycin per pharmacy dosing.  The patient's profile has been reviewed for ht/wt/allergies/indication/available labs.    A one time order has been placed for Vancomycin 1250mg  IV.    Further antibiotics/pharmacy consults should be ordered by admitting physician if indicated.                       Thank you, Everette Rank, PharmD 05/25/2020  10:57 PM

## 2020-05-25 NOTE — H&P (Addendum)
History and Physical    Peter Wood HFW:[id number] DOB: Jun 20, 1935 DOA: 05/25/2020  PCP: Cassandria Anger, MD   Patient coming from: Home  I have personally briefly reviewed patient's old medical records in [place]  Chief Complaint: Worsening shortness of breath and persistent fever x2 weeks.  HPI: Peter Wood is a 85 y.o. male with HTN, PE 2020, gastric adenocarcinoma with mets to liver and bone Dx 09/2018 s/p chemotherapy with good response initially but slow progression more recently on Enhertu last received 04/28/2020 who was referred from oncology clinic for evaluation of persistent fever and dyspnea.   He reports recurrent fevers and dyspnea despite treatment. Symptoms started 3 to 4 weeks ago marked by significant dyspnea, cough productive of yellowish phlegm and recurrent fevers. He denies any wheezing, chest pain/tightness, leg swelling or orthopnea.  CXR 04/28/2020 with bilateral infiltrates however Covid PCR was negative. Despite negative Covid test and given high risk, he receivedmonoclonal antibody casirivimab/imdevimab 04/29/2020 followed by a course of azithromycin without significant improvement.   He reports a temp of 101.5 today.  Mildly productive cough with yellowish phlegm.  Presented to oncology clinic who ordered blood work and CXR.   CXR showed persistent multifocal air space opacities.  Differential includes atypical pneumonia versus lymphangitic spread of tumor.  Pt sent to ED for further work up with involvement of pulmonary for possible bronchoscopy.    ED Course:  133/76, pulse 101, RR 20-27, SPO2 88-96% room air  -Labs with sodium 133, potassium 4.1, BUN 16, creatinine 0.8 -Albumin 2.2 AST 116, ALT 86, [name] 533 -WBC 10, Hb 10.4, platelets 318 -BNP 54 -Troponin XIX -Covid PCR negative -Lactic acid 1.4 -D-dimer greater than 20 -LDH 313 -Ferritin 1013 -CRP 17.8  CTA chest without evidence of PE but extensive bilateral  asymmetric airspace opacities.  Rule out atypical pneumonia.  Evidence of pulmonary hypertension.  EKG with NSR at 95 bpm, normal axis, no ST-T wave changes.  Intervals within normal limits.  ED meds: Vancomycin and Unasyn.   Review of Systems: As per HPI otherwise 10 point review of systems negative.    Past Medical History:  Diagnosis Date  . Arthritis   . Diverticulosis of colon   . Family history of breast cancer   . Family history of prostate cancer   . HTN (hypertension)   . Hx of colonic polyp   . Pneumonia    as a child/baby  . Poor circulation of extremity    right leg  . Prostate cancer [facility]) 2015   prostate cancer - radiation treated    Past Surgical History:  Procedure Laterality Date  . [place] VITRECTOMY WITH 20 GAUGE MVR PORT FOR MACULAR HOLE Right 09/19/2016   Procedure: 25 GAUGE PARS PLANA VITRECTOMY WITH 20 GAUGE MVR PORT FOR MACULAR HOLE, MEMBRANE PEEL, SERUM PATCH, GAS FLUID EXCHANGE, HEADSCOPE LASER;  Surgeon: [name], MD;  Location: [place];  Service: Ophthalmology;  Laterality: Right;  . BIOPSY  09/20/2018   Procedure: BIOPSY;  Surgeon: Gatha Mayer, MD;  Location: [facility] ENDOSCOPY;  Service: Endoscopy;;  . CATARACT EXTRACTION  08/27/12   Right  . COLONOSCOPY    . ESOPHAGOGASTRODUODENOSCOPY (EGD) WITH PROPOFOL N/A 09/20/2018   Procedure: ESOPHAGOGASTRODUODENOSCOPY (EGD) WITH PROPOFOL;  Surgeon: Gatha Mayer, MD;  Location: [place];  Service: Endoscopy;  Laterality: N/A;  . HAND SURGERY  2016   right thumb  . IR IMAGING GUIDED PORT INSERTION  11/04/2018  . KNEE SURGERY  Left 1989     reports that he quit smoking about 52 years ago. He quit after 4.00 years of use. He has never used smokeless tobacco. He reports that he does not drink alcohol and does not use drugs.  No Known Allergies  Family History  Problem Relation Age of Onset  . Prostate cancer Father        dx >50  . Hypertension Mother   . Ulcers Mother   . Breast  cancer Sister        dx 20's  . Prostate cancer Brother        dx under 25, metasttic, cause of his death  . Ulcers Brother   . Ulcers Sister     Prior to Admission medications   Medication Sig Start Date End Date Taking? Authorizing Provider  amLODipine (NORVASC) 5 MG tablet Take 1 tablet (5 mg total) by mouth daily. 12/11/19   Plotnikov, Evie Lacks, MD  apixaban (ELIQUIS) 2.5 MG TABS tablet Take 1 tablet (2.5 mg total) by mouth 2 (two) times daily. 04/12/20   Plotnikov, Evie Lacks, MD  azithromycin (ZITHROMAX Z-PAK) 250 MG tablet Take as directed 05/18/20   Gardenia Phlegm, NP  benazepril (LOTENSIN) 40 MG tablet Take 0.5 tablets (20 mg total) by mouth daily. 04/12/20   Plotnikov, Evie Lacks, MD  benzonatate (TESSALON) 100 MG capsule Take 1 capsule (100 mg total) by mouth 2 (two) times daily as needed for cough. 05/18/20   Gardenia Phlegm, NP  Cholecalciferol 1000 UNITS tablet Take 1,000 Units by mouth daily.     [provider]  latanoprost (XALATAN) 0.005 % ophthalmic solution Place 1 drop into both eyes at bedtime.  10/24/17   [provider]  lidocaine-prilocaine (EMLA) cream Apply to affected area once 11/06/18   Magrinat, Virgie Dad, MD  Misc Natural Products (AIRBORNE ELDERBERRY) CHEW Chew by mouth daily. X 2 Gummies    [provider]  ondansetron (ZOFRAN) 4 MG tablet Take 1 tablet (4 mg total) by mouth every 8 (eight) hours as needed for nausea or vomiting. 10/01/18   Plotnikov, Evie Lacks, MD  predniSONE (DELTASONE) 5 MG tablet Take 1 tablet by mouth daily with breakfast 03/16/20   Tanner, Lyndon Code., PA-C  promethazine-codeine (PHENERGAN WITH CODEINE) 6.25-10 MG/5ML syrup Take 5 mLs by mouth every 8 (eight) hours as needed for cough. 05/24/20   Magrinat, Virgie Dad, MD  triamcinolone lotion (KENALOG) 0.1 % Apply 1 application topically 3 (three) times daily. 03/05/20   Harle Stanford., PA-C    Physical Exam: Vitals:   05/25/20 2000 05/25/20 2001 05/25/20 2100  05/25/20 2200  BP: (!) 137/92 (!) 137/92 (!) 150/87 (!) 150/92  Pulse: 89 88 96 (!) 103  Resp: (!) 27 (!) 21 20 18   Temp:      TempSrc:      SpO2: (!) 88% 90% 96% 100%  Weight:      Height:        Constitutional: NAD, calm, comfortable Cachectic appearing elderly male. Vitals:   05/25/20 2000 05/25/20 2001 05/25/20 2100 05/25/20 2200  BP: (!) 137/92 (!) 137/92 (!) 150/87 (!) 150/92  Pulse: 89 88 96 (!) 103  Resp: (!) 27 (!) 21 20 18   Temp:      TempSrc:      SpO2: (!) 88% 90% 96% 100%  Weight:      Height:       Eyes: PERRL, lids and conjunctivae normal ENMT: Mucous membranes are moist. Posterior pharynx  clear of any exudate or lesions.Normal dentition.  Neck: normal, supple, no masses, no thyromegaly Respiratory: No evidence of respiratory stress/accessory muscle use. Bilateral Rales present. Cardiovascular: Tachycardic regular rate and rhythm, no murmurs / rubs / gallops. No extremity edema. 2+ pedal pulses. No carotid bruits.  Abdomen: no tenderness, no masses palpated. No hepatosplenomegaly. Bowel sounds positive.  Musculoskeletal: no clubbing / cyanosis. No joint deformity upper and lower extremities. Good ROM, no contractures. Normal muscle tone.  Skin: no rashes, lesions, ulcers. No induration Neurologic: CN 2-12 grossly intact. Sensation intact, DTR normal. Strength 5/5 in all 4.  Psychiatric: Normal judgment and insight. Alert and oriented x 3. Normal mood.    Labs on Admission: I have personally reviewed following labs and imaging studies  CBC: Recent Labs  Lab 05/25/20 1552  WBC 10.0  NEUTROABS 7.8*  HGB 10.4*  HCT 31.3*  MCV 101.6*  PLT 297   Basic Metabolic Panel: Recent Labs  Lab 05/25/20 1552  NA 133*  K 4.1  CL 96*  CO2 29  GLUCOSE 94  BUN 16  CREATININE 0.80  CALCIUM 9.1   GFR: Estimated Creatinine Clearance: 59.6 mL/min (by C-G formula based on SCr of 0.8 mg/dL). Liver Function Tests: Recent Labs  Lab 05/25/20 1552  AST 116*  ALT  86*  ALKPHOS 533*  BILITOT 1.1  PROT 6.8  ALBUMIN 2.2*   No results for input(s): LIPASE, AMYLASE in the last 168 hours. No results for input(s): AMMONIA in the last 168 hours. Coagulation Profile: No results for input(s): INR, PROTIME in the last 168 hours. Cardiac Enzymes: No results for input(s): CKTOTAL, CKMB, CKMBINDEX, TROPONINI in the last 168 hours. BNP (last 3 results) No results for input(s): PROBNP in the last 8760 hours. HbA1C: No results for input(s): HGBA1C in the last 72 hours. CBG: No results for input(s): GLUCAP in the last 168 hours. Lipid Profile: Recent Labs    05/25/20 1805  TRIG 89   Thyroid Function Tests: No results for input(s): TSH, T4TOTAL, FREET4, T3FREE, THYROIDAB in the last 72 hours. Anemia Panel: Recent Labs    05/25/20 1805  FERRITIN 1,013*   Urine analysis:    Component Value Date/Time   COLORURINE YELLOW 02/18/2019 1056   APPEARANCEUR CLEAR 02/18/2019 1056   LABSPEC 1.018 02/18/2019 1056   PHURINE 6.0 02/18/2019 1056   GLUCOSEU NEGATIVE 02/18/2019 1056   GLUCOSEU NEGATIVE 07/20/2017 1056   HGBUR NEGATIVE 02/18/2019 1056   [place] 02/18/2019 [place] 02/18/2019 1056   PROTEINUR 30 (A) 02/18/2019 1056   UROBILINOGEN 0.2 07/20/2017 1056   NITRITE NEGATIVE 02/18/2019 1056   [place] 02/18/2019 1056    Radiological Exams on Admission: DG Chest 2 View  Result Date: 05/25/2020 CLINICAL DATA:  Cough and fever.  Reported known prostate carcinoma EXAM: CHEST - 2 VIEW COMPARISON:  April 28, 2020 FINDINGS: There is widespread multifocal airspace opacity throughout the lungs bilaterally, most notably in the right upper lobe. There is an equivocal right pleural effusion. Heart is upper normal in size with pulmonary vascularity normal. Port-A-Cath tip is in the superior vena cava. No appreciable adenopathy. No blastic or lytic bone lesions are appreciable by radiography. IMPRESSION: Widespread  airspace opacity bilaterally, more severe on the right than on the left. Suspect atypical organism pneumonia given this overall appearance. Lymphangitic spread of tumor conceivably could present in this manner but is felt to be less likely given this overall appearance. No well-defined nodular opacities noted within the lungs. Port-A-Cath tip  in superior vena cava. No adenopathy evident by radiography. Heart upper normal in size. These results will be called to the ordering clinician or representative by the Radiologist Assistant, and communication documented in the PACS or [employer]. Electronically Signed   By: Lowella Grip III M.D.   On: 05/25/2020 15:29   CT Angio Chest PE W and/or Wo Contrast  Result Date: 05/25/2020 CLINICAL DATA:  Dyspnea, fevers, elevated D-dimer EXAM: CT ANGIOGRAPHY CHEST WITH CONTRAST TECHNIQUE: Multidetector CT imaging of the chest was performed using the standard protocol during bolus administration of intravenous contrast. Multiplanar CT image reconstructions and MIPs were obtained to evaluate the vascular anatomy. CONTRAST:  65mL OMNIPAQUE IOHEXOL 350 MG/ML SOLN COMPARISON:  None. FINDINGS: Cardiovascular: Right internal jugular chest port tip noted within the right atrium. Global cardiac size within normal limits. No significant coronary artery calcification. Small simple appearing pericardial effusion without CT evidence of cardiac tamponade. Central pulmonary arterial enlargement in keeping with changes of pulmonary artery hypertension. There is excellent opacification of the central pulmonary arterial tree without evidence of filling defect to suggest acute pulmonary embolism. The thoracic aorta is unremarkable. Mediastinum/Nodes: No pathologic thoracic adenopathy. Lungs/Pleura: There are extensive bilateral asymmetric airspace infiltrates demonstrating an upper and mid lung zone predominance most in keeping with changes of atypical infection in the acute setting.  Small right pleural effusion is present. No pneumothorax. The central airways are widely patent. Upper Abdomen: Low-attenuation lesion is seen within the left hepatic lobe, poorly characterized on this examination. Musculoskeletal: No chest wall abnormality. No acute or significant osseous findings. Review of the MIP images confirms the above findings. IMPRESSION: 1. No evidence of acute pulmonary embolism. 2. Extensive bilateral asymmetric airspace infiltrates demonstrating an upper and mid lung zone predominance most in keeping with changes of atypical infection in the acute setting. Serologic correlation for COVID-19 pneumonia may be helpful for further evaluation. 3. Small right pleural effusion. 4. Small simple appearing pericardial effusion without CT evidence of cardiac tamponade. 5. Central pulmonary arterial enlargement in keeping with changes of pulmonary artery hypertension. Electronically Signed   By: Fidela Salisbury MD   On: 05/25/2020 22:05    EKG: Independently reviewed. EKG with NSR at 95 bpm, normal axis, no ST-T wave changes.  Intervals within normal limits.   Assessment/Plan Active Problems:   Essential hypertension   Pulmonary embolism (HCC)   Liver metastases (HCC)   Metastasis from malignant tumor of stomach (HCC)   Gastric cancer (HCC)   Iron deficiency anemia due to chronic blood loss   Multifocal pneumonia   Hypoalbuminemia   Abnormal LFTs   # Multifocal lung infiltrate with hypoxia -COVID PCR negative. D/D atypical infection vs lymphangitic spread vs pneumonitis from Enhertu  -After discussing with ID patient empirically started on vancomycin/Unasyn.  Follow-up consult. -We will keep n.p.o. for bronc/biopsy in a.m.  Pulmonary consulted.  -Holding Eliquis and will try to bridge with heparin anticipating need for lung biopsy.  #HTN -Continue with benazepril and Norvasc  #History of PE -We will hold Eliquis and bridge with heparin in anticipation of bronc with  possible lung biopsy.  #Hypoalbuminemia 2.2  #Worsening LFTs predominantly cholestatic pattern Consistent with metastatic disease   DVT prophylaxis: Patient already on Eliquis.   Code Status: Full code   Family Communication: None.   Disposition Plan: Home versus rehab.    Consults called: Infectious disease, pulmonary Admission status: Inpatient.   Desiree Fleming MD [facility]   If 7PM-7AM, please contact night-coverage [email or web address] Password [email or web address]  05/25/2020, 10:31 PM

## 2020-05-25 NOTE — ED Triage Notes (Signed)
Patient arrives from the cancer center with Lucianne Lei, [place].  Patient has been having SOB and fevers.  Patient was recently tested for Covid which was negative however patient still received the antibody therapy for covid infection despite the fact that he was negative.  Patient has a hx of gastric cancer with liver mets.  Patient receives Enhertu every 3 weeks at the cancer center.  Patient had CXR at the cancer center that showed PNA per Lucianne Lei, [place].

## 2020-05-25 NOTE — Telephone Encounter (Signed)
This RN spoke with pt and his wifeEnid Derry- per ongoing cough with fluctuating temperatures . Post " severe coughing fit this morning I took his temp and it was 101.5"  She states coughing starting " I think because he was laying down- I got him up into the recliner and his cough is not occurring sitting up "  She denies any shaking chills.  Cough can be productive of yellow phlegm.  He is becoming more weak with small ambulations ( bedroom to bathroom ) causing SOB.  He denies any pain- in body or with breathing.  Per MD review request is for the pt to have evaluation today ( has routine appt tomorrow ) due to above continued onset and concern for infection other than Covid.  Note pt had symptoms 2 weeks ago - was Covid tested- obtained IV monoclonal covid antibodies though he tested negative for Covid.  His wife is asymptomatic.  Per review with [facility] provider - requested visit with fever work up for labs and xray.  Called and discussed with [name]- she states she will proceed with bringing [place] in but may take a little while due to getting help with transportation.  This RN informed her to go to radiology first then come here for lab and visit.  [name] verbalized understanding.

## 2020-05-25 NOTE — Progress Notes (Signed)
Sympt[facility] Management Clinic Progress Note   JABARRI STEFANELLI [id number] 03/21/1935 85 y.o.  Peter Wood is managed by Dr. Lurline Del  Actively treated with chemotherapy/immunotherapy/hormonal therapy: yes  Current therapy: Enhertu  Last treated: 04/28/2020  Next scheduled appointment with provider: 05/26/2020  Assessment: Plan:    Malignant neoplasm of stomach, unspecified location [facility])  Liver metastases ([place])  Fever, unspecified fever cause  Port-A-Cath in place - Plan: sodium chloride flush (NS) 0.9 % injection 10 mL  Metastasis from malignant tumor of stomach ([place]) - Plan: sodium chloride flush (NS) 0.9 % injection 10 mL   Metastatic malignant neoplasm of the stomach with liver metastasis: The patient continues to be managed by Dr. Jana Wood and is treated with Enhertu which he last received on 04/28/2020.  He is scheduled to return for follow-up on 05/26/2020.  Fever: Patient presents to the clinic today having recently had a negative Covid test and treatment of antibody therapy for Covid infection.  Despite this he continues to run a fever.  He had a chest x-ray completed today along with blood cultures, CBC, and chemistry panel.  No urinalysis or urine culture could be obtained.   The patient's complete blood count returned with a WBC of 10, ANC of 7.8, hemoglobin 10.4, hematocrit 31.3, and platelet count of 318.  3 weeks ago his CBC returned with a WBC of 4.3 and an ANC of 2.5.  His hemoglobin and hematocrit were relatively stable with his platelet count at 185 at that time.  A chest x-ray returned showing:  FINDINGS: There is widespread multifocal airspace opacity throughout the lungs bilaterally, most notably in the right upper lobe.  There is an equivocal right pleural effusion.  Heart is upper normal in size with pulmonary vascularity normal.  Port-A-Cath tip is in the superior vena cava.  No appreciable adenopathy.  No blastic or lytic bone lesions  are appreciable by radiography.  IMPRESSION: Widespread airspace opacity bilaterally, more severe on the right than on the left.  Suspect atypical organism pneumonia given this overall appearance.  Lymphangitic spread of tumor conceivably could present in this manner but is felt to be less likely given this overall appearance.  No well-defined nodular opacities noted within the lungs.  Port-A-Cath tip in superior vena cava.  No adenopathy evident by radiography.  Heart upper normal in size.  His oncologist Dr. Jana Wood believe that the patient needed to be admitted for evaluation and management.  Dr. Jana Wood believes that the patient should have a consult with pulmonology for a potential bronchoscopy for retrieval of the sample for culture.  Please see After Visit Summary for patient specific instructions.  Future Appointments  Date Time Provider [place]  05/26/2020  8:00 AM [facility] None  05/26/2020  8:15 AM [facility] [place] FLUSH [facility] None  05/26/2020  8:30 AM Magrinat, Virgie Dad, MD [facility] None  05/26/2020  9:30 AM [facility] INFUSION [facility] None  06/23/2020  8:00 AM [facility] LAB [facility] None  06/23/2020  8:15 AM [facility] [place] FLUSH [facility] None  06/23/2020  8:30 AM Causey, Charlestine Massed, NP [facility] None  06/23/2020  9:30 AM [facility] INFUSION [facility] None  07/14/2020  9:15 AM [name], MD TRE-TRE None  07/21/2020  9:15 AM [facility] LAB [facility] None  07/21/2020  9:30 AM [facility] [place] FLUSH [facility] None  07/21/2020 10:00 AM Magrinat, Virgie Dad, MD [facility] None  07/22/2020  9:15 AM [facility] ECHO OP 2 [facility]OLAB [facility]  07/22/2020 10:40 AM Bensimhon, Shaune Pascal, MD  [facility] None    No orders of the defined types were placed in this encounter.      Subjective:   Patient ID:  Peter Wood is a 85 y.o. (DOB May 22, 1935) male.  Chief Complaint: No chief complaint on file.   HPI LEGRANDE HAO  is a  85 y.o. male with a diagnosis of a metastatic malignant neoplasm of the stomach with liver metastasis.  He continues to be followed by Dr. Jana Wood and was last treated with Enhertu which he last received on 04/28/2020.  [name] has an ongoing fever.  Our office was contacted by his wife this morning with her conversation as documented below.  This RN spoke with pt and his wifeEnid Derry- per ongoing cough with fluctuating temperatures . Post " severe coughing fit this morning I took his temp and it was 101.5"  She states coughing starting " I think because he was laying down- I got him up into the recliner and his cough is not occurring sitting up "  She denies any shaking chills.  Cough can be productive of yellow phlegm.  He is becoming more weak with small ambulations ( bedroom to bathroom ) causing SOB.  He denies any pain- in body or with breathing.  Per MD review request is for the pt to have evaluation today ( has routine appt tomorrow ) due to above continued onset and concern for infection other than Covid.  Note pt had symptoms 2 weeks ago - was Covid tested- obtained IV monoclonal covid antibodies though he tested negative for Covid.  His wife is asymptomatic.  Per review with [facility] provider - requested visit with fever work up for labs and xray.  Called and discussed with Peter Derry- she states she will proceed with bringing Peter Wood in but may take a little while due to getting help with transportation.  This RN informed her to go to radiology first then come here for lab and visit.  Peter Derry verbalized understanding.   A chest x-ray was completed today which returned showing:  FINDINGS: There is widespread multifocal airspace opacity throughout the lungs bilaterally, most notably in the right upper lobe.  There is an equivocal right pleural effusion.  Heart is upper normal in size with pulmonary vascularity normal.  Port-A-Cath tip is in the superior vena  cava.  No appreciable adenopathy.  No blastic or lytic bone lesions are appreciable by radiography.  IMPRESSION: Widespread airspace opacity bilaterally, more severe on the right than on the left.  Suspect atypical organism pneumonia given this overall appearance.  Lymphangitic spread of tumor conceivably could present in this manner but is felt to be less likely given this overall appearance.  No well-defined nodular opacities noted within the lungs.  Port-A-Cath tip in superior vena cava.  No adenopathy evident by radiography.  Heart upper normal in size.   Medications: I have reviewed the patient's current medications.  Allergies: No Known Allergies  Past Medical History:  Diagnosis Date  . Arthritis   . Diverticulosis of colon   . Family history of breast cancer   . Family history of prostate cancer   . HTN (hypertension)   . Hx of colonic polyp   . Pneumonia    as a child/baby  . Poor circulation of extremity    right leg  . Prostate cancer [facility]) 2015   prostate cancer - radiation treated    Past Surgical History:  Procedure Laterality Date  . [place]  27 GAUGE MVR PORT FOR MACULAR HOLE Right 09/19/2016   Procedure: 25 GAUGE PARS PLANA VITRECTOMY WITH 20 GAUGE MVR PORT FOR MACULAR HOLE, MEMBRANE PEEL, SERUM PATCH, GAS FLUID EXCHANGE, HEADSCOPE LASER;  Surgeon: [name], MD;  Location: [place];  Service: Ophthalmology;  Laterality: Right;  . BIOPSY  09/20/2018   Procedure: BIOPSY;  Surgeon: Gatha Mayer, MD;  Location: [facility] ENDOSCOPY;  Service: Endoscopy;;  . CATARACT EXTRACTION  08/27/12   Right  . COLONOSCOPY    . ESOPHAGOGASTRODUODENOSCOPY (EGD) WITH PROPOFOL N/A 09/20/2018   Procedure: ESOPHAGOGASTRODUODENOSCOPY (EGD) WITH PROPOFOL;  Surgeon: Gatha Mayer, MD;  Location: [place];  Service: Endoscopy;  Laterality: N/A;  . HAND SURGERY  2016   right thumb  . IR IMAGING GUIDED PORT INSERTION  11/04/2018  . KNEE SURGERY Left  1989    Family History  Problem Relation Age of Onset  . Prostate cancer Father        dx >50  . Hypertension Mother   . Ulcers Mother   . Breast cancer Sister        dx 20's  . Prostate cancer Brother        dx under 25, metasttic, cause of his death  . Ulcers Brother   . Ulcers Sister     Social History   Socioeconomic History  . Marital status: Married    Spouse name: Not on file  . Number of children: 4  . Years of education: Not on file  . Highest education level: Not on file  Occupational History  . Occupation: retired  Tobacco Use  . Smoking status: Former Smoker    Years: 4.00    Quit date: 09/12/1967    Years since quitting: 52.7  . Smokeless tobacco: Never Used  Vaping Use  . Vaping Use: Never used  Substance and Sexual Activity  . Alcohol use: No  . Drug use: No  . Sexual activity: Not on file  Other Topics Concern  . Not on file  Social History Narrative  . Not on file   Social Determinants of Health   Financial Resource Strain: Low Risk   . Difficulty of Paying Living Expenses: Not hard at all  Food Insecurity: No Food Insecurity  . Worried About Charity fundraiser in the Last Year: Never true  . Ran Out of Food in the Last Year: Never true  Transportation Needs: No Transportation Needs  . Lack of Transportation (Medical): No  . Lack of Transportation (Non-Medical): No  Physical Activity: Insufficiently Active  . Days of Exercise per Week: 5 days  . Minutes of Exercise per Session: 10 min  Stress: No Stress Concern Present  . Feeling of Stress : Not at all  Social Connections:   . Frequency of Communication with Friends and Family: Not on file  . Frequency of Social Gatherings with Friends and Family: Not on file  . Attends Religious Services: Not on file  . Active Member of Clubs or Organizations: Not on file  . Attends Archivist Meetings: Not on file  . Marital Status: Not on file  Intimate Partner Violence:   . Fear of  Current or Ex-Partner: Not on file  . Emotionally Abused: Not on file  . Physically Abused: Not on file  . Sexually Abused: Not on file    Past Medical History, Surgical history, Social history, and Family history were reviewed and updated as appropriate.   Please see review of systems for further  details on the patient's review from today.   Review of Systems:  Review of Systems  Constitutional: Positive for fever. Negative for chills, diaphoresis and fatigue.  HENT: Negative for congestion, postnasal drip, rhinorrhea, sore throat and trouble swallowing.   Respiratory: Positive for cough and shortness of breath. Negative for wheezing.   Cardiovascular: Negative for palpitations.  Neurological: Positive for weakness. Negative for headaches.    Objective:   Physical Exam:  BP 122/84 (BP Location: Right Arm, Patient Position: Sitting)   Pulse 100   Temp 99 F (37.2 C) (Tympanic)   Resp 18   SpO2 90%   ECOG: 1  Physical Exam Constitutional:      General: He is not in acute distress.    Appearance: He is not ill-appearing, toxic-appearing or diaphoretic.  HENT:     Head: Normocephalic and atraumatic.  Eyes:     General: No scleral icterus.       Right eye: No discharge.        Left eye: No discharge.     Conjunctiva/sclera: Conjunctivae normal.  Cardiovascular:     Rate and Rhythm: Normal rate. Rhythm regularly irregular.  Pulmonary:     Effort: Pulmonary effort is normal. No respiratory distress.     Breath sounds: Examination of the right-lower field reveals decreased breath sounds. Decreased breath sounds present. No wheezing, rhonchi or rales.    Chest:    Musculoskeletal:     Right lower leg: Edema present.     Left lower leg: Edema present.  Skin:    General: Skin is warm and dry.  Neurological:     Mental Status: He is alert.     Gait: Gait abnormal (Mr. Rindfleisch is ambulating with the use of a wheelchair.).  Psychiatric:        Mood and Affect: Mood  normal.        Behavior: Behavior normal.        Thought Content: Thought content normal.        Judgment: Judgment normal.     Lab Review:     Component Value Date/Time   NA 138 04/28/2020 0845   K 3.1 (L) 04/28/2020 0845   CL 103 04/28/2020 0845   CO2 27 04/28/2020 0845   GLUCOSE 108 (H) 04/28/2020 0845   GLUCOSE 105 (H) 09/13/2006 0900   BUN 11 04/28/2020 0845   CREATININE 0.91 04/28/2020 0845   CREATININE 1.02 10/16/2018 0936   CALCIUM 9.2 04/28/2020 0845   PROT 6.2 (L) 04/28/2020 0845   ALBUMIN 2.7 (L) 04/28/2020 0845   AST 75 (H) 04/28/2020 0845   AST 76 (H) 10/16/2018 0936   ALT 45 (H) 04/28/2020 0845   ALT 47 (H) 10/16/2018 0936   ALKPHOS 341 (H) 04/28/2020 0845   BILITOT 0.9 04/28/2020 0845   BILITOT 0.7 10/16/2018 0936   GFRNONAA >60 04/28/2020 0845   GFRNONAA >60 10/16/2018 0936   GFRAA >60 04/28/2020 0845   GFRAA >60 10/16/2018 0936       Component Value Date/Time   WBC 10.0 05/25/2020 1552   WBC 4.3 04/28/2020 0845   RBC 3.08 (L) 05/25/2020 1552   HGB 10.4 (L) 05/25/2020 1552   HCT 31.3 (L) 05/25/2020 1552   PLT 318 05/25/2020 1552   MCV 101.6 (H) 05/25/2020 1552   MCH 33.8 05/25/2020 1552   MCHC 33.2 05/25/2020 1552   RDW 15.3 05/25/2020 1552   LYMPHSABS 1.1 05/25/2020 1552   MONOABS 0.9 05/25/2020 1552   EOSABS 0.0 05/25/2020 1552  BASOSABS 0.1 05/25/2020 1552   -------------------------------  Imaging from last 24 hours (if applicable):  Radiology interpretation: DG Chest 2 View  Result Date: 05/25/2020 CLINICAL DATA:  Cough and fever.  Reported known prostate carcinoma EXAM: CHEST - 2 VIEW COMPARISON:  April 28, 2020 FINDINGS: There is widespread multifocal airspace opacity throughout the lungs bilaterally, most notably in the right upper lobe. There is an equivocal right pleural effusion. Heart is upper normal in size with pulmonary vascularity normal. Port-A-Cath tip is in the superior vena cava. No appreciable adenopathy. No blastic or  lytic bone lesions are appreciable by radiography. IMPRESSION: Widespread airspace opacity bilaterally, more severe on the right than on the left. Suspect atypical organism pneumonia given this overall appearance. Lymphangitic spread of tumor conceivably could present in this manner but is felt to be less likely given this overall appearance. No well-defined nodular opacities noted within the lungs. Port-A-Cath tip in superior vena cava. No adenopathy evident by radiography. Heart upper normal in size. These results will be called to the ordering clinician or representative by the Radiologist Assistant, and communication documented in the PACS or [employer]. Electronically Signed   By: Lowella Grip III M.D.   On: 05/25/2020 15:29   DG Chest 2 View  Result Date: 04/28/2020 CLINICAL DATA:  Productive cough for 2 days.  History of GI cancer. EXAM: CHEST - 2 VIEW COMPARISON:  Chest x-ray 03/03/2020 FINDINGS: The right IJ power port is stable. The cardiac silhouette, mediastinal and hilar contours are within normal limits and unchanged. Mild hyperinflation and underlying emphysematous changes. Hazy patchy bilateral infiltrates suspicious for atypical/viral pneumonia such as COVID pneumonia. Recommend clinical correlation. No pleural effusions or pulmonary edema. The bony structures are intact. IMPRESSION: Patchy bilateral infiltrates suspicious for atypical/viral pneumonia such as COVID pneumonia. Electronically Signed   By: Marijo Sanes M.D.   On: 04/28/2020 13:21        This patient was seen with Dr. Jana Wood with my treatment plan reviewed with him. He expressed agreement with my medical management of this patient.   ADDENDUM: This 85 year old [place] man was diagnosed with metastatic gastric adenocarcinoma January 2020.  Even though his cancer is not curable he has had a very good response to treatment and most recently has been receiving Enhertu, a form of anti-HER-2 treatment.  This can  affect heart function but his most recent echocardiogram 03/29/2020 showed an ejection fraction in the 50-55% range.  What we are seeing in the chest x-ray is most consistent with atypical pneumonia.  He is immunocompromised secondary to his cancer and its treatment eatment .  He also had a similar although milder episode a month ago, when a chest x-ray showed patchy bilateral infiltrates suspicious for Covid pneumonia and he was treated with the Covid monoclonal antibody although he ultimately tested negative.  He received a Z-Pak subsequently.  He now has worsening disease, accompanied by fever.  This does not look like lymphangitic spread of tumor to me but like atypical infection.  Most likely he will need bronchoscopy/ ID consult for definitive diagnosis.  Given his age and immunocompromise state I do not think it is appropriate to treat him as an outpatient and he is being referred to the emergency room for admission.  I personally saw this patient and performed a substantive portion of this encounter with the listed APP documented above.   Total encounter time 30 minutes.* Chauncey Cruel, MD Medical Oncology and Hematology [facility] [address]  [place], [place] [address] Tel. [phone]    Fax. [phone]

## 2020-05-26 ENCOUNTER — Encounter (HOSPITAL_COMMUNITY): Payer: Self-pay | Admitting: Internal Medicine

## 2020-05-26 ENCOUNTER — Other Ambulatory Visit: Payer: Medicare PPO

## 2020-05-26 ENCOUNTER — Encounter (HOSPITAL_COMMUNITY)
Admission: EM | Disposition: A | Discharge status: Hospice | Payer: Self-pay | Source: Home / Self Care | Attending: Family Medicine

## 2020-05-26 ENCOUNTER — Inpatient Hospital Stay (HOSPITAL_COMMUNITY): Payer: Medicare PPO | Admitting: Anesthesiology

## 2020-05-26 ENCOUNTER — Ambulatory Visit: Payer: Medicare PPO | Admitting: Oncology

## 2020-05-26 ENCOUNTER — Ambulatory Visit: Payer: Medicare PPO

## 2020-05-26 DIAGNOSIS — R945 Abnormal results of liver function studies: Secondary | ICD-10-CM | POA: Diagnosis not present

## 2020-05-26 DIAGNOSIS — C7951 Secondary malignant neoplasm of bone: Secondary | ICD-10-CM | POA: Diagnosis present

## 2020-05-26 DIAGNOSIS — J9600 Acute respiratory failure, unspecified whether with hypoxia or hypercapnia: Secondary | ICD-10-CM

## 2020-05-26 DIAGNOSIS — Z515 Encounter for palliative care: Secondary | ICD-10-CM | POA: Diagnosis not present

## 2020-05-26 DIAGNOSIS — Z8249 Family history of ischemic heart disease and other diseases of the circulatory system: Secondary | ICD-10-CM | POA: Diagnosis not present

## 2020-05-26 DIAGNOSIS — C787 Secondary malignant neoplasm of liver and intrahepatic bile duct: Secondary | ICD-10-CM

## 2020-05-26 DIAGNOSIS — I5032 Chronic diastolic (congestive) heart failure: Secondary | ICD-10-CM | POA: Diagnosis not present

## 2020-05-26 DIAGNOSIS — Z803 Family history of malignant neoplasm of breast: Secondary | ICD-10-CM | POA: Diagnosis not present

## 2020-05-26 DIAGNOSIS — J9601 Acute respiratory failure with hypoxia: Secondary | ICD-10-CM | POA: Diagnosis present

## 2020-05-26 DIAGNOSIS — J189 Pneumonia, unspecified organism: Principal | ICD-10-CM

## 2020-05-26 DIAGNOSIS — I10 Essential (primary) hypertension: Secondary | ICD-10-CM | POA: Diagnosis not present

## 2020-05-26 DIAGNOSIS — C799 Secondary malignant neoplasm of unspecified site: Secondary | ICD-10-CM | POA: Diagnosis not present

## 2020-05-26 DIAGNOSIS — E870 Hyperosmolality and hypernatremia: Secondary | ICD-10-CM | POA: Diagnosis not present

## 2020-05-26 DIAGNOSIS — Z86711 Personal history of pulmonary embolism: Secondary | ICD-10-CM | POA: Diagnosis not present

## 2020-05-26 DIAGNOSIS — Z8711 Personal history of peptic ulcer disease: Secondary | ICD-10-CM | POA: Diagnosis not present

## 2020-05-26 DIAGNOSIS — C169 Malignant neoplasm of stomach, unspecified: Secondary | ICD-10-CM | POA: Diagnosis present

## 2020-05-26 DIAGNOSIS — Z7952 Long term (current) use of systemic steroids: Secondary | ICD-10-CM | POA: Diagnosis not present

## 2020-05-26 DIAGNOSIS — Z79899 Other long term (current) drug therapy: Secondary | ICD-10-CM | POA: Diagnosis not present

## 2020-05-26 DIAGNOSIS — Z20822 Contact with and (suspected) exposure to covid-19: Secondary | ICD-10-CM | POA: Diagnosis present

## 2020-05-26 DIAGNOSIS — Z9981 Dependence on supplemental oxygen: Secondary | ICD-10-CM

## 2020-05-26 DIAGNOSIS — M1990 Unspecified osteoarthritis, unspecified site: Secondary | ICD-10-CM | POA: Diagnosis present

## 2020-05-26 DIAGNOSIS — Z7189 Other specified counseling: Secondary | ICD-10-CM | POA: Diagnosis not present

## 2020-05-26 DIAGNOSIS — K5730 Diverticulosis of large intestine without perforation or abscess without bleeding: Secondary | ICD-10-CM | POA: Diagnosis present

## 2020-05-26 DIAGNOSIS — R070 Pain in throat: Secondary | ICD-10-CM | POA: Diagnosis not present

## 2020-05-26 DIAGNOSIS — I5042 Chronic combined systolic (congestive) and diastolic (congestive) heart failure: Secondary | ICD-10-CM | POA: Diagnosis present

## 2020-05-26 DIAGNOSIS — R509 Fever, unspecified: Secondary | ICD-10-CM | POA: Diagnosis not present

## 2020-05-26 DIAGNOSIS — Z8546 Personal history of malignant neoplasm of prostate: Secondary | ICD-10-CM | POA: Diagnosis not present

## 2020-05-26 DIAGNOSIS — R7989 Other specified abnormal findings of blood chemistry: Secondary | ICD-10-CM | POA: Diagnosis present

## 2020-05-26 DIAGNOSIS — Z8042 Family history of malignant neoplasm of prostate: Secondary | ICD-10-CM | POA: Diagnosis not present

## 2020-05-26 DIAGNOSIS — Z8719 Personal history of other diseases of the digestive system: Secondary | ICD-10-CM | POA: Diagnosis not present

## 2020-05-26 DIAGNOSIS — I472 Ventricular tachycardia: Secondary | ICD-10-CM | POA: Diagnosis not present

## 2020-05-26 DIAGNOSIS — J470 Bronchiectasis with acute lower respiratory infection: Secondary | ICD-10-CM | POA: Diagnosis present

## 2020-05-26 DIAGNOSIS — U071 COVID-19: Secondary | ICD-10-CM | POA: Diagnosis not present

## 2020-05-26 DIAGNOSIS — E8809 Other disorders of plasma-protein metabolism, not elsewhere classified: Secondary | ICD-10-CM | POA: Diagnosis not present

## 2020-05-26 DIAGNOSIS — R0602 Shortness of breath: Secondary | ICD-10-CM | POA: Diagnosis present

## 2020-05-26 DIAGNOSIS — E43 Unspecified severe protein-calorie malnutrition: Secondary | ICD-10-CM | POA: Diagnosis present

## 2020-05-26 DIAGNOSIS — J069 Acute upper respiratory infection, unspecified: Secondary | ICD-10-CM | POA: Diagnosis not present

## 2020-05-26 DIAGNOSIS — I110 Hypertensive heart disease with heart failure: Secondary | ICD-10-CM | POA: Diagnosis present

## 2020-05-26 DIAGNOSIS — Z9221 Personal history of antineoplastic chemotherapy: Secondary | ICD-10-CM | POA: Diagnosis not present

## 2020-05-26 HISTORY — PX: VIDEO BRONCHOSCOPY: SHX5072

## 2020-05-26 HISTORY — PX: BRONCHIAL WASHINGS: SHX5105

## 2020-05-26 LAB — SAMPLE TO BLOOD BANK

## 2020-05-26 LAB — BRAIN NATRIURETIC PEPTIDE: B Natriuretic Peptide: 71.3 pg/mL (ref 0.0–100.0)

## 2020-05-26 LAB — CBC
HCT: 29.6 % — ABNORMAL LOW (ref 39.0–52.0)
Hemoglobin: 9.7 g/dL — ABNORMAL LOW (ref 13.0–17.0)
MCH: 34.2 pg — ABNORMAL HIGH (ref 26.0–34.0)
MCHC: 32.8 g/dL (ref 30.0–36.0)
MCV: 104.2 fL — ABNORMAL HIGH (ref 80.0–100.0)
Platelets: 305 10*3/uL (ref 150–400)
RBC: 2.84 MIL/uL — ABNORMAL LOW (ref 4.22–5.81)
RDW: 15.4 % (ref 11.5–15.5)
WBC: 11.4 10*3/uL — ABNORMAL HIGH (ref 4.0–10.5)
nRBC: 0 % (ref 0.0–0.2)

## 2020-05-26 LAB — BODY FLUID CELL COUNT WITH DIFFERENTIAL
Eos, Fluid: 1 %
Eos, Fluid: 3 %
Lymphs, Fluid: 52 %
Lymphs, Fluid: 71 %
Monocyte-Macrophage-Serous Fluid: 15 % — ABNORMAL LOW (ref 50–90)
Monocyte-Macrophage-Serous Fluid: 24 % — ABNORMAL LOW (ref 50–90)
Neutrophil Count, Fluid: 13 % (ref 0–25)
Neutrophil Count, Fluid: 21 % (ref 0–25)
Total Nucleated Cell Count, Fluid: 157 cu mm (ref 0–1000)
Total Nucleated Cell Count, Fluid: 213 cu mm (ref 0–1000)

## 2020-05-26 LAB — COMPREHENSIVE METABOLIC PANEL
ALT: 63 U/L — ABNORMAL HIGH (ref 0–44)
AST: 79 U/L — ABNORMAL HIGH (ref 15–41)
Albumin: 2.2 g/dL — ABNORMAL LOW (ref 3.5–5.0)
Alkaline Phosphatase: 386 U/L — ABNORMAL HIGH (ref 38–126)
Anion gap: 9 (ref 5–15)
BUN: 14 mg/dL (ref 8–23)
CO2: 26 mmol/L (ref 22–32)
Calcium: 8.3 mg/dL — ABNORMAL LOW (ref 8.9–10.3)
Chloride: 100 mmol/L (ref 98–111)
Creatinine, Ser: 0.71 mg/dL (ref 0.61–1.24)
GFR calc Af Amer: >60 mL/min (ref >60)
GFR calc non Af Amer: >60 mL/min (ref >60)
Glucose, Bld: 94 mg/dL (ref 70–99)
Potassium: 3.9 mmol/L (ref 3.5–5.1)
Sodium: 135 mmol/L (ref 135–145)
Total Bilirubin: 0.9 mg/dL (ref 0.3–1.2)
Total Protein: 6.1 g/dL — ABNORMAL LOW (ref 6.5–8.1)

## 2020-05-26 LAB — TSH: TSH: 0.538 u[IU]/mL (ref 0.320–4.118)

## 2020-05-26 LAB — MRSA PCR SCREENING: MRSA by PCR: NEGATIVE

## 2020-05-26 LAB — APTT
aPTT: 42 seconds — ABNORMAL HIGH (ref 24–36)
aPTT: 60 seconds — ABNORMAL HIGH (ref 24–36)

## 2020-05-26 LAB — PROTIME-INR
INR: 1.5 — ABNORMAL HIGH (ref 0.8–1.2)
Prothrombin Time: 17.8 seconds — ABNORMAL HIGH (ref 11.4–15.2)

## 2020-05-26 LAB — CANCER ANTIGEN 19-9: CA 19-9: 101 U/mL — ABNORMAL HIGH (ref 0–35)

## 2020-05-26 LAB — HEPARIN LEVEL (UNFRACTIONATED)
Heparin Unfractionated: 0.76 IU/mL — ABNORMAL HIGH (ref 0.30–0.70)
Heparin Unfractionated: 0.67 IU/mL (ref 0.30–0.70)

## 2020-05-26 LAB — C-REACTIVE PROTEIN: CRP: 21.8 mg/dL — ABNORMAL HIGH (ref <1.0)

## 2020-05-26 LAB — CEA (IN HOUSE-CHCC): CEA (CHCC-In House): 358.38 ng/mL — ABNORMAL HIGH (ref 0.00–5.00)

## 2020-05-26 LAB — SEDIMENTATION RATE: Sed Rate: 96 mm/hr — ABNORMAL HIGH (ref 0–16)

## 2020-05-26 SURGERY — VIDEO BRONCHOSCOPY WITHOUT FLUORO
Anesthesia: General

## 2020-05-26 MED ORDER — BENZONATATE 100 MG PO CAPS: 100.0000 mg | ORAL_CAPSULE | Freq: Two times a day (BID) | ORAL | Status: DC | PRN

## 2020-05-26 MED ORDER — LIDOCAINE 2% (20 MG/ML) 5 ML SYRINGE
INTRAMUSCULAR | Status: DC | PRN
  Administered 2020-05-26: 100 mg via INTRAVENOUS

## 2020-05-26 MED ORDER — ACETAMINOPHEN 325 MG PO TABS
650.0000 mg | ORAL_TABLET | Freq: Four times a day (QID) | ORAL | Status: DC | PRN
  Administered 2020-05-26: 650 mg via ORAL
  Filled 2020-05-26: qty 2

## 2020-05-26 MED ORDER — AMLODIPINE BESYLATE 5 MG PO TABS
5.0000 mg | ORAL_TABLET | Freq: Every day | ORAL | Status: DC
  Administered 2020-05-26 – 2020-05-28 (×3): 5 mg via ORAL
  Filled 2020-05-26 (×3): qty 1

## 2020-05-26 MED ORDER — CHLORHEXIDINE GLUCONATE CLOTH 2 % EX PADS
6.0000 | MEDICATED_PAD | Freq: Every day | CUTANEOUS | Status: DC
  Administered 2020-05-27 – 2020-06-06 (×12): 6 via TOPICAL

## 2020-05-26 MED ORDER — LIDOCAINE HCL URETHRAL/MUCOSAL 2 % EX GEL
CUTANEOUS | Status: AC
  Filled 2020-05-26: qty 30

## 2020-05-26 MED ORDER — SUCCINYLCHOLINE CHLORIDE 200 MG/10ML IV SOSY
PREFILLED_SYRINGE | INTRAVENOUS | Status: DC | PRN
  Administered 2020-05-26: 80 mg via INTRAVENOUS

## 2020-05-26 MED ORDER — LATANOPROST 0.005 % OP SOLN
1.0000 [drp] | Freq: Every day | OPHTHALMIC | Status: DC
  Administered 2020-05-27 – 2020-06-05 (×9): 1 [drp] via OPHTHALMIC
  Filled 2020-05-26 (×2): qty 2.5

## 2020-05-26 MED ORDER — HEPARIN (PORCINE) 25000 UT/250ML-% IV SOLN
1000.0000 [IU]/h | INTRAVENOUS | Status: DC
  Administered 2020-05-26 (×2): 1000 [IU]/h via INTRAVENOUS
  Filled 2020-05-26: qty 250

## 2020-05-26 MED ORDER — ONDANSETRON HCL 4 MG/2ML IJ SOLN
INTRAMUSCULAR | Status: DC | PRN
  Administered 2020-05-26: 4 mg via INTRAVENOUS

## 2020-05-26 MED ORDER — LACTATED RINGERS IV SOLN: INTRAVENOUS | Status: DC | PRN

## 2020-05-26 MED ORDER — SODIUM CHLORIDE 0.9% FLUSH
10.0000 mL | INTRAVENOUS | Status: DC | PRN
  Administered 2020-06-06: 10 mL

## 2020-05-26 MED ORDER — BENAZEPRIL HCL 10 MG PO TABS
20.0000 mg | ORAL_TABLET | Freq: Every day | ORAL | Status: DC
  Administered 2020-05-26 – 2020-05-28 (×3): 20 mg via ORAL
  Filled 2020-05-26 (×2): qty 2
  Filled 2020-05-26: qty 1

## 2020-05-26 MED ORDER — VANCOMYCIN HCL 500 MG/100ML IV SOLN
500.0000 mg | Freq: Two times a day (BID) | INTRAVENOUS | Status: DC
  Filled 2020-05-26: qty 100

## 2020-05-26 MED ORDER — PROPOFOL 10 MG/ML IV BOLUS
INTRAVENOUS | Status: DC | PRN
  Administered 2020-05-26: 100 mg via INTRAVENOUS

## 2020-05-26 MED ORDER — PROPOFOL 500 MG/50ML IV EMUL
INTRAVENOUS | Status: AC
  Filled 2020-05-26: qty 50

## 2020-05-26 MED ORDER — SODIUM CHLORIDE 0.9 % IV SOLN
3.0000 g | Freq: Four times a day (QID) | INTRAVENOUS | Status: DC
  Administered 2020-05-26 – 2020-05-30 (×14): 3 g via INTRAVENOUS
  Filled 2020-05-26 (×2): qty 8
  Filled 2020-05-26 (×2): qty 3
  Filled 2020-05-26: qty 8
  Filled 2020-05-26 (×10): qty 3

## 2020-05-26 MED ORDER — VANCOMYCIN HCL 1250 MG/250ML IV SOLN: 1250.0000 mg | Freq: Two times a day (BID) | INTRAVENOUS | Status: DC

## 2020-05-26 MED ORDER — PHENYLEPHRINE HCL 1 % NA SOLN
NASAL | Status: AC
  Filled 2020-05-26: qty 15

## 2020-05-26 MED ORDER — HEPARIN (PORCINE) 25000 UT/250ML-% IV SOLN
1050.0000 [IU]/h | INTRAVENOUS | Status: DC
  Administered 2020-05-26 (×2): 1050 [IU]/h via INTRAVENOUS
  Filled 2020-05-26 (×2): qty 250

## 2020-05-26 MED ORDER — SODIUM CHLORIDE 0.9 % IV SOLN
3.0000 g | Freq: Three times a day (TID) | INTRAVENOUS | Status: DC
  Administered 2020-05-26: 3 g via INTRAVENOUS
  Filled 2020-05-26: qty 8

## 2020-05-26 NOTE — Procedures (Signed)
Bronchoscopy Procedure Note  Peter Wood  [id number]  Jan 30, 1935  Date:05/26/20  Time:1:54 PM   Provider Performing:Kaytie Ratcliffe B Latrenda Irani   Procedure(s):  Flexible bronchoscopy with bronchial alveolar lavage (83437)  Indication(s) Respiratory Failure  Consent Risks of the procedure as well as the alternatives and risks of each were explained to the patient and/or caregiver.  Consent for the procedure was obtained and is signed in the bedside chart  Anesthesia Provided by anesthesia team. Please see their note for complete documentation of medications provided.   Time Out Verified patient identification, verified procedure, site/side was marked, verified correct patient position, special equipment/implants available, medications/allergies/relevant history reviewed, required imaging and test results available.   Sterile Technique Usual hand hygiene, masks, gowns, and gloves were used   Procedure Description Bronchoscope advanced through endotracheal tube and into airway.  Airways were examined down to subsegmental level with findings noted below.   Following diagnostic evaluation, BAL(s) performed in RML and RUL with normal saline and return of non-bloody fluid  Findings: Normal airway exam. Scant amount of pink tinged secretions in the left lower lobe. No endobronchial lesions noted. Non-bloody BAL samples obtained.    Complications/Tolerance None; patient tolerated the procedure well. Chest X-ray is not needed post procedure.   EBL Minimal   Specimen(s) BAL Right Upper Lobe BAL Right Middle Lobe

## 2020-05-26 NOTE — Progress Notes (Signed)
BROCKTON MCKESSON   DOB:1935/04/14   LK#:[id number]   DGU#:[id number]  Subjective:  Peter Wood is comfortable on the gurney in the ED, O2 on by [place]. Denies cough, no phlegm, no pleurisy. No family in room    Objective: older African American man examined in bed Vitals:   05/26/20 0700 05/26/20 0738  BP: 136/85 136/85  Pulse:  99  Resp: (!) 30 (!) 29  Temp:    SpO2: 96% 92%    Body mass index is 23.17 kg/m. No intake or output data in the 24 hours ending 05/26/20 0841   Sclerae unicteric  Lungs no rales or wheezes--auscultated anterolaterally  Heart regular rate and rhythm  Abdomen soft, +BS  Neuro nonfocal   CBG (last 3)  No results for input(s): GLUCAP in the last 72 hours.   Labs:  Lab Results  Component Value Date   WBC 11.4 (H) 05/26/2020   HGB 9.7 (L) 05/26/2020   HCT 29.6 (L) 05/26/2020   MCV 104.2 (H) 05/26/2020   PLT 305 05/26/2020   NEUTROABS 7.8 (H) 05/25/2020    _0 @  Urine Studies No results for input(s): UHGB, CRYS in the last 72 hours.  Invalid input(s): UACOL, UAPR, USPG, UPH, UTP, UGL, UKET, UBIL, UNIT, UROB, ULEU, UEPI, UWBC, URBC, UBAC, CAST, UCOM, BILUA  Basic Metabolic Panel: Recent Labs  Lab 05/25/20 1552 05/26/20 0448  NA 133* 135  K 4.1 3.9  CL 96* 100  CO2 29 26  GLUCOSE 94 94  BUN 16 14  CREATININE 0.80 0.71  CALCIUM 9.1 8.3*   GFR Estimated Creatinine Clearance: 59.6 mL/min (by C-G formula based on SCr of 0.71 mg/dL). Liver Function Tests: Recent Labs  Lab 05/25/20 1552 05/26/20 0448  AST 116* 79*  ALT 86* 63*  ALKPHOS 533* 386*  BILITOT 1.1 0.9  PROT 6.8 6.1*  ALBUMIN 2.2* 2.2*   No results for input(s): LIPASE, AMYLASE in the last 168 hours. No results for input(s): AMMONIA in the last 168 hours. Coagulation profile Recent Labs  Lab 05/26/20 0448  INR 1.5*    CBC: Recent Labs  Lab 05/25/20 1552 05/26/20 0448  WBC 10.0 11.4*  NEUTROABS 7.8*  --   HGB 10.4* 9.7*  HCT 31.3* 29.6*  MCV 101.6*  104.2*  PLT 318 305   Cardiac Enzymes: No results for input(s): CKTOTAL, CKMB, CKMBINDEX, TROPONINI in the last 168 hours. BNP: Invalid input(s): POCBNP CBG: No results for input(s): GLUCAP in the last 168 hours. D-Dimer Recent Labs    05/25/20 1805  DDIMER >20.00*   Hgb A1c No results for input(s): HGBA1C in the last 72 hours. Lipid Profile Recent Labs    05/25/20 1805  TRIG 89   Thyroid function studies No results for input(s): TSH, T4TOTAL, T3FREE, THYROIDAB in the last 72 hours.  Invalid input(s): FREET3 Anemia work up Recent Labs    05/25/20 1805  FERRITIN 1,013*   Microbiology Recent Results (from the past 240 hour(s))  Culture, Blood     Status: None (Preliminary result)   Collection Time: 05/25/20  3:52 PM   Specimen: BLOOD  Result Value Ref Range Status   Specimen Description BLOOD LEFT ANTECUBITAL  Final   Special Requests   Final    BOTTLES DRAWN AEROBIC AND ANAEROBIC Blood Culture results may not be optimal due to an excessive volume of blood received in culture bottles Performed at [facility] [address], [place], [place] [address]    Culture PENDING  Incomplete   Report  Status PENDING  Incomplete  SARS Coronavirus 2 by RT PCR (hospital order, performed in [facility] hospital lab) Nasopharyngeal Nasopharyngeal Swab     Status: None   Collection Time: 05/25/20  6:05 PM   Specimen: Nasopharyngeal Swab  Result Value Ref Range Status   SARS Coronavirus 2 NEGATIVE NEGATIVE Final    Comment: (NOTE) SARS-CoV-2 target nucleic acids are NOT DETECTED.  The SARS-CoV-2 RNA is generally detectable in upper and lower respiratory specimens during the acute phase of infection. The lowest concentration of SARS-CoV-2 viral copies this assay can detect is 250 copies / mL. A negative result does not preclude SARS-CoV-2 infection and should not be used as the sole basis for treatment or other patient management decisions.  A negative result may occur  with improper specimen collection / handling, submission of specimen other than nasopharyngeal swab, presence of viral mutation(s) within the areas targeted by this assay, and inadequate number of viral copies (<250 copies / mL). A negative result must be combined with clinical observations, patient history, and epidemiological information.  Fact Sheet for Patients:   StrictlyIdeas.no  Fact Sheet for Healthcare Providers: BankingDealers.co.za  This test is not yet approved or  cleared by the [place] FDA and has been authorized for detection and/or diagnosis of SARS-CoV-2 by FDA under an Emergency Use Authorization (EUA).  This EUA will remain in effect (meaning this test can be used) for the duration of the COVID-19 declaration under Section 564(b)(1) of the Act, 21 U.S.C. section 360bbb-3(b)(1), unless the authorization is terminated or revoked sooner.  Performed at [facility], [place] [address], [place], [place] [address]   MRSA PCR Screening     Status: None   Collection Time: 05/25/20 11:45 PM   Specimen: Nasal Mucosa; Nasopharyngeal  Result Value Ref Range Status   MRSA by PCR NEGATIVE NEGATIVE Final    Comment:        The GeneXpert MRSA Assay (FDA approved for NASAL specimens only), is one component of a comprehensive MRSA colonization surveillance program. It is not intended to diagnose MRSA infection nor to guide or monitor treatment for MRSA infections. Performed at [facility], [place] [address] [place], [place] [address]       Studies:  DG Chest 2 View  Result Date: 05/25/2020 CLINICAL DATA:  Cough and fever.  Reported known prostate carcinoma EXAM: CHEST - 2 VIEW COMPARISON:  April 28, 2020 FINDINGS: There is widespread multifocal airspace opacity throughout the lungs bilaterally, most notably in the right upper lobe. There is an equivocal right pleural effusion. Heart  is upper normal in size with pulmonary vascularity normal. Port-A-Cath tip is in the superior vena cava. No appreciable adenopathy. No blastic or lytic bone lesions are appreciable by radiography. IMPRESSION: Widespread airspace opacity bilaterally, more severe on the right than on the left. Suspect atypical organism pneumonia given this overall appearance. Lymphangitic spread of tumor conceivably could present in this manner but is felt to be less likely given this overall appearance. No well-defined nodular opacities noted within the lungs. Port-A-Cath tip in superior vena cava. No adenopathy evident by radiography. Heart upper normal in size. These results will be called to the ordering clinician or representative by the Radiologist Assistant, and communication documented in the PACS or [employer]. Electronically Signed   By: Lowella Grip III M.D.   On: 05/25/2020 15:29   CT Angio Chest PE W and/or Wo Contrast  Result Date: 05/25/2020 CLINICAL DATA:  Dyspnea, fevers, elevated  D-dimer EXAM: CT ANGIOGRAPHY CHEST WITH CONTRAST TECHNIQUE: Multidetector CT imaging of the chest was performed using the standard protocol during bolus administration of intravenous contrast. Multiplanar CT image reconstructions and MIPs were obtained to evaluate the vascular anatomy. CONTRAST:  52m OMNIPAQUE IOHEXOL 350 MG/ML SOLN COMPARISON:  None. FINDINGS: Cardiovascular: Right internal jugular chest port tip noted within the right atrium. Global cardiac size within normal limits. No significant coronary artery calcification. Small simple appearing pericardial effusion without CT evidence of cardiac tamponade. Central pulmonary arterial enlargement in keeping with changes of pulmonary artery hypertension. There is excellent opacification of the central pulmonary arterial tree without evidence of filling defect to suggest acute pulmonary embolism. The thoracic aorta is unremarkable. Mediastinum/Nodes: No pathologic  thoracic adenopathy. Lungs/Pleura: There are extensive bilateral asymmetric airspace infiltrates demonstrating an upper and mid lung zone predominance most in keeping with changes of atypical infection in the acute setting. Small right pleural effusion is present. No pneumothorax. The central airways are widely patent. Upper Abdomen: Low-attenuation lesion is seen within the left hepatic lobe, poorly characterized on this examination. Musculoskeletal: No chest wall abnormality. No acute or significant osseous findings. Review of the MIP images confirms the above findings. IMPRESSION: 1. No evidence of acute pulmonary embolism. 2. Extensive bilateral asymmetric airspace infiltrates demonstrating an upper and mid lung zone predominance most in keeping with changes of atypical infection in the acute setting. Serologic correlation for COVID-19 pneumonia may be helpful for further evaluation. 3. Small right pleural effusion. 4. Small simple appearing pericardial effusion without CT evidence of cardiac tamponade. 5. Central pulmonary arterial enlargement in keeping with changes of pulmonary artery hypertension. Electronically Signed   By: AFidela SalisburyMD   On: 05/25/2020 22:05    Assessment: 85y.o. [place] status post gastric biopsy 09/20/2018 showing adenocarcinoma, with a CA 19-9 greater than 65,000; staging studies showing an apparent mass adjacent to the head of the pancreas, innumerable liver lesions, upper abdominal mesenteric and peripancreatic lymphadenopathy, and an isolated sclerotic density in the left iliac bone             (a) genomics requested on gastric biopsy showed the tumor to be HER-2 amplified at 3+, PD-L1 positive, and T p53 mutated.  MSI is stable, mismatch repair status is proficient and the tumor mutational burden is intermediate.  (1) chest CT scan 09/19/2018 shows right lower lobe pulmonary embolism             (a) on intravenous heparin started 09/19/2018, transitioned to  apixaban 10/01/2018  (2) genetics testing 10/04/2018: negative             (a) family history of prostate and early breast cancer; patient has a history of prostate cancer  (3) started gemcitabine/Abraxane 10/09/2018, repeated days 1, 8 and 15 of each 28-day cycle             (a) stopped after 1 cycle (3 doses) with reassessment of diagnosis based on CARIS results, noted above  (4) started oxaliplatin, capecitabine and trastuzumab 11/06/2018             (a) baseline echocardiogram 09/20/2018 shows an ejection fraction in the 55-60% range             (b) oxaliplatin and trastuzumab every 21 days started 11/06/2018             (c) capecitabine dose 1000 mg twice daily for 14 days of every 21-day cycle             (  d) CT of the abdomen and pelvis 02/11/2019 shows marked improvement in his liver lesions and resolution of periportal adenopathy             (e) tumor markers also show significant response,             (f) echocardiogram 02/12/2019 shows an ejection fraction in the 60-65% range             (g) CT abd/pelvis show stable/ improved disease, tumor markers nearly normal             (h) oxaliplatin stopped aftwer 04/30/2019 dose             (i) capecitabine stopped after September cycle              (5) trastuzumab maintenance started 05/20/2019, last dose 07/22/2019, with progression  (6) disease progression documented by lab work and CT scan 07/16/2019             (a) resumed FOLFOX 07/29/2019, continuing trastuzumab (every 14 days).             (b) restaging studies after 3 cycles showed evidence of progression (tumor markers equivocal)             (c) FOLFOX discontinued 08/26/2019  (7) started T-DM1 09/30/2019, repeat every 21 days             (a) pembrolizumab added 12/02/2019             (b) echocardiogram 08/25/2019 showed an ejection fraction in the 60-65% range             (c) repeat CT of the abdomen 12/11/2019 shows growth in 2 liver lesions  (8) started [facility]  12/23/2019 at 5.4 mg/kg.             (a) dose increased to 6.1 mg/kg with the third dose and two 6.4 (target dose) 03/17/2020                  (b) Changed to every 4 weeks after receiving 04/28/2020 dose (which was his most recent)  (9) iron deficiency anemia:             (a) received Feraheme 04/22 /2021 and 01/08/2020      (10) pneumonia/ pneumonitis?  (a) patchy infiltrates on CXR 04/28/2020   (i) COVID-19 negative   (ii) received antibodies as outpatient   (iii) s/p Z=pack  (b) worsening cough, SOB and fever   (i) CT angio 05/25/2020 no PE, worsening bilateral infiltrates c/w atypical infection   (ii) vancomycin, unasyn started 05/25/2020   Plan:  Damyon is stable on O2 this AM; ID and PUL consults pending; discussed workup so far with him.  From a cancer point of view Yazid's cancer is not curable but it is treatable, though I am concerned we have progressed through several treatments. The fact that the tumor is HER2 positive gives [place] additional options.  I greatly appreciate your help to this patient. Will follow with you.   Chauncey Cruel, MD 05/26/2020  8:41 AM Medical Oncology and Hematology [facility] [address] [place], [place] [address] Tel. [phone]    Fax. [phone]

## 2020-05-26 NOTE — Progress Notes (Signed)
PHARMACY NOTE:  ANTIMICROBIAL RENAL DOSAGE ADJUSTMENT  Current antimicrobial regimen includes a mismatch between antimicrobial dosage and estimated renal function.  As per policy approved by the Pharmacy & Therapeutics and Medical Executive Committees, the antimicrobial dosage will be adjusted accordingly.  Current antimicrobial dosage:  unasyn 3gm IV q8h  Indication: PNA  Renal Function:  Estimated Creatinine Clearance: 59.6 mL/min (by C-G formula based on SCr of 0.71 mg/dL). []      On intermittent HD, scheduled: []      On CRRT    Antimicrobial dosage has been changed to:  3gm IV q6h for renal function and indication   Thank you for allowing pharmacy to be a part of this patient's care.  Lynelle Doctor, [facility] 05/26/2020 10:19 AM

## 2020-05-26 NOTE — Progress Notes (Signed)
Received pt from Endo. Awake, alert and oriented x4. Oxygen saturation on 4 l/min 84-86%. Attempted to place oxygen saturation probe on multiple different spots, but O2 sat reading still staying in mid 80's. Pt is tachypneic but states does not feel SOB. Able to speak without SOB. Continue to titrate oxygen up to HFNC. Continue to monitor. Pt also states that he has been having "trouble chewing and swallowing" but denies coughing with eating or drinking thin liquids. Eulas Post, RN

## 2020-05-26 NOTE — Progress Notes (Signed)
ANTICOAGULATION CONSULT NOTE - Follow Up Consult  Pharmacy Consult for heparin Indication: hx pulmonary embolus (home Eliquis on hold)  No Known Allergies  Patient Measurements: Height: 5\' 4"  (162.6 cm) Weight: 61.2 kg (135 lb) IBW/kg (Calculated) : 59.2 Heparin Dosing Weight: 61 kg  Vital Signs: BP: 133/79 (09/15 1000) Pulse Rate: 87 (09/15 1000)  Labs: Recent Labs    05/25/20 1552 05/25/20 1805 05/25/20 2130 05/26/20 0109 05/26/20 0448  HGB 10.4*  --   --   --  9.7*  HCT 31.3*  --   --   --  29.6*  PLT 318  --   --   --  305  APTT  --   --   --  42*  --   LABPROT  --   --   --   --  17.8*  INR  --   --   --   --  1.5*  HEPARINUNFRC  --   --   --  0.76*  --   CREATININE 0.80  --   --   --  0.71  TROPONINIHS  --  19* 17  --   --     Estimated Creatinine Clearance: 59.6 mL/min (by C-G formula based on SCr of 0.71 mg/dL).   Medications:  - on Eliquis 2.5 mg BID PTA (last dose taken on 9/13 at 2100)  Assessment: Patient's an 85 y.o M with stomach cancer with liver mets on enhertu PTA (received dose on 8/18) and hx PE in Jan 2020 on Eliquis PTA, presented to the ED from [facility] on 9/14 with fever and SOB.  CXR on 9/14 showed findings with concern for PNA.  Chest CTA on 9/14 was negative for PE, but showed extensive bilateral infiltrates, small right pleural effusion, and small pericardial effusion. He was transitioned to heparin on admission for bronchoscopy procedure on 9/15. Spoke to Dr. Erin Fulling, he said ok to resume heparin drip back s/p bronch at 5p on 9/15.  - 9/15: heparin drip turned off at 11a for bronch procedure   Today, 05/26/2020: - heparin level 0.67, aPTT 60 sec --> since the two levels are not correlating 2nd to recent use of Eliquis, will adjust heparin rate based on aPTT for now - hgb down slightly to 9.7, plts ok - no bleeding documented  Goal of Therapy:  Heparin level 0.3-0.7 units/ml aPTT 66-102 seconds Monitor platelets by anticoagulation protocol:  Yes   Plan:  - resume heparin drip back at 1050 units/hr at 5p - check 8 hr heparin level and aPTT - monitor for s/s bleeding  Damany Eastman P 05/26/2020,10:20 AM

## 2020-05-26 NOTE — Progress Notes (Signed)
ANTICOAGULATION CONSULT NOTE - Initial Consult  Pharmacy Consult for Heparin Indication: Pulmonary Embolism (on Eliquis PTA) - bridge therapy for pending bronch  No Known Allergies  Patient Measurements: Height: 5\' 4"  (162.6 cm) Weight: 61.2 kg (135 lb) IBW/kg (Calculated) : 59.2 Heparin Dosing Weight: actual body weight  Vital Signs: Temp: 99.2 F (37.3 C) (09/14 1739) Temp Source: Oral (09/14 1739) BP: 127/81 (09/15 0100) Pulse Rate: 93 (09/15 0100)  Labs: Recent Labs    05/25/20 1552 05/25/20 1805 05/25/20 2130  HGB 10.4*  --   --   HCT 31.3*  --   --   PLT 318  --   --   CREATININE 0.80  --   --   TROPONINIHS  --  19* 17    Estimated Creatinine Clearance: 59.6 mL/min (by C-G formula based on SCr of 0.8 mg/dL).   Medical History: Past Medical History:  Diagnosis Date  . Arthritis   . Diverticulosis of colon   . Family history of breast cancer   . Family history of prostate cancer   . HTN (hypertension)   . Hx of colonic polyp   . Pneumonia    as a child/baby  . Poor circulation of extremity    right leg  . Prostate cancer [facility]) 2015   prostate cancer - radiation treated    Medications:  PTA Eliquis 2.5mg  po BID - LD 9/13 @ 2100  Assessment:  85 yr male presents with fever and worsening shortness of breath  PMH significant for HTN, PE (2020), metastatic gastric adenocarcinoma  Pharmacy consulted to dose IV heparin while Eliquis held for pending bronch  Will initially monitor heparin with aPTT until effects of Eliquis in falsely elevating heparin level have worn off  Goal of Therapy:  Heparin level 0.3-0.7 units/ml aPTT 66-102 seconds Monitor platelets by anticoagulation protocol: Yes   Plan:   Begin IV heparin gtt @ 1000 units/hr  Check aPTT 8 hr after IV heparin started  Follow CBC and heparin level daily, as well as aPTT until effects of Eliquis have worn off  Letisha Yera, Toribio Harbour, PharmD 05/26/2020,1:11 AM

## 2020-05-26 NOTE — Transfer of Care (Signed)
Immediate Anesthesia Transfer of Care Note  Patient: Peter Wood  Procedure(s) Performed: VIDEO BRONCHOSCOPY WITHOUT FLUORO (N/A )  Patient Location: PACU  Anesthesia Type:General  Level of Consciousness: awake, alert  and oriented  Airway & Oxygen Therapy: Patient Spontanous Breathing and Patient connected to face mask oxygen  Post-op Assessment: Report given to RN and Post -op Vital signs reviewed and stable  Post vital signs: Reviewed and stable  Last Vitals:  Vitals Value Taken Time  BP 144/86 05/26/20 1400  Temp    Pulse 110 05/26/20 1402  Resp 35 05/26/20 1402  SpO2 85 % 05/26/20 1402  Vitals shown include unvalidated device data.  Last Pain:  Vitals:   05/26/20 1234  TempSrc: Axillary  PainSc:          Complications: No complications documented.

## 2020-05-26 NOTE — Progress Notes (Signed)
PROGRESS NOTE  Peter Wood CBJ:[id number] DOB: Feb 08, 1935 DOA: 05/25/2020 PCP: Cassandria Anger, MD  HPI/Recap of past 36 hours: 85 year old male with past medical history of PE in 2020, hypertension, gastric adenocarcinoma with metastases to liver and bone that was diagnosed in January 2020 status post chemotherapy with good response slow progression who was referred from the oncology clinic for evaluation of persistent fever and dyspnea and presented to the emergency room on 9/14.  Symptoms that started apparently 3 to 4 weeks ago.  Chest x-ray that was done on 8/18 noted bilateral infiltrates however Covid PCR negative.  Despite negative Covid test and given his high risk, patient received monoclonal antibody treatment.  This was followed by a course of Zithromax without improvement.  Came in today after temperature of 101.5 and chest x-ray done noted persistent multifocal airspace opacities with a differential of atypical pneumonia versus lymphangitic spread of tumor.  Repeat Covid PCR negative.  Admitted to the hospital service.  Pulmonary consulted and patient underwent bronchoscopy with biopsy done at noon on 9/15.  Patient seen prior to bronchoscopy.  Tired and a little hungry and thirsty, but otherwise no complaints.  Assessment/Plan: Active Problems:   Essential hypertension: Blood pressure stable.    Pulmonary embolism ([place]): Eliquis on hold.  Bridging with heparin and was stopped prior to bronchoscopy.  Resume Eliquis once okay by pulmonary.    Metastasis from malignant tumor of stomach ([place]) to liver: Following transaminases.    Iron deficiency anemia due to chronic blood loss   Multifocal pneumonia versus lymphangitic spread of tumor: Awaiting biopsy results.  On IV Unasyn and vancomycin.    Hypoalbuminemia: Nutrition consult pending  Code Status: Full code, although likely patient will need a palliative care discussion  Family Communication: Left message for  wife  Disposition Plan: Depending on findings of biopsies   Consultants:  Pulmonary  Discussed with infectious disease  Oncology  Procedures:  Status post bronchoscopy done 9/15 with biopsies taken, results pending  Antimicrobials:  IV Unasyn and vancomycin 9/15-present  DVT prophylaxis: Heparin   Objective: Vitals:   05/26/20 1454 05/26/20 1500  BP:    Pulse: 97 100  Resp: (!) 38 (!) 29  Temp:    SpO2: 95% 91%    Intake/Output Summary (Last 24 hours) at 05/26/2020 1537 Last data filed at 05/26/2020 1340 Gross per 24 hour  Intake 400 ml  Output 0 ml  Net 400 ml   Filed Weights   05/25/20 1744 05/26/20 1234  Weight: 61.2 kg 61.2 kg   Body mass index is 23.17 kg/m.  Exam:   General: Alert and oriented x3, no acute distress  HEENT: Normocephalic atraumatic, mucous membranes are slightly dry  Cardiovascular: Regular rate and rhythm, S1-S2, borderline tachycardia  Respiratory: Fine crackles bilaterally, mild labored breathing  Abdomen: Soft, non-tender, non-distended, positive bowel sounds  Musculoskeletal: No clubbing or cyanosis or edema  Skin: No skin breaks, tears or lesions  Neuro: No focal deficits  Psychiatry: Appropriate, no evidence of psychoses   Data Reviewed: CBC: Recent Labs  Lab 05/25/20 1552 05/26/20 0448  WBC 10.0 11.4*  NEUTROABS 7.8*  --   HGB 10.4* 9.7*  HCT 31.3* 29.6*  MCV 101.6* 104.2*  PLT 318 160   Basic Metabolic Panel: Recent Labs  Lab 05/25/20 1552 05/26/20 0448  NA 133* 135  K 4.1 3.9  CL 96* 100  CO2 29 26  GLUCOSE 94 94  BUN 16 14  CREATININE 0.80 0.71  CALCIUM 9.1  8.3*   GFR: Estimated Creatinine Clearance: 59.6 mL/min (by C-G formula based on SCr of 0.71 mg/dL). Liver Function Tests: Recent Labs  Lab 05/25/20 1552 05/26/20 0448  AST 116* 79*  ALT 86* 63*  ALKPHOS 533* 386*  BILITOT 1.1 0.9  PROT 6.8 6.1*  ALBUMIN 2.2* 2.2*   No results for input(s): LIPASE, AMYLASE in the last 168  hours. No results for input(s): AMMONIA in the last 168 hours. Coagulation Profile: Recent Labs  Lab 05/26/20 0448  INR 1.5*   Cardiac Enzymes: No results for input(s): CKTOTAL, CKMB, CKMBINDEX, TROPONINI in the last 168 hours. BNP (last 3 results) No results for input(s): PROBNP in the last 8760 hours. HbA1C: No results for input(s): HGBA1C in the last 72 hours. CBG: No results for input(s): GLUCAP in the last 168 hours. Lipid Profile: Recent Labs    05/25/20 1805  TRIG 89   Thyroid Function Tests: Recent Labs    05/25/20 1554  TSH 0.538   Anemia Panel: Recent Labs    05/25/20 1805  FERRITIN 1,013*   Urine analysis:    Component Value Date/Time   COLORURINE YELLOW 02/18/2019 1056   APPEARANCEUR CLEAR 02/18/2019 1056   LABSPEC 1.018 02/18/2019 1056   PHURINE 6.0 02/18/2019 1056   GLUCOSEU NEGATIVE 02/18/2019 1056   GLUCOSEU NEGATIVE 07/20/2017 1056   HGBUR NEGATIVE 02/18/2019 1056   [place] 02/18/2019 1056   [place] 02/18/2019 1056   PROTEINUR 30 (A) 02/18/2019 1056   UROBILINOGEN 0.2 07/20/2017 1056   NITRITE NEGATIVE 02/18/2019 1056   LEUKOCYTESUR NEGATIVE 02/18/2019 1056   Sepsis Labs: @LABRCNTIP (procalcitonin:4,lacticidven:4)  ) Recent Results (from the past 240 hour(s))  Culture, Blood     Status: None (Preliminary result)   Collection Time: 05/25/20  3:52 PM   Specimen: BLOOD  Result Value Ref Range Status   Specimen Description BLOOD LEFT ANTECUBITAL  Final   Special Requests   Final    BOTTLES DRAWN AEROBIC AND ANAEROBIC Blood Culture results may not be optimal due to an excessive volume of blood received in culture bottles   Culture   Final    NO GROWTH < 24 HOURS Performed at [facility], [place] [address], [place], [place] [address]    Report Status PENDING  Incomplete  Culture, Blood     Status: None (Preliminary result)   Collection Time: 05/25/20  4:48 PM   Specimen: BLOOD  Result Value Ref Range Status    Specimen Description BLOOD PORTA CATH  Final   Special Requests   Final    BOTTLES DRAWN AEROBIC AND ANAEROBIC Blood Culture adequate volume   Culture   Final    NO GROWTH < 24 HOURS Performed at [facility], [place] [address] [place], [place] [address]    Report Status PENDING  Incomplete  SARS Coronavirus 2 by RT PCR (hospital order, performed in [facility] hospital lab) Nasopharyngeal Nasopharyngeal Swab     Status: None   Collection Time: 05/25/20  6:05 PM   Specimen: Nasopharyngeal Swab  Result Value Ref Range Status   SARS Coronavirus 2 NEGATIVE NEGATIVE Final    Comment: (NOTE) SARS-CoV-2 target nucleic acids are NOT DETECTED.  The SARS-CoV-2 RNA is generally detectable in upper and lower respiratory specimens during the acute phase of infection. The lowest concentration of SARS-CoV-2 viral copies this assay can detect is 250 copies / mL. A negative result does not preclude SARS-CoV-2 infection and should not be used as the sole basis for  treatment or other patient management decisions.  A negative result may occur with improper specimen collection / handling, submission of specimen other than nasopharyngeal swab, presence of viral mutation(s) within the areas targeted by this assay, and inadequate number of viral copies (<250 copies / mL). A negative result must be combined with clinical observations, patient history, and epidemiological information.  Fact Sheet for Patients:   StrictlyIdeas.no  Fact Sheet for Healthcare Providers: BankingDealers.co.za  This test is not yet approved or  cleared by the [place] FDA and has been authorized for detection and/or diagnosis of SARS-CoV-2 by FDA under an Emergency Use Authorization (EUA).  This EUA will remain in effect (meaning this test can be used) for the duration of the COVID-19 declaration under Section 564(b)(1) of the Act, 21 U.S.C. section 360bbb-3(b)(1),  unless the authorization is terminated or revoked sooner.  Performed at [facility], [place] [address], [place], [place] [address]   MRSA PCR Screening     Status: None   Collection Time: 05/25/20 11:45 PM   Specimen: Nasal Mucosa; Nasopharyngeal  Result Value Ref Range Status   MRSA by PCR NEGATIVE NEGATIVE Final    Comment:        The GeneXpert MRSA Assay (FDA approved for NASAL specimens only), is one component of a comprehensive MRSA colonization surveillance program. It is not intended to diagnose MRSA infection nor to guide or monitor treatment for MRSA infections. Performed at [facility], [place] [address], [place], [place] [address]       Studies: CT Angio Chest PE W and/or Wo Contrast  Result Date: 05/25/2020 CLINICAL DATA:  Dyspnea, fevers, elevated D-dimer EXAM: CT ANGIOGRAPHY CHEST WITH CONTRAST TECHNIQUE: Multidetector CT imaging of the chest was performed using the standard protocol during bolus administration of intravenous contrast. Multiplanar CT image reconstructions and MIPs were obtained to evaluate the vascular anatomy. CONTRAST:  45mL OMNIPAQUE IOHEXOL 350 MG/ML SOLN COMPARISON:  None. FINDINGS: Cardiovascular: Right internal jugular chest port tip noted within the right atrium. Global cardiac size within normal limits. No significant coronary artery calcification. Small simple appearing pericardial effusion without CT evidence of cardiac tamponade. Central pulmonary arterial enlargement in keeping with changes of pulmonary artery hypertension. There is excellent opacification of the central pulmonary arterial tree without evidence of filling defect to suggest acute pulmonary embolism. The thoracic aorta is unremarkable. Mediastinum/Nodes: No pathologic thoracic adenopathy. Lungs/Pleura: There are extensive bilateral asymmetric airspace infiltrates demonstrating an upper and mid lung zone predominance most in keeping with changes  of atypical infection in the acute setting. Small right pleural effusion is present. No pneumothorax. The central airways are widely patent. Upper Abdomen: Low-attenuation lesion is seen within the left hepatic lobe, poorly characterized on this examination. Musculoskeletal: No chest wall abnormality. No acute or significant osseous findings. Review of the MIP images confirms the above findings. IMPRESSION: 1. No evidence of acute pulmonary embolism. 2. Extensive bilateral asymmetric airspace infiltrates demonstrating an upper and mid lung zone predominance most in keeping with changes of atypical infection in the acute setting. Serologic correlation for COVID-19 pneumonia may be helpful for further evaluation. 3. Small right pleural effusion. 4. Small simple appearing pericardial effusion without CT evidence of cardiac tamponade. 5. Central pulmonary arterial enlargement in keeping with changes of pulmonary artery hypertension. Electronically Signed   By: Fidela Salisbury MD   On: 05/25/2020 22:05    Scheduled Meds: . [MAR Hold] amLODipine  5 mg Oral Daily  . [MAR Hold] benazepril  20 mg Oral Daily  . [MAR Hold] latanoprost  1 drop Both Eyes QHS    Continuous Infusions: . [MAR Hold] ampicillin-sulbactam (UNASYN) IV    . heparin Stopped (05/26/20 1226)     LOS: 0 days     Annita Brod, MD [facility]   05/26/2020, 3:37 PM

## 2020-05-26 NOTE — Anesthesia Preprocedure Evaluation (Addendum)
Anesthesia Evaluation  Patient identified by MRN, date of birth, ID band Patient awake    Reviewed: Allergy & Precautions, NPO status , Patient's Chart, lab work & pertinent test results  Airway Mallampati: II  TM Distance: >3 FB Neck ROM: Full    Dental  (+) Dental Advisory Given   Pulmonary pneumonia, former smoker,    breath sounds clear to auscultation       Cardiovascular hypertension, Pt. on medications + Peripheral Vascular Disease   Rhythm:Regular Rate:Normal     Neuro/Psych negative neurological ROS     GI/Hepatic Neg liver ROS, Gastric CA    Endo/Other  negative endocrine ROS  Renal/GU negative Renal ROS     Musculoskeletal  (+) Arthritis ,   Abdominal   Peds  Hematology  (+) anemia ,   Anesthesia Other Findings   Reproductive/Obstetrics                             Anesthesia Physical Anesthesia Plan  ASA: IV  Anesthesia Plan: General   Post-op Pain Management:    Induction: Intravenous  PONV Risk Score and Plan: 2 and Dexamethasone, Ondansetron and Treatment may vary due to age or medical condition  Airway Management Planned: Oral ETT  Additional Equipment: None  Intra-op Plan:   Post-operative Plan: Possible Post-op intubation/ventilation  Informed Consent: I have reviewed the patients History and Physical, chart, labs and discussed the procedure including the risks, benefits and alternatives for the proposed anesthesia with the patient or authorized representative who has indicated his/her understanding and acceptance.     Dental advisory given  Plan Discussed with: CRNA  Anesthesia Plan Comments:        Anesthesia Quick Evaluation

## 2020-05-26 NOTE — Consult Note (Addendum)
NAME:  Peter Wood, MRN:  [id number], DOB:  1935/04/01, LOS: 0 ADMISSION DATE:  05/25/2020, CONSULTATION DATE:  05/26/20 REFERRING MD: Dr. Maryland Pink, CHIEF COMPLAINT:  SOB, Fever   Brief History   85 y/o M admitted 9/14 with reports of shortness of breath and fever.    History of present illness   85 y/o M who presented to [facility] ER on 9/14 from the cancer center with reports of fever and shortness of breath.    He has a history of gastric adenocarcinoma with metastatic disease to liver and bone (initial diagnosis 09/2018) s/p chemotherapy with slow progression on Enhertu (last dose 04/28/20).   He had a CXR on 8/18 that demonstrated bilateral infiltrates.  COVID testing was negative at that time.  However, he was treated with monoclonal antibody (casirivimab / imdevimab) on 8/19 followed by a course of azithromycin without significant improvement.    On presentation 9/14 he reported 3-4 weeks of shortness of breath, cough with yellow sputum production and recurrent fevers. Initial ER work up notable for CTA that was negative for PE but showed extensive bilateral asymmetric opacities.  Labs - Na 133, K 4.1, BUN 16, Sr Cr 0.8, albumin 2.2, AST 116, ALT 86, [name] 533, WBC 10, Hgb 10.4, platelets 318, BNP 54, COVID negative, lactic acid 1.4, D-Dimer >20, LDH 313, Ferritin 1013, CRP 17.8.  EKG without ST changes.  He reports weight loss, fatigue and decreased activity tolerance.   PCCM consulted for evaluation.    Past Medical History  HTN PE - on eliquis  Prostate Cancer - s/p radiation treatment  Gastric adenocarcinoma with metastatic disease to liver and bone, initial dx 09/2018  Significant Hospital Events   9/14 Admit 9/15 PCCM consulted for pulmonary evaluation   Consults:    Procedures:    Significant Diagnostic Tests:  CTA Chest 9/15 >> negative for PE, extensive bilateral asymmetric airspace opacities, small right pleural effusion, small simple appearing pericardial effusion  without CT evidence of tamponade, central pulmonary arterial enlargement FOB 9/15 >> clear airways, minimal secretions, scant bloody tinged secretions in LLL  Micro Data:  COVID 9/14 >>  BAL 9/15 >>  AFB 9/15 >>  PCP 9/15 >>  Antimicrobials:  Unasyn 9/15 >>   Interim history/subjective:  Tmax 99.2  Pt denies acute pain.  Reports mild shortness of breath.   Objective   Blood pressure 136/85, pulse 99, temperature 99.2 F (37.3 C), temperature source Oral, resp. rate (!) 29, height _0  (1.626 m), weight 61.2 kg, SpO2 92 %.       No intake or output data in the 24 hours ending 05/26/20 0835 Filed Weights   05/25/20 1744  Weight: 61.2 kg    Examination: General: pleasant elderly adult male lying on ER stretcher in NAD HEENT: MM pink/moist, mask in place  Neuro: AAOx4, speech clear, MAE CV: s1s2 RRR, no m/r/g PULM: non-labored on [place] O2, lungs bilaterally diminished  GI: soft, bsx4 active  Extremities: warm/dry, BLE 2+ pitting edema  Skin: no rashes or lesions  Resolved Hospital Problem list      Assessment & Plan:    Acute Hypoxic Respiratory Failure in setting of Diffuse Bilateral Infiltrates  Fever  Hx Pulmonary Embolism on Eliquis  Recent hx of negative COVID testing, treated with monoclonal antibodies & azithromycin.  Differential dx includes viral / bacterial infection, component pulmonary edema with noted central pulmonary arterial enlargement, BOOP/COP, pneumonitis / ILD associated with trastuzumab and lymphangitic spread of malignancy. CT angio negative  for PE and he is on chronic anticoagulation for prior PE. Minimal secretions on bronchoscopy.  -continue heparin gtt for now, likely will be ok to transition back to oral agents in am  -wean O2 for sats >90% -follow BAL  -pending BAL results, consider steroids -empiric unasyn  -monitor fever curve / wbc trend  -follow intermittent CXR  -tessalon PRN for cough  Best practice:  Diet: As tolerated, per  primary  Pain/Anxiety/Delirium protocol (if indicated): n/a  VAP protocol (if indicated): n/a  DVT prophylaxis: heparin gtt  GI prophylaxis: per primary  Glucose control: per primary  Mobility: as tolerated  Code Status: Full Code. Introduced concept of mechanical ventilation, CPR and asked about his thoughts. He indicates his wife would be his decision maker along with his children.  He has considered the process of end of life and has been reviewing paper work but has not made formal decisions.  Wants to think about goals in the situation of end of life.  Family Communication: Patient updated on plan of care  Disposition: Per primary   Labs   CBC: Recent Labs  Lab 05/25/20 1552 05/26/20 0448  WBC 10.0 11.4*  NEUTROABS 7.8*  --   HGB 10.4* 9.7*  HCT 31.3* 29.6*  MCV 101.6* 104.2*  PLT 318 762    Basic Metabolic Panel: Recent Labs  Lab 05/25/20 1552 05/26/20 0448  NA 133* 135  K 4.1 3.9  CL 96* 100  CO2 29 26  GLUCOSE 94 94  BUN 16 14  CREATININE 0.80 0.71  CALCIUM 9.1 8.3*   GFR: Estimated Creatinine Clearance: 59.6 mL/min (by C-G formula based on SCr of 0.71 mg/dL). Recent Labs  Lab 05/25/20 1552 05/25/20 1805 05/25/20 2130 05/26/20 0448  PROCALCITON  --  1.96  --   --   WBC 10.0  --   --  11.4*  LATICACIDVEN  --  1.4 1.3  --     Liver Function Tests: Recent Labs  Lab 05/25/20 1552 05/26/20 0448  AST 116* 79*  ALT 86* 63*  ALKPHOS 533* 386*  BILITOT 1.1 0.9  PROT 6.8 6.1*  ALBUMIN 2.2* 2.2*   No results for input(s): LIPASE, AMYLASE in the last 168 hours. No results for input(s): AMMONIA in the last 168 hours.  ABG No results found for: PHART, PCO2ART, PO2ART, HCO3, TCO2, ACIDBASEDEF, O2SAT   Coagulation Profile: Recent Labs  Lab 05/26/20 0448  INR 1.5*    Cardiac Enzymes: No results for input(s): CKTOTAL, CKMB, CKMBINDEX, TROPONINI in the last 168 hours.  HbA1C: No results found for: HGBA1C  CBG: No results for input(s): GLUCAP in  the last 168 hours.  Review of Systems: Positives in [place]  Gen: Denies fever, chills, weight change, fatigue, night sweats HEENT: Denies blurred vision, double vision, hearing loss, tinnitus, sinus congestion, rhinorrhea, sore throat, neck stiffness, dysphagia PULM: Denies shortness of breath, cough, sputum production, hemoptysis, wheezing CV: Denies chest pain, edema, orthopnea, paroxysmal nocturnal dyspnea, palpitations GI: Denies abdominal pain, nausea, vomiting, diarrhea, hematochezia, melena, constipation, change in bowel habits GU: Denies dysuria, hematuria, polyuria, oliguria, urethral discharge Endocrine: Denies hot or cold intolerance, polyuria, polyphagia or appetite change Derm: Denies rash, dry skin, scaling or peeling skin change Heme: Denies easy bruising, bleeding, bleeding gums Neuro: Denies headache, numbness, weakness, slurred speech, loss of memory or consciousness   Past Medical History  He,  has a past medical history of Arthritis, Diverticulosis of colon, Family history of breast cancer, Family history of prostate cancer, HTN (  hypertension), colonic polyp, Pneumonia, Poor circulation of extremity, and Prostate cancer ([place]) (2015).   Surgical History    Past Surgical History:  Procedure Laterality Date  . [place] VITRECTOMY WITH 20 GAUGE MVR PORT FOR MACULAR HOLE Right 09/19/2016   Procedure: 25 GAUGE PARS PLANA VITRECTOMY WITH 20 GAUGE MVR PORT FOR MACULAR HOLE, MEMBRANE PEEL, SERUM PATCH, GAS FLUID EXCHANGE, HEADSCOPE LASER;  Surgeon: [name], MD;  Location: [place];  Service: Ophthalmology;  Laterality: Right;  . BIOPSY  09/20/2018   Procedure: BIOPSY;  Surgeon: Gatha Mayer, MD;  Location: [facility] ENDOSCOPY;  Service: Endoscopy;;  . CATARACT EXTRACTION  08/27/12   Right  . COLONOSCOPY    . ESOPHAGOGASTRODUODENOSCOPY (EGD) WITH PROPOFOL N/A 09/20/2018   Procedure: ESOPHAGOGASTRODUODENOSCOPY (EGD) WITH PROPOFOL;  Surgeon: Gatha Mayer, MD;   Location: [place];  Service: Endoscopy;  Laterality: N/A;  . HAND SURGERY  2016   right thumb  . IR IMAGING GUIDED PORT INSERTION  11/04/2018  . KNEE SURGERY Left 1989     Social History   reports that he quit smoking about 52 years ago. He quit after 4.00 years of use. He has never used smokeless tobacco. He reports that he does not drink alcohol and does not use drugs.   Family History   His family history includes Breast cancer in his sister; Hypertension in his mother; Prostate cancer in his brother and father; Ulcers in his brother, mother, and sister.   Allergies No Known Allergies   Home Medications  Prior to Admission medications   Medication Sig Start Date End Date Taking? Authorizing Provider  amLODipine (NORVASC) 5 MG tablet Take 1 tablet (5 mg total) by mouth daily. 12/11/19  Yes Plotnikov, Evie Lacks, MD  apixaban (ELIQUIS) 2.5 MG TABS tablet Take 1 tablet (2.5 mg total) by mouth 2 (two) times daily. 04/12/20  Yes Plotnikov, Evie Lacks, MD  benazepril (LOTENSIN) 40 MG tablet Take 0.5 tablets (20 mg total) by mouth daily. 04/12/20  Yes Plotnikov, Evie Lacks, MD  benzonatate (TESSALON) 100 MG capsule Take 1 capsule (100 mg total) by mouth 2 (two) times daily as needed for cough. 05/18/20  Yes Causey, Charlestine Massed, NP  Cholecalciferol 1000 UNITS tablet Take 1,000 Units by mouth daily.    Yes [provider]  latanoprost (XALATAN) 0.005 % ophthalmic solution Place 1 drop into both eyes at bedtime.  10/24/17  Yes [provider]  lidocaine-prilocaine (EMLA) cream Apply to affected area once 11/06/18  Yes Magrinat, Virgie Dad, MD  Misc Natural Products (AIRBORNE ELDERBERRY) CHEW Chew by mouth daily. X 2 Gummies   Yes [provider]  ondansetron (ZOFRAN) 4 MG tablet Take 1 tablet (4 mg total) by mouth every 8 (eight) hours as needed for nausea or vomiting. 10/01/18  Yes Plotnikov, Evie Lacks, MD  azithromycin (ZITHROMAX Z-PAK) 250 MG tablet Take as  directed Patient not taking: Reported on 05/25/2020 05/18/20   Gardenia Phlegm, NP  predniSONE (DELTASONE) 5 MG tablet Take 1 tablet by mouth daily with breakfast Patient not taking: Reported on 05/25/2020 03/16/20   Harle Stanford., PA-C  promethazine-codeine (PHENERGAN WITH CODEINE) 6.25-10 MG/5ML syrup Take 5 mLs by mouth every 8 (eight) hours as needed for cough. Patient not taking: Reported on 05/26/2020 05/24/20   Magrinat, Virgie Dad, MD  triamcinolone lotion (KENALOG) 0.1 % Apply 1 application topically 3 (three) times daily. Patient not taking: Reported on 05/26/2020 03/05/20   Harle Stanford., PA-C  Critical care time: n/a     Noe Gens, MSN, NP-C [facility] 05/26/2020, 8:35 AM   Please see [email or web address] for pager details.

## 2020-05-26 NOTE — Anesthesia Procedure Notes (Signed)
Procedure Name: Intubation Date/Time: 05/26/2020 1:27 PM Performed by: Talbot Grumbling, CRNA Pre-anesthesia Checklist: Patient identified, Emergency Drugs available, Suction available and Patient being monitored Patient Re-evaluated:Patient Re-evaluated prior to induction Oxygen Delivery Method: Circle system utilized Preoxygenation: Pre-oxygenation with 100% oxygen Induction Type: IV induction Ventilation: Mask ventilation without difficulty Laryngoscope Size: Mac and 3 Grade View: Grade I Tube type: Oral Tube size: 9.0 mm Number of attempts: 1 Airway Equipment and Method: Stylet Placement Confirmation: ETT inserted through vocal cords under direct vision,  positive ETCO2 and breath sounds checked- equal and bilateral Secured at: 22 cm Tube secured with: Tape Dental Injury: Teeth and Oropharynx as per pre-operative assessment

## 2020-05-26 NOTE — Progress Notes (Signed)
PHARMACY NOTE:  ANTIMICROBIAL RENAL DOSAGE ADJUSTMENT  Current antimicrobial regimen includes a mismatch between antimicrobial dosage and estimated renal function.  As per policy approved by the Pharmacy & Therapeutics and Medical Executive Committees, the antimicrobial dosage will be adjusted accordingly.  Current antimicrobial dosage:  Vancomycin 1250mg  IV q12h  Indication: pneumonia  Renal Function:  Estimated Creatinine Clearance: 59.6 mL/min (by C-G formula based on SCr of 0.8 mg/dL).     Antimicrobial dosage has been changed to:  Vancomycin 500mg  IV q12h   Thank you for allowing pharmacy to be a part of this patient's care.  Everette Rank, [facility] 05/26/2020 1:24 AM

## 2020-05-27 ENCOUNTER — Telehealth: Payer: Self-pay | Admitting: Internal Medicine

## 2020-05-27 ENCOUNTER — Encounter (HOSPITAL_COMMUNITY): Payer: Self-pay | Admitting: Pulmonary Disease

## 2020-05-27 DIAGNOSIS — C799 Secondary malignant neoplasm of unspecified site: Secondary | ICD-10-CM

## 2020-05-27 DIAGNOSIS — E8809 Other disorders of plasma-protein metabolism, not elsewhere classified: Secondary | ICD-10-CM

## 2020-05-27 DIAGNOSIS — I5032 Chronic diastolic (congestive) heart failure: Secondary | ICD-10-CM

## 2020-05-27 DIAGNOSIS — R070 Pain in throat: Secondary | ICD-10-CM

## 2020-05-27 DIAGNOSIS — J9601 Acute respiratory failure with hypoxia: Secondary | ICD-10-CM | POA: Diagnosis present

## 2020-05-27 HISTORY — DX: Chronic diastolic (congestive) heart failure: I50.32

## 2020-05-27 LAB — COMPREHENSIVE METABOLIC PANEL
ALT: 46 U/L — ABNORMAL HIGH (ref 0–44)
AST: 52 U/L — ABNORMAL HIGH (ref 15–41)
Albumin: 2 g/dL — ABNORMAL LOW (ref 3.5–5.0)
Alkaline Phosphatase: 358 U/L — ABNORMAL HIGH (ref 38–126)
Anion gap: 10 (ref 5–15)
BUN: 15 mg/dL (ref 8–23)
CO2: 26 mmol/L (ref 22–32)
Calcium: 8 mg/dL — ABNORMAL LOW (ref 8.9–10.3)
Chloride: 98 mmol/L (ref 98–111)
Creatinine, Ser: 0.75 mg/dL (ref 0.61–1.24)
GFR calc Af Amer: >60 mL/min (ref >60)
GFR calc non Af Amer: >60 mL/min (ref >60)
Glucose, Bld: 106 mg/dL — ABNORMAL HIGH (ref 70–99)
Potassium: 3.9 mmol/L (ref 3.5–5.1)
Sodium: 134 mmol/L — ABNORMAL LOW (ref 135–145)
Total Bilirubin: 1.5 mg/dL — ABNORMAL HIGH (ref 0.3–1.2)
Total Protein: 5.6 g/dL — ABNORMAL LOW (ref 6.5–8.1)

## 2020-05-27 LAB — CBC
HCT: 28 % — ABNORMAL LOW (ref 39.0–52.0)
Hemoglobin: 9.4 g/dL — ABNORMAL LOW (ref 13.0–17.0)
MCH: 34.6 pg — ABNORMAL HIGH (ref 26.0–34.0)
MCHC: 33.6 g/dL (ref 30.0–36.0)
MCV: 102.9 fL — ABNORMAL HIGH (ref 80.0–100.0)
Platelets: 303 10*3/uL (ref 150–400)
RBC: 2.72 MIL/uL — ABNORMAL LOW (ref 4.22–5.81)
RDW: 15.6 % — ABNORMAL HIGH (ref 11.5–15.5)
WBC: 15.2 10*3/uL — ABNORMAL HIGH (ref 4.0–10.5)
nRBC: 0.1 % (ref 0.0–0.2)

## 2020-05-27 LAB — PROTIME-INR
INR: 1.6 — ABNORMAL HIGH (ref 0.8–1.2)
Prothrombin Time: 18.8 seconds — ABNORMAL HIGH (ref 11.4–15.2)

## 2020-05-27 LAB — PROCALCITONIN: Procalcitonin: 11.09 ng/mL

## 2020-05-27 LAB — HEPARIN LEVEL (UNFRACTIONATED): Heparin Unfractionated: 0.58 IU/mL (ref 0.30–0.70)

## 2020-05-27 LAB — RESPIRATORY PANEL BY PCR

## 2020-05-27 LAB — PNEUMOCYSTIS JIROVECI SMEAR BY DFA
Pneumocystis jiroveci Ag: NEGATIVE
Pneumocystis jiroveci Ag: NEGATIVE

## 2020-05-27 LAB — APTT: aPTT: 109 seconds — ABNORMAL HIGH (ref 24–36)

## 2020-05-27 MED ORDER — HEPARIN (PORCINE) 25000 UT/250ML-% IV SOLN: 1000.0000 [IU]/h | INTRAVENOUS | Status: AC

## 2020-05-27 MED ORDER — BOOST PLUS PO LIQD
237.0000 mL | Freq: Three times a day (TID) | ORAL | Status: DC
  Administered 2020-05-27 – 2020-06-04 (×13): 237 mL via ORAL
  Filled 2020-05-27 (×30): qty 237

## 2020-05-27 MED ORDER — ADULT MULTIVITAMIN W/MINERALS CH
1.0000 | ORAL_TABLET | Freq: Every day | ORAL | Status: DC
  Administered 2020-05-27 – 2020-06-06 (×11): 1 via ORAL
  Filled 2020-05-27 (×11): qty 1

## 2020-05-27 MED ORDER — HEPARIN (PORCINE) 25000 UT/250ML-% IV SOLN: 950.0000 [IU]/h | INTRAVENOUS | Status: DC

## 2020-05-27 MED ORDER — DOCUSATE SODIUM 100 MG PO CAPS
200.0000 mg | ORAL_CAPSULE | Freq: Every day | ORAL | Status: DC
  Administered 2020-05-28 – 2020-06-06 (×9): 200 mg via ORAL
  Filled 2020-05-27 (×11): qty 2

## 2020-05-27 MED ORDER — POLYETHYLENE GLYCOL 3350 17 G PO PACK
17.0000 g | PACK | Freq: Every day | ORAL | Status: DC
  Administered 2020-05-27 – 2020-06-06 (×8): 17 g via ORAL
  Filled 2020-05-27 (×10): qty 1

## 2020-05-27 MED ORDER — METHYLPREDNISOLONE SODIUM SUCC 125 MG IJ SOLR
60.0000 mg | Freq: Four times a day (QID) | INTRAMUSCULAR | Status: DC
  Administered 2020-05-27 – 2020-06-02 (×24): 60 mg via INTRAVENOUS
  Filled 2020-05-27 (×23): qty 2

## 2020-05-27 MED ORDER — APIXABAN 2.5 MG PO TABS
2.5000 mg | ORAL_TABLET | Freq: Two times a day (BID) | ORAL | Status: DC
  Administered 2020-05-27 – 2020-06-06 (×21): 2.5 mg via ORAL
  Filled 2020-05-27 (×21): qty 1

## 2020-05-27 MED ORDER — FUROSEMIDE 10 MG/ML IJ SOLN
20.0000 mg | Freq: Once | INTRAMUSCULAR | Status: AC
  Administered 2020-05-27: 20 mg via INTRAVENOUS
  Filled 2020-05-27: qty 2

## 2020-05-27 NOTE — Progress Notes (Addendum)
PROGRESS NOTE  Peter Wood EPP:[id number] DOB: [birth year] DOA: 05/25/2020 PCP: Cassandria Anger, MD  HPI/Recap of past 13 hours: 85 year old male with past medical history of PE in 2020, hypertension, gastric adenocarcinoma with metastases to liver and bone that was diagnosed in January 2020 status post chemotherapy with good response slow progression who was referred from the oncology clinic for evaluation of persistent fever and dyspnea and presented to the emergency room on 9/14.  Symptoms that started apparently 3 to 4 weeks ago.  Chest x-ray that was done on 8/18 noted bilateral infiltrates however Covid PCR negative.  Despite negative Covid test and given his high risk, patient received monoclonal antibody treatment.  This was followed by a course of Zithromax without improvement.  Came in today after temperature of 101.5 and chest x-ray done noted persistent multifocal airspace opacities with a differential of atypical pneumonia versus lymphangitic spread of tumor.  Repeat Covid PCR negative.  Admitted to the hospital service.  Pulmonary consulted and patient underwent bronchoscopy with biopsy done at noon on 9/15.  Today, patient is doing okay. He thinks his breathing is doing a little bit better although overnight, needed to increase his oxygen to 12 L high flow. No other complaints.  Assessment/Plan: Active Problems:   Essential hypertension: Blood pressure stable.    Pulmonary embolism ([place]): Initially Eliquis held and bridged with heparin and is stopped prior to bronchoscopy. Transition back to Eliquis.    Metastasis from malignant tumor of stomach ([place]) to liver: Trending downward. Following transaminases.    Iron deficiency anemia due to chronic blood loss  Acute respiratory failure with hypoxia secondary to multifocal pneumonia versus lymphangitic spread of tumor: Awaiting biopsy results.  On IV Unasyn and vancomycin. Procalcitonin level elevated at eleven. In review of  literature, can be misleading with patients on active chemo    Hypoalbuminemia: Nutrition consult pending  Chronic diastolic heart failure: Noted on echocardiogram done in July. Grade 1. Looks to be euvolemic and given lower blood pressure normal BNP, and less likely to think that he has some underlying heart failure. We will try one dose of IV Lasix low-dose to see if he puts out any fluid.  Code Status: Full code, although likely patient will need a palliative care discussion  Family Communication: Left message for wife  Disposition Plan: Depending on findings of biopsies   Consultants:  Pulmonary  Discussed with infectious disease  Oncology  Procedures:  Status post bronchoscopy done 9/15 with biopsies taken, results pending  Antimicrobials:  IV Unasyn and vancomycin 9/15-present  DVT prophylaxis: Heparin   Objective: Vitals:   05/27/20 1230 05/27/20 1344  BP: 105/72 (!) 103/58  Pulse: 100 99  Resp:    Temp: 98.3 F (36.8 C) 98.1 F (36.7 C)  SpO2: 90% 92%    Intake/Output Summary (Last 24 hours) at 05/27/2020 1409 Last data filed at 05/27/2020 0453 Gross per 24 hour  Intake 411.73 ml  Output 325 ml  Net 86.73 ml   Filed Weights   05/25/20 1744 05/26/20 1234  Weight: 61.2 kg 61.2 kg   Body mass index is 23.17 kg/m.  Exam:   General: Alert and oriented x3, no acute distress  HEENT: Normocephalic atraumatic, mucous membranes are slightly dry  Cardiovascular: Regular rate and rhythm, S1-S2, borderline tachycardia  Respiratory: Fine crackles bilaterally, mild labored breathing  Abdomen: Soft, non-tender, non-distended, positive bowel sounds  Musculoskeletal: No clubbing or cyanosis or edema  Skin: No skin breaks, tears or lesions  Neuro:  No focal deficits  Psychiatry: Appropriate, no evidence of psychoses   Data Reviewed: CBC: Recent Labs  Lab 05/25/20 1552 05/26/20 0448 05/27/20 0304  WBC 10.0 11.4* 15.2*  NEUTROABS 7.8*  --   --    HGB 10.4* 9.7* 9.4*  HCT 31.3* 29.6* 28.0*  MCV 101.6* 104.2* 102.9*  PLT 318 305 659   Basic Metabolic Panel: Recent Labs  Lab 05/25/20 1552 05/26/20 0448 05/27/20 0304  NA 133* 135 134*  K 4.1 3.9 3.9  CL 96* 100 98  CO2 _0 GLUCOSE 94 94 106*  BUN _1 CREATININE 0.80 0.71 0.75  CALCIUM 9.1 8.3* 8.0*   GFR: Estimated Creatinine Clearance: 59.6 mL/min (by C-G formula based on SCr of 0.75 mg/dL). Liver Function Tests: Recent Labs  Lab 05/25/20 1552 05/26/20 0448 05/27/20 0304  AST 116* 79* 52*  ALT 86* 63* 46*  ALKPHOS 533* 386* 358*  BILITOT 1.1 0.9 1.5*  PROT 6.8 6.1* 5.6*  ALBUMIN 2.2* 2.2* 2.0*   No results for input(s): LIPASE, AMYLASE in the last 168 hours. No results for input(s): AMMONIA in the last 168 hours. Coagulation Profile: Recent Labs  Lab 05/26/20 0448 05/27/20 0304  INR 1.5* 1.6*   Cardiac Enzymes: No results for input(s): CKTOTAL, CKMB, CKMBINDEX, TROPONINI in the last 168 hours. BNP (last 3 results) No results for input(s): PROBNP in the last 8760 hours. HbA1C: No results for input(s): HGBA1C in the last 72 hours. CBG: No results for input(s): GLUCAP in the last 168 hours. Lipid Profile: Recent Labs    05/25/20 1805  TRIG 89   Thyroid Function Tests: Recent Labs    05/25/20 1554  TSH 0.538   Anemia Panel: Recent Labs    05/25/20 1805  FERRITIN 1,013*   Urine analysis:    Component Value Date/Time   COLORURINE YELLOW 02/18/2019 1056   APPEARANCEUR CLEAR 02/18/2019 1056   LABSPEC 1.018 02/18/2019 1056   PHURINE 6.0 02/18/2019 1056   GLUCOSEU NEGATIVE 02/18/2019 1056   GLUCOSEU NEGATIVE 07/20/2017 1056   HGBUR NEGATIVE 02/18/2019 1056   [place] 02/18/2019 1056   [place] 02/18/2019 1056   PROTEINUR 30 (A) 02/18/2019 1056   UROBILINOGEN 0.2 07/20/2017 1056   NITRITE NEGATIVE 02/18/2019 1056   LEUKOCYTESUR NEGATIVE 02/18/2019 1056   Sepsis  Labs: _2 (procalcitonin:4,lacticidven:4)  ) Recent Results (from the past 240 hour(s))  Culture, Blood     Status: None (Preliminary result)   Collection Time: 05/25/20  3:52 PM   Specimen: BLOOD  Result Value Ref Range Status   Specimen Description BLOOD LEFT ANTECUBITAL  Final   Special Requests   Final    BOTTLES DRAWN AEROBIC AND ANAEROBIC Blood Culture results may not be optimal due to an excessive volume of blood received in culture bottles   Culture   Final    NO GROWTH < 24 HOURS Performed at [facility], [place] [address], [place], [place] [address]    Report Status PENDING  Incomplete  Culture, Blood     Status: None (Preliminary result)   Collection Time: 05/25/20  4:48 PM   Specimen: BLOOD  Result Value Ref Range Status   Specimen Description BLOOD PORTA CATH  Final   Special Requests   Final    BOTTLES DRAWN AEROBIC AND ANAEROBIC Blood Culture adequate volume   Culture   Final    NO GROWTH < 24 HOURS Performed at [facility], [place] [address], [place], [place] [address]  Report Status PENDING  Incomplete  SARS Coronavirus 2 by RT PCR (hospital order, performed in [facility] hospital lab) Nasopharyngeal Nasopharyngeal Swab     Status: None   Collection Time: 05/25/20  6:05 PM   Specimen: Nasopharyngeal Swab  Result Value Ref Range Status   SARS Coronavirus 2 NEGATIVE NEGATIVE Final    Comment: (NOTE) SARS-CoV-2 target nucleic acids are NOT DETECTED.  The SARS-CoV-2 RNA is generally detectable in upper and lower respiratory specimens during the acute phase of infection. The lowest concentration of SARS-CoV-2 viral copies this assay can detect is 250 copies / mL. A negative result does not preclude SARS-CoV-2 infection and should not be used as the sole basis for treatment or other patient management decisions.  A negative result may occur with improper specimen collection / handling, submission of specimen other than nasopharyngeal swab,  presence of viral mutation(s) within the areas targeted by this assay, and inadequate number of viral copies (<250 copies / mL). A negative result must be combined with clinical observations, patient history, and epidemiological information.  Fact Sheet for Patients:   StrictlyIdeas.no  Fact Sheet for Healthcare Providers: BankingDealers.co.za  This test is not yet approved or  cleared by the [place] FDA and has been authorized for detection and/or diagnosis of SARS-CoV-2 by FDA under an Emergency Use Authorization (EUA).  This EUA will remain in effect (meaning this test can be used) for the duration of the COVID-19 declaration under Section 564(b)(1) of the Act, 21 U.S.C. section 360bbb-3(b)(1), unless the authorization is terminated or revoked sooner.  Performed at [facility], [place] [address] [place], [place] [address]   MRSA PCR Screening     Status: None   Collection Time: 05/25/20 11:45 PM   Specimen: Nasal Mucosa; Nasopharyngeal  Result Value Ref Range Status   MRSA by PCR NEGATIVE NEGATIVE Final    Comment:        The GeneXpert MRSA Assay (FDA approved for NASAL specimens only), is one component of a comprehensive MRSA colonization surveillance program. It is not intended to diagnose MRSA infection nor to guide or monitor treatment for MRSA infections. Performed at [facility], [place] [address], [place], [place] [address]   Culture, respiratory     Status: None (Preliminary result)   Collection Time: 05/26/20  1:37 PM   Specimen: Bronchoalveolar Lavage; Respiratory  Result Value Ref Range Status   Specimen Description   Final    BRONCHIAL ALVEOLAR LAVAGE RML Performed at [place] [address], [place], [place] [address]    Special Requests   Final    NONE Performed at [facility], [place] [address] [place], [place] [address]     Gram Stain   Final    MODERATE WBC PRESENT, PREDOMINANTLY MONONUCLEAR NO ORGANISMS SEEN Performed at [facility], [place] [address], [place], [place] [address]    Culture PENDING  Incomplete   Report Status PENDING  Incomplete  Culture, respiratory     Status: None (Preliminary result)   Collection Time: 05/26/20  1:54 PM   Specimen: Bronchoalveolar Lavage; Respiratory  Result Value Ref Range Status   Specimen Description   Final    BRONCHIAL ALVEOLAR LAVAGE RUL Performed at [place] [address], [place], [place] [address]    Special Requests   Final    NONE Performed at [facility], [place] [address], [place], [place] [address]    Gram Stain  Final    MODERATE WBC PRESENT, PREDOMINANTLY MONONUCLEAR NO ORGANISMS SEEN Performed at [facility], [place] [address] [place], [place] [address]    Culture PENDING  Incomplete   Report Status PENDING  Incomplete  Pneumocystis smear by DFA     Status: None   Collection Time: 05/26/20  1:54 PM   Specimen: Bronchial Alveolar Lavage; Respiratory  Result Value Ref Range Status   Specimen Source-[facility] BRONCHIAL ALVEOLAR LAVAGE  Final    Comment: RUL   Pneumocystis jiroveci Ag NEGATIVE  Final    Comment: Performed at [facility] Performed at [facility], [place] [address], [place], [place] [address]   Respiratory Panel by PCR     Status: None   Collection Time: 05/26/20  6:19 PM   Specimen: Nasopharyngeal Swab; Respiratory  Result Value Ref Range Status   Adenovirus NOT DETECTED NOT DETECTED Final   Coronavirus 229E NOT DETECTED NOT DETECTED Final    Comment: (NOTE) The Coronavirus on the Respiratory Panel, DOES NOT test for the novel  Coronavirus (2019 nCoV)    Coronavirus HKU1 NOT DETECTED NOT DETECTED Final   Coronavirus NL63 NOT DETECTED NOT DETECTED Final   Coronavirus OC43 NOT DETECTED NOT DETECTED Final   Metapneumovirus NOT DETECTED NOT  DETECTED Final   Rhinovirus / Enterovirus NOT DETECTED NOT DETECTED Final   Influenza A NOT DETECTED NOT DETECTED Final   Influenza B NOT DETECTED NOT DETECTED Final   Parainfluenza Virus 1 NOT DETECTED NOT DETECTED Final   Parainfluenza Virus 2 NOT DETECTED NOT DETECTED Final   Parainfluenza Virus 3 NOT DETECTED NOT DETECTED Final   Parainfluenza Virus 4 NOT DETECTED NOT DETECTED Final   Respiratory Syncytial Virus NOT DETECTED NOT DETECTED Final   Bordetella pertussis NOT DETECTED NOT DETECTED Final   Chlamydophila pneumoniae NOT DETECTED NOT DETECTED Final   Mycoplasma pneumoniae NOT DETECTED NOT DETECTED Final    Comment: Performed at [facility], [place]. [address] [place],  [address]      Studies: No results found.  Scheduled Meds: . amLODipine  5 mg Oral Daily  . apixaban  2.5 mg Oral BID  . benazepril  20 mg Oral Daily  . Chlorhexidine Gluconate Cloth  6 each Topical Daily  . docusate sodium  200 mg Oral Daily  . latanoprost  1 drop Both Eyes QHS  . polyethylene glycol  17 g Oral Daily    Continuous Infusions: . ampicillin-sulbactam (UNASYN) IV 3 g (05/27/20 0821)     LOS: 1 day     Annita Brod, MD [facility]   05/27/2020, 2:09 PM

## 2020-05-27 NOTE — Telephone Encounter (Signed)
No 9/14 los

## 2020-05-27 NOTE — Progress Notes (Signed)
Initial Nutrition Assessment  INTERVENTION:   -Boost Plus chocolate TID- Each supplement provides 360kcal and 14g protein.   -Multivitamin with minerals daily  NUTRITION DIAGNOSIS:   Increased nutrient needs related to cancer and cancer related treatments as evidenced by estimated needs.  GOAL:   Patient will meet greater than or equal to 90% of their needs  MONITOR:   PO intake, Supplement acceptance, Labs, Weight trends, I & O's  REASON FOR ASSESSMENT:   Consult Assessment of nutrition requirement/status  ASSESSMENT:   85 year old male with past medical history of PE in 2020, hypertension, gastric adenocarcinoma with metastases to liver and bone that was diagnosed in January 2020 status post chemotherapy with good response slow progression who was referred from the oncology clinic for evaluation of persistent fever and dyspnea and presented to the emergency room on 9/14.  9/15: s/p flex bronchoscopy  Patient unavailable at time of visit. Per chart review, pt has poor appetite with reports of difficulty chewing and swallowing. Per review of notes from when pt saw Malone RD, pt has drank Boost supplements in the past. Will order.   Per weight records, pt has lost 12 lbs since 5/4 (8% wt loss x 4.5 months, insignificant for time frame). Weights have been trending down.  Medications: colace, Miralax Labs reviewed:  Low Na  NUTRITION - FOCUSED PHYSICAL EXAM:  Unable to complete at this time  Diet Order:   Diet Order            Diet regular Room service appropriate? Yes; Fluid consistency: Thin  Diet effective now                 EDUCATION NEEDS:   No education needs have been identified at this time  Skin:  Skin Assessment: Reviewed RN Assessment  Last BM:  9/13  Height:   Ht Readings from Last 1 Encounters:  05/26/20 5\' 4"  (1.626 m)    Weight:   Wt Readings from Last 1 Encounters:  05/26/20 61.2 kg   BMI:  Body mass index is 23.17  kg/m.  Estimated Nutritional Needs:   Kcal:  1850-2050  Protein:  90-105g  Fluid:  2L/day   Clayton Bibles, MS, RD, LDN Inpatient Clinical Dietitian Contact information available via Amion

## 2020-05-27 NOTE — Progress Notes (Signed)
ANTICOAGULATION CONSULT NOTE - Initial Consult  Pharmacy Consult for transition from heparin to home apixaban Indication: hx pulmonary embolus  No Known Allergies  Patient Measurements: Height: 5\' 4"  (162.6 cm) Weight: 61.2 kg (134 lb 15.8 oz) IBW/kg (Calculated) : 59.2  Vital Signs: Temp: 98 F (36.7 C) (09/16 1020) Temp Source: Oral (09/16 1020) BP: 123/74 (09/16 1020) Pulse Rate: 101 (09/16 1020)  Labs: Recent Labs    05/25/20 1552 05/25/20 1805 05/25/20 2130 05/26/20 0109 05/26/20 0448 05/26/20 1145 05/27/20 0304  HGB 10.4*  --   --   --  9.7*  --  9.4*  HCT 31.3*  --   --   --  29.6*  --  28.0*  PLT 318  --   --   --  305  --  303  APTT  --   --   --  42*  --  60* 109*  LABPROT  --   --   --   --  17.8*  --  18.8*  INR  --   --   --   --  1.5*  --  1.6*  HEPARINUNFRC  --   --   --  0.76*  --  0.67 0.58  CREATININE 0.80  --   --   --  0.71  --  0.75  TROPONINIHS  --  19* 17  --   --   --   --     Estimated Creatinine Clearance: 59.6 mL/min (by C-G formula based on SCr of 0.75 mg/dL).   Medications:  - on Eliquis 2.5 mg BID PTA (last dose taken on 9/13 at 2100)  Assessment: Patient's an 85 y.o M with stomach cancer with liver mets on enhertu PTA (received dose on 8/18) and hx PE in Jan 2020 on Eliquis PTA, presented to the ED from [facility] on 9/14 with fever and SOB.  CXR on 9/14 showed findings with concern for PNA.  Chest CTA on 9/14 was negative for PE, but showed extensive bilateral infiltrates, small right pleural effusion, and small pericardial effusion. He was transitioned to heparin on admission for bronchoscopy procedure on 9/15. Consulted by CCM to transition back to home anticoagulation.  Today, 05/27/2020:  CBC: Hgb slightly low but stable; Plt WNL & stable  SCr 0.8, CrCl ~60 mL/min   Plan:   Discontinue heparin drip at time that apixaban given  Resume home dose of apixaban 2.5 mg PO BID  Order CBC every 72 hours  Pharmacy to sign off, please  re-consult if needed.   Lenis Noon, PharmD 05/27/2020,10:54 AM

## 2020-05-27 NOTE — Progress Notes (Signed)
Peter Wood   DOB:[birth year]   IO#:[id number]   LKG#:[id number]  Subjective:  English tolerated bronch w/o complications, mild sore throat; breathing still short when receiving bath this AM, no cough or phlegm; nurse in room, no family present    Objective: older African American man examined in bed Vitals:   05/27/20 0030 05/27/20 0518  BP: 101/68 117/80  Pulse: 83 94  Resp: (!) 24 20  Temp: 98.4 F (36.9 C) 98.2 F (36.8 C)  SpO2: 97% 92%    Body mass index is 23.17 kg/m.  Intake/Output Summary (Last 24 hours) at 05/27/2020 0733 Last data filed at 05/27/2020 0453 Gross per 24 hour  Intake 811.73 ml  Output 325 ml  Net 486.73 ml    .  Lungs no rales or wheezes--auscultated anterolaterally  Heart regular rate and rhythm    CBG (last 3)  No results for input(s): GLUCAP in the last 72 hours.   Labs:  Lab Results  Component Value Date   WBC 15.2 (H) 05/27/2020   HGB 9.4 (L) 05/27/2020   HCT 28.0 (L) 05/27/2020   MCV 102.9 (H) 05/27/2020   PLT 303 05/27/2020   NEUTROABS 7.8 (H) 05/25/2020    _0 @  Urine Studies No results for input(s): UHGB, CRYS in the last 72 hours.  Invalid input(s): UACOL, UAPR, USPG, UPH, UTP, UGL, UKET, UBIL, UNIT, UROB, ULEU, UEPI, UWBC, URBC, UBAC, CAST, [place], [place]  Basic Metabolic Panel: Recent Labs  Lab 05/25/20 1552 05/25/20 1552 05/26/20 0448 05/27/20 0304  NA 133*  --  135 134*  K 4.1   < > 3.9 3.9  CL 96*  --  100 98  CO2 29  --  26 26  GLUCOSE 94  --  94 106*  BUN 16  --  14 15  CREATININE 0.80  --  0.71 0.75  CALCIUM 9.1  --  8.3* 8.0*   < > = values in this interval not displayed.   GFR Estimated Creatinine Clearance: 59.6 mL/min (by C-G formula based on SCr of 0.75 mg/dL). Liver Function Tests: Recent Labs  Lab 05/25/20 1552 05/26/20 0448 05/27/20 0304  AST 116* 79* 52*  ALT 86* 63* 46*  ALKPHOS 533* 386* 358*  BILITOT 1.1 0.9 1.5*  PROT 6.8 6.1* 5.6*  ALBUMIN 2.2* 2.2* 2.0*   No results  for input(s): LIPASE, AMYLASE in the last 168 hours. No results for input(s): AMMONIA in the last 168 hours. Coagulation profile Recent Labs  Lab 05/26/20 0448 05/27/20 0304  INR 1.5* 1.6*    CBC: Recent Labs  Lab 05/25/20 1552 05/26/20 0448 05/27/20 0304  WBC 10.0 11.4* 15.2*  NEUTROABS 7.8*  --   --   HGB 10.4* 9.7* 9.4*  HCT 31.3* 29.6* 28.0*  MCV 101.6* 104.2* 102.9*  PLT 318 305 303   Cardiac Enzymes: No results for input(s): CKTOTAL, CKMB, CKMBINDEX, TROPONINI in the last 168 hours. BNP: Invalid input(s): POCBNP CBG: No results for input(s): GLUCAP in the last 168 hours. D-Dimer Recent Labs    05/25/20 1805  DDIMER >20.00*   Hgb A1c No results for input(s): HGBA1C in the last 72 hours. Lipid Profile Recent Labs    05/25/20 1805  TRIG 89   Thyroid function studies Recent Labs    05/25/20 1554  TSH 0.538   Anemia work up Recent Labs    05/25/20 1805  FERRITIN 1,013*   Microbiology Recent Results (from the past 240 hour(s))  Culture, Blood     Status: None (  Preliminary result)   Collection Time: 05/25/20  3:52 PM   Specimen: BLOOD  Result Value Ref Range Status   Specimen Description BLOOD LEFT ANTECUBITAL  Final   Special Requests   Final    BOTTLES DRAWN AEROBIC AND ANAEROBIC Blood Culture results may not be optimal due to an excessive volume of blood received in culture bottles   Culture   Final    NO GROWTH < 24 HOURS Performed at [facility] [address], [place], [place] [address]    Report Status PENDING  Incomplete  Culture, Blood     Status: None (Preliminary result)   Collection Time: 05/25/20  4:48 PM   Specimen: BLOOD  Result Value Ref Range Status   Specimen Description BLOOD PORTA CATH  Final   Special Requests   Final    BOTTLES DRAWN AEROBIC AND ANAEROBIC Blood Culture adequate volume   Culture   Final    NO GROWTH < 24 HOURS Performed at [facility], [place] [address] [place], [place] [address]     Report Status PENDING  Incomplete  SARS Coronavirus 2 by RT PCR (hospital order, performed in [facility] hospital lab) Nasopharyngeal Nasopharyngeal Swab     Status: None   Collection Time: 05/25/20  6:05 PM   Specimen: Nasopharyngeal Swab  Result Value Ref Range Status   SARS Coronavirus 2 NEGATIVE NEGATIVE Final    Comment: (NOTE) SARS-CoV-2 target nucleic acids are NOT DETECTED.  The SARS-CoV-2 RNA is generally detectable in upper and lower respiratory specimens during the acute phase of infection. The lowest concentration of SARS-CoV-2 viral copies this assay can detect is 250 copies / mL. A negative result does not preclude SARS-CoV-2 infection and should not be used as the sole basis for treatment or other patient management decisions.  A negative result may occur with improper specimen collection / handling, submission of specimen other than nasopharyngeal swab, presence of viral mutation(s) within the areas targeted by this assay, and inadequate number of viral copies (<250 copies / mL). A negative result must be combined with clinical observations, patient history, and epidemiological information.  Fact Sheet for Patients:   StrictlyIdeas.no  Fact Sheet for Healthcare Providers: BankingDealers.co.za  This test is not yet approved or  cleared by the [place] FDA and has been authorized for detection and/or diagnosis of SARS-CoV-2 by FDA under an Emergency Use Authorization (EUA).  This EUA will remain in effect (meaning this test can be used) for the duration of the COVID-19 declaration under Section 564(b)(1) of the Act, 21 U.S.C. section 360bbb-3(b)(1), unless the authorization is terminated or revoked sooner.  Performed at [facility], [place] [address] [place], [place] [address]   MRSA PCR Screening     Status: None   Collection Time: 05/25/20 11:45 PM   Specimen: Nasal Mucosa; Nasopharyngeal   Result Value Ref Range Status   MRSA by PCR NEGATIVE NEGATIVE Final    Comment:        The GeneXpert MRSA Assay (FDA approved for NASAL specimens only), is one component of a comprehensive MRSA colonization surveillance program. It is not intended to diagnose MRSA infection nor to guide or monitor treatment for MRSA infections. Performed at [facility], [place] [address], [place], [place] [address]   Culture, respiratory     Status: None (Preliminary result)   Collection Time: 05/26/20  1:37 PM   Specimen: Bronchoalveolar Lavage; Respiratory  Result Value Ref Range Status  Specimen Description   Final    BRONCHIAL ALVEOLAR LAVAGE RML Performed at [facility] [address], [place], [place] [address]    Special Requests   Final    NONE Performed at [facility], [place] [address] [place], [place] [address]    Gram Stain   Final    MODERATE WBC PRESENT, PREDOMINANTLY MONONUCLEAR NO ORGANISMS SEEN Performed at [facility], [place] [address] [place], [place] [address]    Culture PENDING  Incomplete   Report Status PENDING  Incomplete  Culture, respiratory     Status: None (Preliminary result)   Collection Time: 05/26/20  1:54 PM   Specimen: Bronchoalveolar Lavage; Respiratory  Result Value Ref Range Status   Specimen Description   Final    BRONCHIAL ALVEOLAR LAVAGE RUL Performed at [facility] [address], [place], [place] [address]    Special Requests   Final    NONE Performed at [facility], [place] [address], [place], [place] [address]    Gram Stain   Final    MODERATE WBC PRESENT, PREDOMINANTLY MONONUCLEAR NO ORGANISMS SEEN Performed at [facility], [place] [address] [place], [place] [address]    Culture PENDING  Incomplete   Report Status PENDING  Incomplete  Respiratory Panel by PCR     Status: None   Collection Time: 05/26/20  6:19 PM   Specimen:  Nasopharyngeal Swab; Respiratory  Result Value Ref Range Status   Adenovirus NOT DETECTED NOT DETECTED Final   Coronavirus 229E NOT DETECTED NOT DETECTED Final    Comment: (NOTE) The Coronavirus on the Respiratory Panel, DOES NOT test for the novel  Coronavirus (2019 nCoV)    Coronavirus HKU1 NOT DETECTED NOT DETECTED Final   Coronavirus NL63 NOT DETECTED NOT DETECTED Final   Coronavirus OC43 NOT DETECTED NOT DETECTED Final   Metapneumovirus NOT DETECTED NOT DETECTED Final   Rhinovirus / Enterovirus NOT DETECTED NOT DETECTED Final   Influenza A NOT DETECTED NOT DETECTED Final   Influenza B NOT DETECTED NOT DETECTED Final   Parainfluenza Virus 1 NOT DETECTED NOT DETECTED Final   Parainfluenza Virus 2 NOT DETECTED NOT DETECTED Final   Parainfluenza Virus 3 NOT DETECTED NOT DETECTED Final   Parainfluenza Virus 4 NOT DETECTED NOT DETECTED Final   Respiratory Syncytial Virus NOT DETECTED NOT DETECTED Final   Bordetella pertussis NOT DETECTED NOT DETECTED Final   Chlamydophila pneumoniae NOT DETECTED NOT DETECTED Final   Mycoplasma pneumoniae NOT DETECTED NOT DETECTED Final    Comment: Performed at [facility], [place]. [address] [place],  [address]      Studies:  DG Chest 2 View  Result Date: 05/25/2020 CLINICAL DATA:  Cough and fever.  Reported known prostate carcinoma EXAM: CHEST - 2 VIEW COMPARISON:  April 28, 2020 FINDINGS: There is widespread multifocal airspace opacity throughout the lungs bilaterally, most notably in the right upper lobe. There is an equivocal right pleural effusion. Heart is upper normal in size with pulmonary vascularity normal. Port-A-Cath tip is in the superior vena cava. No appreciable adenopathy. No blastic or lytic bone lesions are appreciable by radiography. IMPRESSION: Widespread airspace opacity bilaterally, more severe on the right than on the left. Suspect atypical organism pneumonia given this overall appearance. Lymphangitic spread of tumor  conceivably could present in this manner but is felt to be less likely given this overall appearance. No well-defined nodular opacities noted within the lungs. Port-A-Cath tip in  superior vena cava. No adenopathy evident by radiography. Heart upper normal in size. These results will be called to the ordering clinician or representative by the Radiologist Assistant, and communication documented in the PACS or [employer]. Electronically Signed   By: Lowella Grip III M.D.   On: 05/25/2020 15:29   CT Angio Chest PE W and/or Wo Contrast  Result Date: 05/25/2020 CLINICAL DATA:  Dyspnea, fevers, elevated D-dimer EXAM: CT ANGIOGRAPHY CHEST WITH CONTRAST TECHNIQUE: Multidetector CT imaging of the chest was performed using the standard protocol during bolus administration of intravenous contrast. Multiplanar CT image reconstructions and MIPs were obtained to evaluate the vascular anatomy. CONTRAST:  24m OMNIPAQUE IOHEXOL 350 MG/ML SOLN COMPARISON:  None. FINDINGS: Cardiovascular: Right internal jugular chest port tip noted within the right atrium. Global cardiac size within normal limits. No significant coronary artery calcification. Small simple appearing pericardial effusion without CT evidence of cardiac tamponade. Central pulmonary arterial enlargement in keeping with changes of pulmonary artery hypertension. There is excellent opacification of the central pulmonary arterial tree without evidence of filling defect to suggest acute pulmonary embolism. The thoracic aorta is unremarkable. Mediastinum/Nodes: No pathologic thoracic adenopathy. Lungs/Pleura: There are extensive bilateral asymmetric airspace infiltrates demonstrating an upper and mid lung zone predominance most in keeping with changes of atypical infection in the acute setting. Small right pleural effusion is present. No pneumothorax. The central airways are widely patent. Upper Abdomen: Low-attenuation lesion is seen within the left hepatic  lobe, poorly characterized on this examination. Musculoskeletal: No chest wall abnormality. No acute or significant osseous findings. Review of the MIP images confirms the above findings. IMPRESSION: 1. No evidence of acute pulmonary embolism. 2. Extensive bilateral asymmetric airspace infiltrates demonstrating an upper and mid lung zone predominance most in keeping with changes of atypical infection in the acute setting. Serologic correlation for COVID-19 pneumonia may be helpful for further evaluation. 3. Small right pleural effusion. 4. Small simple appearing pericardial effusion without CT evidence of cardiac tamponade. 5. Central pulmonary arterial enlargement in keeping with changes of pulmonary artery hypertension. Electronically Signed   By: AFidela SalisburyMD   On: 05/25/2020 22:05    Assessment: 85[place]. [place] post gastric biopsy 09/20/2018 showing adenocarcinoma, with a CA 19-9 greater than 65,000; staging studies showing an apparent mass adjacent to the head of the pancreas, innumerable liver lesions, upper abdominal mesenteric and peripancreatic lymphadenopathy, and an isolated sclerotic density in the left iliac bone             (a) genomics requested on gastric biopsy showed the tumor to be HER-2 amplified at 3+, PD-L1 positive, and T p53 mutated.  MSI is stable, mismatch repair status is proficient and the tumor mutational burden is intermediate.  (1) chest CT scan 09/19/2018 shows right lower lobe pulmonary embolism             (a) on intravenous heparin started 09/19/2018, transitioned to apixaban 10/01/2018  (2) genetics testing 10/04/2018: negative             (a) family history of prostate and early breast cancer; patient has a history of prostate cancer  (3) started gemcitabine/Abraxane 10/09/2018, repeated days 1, 8 and 15 of each 28-day cycle             (a) stopped after 1 cycle (3 doses) with reassessment of diagnosis based on CARIS results, noted  above  (4) started oxaliplatin, capecitabine and trastuzumab 11/06/2018             (  a) baseline echocardiogram 09/20/2018 shows an ejection fraction in the 55-60% range             (b) oxaliplatin and trastuzumab every 21 days started 11/06/2018             (c) capecitabine dose 1000 mg twice daily for 14 days of every 21-day cycle             (d) CT of the abdomen and pelvis 02/11/2019 shows marked improvement in his liver lesions and resolution of periportal adenopathy             (e) tumor markers also show significant response,             (f) echocardiogram 02/12/2019 shows an ejection fraction in the 60-65% range             (g) CT abd/pelvis show stable/ improved disease, tumor markers nearly normal             (h) oxaliplatin stopped aftwer 04/30/2019 dose             (i) capecitabine stopped after September cycle              (5) trastuzumab maintenance started 05/20/2019, last dose 07/22/2019, with progression  (6) disease progression documented by lab work and CT scan 07/16/2019             (a) resumed FOLFOX 07/29/2019, continuing trastuzumab (every 14 days).             (b) restaging studies after 3 cycles showed evidence of progression (tumor markers equivocal)             (c) FOLFOX discontinued 08/26/2019  (7) started T-DM1 09/30/2019, repeat every 21 days             (a) pembrolizumab added 12/02/2019             (b) echocardiogram 08/25/2019 showed an ejection fraction in the 60-65% range             (c) repeat CT of the abdomen 12/11/2019 shows growth in 2 liver lesions  (8) started [facility] 12/23/2019 at 5.4 mg/kg.             (a) dose increased to 6.1 mg/kg with the third dose and two 6.4 (target dose) 03/17/2020                  (b) Changed to every 4 weeks after receiving 04/28/2020 dose (which was his most recent)  (9) iron deficiency anemia:             (a) received Feraheme 04/22 /2021 and 01/08/2020      (10) pneumonia/ pneumonitis?  (a) patchy  infiltrates on CXR 04/28/2020   (i) COVID-19 negative   (ii) received antibodies as outpatient   (iii) s/p Z=pack  (b) worsening cough, SOB and fever   (i) CT angio 05/25/2020 no PE, worsening bilateral infiltrates c/w atypical infection   (ii) vancomycin, unasyn started 05/25/2020   Plan:  Day 2 antibiotics; cultures from 09/14 negative to date; preliminary results from [facility] show negative viral panel, mononuclear cells c/w pneumonitis which is the working dx; cytology and additional studies in process  Would defer to PUL as to when it may be safe to start steroids. Wrote for bowel regimen.  Will follow with you     Chauncey Cruel, MD 05/27/2020  7:33 AM Medical Oncology and Hematology [facility] [place],  [place] [address] Tel. [phone]    Fax. [phone]

## 2020-05-27 NOTE — Progress Notes (Signed)
ANTICOAGULATION CONSULT NOTE - Follow Up Consult  Pharmacy Consult for heparin Indication: hx pulmonary embolus (home Eliquis on hold)  No Known Allergies  Patient Measurements: Height: 5\' 4"  (162.6 cm) Weight: 61.2 kg (134 lb 15.8 oz) IBW/kg (Calculated) : 59.2 Heparin Dosing Weight: 61 kg  Vital Signs: Temp: 98.4 F (36.9 C) (09/16 0030) Temp Source: Oral (09/16 0030) BP: 101/68 (09/16 0030) Pulse Rate: 83 (09/16 0030)  Labs: Recent Labs    05/25/20 1552 05/25/20 1805 05/25/20 2130 05/26/20 0109 05/26/20 0448 05/26/20 1145 05/27/20 0304  HGB 10.4*  --   --   --  9.7*  --  9.4*  HCT 31.3*  --   --   --  29.6*  --  28.0*  PLT 318  --   --   --  305  --  303  APTT  --   --   --  42*  --  60* 109*  LABPROT  --   --   --   --  17.8*  --  18.8*  INR  --   --   --   --  1.5*  --  1.6*  HEPARINUNFRC  --   --   --  0.76*  --  0.67 0.58  CREATININE 0.80  --   --   --  0.71  --  0.75  TROPONINIHS  --  19* 17  --   --   --   --     Estimated Creatinine Clearance: 59.6 mL/min (by C-G formula based on SCr of 0.75 mg/dL).   Medications:  - on Eliquis 2.5 mg BID PTA (last dose taken on 9/13 at 2100)  Assessment: Patient's an 85 y.o M with stomach cancer with liver mets on enhertu PTA (received dose on 8/18) and hx PE in Jan 2020 on Eliquis PTA, presented [facility] the ED [facility] on 9/14 with fever and SOB.  CXR on 9/14 showed findings with concern for PNA.  Chest CTA on 9/14 was negative for PE, but showed extensive bilateral infiltrates, small right pleural effusion, and small pericardial effusion. He was transitioned to heparin on admission for bronchoscopy procedure on 9/15. Spoke to Dr. Erin Fulling, he said ok to resume heparin drip back s/p bronch at 5p on 9/15.  - 9/15: heparin drip turned off at 11a for bronch procedure   Today, 05/27/2020: - heparin level 0.58, aPTT 109 sec --> since the two levels are not correlating 2nd to recent use of Eliquis, will adjust heparin rate based  on aPTT for now - hgb down slightly to 9.4, plts ok - no bleeding documented  Goal of Therapy:  Heparin level 0.3-0.7 units/ml aPTT 66-102 seconds Monitor platelets by anticoagulation protocol: Yes   Plan:  -decrease heparin drip to 1000 units/hr - check 8 hr heparin level and aPTT - monitor for s/s bleeding  Emaan Gary, Toribio Harbour, PharmD 05/27/2020,5:11 AM

## 2020-05-27 NOTE — Consult Note (Signed)
NAME:  Peter Wood, MRN:  [id number], DOB:  08-01-1935, LOS: 1 ADMISSION DATE:  05/25/2020, CONSULTATION DATE:  05/26/20 REFERRING MD: Dr. Maryland Pink, CHIEF COMPLAINT:  SOB, Fever   Brief History   85 y/o M admitted 9/14 with reports of shortness of breath and fever.  He has a history of gastric adenocarcinoma with metastatic disease to liver and bone (initial diagnosis 09/2018) s/p chemotherapy with slow progression on Enhertu (last dose 04/28/20).   He had a CXR on 8/18 that demonstrated bilateral infiltrates.  COVID testing was negative at that time.  However, he was treated with monoclonal antibody (casirivimab / imdevimab) on 8/19 followed by a course of azithromycin without significant improvement.    PCCM consulted for evaluation.    Past Medical History  HTN PE - on eliquis  Prostate Cancer - s/p radiation treatment  Gastric adenocarcinoma with metastatic disease to liver and bone, initial dx 09/2018  Significant Hospital Events   9/14 Admit 9/15 PCCM consulted for pulmonary evaluation   Consults:    Procedures:    Significant Diagnostic Tests:  CTA Chest 9/15 >> negative for PE, extensive bilateral asymmetric airspace opacities, small right pleural effusion, small simple appearing pericardial effusion without CT evidence of tamponade, central pulmonary arterial enlargement FOB 9/15 >> clear airways, minimal secretions, scant bloody tinged secretions in LLL  Micro Data:  COVID 9/14 >> negative  RVP 9/15 >> negative  BAL 9/15 >>  AFB 9/15 >>  PCP by DFA 9/15 >> negative   Antimicrobials:  Unasyn 9/15 >>   Interim history/subjective:  Afebrile  Cultures negative to date  Pt on 11L O2 Denies acute complaints, feels better than he did yesterday    Objective   Blood pressure (!) 141/93, pulse (!) 109, temperature 98.2 F (36.8 C), temperature source Oral, resp. rate 20, height _0  (1.626 m), weight 61.2 kg, SpO2 92 %.        Intake/Output Summary (Last 24  hours) at 05/27/2020 1029 Last data filed at 05/27/2020 0453 Gross per 24 hour  Intake 711.73 ml  Output 325 ml  Net 386.73 ml   Filed Weights   05/25/20 1744 05/26/20 1234  Weight: 61.2 kg 61.2 kg    Examination: General: pleasant elderly male lying in bed in NAD HEENT: MM pink/moist, anicteric, [place] O2 Neuro: AAOx4, speech clear, MAE  CV: s1s2 RRR, no m/r/g PULM: non-labored, bibasilar crackles GI: soft, bsx4 active  Extremities: warm/dry, BLE 1-2+ pitting edema  Skin: no rashes or lesions   Resolved Hospital Problem list      Assessment & Plan:    Acute Hypoxic Respiratory Failure in setting of Diffuse Bilateral Infiltrates  Fever  Hx Pulmonary Embolism on Eliquis  Recent hx of negative COVID testing, treated with monoclonal antibodies & azithromycin.  Differential dx includes viral / bacterial infection, component pulmonary edema with noted central pulmonary arterial enlargement, BOOP/COP, pneumonitis / ILD associated with trastuzumab and lymphangitic spread of malignancy. CT angio negative for PE and he is on chronic anticoagulation for prior PE. Minimal secretions on bronchoscopy. Suspect most likely dx is pneumonitis vs malignant spread.  -transition back to home anticoagulation  -wean O2 for sats >90% -follow BAL results  -continue empiric unasyn, note PCT elevated but may not be accurate in presence of malignancy -follow fever curve / WBC trend  -monitor sed rate trend -intermittent CXR  -PRN tessalon for cough  -consider high dose steroids, will discuss timing of initiation with MD  Best practice:  Diet:  As tolerated, per primary  Pain/Anxiety/Delirium protocol (if indicated): n/a  VAP protocol (if indicated): n/a  DVT prophylaxis: heparin gtt  GI prophylaxis: per primary  Glucose control: per primary  Mobility: as tolerated  Code Status: Full Code. Family Communication: Patient updated on plan of care 9/16 Disposition: Per primary   Labs   CBC: Recent  Labs  Lab 05/25/20 1552 05/26/20 0448 05/27/20 0304  WBC 10.0 11.4* 15.2*  NEUTROABS 7.8*  --   --   HGB 10.4* 9.7* 9.4*  HCT 31.3* 29.6* 28.0*  MCV 101.6* 104.2* 102.9*  PLT 318 305 709    Basic Metabolic Panel: Recent Labs  Lab 05/25/20 1552 05/26/20 0448 05/27/20 0304  NA 133* 135 134*  K 4.1 3.9 3.9  CL 96* 100 98  CO2 _0 GLUCOSE 94 94 106*  BUN _1 CREATININE 0.80 0.71 0.75  CALCIUM 9.1 8.3* 8.0*   GFR: Estimated Creatinine Clearance: 59.6 mL/min (by C-G formula based on SCr of 0.75 mg/dL). Recent Labs  Lab 05/25/20 1552 05/25/20 1805 05/25/20 2130 05/26/20 0448 05/27/20 0304 05/27/20 0305  PROCALCITON  --  1.96  --   --   --  11.09  WBC 10.0  --   --  11.4* 15.2*  --   LATICACIDVEN  --  1.4 1.3  --   --   --     Liver Function Tests: Recent Labs  Lab 05/25/20 1552 05/26/20 0448 05/27/20 0304  AST 116* 79* 52*  ALT 86* 63* 46*  ALKPHOS 533* 386* 358*  BILITOT 1.1 0.9 1.5*  PROT 6.8 6.1* 5.6*  ALBUMIN 2.2* 2.2* 2.0*   No results for input(s): LIPASE, AMYLASE in the last 168 hours. No results for input(s): AMMONIA in the last 168 hours.  ABG No results found for: PHART, PCO2ART, PO2ART, HCO3, TCO2, ACIDBASEDEF, O2SAT   Coagulation Profile: Recent Labs  Lab 05/26/20 0448 05/27/20 0304  INR 1.5* 1.6*    Cardiac Enzymes: No results for input(s): CKTOTAL, CKMB, CKMBINDEX, TROPONINI in the last 168 hours.  HbA1C: No results found for: HGBA1C  CBG: No results for input(s): GLUCAP in the last 168 hours.     Critical care time: n/a     Noe Gens, MSN, NP-C [facility] 05/27/2020, 10:29 AM   Please see [email or web address] for pager details.

## 2020-05-28 DIAGNOSIS — I5032 Chronic diastolic (congestive) heart failure: Secondary | ICD-10-CM

## 2020-05-28 DIAGNOSIS — D500 Iron deficiency anemia secondary to blood loss (chronic): Secondary | ICD-10-CM

## 2020-05-28 DIAGNOSIS — D638 Anemia in other chronic diseases classified elsewhere: Secondary | ICD-10-CM

## 2020-05-28 LAB — CBC
HCT: 27.8 % — ABNORMAL LOW (ref 39.0–52.0)
Hemoglobin: 9.1 g/dL — ABNORMAL LOW (ref 13.0–17.0)
MCH: 33.8 pg (ref 26.0–34.0)
MCHC: 32.7 g/dL (ref 30.0–36.0)
MCV: 103.3 fL — ABNORMAL HIGH (ref 80.0–100.0)
Platelets: 329 10*3/uL (ref 150–400)
RBC: 2.69 MIL/uL — ABNORMAL LOW (ref 4.22–5.81)
RDW: 15.4 % (ref 11.5–15.5)
WBC: 7.5 10*3/uL (ref 4.0–10.5)
nRBC: 0 % (ref 0.0–0.2)

## 2020-05-28 LAB — COMPREHENSIVE METABOLIC PANEL
ALT: 43 U/L (ref 0–44)
AST: 54 U/L — ABNORMAL HIGH (ref 15–41)
Albumin: 1.8 g/dL — ABNORMAL LOW (ref 3.5–5.0)
Alkaline Phosphatase: 374 U/L — ABNORMAL HIGH (ref 38–126)
Anion gap: 11 (ref 5–15)
BUN: 24 mg/dL — ABNORMAL HIGH (ref 8–23)
CO2: 30 mmol/L (ref 22–32)
Calcium: 8.7 mg/dL — ABNORMAL LOW (ref 8.9–10.3)
Chloride: 101 mmol/L (ref 98–111)
Creatinine, Ser: 0.72 mg/dL (ref 0.61–1.24)
GFR calc Af Amer: >60 mL/min (ref >60)
GFR calc non Af Amer: >60 mL/min (ref >60)
Glucose, Bld: 153 mg/dL — ABNORMAL HIGH (ref 70–99)
Potassium: 3.8 mmol/L (ref 3.5–5.1)
Sodium: 142 mmol/L (ref 135–145)
Total Bilirubin: 1.2 mg/dL (ref 0.3–1.2)
Total Protein: 5.9 g/dL — ABNORMAL LOW (ref 6.5–8.1)

## 2020-05-28 LAB — CYTOLOGY - NON PAP

## 2020-05-28 LAB — PROTIME-INR
INR: 1.8 — ABNORMAL HIGH (ref 0.8–1.2)
Prothrombin Time: 20.3 seconds — ABNORMAL HIGH (ref 11.4–15.2)

## 2020-05-28 MED ORDER — METOPROLOL TARTRATE 12.5 MG HALF TABLET
12.5000 mg | ORAL_TABLET | Freq: Two times a day (BID) | ORAL | Status: DC
  Administered 2020-05-29 – 2020-06-06 (×17): 12.5 mg via ORAL
  Filled 2020-05-28 (×17): qty 1

## 2020-05-28 MED ORDER — BENAZEPRIL HCL 10 MG PO TABS: 10.0000 mg | ORAL_TABLET | Freq: Every day | ORAL | Status: DC

## 2020-05-28 NOTE — Care Management Important Message (Signed)
Important Message  Patient Details IM Letter given to the Patient  Name: Peter Wood MRN: [id number] Date of Birth: 02/21/1935   Medicare Important Message Given:  Yes     Kerin Salen 05/28/2020, 10:37 AM

## 2020-05-28 NOTE — Progress Notes (Signed)
NAME:  Peter Wood, MRN:  [id number], DOB:  [birth year], LOS: 2 ADMISSION DATE:  05/25/2020, CONSULTATION DATE:  05/26/20 REFERRING MD: Dr. [place] Wood, CHIEF COMPLAINT:  SOB, Fever   Brief History   85 y/o M admitted 9/14 with reports of shortness of breath and fever.  He has a history of gastric adenocarcinoma with metastatic disease to liver and bone (initial diagnosis 09/2018) s/p chemotherapy with slow progression on Enhertu (last dose 04/28/20).   He had a CXR on 8/18 that demonstrated bilateral infiltrates.  COVID testing was negative at that time.  However, he was treated with monoclonal antibody (casirivimab / imdevimab) on 8/19 followed by a course of azithromycin without significant improvement.    PCCM consulted for evaluation.    Past Medical History  HTN PE - on eliquis  Prostate Cancer - s/p radiation treatment  Gastric adenocarcinoma with metastatic disease to liver and bone, initial dx 09/2018  Significant Hospital Events   9/14 Admit 9/15 PCCM consulted for pulmonary evaluation  9/16 Steroids initiated   Consults:    Procedures:    Significant Diagnostic Tests:  CTA Chest 9/15 >> negative for PE, extensive bilateral asymmetric airspace opacities, small right pleural effusion, small simple appearing pericardial effusion without CT evidence of tamponade, central pulmonary arterial enlargement FOB 9/15 >> clear airways, minimal secretions, scant bloody tinged secretions in LLL  Micro Data:  COVID 9/14 >> negative  RVP 9/15 >> negative  BAL 9/15 >>  AFB 9/15 >>  PCP by DFA 9/15 >> negative   Antimicrobials:  Unasyn 9/15 >>   Interim history/subjective:  Afebrile  Pt reports breathing is improved.   Working with SLP for swallow evaluation  Steroids initiated 9/16   Objective   Blood pressure 128/81, pulse 83, temperature 97.8 F (36.6 C), temperature source Oral, resp. rate 18, height _0  (1.626 m), weight 61.2 kg, SpO2 (!) 89 %.        Intake/Output  Summary (Last 24 hours) at 05/28/2020 6440 Last data filed at 05/28/2020 3474 Gross per 24 hour  Intake 495.04 ml  Output 300 ml  Net 195.04 ml   Filed Weights   05/25/20 1744 05/26/20 1234  Weight: 61.2 kg 61.2 kg    Examination: General: thin elderly male lying in bed in NAD, pleasant  HEENT: MM Wood/moist, anicteric, [place] O2 Neuro: AAOx4, speech clear, MAE  CV: s1s2 regular, no m/r/g PULM: non-labored, bibasilar crackles, faint wheeze on left lower  GI: soft, bsx4 active  Extremities: warm/dry, 1+ BLE edema  Skin: no rashes or lesions  Resolved Hospital Problem list      Assessment & Plan:    Acute Hypoxic Respiratory Failure in setting of Diffuse Bilateral Infiltrates  Fever  Hx Pulmonary Embolism on Eliquis  Recent hx of negative COVID testing, treated with monoclonal antibodies & azithromycin.  Differential dx includes viral / bacterial infection, component pulmonary edema with noted central pulmonary arterial enlargement, BOOP/COP, pneumonitis / ILD associated with trastuzumab and lymphangitic spread of malignancy. CT angio negative for PE and he is on chronic anticoagulation for prior PE. Minimal secretions on bronchoscopy. Suspect most likely dx is pneumonitis vs malignant spread.  -continue home Eliquis   -wean O2 for sats >90% -follow up BAL results  -continue empiric unasyn  -follow intermittent CXR  -PRN tessalon for cough  -continue steroids,solumedrol 57m Q6. Wean based on O2 response  -mobilize / pulmonary hygiene -follow up SLP evaluation  Best practice:  Diet: As tolerated, per primary  Pain/Anxiety/Delirium protocol (  if indicated): n/a  VAP protocol (if indicated): n/a  DVT prophylaxis: heparin gtt  GI prophylaxis: per primary  Glucose control: per primary  Mobility: as tolerated  Code Status: Full Code. Family Communication: Patient updated on plan of care 9/17   Disposition: Per primary   Labs   CBC: Recent Labs  Lab 05/25/20 1552  05/26/20 0448 05/27/20 0304 05/28/20 0420  WBC 10.0 11.4* 15.2* 7.5  NEUTROABS 7.8*  --   --   --   HGB 10.4* 9.7* 9.4* 9.1*  HCT 31.3* 29.6* 28.0* 27.8*  MCV 101.6* 104.2* 102.9* 103.3*  PLT 318 305 303 459    Basic Metabolic Panel: Recent Labs  Lab 05/25/20 1552 05/26/20 0448 05/27/20 0304 05/28/20 0420  NA 133* 135 134* 142  K 4.1 3.9 3.9 3.8  CL 96* 100 98 101  CO2 _0 GLUCOSE 94 94 106* 153*  BUN _1 24*  CREATININE 0.80 0.71 0.75 0.72  CALCIUM 9.1 8.3* 8.0* 8.7*   GFR: Estimated Creatinine Clearance: 59.6 mL/min (by C-G formula based on SCr of 0.72 mg/dL). Recent Labs  Lab 05/25/20 1552 05/25/20 1805 05/25/20 2130 05/26/20 0448 05/27/20 0304 05/27/20 0305 05/28/20 0420  PROCALCITON  --  1.96  --   --   --  11.09  --   WBC 10.0  --   --  11.4* 15.2*  --  7.5  LATICACIDVEN  --  1.4 1.3  --   --   --   --     Liver Function Tests: Recent Labs  Lab 05/25/20 1552 05/26/20 0448 05/27/20 0304 05/28/20 0420  AST 116* 79* 52* 54*  ALT 86* 63* 46* 43  ALKPHOS 533* 386* 358* 374*  BILITOT 1.1 0.9 1.5* 1.2  PROT 6.8 6.1* 5.6* 5.9*  ALBUMIN 2.2* 2.2* 2.0* 1.8*   No results for input(s): LIPASE, AMYLASE in the last 168 hours. No results for input(s): AMMONIA in the last 168 hours.  ABG No results found for: PHART, PCO2ART, PO2ART, HCO3, TCO2, ACIDBASEDEF, O2SAT   Coagulation Profile: Recent Labs  Lab 05/26/20 0448 05/27/20 0304 05/28/20 0420  INR 1.5* 1.6* 1.8*    Cardiac Enzymes: No results for input(s): CKTOTAL, CKMB, CKMBINDEX, TROPONINI in the last 168 hours.  HbA1C: No results found for: HGBA1C  CBG: No results for input(s): GLUCAP in the last 168 hours.     Critical care time: n/a     Peter Gens, MSN, NP-C [facility] 05/28/2020, 8:33 AM   Please see [email or web address] for pager details.

## 2020-05-28 NOTE — Anesthesia Postprocedure Evaluation (Signed)
Anesthesia Post Note  Patient: Peter Wood  Procedure(s) Performed: VIDEO BRONCHOSCOPY WITHOUT FLUORO (N/A ) BRONCHIAL WASHINGS     Patient location during evaluation: PACU Anesthesia Type: General Level of consciousness: awake and alert Pain management: pain level controlled Vital Signs Assessment: post-procedure vital signs reviewed and stable Respiratory status: spontaneous breathing, respiratory function stable and patient connected to face mask oxygen Cardiovascular status: blood pressure returned to baseline and stable Postop Assessment: no apparent nausea or vomiting Anesthetic complications: no   No complications documented.  Last Vitals:  Vitals:   05/27/20 2020 05/27/20 2150  BP: 111/79   Pulse: 92   Resp: 20   Temp: 37.1 C   SpO2: 96% 99%    Last Pain:  Vitals:   05/27/20 2020  TempSrc: Oral  PainSc:                  Tiajuana Amass

## 2020-05-28 NOTE — Evaluation (Signed)
Clinical/Bedside Swallow Evaluation Patient Details  Name: Peter Wood MRN: [id number] Date of Birth: April 19, 1935  Today's Date: 05/28/2020 Time: SLP Start Time (ACUTE ONLY): 0910 SLP Stop Time (ACUTE ONLY): 0945 SLP Time Calculation (min) (ACUTE ONLY): 35 min  Past Medical History:  Past Medical History:  Diagnosis Date  . Arthritis   . Chronic diastolic CHF (congestive heart failure) ([place]) 05/27/2020  . Diverticulosis of colon   . Family history of breast cancer   . Family history of prostate cancer   . HTN (hypertension)   . Hx of colonic polyp   . Pneumonia    as a child/baby  . Poor circulation of extremity    right leg  . Prostate cancer ([place]) 2015   prostate cancer - radiation treated  . Pulmonary embolism ([place])    on eliquis    Past Surgical History:  Past Surgical History:  Procedure Laterality Date  . [place] VITRECTOMY WITH 20 GAUGE MVR PORT FOR MACULAR HOLE Right 09/19/2016   Procedure: 25 GAUGE PARS PLANA VITRECTOMY WITH 20 GAUGE MVR PORT FOR MACULAR HOLE, MEMBRANE PEEL, SERUM PATCH, GAS FLUID EXCHANGE, HEADSCOPE LASER;  Surgeon: [name], MD;  Location: [facility];  Service: Ophthalmology;  Laterality: Right;  . BIOPSY  09/20/2018   Procedure: BIOPSY;  Surgeon: Gatha Mayer, MD;  Location[facility];  Service: Endoscopy;;  . BRONCHIAL WASHINGS  05/26/2020   Procedure: BRONCHIAL WASHINGS;  Surgeon: Freddi Starr, MD;  Location: [facility] ENDOSCOPY;  Service: Pulmonary;;  . CATARACT EXTRACTION  08/27/12   Right  . COLONOSCOPY    . ESOPHAGOGASTRODUODENOSCOPY (EGD) WITH PROPOFOL N/A 09/20/2018   Procedure: ESOPHAGOGASTRODUODENOSCOPY (EGD) WITH PROPOFOL;  Surgeon: Gatha Mayer, MD;  Location: [facility];  Service: Endoscopy;  Laterality: N/A;  . EYE SURGERY Right 2018   right and left  . HAND SURGERY  2016   right thumb  . IR IMAGING GUIDED PORT INSERTION  11/04/2018  . KNEE SURGERY Left 1989  . VIDEO BRONCHOSCOPY N/A 05/26/2020   Procedure:  VIDEO BRONCHOSCOPY WITHOUT FLUORO;  Surgeon: Freddi Starr, MD;  Location: [facility] ENDOS[facility];  Service: Pulmonary;  Laterality: N/A;   HPI:  85 year old male with past medical history of PE in 2020, hypertension, gastric adenocarcinoma with metastases to liver and bone that was diagnosed in January 2020 status post chemotherapy with good response slow progression who was referred from the oncology clinic for evaluation of persistent fever and dyspnea and presented to the emergency room on 9/14.  Pt found to have pna   Assessment / Plan / Recommendation Clinical Impression  Pt with negative CN exam except minimal decreased left eye opening.  His voice is mildly hoarse which he attributes to his coughing - he is also s/p bronch.  Pt's largest complaint re: dysphagia is his decreased ability to transit food into throat over the last 2-3 weeks - gradual onset but remaining stable.  He states he has to use liquid to transit it.  Minimal area of abrasion on posterior left buccal region noted but pt denies pain. Observed pt consuming intake of coffee, orange juice, grits, sausage, toast and potatoes.  He eats slowly and uses purees *grits* or liquids to transit solids that are difficult for him. No dysarthria present nor lingual weakness.  Poor intake noted and pt reports it is due to difficulty swallowing but also gustatory changes. He does consume Boost at home once a day - note RD following.  Pt today fed himself  slowly and was able to transit solids - he stated "I can swallow".  No increased work of breathing noted with intake - nor pt complaint of dysphagia.  SLP located pt's partial in his pant pockets and brushed it - while he brushed his teeth and gums.  He was able to place partial and masticate adequately.  Would not advise limiting pt's diet due to his poor intake.  Also question if pt's chemo treatment may be impacting his gustatory sense.  Will follow up x1 to assure tolerance, education.  Marland Kitchen SLP Visit  Diagnosis: Dysphagia, oral phase (R13.11)    Aspiration Risk  Mild aspiration risk    Diet Recommendation Regular;Thin liquid   Liquid Administration via: Cup;Straw Medication Administration: Whole meds with liquid Supervision: Patient able to self feed Compensations: Slow rate;Small sips/bites;Other (Comment);Follow solids with liquid Postural Changes: Seated upright at 90 degrees;Remain upright for at least 30 minutes after po intake    Other  Recommendations Oral Care Recommendations: Other (Comment) (oral care after meals)   Follow up Recommendations None      Frequency and Duration min 1 x/week  1 week       Prognosis Prognosis for Safe Diet Advancement: Good      Swallow Study   General Date of Onset: 05/28/20 HPI: 85 year old male with past medical history of PE in 2020, hypertension, gastric adenocarcinoma with metastases to liver and bone that was diagnosed in January 2020 status post chemotherapy with good response slow progression who was referred from the oncology clinic for evaluation of persistent fever and dyspnea and presented to the emergency room on 9/14.  Pt found to have pna Type of Study: Bedside Swallow Evaluation Diet Prior to this Study: Regular;Thin liquids Temperature Spikes Noted: No Respiratory Status: Other (comment) (HFNC) History of Recent Intubation: No Behavior/Cognition: Alert Oral Cavity Assessment: Within Functional Limits Oral Care Completed by SLP: Other (Comment) (has pt brush his teeth and SlP brushed his partial) Oral Cavity - Dentition: Adequate natural dentition;Other (Comment);Missing dentition (upper partial) Vision: Functional for self-feeding Self-Feeding Abilities: Able to feed self Patient Positioning: Upright in bed Baseline Vocal Quality: Normal Volitional Cough: Strong Volitional Swallow: Able to elicit    Oral/Motor/Sensory Function Overall Oral Motor/Sensory Function: Within functional limits   Ice Chips Ice chips:  Within functional limits   Thin Liquid Thin Liquid: Within functional limits    Nectar Thick Nectar Thick Liquid: Not tested   Honey Thick Honey Thick Liquid: Not tested   Puree Puree: Within functional limits Presentation: Self Fed;Spoon   Solid     Solid: Within functional limits Presentation: Self Fed      [name] 05/28/2020,11:02 AM  [name], MS [place] Office [phone]

## 2020-05-28 NOTE — Progress Notes (Signed)
  Speech Language Pathology Treatment: Dysphagia  Patient Details Name: Peter Wood MRN: [id number] DOB: [birth year] Today's Date: 05/28/2020 Time: 7989-2119 SLP Time Calculation (min) (ACUTE ONLY): 35 min  Assessment / Plan / Recommendation Clinical Impression  Separate session with pt for education regarding compensating for his oral dysphagia.  Pt again denies refluxing, heart burn nor odynophagia.  3 ounce  [place] water challenge conducted with pt easily passing.  He was noted to clear his throat during meal * not before* however doubtful due to swallowing issues.    SLP reviewed recommendations to start meals with liquids to moisten mouth due to c/o oral dysphagia, use liquid or purees to transit food into throat if having difficulty, clean mouth after meals *swish and spit at least*, rest if short of breath to prevent/minimize aspiration and eat frequent small meals daily.  Assured need to take partial out nightly and brush/clean to assure oral cavity clean to decrease asp pna risk.  If pt has difficulty with pill transiting, reviewed option to take with puree.     In addition, SLP discussed with pt 3 pillars of aspiration pna including 1. Compromised immune system  2. Poor oral health  3. Aspiration.  Pt currently appears with only the first risk factor thus asp pna risk appears low.    Provided pt with eating/drinking tips for people with respiratory issues,  heimlich maneuver in writing and discussed/demonstrated universal sign of choking.   Using teach back, pt able to verbalize 3 important recommendations, compensations and clinical reasoning.   No SLP follow up needed as pt educated and managing well at this time. Thanks for this consult of this most pleasant pt.     HPI HPI: 85 year old male with past medical history of PE in 2020, hypertension, gastric adenocarcinoma with metastases to liver and bone that was diagnosed in January 2020 status post chemotherapy with good response slow  progression who was referred from the oncology clinic for evaluation of persistent fever and dyspnea and presented to the emergency room on 9/14.  Pt found to have pna.  Pt underwent BSE and follow up x1 indicated for detailed education.      SLP Plan  All goals met       Recommendations  Diet recommendations: Regular;Thin liquid Liquids provided via: Cup;Straw Medication Administration: Whole meds with liquid Compensations: Slow rate;Small sips/bites;Other (Comment);Follow solids with liquid (use puree to clear food as needed, clean mouth after meals) Postural Changes and/or Swallow Maneuvers: Seated upright 90 degrees;Upright 30-60 min after meal                Oral Care Recommendations: Other (Comment) (after meals, partial out nightly) Follow up Recommendations: None SLP Visit Diagnosis: Dysphagia, oral phase (R13.11) Plan: All goals met       GO               Peter Lime, MS [facility] SLP Acute Rehab Services Office [phone]  Peter Wood 05/28/2020, 11:06 AM

## 2020-05-28 NOTE — Progress Notes (Signed)
CCMD notified nurses that Patient had an non-symptomatic 14 beat run of SVT. Patient was at rest in bed during event.  VSS. HR ranging 136-150s.  Will continue to monitor. Will make oncoming day shift nurse aware.

## 2020-05-28 NOTE — Progress Notes (Signed)
Pt O2 came off as he was adjusting himself in the bed. Upon RN entering room Pt O2 stat was 40's. O2 reapplied and O2 up to 80-85% on 10L. O2 increased to 11L and O2 up to 88-90%

## 2020-05-28 NOTE — Progress Notes (Addendum)
PROGRESS NOTE    PARTICK Wood  UEA:[id number] DOB: 20-Feb-1935 DOA: 05/25/2020 PCP: Cassandria Anger, MD    Chief Complaint  Patient presents with  . Shortness of Breath  . Fever  . Leg Swelling  . Cough    Brief Narrative:  Peter Wood is a 85 y.o. male with HTN, PE 2020, gastric adenocarcinoma with mets to liver and bone Dx 09/2018 s/p chemotherapy with good response initially but slow progression more recently on Enhertu last received 04/28/2020 who was referred from oncology clinic for evaluation of persistent fever and dyspnea.   He reports recurrent fevers and dyspnea despite treatment. Symptoms started 3 to 4 weeks ago marked by significant dyspnea, cough productive of yellowish phlegm and recurrent fevers. He denies any wheezing, chest pain/tightness, leg swelling or orthopnea.  CXR 04/28/2020 with bilateral infiltrates however Covid PCR was negative. Despite negative Covid test and given high risk, he receivedmonoclonal antibody casirivimab/imdevimab 04/29/2020 followed by a course of azithromycin without significant improvement. He is referred to ED by oncology  CTA chest obtained in the ED without evidence of PE but extensive bilateral asymmetric airspace opacities.  Rule out atypical pneumonia.  Evidence of pulmonary hypertension.  Subjective:  He is on high flow nasal cannula, able to talk in full sentences, no accessory muscle use, he denies pain Complaining of difficulty swallowing  RN reports patient had one episode of NSVT (14bests) last night   Assessment & Plan:   Principal Problem:   Acute respiratory failure with hypoxia ([place]) Active Problems:   Essential hypertension   Pulmonary embolism (HCC)   Liver metastases ([place])   Metastasis from malignant tumor of stomach (HCC)   Gastric cancer ([place])   Iron deficiency anemia due to chronic blood loss   Multifocal pneumonia   Hypoalbuminemia   Abnormal LFTs   Chronic diastolic CHF (congestive heart  failure) (HCC)  Acute respiratory failure with hypoxia secondary to multifocal pneumonia versus nephrogenic spread of the tumor versus enhertu induced pneumonitis, is currently on high flow nasal cannula, O2 sat 89%, respiration rate range from 22-26 -He presented with fever, tachycardia, tachypnea, hypoxia -Blood culture no growth, SARS-CoV-2 screening negative, MRSA screening negative -CTA chest obtained in the ED without evidence of PE but extensive bilateral asymmetric airspace opacities. -S/ p bronchoscopy, bronchial lavage culture negative, pathology pending -Is started on Vanco and Unasyn in the ED, vancomycin discontinued after MRSA screening negative, he is continued on Unasyn --IV Solu-Medrol started by pulmonology to cover enhertu induced  Pneumonitis -Appreciate pulmonology input, will follow recommendation.   Chronic combined heart failure echocardiogram was completed on 03/29/2020 and showed an EF of 50-55% with grade 1 diastolic dysfunction Currently appear euvolemic to dry  NSVTx1 , does not appear to have symptom -check mag - will start lopressor   History of PE, diagnosed in January 2020 at the  Same time of gastric adenocarcinoma diagnosis  Was briefly on heparin drip in preparation for bronchoscopy , now back on eliquis  Hypertension bp low normal , d/c norvasc, d/c lotensin Start low dose lopressor  monitor  metastatic gastric adenocarcinoma with liver mets -h/o Bleeding gastric ulcer in 09/2018 underwent EGD with biopsy showed metastatic gastric adenocarcinoma -He was started on Enhertu on 12/23/2019. He receives this every three weeks with mixed response , last dose on 8/18 -chronic elevated lft  Anemia of chronic disease in the setting of malignancy, could also have chronic blood loss from gastric cancer, no overt sign of bleeding Hgb 9, monitor  hemoglobin  History of prostate cancer  FTT Body mass index is 23.17 kg/m.    DVT prophylaxis: apixaban  (ELIQUIS) tablet 2.5 mg Start: 05/27/20 1200 apixaban (ELIQUIS) tablet 2.5 mg   Code Status:full Family Communication: Called wife with permission and left message, patient declined my offer to  call his children Disposition:   Status is: Inpatient  Dispo: The patient is from: home              Anticipated d/c is to: TBD              Anticipated d/c date is: TBD              Patient currently on HFNC  Consultants:   Pulmonology  Procedures:   Bronchoscopy  Antimicrobials:    vancx1 in the ED Unasyn since admission    Objective: Vitals:   05/27/20 2150 05/28/20 0130 05/28/20 0549 05/28/20 1127  BP:  124/81 128/81   Pulse:  78 83   Resp:   18   Temp:  97.8 F (36.6 C) 97.8 F (36.6 C)   TempSrc:  Oral Oral   SpO2: 99% 96% (!) 89% 92%  Weight:      Height:        Intake/Output Summary (Last 24 hours) at 05/28/2020 1251 Last data filed at 05/28/2020 0612 Gross per 24 hour  Intake 495.04 ml  Output 300 ml  Net 195.04 ml   Filed Weights   05/25/20 1744 05/26/20 1234  Weight: 61.2 kg 61.2 kg    Examination:  General exam: frail, appear weak Respiratory system: diminished with mild crackles, no wheezing.  Slightly tachypneic, no accessory muscle use Cardiovascular system: S1 & S2 heard, RRR. No JVD, no murmur, No pedal edema. Gastrointestinal system: Abdomen is nondistended, soft and nontender.  Normal bowel sounds heard. Central nervous system: Alert and oriented. No focal neurological deficits. Extremities: Generalized weakness Skin: No rashes, lesions or ulcers Psychiatry: Judgement and insight appear normal. Mood & affect appropriate.     Data Reviewed: I have personally reviewed following labs and imaging studies  CBC: Recent Labs  Lab 05/25/20 1552 05/26/20 0448 05/27/20 0304 05/28/20 0420  WBC 10.0 11.4* 15.2* 7.5  NEUTROABS 7.8*  --   --   --   HGB 10.4* 9.7* 9.4* 9.1*  HCT 31.3* 29.6* 28.0* 27.8*  MCV 101.6* 104.2* 102.9* 103.3*  PLT  318 305 303 606    Basic Metabolic Panel: Recent Labs  Lab 05/25/20 1552 05/26/20 0448 05/27/20 0304 05/28/20 0420  NA 133* 135 134* 142  K 4.1 3.9 3.9 3.8  CL 96* 100 98 101  CO2 _0 GLUCOSE 94 94 106* 153*  BUN _1 24*  CREATININE 0.80 0.71 0.75 0.72  CALCIUM 9.1 8.3* 8.0* 8.7*    GFR: Estimated Creatinine Clearance: 59.6 mL/min (by C-G formula based on SCr of 0.72 mg/dL).  Liver Function Tests: Recent Labs  Lab 05/25/20 1552 05/26/20 0448 05/27/20 0304 05/28/20 0420  AST 116* 79* 52* 54*  ALT 86* 63* 46* 43  ALKPHOS 533* 386* 358* 374*  BILITOT 1.1 0.9 1.5* 1.2  PROT 6.8 6.1* 5.6* 5.9*  ALBUMIN 2.2* 2.2* 2.0* 1.8*    CBG: No results for input(s): GLUCAP in the last 168 hours.   Recent Results (from the past 240 hour(s))  Culture, Blood     Status: None (Preliminary result)   Collection Time: 05/25/20  3:52 PM   Specimen: BLOOD  Result Value  Ref Range Status   Specimen Description BLOOD LEFT ANTECUBITAL  Final   Special Requests   Final    BOTTLES DRAWN AEROBIC AND ANAEROBIC Blood Culture results may not be optimal due to an excessive volume of blood received in culture bottles   Culture   Final    NO GROWTH 3 DAYS Performed at [facility] [address], [place], [place] [address]    Report Status PENDING  Incomplete  Culture, Blood     Status: None (Preliminary result)   Collection Time: 05/25/20  4:48 PM   Specimen: BLOOD  Result Value Ref Range Status   Specimen Description BLOOD PORTA CATH  Final   Special Requests   Final    BOTTLES DRAWN AEROBIC AND ANAEROBIC Blood Culture adequate volume   Culture   Final    NO GROWTH 3 DAYS Performed at [facility], [address], [place], [place] [address]    Report Status PENDING  Incomplete  SARS Coronavirus 2 by RT PCR (hospital order, performed in [facility] hospital lab) Nasopharyngeal Nasopharyngeal Swab     Status: None   Collection Time: 05/25/20  6:05 PM    Specimen: Nasopharyngeal Swab  Result Value Ref Range Status   SARS Coronavirus 2 NEGATIVE NEGATIVE Final    Comment: (NOTE) SARS-CoV-2 target nucleic acids are NOT DETECTED.  The SARS-CoV-2 RNA is generally detectable in upper and lower respiratory specimens during the acute phase of infection. The lowest concentration of SARS-CoV-2 viral copies this assay can detect is 250 copies / mL. A negative result does not preclude SARS-CoV-2 infection and should not be used as the sole basis for treatment or other patient management decisions.  A negative result may occur with improper specimen collection / handling, submission of specimen other than nasopharyngeal swab, presence of viral mutation(s) within the areas targeted by this assay, and inadequate number of viral copies (<250 copies / mL). A negative result must be combined with clinical observations, patient history, and epidemiological information.  Fact Sheet for Patients:   StrictlyIdeas.no  Fact Sheet for Healthcare Providers: BankingDealers.co.za  This test is not yet approved or  cleared by the [place] FDA and has been authorized for detection and/or diagnosis of SARS-CoV-2 by FDA under an Emergency Use Authorization (EUA).  This EUA will remain in effect (meaning this test can be used) for the duration of the COVID-19 declaration under Section 564(b)(1) of the Act, 21 U.S.C. section 360bbb-3(b)(1), unless the authorization is terminated or revoked sooner.  Performed at [facility], [place] [address], [place], [place] [address]   MRSA PCR Screening     Status: None   Collection Time: 05/25/20 11:45 PM   Specimen: Nasal Mucosa; Nasopharyngeal  Result Value Ref Range Status   MRSA by PCR NEGATIVE NEGATIVE Final    Comment:        The GeneXpert MRSA Assay (FDA approved for NASAL specimens only), is one component of a comprehensive MRSA  colonization surveillance program. It is not intended to diagnose MRSA infection nor to guide or monitor treatment for MRSA infections. Performed at [facility], [place] [address] [place],  [address]   Pneumocystis smear by DFA     Status: None   Collection Time: 05/26/20  1:26 PM   Specimen: Bronchial Alveolar Lavage; Respiratory  Result Value Ref Range Status   Specimen Source-PJSRC BRONCHIAL ALVEOLAR LAVAGE  Final    Comment: RML   Pneumocystis jiroveci Ag NEGATIVE  Final    Comment: Performed at [facility] Performed at [facility], [place] [address], [place], [place] [address]   Culture, respiratory     Status: None (Preliminary result)   Collection Time: 05/26/20  1:37 PM   Specimen: Bronchoalveolar Lavage; Respiratory  Result Value Ref Range Status   Specimen Description   Final    BRONCHIAL ALVEOLAR LAVAGE RML Performed at [facility] [address], [place], [place] [address]    Special Requests   Final    NONE Performed at [facility], [place] [address] [place], [place] [address]    Gram Stain   Final    MODERATE WBC PRESENT, PREDOMINANTLY MONONUCLEAR NO ORGANISMS SEEN    Culture   Final    NO GROWTH 2 DAYS Performed at [facility] [address] [place], [place] [address]    Report Status PENDING  Incomplete  Culture, respiratory     Status: None (Preliminary result)   Collection Time: 05/26/20  1:54 PM   Specimen: Bronchoalveolar Lavage; Respiratory  Result Value Ref Range Status   Specimen Description   Final    BRONCHIAL ALVEOLAR LAVAGE RUL Performed at [facility] [address], [place], [place] [address]    Special Requests   Final    NONE Performed at [facility], [place] [address], [place], [place] [address]    Gram Stain   Final    MODERATE WBC PRESENT, PREDOMINANTLY MONONUCLEAR NO ORGANISMS SEEN     Culture   Final    NO GROWTH 2 DAYS Performed at [facility] [address] [place], [place] [address]    Report Status PENDING  Incomplete  Pneumocystis smear by DFA     Status: None   Collection Time: 05/26/20  1:54 PM   Specimen: Bronchial Alveolar Lavage; Respiratory  Result Value Ref Range Status   Specimen Source-PJSRC BRONCHIAL ALVEOLAR LAVAGE  Final    Comment: RUL   Pneumocystis jiroveci Ag NEGATIVE  Final    Comment: Performed at [facility] Performed at [facility], [place] [address] [place], [place] [address]   Respiratory Panel by PCR     Status: None   Collection Time: 05/26/20  6:19 PM   Specimen: Nasopharyngeal Swab; Respiratory  Result Value Ref Range Status   Adenovirus NOT DETECTED NOT DETECTED Final   Coronavirus 229E NOT DETECTED NOT DETECTED Final    Comment: (NOTE) The Coronavirus on the Respiratory Panel, DOES NOT test for the novel  Coronavirus (2019 nCoV)    Coronavirus HKU1 NOT DETECTED NOT DETECTED Final   Coronavirus NL63 NOT DETECTED NOT DETECTED Final   Coronavirus OC43 NOT DETECTED NOT DETECTED Final   Metapneumovirus NOT DETECTED NOT DETECTED Final   Rhinovirus / Enterovirus NOT DETECTED NOT DETECTED Final   Influenza A NOT DETECTED NOT DETECTED Final   Influenza B NOT DETECTED NOT DETECTED Final   Parainfluenza Virus 1 NOT DETECTED NOT DETECTED Final   Parainfluenza Virus 2 NOT DETECTED NOT DETECTED Final   Parainfluenza Virus 3 NOT DETECTED NOT DETECTED Final   Parainfluenza Virus 4 NOT DETECTED NOT DETECTED Final   Respiratory Syncytial Virus NOT DETECTED NOT DETECTED Final   Bordetella pertussis NOT DETECTED NOT DETECTED Final   Chlamydophila pneumoniae NOT DETECTED NOT DETECTED Final   Mycoplasma pneumoniae NOT DETECTED NOT DETECTED Final    Comment: Performed at [facility], [place] [address], [place], [place] [address]         Radiology Studies: No results found.      Scheduled  Meds: . amLODipine  5 mg Oral Daily  . apixaban  2.5 mg Oral BID  . benazepril  20 mg Oral Daily  . Chlorhexidine Gluconate Cloth  6 each Topical Daily  . docusate sodium  200 mg Oral Daily  . lactose free nutrition  237 mL Oral TID WC  . latanoprost  1 drop Both Eyes QHS  . methylPREDNISolone (SOLU-MEDROL) injection  60 mg Intravenous Q6H  . multivitamin with minerals  1 tablet Oral Daily  . polyethylene glycol  17 g Oral Daily   Continuous Infusions: . ampicillin-sulbactam (UNASYN) IV 3 g (05/28/20 1125)     LOS: 2 days   Time spent: 19mns Greater than 50% of this time was spent in counseling, explanation of diagnosis, planning of further management, and coordination of care.  I have personally reviewed and interpreted on  05/28/2020 daily labs, tele strips, imagings as discussed above under date review session and assessment and plans.  I reviewed all nursing notes, pharmacy notes, consultant notes,  vitals, pertinent old records  I have discussed plan of care as described above with RN , patient on 05/28/2020  Voice Recognition /Dragon dictation system was used to create this note, attempts have been made to correct errors. Please contact the author with questions and/or clarifications.   FFlorencia Reasons MD PhD FACP [facility]  Available via Epic secure chat 7am-7pm for nonurgent issues Please page for urgent issues To page the attending provider between 7A-7P or the covering provider during after hours 7P-7A, please log into the web site [email or web address] and access using universal [place] password for that web site. If you do not have the password, please call the hospital operator.    05/28/2020, 12:51 PM

## 2020-05-28 NOTE — Plan of Care (Signed)
  Problem: Nutrition: Goal: Adequate nutrition will be maintained Outcome: Progressing   Problem: Coping: Goal: Level of anxiety will decrease Outcome: Progressing   Problem: Skin Integrity: Goal: Risk for impaired skin integrity will decrease Outcome: Progressing   

## 2020-05-28 NOTE — Plan of Care (Signed)

## 2020-05-29 ENCOUNTER — Inpatient Hospital Stay (HOSPITAL_COMMUNITY): Payer: Medicare PPO

## 2020-05-29 LAB — COMPREHENSIVE METABOLIC PANEL
ALT: 71 U/L — ABNORMAL HIGH (ref 0–44)
AST: 119 U/L — ABNORMAL HIGH (ref 15–41)
Albumin: 2.1 g/dL — ABNORMAL LOW (ref 3.5–5.0)
Alkaline Phosphatase: 463 U/L — ABNORMAL HIGH (ref 38–126)
Anion gap: 10 (ref 5–15)
BUN: 31 mg/dL — ABNORMAL HIGH (ref 8–23)
CO2: 29 mmol/L (ref 22–32)
Calcium: 8.8 mg/dL — ABNORMAL LOW (ref 8.9–10.3)
Chloride: 102 mmol/L (ref 98–111)
Creatinine, Ser: 0.68 mg/dL (ref 0.61–1.24)
GFR calc Af Amer: >60 mL/min (ref >60)
GFR calc non Af Amer: >60 mL/min (ref >60)
Glucose, Bld: 127 mg/dL — ABNORMAL HIGH (ref 70–99)
Potassium: 3.8 mmol/L (ref 3.5–5.1)
Sodium: 141 mmol/L (ref 135–145)
Total Bilirubin: 0.9 mg/dL (ref 0.3–1.2)
Total Protein: 5.8 g/dL — ABNORMAL LOW (ref 6.5–8.1)

## 2020-05-29 LAB — CULTURE, RESPIRATORY W GRAM STAIN
Culture: NO GROWTH
Culture: NO GROWTH

## 2020-05-29 LAB — ACID FAST SMEAR (AFB, MYCOBACTERIA)
Acid Fast Smear: NEGATIVE
Acid Fast Smear: NEGATIVE

## 2020-05-29 LAB — MAGNESIUM: Magnesium: 2.3 mg/dL (ref 1.7–2.4)

## 2020-05-29 MED ORDER — FUROSEMIDE 10 MG/ML IJ SOLN
20.0000 mg | Freq: Once | INTRAMUSCULAR | Status: AC
  Administered 2020-05-29: 20 mg via INTRAVENOUS
  Filled 2020-05-29: qty 2

## 2020-05-29 MED ORDER — POTASSIUM CHLORIDE CRYS ER 20 MEQ PO TBCR
40.0000 meq | EXTENDED_RELEASE_TABLET | Freq: Once | ORAL | Status: AC
  Administered 2020-05-29: 40 meq via ORAL
  Filled 2020-05-29: qty 2

## 2020-05-29 NOTE — Progress Notes (Signed)
NAME:  Peter Wood, MRN:  [id number], DOB:  11/29/1934, LOS: 3 ADMISSION DATE:  05/25/2020, CONSULTATION DATE:  05/26/20 REFERRING MD: Dr. [place] Pink, CHIEF COMPLAINT:  SOB, Fever   Brief History   85 y/o M admitted 9/14 with reports of shortness of breath and fever.  He has a history of gastric adenocarcinoma with metastatic disease to liver and bone (initial diagnosis 09/2018) s/p chemotherapy with slow progression on Enhertu (last dose 04/28/20).   He had a CXR on 8/18 that demonstrated bilateral infiltrates.  COVID testing was negative at that time.  However, he was treated with monoclonal antibody (casirivimab / imdevimab) on 8/19 followed by a course of azithromycin without significant improvement.    PCCM consulted for evaluation.    Past Medical History  HTN PE - on eliquis  Prostate Cancer - s/p radiation treatment  Gastric adenocarcinoma with metastatic disease to liver and bone, initial dx 09/2018  Significant Hospital Events   9/14 Admit 9/15 PCCM consulted for pulmonary evaluation  9/16 Steroids initiated   Consults:    Procedures:    Significant Diagnostic Tests:  CTA Chest 9/15 >> negative for PE, extensive bilateral asymmetric airspace opacities, small right pleural effusion, small simple appearing pericardial effusion without CT evidence of tamponade, central pulmonary arterial enlargement FOB 9/15 >> clear airways, minimal secretions, scant bloody tinged secretions in LLL  Micro Data:  COVID 9/14 >> negative  RVP 9/15 >> negative  BAL 9/15 >>  AFB 9/15 >>  PCP by DFA 9/15 >> negative   Antimicrobials:  Unasyn 9/15 >>   Interim history/subjective:   Continues on steroids, high flow nasal cannula No acute events.  Objective   Blood pressure 127/85, pulse 96, temperature 97.7 F (36.5 C), temperature source Oral, resp. rate (!) 24, height _0  (1.626 m), weight 61.2 kg, SpO2 93 %.        Intake/Output Summary (Last 24 hours) at 05/29/2020 1148 Last  data filed at 05/29/2020 0800 Gross per 24 hour  Intake 400 ml  Output 825 ml  Net -425 ml   Filed Weights   05/25/20 1744 05/26/20 1234  Weight: 61.2 kg 61.2 kg    Examination: Gen:      No acute distress HEENT:  EOMI, sclera anicteric Neck:     No masses; no thyromegaly Lungs:    Bibasal crackles CV:         Regular rate and rhythm; no murmurs Abd:      + bowel sounds; soft, non-tender; no palpable masses, no distension Ext:    No edema; adequate peripheral perfusion Skin:      Warm and dry; no rash Neuro: alert and oriented x 3 Psych: normal mood and affect  Resolved Hospital Problem list      Assessment & Plan:    Acute Hypoxic Respiratory Failure in setting of Diffuse Bilateral Infiltrates  Fever  Hx Pulmonary Embolism on Eliquis  Recent hx of negative COVID testing, however treated with monoclonal antibodies on 8/19 & azithromycin.  Differential dx includes viral / bacterial infection, component pulmonary edema with noted central pulmonary arterial enlargement, BOOP/COP, pneumonitis / ILD associated with trastuzumab and lymphangitic spread of malignancy. CT angio negative for PE and he is on chronic anticoagulation for prior PE. Minimal secretions on bronchoscopy. Suspect most likely dx is pneumonitis vs malignant spread.   Continue Eliquis, Unasyn Solu-Medrol at current dose Intermittent chest x-ray Bronchoscopy results negative to date Pulmonary hygiene, mobilize, SLP eval  Best practice:  Diet: As  tolerated, per primary  Pain/Anxiety/Delirium protocol (if indicated): n/a  VAP protocol (if indicated): n/a  DVT prophylaxis: heparin gtt  GI prophylaxis: per primary  Glucose control: per primary  Mobility: as tolerated  Code Status: Full Code. Family Communication: Patient updated on plan of care 9/18  Disposition: Per primary   Labs   CBC: Recent Labs  Lab 05/25/20 1552 05/26/20 0448 05/27/20 0304 05/28/20 0420  WBC 10.0 11.4* 15.2* 7.5  NEUTROABS  7.8*  --   --   --   HGB 10.4* 9.7* 9.4* 9.1*  HCT 31.3* 29.6* 28.0* 27.8*  MCV 101.6* 104.2* 102.9* 103.3*  PLT 318 305 303 858    Basic Metabolic Panel: Recent Labs  Lab 05/25/20 1552 05/26/20 0448 05/27/20 0304 05/28/20 0420 05/29/20 0331  NA 133* 135 134* 142 141  K 4.1 3.9 3.9 3.8 3.8  CL 96* 100 98 101 102  CO2 _0 GLUCOSE 94 94 106* 153* 127*  BUN _1 24* 31*  CREATININE 0.80 0.71 0.75 0.72 0.68  CALCIUM 9.1 8.3* 8.0* 8.7* 8.8*  MG  --   --   --   --  2.3   GFR: Estimated Creatinine Clearance: 59.6 mL/min (by C-G formula based on SCr of 0.68 mg/dL). Recent Labs  Lab 05/25/20 1552 05/25/20 1805 05/25/20 2130 05/26/20 0448 05/27/20 0304 05/27/20 0305 05/28/20 0420  PROCALCITON  --  1.96  --   --   --  11.09  --   WBC 10.0  --   --  11.4* 15.2*  --  7.5  LATICACIDVEN  --  1.4 1.3  --   --   --   --     Liver Function Tests: Recent Labs  Lab 05/25/20 1552 05/26/20 0448 05/27/20 0304 05/28/20 0420 05/29/20 0331  AST 116* 79* 52* 54* 119*  ALT 86* 63* 46* 43 71*  ALKPHOS 533* 386* 358* 374* 463*  BILITOT 1.1 0.9 1.5* 1.2 0.9  PROT 6.8 6.1* 5.6* 5.9* 5.8*  ALBUMIN 2.2* 2.2* 2.0* 1.8* 2.1*   No results for input(s): LIPASE, AMYLASE in the last 168 hours. No results for input(s): AMMONIA in the last 168 hours.  ABG No results found for: PHART, PCO2ART, PO2ART, HCO3, TCO2, ACIDBASEDEF, O2SAT   Coagulation Profile: Recent Labs  Lab 05/26/20 0448 05/27/20 0304 05/28/20 0420  INR 1.5* 1.6* 1.8*    Cardiac Enzymes: No results for input(s): CKTOTAL, CKMB, CKMBINDEX, TROPONINI in the last 168 hours.  HbA1C: No results found for: HGBA1C  CBG: No results for input(s): GLUCAP in the last 168 hours.     Critical care time: n/a     Marshell Garfinkel MD [facility] Please see [email or web address] for pager details.  05/29/2020, 11:55 AM

## 2020-05-29 NOTE — Progress Notes (Signed)
AST/ALT trending up

## 2020-05-29 NOTE — Progress Notes (Signed)
Patient's O2 sats 78% on 11L HFNC.  RN entered room, patient sitting up in bed starting to eat breakfast.  Reports he gets short of breath when eating.  O2 increased to 13L HFNC and sats increased to 93%.  Patient reports improvement and able to eat breakfast.

## 2020-05-29 NOTE — Progress Notes (Signed)
PROGRESS NOTE    [name]  VOH:[id number] DOB: 10-09-1934 DOA: 05/25/2020 PCP: Cassandria Anger, MD    Chief Complaint  Patient presents with  . Shortness of Breath  . Fever  . Leg Swelling  . Cough    Brief Narrative:  Peter Wood is a 85 y.o. male with HTN, PE 2020, gastric adenocarcinoma with mets to liver and bone Dx 09/2018 s/p chemotherapy with good response initially but slow progression more recently on Enhertu last received 04/28/2020 who was referred from oncology clinic for evaluation of persistent fever and dyspnea.   He reports recurrent fevers and dyspnea despite treatment. Symptoms started 3 to 4 weeks ago marked by significant dyspnea, cough productive of yellowish phlegm and recurrent fevers. He denies any wheezing, chest pain/tightness, leg swelling or orthopnea.  CXR 04/28/2020 with bilateral infiltrates however Covid PCR was negative. Despite negative Covid test and given high risk, he receivedmonoclonal antibody casirivimab/imdevimab 04/29/2020 followed by a course of azithromycin without significant improvement. He is referred to ED by oncology  CTA chest obtained in the ED without evidence of PE but extensive bilateral asymmetric airspace opacities.  Rule out atypical pneumonia.  Evidence of pulmonary hypertension.  Subjective:  He is on high flow nasal cannula, able to talk in full sentences, no accessory muscle use, he denies pain  Per RN, patient "Reports he gets short of breath when eating.  O2 increased to 13L HFNC and sats increased to 93%.  Patient reports improvement and able to eat breakfast."    Assessment & Plan:   Principal Problem:   Acute respiratory failure with hypoxia (HCC) Active Problems:   Essential hypertension   Pulmonary embolism (HCC)   Liver metastases (HCC)   Metastasis from malignant tumor of stomach (HCC)   Gastric cancer (HCC)   Iron deficiency anemia due to chronic blood loss   Multifocal pneumonia    Hypoalbuminemia   Abnormal LFTs   Chronic diastolic CHF (congestive heart failure) (HCC)  Acute respiratory failure with hypoxia secondary to multifocal pneumonia versus lymphangitic spread of the tumor versus enhertu induced pneumonitis, is currently on high flow nasal cannula,  respiration rate range from 17-30 -He presented with fever 101.5, tachycardia, tachypnea, hypoxia, fever has resolved  -Blood culture no growth, SARS-CoV-2 screening negative, MRSA screening negative -CTA chest obtained in the ED without evidence of PE but extensive bilateral asymmetric airspace opacities. -S/ p bronchoscopy, bronchial lavage culture negative, pathology pending -Is started on Vanco and Unasyn in the ED, vancomycin discontinued after MRSA screening negative, he is continued on Unasyn --IV Solu-Medrol started by pulmonology to cover enhertu induced  Pneumonitis -He received Lasix 20x1 on September 16, will give another IV Lasix 20 today -Appreciate pulmonology input, will follow recommendation.   Chronic combined heart failure echocardiogram was completed on 03/29/2020 and showed an EF of 50-55% with grade 1 diastolic dysfunction He received Lasix 20x1 on September 16, will give another IV Lasix 20 today Close monitor volume status  NSVTx1 on 9/16-17night, does not appear to have symptom - mag 2.3, gave potassium 40 mEq x 1 today to keep K greater than 4 - started on lopressor 12.5 mg twice a day   History of PE, diagnosed in January 2020 at the  Same time of gastric adenocarcinoma diagnosis  Was briefly on heparin drip in preparation for bronchoscopy , now back on eliquis  Hypertension bp low normal , d/c norvasc, d/c lotensin Started lopressor 12.5 mg twice daily monitor  metastatic gastric adenocarcinoma  with liver mets -h/o Bleeding gastric ulcer in 09/2018 underwent EGD with biopsy showed metastatic gastric adenocarcinoma -He was started on Enhertu on 12/23/2019. He receives this every  three weeks with mixed response , last dose on 8/18 -chronic elevated lft  Anemia of chronic disease in the setting of malignancy, could also have chronic blood loss from gastric cancer, no overt sign of bleeding Hgb 9, monitor hemoglobin  History of prostate cancer  FTT Body mass index is 23.17 kg/m.    DVT prophylaxis: apixaban (ELIQUIS) tablet 2.5 mg Start: 05/27/20 1200 apixaban (ELIQUIS) tablet 2.5 mg   Code Status:full Family Communication:  wife over the phone with permission Disposition:   Status is: Inpatient  Dispo: The patient is from: home              Anticipated d/c is to: TBD              Anticipated d/c date is: TBD              Patient currently on HFNC, awaiting pathology report  Consultants:   Pulmonology  Procedures:   Bronchoscopy  Antimicrobials:    vancx1 in the ED Unasyn since admission    Objective: Vitals:   05/28/20 1337 05/28/20 1339 05/28/20 2101 05/29/20 0539  BP:   104/66 122/87  Pulse:   80 85  Resp:   20 (!) 24  Temp:   97.9 F (36.6 C) 97.7 F (36.5 C)  TempSrc:   Oral Oral  SpO2: (!) 88% (!) 89% 95% 94%  Weight:      Height:        Intake/Output Summary (Last 24 hours) at 05/29/2020 0716 Last data filed at 05/29/2020 0600 Gross per 24 hour  Intake 526.76 ml  Output 925 ml  Net -398.24 ml   Filed Weights   05/25/20 1744 05/26/20 1234  Weight: 61.2 kg 61.2 kg    Examination:  General exam: frail, appear weak Respiratory system: diminished with mild crackles, no wheezing.  Slightly tachypneic, no accessory muscle use Cardiovascular system: S1 & S2 heard, RRR. No JVD, no murmur, No pedal edema. Gastrointestinal system: Abdomen is nondistended, soft and nontender.  Normal bowel sounds heard. Central nervous system: Alert and orientedx3. No focal neurological deficits. Extremities: Generalized weakness Skin: No rashes, lesions or ulcers Psychiatry: Judgement and insight appear normal. Mood & affect appropriate.       Data Reviewed: I have personally reviewed following labs and imaging studies  CBC: Recent Labs  Lab 05/25/20 1552 05/26/20 0448 05/27/20 0304 05/28/20 0420  WBC 10.0 11.4* 15.2* 7.5  NEUTROABS 7.8*  --   --   --   HGB 10.4* 9.7* 9.4* 9.1*  HCT 31.3* 29.6* 28.0* 27.8*  MCV 101.6* 104.2* 102.9* 103.3*  PLT 318 305 303 625    Basic Metabolic Panel: Recent Labs  Lab 05/25/20 1552 05/26/20 0448 05/27/20 0304 05/28/20 0420 05/29/20 0331  NA 133* 135 134* 142 141  K 4.1 3.9 3.9 3.8 3.8  CL 96* 100 98 101 102  CO2 _0 GLUCOSE 94 94 106* 153* 127*  BUN _1 24* 31*  CREATININE 0.80 0.71 0.75 0.72 0.68  CALCIUM 9.1 8.3* 8.0* 8.7* 8.8*  MG  --   --   --   --  2.3    GFR: Estimated Creatinine Clearance: 59.6 mL/min (by C-G formula based on SCr of 0.68 mg/dL).  Liver Function Tests: Recent Labs  Lab 05/25/20 1552 05/26/20  1224 05/27/20 0304 05/28/20 0420 05/29/20 0331  AST 116* 79* 52* 54* 119*  ALT 86* 63* 46* 43 71*  ALKPHOS 533* 386* 358* 374* 463*  BILITOT 1.1 0.9 1.5* 1.2 0.9  PROT 6.8 6.1* 5.6* 5.9* 5.8*  ALBUMIN 2.2* 2.2* 2.0* 1.8* 2.1*    CBG: No results for input(s): GLUCAP in the last 168 hours.   Recent Results (from the past 240 hour(s))  Culture, Blood     Status: None (Preliminary result)   Collection Time: 05/25/20  3:52 PM   Specimen: BLOOD  Result Value Ref Range Status   Specimen Description BLOOD LEFT ANTECUBITAL  Final   Special Requests   Final    BOTTLES DRAWN AEROBIC AND ANAEROBIC Blood Culture results may not be optimal due to an excessive volume of blood received in culture bottles   Culture   Final    NO GROWTH 3 DAYS Performed at [facility], [place] [address] [place] [address], [place], [place] [address]    Report Status PENDING  Incomplete  Culture, Blood     Status: None (Preliminary result)   Collection Time: 05/25/20  4:48 PM   Specimen: BLOOD  Result Value Ref Range Status   Specimen Description BLOOD PORTA CATH   Final   Special Requests   Final    BOTTLES DRAWN AEROBIC AND ANAEROBIC Blood Culture adequate volume   Culture   Final    NO GROWTH 3 DAYS Performed at [facility], [address], [place], [place] [address]    Report Status PENDING  Incomplete  SARS Coronavirus 2 by RT PCR (hospital order, performed in [facility] hospital lab) Nasopharyngeal Nasopharyngeal Swab     Status: None   Collection Time: 05/25/20  6:05 PM   Specimen: Nasopharyngeal Swab  Result Value Ref Range Status   SARS Coronavirus 2 NEGATIVE NEGATIVE Final    Comment: (NOTE) SARS-CoV-2 target nucleic acids are NOT DETECTED.  The SARS-CoV-2 RNA is generally detectable in upper and lower respiratory specimens during the acute phase of infection. The lowest concentration of SARS-CoV-2 viral copies this assay can detect is 250 copies / mL. A negative result does not preclude SARS-CoV-2 infection and should not be used as the sole basis for treatment or other patient management decisions.  A negative result may occur with improper specimen collection / handling, submission of specimen other than nasopharyngeal swab, presence of viral mutation(s) within the areas targeted by this assay, and inadequate number of viral copies (<250 copies / mL). A negative result must be combined with clinical observations, patient history, and epidemiological information.  Fact Sheet for Patients:   StrictlyIdeas.no  Fact Sheet for Healthcare Providers: BankingDealers.co.za  This test is not yet approved or  cleared by the [place] FDA and has been authorized for detection and/or diagnosis of SARS-CoV-2 by FDA under an Emergency Use Authorization (EUA).  This EUA will remain in effect (meaning this test can be used) for the duration of the COVID-19 declaration under Section 564(b)(1) of the Act, 21 U.S.C. section 360bbb-3(b)(1), unless the authorization is terminated  or revoked sooner.  Performed at [facility], [place] [address], [place], [place] [address]   MRSA PCR Screening     Status: None   Collection Time: 05/25/20 11:45 PM   Specimen: Nasal Mucosa; Nasopharyngeal  Result Value Ref Range Status   MRSA by PCR NEGATIVE NEGATIVE Final    Comment:        The GeneXpert MRSA Assay (FDA  approved for NASAL specimens only), is one component of a comprehensive MRSA colonization surveillance program. It is not intended to diagnose MRSA infection nor to guide or monitor treatment for MRSA infections. Performed at [facility], [place] [address] [place], [place] [address]   Pneumocystis smear by DFA     Status: None   Collection Time: 05/26/20  1:26 PM   Specimen: Bronchial Alveolar Lavage; Respiratory  Result Value Ref Range Status   Specimen Source-PJSRC BRONCHIAL ALVEOLAR LAVAGE  Final    Comment: RML   Pneumocystis jiroveci Ag NEGATIVE  Final    Comment: Performed at [place] Performed at [place] [address] [place], [place] [address]   Culture, respiratory     Status: None (Preliminary result)   Collection Time: 05/26/20  1:37 PM   Specimen: Bronchoalveolar Lavage; Respiratory  Result Value Ref Range Status   Specimen Description   Final    BRONCHIAL ALVEOLAR LAVAGE RML Performed at [place] [address], [place], [place] [address]    Special Requests   Final    NONE Performed at [facility], [place] [address] [place], [place] [address]    Gram Stain   Final    MODERATE WBC PRESENT, PREDOMINANTLY MONONUCLEAR NO ORGANISMS SEEN    Culture   Final    NO GROWTH 2 DAYS Performed at [facility] [address], [place], [place] [address]    Report Status PENDING  Incomplete  Culture, respiratory     Status: None (Preliminary result)   Collection Time: 05/26/20  1:54 PM   Specimen: Bronchoalveolar  Lavage; Respiratory  Result Value Ref Range Status   Specimen Description   Final    BRONCHIAL ALVEOLAR LAVAGE RUL Performed at [facility] [address], [place], [place] [address]    Special Requests   Final    NONE Performed at [facility], [place] [address] [place], [place] [address]    Gram Stain   Final    MODERATE WBC PRESENT, PREDOMINANTLY MONONUCLEAR NO ORGANISMS SEEN    Culture   Final    NO GROWTH 2 DAYS Performed at [place] [address], [place], [place] [address]    Report Status PENDING  Incomplete  Pneumocystis smear by DFA     Status: None   Collection Time: 05/26/20  1:54 PM   Specimen: Bronchial Alveolar Lavage; Respiratory  Result Value Ref Range Status   Specimen Source-PJSRC BRONCHIAL ALVEOLAR LAVAGE  Final    Comment: RUL   Pneumocystis jiroveci Ag NEGATIVE  Final    Comment: Performed at [facility] Performed at [facility], [place] [address] [place] [address] [place], [place] [address]   Respiratory Panel by PCR     Status: None   Collection Time: 05/26/20  6:19 PM   Specimen: Nasopharyngeal Swab; Respiratory  Result Value Ref Range Status   Adenovirus NOT DETECTED NOT DETECTED Final   Coronavirus 229E NOT DETECTED NOT DETECTED Final    Comment: (NOTE) The Coronavirus on the Respiratory Panel, DOES NOT test for the novel  Coronavirus (2019 nCoV)    Coronavirus HKU1 NOT DETECTED NOT DETECTED Final   Coronavirus NL63 NOT DETECTED NOT DETECTED Final   Coronavirus OC43 NOT DETECTED NOT DETECTED Final   Metapneumovirus NOT DETECTED NOT DETECTED Final   Rhinovirus / Enterovirus NOT DETECTED NOT DETECTED Final   Influenza A  NOT DETECTED NOT DETECTED Final   Influenza B NOT DETECTED NOT DETECTED Final   Parainfluenza Virus 1 NOT DETECTED NOT DETECTED Final   Parainfluenza Virus 2 NOT DETECTED NOT DETECTED Final   Parainfluenza Virus 3 NOT DETECTED NOT DETECTED Final   Parainfluenza  Virus 4 NOT DETECTED NOT DETECTED Final   Respiratory Syncytial Virus NOT DETECTED NOT DETECTED Final   Bordetella pertussis NOT DETECTED NOT DETECTED Final   Chlamydophila pneumoniae NOT DETECTED NOT DETECTED Final   Mycoplasma pneumoniae NOT DETECTED NOT DETECTED Final    Comment: Performed at [facility], [place] [address] [place], [place] [address]         Radiology Studies: DG CHEST PORT 1 VIEW  Result Date: 05/29/2020 CLINICAL DATA:  Acute respiratory failure with hypoxia. EXAM: PORTABLE CHEST 1 VIEW COMPARISON:  05/25/2020 FINDINGS: Right chest wall port a catheter tip is at the cavoatrial junction. Stable cardiomediastinal contours. Small to moderate right pleural effusion which appears partially loculated and extends over the lateral right upper lobe. Diffuse interstitial opacities are identified along with right upper lobe, right lower lobe and left lower lobe airspace disease. IMPRESSION: 1. Small to moderate loculated right pleural effusion Stable small to moderate right pleural effusion. 2. Diffuse interstitial opacities with bilateral multifocal airspace disease compatible with multifocal pneumonia. Electronically Signed   By: Kerby Moors M.D.   On: 05/29/2020 06:32        Scheduled Meds: . apixaban  2.5 mg Oral BID  . Chlorhexidine Gluconate Cloth  6 each Topical Daily  . docusate sodium  200 mg Oral Daily  . lactose free nutrition  237 mL Oral TID WC  . latanoprost  1 drop Both Eyes QHS  . methylPREDNISolone (SOLU-MEDROL) injection  60 mg Intravenous Q6H  . metoprolol tartrate  12.5 mg Oral BID  . multivitamin with minerals  1 tablet Oral Daily  . polyethylene glycol  17 g Oral Daily   Continuous Infusions: . ampicillin-sulbactam (UNASYN) IV 3 g (05/29/20 0543)     LOS: 3 days   Time spent: 20mns Greater than 50% of this time was spent in counseling, explanation of diagnosis, planning of further management, and coordination of care.  I have  personally reviewed and interpreted on  05/29/2020 daily labs, tele strips, imagings as discussed above under date review session and assessment and plans.  I reviewed all nursing notes, pharmacy notes, consultant notes,  vitals, pertinent old records  I have discussed plan of care as described above with RN , patient on 05/29/2020  Voice Recognition /Dragon dictation system was used to create this note, attempts have been made to correct errors. Please contact the author with questions and/or clarifications.   FFlorencia Reasons MD PhD FACP [facility]  Available via Epic secure chat 7am-7pm for nonurgent issues Please page for urgent issues To page the attending provider between 7A-7P or the covering provider during after hours 7P-7A, please log into the web site [email or web address] and access using universal [place] password for that web site. If you do not have the password, please call the hospital operator.    05/29/2020, 7:16 AM

## 2020-05-30 ENCOUNTER — Inpatient Hospital Stay (HOSPITAL_COMMUNITY): Payer: Medicare PPO

## 2020-05-30 LAB — BASIC METABOLIC PANEL
Anion gap: 11 (ref 5–15)
BUN: 32 mg/dL — ABNORMAL HIGH (ref 8–23)
CO2: 31 mmol/L (ref 22–32)
Calcium: 9.2 mg/dL (ref 8.9–10.3)
Chloride: 104 mmol/L (ref 98–111)
Creatinine, Ser: 0.66 mg/dL (ref 0.61–1.24)
GFR calc Af Amer: >60 mL/min (ref >60)
GFR calc non Af Amer: >60 mL/min (ref >60)
Glucose, Bld: 121 mg/dL — ABNORMAL HIGH (ref 70–99)
Potassium: 4.1 mmol/L (ref 3.5–5.1)
Sodium: 146 mmol/L — ABNORMAL HIGH (ref 135–145)

## 2020-05-30 LAB — CULTURE, BLOOD (SINGLE)
Culture: NO GROWTH
Culture: NO GROWTH
Special Requests: ADEQUATE

## 2020-05-30 LAB — MRSA PCR SCREENING: MRSA by PCR: NEGATIVE

## 2020-05-30 MED ORDER — ORAL CARE MOUTH RINSE
15.0000 mL | Freq: Two times a day (BID) | OROMUCOSAL | Status: DC
  Administered 2020-05-30 – 2020-06-04 (×9): 15 mL via OROMUCOSAL

## 2020-05-30 MED ORDER — CHLORHEXIDINE GLUCONATE 0.12 % MT SOLN
15.0000 mL | Freq: Two times a day (BID) | OROMUCOSAL | Status: DC
  Administered 2020-05-30 – 2020-06-06 (×13): 15 mL via OROMUCOSAL
  Filled 2020-05-30 (×14): qty 15

## 2020-05-30 MED ORDER — SODIUM CHLORIDE 0.9 % IV SOLN
2.0000 g | Freq: Three times a day (TID) | INTRAVENOUS | Status: AC
  Administered 2020-05-30 – 2020-06-01 (×8): 2 g via INTRAVENOUS
  Filled 2020-05-30 (×8): qty 2

## 2020-05-30 NOTE — Progress Notes (Signed)
Patients oxygen saturation dropped to 36%. Patient placed on a non-rebreather and MD notified. Patient o2 saturation is now 80% on nonrebreather. Transfer orders placed.

## 2020-05-30 NOTE — Progress Notes (Addendum)
PROGRESS NOTE    Peter Wood  XFG:[id number] DOB: 1934-10-14 DOA: 05/25/2020 PCP: Cassandria Anger, MD    Chief Complaint  Patient presents with  . Shortness of Breath  . Fever  . Leg Swelling  . Cough    Brief Narrative:  Peter Wood is a 85 y.o. male with HTN, PE 2020, gastric adenocarcinoma with mets to liver and bone Dx 09/2018 s/p chemotherapy with good response initially but slow progression more recently on Enhertu last received 04/28/2020 who was referred from oncology clinic for evaluation of persistent fever and dyspnea.   He reports recurrent fevers and dyspnea despite treatment. Symptoms started 3 to 4 weeks ago marked by significant dyspnea, cough productive of yellowish phlegm and recurrent fevers. He denies any wheezing, chest pain/tightness, leg swelling or orthopnea.  CXR 04/28/2020 with bilateral infiltrates however Covid PCR was negative. Despite negative Covid test and given high risk, he receivedmonoclonal antibody casirivimab/imdevimab 04/29/2020 followed by a course of azithromycin without significant improvement. He is referred to ED by oncology  CTA chest obtained in the ED without evidence of PE but extensive bilateral asymmetric airspace opacities.  Rule out atypical pneumonia.  Evidence of pulmonary hypertension.  Subjective:  Patients oxygen saturation dropped to 36% after use the toilet. Patient is placed on a non-rebreather o2 saturation is now 80%.  He reports feeling sob, denies pain, there is no confusion No fever, I did not hear any cough during encounter  Assessment & Plan:   Principal Problem:   Acute respiratory failure with hypoxia (HCC) Active Problems:   Essential hypertension   Pulmonary embolism (HCC)   Liver metastases (HCC)   Metastasis from malignant tumor of stomach (HCC)   Gastric cancer (HCC)   Iron deficiency anemia due to chronic blood loss   Multifocal pneumonia   Hypoalbuminemia   Abnormal LFTs   Chronic  diastolic CHF (congestive heart failure) (HCC)  Acute respiratory failure with hypoxia secondary to multifocal pneumonia versus lymphangitic spread of the tumor versus enhertu induced pneumonitis -He presented with fever 101.5, tachycardia, tachypnea, hypoxia, fever has resolved  -Blood culture no growth, SARS-CoV-2 screening negative, MRSA screening negative -CTA chest obtained in the ED without evidence of PE but extensive bilateral asymmetric airspace opacities. -S/ p bronchoscopy, bronchial lavage culture negative, pathology pending -Is started on Vanco and Unasyn in the ED, vancomycin discontinued after MRSA screening negative, he is continued on Unasyn --IV Solu-Medrol started by pulmonology to cover enhertu induced  Pneumonitis -He received Lasix 20x1 on September 16, another IV Lasix 20 x1 on 9/18 -worsening hypoxia, needing NRB, case discussed with pulmonary/critical care Dr Vaughan Browner who recommend transfer patient to stepdown, patient is full code ( confirmed with patient and wife), Appreciate pulmonology input, will follow recommendation.  Case discussed with Dr Vaughan Browner again, per Dr Vaughan Browner , patient now wants to be DNR after talking to him. Dr Vaughan Browner recommend Ct chest without contrast, broaden abx to cefepime, transfer to stepdown.  Chronic combined heart failure echocardiogram was completed on 03/29/2020 and showed an EF of 50-55% with grade 1 diastolic dysfunction He received Lasix 20x1 on September 16, another IV Lasix 20 mg on 9/18 Close monitor volume status  NSVTx1 on 9/16-17night, does not appear to have symptom - mag 2.3, gave potassium 40 mEq x 1 today to keep K greater than 4 - started on lopressor 12.5 mg twice a day   History of PE, diagnosed in January 2020 at the  Same time of gastric adenocarcinoma diagnosis  Was briefly on heparin drip in preparation for bronchoscopy , now back on eliquis  Hypertension bp low normal , d/c norvasc, d/c lotensin Started lopressor  12.5 mg twice daily monitor  metastatic gastric adenocarcinoma with liver mets -h/o Bleeding gastric ulcer in 09/2018 underwent EGD with biopsy showed metastatic gastric adenocarcinoma -He was started on Enhertu on 12/23/2019. He receives this every three weeks with mixed response , last dose on 8/18 -chronic elevated lft  Anemia of chronic disease in the setting of malignancy, could also have chronic blood loss from gastric cancer, no overt sign of bleeding Hgb 9, monitor hemoglobin  History of prostate cancer  FTT Body mass index is 23.17 kg/m.  Poor prognosis, will consult palliative care  DVT prophylaxis: apixaban (ELIQUIS) tablet 2.5 mg Start: 05/27/20 1200 apixaban (ELIQUIS) tablet 2.5 mg   Code Status: he said he wants to be on life support initially, then changed his mind , he does not want to be intubated, he wants to be DNR per Dr Vaughan Browner  Family Communication:  wife over the phone with permission Disposition:   Status is: Inpatient  Dispo: The patient is from: home              Anticipated d/c is to: TBD              Anticipated d/c date is: TBD              transfer to stepdown unit due to clinical deterioration   Consultants:   Pulmonology/critical care  Oncology   Procedures:   Bronchoscopy  Antimicrobials:    vancx1 in the ED Unasyn since admission    Objective: Vitals:   05/30/20 0159 05/30/20 0206 05/30/20 0216 05/30/20 0300  BP:  114/89    Pulse:  86    Resp: (!) 22 20    Temp:  97.9 F (36.6 C)    TempSrc:  Oral    SpO2: 90% 90% 98% 90%  Weight:      Height:        Intake/Output Summary (Last 24 hours) at 05/30/2020 0955 Last data filed at 05/30/2020 0200 Gross per 24 hour  Intake 440 ml  Output 551 ml  Net -111 ml   Filed Weights   05/25/20 1744 05/26/20 1234  Weight: 61.2 kg 61.2 kg    Examination:  General exam: frail, appear weak Respiratory system: diminished with mild crackles, no wheezing.   tachypneic, no accessory  muscle use Cardiovascular system: S1 & S2 heard, RRR. No JVD, no murmur, No pedal edema. Gastrointestinal system: Abdomen is nondistended, soft and nontender.  Normal bowel sounds heard. Central nervous system: Alert and orientedx3. No focal neurological deficits. Extremities: Generalized weakness Skin: No rashes, lesions or ulcers Psychiatry: Judgement and insight appear normal. Mood & affect appropriate.     Data Reviewed: I have personally reviewed following labs and imaging studies  CBC: Recent Labs  Lab 05/25/20 1552 05/26/20 0448 05/27/20 0304 05/28/20 0420  WBC 10.0 11.4* 15.2* 7.5  NEUTROABS 7.8*  --   --   --   HGB 10.4* 9.7* 9.4* 9.1*  HCT 31.3* 29.6* 28.0* 27.8*  MCV 101.6* 104.2* 102.9* 103.3*  PLT 318 305 303 924    Basic Metabolic Panel: Recent Labs  Lab 05/26/20 0448 05/27/20 0304 05/28/20 0420 05/29/20 0331 05/30/20 0338  NA 135 134* 142 141 146*  K 3.9 3.9 3.8 3.8 4.1  CL 100 98 101 102 104  CO2 _0 31  GLUCOSE 94 106* 153* 127* 121*  BUN 14 15 24* 31* 32*  CREATININE 0.71 0.75 0.72 0.68 0.66  CALCIUM 8.3* 8.0* 8.7* 8.8* 9.2  MG  --   --   --  2.3  --     GFR: Estimated Creatinine Clearance: 59.6 mL/min (by C-G formula based on SCr of 0.66 mg/dL).  Liver Function Tests: Recent Labs  Lab 05/25/20 1552 05/26/20 0448 05/27/20 0304 05/28/20 0420 05/29/20 0331  AST 116* 79* 52* 54* 119*  ALT 86* 63* 46* 43 71*  ALKPHOS 533* 386* 358* 374* 463*  BILITOT 1.1 0.9 1.5* 1.2 0.9  PROT 6.8 6.1* 5.6* 5.9* 5.8*  ALBUMIN 2.2* 2.2* 2.0* 1.8* 2.1*    CBG: No results for input(s): GLUCAP in the last 168 hours.   Recent Results (from the past 240 hour(s))  Culture, Blood     Status: None (Preliminary result)   Collection Time: 05/25/20  3:52 PM   Specimen: BLOOD  Result Value Ref Range Status   Specimen Description BLOOD LEFT ANTECUBITAL  Final   Special Requests   Final    BOTTLES DRAWN AEROBIC AND ANAEROBIC Blood Culture results may  not be optimal due to an excessive volume of blood received in culture bottles   Culture   Final    NO GROWTH 4 DAYS Performed at [facility], [place] [address], [place], [place] [address]    Report Status PENDING  Incomplete  Culture, Blood     Status: None (Preliminary result)   Collection Time: 05/25/20  4:48 PM   Specimen: BLOOD  Result Value Ref Range Status   Specimen Description BLOOD PORTA CATH  Final   Special Requests   Final    BOTTLES DRAWN AEROBIC AND ANAEROBIC Blood Culture adequate volume   Culture   Final    NO GROWTH 4 DAYS Performed at [facility], [address] [place], [place] [address]    Report Status PENDING  Incomplete  SARS Coronavirus 2 by RT PCR (hospital order, performed in [facility] hospital lab) Nasopharyngeal Nasopharyngeal Swab     Status: None   Collection Time: 05/25/20  6:05 PM   Specimen: Nasopharyngeal Swab  Result Value Ref Range Status   SARS Coronavirus 2 NEGATIVE NEGATIVE Final    Comment: (NOTE) SARS-CoV-2 target nucleic acids are NOT DETECTED.  The SARS-CoV-2 RNA is generally detectable in upper and lower respiratory specimens during the acute phase of infection. The lowest concentration of SARS-CoV-2 viral copies this assay can detect is 250 copies / mL. A negative result does not preclude SARS-CoV-2 infection and should not be used as the sole basis for treatment or other patient management decisions.  A negative result may occur with improper specimen collection / handling, submission of specimen other than nasopharyngeal swab, presence of viral mutation(s) within the areas targeted by this assay, and inadequate number of viral copies (<250 copies / mL). A negative result must be combined with clinical observations, patient history, and epidemiological information.  Fact Sheet for Patients:   StrictlyIdeas.no  Fact Sheet for Healthcare  Providers: BankingDealers.co.za  This test is not yet approved or  cleared by the [place] FDA and has been authorized for detection and/or diagnosis of SARS-CoV-2 by FDA under an Emergency Use Authorization (EUA).  This EUA will remain in effect (meaning this test can be used) for the duration of the COVID-19 declaration under Section 564(b)(1) of the Act, 21 U.S.C. section 360bbb-3(b)(1), unless the authorization is terminated  or revoked sooner.  Performed at [facility], [place] [address], [place], [place] [address]   MRSA PCR Screening     Status: None   Collection Time: 05/25/20 11:45 PM   Specimen: Nasal Mucosa; Nasopharyngeal  Result Value Ref Range Status   MRSA by PCR NEGATIVE NEGATIVE Final    Comment:        The GeneXpert MRSA Assay (FDA approved for NASAL specimens only), is one component of a comprehensive MRSA colonization surveillance program. It is not intended to diagnose MRSA infection nor to guide or monitor treatment for MRSA infections. Performed at [facility], [place] [address], [place], [place] [address]   Acid Fast Smear (AFB)     Status: None   Collection Time: 05/26/20  1:26 PM   Specimen: Bronchial Alveolar Lavage  Result Value Ref Range Status   AFB Specimen Processing Concentration  Final   Acid Fast Smear Negative  Final    Comment: (NOTE) Performed At: [facility] [place], [place] [id number] Rush Farmer MD [id number]    Source (AFB) BRONCHIAL ALVEOLAR LAVAGE  Final    Comment: RML Performed at [facility], [place] [address], [place], [place] [address]   Pneumocystis smear by DFA     Status: None   Collection Time: 05/26/20  1:26 PM   Specimen: Bronchial Alveolar Lavage; Respiratory  Result Value Ref Range Status   Specimen Source-PJSRC BRONCHIAL ALVEOLAR LAVAGE  Final    Comment: RML   Pneumocystis jiroveci Ag NEGATIVE  Final     Comment: Performed at [place] Performed at [place] [address], [place], [place] [address]   Culture, respiratory     Status: None   Collection Time: 05/26/20  1:37 PM   Specimen: Bronchoalveolar Lavage; Respiratory  Result Value Ref Range Status   Specimen Description   Final    BRONCHIAL ALVEOLAR LAVAGE RML Performed at [place] [address], [place], [place] [address]    Special Requests   Final    NONE Performed at [facility], [place] [address] [place], [place] [address]    Gram Stain   Final    MODERATE WBC PRESENT, PREDOMINANTLY MONONUCLEAR NO ORGANISMS SEEN    Culture   Final    NO GROWTH 2 DAYS Performed at [place] [address], [place], [place] [address]    Report Status 05/29/2020 FINAL  Final  Acid Fast Smear (AFB)     Status: None   Collection Time: 05/26/20  1:53 PM   Specimen: Bronchial Alveolar Lavage  Result Value Ref Range Status   AFB Specimen Processing Concentration  Final   Acid Fast Smear Negative  Final    Comment: (NOTE) Performed At: [facility] [place], [place] [id number] Rush Farmer MD [id number]    Source (AFB) BRONCHIAL ALVEOLAR LAVAGE  Final    Comment: RUL Performed at [facility], [place] [address], [place], [place] [address]   Culture, respiratory     Status: None   Collection Time: 05/26/20  1:54 PM   Specimen: Bronchoalveolar Lavage; Respiratory  Result Value Ref Range Status   Specimen Description   Final    BRONCHIAL ALVEOLAR LAVAGE RUL Performed at [place] [address], [place], [place] [address]    Special Requests   Final    NONE Performed at [facility],  [place] [address], [place], [place] [address]    Gram Stain   Final    MODERATE WBC PRESENT, PREDOMINANTLY MONONUCLEAR NO ORGANISMS SEEN    Culture   Final    NO GROWTH 2  DAYS Performed at [place] [address] [place], [place] [address]    Report Status 05/29/2020 FINAL  Final  Pneumocystis smear by DFA     Status: None   Collection Time: 05/26/20  1:54 PM   Specimen: Bronchial Alveolar Lavage; Respiratory  Result Value Ref Range Status   Specimen Source-PJSRC BRONCHIAL ALVEOLAR LAVAGE  Final    Comment: RUL   Pneumocystis jiroveci Ag NEGATIVE  Final    Comment: Performed at [facility] [facility] Performed at [facility], [place] [address], [place], [place] [address]   Respiratory Panel by PCR     Status: None   Collection Time: 05/26/20  6:19 PM   Specimen: Nasopharyngeal Swab; Respiratory  Result Value Ref Range Status   Adenovirus NOT DETECTED NOT DETECTED Final   Coronavirus 229E NOT DETECTED NOT DETECTED Final    Comment: (NOTE) The Coronavirus on the Respiratory Panel, DOES NOT test for the novel  Coronavirus (2019 nCoV)    Coronavirus HKU1 NOT DETECTED NOT DETECTED Final   Coronavirus NL63 NOT DETECTED NOT DETECTED Final   Coronavirus OC43 NOT DETECTED NOT DETECTED Final   Metapneumovirus NOT DETECTED NOT DETECTED Final   Rhinovirus / Enterovirus NOT DETECTED NOT DETECTED Final   Influenza A NOT DETECTED NOT DETECTED Final   Influenza B NOT DETECTED NOT DETECTED Final   Parainfluenza Virus 1 NOT DETECTED NOT DETECTED Final   Parainfluenza Virus 2 NOT DETECTED NOT DETECTED Final   Parainfluenza Virus 3 NOT DETECTED NOT DETECTED Final   Parainfluenza Virus 4 NOT DETECTED NOT DETECTED Final   Respiratory Syncytial Virus NOT DETECTED NOT DETECTED Final   Bordetella pertussis NOT DETECTED NOT DETECTED Final   Chlamydophila pneumoniae NOT DETECTED NOT DETECTED Final   Mycoplasma pneumoniae NOT DETECTED NOT DETECTED Final    Comment: Performed at [name], [place]. [address], [place], [place] [address]         Radiology Studies: DG CHEST PORT 1 VIEW  Result Date: 05/29/2020 CLINICAL DATA:   Acute respiratory failure with hypoxia. EXAM: PORTABLE CHEST 1 VIEW COMPARISON:  05/25/2020 FINDINGS: Right chest wall port a catheter tip is at the cavoatrial junction. Stable cardiomediastinal contours. Small to moderate right pleural effusion which appears partially loculated and extends over the lateral right upper lobe. Diffuse interstitial opacities are identified along with right upper lobe, right lower lobe and left lower lobe airspace disease. IMPRESSION: 1. Small to moderate loculated right pleural effusion Stable small to moderate right pleural effusion. 2. Diffuse interstitial opacities with bilateral multifocal airspace disease compatible with multifocal pneumonia. Electronically Signed   By: Kerby Moors M.D.   On: 05/29/2020 06:32        Scheduled Meds: . apixaban  2.5 mg Oral BID  . Chlorhexidine Gluconate Cloth  6 each Topical Daily  . docusate sodium  200 mg Oral Daily  . lactose free nutrition  237 mL Oral TID WC  . latanoprost  1 drop Both Eyes QHS  . methylPREDNISolone (SOLU-MEDROL) injection  60 mg Intravenous Q6H  . metoprolol tartrate  12.5 mg Oral BID  . multivitamin with minerals  1 tablet Oral Daily  . polyethylene glycol  17 g Oral Daily   Continuous Infusions: .  ampicillin-sulbactam (UNASYN) IV 3 g (05/30/20 0744)     LOS: 4 days   Time spent: 58mns Greater than 50% of this time was spent in counseling, explanation of diagnosis, planning of further management, and coordination of care.  I have personally reviewed and interpreted on  05/30/2020 daily labs, tele strips, imagings as discussed above under date review session and assessment and plans.  I reviewed all nursing notes, pharmacy notes, consultant notes,  vitals, pertinent old records  I have discussed plan of care as described above with RN , patient on 05/30/2020  Voice Recognition /Dragon dictation system was used to create this note, attempts have been made to correct errors. Please contact  the author with questions and/or clarifications.   FFlorencia Reasons MD PhD FACP [facility]  Available via Epic secure chat 7am-7pm for nonurgent issues Please page for urgent issues To page the attending provider between 7A-7P or the covering provider during after hours 7P-7A, please log into the web site [email or web address] and access using universal [place] password for that web site. If you do not have the password, please call the hospital operator.    05/30/2020, 9:55 AM

## 2020-05-30 NOTE — Progress Notes (Signed)
Peter Wood   DOB:1935/02/10   OA#:[id number]   SWF#:[id number]  Subjective:  Peter Wood is now on steroids; he feels shaky and says he cannot stand safely; he spent some tme on recliner yesterday and that was "all right." He gets SOB with rapid breathing with minimal activity-- this is keeping him from eating he says. No pain. Great BM this AM. No family in room    Objective: older African American man examined in bed Vitals:   05/30/20 0216 05/30/20 0300  BP:    Pulse:    Resp:    Temp:    SpO2: 98% 90%    Body mass index is 23.17 kg/m.  Intake/Output Summary (Last 24 hours) at 05/30/2020 1009 Last data filed at 05/30/2020 0200 Gross per 24 hour  Intake 440 ml  Output 551 ml  Net -111 ml    .  Lungs no rales or wheezes--auscultated anterolaterally  Heart regular rate and rhythm    CBG (last 3)  No results for input(s): GLUCAP in the last 72 hours.   Labs:  Lab Results  Component Value Date   WBC 7.5 05/28/2020   HGB 9.1 (L) 05/28/2020   HCT 27.8 (L) 05/28/2020   MCV 103.3 (H) 05/28/2020   PLT 329 05/28/2020   NEUTROABS 7.8 (H) 05/25/2020    _0 @  Urine Studies No results for input(s): UHGB, CRYS in the last 72 hours.  Invalid input(s): UACOL, UAPR, USPG, UPH, UTP, UGL, UKET, UBIL, UNIT, UROB, ULEU, UEPI, UWBC, URBC, UBAC, [place], [place], [place]  Basic Metabolic Panel: Recent Labs  Lab 05/26/20 0448 05/26/20 0448 05/27/20 0304 05/27/20 0304 05/28/20 0420 05/28/20 0420 05/29/20 0331 05/30/20 0338  NA 135  --  134*  --  142  --  141 146*  K 3.9   < > 3.9   < > 3.8   < > 3.8 4.1  CL 100  --  98  --  101  --  102 104  CO2 26  --  26  --  30  --  29 31  GLUCOSE 94  --  106*  --  153*  --  127* 121*  BUN 14  --  15  --  24*  --  31* 32*  CREATININE 0.71  --  0.75  --  0.72  --  0.68 0.66  CALCIUM 8.3*  --  8.0*  --  8.7*  --  8.8* 9.2  MG  --   --   --   --   --   --  2.3  --    < > = values in this interval not displayed.   GFR Estimated  Creatinine Clearance: 59.6 mL/min (by C-G formula based on SCr of 0.66 mg/dL). Liver Function Tests: Recent Labs  Lab 05/25/20 1552 05/26/20 0448 05/27/20 0304 05/28/20 0420 05/29/20 0331  AST 116* 79* 52* 54* 119*  ALT 86* 63* 46* 43 71*  ALKPHOS 533* 386* 358* 374* 463*  BILITOT 1.1 0.9 1.5* 1.2 0.9  PROT 6.8 6.1* 5.6* 5.9* 5.8*  ALBUMIN 2.2* 2.2* 2.0* 1.8* 2.1*   No results for input(s): LIPASE, AMYLASE in the last 168 hours. No results for input(s): AMMONIA in the last 168 hours. Coagulation profile Recent Labs  Lab 05/26/20 0448 05/27/20 0304 05/28/20 0420  INR 1.5* 1.6* 1.8*    CBC: Recent Labs  Lab 05/25/20 1552 05/26/20 0448 05/27/20 0304 05/28/20 0420  WBC 10.0 11.4* 15.2* 7.5  NEUTROABS 7.8*  --   --   --  HGB 10.4* 9.7* 9.4* 9.1*  HCT 31.3* 29.6* 28.0* 27.8*  MCV 101.6* 104.2* 102.9* 103.3*  PLT 318 305 303 329   Cardiac Enzymes: No results for input(s): CKTOTAL, CKMB, CKMBINDEX, TROPONINI in the last 168 hours. BNP: Invalid input(s): POCBNP CBG: No results for input(s): GLUCAP in the last 168 hours. D-Dimer No results for input(s): DDIMER in the last 72 hours. Hgb A1c No results for input(s): HGBA1C in the last 72 hours. Lipid Profile No results for input(s): CHOL, HDL, LDLCALC, TRIG, CHOLHDL, LDLDIRECT in the last 72 hours. Thyroid function studies No results for input(s): TSH, T4TOTAL, T3FREE, THYROIDAB in the last 72 hours.  Invalid input(s): FREET3 Anemia work up No results for input(s): VITAMINB12, FOLATE, FERRITIN, TIBC, IRON, RETICCTPCT in the last 72 hours. Microbiology Recent Results (from the past 240 hour(s))  Culture, Blood     Status: None (Preliminary result)   Collection Time: 05/25/20  3:52 PM   Specimen: BLOOD  Result Value Ref Range Status   Specimen Description BLOOD LEFT ANTECUBITAL  Final   Special Requests   Final    BOTTLES DRAWN AEROBIC AND ANAEROBIC Blood Culture results may not be optimal due to an excessive  volume of blood received in culture bottles   Culture   Final    NO GROWTH 4 DAYS Performed at [facility], [place] [address] [place], [place] [address]    Report Status PENDING  Incomplete  Culture, Blood     Status: None (Preliminary result)   Collection Time: 05/25/20  4:48 PM   Specimen: BLOOD  Result Value Ref Range Status   Specimen Description BLOOD PORTA CATH  Final   Special Requests   Final    BOTTLES DRAWN AEROBIC AND ANAEROBIC Blood Culture adequate volume   Culture   Final    NO GROWTH 4 DAYS Performed at [facility], [address], [place], [place] [address]    Report Status PENDING  Incomplete  SARS Coronavirus 2 by RT PCR (hospital order, performed in [facility] hospital lab) Nasopharyngeal Nasopharyngeal Swab     Status: None   Collection Time: 05/25/20  6:05 PM   Specimen: Nasopharyngeal Swab  Result Value Ref Range Status   SARS Coronavirus 2 NEGATIVE NEGATIVE Final    Comment: (NOTE) SARS-CoV-2 target nucleic acids are NOT DETECTED.  The SARS-CoV-2 RNA is generally detectable in upper and lower respiratory specimens during the acute phase of infection. The lowest concentration of SARS-CoV-2 viral copies this assay can detect is 250 copies / mL. A negative result does not preclude SARS-CoV-2 infection and should not be used as the sole basis for treatment or other patient management decisions.  A negative result may occur with improper specimen collection / handling, submission of specimen other than nasopharyngeal swab, presence of viral mutation(s) within the areas targeted by this assay, and inadequate number of viral copies (<250 copies / mL). A negative result must be combined with clinical observations, patient history, and epidemiological information.  Fact Sheet for Patients:   StrictlyIdeas.no  Fact Sheet for Healthcare Providers: BankingDealers.co.za  This test is not yet approved or   cleared by the [place] FDA and has been authorized for detection and/or diagnosis of SARS-CoV-2 by FDA under an Emergency Use Authorization (EUA).  This EUA will remain in effect (meaning this test can be used) for the duration of the COVID-19 declaration under Section 564(b)(1) of the Act, 21 U.S.C. section 360bbb-3(b)(1), unless the authorization is terminated or revoked  sooner.  Performed at [facility], [place] [address], [place], [place] [address]   MRSA PCR Screening     Status: None   Collection Time: 05/25/20 11:45 PM   Specimen: Nasal Mucosa; Nasopharyngeal  Result Value Ref Range Status   MRSA by PCR NEGATIVE NEGATIVE Final    Comment:        The GeneXpert MRSA Assay (FDA approved for NASAL specimens only), is one component of a comprehensive MRSA colonization surveillance program. It is not intended to diagnose MRSA infection nor to guide or monitor treatment for MRSA infections. Performed at [facility], [place] [address], [place], [place] [address]   Acid Fast Smear (AFB)     Status: None   Collection Time: 05/26/20  1:26 PM   Specimen: Bronchial Alveolar Lavage  Result Value Ref Range Status   AFB Specimen Processing Concentration  Final   Acid Fast Smear Negative  Final    Comment: (NOTE) Performed At: [facility] [place], [place] [id number] Rush Farmer MD OA:[id number]    Source (AFB) BRONCHIAL ALVEOLAR LAVAGE  Final    Comment: RML Performed at [facility], [place] [address] [place], [place] [address]   Pneumocystis smear by DFA     Status: None   Collection Time: 05/26/20  1:26 PM   Specimen: Bronchial Alveolar Lavage; Respiratory  Result Value Ref Range Status   Specimen Source-PJSRC BRONCHIAL ALVEOLAR LAVAGE  Final    Comment: RML   Pneumocystis jiroveci Ag NEGATIVE  Final    Comment: Performed at [place] Performed at [place] [address] [place], [place] [address]   Culture, respiratory     Status: None   Collection Time: 05/26/20  1:37 PM   Specimen: Bronchoalveolar Lavage; Respiratory  Result Value Ref Range Status   Specimen Description   Final    BRONCHIAL ALVEOLAR LAVAGE RML Performed at [place] [address] [place], [place] [address]    Special Requests   Final    NONE Performed at [facility], [place] [address], [place], [place] [address]    Gram Stain   Final    MODERATE WBC PRESENT, PREDOMINANTLY MONONUCLEAR NO ORGANISMS SEEN    Culture   Final    NO GROWTH 2 DAYS Performed at [place] [address] [place], [place] [address]    Report Status 05/29/2020 FINAL  Final  Acid Fast Smear (AFB)     Status: None   Collection Time: 05/26/20  1:53 PM   Specimen: Bronchial Alveolar Lavage  Result Value Ref Range Status   AFB Specimen Processing Concentration  Final   Acid Fast Smear Negative  Final    Comment: (NOTE) Performed At: [facility] [place], [place] [id number] Rush Farmer MD YW:[id number]    Source (AFB) BRONCHIAL ALVEOLAR LAVAGE  Final    Comment: RUL Performed at [facility], [place] [address] [place], [place] [address]   Culture, respiratory     Status: None   Collection Time: 05/26/20  1:54 PM   Specimen: Bronchoalveolar Lavage; Respiratory  Result Value Ref Range Status   Specimen Description   Final    BRONCHIAL ALVEOLAR LAVAGE RUL Performed at [place] [address] [place],  [address]    Special Requests   Final    NONE Performed at [facility], [place]  [address], [place], [place] [address]    Gram Stain   Final    MODERATE WBC PRESENT, PREDOMINANTLY MONONUCLEAR NO ORGANISMS SEEN    Culture   Final    NO GROWTH 2 DAYS Performed at [place] [address] [place], [place] [address]     Report Status 05/29/2020 FINAL  Final  Pneumocystis smear by DFA     Status: None   Collection Time: 05/26/20  1:54 PM   Specimen: Bronchial Alveolar Lavage; Respiratory  Result Value Ref Range Status   Specimen Source-PJSRC BRONCHIAL ALVEOLAR LAVAGE  Final    Comment: RUL   Pneumocystis jiroveci Ag NEGATIVE  Final    Comment: Performed at [facility] Performed at [facility], [place] [address], [place], [place] [address]   Respiratory Panel by PCR     Status: None   Collection Time: 05/26/20  6:19 PM   Specimen: Nasopharyngeal Swab; Respiratory  Result Value Ref Range Status   Adenovirus NOT DETECTED NOT DETECTED Final   Coronavirus 229E NOT DETECTED NOT DETECTED Final    Comment: (NOTE) The Coronavirus on the Respiratory Panel, DOES NOT test for the novel  Coronavirus (2019 nCoV)    Coronavirus HKU1 NOT DETECTED NOT DETECTED Final   Coronavirus NL63 NOT DETECTED NOT DETECTED Final   Coronavirus OC43 NOT DETECTED NOT DETECTED Final   Metapneumovirus NOT DETECTED NOT DETECTED Final   Rhinovirus / Enterovirus NOT DETECTED NOT DETECTED Final   Influenza A NOT DETECTED NOT DETECTED Final   Influenza B NOT DETECTED NOT DETECTED Final   Parainfluenza Virus 1 NOT DETECTED NOT DETECTED Final   Parainfluenza Virus 2 NOT DETECTED NOT DETECTED Final   Parainfluenza Virus 3 NOT DETECTED NOT DETECTED Final   Parainfluenza Virus 4 NOT DETECTED NOT DETECTED Final   Respiratory Syncytial Virus NOT DETECTED NOT DETECTED Final   Bordetella pertussis NOT DETECTED NOT DETECTED Final   Chlamydophila pneumoniae NOT DETECTED NOT DETECTED Final   Mycoplasma pneumoniae NOT DETECTED NOT DETECTED Final    Comment: Performed at [facility], [place]. [address], [place], [place] [address]      Studies:  DG CHEST PORT 1 VIEW  Result Date: 05/29/2020 CLINICAL DATA:  Acute respiratory failure with hypoxia. EXAM: PORTABLE CHEST 1 VIEW COMPARISON:  05/25/2020 FINDINGS:  Right chest wall port a catheter tip is at the cavoatrial junction. Stable cardiomediastinal contours. Small to moderate right pleural effusion which appears partially loculated and extends over the lateral right upper lobe. Diffuse interstitial opacities are identified along with right upper lobe, right lower lobe and left lower lobe airspace disease. IMPRESSION: 1. Small to moderate loculated right pleural effusion Stable small to moderate right pleural effusion. 2. Diffuse interstitial opacities with bilateral multifocal airspace disease compatible with multifocal pneumonia. Electronically Signed   By: Kerby Moors M.D.   On: 05/29/2020 06:32    Assessment: 85 y.o. [place], [place] man status post gastric biopsy 09/20/2018 showing adenocarcinoma, with a CA 19-9 greater than 65,000; staging studies showing an apparent mass adjacent to the head of the pancreas, innumerable liver lesions, upper abdominal mesenteric and peripancreatic lymphadenopathy, and an isolated sclerotic density in the left iliac bone             (a) genomics requested on gastric biopsy showed the tumor to be HER-2 amplified at 3+, PD-L1 positive, and T p53 mutated.  MSI is stable, mismatch repair status is proficient and the tumor mutational burden is  intermediate.  (1) chest CT scan 09/19/2018 shows right lower lobe pulmonary embolism             (a) on intravenous heparin started 09/19/2018, transitioned to apixaban 10/01/2018  (2) genetics testing 10/04/2018: negative             (a) family history of prostate and early breast cancer; patient has a history of prostate cancer  (3) started gemcitabine/Abraxane 10/09/2018, repeated days 1, 8 and 15 of each 28-day cycle             (a) stopped after 1 cycle (3 doses) with reassessment of diagnosis based on CARIS results, noted above  (4) started oxaliplatin, capecitabine and trastuzumab 11/06/2018             (a) baseline echocardiogram 09/20/2018 shows an ejection fraction  in the 55-60% range             (b) oxaliplatin and trastuzumab every 21 days started 11/06/2018             (c) capecitabine dose 1000 mg twice daily for 14 days of every 21-day cycle             (d) CT of the abdomen and pelvis 02/11/2019 shows marked improvement in his liver lesions and resolution of periportal adenopathy             (e) tumor markers also show significant response,             (f) echocardiogram 02/12/2019 shows an ejection fraction in the 60-65% range             (g) CT abd/pelvis show stable/ improved disease, tumor markers nearly normal             (h) oxaliplatin stopped aftwer 04/30/2019 dose             (i) capecitabine stopped after September cycle              (5) trastuzumab maintenance started 05/20/2019, last dose 07/22/2019, with progression  (6) disease progression documented by lab work and CT scan 07/16/2019             (a) resumed FOLFOX 07/29/2019, continuing trastuzumab (every 14 days).             (b) restaging studies after 3 cycles showed evidence of progression (tumor markers equivocal)             (c) FOLFOX discontinued 08/26/2019  (7) started T-DM1 09/30/2019, repeat every 21 days             (a) pembrolizumab added 12/02/2019             (b) echocardiogram 08/25/2019 showed an ejection fraction in the 60-65% range             (c) repeat CT of the abdomen 12/11/2019 shows growth in 2 liver lesions  (8) started [facility] 12/23/2019 at 5.4 mg/kg.             (a) dose increased to 6.1 mg/kg with the third dose and two 6.4 (target dose) 03/17/2020                  (b) Changed to every 4 weeks after receiving 04/28/2020 dose (which was his most recent)  (9) iron deficiency anemia:             (a) received Feraheme 04/22 /2021 and 01/08/2020      (10) pneumonia/ pneumonitis?  (a) patchy infiltrates  on CXR 04/28/2020   (i) COVID-19 negative   (ii) received antibodies as outpatient   (iii) s/p Z=pack  (b) worsening cough, SOB and fever   (i)  CT angio 05/25/2020 no PE, worsening bilateral infiltrates c/w atypical infection   (ii) vancomycin, unasyn started 05/25/2020; vanco d/c'd after one dose   (iii) methylprednisolone started 05/27/2020   (iv) cytology from bronchial fluid 05/26/2020 negative for malignancy   Plan:  Peter Wood is still very frail and SOB; some of his shakyness may be due to steroids. Hopefully lungs will start to clear and he can regain some of his prior functional status.  Greatly appreciate your help to this patient!  GM   Chauncey Cruel, MD 05/30/2020  10:09 AM Medical Oncology and Hematology [facility] [address] [place], [place] [address] Tel. [phone]    Fax. [phone]

## 2020-05-30 NOTE — Progress Notes (Signed)
PHARMACY NOTE -  Cefepime  Pharmacy has been assisting with dosing of cefepime for pneumonia.  Dosage remains stable at 2g IV q8 hr and need for further dosage adjustment appears unlikely at present given SCr at baseline  Pharmacy will sign off, following peripherally for culture results or dose adjustments. Please reconsult if a change in clinical status warrants re-evaluation of dosage.  Reuel Boom, PharmD, BCPS [phone] 05/30/2020, 10:46 AM

## 2020-05-30 NOTE — Progress Notes (Signed)
NAME:  Peter Wood, MRN:  [id number], DOB:  [birth year], LOS: 4 ADMISSION DATE:  05/25/2020, CONSULTATION DATE:  05/26/20 REFERRING MD: Dr. [place] Pink, CHIEF COMPLAINT:  SOB, Fever   Brief History   85 y/o M admitted 9/14 with reports of shortness of breath and fever.  He has a history of gastric adenocarcinoma with metastatic disease to liver and bone (initial diagnosis 09/2018) s/p chemotherapy with slow progression on Enhertu (last dose 04/28/20).   He had a CXR on 8/18 that demonstrated bilateral infiltrates.  COVID testing was negative at that time.  However, he was treated with monoclonal antibody (casirivimab / imdevimab) on 8/19 followed by a course of azithromycin without significant improvement.    PCCM consulted for evaluation.    Past Medical History  HTN PE - on eliquis  Prostate Cancer - s/p radiation treatment  Gastric adenocarcinoma with metastatic disease to liver and bone, initial dx 09/2018  Significant Hospital Events   9/14 Admit 9/15 PCCM consulted for pulmonary evaluation  9/16 Steroids initiated  9/19 Worsening hypoxia, transfer to SDU  Consults:  PCCM  Procedures:    Significant Diagnostic Tests:  CTA Chest 9/15 >> negative for PE, extensive bilateral asymmetric airspace opacities, small right pleural effusion, small simple appearing pericardial effusion without CT evidence of tamponade, central pulmonary arterial enlargement FOB 9/15 >> clear airways, minimal secretions, scant bloody tinged secretions in LLL  Micro Data:  COVID 9/14 >> negative  RVP 9/15 >> negative  BAL 9/15 >>  AFB 9/15 >>  PCP by DFA 9/15 >> negative   Antimicrobials:  Unasyn 9/15 >>   Interim history/subjective:   Had acute worsening of hypoxia while eating breakfast.  Denies any cough, choking on his food Now on nonrebreather with recovery of sats  Objective   Blood pressure 114/89, pulse 86, temperature 97.9 F (36.6 C), temperature source Oral, resp. rate 20, height 5'  4" (1.626 m), weight 61.2 kg, SpO2 90 %.        Intake/Output Summary (Last 24 hours) at 05/30/2020 1033 Last data filed at 05/30/2020 0200 Gross per 24 hour  Intake 440 ml  Output 551 ml  Net -111 ml   Filed Weights   05/25/20 1744 05/26/20 1234  Weight: 61.2 kg 61.2 kg    Examination: Blood pressure 114/89, pulse 86, temperature 97.9 F (36.6 C), temperature source Oral, resp. rate 20, height _0  (1.626 m), weight 61.2 kg, SpO2 90 %. Gen:      Mild distress, frail, elderly HEENT:  EOMI, sclera anicteric Neck:     No masses; no thyromegaly Lungs:    Clear to auscultation bilaterally; normal respiratory effort CV:         Regular rate and rhythm; no murmurs Abd:      + bowel sounds; soft, non-tender; no palpable masses, no distension Ext:    No edema; adequate peripheral perfusion Skin:      Warm and dry; no rash Neuro: Awake, oriented x3  Resolved Hospital Problem list      Assessment & Plan:    Acute Hypoxic Respiratory Failure in setting of Diffuse Bilateral Infiltrates  Fever  Hx Pulmonary Embolism on Eliquis  Recent hx of negative COVID testing, however treated with monoclonal antibodies on 8/19 & azithromycin.  Differential dx includes viral / bacterial infection, component pulmonary edema with noted central pulmonary arterial enlargement, BOOP/COP, pneumonitis / ILD associated with trastuzumab and lymphangitic spread of malignancy. CT angio negative for PE and he is on chronic anticoagulation  for prior PE. Minimal secretions on bronchoscopy. Suspect most likely dx is pneumonitis vs malignant spread.   Continue Eliquis Solu-Medrol at current dose Bronchoscopy results negative to date Unfortunately has taken a turn for the worse We will transfer to stepdown unit, supplemental oxygen via nonrebreather Get CT chest for reevaluation Change Unasyn to cefepime for broader coverage.  Goals of care Discussed with patient who has clearly stated to me that he would not  want intubation or CPR CODE STATUS changed to DNR I also called his wife and updated her regarding my conversation.  Discussed with Dr. Thornell Mule practice:  Diet: As tolerated, per primary  Pain/Anxiety/Delirium protocol (if indicated): n/a  VAP protocol (if indicated): n/a  DVT prophylaxis: heparin gtt  GI prophylaxis: per primary  Glucose control: per primary  Mobility: as tolerated  Code Status: DNR Family Communication: See above Disposition: Per primary   Labs   CBC: Recent Labs  Lab 05/25/20 1552 05/26/20 0448 05/27/20 0304 05/28/20 0420  WBC 10.0 11.4* 15.2* 7.5  NEUTROABS 7.8*  --   --   --   HGB 10.4* 9.7* 9.4* 9.1*  HCT 31.3* 29.6* 28.0* 27.8*  MCV 101.6* 104.2* 102.9* 103.3*  PLT 318 305 303 063    Basic Metabolic Panel: Recent Labs  Lab 05/26/20 0448 05/27/20 0304 05/28/20 0420 05/29/20 0331 05/30/20 0338  NA 135 134* 142 141 146*  K 3.9 3.9 3.8 3.8 4.1  CL 100 98 101 102 104  CO2 _0 GLUCOSE 94 106* 153* 127* 121*  BUN 14 15 24* 31* 32*  CREATININE 0.71 0.75 0.72 0.68 0.66  CALCIUM 8.3* 8.0* 8.7* 8.8* 9.2  MG  --   --   --  2.3  --    GFR: Estimated Creatinine Clearance: 59.6 mL/min (by C-G formula based on SCr of 0.66 mg/dL). Recent Labs  Lab 05/25/20 1552 05/25/20 1805 05/25/20 2130 05/26/20 0448 05/27/20 0304 05/27/20 0305 05/28/20 0420  PROCALCITON  --  1.96  --   --   --  11.09  --   WBC 10.0  --   --  11.4* 15.2*  --  7.5  LATICACIDVEN  --  1.4 1.3  --   --   --   --     Liver Function Tests: Recent Labs  Lab 05/25/20 1552 05/26/20 0448 05/27/20 0304 05/28/20 0420 05/29/20 0331  AST 116* 79* 52* 54* 119*  ALT 86* 63* 46* 43 71*  ALKPHOS 533* 386* 358* 374* 463*  BILITOT 1.1 0.9 1.5* 1.2 0.9  PROT 6.8 6.1* 5.6* 5.9* 5.8*  ALBUMIN 2.2* 2.2* 2.0* 1.8* 2.1*   No results for input(s): LIPASE, AMYLASE in the last 168 hours. No results for input(s): AMMONIA in the last 168 hours.  ABG No results found for:  PHART, PCO2ART, PO2ART, HCO3, TCO2, ACIDBASEDEF, O2SAT   Coagulation Profile: Recent Labs  Lab 05/26/20 0448 05/27/20 0304 05/28/20 0420  INR 1.5* 1.6* 1.8*    Cardiac Enzymes: No results for input(s): CKTOTAL, CKMB, CKMBINDEX, TROPONINI in the last 168 hours.  HbA1C: No results found for: HGBA1C  CBG: No results for input(s): GLUCAP in the last 168 hours.     Critical care time: n/a     Marshell Garfinkel MD [facility] Please see [email or web address] for pager details.  05/30/2020, 10:33 AM

## 2020-05-30 NOTE — Progress Notes (Signed)
Patient up to [facility], had BM. When getting up oxygen sat dropped to 58 and when getting back to bed HR got up to 140 per tele. Placed pt on non rebreather to recover.

## 2020-05-30 NOTE — Progress Notes (Signed)
RT called due to pt's drop in SATs. MD at bedside. Pt SATs maintaining in 70s and was on a simple mask at 15L. Pt switched to a NRB flush on flow meter and SATs increased to 100%. WOB within normal limits.

## 2020-05-31 DIAGNOSIS — Z7189 Other specified counseling: Secondary | ICD-10-CM

## 2020-05-31 DIAGNOSIS — Z515 Encounter for palliative care: Secondary | ICD-10-CM

## 2020-05-31 LAB — BASIC METABOLIC PANEL
Anion gap: 9 (ref 5–15)
BUN: 29 mg/dL — ABNORMAL HIGH (ref 8–23)
CO2: 31 mmol/L (ref 22–32)
Calcium: 8.9 mg/dL (ref 8.9–10.3)
Chloride: 104 mmol/L (ref 98–111)
Creatinine, Ser: 0.62 mg/dL (ref 0.61–1.24)
GFR calc Af Amer: >60 mL/min (ref >60)
GFR calc non Af Amer: >60 mL/min (ref >60)
Glucose, Bld: 110 mg/dL — ABNORMAL HIGH (ref 70–99)
Potassium: 3.9 mmol/L (ref 3.5–5.1)
Sodium: 144 mmol/L (ref 135–145)

## 2020-05-31 LAB — HEPATIC FUNCTION PANEL
ALT: 83 U/L — ABNORMAL HIGH (ref 0–44)
AST: 84 U/L — ABNORMAL HIGH (ref 15–41)
Albumin: 2.1 g/dL — ABNORMAL LOW (ref 3.5–5.0)
Alkaline Phosphatase: 412 U/L — ABNORMAL HIGH (ref 38–126)
Bilirubin, Direct: 0.3 mg/dL — ABNORMAL HIGH (ref 0.0–0.2)
Indirect Bilirubin: 1 mg/dL — ABNORMAL HIGH (ref 0.3–0.9)
Total Bilirubin: 1.3 mg/dL — ABNORMAL HIGH (ref 0.3–1.2)
Total Protein: 5.6 g/dL — ABNORMAL LOW (ref 6.5–8.1)

## 2020-05-31 LAB — CBC
HCT: 28.5 % — ABNORMAL LOW (ref 39.0–52.0)
Hemoglobin: 8.9 g/dL — ABNORMAL LOW (ref 13.0–17.0)
MCH: 33.1 pg (ref 26.0–34.0)
MCHC: 31.2 g/dL (ref 30.0–36.0)
MCV: 105.9 fL — ABNORMAL HIGH (ref 80.0–100.0)
Platelets: 297 10*3/uL (ref 150–400)
RBC: 2.69 MIL/uL — ABNORMAL LOW (ref 4.22–5.81)
RDW: 15.9 % — ABNORMAL HIGH (ref 11.5–15.5)
WBC: 11.7 10*3/uL — ABNORMAL HIGH (ref 4.0–10.5)
nRBC: 0.3 % — ABNORMAL HIGH (ref 0.0–0.2)

## 2020-05-31 MED ORDER — HYDRALAZINE HCL 20 MG/ML IJ SOLN
5.0000 mg | Freq: Three times a day (TID) | INTRAMUSCULAR | Status: DC | PRN
  Administered 2020-05-31 (×2): 5 mg via INTRAVENOUS
  Filled 2020-05-31 (×2): qty 1

## 2020-05-31 MED ORDER — AMLODIPINE BESYLATE 5 MG PO TABS
2.5000 mg | ORAL_TABLET | Freq: Every day | ORAL | Status: DC
  Administered 2020-05-31 – 2020-06-06 (×7): 2.5 mg via ORAL
  Filled 2020-05-31 (×7): qty 1

## 2020-05-31 MED ORDER — MORPHINE SULFATE 10 MG/5ML PO SOLN: 2.5000 mg | ORAL | Status: DC | PRN

## 2020-05-31 MED ORDER — POTASSIUM CHLORIDE CRYS ER 20 MEQ PO TBCR
20.0000 meq | EXTENDED_RELEASE_TABLET | Freq: Once | ORAL | Status: AC
  Administered 2020-05-31: 20 meq via ORAL
  Filled 2020-05-31: qty 1

## 2020-05-31 MED ORDER — FUROSEMIDE 10 MG/ML IJ SOLN
40.0000 mg | Freq: Once | INTRAMUSCULAR | Status: AC
  Administered 2020-05-31: 40 mg via INTRAVENOUS
  Filled 2020-05-31: qty 4

## 2020-05-31 NOTE — TOC Initial Note (Signed)
Transition of Care [facility]) - Initial/Assessment Note    Patient Details  Name: Peter Wood MRN: [id number] Date of Birth: 16-Oct-1934  Transition of Care [facility]) CM/SW Contact:    Leeroy Cha, RN Phone Number: 05/31/2020, 11:38 AM  Clinical Narrative:                 85 y/o M admitted 9/14 with reports of shortness of breath and fever.  He has a history of gastric adenocarcinoma with metastatic disease to liver and bone (initial diagnosis 09/2018) s/p chemotherapy with slow progression on Enhertu (last dose 04/28/20).   He had a CXR on 8/18 that demonstrated bilateral infiltrates.  COVID testing was negative at that time.  However, he was treated with monoclonal antibody (casirivimab / imdevimab) on 8/19 followed by a course of azithromycin without significant improvement.   hfnc at 15l/min, palliative care seeing, dx-pnuemonitis.  plan-following for progression and toc need Plan-home     Patient Goals and CMS Choice        Expected Discharge Plan and Services                                                Prior Living Arrangements/Services                       Activities of Daily Living Home Assistive Devices/Equipment: Eyeglasses ADL Screening (condition at time of admission) Patient's cognitive ability adequate to safely complete daily activities?: Yes Is the patient deaf or have difficulty hearing?: Yes Does the patient have difficulty seeing, even when wearing glasses/contacts?: No Does the patient have difficulty concentrating, remembering, or making decisions?: No Patient able to express need for assistance with ADLs?: Yes Does the patient have difficulty dressing or bathing?: Yes Independently performs ADLs?: No Communication: Independent Dressing (OT): Needs assistance Is this a change from baseline?: Change from baseline, expected to last >3 days Grooming: Needs assistance Is this a change from baseline?: Change from baseline, expected  to last >3 days Feeding: Needs assistance Is this a change from baseline?: Change from baseline, expected to last >3 days Bathing: Needs assistance Is this a change from baseline?: Change from baseline, expected to last >3 days Toileting: Needs assistance Is this a change from baseline?: Change from baseline, expected to last >3days In/Out Bed: Needs assistance Is this a change from baseline?: Change from baseline, expected to last >3 days Walks in Home: Needs assistance Is this a change from baseline?: Change from baseline, expected to last >3 days Does the patient have difficulty walking or climbing stairs?: Yes (secondary to weakness and cough) Weakness of Legs: Both Weakness of Arms/Hands: Both  Permission Sought/Granted                  Emotional Assessment              Admission diagnosis:  Multifocal pneumonia [J18.9] Patient Active Problem List   Diagnosis Date Noted  . Acute respiratory failure with hypoxia ([place]) 05/27/2020  . Chronic diastolic CHF (congestive heart failure) ([place]) 05/27/2020  . Multifocal pneumonia 05/26/2020  . Hypoalbuminemia 05/26/2020  . Abnormal LFTs 05/26/2020  . Iron deficiency anemia due to chronic blood loss 12/26/2019  . Port-A-Cath in place 12/18/2018  . Anemia 11/12/2018  . Anemia due to antineoplastic chemotherapy 11/06/2018  . Genetic testing 10/14/2018  .  Metastasis from malignant tumor of stomach ([place]) 10/04/2018  . Gastric cancer ([place]) 10/04/2018  . Family history of prostate cancer   . Family history of breast cancer   . Aortic atherosclerosis ([place]) 10/03/2018  . Acute pulmonary embolus ([place]) 09/20/2018  . Pulmonary embolism ([place]) 09/19/2018  . Liver metastases ([place]) 09/19/2018  . Abdominal neoplasm without bowel obstruction 09/19/2018  . Nausea 09/19/2018  . Abdominal pain 09/19/2018  . Upper respiratory infection 08/01/2018  . Varicose vein of leg 07/22/2018  . Weight loss 01/18/2018  . Claudication ([place])  01/16/2017  . Macular hole, right 09/19/2016  . Macular hole, right eye 09/19/2016  . Right sciatic nerve pain 07/19/2016  . Finger mass, right 06/24/2015  . Prostate cancer ([place]) 09/16/2013  . Rash and nonspecific skin eruption 02/12/2013  . Goals of care, counseling/discussion 09/01/2011  . TOBACCO USE, QUIT 08/25/2009  . ERECTILE DYSFUNCTION 03/03/2009  . PSA, INCREASED 03/03/2009  . Essential hypertension 06/10/2007  . DIVERTICULOSIS, COLON 06/10/2007  . COLONIC POLYPS, HX OF 06/10/2007   PCP:  Plotnikov, Evie Lacks, MD Pharmacy:   [facility] [address] [place] [address] [place] [address] Phone: [phone] Fax: [place], [place] - [address] [place] [address] Phone: [phone] Fax: [place], [place] - [place] [address] Phone: [phone] Fax: [phone]     Social Determinants of Health (SDOH) Interventions    Readmission Risk Interventions No flowsheet data found.

## 2020-05-31 NOTE — Progress Notes (Signed)
PROGRESS NOTE    Peter Wood  IPJ:[id number]  DOB: Feb 15, 1935  PCP: Cassandria Anger, MD Admit date:05/25/2020 Chief compliant: Shortness of breath and fever 85 y.o.malewithHTN, PE 2020, gastric adenocarcinomawith mets to liver and boneDx 1/2020s/p chemotherapy with good response initially but slow progression (more recently onEnhertulast received 04/28/2020), wasreferred from oncology clinic for evaluation of persistent productive cough with yellow phlegm, fever and dyspnea. CXR 04/28/2020 with bilateral infiltrates however Covid PCR was negative.Despite negative Covid test, given high risk, he received monoclonal antibody casirivimab/imdevimab8/19/2021 followed by a course of azithromycin without significant improvement. ED Course: T-max 101.5 per patient report, 133/76, pulse 101, RR 20-27, SPO2 88-96% room air.  Labs showed sodium 133, [name] 533, ALT 86, AST 116, WBC 10 K, hemoglobin 10.4, BNP 54, Covid PCR negative.  Chest x-ray showed persistent multifocal airspace opacities with differentials including atypical pneumonia versus lymphangitic spread of tumor.  CTA chest obtained in the ED without evidence of PE but extensive bilateral asymmetric airspace opacities and evidence of pulmonary hypertension.  Received IV vancomycin, Unasyn in the ED. Hospital course: Patient admitted to [facility] for further evaluation and management with supplemental O2, pulmonary consultation for possible bronchoscopy.  Eliquis held in anticipation of procedure.  Elevated LFTs felt to be secondary to metastatic disease/cholestasis. IV Solu-Medrol started by pulmonology to cover enhertu induced  Pneumonitis.  Patient underwent bronc on 9/15 which showed changes consistent with lung injury due to smoking.  On 9/19 patient noted to be severely hypoxic after using the bathroom with O2 sat dropped to 36%.  Patient placed on NRB M with O2 sat picking up to 80%.  PCCM recommended transfer to stepdown unit.  CODE  STATUS changed to DNR.  Palliative care consulted.  Subjective:  Patient currently in SDU and on high flow O2.  Palliative care NP at bedside discussing with patient.  He appears somewhat dyspneic on talking full sentences.  Subjectively feels the same as yesterday.  Also has NRB M which she has been using as needed additionally.  Blood pressure elevated with systolic 734L to 937T.  Objective: Vitals:   05/31/20 0800 05/31/20 0900 05/31/20 1000 05/31/20 1100  BP: (!) 172/96 (!) 166/100 (!) 146/89 134/85  Pulse:    92  Resp: (!) 27 (!) 28 (!) 26 (!) 26  Temp: 97.7 F (36.5 C)     TempSrc: Oral     SpO2: 92% 90% 91% 91%  Weight:      Height:        Intake/Output Summary (Last 24 hours) at 05/31/2020 1203 Last data filed at 05/31/2020 0800 Gross per 24 hour  Intake 359.82 ml  Output 1700 ml  Net -1340.18 ml   Filed Weights   05/25/20 1744 05/26/20 1234  Weight: 61.2 kg 61.2 kg    Physical Examination:  General: Thin built, appears somewhat dyspneic on talking full sentences Head ENT: Atraumatic normocephalic, PERRLA, neck supple Heart: S1-S2 heard, regular rate and rhythm, no murmurs.  No leg edema noted Lungs: Equal air entry bilaterally, no rhonchi or rales on exam, no accessory muscle use at rest but somewhat dyspneic on talking full sentences Abdomen: Bowel sounds heard, soft, nontender, nondistended. No organomegaly.  No CVA tenderness Extremities: No pedal edema.  No cyanosis or clubbing. Neurological: Awake alert oriented x3, no focal weakness or numbness, strength and sensations to crude touch intact Skin: No wounds or rashes.    Data Reviewed: I have personally reviewed following labs and imaging studies  CBC: Recent Labs  Lab 05/25/20 1552 05/26/20 0448 05/27/20 0304 05/28/20 0420 05/31/20 0429  WBC 10.0 11.4* 15.2* 7.5 11.7*  NEUTROABS 7.8*  --   --   --   --   HGB 10.4* 9.7* 9.4* 9.1* 8.9*  HCT 31.3* 29.6* 28.0* 27.8* 28.5*  MCV 101.6* 104.2* 102.9*  103.3* 105.9*  PLT 318 305 303 329 917   Basic Metabolic Panel: Recent Labs  Lab 05/27/20 0304 05/28/20 0420 05/29/20 0331 05/30/20 0338 05/31/20 0429  NA 134* 142 141 146* 144  K 3.9 3.8 3.8 4.1 3.9  CL 98 101 102 104 104  CO2 _0 GLUCOSE 106* 153* 127* 121* 110*  BUN 15 24* 31* 32* 29*  CREATININE 0.75 0.72 0.68 0.66 0.62  CALCIUM 8.0* 8.7* 8.8* 9.2 8.9  MG  --   --  2.3  --   --    GFR: Estimated Creatinine Clearance: 59.6 mL/min (by C-G formula based on SCr of 0.62 mg/dL). Liver Function Tests: Recent Labs  Lab 05/26/20 0448 05/27/20 0304 05/28/20 0420 05/29/20 0331 05/31/20 0429  AST 79* 52* 54* 119* 84*  ALT 63* 46* 43 71* 83*  ALKPHOS 386* 358* 374* 463* 412*  BILITOT 0.9 1.5* 1.2 0.9 1.3*  PROT 6.1* 5.6* 5.9* 5.8* 5.6*  ALBUMIN 2.2* 2.0* 1.8* 2.1* 2.1*   No results for input(s): LIPASE, AMYLASE in the last 168 hours. No results for input(s): AMMONIA in the last 168 hours. Coagulation Profile: Recent Labs  Lab 05/26/20 0448 05/27/20 0304 05/28/20 0420  INR 1.5* 1.6* 1.8*   Cardiac Enzymes: No results for input(s): CKTOTAL, CKMB, CKMBINDEX, TROPONINI in the last 168 hours. BNP (last 3 results) No results for input(s): PROBNP in the last 8760 hours. HbA1C: No results for input(s): HGBA1C in the last 72 hours. CBG: No results for input(s): GLUCAP in the last 168 hours. Lipid Profile: No results for input(s): CHOL, HDL, LDLCALC, TRIG, CHOLHDL, LDLDIRECT in the last 72 hours. Thyroid Function Tests: No results for input(s): TSH, T4TOTAL, FREET4, T3FREE, THYROIDAB in the last 72 hours. Anemia Panel: No results for input(s): VITAMINB12, FOLATE, FERRITIN, TIBC, IRON, RETICCTPCT in the last 72 hours. Sepsis Labs: Recent Labs  Lab 05/25/20 1805 05/25/20 2130 05/27/20 0305  PROCALCITON 1.96  --  11.09  LATICACIDVEN 1.4 1.3  --     Recent Results (from the past 240 hour(s))  Culture, Blood     Status: None   Collection Time: 05/25/20   3:52 PM   Specimen: BLOOD  Result Value Ref Range Status   Specimen Description BLOOD LEFT ANTECUBITAL  Final   Special Requests   Final    BOTTLES DRAWN AEROBIC AND ANAEROBIC Blood Culture results may not be optimal due to an excessive volume of blood received in culture bottles   Culture   Final    NO GROWTH 5 DAYS Performed at [facility], [place] [address] [place], [place] [address]    Report Status 05/30/2020 FINAL  Final  Culture, Blood     Status: None   Collection Time: 05/25/20  4:48 PM   Specimen: BLOOD  Result Value Ref Range Status   Specimen Description BLOOD PORTA CATH  Final   Special Requests   Final    BOTTLES DRAWN AEROBIC AND ANAEROBIC Blood Culture adequate volume   Culture   Final    NO GROWTH 5 DAYS Performed at [facility], [place] [address], [place], [place] [address]    Report Status 05/30/2020  FINAL  Final  SARS Coronavirus 2 by RT PCR (hospital order, performed in [facility] hospital lab) Nasopharyngeal Nasopharyngeal Swab     Status: None   Collection Time: 05/25/20  6:05 PM   Specimen: Nasopharyngeal Swab  Result Value Ref Range Status   SARS Coronavirus 2 NEGATIVE NEGATIVE Final    Comment: (NOTE) SARS-CoV-2 target nucleic acids are NOT DETECTED.  The SARS-CoV-2 RNA is generally detectable in upper and lower respiratory specimens during the acute phase of infection. The lowest concentration of SARS-CoV-2 viral copies this assay can detect is 250 copies / mL. A negative result does not preclude SARS-CoV-2 infection and should not be used as the sole basis for treatment or other patient management decisions.  A negative result may occur with improper specimen collection / handling, submission of specimen other than nasopharyngeal swab, presence of viral mutation(s) within the areas targeted by this assay, and inadequate number of viral copies (<250 copies / mL). A negative result must be combined with clinical observations, patient  history, and epidemiological information.  Fact Sheet for Patients:   StrictlyIdeas.no  Fact Sheet for Healthcare Providers: BankingDealers.co.za  This test is not yet approved or  cleared by the [place] FDA and has been authorized for detection and/or diagnosis of SARS-CoV-2 by FDA under an Emergency Use Authorization (EUA).  This EUA will remain in effect (meaning this test can be used) for the duration of the COVID-19 declaration under Section 564(b)(1) of the Act, 21 U.S.C. section 360bbb-3(b)(1), unless the authorization is terminated or revoked sooner.  Performed at [facility], [place] [address], [place], [place] [address]   MRSA PCR Screening     Status: None   Collection Time: 05/25/20 11:45 PM   Specimen: Nasal Mucosa; Nasopharyngeal  Result Value Ref Range Status   MRSA by PCR NEGATIVE NEGATIVE Final    Comment:        The GeneXpert MRSA Assay (FDA approved for NASAL specimens only), is one component of a comprehensive MRSA colonization surveillance program. It is not intended to diagnose MRSA infection nor to guide or monitor treatment for MRSA infections. Performed at [facility], [place] [address] [place], [place] [address]   Acid Fast Smear (AFB)     Status: None   Collection Time: 05/26/20  1:26 PM   Specimen: Bronchial Alveolar Lavage  Result Value Ref Range Status   AFB Specimen Processing Concentration  Final   Acid Fast Smear Negative  Final    Comment: (NOTE) Performed At: [facility] [place], [place] [id number] Rush Farmer MD [id number]    Source (AFB) BRONCHIAL ALVEOLAR LAVAGE  Final    Comment: RML Performed at [facility], [place] [address] [place], [place] [address]   Pneumocystis smear by DFA     Status: None   Collection Time: 05/26/20  1:26 PM   Specimen: Bronchial Alveolar Lavage; Respiratory  Result  Value Ref Range Status   Specimen Source-PJSRC BRONCHIAL ALVEOLAR LAVAGE  Final    Comment: RML   Pneumocystis jiroveci Ag NEGATIVE  Final    Comment: Performed at [facility] Performed at [place] [address] [place], [place] [address]   Culture, respiratory     Status: None   Collection Time: 05/26/20  1:37 PM   Specimen: Bronchoalveolar Lavage; Respiratory  Result Value Ref Range Status   Specimen Description   Final    BRONCHIAL ALVEOLAR LAVAGE RML  Performed at [facility], [place] [address], [place], [place] [address]    Special Requests   Final    NONE Performed at [facility], [place] [address], [place], [place] [address]    Gram Stain   Final    MODERATE WBC PRESENT, PREDOMINANTLY MONONUCLEAR NO ORGANISMS SEEN    Culture   Final    NO GROWTH 2 DAYS Performed at [place] [address] [place], [place] [address]    Report Status 05/29/2020 FINAL  Final  Acid Fast Smear (AFB)     Status: None   Collection Time: 05/26/20  1:53 PM   Specimen: Bronchial Alveolar Lavage  Result Value Ref Range Status   AFB Specimen Processing Concentration  Final   Acid Fast Smear Negative  Final    Comment: (NOTE) Performed At: [facility] [place], [place] [id number] Rush Farmer MD TJ:[id number]    Source (AFB) BRONCHIAL ALVEOLAR LAVAGE  Final    Comment: RUL Performed at [facility], [place] [address] [place], [place] [address]   Culture, respiratory     Status: None   Collection Time: 05/26/20  1:54 PM   Specimen: Bronchoalveolar Lavage; Respiratory  Result Value Ref Range Status   Specimen Description   Final    BRONCHIAL ALVEOLAR LAVAGE RUL Performed at [place] [address] [place], [place] [address]    Special Requests   Final    NONE Performed at [facility], [place] [address], [place],  [place] [address]    Gram Stain   Final    MODERATE WBC PRESENT, PREDOMINANTLY MONONUCLEAR NO ORGANISMS SEEN    Culture   Final    NO GROWTH 2 DAYS Performed at [place] [address], [place], [place] [address]    Report Status 05/29/2020 FINAL  Final  Pneumocystis smear by DFA     Status: None   Collection Time: 05/26/20  1:54 PM   Specimen: Bronchial Alveolar Lavage; Respiratory  Result Value Ref Range Status   Specimen Source-PJSRC BRONCHIAL ALVEOLAR LAVAGE  Final    Comment: RUL   Pneumocystis jiroveci Ag NEGATIVE  Final    Comment: Performed at [place] Performed at [facility], [place] [address] [place], [place] [address]   Respiratory Panel by PCR     Status: None   Collection Time: 05/26/20  6:19 PM   Specimen: Nasopharyngeal Swab; Respiratory  Result Value Ref Range Status   Adenovirus NOT DETECTED NOT DETECTED Final   Coronavirus 229E NOT DETECTED NOT DETECTED Final    Comment: (NOTE) The Coronavirus on the Respiratory Panel, DOES NOT test for the novel  Coronavirus (2019 nCoV)    Coronavirus HKU1 NOT DETECTED NOT DETECTED Final   Coronavirus NL63 NOT DETECTED NOT DETECTED Final   Coronavirus OC43 NOT DETECTED NOT DETECTED Final   Metapneumovirus NOT DETECTED NOT DETECTED Final   Rhinovirus / Enterovirus NOT DETECTED NOT DETECTED Final   Influenza A NOT DETECTED NOT DETECTED Final   Influenza B NOT DETECTED NOT DETECTED Final   Parainfluenza Virus 1 NOT DETECTED NOT DETECTED Final   Parainfluenza Virus 2 NOT DETECTED NOT DETECTED Final   Parainfluenza Virus 3 NOT DETECTED NOT DETECTED Final   Parainfluenza Virus 4 NOT DETECTED NOT DETECTED Final   Respiratory Syncytial Virus NOT DETECTED NOT DETECTED Final   Bordetella pertussis NOT DETECTED NOT DETECTED Final  Chlamydophila pneumoniae NOT DETECTED NOT DETECTED Final   Mycoplasma pneumoniae NOT DETECTED NOT DETECTED Final    Comment: Performed at [place], [place] [address] [place], [place] [address]  MRSA PCR Screening     Status: None   Collection Time: 05/30/20 11:18 AM   Specimen: Nasal Mucosa; Nasopharyngeal  Result Value Ref Range Status   MRSA by PCR NEGATIVE NEGATIVE Final    Comment:        The GeneXpert MRSA Assay (FDA approved for NASAL specimens only), is one component of a comprehensive MRSA colonization surveillance program. It is not intended to diagnose MRSA infection nor to guide or monitor treatment for MRSA infections. Performed at [facility], [place] [address], [place],  [address]       Radiology Studies: CT CHEST WO CONTRAST  Result Date: 05/30/2020 CLINICAL DATA:  Inpatient. Respiratory failure. Worsening dyspnea. History of metastatic gastric cancer and prostate cancer. EXAM: CT CHEST WITHOUT CONTRAST TECHNIQUE: Multidetector CT imaging of the chest was performed following the standard protocol without IV contrast. COMPARISON:  Chest radiograph from one day prior. 05/25/2020 chest CT angiogram. FINDINGS: Cardiovascular: Top-normal heart size. Moderate pericardial effusion is unchanged. Left anterior descending coronary atherosclerosis. Right internal jugular Port-A-Cath terminates at the cavoatrial junction. Atherosclerotic nonaneurysmal thoracic aorta. Normal caliber pulmonary arteries. Mediastinum/Nodes: Coarsely calcified 1.1 cm left thyroid nodule is stable. Not clinically significant; no follow-up imaging recommended (ref: J Am Coll Radiol. 2015 Feb;12(2): 143-50). Unremarkable esophagus. No axillary adenopathy. Coarsely calcified nonenlarged bilateral paratracheal, subcarinal, AP window and bilateral hilar lymph nodes compatible with prior granulomatous disease, unchanged. No pathologically enlarged noncalcified mediastinal or discrete hilar nodes on this noncontrast scan. Lungs/Pleura: No pneumothorax. Small bilateral pleural effusions, right greater than left, possibly loculated on the  right, mildly increased bilaterally. There is extensive patchy consolidation and ground-glass opacity throughout both lungs involving all lung lobes, most prominent in the perihilar right lung with associated volume loss, widespread moderate bronchiectasis and architectural distortion. The consolidation and ground-glass opacity is stable to minimally improved since recent 05/25/2020 chest CT angiogram study. These findings are largely new since 09/19/2018 chest CT study. Anterior left upper lobe solid 0.9 cm pulmonary nodule (series 5/image 56) is stable since 05/25/2020 chest CT and new since 09/19/2018 chest CT. Upper abdomen: Multiple indistinct confluent hypodense liver masses, largest 6.1 cm in the right liver (series 2/image 119), not well evaluated on this noncontrast study. Musculoskeletal: No aggressive appearing focal osseous lesions. Mild thoracic spondylosis. IMPRESSION: 1. Extensive patchy consolidation and ground-glass opacity throughout both lungs, stable to minimally improved since recent 05/25/2020 chest CT angiogram study. Widespread moderate bronchiectasis with some parenchymal distortion and volume loss. Findings are essentially new since 09/19/2018 chest CT study and suggest a severe multilobar pneumonia potentially superimposed on an underlying fibrotic interstitial lung disease. 2. Solid 0.9 cm anterior left upper lobe pulmonary nodule, stable since 05/25/2020 chest CT and new since 09/19/2018 chest CT, cannot exclude pulmonary metastasis. 3. Small bilateral pleural effusions, right greater than left, mildly increased bilaterally, potentially loculated on the right. 4. Moderate pericardial effusion, unchanged. 5. Multiple indistinct confluent hypodense liver masses, not well evaluated on this noncontrast study, compatible with known liver metastases. 6. One vessel coronary atherosclerosis. 7. Aortic Atherosclerosis (ICD10-I70.0). Electronically Signed   By: Ilona Sorrel M.D.   On: 05/30/2020  17:46      Scheduled Meds: . apixaban  2.5 mg Oral BID  . chlorhexidine  15 mL Mouth Rinse BID  .  Chlorhexidine Gluconate Cloth  6 each Topical Daily  . docusate sodium  200 mg Oral Daily  . lactose free nutrition  237 mL Oral TID WC  . latanoprost  1 drop Both Eyes QHS  . mouth rinse  15 mL Mouth Rinse q12n4p  . methylPREDNISolone (SOLU-MEDROL) injection  60 mg Intravenous Q6H  . metoprolol tartrate  12.5 mg Oral BID  . multivitamin with minerals  1 tablet Oral Daily  . polyethylene glycol  17 g Oral Daily   Continuous Infusions: . ceFEPime (MAXIPIME) IV Stopped (05/31/20 0601)      Assessment/Plan:  1.  Acute hypoxic respiratory failure: Secondary to infectious process versus ILD associated with trastuzumab versus lymphangitic spread of malignancy versus pulmonary edema.AFB, BAL cultures so far negative.  PCP by DFA on 9/15 -ve.  No malignant cells identified on bronch/cytology.  Seen by PCCM and receiving Lasix trial (40 mg IV x1).  Resumed on Eliquis for recent history of PE.  PCCM recommends continuing IV cefepime-day 6.  Also on IV Solu-Medrol 60 mg every 6 hours.  Taper O2 as tolerated, currently on high flow 15 L as well as NRB M as needed.  Appreciate PCCM follow-up and recommendations.  2.  Chronic combined systolic, diastolic CHF: Clinically does not appear to be volume overloaded.  BNP 50-70.  Noted PCCM giving Lasix trial due to increased O2 requirements in the last 24 hours.  Continue low-dose beta-blockers  3.  History of PE (diagnosed in 09/2018): In the setting of gastric adenocarcinoma diagnosis.  Resumed on Eliquis after bronchoscopy.  4.  Hypertension, uncontrolled: Norvasc and Lotensin were discontinued for low normal blood pressure yesterday.  Now BP up in systolic 631S.  Resume Norvasc at lower dose.  5.  NSVT: Isolated episode on the night of 9/16-17.  Electrolytes replaced.  Lopressor started.  Continue to monitor telemetry and electrolytes.  DVT  prophylaxis: On Eliquis Code Status: DNR Family / Patient Communication: Palliative care team in discussions with patient and family.  I briefly talked to patient to avoid interruption with palliative care encounter.  Will follow-up in a.m. Disposition Plan:   Status is: Inpatient  Remains inpatient appropriate because:IV treatments appropriate due to intensity of illness or inability to take PO and Inpatient level of care appropriate due to severity of illness   Dispo: The patient is from: Home              Anticipated d/c is to: To be decided              Anticipated d/c date is: > 3 days              Patient currently is not medically stable to d/c.           Time spent: 35 minutes     >50% time spent in discussions with care team and coordination of care.    [place]ford Shi, MD [facility] Pager in [place]  If 7PM-7AM, please contact night-coverage [email or web address] 05/31/2020, 12:03 PM

## 2020-05-31 NOTE — Progress Notes (Signed)
NAME:  Peter Wood, MRN:  [id number], DOB:  [birth year], LOS: 5 ADMISSION DATE:  05/25/2020, CONSULTATION DATE:  05/26/20 REFERRING MD: Dr. Maryland Pink, CHIEF COMPLAINT:  SOB, Fever   Brief History   85 y/o M admitted 9/14 with reports of shortness of breath and fever.  He has a history of gastric adenocarcinoma with metastatic disease to liver and bone (initial diagnosis 09/2018) s/p chemotherapy with slow progression on Enhertu (last dose 04/28/20).   He had a CXR on 8/18 that demonstrated bilateral infiltrates.  COVID testing was negative at that time.  However, he was treated with monoclonal antibody (casirivimab / imdevimab) on 8/19 followed by a course of azithromycin without significant improvement.    Past Medical History  HTN PE - on eliquis  Prostate Cancer - s/p radiation treatment  Gastric adenocarcinoma with metastatic disease to liver and bone, initial dx 09/2018  Significant Hospital Events   9/14 Admit 9/15 PCCM consulted for pulmonary evaluation  9/16 Steroids initiated  9/19 Worsening hypoxia, transfer to SDU  Consults:  PCCM  Procedures:    Significant Diagnostic Tests:  CTA Chest 9/15 >> negative for PE, extensive bilateral asymmetric airspace opacities, small right pleural effusion, small simple appearing pericardial effusion without CT evidence of tamponade, central pulmonary arterial enlargement FOB 9/15 >> clear airways, minimal secretions, scant bloody tinged secretions in LLL CT Chest w/o 9/19 >> extensive patchy consolidation and GGO throughout both lungs, stable to minimally improved since 9/14, widespread moderate bronchiectasis with some parenchymal distortion and volume loss - findings are essentially new since 09/2018 and suggest a severe multilobar PNA potentially superimposed on underlying fibrotic ILD, solid 0.9 cm anterior LUL pulmonary nodule, small bilateral pleural effusions R>L, moderate pericardial effusion, multiple indistinct confluent hypodense  liver masses compatible with known liver metastases  Micro Data:  COVID 9/14 >> negative  RVP 9/15 >> negative  BAL 9/15 >> negative  AFB 9/15 >> negative  PCP by DFA 9/15 >> negative  Cytology 9/15 >> benign bronchial cells, no malignant cells identified  Antimicrobials:  Unasyn 9/15 >> 9/19 Cefepime 9/19 >>   Interim history/subjective:  Remains on HFNC 15L I/O 1.7L UOP, -1.4L in last 24 hours Pt reports his breathing is "not good and feels abnormal".  On HFNC + NRB while eating breakfast  Objective   Blood pressure (!) 151/96, pulse 71, temperature 98 F (36.7 C), temperature source Oral, resp. rate 16, height _0  (1.626 m), weight 61.2 kg, SpO2 91 %.        Intake/Output Summary (Last 24 hours) at 05/31/2020 0830 Last data filed at 05/31/2020 0700 Gross per 24 hour  Intake 299.82 ml  Output 1700 ml  Net -1400.18 ml   Filed Weights   05/25/20 1744 05/26/20 1234  Weight: 61.2 kg 61.2 kg    Examination: General: pleasant, cachectic adult male lying in bed in NAD HEENT: MM pink/moist, NRB + HFNC in place, anicteric, glasses in place Neuro: AAOx4, speech clear, MAE CV: s1s2 RRR, no m/r/g PULM: tachypnea but not distressed, lungs bilaterally coarse with bibasilar crackles  GI: soft, bsx4 active, tolerating PO's but little intake Extremities: warm/dry, 1+ BLE edema  Skin: no rashes or lesions  Resolved Hospital Problem list      Assessment & Plan:    Acute Hypoxic Respiratory Failure in setting of Diffuse Bilateral Infiltrates  Fever  Hx Pulmonary Embolism on Eliquis  Recent hx of negative COVID testing, however treated with monoclonal antibodies on 8/19 & azithromycin.  Differential dx  includes viral / bacterial infection, component pulmonary edema with noted central pulmonary arterial enlargement, BOOP/COP, pneumonitis / ILD associated with trastuzumab and lymphangitic spread of malignancy. CT angio negative for PE and he is on chronic anticoagulation for prior  PE. Minimal secretions on bronchoscopy. Suspect most likely dx is pneumonitis vs malignant spread. FOB negative for bacterial, AFB, PCP.   -continue home eliquis  -keep solumedrol at current dose  -cultures negative on FOB  -continue cefepime, D6/x abx  -lasix 40 mg IV x1 with 20 mEq KCL and follow up BMP in am  -continue SDU monitoring -DNR in the event of arrest, no intubation  -pulmonary hygiene   Goals of care Prior discussion with patient > clearly stated that he would not want intubation or CPR CODE STATUS changed to DNR Wife was updated on prior conversation.      Best practice:  Diet: As tolerated, per primary  Pain/Anxiety/Delirium protocol (if indicated): n/a  VAP protocol (if indicated): n/a  DVT prophylaxis: eliquis  GI prophylaxis: per primary  Glucose control: per primary  Mobility: as tolerated  Code Status: DNR Family Communication: Per primary  Disposition: Per primary   Labs   CBC: Recent Labs  Lab 05/25/20 1552 05/26/20 0448 05/27/20 0304 05/28/20 0420 05/31/20 0429  WBC 10.0 11.4* 15.2* 7.5 11.7*  NEUTROABS 7.8*  --   --   --   --   HGB 10.4* 9.7* 9.4* 9.1* 8.9*  HCT 31.3* 29.6* 28.0* 27.8* 28.5*  MCV 101.6* 104.2* 102.9* 103.3* 105.9*  PLT 318 305 303 329 704    Basic Metabolic Panel: Recent Labs  Lab 05/27/20 0304 05/28/20 0420 05/29/20 0331 05/30/20 0338 05/31/20 0429  NA 134* 142 141 146* 144  K 3.9 3.8 3.8 4.1 3.9  CL 98 101 102 104 104  CO2 _0 GLUCOSE 106* 153* 127* 121* 110*  BUN 15 24* 31* 32* 29*  CREATININE 0.75 0.72 0.68 0.66 0.62  CALCIUM 8.0* 8.7* 8.8* 9.2 8.9  MG  --   --  2.3  --   --    GFR: Estimated Creatinine Clearance: 59.6 mL/min (by C-G formula based on SCr of 0.62 mg/dL). Recent Labs  Lab 05/25/20 1805 05/25/20 2130 05/26/20 0448 05/27/20 0304 05/27/20 0305 05/28/20 0420 05/31/20 0429  PROCALCITON 1.96  --   --   --  11.09  --   --   WBC  --   --  11.4* 15.2*  --  7.5 11.7*    LATICACIDVEN 1.4 1.3  --   --   --   --   --     Liver Function Tests: Recent Labs  Lab 05/26/20 0448 05/27/20 0304 05/28/20 0420 05/29/20 0331 05/31/20 0429  AST 79* 52* 54* 119* 84*  ALT 63* 46* 43 71* 83*  ALKPHOS 386* 358* 374* 463* 412*  BILITOT 0.9 1.5* 1.2 0.9 1.3*  PROT 6.1* 5.6* 5.9* 5.8* 5.6*  ALBUMIN 2.2* 2.0* 1.8* 2.1* 2.1*   No results for input(s): LIPASE, AMYLASE in the last 168 hours. No results for input(s): AMMONIA in the last 168 hours.  ABG No results found for: PHART, PCO2ART, PO2ART, HCO3, TCO2, ACIDBASEDEF, O2SAT   Coagulation Profile: Recent Labs  Lab 05/26/20 0448 05/27/20 0304 05/28/20 0420  INR 1.5* 1.6* 1.8*    Cardiac Enzymes: No results for input(s): CKTOTAL, CKMB, CKMBINDEX, TROPONINI in the last 168 hours.  HbA1C: No results found for: HGBA1C  CBG: No results for input(s): GLUCAP in the  last 168 hours.     Critical care time: n/a     Noe Gens, MSN, NP-C [facility] 05/31/2020, 8:30 AM   Please see [email or web address] for pager details.

## 2020-05-31 NOTE — Consult Note (Signed)
Consultation Note Date: 05/31/2020   Patient Name: Peter Wood  DOB: December 02, 1934  MRN: [id number]  Age / Sex: 85 y.o., male  PCP: Plotnikov, Evie Lacks, MD Referring Physician: Guilford Shi, MD  Reason for Consultation: Establishing goals of care  HPI/Patient Profile: 85 y.o. male  with past medical history of HTN, PE, gastric adenocarcimona with mets to liver, pancreas, mesenteric and peripancreatic lymph nodes, iliac bone- had initial response, but ultimately progressed through several regimens- last treated with ENHERTU on 04/28/20 admitted on 05/25/2020 with cough and worsening shortness of breath and fever. Workup has been concerning for atypical multifocal pnuemonia, interstitial lung disease, pneumonitis, lymphangitic spread,- cytology from bronch was negative for malignancy. Respiratory cultures negative. He is being treated with steroids and antibiotics, continues to require high amounts of oxygen- 15L high flow plus non rebreather. Of note-  August 18th he had significant cough and appearance on xray of Covid pneumonia but negative Covid tests- however, he was treated with antibodies due to being high risk and high concern for false negative. During this hospitalization- oxygen requirements are not lessening, CT today showed stable to minimally improved consolidation and ground-glass opacity with findings concerning for severe multilobar pneumonia potentially superimposed on an underlying fibrotic interstitial lung disease.  Per discussion with attending team he is DNR. Palliative consulted for further goals of care discussion.   Clinical Assessment and Goals of Care:  I have reviewed medical records including EPIC notes, labs and imaging, examined the patient and met at bedside with the patient and spoke with his spouse via phone to discuss diagnosis prognosis, GOC, EOL wishes, disposition and  options.  I introduced Palliative Medicine as specialized medical care for people living with serious illness. It focuses on providing relief from the symptoms and stress of a serious illness.   We discussed a brief life review of the patient. He is married and has four children that bring him great joy. 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  As far as functional and nutritional status- prior to this admission he was independent at home- he has had significant weight loss since his cancer diagnosis and treatment.    We discussed his current illness and what it means in the larger context of his on-going co-morbidities.  Natural disease trajectory and expectations at EOL were discussed.   Peter Wood expressed his desired goals of care would be to return him to a functional independent state. He stated he would not want to be left debilitated or in a situation where he would be dependent for care. His spouse confirms that they have had this discussion prior and have made this agreement.   Peter Wood and spouse expressed that if his medical providers felt that he was not going to improve- then he would wish to try and be at home with his family for his final days and hours.   For now, Peter Wood and [name] remain hopeful that he will start to improve with medical treatment. They wish to continue  his care with limit set at no intubation.   Symptom management was discussed- recommend low dose morphine to decrease anxiety and air hunger and allow improvement in oral intake. Patient and spouse wish to discuss this with each other. I let them know I would enter the order and they could request at their will.   Questions and concerns were addressed.  The family was encouraged to call with questions or concerns.   Primary Decision Maker PATIENT- along with support of his spouse and family    SUMMARY OF RECOMMENDATIONS -DNR -Continue current care- if patient's prognosis becomes clearly poor  for meaningful functional recovery- then patient would like efforts made to transition home and made comfortable for end of life care- for now wishes are to continue aggressive medical care with limits set at not intubation, no CPR -Liquid morphine 2.5mg  sublingual q2hr prn for SOB -PMT will follow and assist with goals of care discussion    Code Status/Advance Care Planning:  DNR  Psycho-social/Spiritual:   Desire for further Chaplaincy support:yes  Prognosis:    Unable to determine  Discharge Planning: To Be Determined  Primary Diagnoses: Present on Admission: . Multifocal pneumonia . Essential hypertension . Liver metastases ([place]) . Metastasis from malignant tumor of stomach ([place]) . Gastric cancer ([place]) . Iron deficiency anemia due to chronic blood loss . Hypoalbuminemia . Abnormal LFTs . Acute respiratory failure with hypoxia ([place]) . Chronic diastolic CHF (congestive heart failure) ([place])   I have reviewed the medical record, interviewed the patient and family, and examined the patient. The following aspects are pertinent.  Past Medical History:  Diagnosis Date  . Arthritis   . Chronic diastolic CHF (congestive heart failure) ([place]) 05/27/2020  . Diverticulosis of colon   . Family history of breast cancer   . Family history of prostate cancer   . HTN (hypertension)   . Hx of colonic polyp   . Pneumonia    as a child/baby  . Poor circulation of extremity    right leg  . Prostate cancer ([place]) 2015   prostate cancer - radiation treated  . Pulmonary embolism ([place])    on eliquis    Social History   Socioeconomic History  . Marital status: Married    Spouse name: Not on file  . Number of children: 4  . Years of education: Not on file  . Highest education level: Not on file  Occupational History  . Occupation: retired  Tobacco Use  . Smoking status: Former Smoker    Years: 4.00    Quit date: 09/12/1967    Years since quitting: 52.7  . Smokeless tobacco:  Never Used  Vaping Use  . Vaping Use: Never used  Substance and Sexual Activity  . Alcohol use: No  . Drug use: No  . Sexual activity: Not on file  Other Topics Concern  . Not on file  Social History Narrative  . Not on file   Social Determinants of Health   Financial Resource Strain: Low Risk   . Difficulty of Paying Living Expenses: Not hard at all  Food Insecurity: No Food Insecurity  . Worried About Charity fundraiser in the Last Year: Never true  . Ran Out of Food in the Last Year: Never true  Transportation Needs: No Transportation Needs  . Lack of Transportation (Medical): No  . Lack of Transportation (Non-Medical): No  Physical Activity: Insufficiently Active  . Days of Exercise per Week: 5 days  . Minutes of Exercise  per Session: 10 min  Stress: No Stress Concern Present  . Feeling of Stress : Not at all  Social Connections:   . Frequency of Communication with Friends and Family: Not on file  . Frequency of Social Gatherings with Friends and Family: Not on file  . Attends Religious Services: Not on file  . Active Member of Clubs or Organizations: Not on file  . Attends Archivist Meetings: Not on file  . Marital Status: Not on file   Scheduled Meds: . apixaban  2.5 mg Oral BID  . chlorhexidine  15 mL Mouth Rinse BID  . Chlorhexidine Gluconate Cloth  6 each Topical Daily  . docusate sodium  200 mg Oral Daily  . lactose free nutrition  237 mL Oral TID WC  . latanoprost  1 drop Both Eyes QHS  . mouth rinse  15 mL Mouth Rinse q12n4p  . methylPREDNISolone (SOLU-MEDROL) injection  60 mg Intravenous Q6H  . metoprolol tartrate  12.5 mg Oral BID  . multivitamin with minerals  1 tablet Oral Daily  . polyethylene glycol  17 g Oral Daily   Continuous Infusions: . ceFEPime (MAXIPIME) IV Stopped (05/31/20 0601)   PRN Meds:.acetaminophen, benzonatate, hydrALAZINE, morphine, sodium chloride flush Medications Prior to Admission:  Prior to Admission medications    Medication Sig Start Date End Date Taking? Authorizing Provider  amLODipine (NORVASC) 5 MG tablet Take 1 tablet (5 mg total) by mouth daily. 12/11/19  Yes Plotnikov, Evie Lacks, MD  apixaban (ELIQUIS) 2.5 MG TABS tablet Take 1 tablet (2.5 mg total) by mouth 2 (two) times daily. 04/12/20  Yes Plotnikov, Evie Lacks, MD  benazepril (LOTENSIN) 40 MG tablet Take 0.5 tablets (20 mg total) by mouth daily. 04/12/20  Yes Plotnikov, Evie Lacks, MD  benzonatate (TESSALON) 100 MG capsule Take 1 capsule (100 mg total) by mouth 2 (two) times daily as needed for cough. 05/18/20  Yes Causey, Charlestine Massed, NP  Cholecalciferol 1000 UNITS tablet Take 1,000 Units by mouth daily.    Yes [provider]  latanoprost (XALATAN) 0.005 % ophthalmic solution Place 1 drop into both eyes at bedtime.  10/24/17  Yes [provider]  lidocaine-prilocaine (EMLA) cream Apply to affected area once 11/06/18  Yes Magrinat, Virgie Dad, MD  Misc Natural Products (AIRBORNE ELDERBERRY) CHEW Chew by mouth daily. X 2 Gummies   Yes [provider]  azithromycin (ZITHROMAX Z-PAK) 250 MG tablet Take as directed Patient not taking: Reported on 05/25/2020 05/18/20   Gardenia Phlegm, NP  ondansetron (ZOFRAN) 4 MG tablet Take 1 tablet (4 mg total) by mouth every 8 (eight) hours as needed for nausea or vomiting. 10/01/18   Plotnikov, Evie Lacks, MD  predniSONE (DELTASONE) 5 MG tablet Take 1 tablet by mouth daily with breakfast Patient not taking: Reported on 05/25/2020 03/16/20   Harle Stanford., PA-C  promethazine-codeine (PHENERGAN WITH CODEINE) 6.25-10 MG/5ML syrup Take 5 mLs by mouth every 8 (eight) hours as needed for cough. Patient not taking: Reported on 05/26/2020 05/24/20   Magrinat, Virgie Dad, MD  triamcinolone lotion (KENALOG) 0.1 % Apply 1 application topically 3 (three) times daily. Patient not taking: Reported on 05/26/2020 03/05/20   Harle Stanford., PA-C   No Known Allergies Review of Systems  Constitutional: Positive  for activity change, appetite change, fatigue and unexpected weight change.  Respiratory: Positive for cough and shortness of breath.     Physical Exam Vitals and nursing note reviewed.  Constitutional:      Appearance:  He is ill-appearing.     Comments: Frail, cachectic  Cardiovascular:     Rate and Rhythm: Tachycardia present.  Pulmonary:     Comments: Increased WOB, dyspnea at rest Neurological:     Mental Status: He is oriented to person, place, and time.     Comments: lethargic  Psychiatric:        Behavior: Behavior normal.        Thought Content: Thought content normal.     Vital Signs: BP 134/85 (BP Location: Right Arm)   Pulse 92   Temp 97.7 F (36.5 C) (Oral)   Resp (!) 26   Ht $R'5\' 4"'GG$  (1.626 m)   Wt 61.2 kg   SpO2 91%   BMI 23.17 kg/m  Pain Scale: 0-10   Pain Score: 0-No pain   SpO2: SpO2: 91 % O2 Device:SpO2: 91 % O2 Flow Rate: .O2 Flow Rate (L/min): 15 L/min  IO: Intake/output summary:   Intake/Output Summary (Last 24 hours) at 05/31/2020 1141 Last data filed at 05/31/2020 0800 Gross per 24 hour  Intake 359.82 ml  Output 1700 ml  Net -1340.18 ml    LBM: Last BM Date: 05/30/20 Baseline Weight: Weight: 61.2 kg Most recent weight: Weight: 61.2 kg     Palliative Assessment/Data: PPS: 20%     Thank you for this consult. Palliative medicine will continue to follow and assist as needed.    Time Total: 124 minutes Greater than 50%  of this time was spent counseling and coordinating care related to the above assessment and plan.  Signed by: Mariana Kaufman, AGNP-C Palliative Medicine    Please contact Palliative Medicine Team phone at [phone] for questions and concerns.  For individual provider: See Shea Evans

## 2020-05-31 NOTE — Progress Notes (Signed)
Peter Wood   DOB:March 13, 1935   QI#:[id number]   LOV#:[id number]  Subjective:  Peter Wood is now in stepdown. This is day 4 steroids. He is also on cefepime. Cultures remain negative. Hwe tells me he is having trouble with his voice. Ge gets SOB when he tries to do anything and then he gets anxious. Denies pain. Had a good BM today. Wife not in room (but I called her after the visit)    Objective: older African American man examined in bed Vitals:   05/31/20 1403 05/31/20 1500  BP: 131/88 123/79  Pulse:    Resp: (!) 26 20  Temp:    SpO2: 93% 95%    Body mass index is 23.17 kg/m.  Intake/Output Summary (Last 24 hours) at 05/31/2020 1554 Last data filed at 05/31/2020 1500 Gross per 24 hour  Intake 399.82 ml  Output 3400 ml  Net -3000.18 ml    .  Lungs no rales or wheezes--auscultated anterolaterally  Heart regular rate and rhythm with premature beats    CBG (last 3)  No results for input(s): GLUCAP in the last 72 hours.   Labs:  Lab Results  Component Value Date   WBC 11.7 (H) 05/31/2020   HGB 8.9 (L) 05/31/2020   HCT 28.5 (L) 05/31/2020   MCV 105.9 (H) 05/31/2020   PLT 297 05/31/2020   NEUTROABS 7.8 (H) 05/25/2020    _0 @  Urine Studies No results for input(s): UHGB, CRYS in the last 72 hours.  Invalid input(s): UACOL, UAPR, USPG, UPH, UTP, UGL, UKET, UBIL, UNIT, UROB, ULEU, UEPI, UWBC, URBC, UBAC, [place], [place], [place]  Basic Metabolic Panel: Recent Labs  Lab 05/27/20 0304 05/27/20 0304 05/28/20 0420 05/28/20 0420 05/29/20 0331 05/29/20 0331 05/30/20 0338 05/31/20 0429  NA 134*  --  142  --  141  --  146* 144  K 3.9   < > 3.8   < > 3.8   < > 4.1 3.9  CL 98  --  101  --  102  --  104 104  CO2 26  --  30  --  29  --  31 31  GLUCOSE 106*  --  153*  --  127*  --  121* 110*  BUN 15  --  24*  --  31*  --  32* 29*  CREATININE 0.75  --  0.72  --  0.68  --  0.66 0.62  CALCIUM 8.0*  --  8.7*  --  8.8*  --  9.2 8.9  MG  --   --   --   --  2.3  --   --    --    < > = values in this interval not displayed.   GFR Estimated Creatinine Clearance: 59.6 mL/min (by C-G formula based on SCr of 0.62 mg/dL). Liver Function Tests: Recent Labs  Lab 05/26/20 0448 05/27/20 0304 05/28/20 0420 05/29/20 0331 05/31/20 0429  AST 79* 52* 54* 119* 84*  ALT 63* 46* 43 71* 83*  ALKPHOS 386* 358* 374* 463* 412*  BILITOT 0.9 1.5* 1.2 0.9 1.3*  PROT 6.1* 5.6* 5.9* 5.8* 5.6*  ALBUMIN 2.2* 2.0* 1.8* 2.1* 2.1*   No results for input(s): LIPASE, AMYLASE in the last 168 hours. No results for input(s): AMMONIA in the last 168 hours. Coagulation profile Recent Labs  Lab 05/26/20 0448 05/27/20 0304 05/28/20 0420  INR 1.5* 1.6* 1.8*    CBC: Recent Labs  Lab 05/25/20 1552 05/26/20 0448 05/27/20 0304 05/28/20 0420 05/31/20 8841  WBC 10.0 11.4* 15.2* 7.5 11.7*  NEUTROABS 7.8*  --   --   --   --   HGB 10.4* 9.7* 9.4* 9.1* 8.9*  HCT 31.3* 29.6* 28.0* 27.8* 28.5*  MCV 101.6* 104.2* 102.9* 103.3* 105.9*  PLT 318 305 303 329 297   Cardiac Enzymes: No results for input(s): CKTOTAL, CKMB, CKMBINDEX, TROPONINI in the last 168 hours. BNP: Invalid input(s): POCBNP CBG: No results for input(s): GLUCAP in the last 168 hours. D-Dimer No results for input(s): DDIMER in the last 72 hours. Hgb A1c No results for input(s): HGBA1C in the last 72 hours. Lipid Profile No results for input(s): CHOL, HDL, LDLCALC, TRIG, CHOLHDL, LDLDIRECT in the last 72 hours. Thyroid function studies No results for input(s): TSH, T4TOTAL, T3FREE, THYROIDAB in the last 72 hours.  Invalid input(s): FREET3 Anemia work up No results for input(s): VITAMINB12, FOLATE, FERRITIN, TIBC, IRON, RETICCTPCT in the last 72 hours. Microbiology Recent Results (from the past 240 hour(s))  Culture, Blood     Status: None   Collection Time: 05/25/20  3:52 PM   Specimen: BLOOD  Result Value Ref Range Status   Specimen Description BLOOD LEFT ANTECUBITAL  Final   Special Requests   Final     BOTTLES DRAWN AEROBIC AND ANAEROBIC Blood Culture results may not be optimal due to an excessive volume of blood received in culture bottles   Culture   Final    NO GROWTH 5 DAYS Performed at [facility], [place] [address], [place], [place] [address]    Report Status 05/30/2020 FINAL  Final  Culture, Blood     Status: None   Collection Time: 05/25/20  4:48 PM   Specimen: BLOOD  Result Value Ref Range Status   Specimen Description BLOOD PORTA CATH  Final   Special Requests   Final    BOTTLES DRAWN AEROBIC AND ANAEROBIC Blood Culture adequate volume   Culture   Final    NO GROWTH 5 DAYS Performed at [facility], [place] [address] [place], [place] [address]    Report Status 05/30/2020 FINAL  Final  SARS Coronavirus 2 by RT PCR (hospital order, performed in [facility] hospital lab) Nasopharyngeal Nasopharyngeal Swab     Status: None   Collection Time: 05/25/20  6:05 PM   Specimen: Nasopharyngeal Swab  Result Value Ref Range Status   SARS Coronavirus 2 NEGATIVE NEGATIVE Final    Comment: (NOTE) SARS-CoV-2 target nucleic acids are NOT DETECTED.  The SARS-CoV-2 RNA is generally detectable in upper and lower respiratory specimens during the acute phase of infection. The lowest concentration of SARS-CoV-2 viral copies this assay can detect is 250 copies / mL. A negative result does not preclude SARS-CoV-2 infection and should not be used as the sole basis for treatment or other patient management decisions.  A negative result may occur with improper specimen collection / handling, submission of specimen other than nasopharyngeal swab, presence of viral mutation(s) within the areas targeted by this assay, and inadequate number of viral copies (<250 copies / mL). A negative result must be combined with clinical observations, patient history, and epidemiological information.  Fact Sheet for Patients:   StrictlyIdeas.no  Fact Sheet for Healthcare  Providers: BankingDealers.co.za  This test is not yet approved or  cleared by the [place] FDA and has been authorized for detection and/or diagnosis of SARS-CoV-2 by FDA under an Emergency Use Authorization (EUA).  This EUA will remain in effect (meaning this test can be used)  for the duration of the COVID-19 declaration under Section 564(b)(1) of the Act, 21 U.S.C. section 360bbb-3(b)(1), unless the authorization is terminated or revoked sooner.  Performed at [facility], [place] [address] [place], [place] [address]   MRSA PCR Screening     Status: None   Collection Time: 05/25/20 11:45 PM   Specimen: Nasal Mucosa; Nasopharyngeal  Result Value Ref Range Status   MRSA by PCR NEGATIVE NEGATIVE Final    Comment:        The GeneXpert MRSA Assay (FDA approved for NASAL specimens only), is one component of a comprehensive MRSA colonization surveillance program. It is not intended to diagnose MRSA infection nor to guide or monitor treatment for MRSA infections. Performed at [facility], [place] [address], [place], [place] [address]   Acid Fast Smear (AFB)     Status: None   Collection Time: 05/26/20  1:26 PM   Specimen: Bronchial Alveolar Lavage  Result Value Ref Range Status   AFB Specimen Processing Concentration  Final   Acid Fast Smear Negative  Final    Comment: (NOTE) Performed At: [facility] [place], [place] [id number] Rush Farmer MD [id number]    Source (AFB) BRONCHIAL ALVEOLAR LAVAGE  Final    Comment: RML Performed at [facility], [place] [address], [place], [place] [address]   Pneumocystis smear by DFA     Status: None   Collection Time: 05/26/20  1:26 PM   Specimen: Bronchial Alveolar Lavage; Respiratory  Result Value Ref Range Status   Specimen Source-PJSRC BRONCHIAL ALVEOLAR LAVAGE  Final    Comment: RML   Pneumocystis jiroveci Ag NEGATIVE  Final     Comment: Performed at [place] Performed at [place] [address] [place], [place] [address]   Culture, respiratory     Status: None   Collection Time: 05/26/20  1:37 PM   Specimen: Bronchoalveolar Lavage; Respiratory  Result Value Ref Range Status   Specimen Description   Final    BRONCHIAL ALVEOLAR LAVAGE RML Performed at [place] [address], [place], [place] [address]    Special Requests   Final    NONE Performed at [facility], [place] [address] [place], [place] [address]    Gram Stain   Final    MODERATE WBC PRESENT, PREDOMINANTLY MONONUCLEAR NO ORGANISMS SEEN    Culture   Final    NO GROWTH 2 DAYS Performed at [place] [address] [place], [place] [address]    Report Status 05/29/2020 FINAL  Final  Acid Fast Smear (AFB)     Status: None   Collection Time: 05/26/20  1:53 PM   Specimen: Bronchial Alveolar Lavage  Result Value Ref Range Status   AFB Specimen Processing Concentration  Final   Acid Fast Smear Negative  Final    Comment: (NOTE) Performed At: [facility] [place], [place] [id number] Rush Farmer MD [id number]    Source (AFB) BRONCHIAL ALVEOLAR LAVAGE  Final    Comment: RUL Performed at [facility], [place] [address], [place],  [address]   Culture, respiratory     Status: None   Collection Time: 05/26/20  1:54 PM   Specimen: Bronchoalveolar Lavage; Respiratory  Result Value Ref Range Status   Specimen Description   Final    BRONCHIAL ALVEOLAR LAVAGE RUL Performed at [place]  [address] [address] [place], [place] [address]    Special Requests   Final    NONE Performed at [facility], [place] [address], [place], [place] [address]    Gram Stain   Final    MODERATE WBC PRESENT, PREDOMINANTLY MONONUCLEAR NO ORGANISMS SEEN    Culture   Final    NO GROWTH 2  DAYS Performed at [place] [address] [place], [place] [address]    Report Status 05/29/2020 FINAL  Final  Pneumocystis smear by DFA     Status: None   Collection Time: 05/26/20  1:54 PM   Specimen: Bronchial Alveolar Lavage; Respiratory  Result Value Ref Range Status   Specimen Source-PJSRC BRONCHIAL ALVEOLAR LAVAGE  Final    Comment: RUL   Pneumocystis jiroveci Ag NEGATIVE  Final    Comment: Performed at [place] [facility] Performed at [facility] [place] [address], [place], [place] [address]   Respiratory Panel by PCR     Status: None   Collection Time: 05/26/20  6:19 PM   Specimen: Nasopharyngeal Swab; Respiratory  Result Value Ref Range Status   Adenovirus NOT DETECTED NOT DETECTED Final   Coronavirus 229E NOT DETECTED NOT DETECTED Final    Comment: (NOTE) The Coronavirus on the Respiratory Panel, DOES NOT test for the novel  Coronavirus (2019 nCoV)    Coronavirus HKU1 NOT DETECTED NOT DETECTED Final   Coronavirus NL63 NOT DETECTED NOT DETECTED Final   Coronavirus OC43 NOT DETECTED NOT DETECTED Final   Metapneumovirus NOT DETECTED NOT DETECTED Final   Rhinovirus / Enterovirus NOT DETECTED NOT DETECTED Final   Influenza A NOT DETECTED NOT DETECTED Final   Influenza B NOT DETECTED NOT DETECTED Final   Parainfluenza Virus 1 NOT DETECTED NOT DETECTED Final   Parainfluenza Virus 2 NOT DETECTED NOT DETECTED Final   Parainfluenza Virus 3 NOT DETECTED NOT DETECTED Final   Parainfluenza Virus 4 NOT DETECTED NOT DETECTED Final   Respiratory Syncytial Virus NOT DETECTED NOT DETECTED Final   Bordetella pertussis NOT DETECTED NOT DETECTED Final   Chlamydophila pneumoniae NOT DETECTED NOT DETECTED Final   Mycoplasma pneumoniae NOT DETECTED NOT DETECTED Final    Comment: Performed at [facility], [place]. [address] [place], [place] [address]  MRSA PCR Screening     Status: None   Collection Time: 05/30/20 11:18 AM   Specimen: Nasal  Mucosa; Nasopharyngeal  Result Value Ref Range Status   MRSA by PCR NEGATIVE NEGATIVE Final    Comment:        The GeneXpert MRSA Assay (FDA approved for NASAL specimens only), is one component of a comprehensive MRSA colonization surveillance program. It is not intended to diagnose MRSA infection nor to guide or monitor treatment for MRSA infections. Performed at [facility], [place] [address] [place], [place] [address]       Studies:  CT CHEST WO CONTRAST  Result Date: 05/30/2020 CLINICAL DATA:  Inpatient. Respiratory failure. Worsening dyspnea. History of metastatic gastric cancer and prostate cancer. EXAM: CT CHEST WITHOUT CONTRAST TECHNIQUE: Multidetector CT imaging of the chest was performed following the standard protocol without IV contrast. COMPARISON:  Chest radiograph from one day prior. 05/25/2020 chest CT angiogram. FINDINGS: Cardiovascular: Top-normal heart size. Moderate pericardial effusion is unchanged. Left anterior descending coronary atherosclerosis. Right internal jugular Port-A-Cath terminates at the cavoatrial junction. Atherosclerotic nonaneurysmal thoracic aorta. Normal caliber pulmonary arteries. Mediastinum/Nodes: Coarsely calcified 1.1 cm left thyroid nodule is stable. Not clinically  significant; no follow-up imaging recommended (ref: J Am Coll Radiol. 2015 Feb;12(2): 143-50). Unremarkable esophagus. No axillary adenopathy. Coarsely calcified nonenlarged bilateral paratracheal, subcarinal, AP window and bilateral hilar lymph nodes compatible with prior granulomatous disease, unchanged. No pathologically enlarged noncalcified mediastinal or discrete hilar nodes on this noncontrast scan. Lungs/Pleura: No pneumothorax. Small bilateral pleural effusions, right greater than left, possibly loculated on the right, mildly increased bilaterally. There is extensive patchy consolidation and ground-glass opacity throughout both lungs involving all lung lobes,  most prominent in the perihilar right lung with associated volume loss, widespread moderate bronchiectasis and architectural distortion. The consolidation and ground-glass opacity is stable to minimally improved since recent 05/25/2020 chest CT angiogram study. These findings are largely new since 09/19/2018 chest CT study. Anterior left upper lobe solid 0.9 cm pulmonary nodule (series 5/image 56) is stable since 05/25/2020 chest CT and new since 09/19/2018 chest CT. Upper abdomen: Multiple indistinct confluent hypodense liver masses, largest 6.1 cm in the right liver (series 2/image 119), not well evaluated on this noncontrast study. Musculoskeletal: No aggressive appearing focal osseous lesions. Mild thoracic spondylosis. IMPRESSION: 1. Extensive patchy consolidation and ground-glass opacity throughout both lungs, stable to minimally improved since recent 05/25/2020 chest CT angiogram study. Widespread moderate bronchiectasis with some parenchymal distortion and volume loss. Findings are essentially new since 09/19/2018 chest CT study and suggest a severe multilobar pneumonia potentially superimposed on an underlying fibrotic interstitial lung disease. 2. Solid 0.9 cm anterior left upper lobe pulmonary nodule, stable since 05/25/2020 chest CT and new since 09/19/2018 chest CT, cannot exclude pulmonary metastasis. 3. Small bilateral pleural effusions, right greater than left, mildly increased bilaterally, potentially loculated on the right. 4. Moderate pericardial effusion, unchanged. 5. Multiple indistinct confluent hypodense liver masses, not well evaluated on this noncontrast study, compatible with known liver metastases. 6. One vessel coronary atherosclerosis. 7. Aortic Atherosclerosis (ICD10-I70.0). Electronically Signed   By: Ilona Sorrel M.D.   On: 05/30/2020 17:46    Assessment: 85 y.o. Peter Wood, [place] man status post gastric biopsy 09/20/2018 showing adenocarcinoma, with a CA 19-9 greater than 65,000;  staging studies showing an apparent mass adjacent to the head of the pancreas, innumerable liver lesions, upper abdominal mesenteric and peripancreatic lymphadenopathy, and an isolated sclerotic density in the left iliac bone             (a) genomics requested on gastric biopsy showed the tumor to be HER-2 amplified at 3+, PD-L1 positive, and T p53 mutated.  MSI is stable, mismatch repair status is proficient and the tumor mutational burden is intermediate.  (1) chest CT scan 09/19/2018 shows right lower lobe pulmonary embolism             (a) on intravenous heparin started 09/19/2018, transitioned to apixaban 10/01/2018  (2) genetics testing 10/04/2018: negative             (a) family history of prostate and early breast cancer; patient has a history of prostate cancer  (3) started gemcitabine/Abraxane 10/09/2018, repeated days 1, 8 and 15 of each 28-day cycle             (a) stopped after 1 cycle (3 doses) with reassessment of diagnosis based on CARIS results, noted above  (4) started oxaliplatin, capecitabine and trastuzumab 11/06/2018             (a) baseline echocardiogram 09/20/2018 shows an ejection fraction in the 55-60% range             (b) oxaliplatin and trastuzumab every 21  days started 11/06/2018             (c) capecitabine dose 1000 mg twice daily for 14 days of every 21-day cycle             (d) CT of the abdomen and pelvis 02/11/2019 shows marked improvement in his liver lesions and resolution of periportal adenopathy             (e) tumor markers also show significant response,             (f) echocardiogram 02/12/2019 shows an ejection fraction in the 60-65% range             (g) CT abd/pelvis show stable/ improved disease, tumor markers nearly normal             (h) oxaliplatin stopped aftwer 04/30/2019 dose             (i) capecitabine stopped after September cycle              (5) trastuzumab maintenance started 05/20/2019, last dose 07/22/2019, with  progression  (6) disease progression documented by lab work and CT scan 07/16/2019             (a) resumed FOLFOX 07/29/2019, continuing trastuzumab (every 14 days).             (b) restaging studies after 3 cycles showed evidence of progression (tumor markers equivocal)             (c) FOLFOX discontinued 08/26/2019  (7) started T-DM1 09/30/2019, repeat every 21 days             (a) pembrolizumab added 12/02/2019             (b) echocardiogram 08/25/2019 showed an ejection fraction in the 60-65% range             (c) repeat CT of the abdomen 12/11/2019 shows growth in 2 liver lesions  (8) started [facility] 12/23/2019 at 5.4 mg/kg.             (a) dose increased to 6.1 mg/kg with the third dose and two 6.4 (target dose) 03/17/2020                  (b) Changed to every 4 weeks after receiving 04/28/2020 dose (which was his most recent)  (9) iron deficiency anemia:             (a) received Feraheme 04/22 /2021 and 01/08/2020      (10) pneumonia/ pneumonitis?  (a) patchy infiltrates on CXR 04/28/2020   (i) COVID-19 negative   (ii) received antibodies as outpatient   (iii) s/p Z=pack  (b) worsening cough, SOB and fever   (i) CT angio 05/25/2020 no PE, worsening bilateral infiltrates c/w atypical infection   (ii) vancomycin, unasyn started 05/25/2020; vanco d/c'd after one dose   (iii) methylprednisolone started 05/27/2020   (iv) cytology from bronchial fluid 05/26/2020 negative for malignancy   Plan:  Peter Wood looks increasingly frail. He is not showing improvement on steroids so far. He appears malnourished and can become agitated which is very unlike him. Palliative care had a good discussion with him and his wife today.  I called Peter Wood after the visit and let her know my concern that we may be dealing with tumor spread to the lungs rather than inflammation. If Peter Wood's situation does not start turning around in the next 48-72 hours I am afraid that would be the default diagnosis.  In that  case Peter Wood tells me) he would want to go home. She is very competent and they have 3 children in [place] (a 4th in [place]).   Palliative suggested MSO4 and Peter Wood is reluctant to use it. I explained that pain is not the concern but a feeling of breathlessness and if that develops narcotics can be very useful.  I will continue to follow with you. Greatly appreciate your efforts on behalf of this patient and his family.   Chauncey Cruel, MD 05/31/2020  3:54 PM Medical Oncology and Hematology [facility] [address] [place], [place] [address] Tel. [phone]    Fax. [phone]

## 2020-06-01 ENCOUNTER — Inpatient Hospital Stay (HOSPITAL_COMMUNITY): Payer: Medicare PPO

## 2020-06-01 LAB — BASIC METABOLIC PANEL
Anion gap: 12 (ref 5–15)
BUN: 36 mg/dL — ABNORMAL HIGH (ref 8–23)
CO2: 32 mmol/L (ref 22–32)
Calcium: 9.2 mg/dL (ref 8.9–10.3)
Chloride: 103 mmol/L (ref 98–111)
Creatinine, Ser: 0.68 mg/dL (ref 0.61–1.24)
GFR calc Af Amer: >60 mL/min (ref >60)
GFR calc non Af Amer: >60 mL/min (ref >60)
Glucose, Bld: 133 mg/dL — ABNORMAL HIGH (ref 70–99)
Potassium: 4.2 mmol/L (ref 3.5–5.1)
Sodium: 147 mmol/L — ABNORMAL HIGH (ref 135–145)

## 2020-06-01 NOTE — Progress Notes (Addendum)
NAME:  Peter Wood, MRN:  [id number], DOB:  [birth year], LOS: 6 ADMISSION DATE:  05/25/2020, CONSULTATION DATE:  05/26/20 REFERRING MD: Dr. Maryland Pink, CHIEF COMPLAINT:  SOB, Fever   Brief History   85 y/o M admitted 9/14 with reports of shortness of breath and fever.  He has a history of gastric adenocarcinoma with metastatic disease to liver and bone (initial diagnosis 09/2018) s/p chemotherapy with slow progression on Enhertu (last dose 04/28/20).   He had a CXR on 8/18 that demonstrated bilateral infiltrates.  COVID testing was negative at that time.  However, he was treated with monoclonal antibody (casirivimab / imdevimab) on 8/19 followed by a course of azithromycin without significant improvement.    Past Medical History  HTN PE - on eliquis  Prostate Cancer - s/p radiation treatment  Gastric adenocarcinoma with metastatic disease to liver and bone, initial dx 09/2018  Significant Hospital Events   9/14 Admit 9/15 PCCM consulted for pulmonary evaluation  9/16 Steroids initiated  9/19 Worsening hypoxia, transfer to SDU 9/20 HFNC 15L + NRB  Consults:  PCCM  Procedures:    Significant Diagnostic Tests:  CTA Chest 9/15 >> negative for PE, extensive bilateral asymmetric airspace opacities, small right pleural effusion, small simple appearing pericardial effusion without CT evidence of tamponade, central pulmonary arterial enlargement FOB 9/15 >> clear airways, minimal secretions, scant bloody tinged secretions in LLL CT Chest w/o 9/19 >> extensive patchy consolidation and GGO throughout both lungs, stable to minimally improved since 9/14, widespread moderate bronchiectasis with some parenchymal distortion and volume loss - findings are essentially new since 09/2018 and suggest a severe multilobar PNA potentially superimposed on underlying fibrotic ILD, solid 0.9 cm anterior LUL pulmonary nodule, small bilateral pleural effusions R>L, moderate pericardial effusion, multiple indistinct  confluent hypodense liver masses compatible with known liver metastases  Micro Data:  COVID 9/14 >> negative  RVP 9/15 >> negative  BAL 9/15 >> negative  AFB 9/15 >> negative  PCP by DFA 9/15 >> negative  Cytology 9/15 >> benign bronchial cells, no malignant cells identified  Antimicrobials:  Unasyn 9/15 >> 9/19 Cefepime 9/19 >> 9/21  Interim history/subjective:  Afebrile  Pt reports breathing is "about the same as yesterday but I feel ok" Remains on 15L salter + 15L NRB   Objective   Blood pressure 138/90, pulse 78, temperature 97.9 F (36.6 C), temperature source Axillary, resp. rate (!) 24, height _0  (1.626 m), weight 61.2 kg, SpO2 100 %.        Intake/Output Summary (Last 24 hours) at 06/01/2020 0814 Last data filed at 06/01/2020 0606 Gross per 24 hour  Intake 410 ml  Output 2300 ml  Net -1890 ml   Filed Weights   05/25/20 1744 05/26/20 1234  Weight: 61.2 kg 61.2 kg    Examination: General: pleasant elderly male lying in bed in NAD HEENT: MM pink/moist, NRB + salter O2, wearing glasses Neuro: AAOx4, speech clear, MAE / generalized weakness  CV: s1s2 RRR, no m/r/g PULM: non-labored on oxygen, lungs bilaterally with bronchial breath sounds anteriorly, crackles posterior bases GI: soft, bsx4 active  Extremities: warm/dry, 1+ BLE edema  Skin: no rashes or lesions  Resolved Hospital Problem list      Assessment & Plan:    Acute Hypoxic Respiratory Failure in setting of Diffuse Bilateral Infiltrates  Fever  Hx Pulmonary Embolism on Eliquis  Recent hx of negative COVID testing, however treated with monoclonal antibodies on 8/19 & azithromycin.  Differential dx includes viral / bacterial  infection, component pulmonary edema with noted central pulmonary arterial enlargement, BOOP/COP, pneumonitis / ILD associated with trastuzumab and lymphangitic spread of malignancy. CT angio negative for PE and he is on chronic anticoagulation for prior PE. Minimal secretions on  bronchoscopy. Suspect most likely dx is pneumonitis vs malignant spread. FOB negative for bacterial, AFB, PCP.   -continue home eliquis for hx of PE -solumedrol 60 mg IV Q6, hold reduction for now -continue cefepime D7/7 abx total, D3 of cefepime.  Plan to stop abx after 9/21 dosing.  -concern that given lack of response to steroids that this may be malignant spread -push pulmonary hygiene - IS, mobilize as able -follow intermittent CXR -PRN low dose morphine for air hunger   Severe Protein Calorie Malnutrition -diet as tolerated   Goals of care Prior discussion with patient > clearly stated that he would not want intubation or CPR.  Follow up discussion with Palliative Care and he confirms DNR.  Family aware of changes.  -wife indicates if he does not have a response to therapy, he would want to go home for hospice care     Best practice:  Diet: As tolerated, per primary  Pain/Anxiety/Delirium protocol (if indicated): n/a  VAP protocol (if indicated): n/a  DVT prophylaxis: eliquis  GI prophylaxis: per primary  Glucose control: per primary  Mobility: as tolerated  Code Status: DNR Family Communication: Per primary  Disposition: Per primary   Labs   CBC: Recent Labs  Lab 05/25/20 1552 05/26/20 0448 05/27/20 0304 05/28/20 0420 05/31/20 0429  WBC 10.0 11.4* 15.2* 7.5 11.7*  NEUTROABS 7.8*  --   --   --   --   HGB 10.4* 9.7* 9.4* 9.1* 8.9*  HCT 31.3* 29.6* 28.0* 27.8* 28.5*  MCV 101.6* 104.2* 102.9* 103.3* 105.9*  PLT 318 305 303 329 062    Basic Metabolic Panel: Recent Labs  Lab 05/28/20 0420 05/29/20 0331 05/30/20 0338 05/31/20 0429 06/01/20 0333  NA 142 141 146* 144 147*  K 3.8 3.8 4.1 3.9 4.2  CL 101 102 104 104 103  CO2 _0 32  GLUCOSE 153* 127* 121* 110* 133*  BUN 24* 31* 32* 29* 36*  CREATININE 0.72 0.68 0.66 0.62 0.68  CALCIUM 8.7* 8.8* 9.2 8.9 9.2  MG  --  2.3  --   --   --    GFR: Estimated Creatinine Clearance: 59.6 mL/min (by C-G  formula based on SCr of 0.68 mg/dL). Recent Labs  Lab 05/25/20 1805 05/25/20 2130 05/26/20 0448 05/27/20 0304 05/27/20 0305 05/28/20 0420 05/31/20 0429  PROCALCITON 1.96  --   --   --  11.09  --   --   WBC  --   --  11.4* 15.2*  --  7.5 11.7*  LATICACIDVEN 1.4 1.3  --   --   --   --   --     Liver Function Tests: Recent Labs  Lab 05/26/20 0448 05/27/20 0304 05/28/20 0420 05/29/20 0331 05/31/20 0429  AST 79* 52* 54* 119* 84*  ALT 63* 46* 43 71* 83*  ALKPHOS 386* 358* 374* 463* 412*  BILITOT 0.9 1.5* 1.2 0.9 1.3*  PROT 6.1* 5.6* 5.9* 5.8* 5.6*  ALBUMIN 2.2* 2.0* 1.8* 2.1* 2.1*   No results for input(s): LIPASE, AMYLASE in the last 168 hours. No results for input(s): AMMONIA in the last 168 hours.  ABG No results found for: PHART, PCO2ART, PO2ART, HCO3, TCO2, ACIDBASEDEF, O2SAT   Coagulation Profile: Recent Labs  Lab 05/26/20  0448 05/27/20 0304 05/28/20 0420  INR 1.5* 1.6* 1.8*    Cardiac Enzymes: No results for input(s): CKTOTAL, CKMB, CKMBINDEX, TROPONINI in the last 168 hours.  HbA1C: No results found for: HGBA1C  CBG: No results for input(s): GLUCAP in the last 168 hours.     Critical care time: n/a     Noe Gens, MSN, NP-C [facility] 06/01/2020, 8:14 AM   Please see [email or web address] for pager details.

## 2020-06-01 NOTE — Progress Notes (Signed)
Pt alert and aware sitting up in bed. Pt states he is doing pretty good.  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  He has family in the area.  The chaplain offered caring and supportive presence. The chaplain played a devotion for pt. He read scripture and sang sacred music, and prayed with pt. Further visits will be offered.

## 2020-06-01 NOTE — Progress Notes (Addendum)
PROGRESS NOTE    Peter Wood  ZOX:[id number]  DOB: 03/18/1935  PCP: Cassandria Anger, MD Admit date:05/25/2020 Chief compliant: Shortness of breath and fever 85 y.o.malewithHTN, PE 2020, gastric adenocarcinomawith mets to liver and boneDx 1/2020s/p chemotherapy with good response initially but slow progression (more recently onEnhertulast received 04/28/2020), wasreferred from oncology clinic for evaluation of persistent productive cough with yellow phlegm, fever and dyspnea. CXR 04/28/2020 with bilateral infiltrates however Covid PCR was negative.Despite negative Covid test, given high risk, he received monoclonal antibody casirivimab/imdevimab8/19/2021 followed by a course of azithromycin without significant improvement. ED Course: T-max 101.5 per patient report, 133/76, pulse 101, RR 20-27, SPO2 88-96% room air.  Labs showed sodium 133, [name] 533, ALT 86, AST 116, WBC 10 K, hemoglobin 10.4, BNP 54, Covid PCR negative.  Chest x-ray showed persistent multifocal airspace opacities with differentials including atypical pneumonia versus lymphangitic spread of tumor.  CTA chest obtained in the ED without evidence of PE but extensive bilateral asymmetric airspace opacities and evidence of pulmonary hypertension.  Received IV vancomycin, Unasyn in the ED. Hospital course: Patient admitted to [facility] for further evaluation and management with supplemental O2, pulmonary consultation for possible bronchoscopy.  Eliquis held in anticipation of procedure.  Elevated LFTs felt to be secondary to metastatic disease/cholestasis. IV Solu-Medrol started by pulmonology to cover enhertu induced  Pneumonitis.  Patient underwent bronc on 9/15 which showed changes consistent with lung injury due to smoking.  On 9/19 patient noted to be severely hypoxic after using the bathroom with O2 sat dropped to 36%.  Patient placed on NRB M with O2 sat picking up to 80%.  PCCM recommended transfer to stepdown unit.  CODE  STATUS changed to DNR.  Palliative care consulted.  Subjective:  Patient remains in SDU and on high flow O2 . Chaplain at bedside discussing with patient.  He appears somewhat dyspneic on talking full sentences.  Subjectively feels the same as yesterday.    Objective: Vitals:   06/01/20 1200 06/01/20 1300 06/01/20 1352 06/01/20 1400  BP:    133/77  Pulse: 73 89  82  Resp: (!) 24 (!) 23  (!) 22  Temp: (!) 96.6 F (35.9 C)     TempSrc: Oral     SpO2: 100% 94% 100% 100%  Weight:      Height:        Intake/Output Summary (Last 24 hours) at 06/01/2020 1444 Last data filed at 06/01/2020 1200 Gross per 24 hour  Intake 591.47 ml  Output 1150 ml  Net -558.53 ml   Filed Weights   05/25/20 1744 05/26/20 1234  Weight: 61.2 kg 61.2 kg    Physical Examination:  General: Thin built, appears somewhat dyspneic on talking full sentences Head ENT: Atraumatic normocephalic, PERRLA, neck supple Heart: S1-S2 heard, regular rate and rhythm, no murmurs.  No leg edema noted Lungs: Equal air entry bilaterally, no rhonchi or rales on exam, no accessory muscle use at rest but somewhat dyspneic on talking full sentences Abdomen: Bowel sounds heard, soft, nontender, nondistended. No organomegaly.  No CVA tenderness Extremities: No pedal edema.  No cyanosis or clubbing. Neurological: Awake alert oriented x3, no focal weakness or numbness, strength and sensations to crude touch intact Skin: No wounds or rashes.    Data Reviewed: I have personally reviewed following labs and imaging studies  CBC: Recent Labs  Lab 05/25/20 1552 05/26/20 0448 05/27/20 0304 05/28/20 0420 05/31/20 0429  WBC 10.0 11.4* 15.2* 7.5 11.7*  NEUTROABS 7.8*  --   --   --   --  HGB 10.4* 9.7* 9.4* 9.1* 8.9*  HCT 31.3* 29.6* 28.0* 27.8* 28.5*  MCV 101.6* 104.2* 102.9* 103.3* 105.9*  PLT 318 305 303 329 397   Basic Metabolic Panel: Recent Labs  Lab 05/28/20 0420 05/29/20 0331 05/30/20 0338 05/31/20 0429  06/01/20 0333  NA 142 141 146* 144 147*  K 3.8 3.8 4.1 3.9 4.2  CL 101 102 104 104 103  CO2 _0 32  GLUCOSE 153* 127* 121* 110* 133*  BUN 24* 31* 32* 29* 36*  CREATININE 0.72 0.68 0.66 0.62 0.68  CALCIUM 8.7* 8.8* 9.2 8.9 9.2  MG  --  2.3  --   --   --    GFR: Estimated Creatinine Clearance: 59.6 mL/min (by C-G formula based on SCr of 0.68 mg/dL). Liver Function Tests: Recent Labs  Lab 05/26/20 0448 05/27/20 0304 05/28/20 0420 05/29/20 0331 05/31/20 0429  AST 79* 52* 54* 119* 84*  ALT 63* 46* 43 71* 83*  ALKPHOS 386* 358* 374* 463* 412*  BILITOT 0.9 1.5* 1.2 0.9 1.3*  PROT 6.1* 5.6* 5.9* 5.8* 5.6*  ALBUMIN 2.2* 2.0* 1.8* 2.1* 2.1*   No results for input(s): LIPASE, AMYLASE in the last 168 hours. No results for input(s): AMMONIA in the last 168 hours. Coagulation Profile: Recent Labs  Lab 05/26/20 0448 05/27/20 0304 05/28/20 0420  INR 1.5* 1.6* 1.8*   Cardiac Enzymes: No results for input(s): CKTOTAL, CKMB, CKMBINDEX, TROPONINI in the last 168 hours. BNP (last 3 results) No results for input(s): PROBNP in the last 8760 hours. HbA1C: No results for input(s): HGBA1C in the last 72 hours. CBG: No results for input(s): GLUCAP in the last 168 hours. Lipid Profile: No results for input(s): CHOL, HDL, LDLCALC, TRIG, CHOLHDL, LDLDIRECT in the last 72 hours. Thyroid Function Tests: No results for input(s): TSH, T4TOTAL, FREET4, T3FREE, THYROIDAB in the last 72 hours. Anemia Panel: No results for input(s): VITAMINB12, FOLATE, FERRITIN, TIBC, IRON, RETICCTPCT in the last 72 hours. Sepsis Labs: Recent Labs  Lab 05/25/20 1805 05/25/20 2130 05/27/20 0305  PROCALCITON 1.96  --  11.09  LATICACIDVEN 1.4 1.3  --     Recent Results (from the past 240 hour(s))  Culture, Blood     Status: None   Collection Time: 05/25/20  3:52 PM   Specimen: BLOOD  Result Value Ref Range Status   Specimen Description BLOOD LEFT ANTECUBITAL  Final   Special Requests   Final     BOTTLES DRAWN AEROBIC AND ANAEROBIC Blood Culture results may not be optimal due to an excessive volume of blood received in culture bottles   Culture   Final    NO GROWTH 5 DAYS Performed at [facility], [place] [address], [place], [place] [address]    Report Status 05/30/2020 FINAL  Final  Culture, Blood     Status: None   Collection Time: 05/25/20  4:48 PM   Specimen: BLOOD  Result Value Ref Range Status   Specimen Description BLOOD PORTA CATH  Final   Special Requests   Final    BOTTLES DRAWN AEROBIC AND ANAEROBIC Blood Culture adequate volume   Culture   Final    NO GROWTH 5 DAYS Performed at [facility], [place] [address], [place], [place] [address]    Report Status 05/30/2020 FINAL  Final  SARS Coronavirus 2 by RT PCR (hospital order, performed in [facility] hospital lab) Nasopharyngeal Nasopharyngeal Swab     Status: None   Collection Time: 05/25/20  6:05  PM   Specimen: Nasopharyngeal Swab  Result Value Ref Range Status   SARS Coronavirus 2 NEGATIVE NEGATIVE Final    Comment: (NOTE) SARS-CoV-2 target nucleic acids are NOT DETECTED.  The SARS-CoV-2 RNA is generally detectable in upper and lower respiratory specimens during the acute phase of infection. The lowest concentration of SARS-CoV-2 viral copies this assay can detect is 250 copies / mL. A negative result does not preclude SARS-CoV-2 infection and should not be used as the sole basis for treatment or other patient management decisions.  A negative result may occur with improper specimen collection / handling, submission of specimen other than nasopharyngeal swab, presence of viral mutation(s) within the areas targeted by this assay, and inadequate number of viral copies (<250 copies / mL). A negative result must be combined with clinical observations, patient history, and epidemiological information.  Fact Sheet for Patients:   StrictlyIdeas.no  Fact Sheet for Healthcare  Providers: BankingDealers.co.za  This test is not yet approved or  cleared by the [place] FDA and has been authorized for detection and/or diagnosis of SARS-CoV-2 by FDA under an Emergency Use Authorization (EUA).  This EUA will remain in effect (meaning this test can be used) for the duration of the COVID-19 declaration under Section 564(b)(1) of the Act, 21 U.S.C. section 360bbb-3(b)(1), unless the authorization is terminated or revoked sooner.  Performed at [facility], [place] [address], [place], [place] [address]   MRSA PCR Screening     Status: None   Collection Time: 05/25/20 11:45 PM   Specimen: Nasal Mucosa; Nasopharyngeal  Result Value Ref Range Status   MRSA by PCR NEGATIVE NEGATIVE Final    Comment:        The GeneXpert MRSA Assay (FDA approved for NASAL specimens only), is one component of a comprehensive MRSA colonization surveillance program. It is not intended to diagnose MRSA infection nor to guide or monitor treatment for MRSA infections. Performed at [facility], [place] [address] [place], [place] [address]   Acid Fast Smear (AFB)     Status: None   Collection Time: 05/26/20  1:26 PM   Specimen: Bronchial Alveolar Lavage  Result Value Ref Range Status   AFB Specimen Processing Concentration  Final   Acid Fast Smear Negative  Final    Comment: (NOTE) Performed At: [facility] [place], [place] [id number] Rush Farmer MD EU:[id number]    Source (AFB) BRONCHIAL ALVEOLAR LAVAGE  Final    Comment: RML Performed at [facility], [place] [address], [place], [place] [address]   Pneumocystis smear by DFA     Status: None   Collection Time: 05/26/20  1:26 PM   Specimen: Bronchial Alveolar Lavage; Respiratory  Result Value Ref Range Status   Specimen Source-PJSRC BRONCHIAL ALVEOLAR LAVAGE  Final    Comment: RML   Pneumocystis jiroveci Ag NEGATIVE  Final     Comment: Performed at [place] Performed at [place] [address] [place], [place] [address]   Culture, respiratory     Status: None   Collection Time: 05/26/20  1:37 PM   Specimen: Bronchoalveolar Lavage; Respiratory  Result Value Ref Range Status   Specimen Description   Final    BRONCHIAL ALVEOLAR LAVAGE RML Performed at [place] [address] [place], [place] [address]    Special Requests   Final    NONE Performed at [facility], [address] [place], [place] [address]    Gram Stain   Final    MODERATE WBC PRESENT, PREDOMINANTLY MONONUCLEAR NO ORGANISMS SEEN    Culture   Final    NO GROWTH 2 DAYS Performed at [place] [address] [place], [place] [address]    Report Status 05/29/2020 FINAL  Final  Acid Fast Smear (AFB)     Status: None   Collection Time: 05/26/20  1:53 PM   Specimen: Bronchial Alveolar Lavage  Result Value Ref Range Status   AFB Specimen Processing Concentration  Final   Acid Fast Smear Negative  Final    Comment: (NOTE) Performed At: [facility] [place], [place] [id number] Rush Farmer MD HW:[id number]    Source (AFB) BRONCHIAL ALVEOLAR LAVAGE  Final    Comment: RUL Performed at [facility], [place] [address], [place], [place] [address]   Culture, respiratory     Status: None   Collection Time: 05/26/20  1:54 PM   Specimen: Bronchoalveolar Lavage; Respiratory  Result Value Ref Range Status   Specimen Description   Final    BRONCHIAL ALVEOLAR LAVAGE RUL Performed at [place] [address] [place], [place] [address]    Special Requests   Final    NONE Performed at [facility], [place] [address] [place], [place] [address]    Gram Stain   Final    MODERATE WBC PRESENT, PREDOMINANTLY MONONUCLEAR NO ORGANISMS SEEN    Culture   Final    NO GROWTH 2  DAYS Performed at [place] [address] [place],  [address]    Report Status 05/29/2020 FINAL  Final  Pneumocystis smear by DFA     Status: None   Collection Time: 05/26/20  1:54 PM   Specimen: Bronchial Alveolar Lavage; Respiratory  Result Value Ref Range Status   Specimen Source-PJSRC BRONCHIAL ALVEOLAR LAVAGE  Final    Comment: RUL   Pneumocystis jiroveci Ag NEGATIVE  Final    Comment: Performed at [facility] Performed at [facility], [place] [address], [place],  [address]   Respiratory Panel by PCR     Status: None   Collection Time: 05/26/20  6:19 PM   Specimen: Nasopharyngeal Swab; Respiratory  Result Value Ref Range Status   Adenovirus NOT DETECTED NOT DETECTED Final   Coronavirus 229E NOT DETECTED NOT DETECTED Final    Comment: (NOTE) The Coronavirus on the Respiratory Panel, DOES NOT test for the novel  Coronavirus (2019 nCoV)    Coronavirus HKU1 NOT DETECTED NOT DETECTED Final   Coronavirus NL63 NOT DETECTED NOT DETECTED Final   Coronavirus OC43 NOT DETECTED NOT DETECTED Final   Metapneumovirus NOT DETECTED NOT DETECTED Final   Rhinovirus / Enterovirus NOT DETECTED NOT DETECTED Final   Influenza A NOT DETECTED NOT DETECTED Final   Influenza B NOT DETECTED NOT DETECTED Final   Parainfluenza Virus 1 NOT DETECTED NOT DETECTED Final   Parainfluenza Virus 2 NOT DETECTED NOT DETECTED Final   Parainfluenza Virus 3 NOT DETECTED NOT DETECTED Final   Parainfluenza Virus 4 NOT DETECTED NOT DETECTED Final   Respiratory Syncytial Virus NOT DETECTED NOT DETECTED Final   Bordetella pertussis NOT DETECTED NOT DETECTED Final   Chlamydophila pneumoniae NOT DETECTED NOT DETECTED Final   Mycoplasma pneumoniae NOT DETECTED NOT DETECTED Final    Comment: Performed at [facility], [place]. [address], [place],  [address]  MRSA PCR Screening     Status: None   Collection Time: 05/30/20 11:18 AM   Specimen: Nasal  Mucosa; Nasopharyngeal  Result Value Ref Range Status   MRSA by PCR NEGATIVE NEGATIVE Final    Comment:        The GeneXpert MRSA Assay (FDA approved for NASAL specimens only), is one component of a comprehensive MRSA colonization surveillance program. It is not intended to diagnose MRSA infection nor to guide or monitor treatment for MRSA infections. Performed at [facility], [place] [address] [place], [place] [address]       Radiology Studies: CT CHEST WO CONTRAST  Result Date: 05/30/2020 CLINICAL DATA:  Inpatient. Respiratory failure. Worsening dyspnea. History of metastatic gastric cancer and prostate cancer. EXAM: CT CHEST WITHOUT CONTRAST TECHNIQUE: Multidetector CT imaging of the chest was performed following the standard protocol without IV contrast. COMPARISON:  Chest radiograph from one day prior. 05/25/2020 chest CT angiogram. FINDINGS: Cardiovascular: Top-normal heart size. Moderate pericardial effusion is unchanged. Left anterior descending coronary atherosclerosis. Right internal jugular Port-A-Cath terminates at the cavoatrial junction. Atherosclerotic nonaneurysmal thoracic aorta. Normal caliber pulmonary arteries. Mediastinum/Nodes: Coarsely calcified 1.1 cm left thyroid nodule is stable. Not clinically significant; no follow-up imaging recommended (ref: J Am Coll Radiol. 2015 Feb;12(2): 143-50). Unremarkable esophagus. No axillary adenopathy. Coarsely calcified nonenlarged bilateral paratracheal, subcarinal, AP window and bilateral hilar lymph nodes compatible with prior granulomatous disease, unchanged. No pathologically enlarged noncalcified mediastinal or discrete hilar nodes on this noncontrast scan. Lungs/Pleura: No pneumothorax. Small bilateral pleural effusions, right greater than left, possibly loculated on the right, mildly increased bilaterally. There is extensive patchy consolidation and ground-glass opacity throughout both lungs involving all lung  lobes, most prominent in the perihilar right lung with associated volume loss, widespread moderate bronchiectasis and architectural distortion. The consolidation and ground-glass opacity is stable to minimally improved since recent 05/25/2020 chest CT angiogram study. These findings are largely new since 09/19/2018 chest CT study. Anterior left upper lobe solid 0.9 cm pulmonary nodule (series 5/image 56) is stable since 05/25/2020 chest CT and new since 09/19/2018 chest CT. Upper abdomen: Multiple indistinct confluent hypodense liver masses, largest 6.1 cm in the right liver (series 2/image 119), not well evaluated on this noncontrast study. Musculoskeletal: No aggressive appearing focal osseous lesions. Mild thoracic spondylosis. IMPRESSION: 1. Extensive patchy consolidation and ground-glass opacity throughout both lungs, stable to minimally improved since recent 05/25/2020 chest CT angiogram study. Widespread moderate bronchiectasis with some parenchymal distortion and volume loss. Findings are essentially new since 09/19/2018 chest CT study and suggest a severe multilobar pneumonia potentially superimposed on an underlying fibrotic interstitial lung disease. 2. Solid 0.9 cm anterior left upper lobe pulmonary nodule, stable since 05/25/2020 chest CT and new since 09/19/2018 chest CT, cannot exclude pulmonary metastasis. 3. Small bilateral pleural effusions, right greater than left, mildly increased bilaterally, potentially loculated on the right. 4. Moderate pericardial effusion, unchanged. 5. Multiple indistinct confluent hypodense liver masses, not well evaluated on this noncontrast study, compatible with known liver metastases. 6. One vessel coronary atherosclerosis. 7. Aortic Atherosclerosis (ICD10-I70.0). Electronically Signed   By: Ilona Sorrel M.D.   On: 05/30/2020 17:46   DG CHEST PORT 1 VIEW  Result Date: 06/01/2020 CLINICAL DATA:  Hypoxia EXAM: PORTABLE CHEST 1 VIEW COMPARISON:  Chest radiograph  May 29, 2020 and chest CT May 30, 2020 FINDINGS: Multifocal airspace opacity consistent with multifocal pneumonia is again noted. Areas of underlying fibrotic change also present, stable. Small right pleural effusion evident. Heart size  and pulmonary vascularity are normal. No adenopathy. Port-A-Cath tip is in the superior vena cava. No pneumothorax. Degenerative change noted in each shoulder. IMPRESSION: Persistent multifocal airspace opacity with underlying fibrosis. No new opacity evident. Small right pleural effusion. Stable cardiac silhouette. Port-A-Cath tip in superior vena cava. Electronically Signed   By: Lowella Grip III M.D.   On: 06/01/2020 09:08      Scheduled Meds: . amLODipine  2.5 mg Oral Daily  . apixaban  2.5 mg Oral BID  . chlorhexidine  15 mL Mouth Rinse BID  . Chlorhexidine Gluconate Cloth  6 each Topical Daily  . docusate sodium  200 mg Oral Daily  . lactose free nutrition  237 mL Oral TID WC  . latanoprost  1 drop Both Eyes QHS  . mouth rinse  15 mL Mouth Rinse q12n4p  . methylPREDNISolone (SOLU-MEDROL) injection  60 mg Intravenous Q6H  . metoprolol tartrate  12.5 mg Oral BID  . multivitamin with minerals  1 tablet Oral Daily  . polyethylene glycol  17 g Oral Daily   Continuous Infusions: . ceFEPime (MAXIPIME) IV Stopped (06/01/20 1430)      Assessment/Plan:  1.  Acute hypoxic respiratory failure: Secondary to infectious process versus ILD associated with trastuzumab versus lymphangitic spread of malignancy versus pulmonary edema.AFB, BAL cultures so far negative.  PCP by DFA on 9/15 -ve.  No malignant cells identified on bronch/cytology.  Seen by PCCM and receiving Lasix trial (40 mg IV x1).  Resumed on Eliquis for recent history of PE.  PCCM recommends continuing IV cefepime-day 7 of abx, .  Also on IV Solu-Medrol 60 mg every 6 hours.  Taper O2 as tolerated, currently on high flow 15 L as well as NRB M as needed.  Appreciate PCCM follow-up and  recommendations.  2.  Chronic combined systolic, diastolic CHF: Clinically does not appear to be volume overloaded.  BNP 50-70.   PCCM gave Lasix trial yesterday due to increased O2 requirements, but no significant change.  Continue low-dose beta-blockers  3.  History of PE (diagnosed in 09/2018): In the setting of gastric adenocarcinoma diagnosis.  Resumed on Eliquis after bronchoscopy.  4.  Hypertension, uncontrolled: Norvasc and Lotensin were discontinued for low normal blood pressure on 9/19.  BP up in systolic 856D yesterday.  Continue Lopressor.  Resumed Norvasc at lower dose. SBP now 130s  5.  NSVT: Isolated episode on the night of 9/16-17.  Electrolytes replaced.  Lopressor started.  Continue to monitor telemetry and electrolytes.  6.  Mild hypernatremia: Likely secondary to IV diuresis yesterday.  Monitor for now.  Will avoid IV hydration but encourage oral fluid intake for 24 hours.  Labs in AM.  DVT prophylaxis: On Eliquis Code Status: DNR Family / Patient Communication: Palliative care team in discussions with patient and family.  Patient understands that he if he does not turn around in the next 48 hours with steroids, and if unable to wean O2, likely presentation due to lymphangitic cancer spread and he would consider alternative care goals. Disposition Plan:   Status is: Inpatient  Remains inpatient appropriate because:IV treatments appropriate due to intensity of illness or inability to take PO and Inpatient level of care appropriate due to severity of illness   Dispo: The patient is from: Home              Anticipated d/c is to: To be decided              Anticipated d/c date is: >  3 days              Patient currently is not medically stable to d/c.           Time spent: 25 minutes     >50% time spent in discussions with care team and coordination of care.    Guilford Shi, MD [facility] Pager in [employer]  If 7PM-7AM, please contact  night-coverage [email or web address] 06/01/2020, 2:44 PM

## 2020-06-02 LAB — BASIC METABOLIC PANEL
Anion gap: 10 (ref 5–15)
BUN: 37 mg/dL — ABNORMAL HIGH (ref 8–23)
CO2: 31 mmol/L (ref 22–32)
Calcium: 9.1 mg/dL (ref 8.9–10.3)
Chloride: 102 mmol/L (ref 98–111)
Creatinine, Ser: 0.54 mg/dL — ABNORMAL LOW (ref 0.61–1.24)
GFR calc Af Amer: >60 mL/min (ref >60)
GFR calc non Af Amer: >60 mL/min (ref >60)
Glucose, Bld: 124 mg/dL — ABNORMAL HIGH (ref 70–99)
Potassium: 4.1 mmol/L (ref 3.5–5.1)
Sodium: 143 mmol/L (ref 135–145)

## 2020-06-02 MED ORDER — METHYLPREDNISOLONE SODIUM SUCC 125 MG IJ SOLR
60.0000 mg | Freq: Two times a day (BID) | INTRAMUSCULAR | Status: DC
  Administered 2020-06-05 – 2020-06-06 (×2): 60 mg via INTRAVENOUS
  Filled 2020-06-02 (×2): qty 2

## 2020-06-02 MED ORDER — PREDNISONE 20 MG PO TABS: 60.0000 mg | ORAL_TABLET | Freq: Every day | ORAL | Status: DC

## 2020-06-02 MED ORDER — METHYLPREDNISOLONE SODIUM SUCC 125 MG IJ SOLR
60.0000 mg | Freq: Three times a day (TID) | INTRAMUSCULAR | Status: AC
  Administered 2020-06-02 – 2020-06-05 (×9): 60 mg via INTRAVENOUS
  Filled 2020-06-02 (×9): qty 2

## 2020-06-02 NOTE — Progress Notes (Signed)
Peter Wood   DOB:[birth year]   WG#:[id number]   VHQ#:[id number]  Subjective:  Jaquay tells me he is "about the same." He does not think his breathing is any better. He denies cough, phlegm, or pleurisy. Becomes SOB with any activity. Remains bed bound as a results. No pain. Not eating much. No family in room but I spoke w wife [name] extensively by phone and am awaiting a call from son (who lives in [place])   Objective: older African American man examined in bed Vitals:   06/02/20 0600 06/02/20 0700  BP: (!) 155/90 (!) 167/97  Pulse:    Resp: 14 (!) 21  Temp:    SpO2:      Body mass index is 23.17 kg/m.  Intake/Output Summary (Last 24 hours) at 06/02/2020 0834 Last data filed at 06/02/2020 0600 Gross per 24 hour  Intake 674.44 ml  Output 1100 ml  Net -425.56 ml    Olaf is alert, well-oriented. Sitting up abut to try to eat breakfast.   CBG (last 3)  No results for input(s): GLUCAP in the last 72 hours.   Labs:  Lab Results  Component Value Date   WBC 11.7 (H) 05/31/2020   HGB 8.9 (L) 05/31/2020   HCT 28.5 (L) 05/31/2020   MCV 105.9 (H) 05/31/2020   PLT 297 05/31/2020   NEUTROABS 7.8 (H) 05/25/2020    _0 @  Urine Studies No results for input(s): UHGB, CRYS in the last 72 hours.  Invalid input(s): UACOL, UAPR, USPG, UPH, UTP, UGL, UKET, UBIL, UNIT, UROB, ULEU, UEPI, UWBC, URBC, UBAC, [place], [place], [place]  Basic Metabolic Panel: Recent Labs  Lab 05/29/20 0331 05/29/20 0331 05/30/20 4132 05/30/20 0338 05/31/20 0429 05/31/20 0429 06/01/20 0333 06/02/20 0513  NA 141  --  146*  --  144  --  147* 143  K 3.8   < > 4.1   < > 3.9   < > 4.2 4.1  CL 102  --  104  --  104  --  103 102  CO2 29  --  31  --  31  --  32 31  GLUCOSE 127*  --  121*  --  110*  --  133* 124*  BUN 31*  --  32*  --  29*  --  36* 37*  CREATININE 0.68  --  0.66  --  0.62  --  0.68 0.54*  CALCIUM 8.8*  --  9.2  --  8.9  --  9.2 9.1  MG 2.3  --   --   --   --   --   --   --    < > =  values in this interval not displayed.   GFR Estimated Creatinine Clearance: 59.6 mL/min (A) (by C-G formula based on SCr of 0.54 mg/dL (L)). Liver Function Tests: Recent Labs  Lab 05/27/20 0304 05/28/20 0420 05/29/20 0331 05/31/20 0429  AST 52* 54* 119* 84*  ALT 46* 43 71* 83*  ALKPHOS 358* 374* 463* 412*  BILITOT 1.5* 1.2 0.9 1.3*  PROT 5.6* 5.9* 5.8* 5.6*  ALBUMIN 2.0* 1.8* 2.1* 2.1*   No results for input(s): LIPASE, AMYLASE in the last 168 hours. No results for input(s): AMMONIA in the last 168 hours. Coagulation profile Recent Labs  Lab 05/27/20 0304 05/28/20 0420  INR 1.6* 1.8*    CBC: Recent Labs  Lab 05/27/20 0304 05/28/20 0420 05/31/20 0429  WBC 15.2* 7.5 11.7*  HGB 9.4* 9.1* 8.9*  HCT 28.0* 27.8* 28.5*  MCV 102.9* 103.3* 105.9*  PLT 303 329 297   Cardiac Enzymes: No results for input(s): CKTOTAL, CKMB, CKMBINDEX, TROPONINI in the last 168 hours. BNP: Invalid input(s): POCBNP CBG: No results for input(s): GLUCAP in the last 168 hours. D-Dimer No results for input(s): DDIMER in the last 72 hours. Hgb A1c No results for input(s): HGBA1C in the last 72 hours. Lipid Profile No results for input(s): CHOL, HDL, LDLCALC, TRIG, CHOLHDL, LDLDIRECT in the last 72 hours. Thyroid function studies No results for input(s): TSH, T4TOTAL, T3FREE, THYROIDAB in the last 72 hours.  Invalid input(s): FREET3 Anemia work up No results for input(s): VITAMINB12, FOLATE, FERRITIN, TIBC, IRON, RETICCTPCT in the last 72 hours. Microbiology Recent Results (from the past 240 hour(s))  Culture, Blood     Status: None   Collection Time: 05/25/20  3:52 PM   Specimen: BLOOD  Result Value Ref Range Status   Specimen Description BLOOD LEFT ANTECUBITAL  Final   Special Requests   Final    BOTTLES DRAWN AEROBIC AND ANAEROBIC Blood Culture results may not be optimal due to an excessive volume of blood received in culture bottles   Culture   Final    NO GROWTH 5 DAYS Performed  at [facility], [place] [address] [place], [place] [address]    Report Status 05/30/2020 FINAL  Final  Culture, Blood     Status: None   Collection Time: 05/25/20  4:48 PM   Specimen: BLOOD  Result Value Ref Range Status   Specimen Description BLOOD PORTA CATH  Final   Special Requests   Final    BOTTLES DRAWN AEROBIC AND ANAEROBIC Blood Culture adequate volume   Culture   Final    NO GROWTH 5 DAYS Performed at [facility], [place] [address] [place], [place] [address]    Report Status 05/30/2020 FINAL  Final  SARS Coronavirus 2 by RT PCR (hospital order, performed in [facility] hospital lab) Nasopharyngeal Nasopharyngeal Swab     Status: None   Collection Time: 05/25/20  6:05 PM   Specimen: Nasopharyngeal Swab  Result Value Ref Range Status   SARS Coronavirus 2 NEGATIVE NEGATIVE Final    Comment: (NOTE) SARS-CoV-2 target nucleic acids are NOT DETECTED.  The SARS-CoV-2 RNA is generally detectable in upper and lower respiratory specimens during the acute phase of infection. The lowest concentration of SARS-CoV-2 viral copies this assay can detect is 250 copies / mL. A negative result does not preclude SARS-CoV-2 infection and should not be used as the sole basis for treatment or other patient management decisions.  A negative result may occur with improper specimen collection / handling, submission of specimen other than nasopharyngeal swab, presence of viral mutation(s) within the areas targeted by this assay, and inadequate number of viral copies (<250 copies / mL). A negative result must be combined with clinical observations, patient history, and epidemiological information.  Fact Sheet for Patients:   StrictlyIdeas.no  Fact Sheet for Healthcare Providers: BankingDealers.co.za  This test is not yet approved or  cleared by the [place] FDA and has been authorized for detection and/or diagnosis of SARS-CoV-2  by FDA under an Emergency Use Authorization (EUA).  This EUA will remain in effect (meaning this test can be used) for the duration of the COVID-19 declaration under Section 564(b)(1) of the Act, 21 U.S.C. section 360bbb-3(b)(1), unless the authorization is terminated or revoked sooner.  Performed at [facility], [place] [address] [place], [place] [address]  MRSA PCR Screening     Status: None   Collection Time: 05/25/20 11:45 PM   Specimen: Nasal Mucosa; Nasopharyngeal  Result Value Ref Range Status   MRSA by PCR NEGATIVE NEGATIVE Final    Comment:        The GeneXpert MRSA Assay (FDA approved for NASAL specimens only), is one component of a comprehensive MRSA colonization surveillance program. It is not intended to diagnose MRSA infection nor to guide or monitor treatment for MRSA infections. Performed at [facility], [place] [address], [place], [place] [address]   Acid Fast Smear (AFB)     Status: None   Collection Time: 05/26/20  1:26 PM   Specimen: Bronchial Alveolar Lavage  Result Value Ref Range Status   AFB Specimen Processing Concentration  Final   Acid Fast Smear Negative  Final    Comment: (NOTE) Performed At: [facility] [place], [place] [id number] Rush Farmer MD YQ:[id number]    Source (AFB) BRONCHIAL ALVEOLAR LAVAGE  Final    Comment: RML Performed at [facility], [place] [address], [place], [place] [address]   Pneumocystis smear by DFA     Status: None   Collection Time: 05/26/20  1:26 PM   Specimen: Bronchial Alveolar Lavage; Respiratory  Result Value Ref Range Status   Specimen Source-PJSRC BRONCHIAL ALVEOLAR LAVAGE  Final    Comment: RML   Pneumocystis jiroveci Ag NEGATIVE  Final    Comment: Performed at [place] Performed at [place] [address] [place], [place] [address]   Culture, respiratory     Status: None    Collection Time: 05/26/20  1:37 PM   Specimen: Bronchoalveolar Lavage; Respiratory  Result Value Ref Range Status   Specimen Description   Final    BRONCHIAL ALVEOLAR LAVAGE RML Performed at [place] [address], [place], [place] [address]    Special Requests   Final    NONE Performed at [facility], [place] [address], [place], [place] [address]    Gram Stain   Final    MODERATE WBC PRESENT, PREDOMINANTLY MONONUCLEAR NO ORGANISMS SEEN    Culture   Final    NO GROWTH 2 DAYS Performed at [place] [address] [place], [place] [address]    Report Status 05/29/2020 FINAL  Final  Acid Fast Smear (AFB)     Status: None   Collection Time: 05/26/20  1:53 PM   Specimen: Bronchial Alveolar Lavage  Result Value Ref Range Status   AFB Specimen Processing Concentration  Final   Acid Fast Smear Negative  Final    Comment: (NOTE) Performed At: [facility] [place], [place] [id number] Rush Farmer MD DU:[id number]    Source (AFB) BRONCHIAL ALVEOLAR LAVAGE  Final    Comment: RUL Performed at [facility], [place] [address] [place], [place] [address]   Culture, respiratory     Status: None   Collection Time: 05/26/20  1:54 PM   Specimen: Bronchoalveolar Lavage; Respiratory  Result Value Ref Range Status   Specimen Description   Final    BRONCHIAL ALVEOLAR LAVAGE RUL Performed at [place] [address] [place], [place] [address]    Special Requests   Final    NONE Performed at [facility], [place] [address] [place], [place] [address]    Gram Stain   Final    MODERATE  WBC PRESENT, PREDOMINANTLY MONONUCLEAR NO ORGANISMS SEEN    Culture   Final    NO GROWTH 2 DAYS Performed at [place] [address] [place], [place] [address]    Report Status 05/29/2020 FINAL  Final  Pneumocystis smear by DFA     Status: None   Collection Time:  05/26/20  1:54 PM   Specimen: Bronchial Alveolar Lavage; Respiratory  Result Value Ref Range Status   Specimen Source-PJSRC BRONCHIAL ALVEOLAR LAVAGE  Final    Comment: RUL   Pneumocystis jiroveci Ag NEGATIVE  Final    Comment: Performed at [place] [facility] Performed at [facility], [place] [address], [place], [place] [address]   Respiratory Panel by PCR     Status: None   Collection Time: 05/26/20  6:19 PM   Specimen: Nasopharyngeal Swab; Respiratory  Result Value Ref Range Status   Adenovirus NOT DETECTED NOT DETECTED Final   Coronavirus 229E NOT DETECTED NOT DETECTED Final    Comment: (NOTE) The Coronavirus on the Respiratory Panel, DOES NOT test for the novel  Coronavirus (2019 nCoV)    Coronavirus HKU1 NOT DETECTED NOT DETECTED Final   Coronavirus NL63 NOT DETECTED NOT DETECTED Final   Coronavirus OC43 NOT DETECTED NOT DETECTED Final   Metapneumovirus NOT DETECTED NOT DETECTED Final   Rhinovirus / Enterovirus NOT DETECTED NOT DETECTED Final   Influenza A NOT DETECTED NOT DETECTED Final   Influenza B NOT DETECTED NOT DETECTED Final   Parainfluenza Virus 1 NOT DETECTED NOT DETECTED Final   Parainfluenza Virus 2 NOT DETECTED NOT DETECTED Final   Parainfluenza Virus 3 NOT DETECTED NOT DETECTED Final   Parainfluenza Virus 4 NOT DETECTED NOT DETECTED Final   Respiratory Syncytial Virus NOT DETECTED NOT DETECTED Final   Bordetella pertussis NOT DETECTED NOT DETECTED Final   Chlamydophila pneumoniae NOT DETECTED NOT DETECTED Final   Mycoplasma pneumoniae NOT DETECTED NOT DETECTED Final    Comment: Performed at [facility], [place]. [address], [place], [place] [address]  MRSA PCR Screening     Status: None   Collection Time: 05/30/20 11:18 AM   Specimen: Nasal Mucosa; Nasopharyngeal  Result Value Ref Range Status   MRSA by PCR NEGATIVE NEGATIVE Final    Comment:        The GeneXpert MRSA Assay (FDA approved for NASAL specimens only), is one  component of a comprehensive MRSA colonization surveillance program. It is not intended to diagnose MRSA infection nor to guide or monitor treatment for MRSA infections. Performed at [facility], [place] [address], [place], [place] [address]       Studies:  DG CHEST PORT 1 VIEW  Result Date: 06/01/2020 CLINICAL DATA:  Hypoxia EXAM: PORTABLE CHEST 1 VIEW COMPARISON:  Chest radiograph May 29, 2020 and chest CT May 30, 2020 FINDINGS: Multifocal airspace opacity consistent with multifocal pneumonia is again noted. Areas of underlying fibrotic change also present, stable. Small right pleural effusion evident. Heart size and pulmonary vascularity are normal. No adenopathy. Port-A-Cath tip is in the superior vena cava. No pneumothorax. Degenerative change noted in each shoulder. IMPRESSION: Persistent multifocal airspace opacity with underlying fibrosis. No new opacity evident. Small right pleural effusion. Stable cardiac silhouette. Port-A-Cath tip in superior vena cava. Electronically Signed   By: Lowella Grip III M.D.   On: 06/01/2020 09:08    Assessment: 85 y.o. [place], [place] man status post gastric biopsy 09/20/2018 showing adenocarcinoma, with a CA 19-9 greater than  65,000; staging studies showing an apparent mass adjacent to the head of the pancreas, innumerable liver lesions, upper abdominal mesenteric and peripancreatic lymphadenopathy, and an isolated sclerotic density in the left iliac bone             (a) genomics requested on gastric biopsy showed the tumor to be HER-2 amplified at 3+, PD-L1 positive, and T p53 mutated.  MSI is stable, mismatch repair status is proficient and the tumor mutational burden is intermediate.  (1) chest CT scan 09/19/2018 shows right lower lobe pulmonary embolism             (a) on intravenous heparin started 09/19/2018, transitioned to apixaban 10/01/2018  (2) genetics testing 10/04/2018: negative             (a) family  history of prostate and early breast cancer; patient has a history of prostate cancer  (3) started gemcitabine/Abraxane 10/09/2018, repeated days 1, 8 and 15 of each 28-day cycle             (a) stopped after 1 cycle (3 doses) with reassessment of diagnosis based on CARIS results, noted above  (4) started oxaliplatin, capecitabine and trastuzumab 11/06/2018             (a) baseline echocardiogram 09/20/2018 shows an ejection fraction in the 55-60% range             (b) oxaliplatin and trastuzumab every 21 days started 11/06/2018             (c) capecitabine dose 1000 mg twice daily for 14 days of every 21-day cycle             (d) CT of the abdomen and pelvis 02/11/2019 shows marked improvement in his liver lesions and resolution of periportal adenopathy             (e) tumor markers also show significant response,             (f) echocardiogram 02/12/2019 shows an ejection fraction in the 60-65% range             (g) CT abd/pelvis show stable/ improved disease, tumor markers nearly normal             (h) oxaliplatin stopped aftwer 04/30/2019 dose             (i) capecitabine stopped after September cycle              (5) trastuzumab maintenance started 05/20/2019, last dose 07/22/2019, with progression  (6) disease progression documented by lab work and CT scan 07/16/2019             (a) resumed FOLFOX 07/29/2019, continuing trastuzumab (every 14 days).             (b) restaging studies after 3 cycles showed evidence of progression (tumor markers equivocal)             (c) FOLFOX discontinued 08/26/2019  (7) started T-DM1 09/30/2019, repeat every 21 days             (a) pembrolizumab added 12/02/2019             (b) echocardiogram 08/25/2019 showed an ejection fraction in the 60-65% range             (c) repeat CT of the abdomen 12/11/2019 shows growth in 2 liver lesions  (8) started [facility] 12/23/2019 at 5.4 mg/kg.             (a) dose increased  to 6.1 mg/kg with the third dose  and two 6.4 (target dose) 03/17/2020                  (b) Changed to every 4 weeks after receiving 04/28/2020 dose (which was his most recent)  (9) iron deficiency anemia:             (a) received Feraheme 04/22 /2021 and 01/08/2020      (10) pneumonia/ pneumonitis?  (a) patchy infiltrates on CXR 04/28/2020   (i) COVID-19 negative   (ii) received antibodies as outpatient   (iii) s/p Z=pack  (b) worsening cough, SOB and fever   (i) CT angio 05/25/2020 no PE, worsening bilateral infiltrates c/w atypical infection   (ii) vancomycin, unasyn started 05/25/2020; vanco d/c'd after one dose   (iii) methylprednisolone started 05/27/2020   (iv) cytology from bronchial fluid 05/26/2020 negative for malignancy   Plan:  Karo continues on cefepime with no improvement in his respiratory status and with all cultures negative to date. He has been on high-dose steroids a week with no improvement in his breathing. Every day without improvement decreases the probability that we are dealing with treatment-induced pneumonitis and increases the likely hood we are dealing with spread of his cancer to the lungs.  I discussed this with Riku and by phone with wife [name]. They understand if this is lymphangitic spread of cancer I cannot treat him-- the treatment would likely take his life. Given that I think the most likely outcome is continuing respiratory failure--I told [name] it would not surprise me if [place] were no longer here in 10-14 days.   Patient and family were hoping to take [place] home but I am concerned as his breathing deteriorates it may not be possible for him to be comfortable there and my recommendation is to refer to [facility]. We would continue steroids orally and if Jamee does get better he can always go home from there, though more likely he will continue to decline. [place] and [place] appear to agree, and I am waiting to hear from their older son who lives in [place]. If  there is availability at [facility] think he could be transferred as early as tomorrow or the next day, assuming treatment team agrees  Will follow with you      Chauncey Cruel, MD 06/02/2020  8:34 AM Medical Oncology and Hematology [facility] [place], [place] [address] Tel. [phone]    Fax. [phone]

## 2020-06-02 NOTE — Progress Notes (Signed)
Pt alert and aware sitting up in bed. Pt states he is doing pretty good. We listen to a devotional and his son Peter Wood came in to visit. The chaplain offered caring  And supportive presence. Further visits will be offered.

## 2020-06-02 NOTE — Progress Notes (Signed)
Daily Progress Note   Patient Name: Peter Wood       Date: 06/02/2020 DOB: July 30, 1935  Age: 85 y.o. MRN#: [id number] Attending Physician: Nita Sells, MD Primary Care Physician: Cassandria Anger, MD Admit Date: 05/25/2020  Reason for Consultation/Follow-up: Establishing goals of care  Subjective: I met today with Peter Wood.  We discussed his clinical course over the past couple of days as well as options for care moving forward.  He reports discussing residential hospice option with Dr. Jana Hakim earlier today.  He expressed needing time to discuss this further with his family prior to them coming to a decision.  See below.  Length of Stay: 7  Current Medications: Scheduled Meds:  . amLODipine  2.5 mg Oral Daily  . apixaban  2.5 mg Oral BID  . chlorhexidine  15 mL Mouth Rinse BID  . Chlorhexidine Gluconate Cloth  6 each Topical Daily  . docusate sodium  200 mg Oral Daily  . lactose free nutrition  237 mL Oral TID WC  . latanoprost  1 drop Both Eyes QHS  . mouth rinse  15 mL Mouth Rinse q12n4p  . methylPREDNISolone (SOLU-MEDROL) injection  60 mg Intravenous Q8H   Followed by  . [START ON 06/05/2020] methylPREDNISolone (SOLU-MEDROL) injection  60 mg Intravenous Q12H   Followed by  . [START ON 06/08/2020] predniSONE  60 mg Oral Q breakfast  . metoprolol tartrate  12.5 mg Oral BID  . multivitamin with minerals  1 tablet Oral Daily  . polyethylene glycol  17 g Oral Daily    Continuous Infusions:   PRN Meds: acetaminophen, benzonatate, hydrALAZINE, morphine, sodium chloride flush  Physical Exam       General: Alert, awake, in no acute distress.  HEENT: No bruits, no goiter, no JVD Heart: Tachycardic. No murmur appreciated. Lungs: Decreased air movement,  clear Abdomen: Soft, nontender, nondistended, positive bowel sounds.  Ext: No significant edema Skin: Warm and dry Neuro: Grossly intact, nonfocal.    Vital Signs: BP 137/86 (BP Location: Right Arm)   Pulse 89   Temp 97.8 F (36.6 C) (Axillary)   Resp (!) 21   Ht $R'5\' 4"'rh$  (1.626 m)   Wt 61.2 kg   SpO2 95%   BMI 23.17 kg/m  SpO2: SpO2: 95 % O2 Device: O2 Device: High Flow Nasal Cannula,  NRB O2 Flow Rate: O2 Flow Rate (L/min): 15 L/min (+ 15 L NRB )  Intake/output summary:   Intake/Output Summary (Last 24 hours) at 06/02/2020 1824 Last data filed at 06/02/2020 1800 Gross per 24 hour  Intake 339.14 ml  Output 600 ml  Net -260.86 ml   LBM: Last BM Date: 05/31/20 Baseline Weight: Weight: 61.2 kg Most recent weight: Weight: 61.2 kg       Palliative Assessment/Data:      Patient Active Problem List   Diagnosis Date Noted  . Advanced care planning/counseling discussion   . Palliative care by specialist   . Acute respiratory failure with hypoxia ([place]) 05/27/2020  . Chronic diastolic CHF (congestive heart failure) ([place]) 05/27/2020  . Multifocal pneumonia 05/26/2020  . Hypoalbuminemia 05/26/2020  . Abnormal LFTs 05/26/2020  . Iron deficiency anemia due to chronic blood loss 12/26/2019  . Port-A-Cath in place 12/18/2018  . Anemia 11/12/2018  . Anemia due to antineoplastic chemotherapy 11/06/2018  . Genetic testing 10/14/2018  . Metastasis from malignant tumor of stomach (Peter Wood) 10/04/2018  . Gastric cancer (Peter Wood) 10/04/2018  . Family history of prostate cancer   . Family history of breast cancer   . Aortic atherosclerosis ([place]) 10/03/2018  . Acute pulmonary embolus (Peter Wood) 09/20/2018  . Pulmonary embolism (Peter Wood) 09/19/2018  . Liver metastases (Peter Wood) 09/19/2018  . Abdominal neoplasm without bowel obstruction 09/19/2018  . Nausea 09/19/2018  . Abdominal pain 09/19/2018  . Upper respiratory infection 08/01/2018  . Varicose vein of leg 07/22/2018  . Weight loss 01/18/2018  .  Claudication (Peter Wood) 01/16/2017  . Macular hole, right 09/19/2016  . Macular hole, right eye 09/19/2016  . Right sciatic nerve pain 07/19/2016  . Finger mass, right 06/24/2015  . Prostate cancer (Peter Wood) 09/16/2013  . Rash and nonspecific skin eruption 02/12/2013  . Goals of care, counseling/discussion 09/01/2011  . TOBACCO USE, QUIT 08/25/2009  . ERECTILE DYSFUNCTION 03/03/2009  . PSA, INCREASED 03/03/2009  . Essential hypertension 06/10/2007  . DIVERTICULOSIS, COLON 06/10/2007  . COLONIC POLYPS, HX OF 06/10/2007    Palliative Care Assessment & Plan   Patient Profile: 85 yo male admitted with respiratory failure status post bronc 9/15.  Thought to be possible pneumonitis versus more likely lymphangitic spread.  Palliative following for goals of care.  Assessment: Patient Active Problem List   Diagnosis Date Noted  . Advanced care planning/counseling discussion   . Palliative care by specialist   . Acute respiratory failure with hypoxia ([place]) 05/27/2020  . Chronic diastolic CHF (congestive heart failure) (Peter Wood) 05/27/2020  . Multifocal pneumonia 05/26/2020  . Hypoalbuminemia 05/26/2020  . Abnormal LFTs 05/26/2020  . Iron deficiency anemia due to chronic blood loss 12/26/2019  . Port-A-Cath in place 12/18/2018  . Anemia 11/12/2018  . Anemia due to antineoplastic chemotherapy 11/06/2018  . Genetic testing 10/14/2018  . Metastasis from malignant tumor of stomach (Peter Wood) 10/04/2018  . Gastric cancer (Peter Wood) 10/04/2018  . Family history of prostate cancer   . Family history of breast cancer   . Aortic atherosclerosis ([place]) 10/03/2018  . Acute pulmonary embolus (Peter Wood) 09/20/2018  . Pulmonary embolism (Peter Wood) 09/19/2018  . Liver metastases (Peter Wood) 09/19/2018  . Abdominal neoplasm without bowel obstruction 09/19/2018  . Nausea 09/19/2018  . Abdominal pain 09/19/2018  . Upper respiratory infection 08/01/2018  . Varicose vein of leg 07/22/2018  . Weight loss 01/18/2018  . Claudication (Peter Wood)  01/16/2017  . Macular hole, right 09/19/2016  . Macular hole, right eye 09/19/2016  . Right sciatic nerve pain 07/19/2016  .  Finger mass, right 06/24/2015  . Prostate cancer (Peter Wood) 09/16/2013  . Rash and nonspecific skin eruption 02/12/2013  . Goals of care, counseling/discussion 09/01/2011  . TOBACCO USE, QUIT 08/25/2009  . ERECTILE DYSFUNCTION 03/03/2009  . PSA, INCREASED 03/03/2009  . Essential hypertension 06/10/2007  . DIVERTICULOSIS, COLON 06/10/2007  . COLONIC POLYPS, HX OF 06/10/2007   Recommendations/Plan:  I met today with Mr. Ledwell.  We reviewed the conversation he had earlier today with Dr. Jana Hakim.  He had questions regarding residential hospice, particularly regarding the finances involved in residential hospice.  He expressed wanting to ensure that his wife still had enough resources to care for herself as he was concerned that it would cost a great deal of money for him to go to residential hospice if this is what they decide.  Overall, Mr. Beller reports needing time to discuss option of residential hospice with his wife and son.  He appreciates Dr. Jana Hakim reaching out to his family to discuss recommendation.  I will plan to follow-up again tomorrow to answer any further questions and see if he has made a decision about next steps in care.  I agree that residential hospice is likely his best option moving forward at this point.  Currently he is on HFNC at 8 L as well as nonrebreather.  If he decides to pursue residential hospice, we will need to ensure that he is able to be maintained on level of oxygen that can be accommodated during transport and at hospice facility.  Code Status:    Code Status Orders  (From admission, onward)         Start     Ordered   05/30/20 1100  Do not attempt resuscitation (DNR)  Continuous       Question Answer Comment  In the event of cardiac or respiratory ARREST Do not call a "code blue"   In the event of cardiac or respiratory  ARREST Do not perform Intubation, CPR, defibrillation or ACLS   In the event of cardiac or respiratory ARREST Use medication by any route, position, wound care, and other measures to relive pain and suffering. May use oxygen, suction and manual treatment of airway obstruction as needed for comfort.      05/30/20 1059        Code Status History    Date Active Date Inactive Code Status Order ID Comments User Context   05/26/2020 0109 05/30/2020 1059 Full Code [id number]  Mikki [place], MD ED   09/19/2018 1718 09/25/2018 1751 Full Code [id number]  Janora Norlander, MD ED   09/19/2016 1634 09/20/2016 1206 Full Code [id number]  [name], MD Inpatient   Advance Care Planning Activity       Prognosis: Agree with Dr. Virgie Dad assessment that his prognosis is likely less than 2 weeks if this is lymphangitic spread and will continue to progress.  Discharge Planning:  To Be Determined- ? residential hospice facility  Care plan was discussed with patient  Thank you for allowing the Palliative Medicine Team to assist in the care of this patient.   Time In: 1600 Time Out: 1625 Total Time 25 Prolonged Time Billed No      Greater than 50%  of this time was spent counseling and coordinating care related to the above assessment and plan.  Micheline Rough, MD  Please contact Palliative Medicine Team phone at [phone] for questions and concerns.

## 2020-06-02 NOTE — Hospital Course (Signed)
85 y/o M admitted 9/14 with reports of shortness of breath and fever.  He has a history of gastric adenocarcinoma with metastatic disease to liver and bone (initial diagnosis 09/2018) s/p chemotherapy with slow progression on Enhertu (last dose 04/28/20).   He had a CXR on 8/18 that demonstrated bilateral infiltrates.  COVID testing was negative at that time.  However, he was treated with monoclonal antibody (casirivimab / imdevimab) on 8/19 followed by a course of azithromycin without significant improvement.     Past Medical History  HTN PE - on eliquis  Prostate Cancer - s/p radiation treatment  Gastric adenocarcinoma with metastatic disease to liver and bone, initial dx 09/2018   Significant Hospital Events   9/14 Admit 9/15 PCCM consulted for pulmonary evaluation  9/16 Steroids initiated  9/19 Worsening hypoxia, transfer to SDU 9/20 HFNC 15L + NRB   Consults:  PCCM   Procedures:      Significant Diagnostic Tests:  CTA Chest 9/15 >> negative for PE, extensive bilateral asymmetric airspace opacities, small right pleural effusion, small simple appearing pericardial effusion without CT evidence of tamponade, central pulmonary arterial enlargement FOB 9/15 >> clear airways, minimal secretions, scant bloody tinged secretions in LLL CT Chest w/o 9/19 >> extensive patchy consolidation and GGO throughout both lungs, stable to minimally improved since 9/14, widespread moderate bronchiectasis with some parenchymal distortion and volume loss - findings are essentially new since 09/2018 and suggest a severe multilobar PNA potentially superimposed on underlying fibrotic ILD, solid 0.9 cm anterior LUL pulmonary nodule, small bilateral pleural effusions R>L, moderate pericardial effusion, multiple indistinct confluent hypodense liver masses compatible with known liver metastases   Micro Data:  COVID 9/14 >> negative  RVP 9/15 >> negative  BAL 9/15 >> negative  AFB 9/15 >> negative  PCP by DFA 9/15 >>  negative  Cytology 9/15 >> benign bronchial cells, no malignant cells identified   Antimicrobials:  Unasyn 9/15 >> 9/19 Cefepime 9/19 >> 9/21

## 2020-06-02 NOTE — TOC Progression Note (Signed)
Transition of Care [facility]) - Progression Note    Patient Details  Name: Peter Wood MRN: [id number] Date of Birth: Feb 02, 1935  Transition of Care [facility]) CM/SW Contact  Leeroy Cha, RN Phone Number: 06/02/2020, 9:58 AM  Clinical Narrative:    Patient is bed bound at this point due to shortness of breathe and work of breathing. DNR possible hospital death possible transfer to [facility] depending on md and family.   Expected Discharge Plan: [place] Barriers to Discharge: Continued Medical Work up  Expected Discharge Plan and Services Expected Discharge Plan: [place] In-house Referral: Chaplain Discharge Planning Services: CM Consult   Living arrangements for the past 2 months: Single Family Home                                       Social Determinants of Health (SDOH) Interventions    Readmission Risk Interventions No flowsheet data found.

## 2020-06-02 NOTE — Progress Notes (Signed)
PROGRESS NOTE    Peter Wood  OIN:[id number] DOB: Sep 23, 1934 DOA: 05/25/2020 PCP: Cassandria Anger, MD  Brief Narrative:  85 year old black male prostate cancer status post XRT 2015 Gastric ulcer 1 metastatic adenocarcinoma diagnosed on EGD 09/20/2018 status post chemotherapy-liver mets-Enhertu last given 03/19/6282 Treatment complicated by questionable atypical pneumonia on office visit 05/25/2020-Rx previously COVID-19 Monoclonal antibody + Z-Pak Pulmonary embolism 09/20/2018  Sent to emergency room 9/14 by oncologist because of multifocal lung infiltrates- patient was coordinated for bronchoscopy with pulmonology Patient developed worsening hypoxia 9/19 transferred to stepdown Ultimately palliative care consulted in addition and patient is now DNR and seems to be interested in hospice  Assessment & Plan:   Principal Problem:   Acute respiratory failure with hypoxia ([place]) Active Problems:   Essential hypertension   Pulmonary embolism ([place])   Liver metastases ([place])   Metastasis from malignant tumor of stomach ([place])   Gastric cancer ([place])   Iron deficiency anemia due to chronic blood loss   Multifocal pneumonia   Hypoalbuminemia   Abnormal LFTs   Chronic diastolic CHF (congestive heart failure) ([place])   Advanced care planning/counseling discussion   Palliative care by specialist   1. Acute hypoxic respiratory failure status post bronchoscopy 05/26/2020-unlikely infectious etiology more likely treatment induced pneumonitis/lymphangitis a. Continue supportive management high flow nasal cannula b. Continue tapering Solu-Medrol for 6 days and then oral prednisone subsequently c. Continue Roxanol sublingually 2.54 work of breathing/discomfort for now continue cefepime although unlikely will make a difference if this is lymphangitic spread d.  2. Chronic systolic diastolic heart failure a. Holding all diuretics at this time, continue metoprolol 12.5 twice daily 3. History of PE  09/20/2018 ---continue Eliquis 2.5 twice daily at this time 4. Gastric adenocarcinoma status post chemotherapy complicated by liver mets a. Greatly appreciate oncology input and delineation of goals of care b. We will asked TOC to help plan with family next steps 5. HTN 6. NSVT-continue metoprolol as above 7. Hypernatremia- no further labs no a other follow-up    DVT prophylaxis: Eliquis Code Status: DNR Family Communication: None present at this time Disposition:   Status is: Inpatient  Remains inpatient appropriate because:Ongoing diagnostic testing needed not appropriate for outpatient work up, Unsafe d/c plan and IV treatments appropriate due to intensity of illness or inability to take PO   Dispo: The patient is from: Home              Anticipated d/c is to: Unclear at this time              Anticipated d/c date is: 2 days              Patient currently is not medically stable to d/c.       Consultants:   Oncology  Palliative care  Procedures: None cefepime  Antimicrobials: cefepime     Subjective: Awake coherent quite sleepy and states that that he received something to help him sleep Not in any pain  Objective: Vitals:   06/02/20 0100 06/02/20 0400 06/02/20 0408 06/02/20 0600  BP: (!) 158/77 (!) 147/94  (!) 155/90  Pulse:      Resp: (!) 22 18  14   Temp:   (!) 97.5 F (36.4 C)   TempSrc:   Oral   SpO2:  100%    Weight:      Height:        Intake/Output Summary (Last 24 hours) at 06/02/2020 0709 Last data filed at 06/02/2020 0600 Gross per 24  hour  Intake 794.44 ml  Output 1100 ml  Net -305.56 ml   Filed Weights   05/25/20 1744 05/26/20 1234  Weight: 61.2 kg 61.2 kg    Examination:  General exam: Awake alert no distress EOMI NCAT no focal deficit Respiratory system: Clear no rales rhonchi Cardiovascular system: S1-S2 slight tachycardia Gastrointestinal system: Soft no distention no rebound no guarding. Central nervous system: Intact no  focal deficit Extremities: No lower extremity edema ROM intact Skin: Intact Psychiatry: Euthymic congruent  Data Reviewed: I have personally reviewed following labs and imaging studies BUNs/creatinine 29/0.6-->37/0.5   Radiology Studies: DG CHEST PORT 1 VIEW  Result Date: 06/01/2020 CLINICAL DATA:  Hypoxia EXAM: PORTABLE CHEST 1 VIEW COMPARISON:  Chest radiograph May 29, 2020 and chest CT May 30, 2020 FINDINGS: Multifocal airspace opacity consistent with multifocal pneumonia is again noted. Areas of underlying fibrotic change also present, stable. Small right pleural effusion evident. Heart size and pulmonary vascularity are normal. No adenopathy. Port-A-Cath tip is in the superior vena cava. No pneumothorax. Degenerative change noted in each shoulder. IMPRESSION: Persistent multifocal airspace opacity with underlying fibrosis. No new opacity evident. Small right pleural effusion. Stable cardiac silhouette. Port-A-Cath tip in superior vena cava. Electronically Signed   By: Lowella Grip III M.D.   On: 06/01/2020 09:08     Scheduled Meds: . amLODipine  2.5 mg Oral Daily  . apixaban  2.5 mg Oral BID  . chlorhexidine  15 mL Mouth Rinse BID  . Chlorhexidine Gluconate Cloth  6 each Topical Daily  . docusate sodium  200 mg Oral Daily  . lactose free nutrition  237 mL Oral TID WC  . latanoprost  1 drop Both Eyes QHS  . mouth rinse  15 mL Mouth Rinse q12n4p  . methylPREDNISolone (SOLU-MEDROL) injection  60 mg Intravenous Q6H  . metoprolol tartrate  12.5 mg Oral BID  . multivitamin with minerals  1 tablet Oral Daily  . polyethylene glycol  17 g Oral Daily   Continuous Infusions:    LOS: 7 days    Time spent: 25  Nita Sells, MD [facility] To contact the attending provider between 7A-7P or the covering provider during after hours 7P-7A, please log into the web site [email or web address] and access using universal [place] password for that web site. If you do  not have the password, please call the hospital operator.  06/02/2020, 7:09 AM

## 2020-06-02 NOTE — Progress Notes (Signed)
NAME:  Peter Wood, MRN:  [id number], DOB:  1935/05/05, LOS: 7 ADMISSION DATE:  05/25/2020, CONSULTATION DATE:  05/26/20 REFERRING MD: Dr. [place] Pink, CHIEF COMPLAINT:  SOB, Fever   Brief History   85 y/o M admitted 9/14 with reports of shortness of breath and fever.  He has a history of gastric adenocarcinoma with metastatic disease to liver and bone (initial diagnosis 09/2018) s/p chemotherapy with slow progression on Enhertu (last dose 04/28/20).   He had a CXR on 8/18 that demonstrated bilateral infiltrates.  COVID testing was negative at that time.  However, he was treated with monoclonal antibody (casirivimab / imdevimab) on 8/19 followed by a course of azithromycin without significant improvement.    Past Medical History  HTN PE - on eliquis  Prostate Cancer - s/p radiation treatment  Gastric adenocarcinoma with metastatic disease to liver and bone, initial dx 09/2018  Significant Hospital Events   9/14 Admit 9/15 PCCM consulted for pulmonary evaluation  9/16 Steroids initiated  9/19 Worsening hypoxia, transfer to SDU 9/20 HFNC 15L + NRB  Consults:  Hematology Palliative  Procedures:    Significant Diagnostic Tests:  CTA Chest 9/15 >> negative for PE, extensive bilateral asymmetric airspace opacities, small right pleural effusion, small simple appearing pericardial effusion without CT evidence of tamponade, central pulmonary arterial enlargement FOB 9/15 >> clear airways, minimal secretions, scant bloody tinged secretions in LLL CT Chest w/o 9/19 >> extensive patchy consolidation and GGO throughout both lungs, stable to minimally improved since 9/14, widespread moderate bronchiectasis with some parenchymal distortion and volume loss - findings are essentially new since 09/2018 and suggest a severe multilobar PNA potentially superimposed on underlying fibrotic ILD, solid 0.9 cm anterior LUL pulmonary nodule, small bilateral pleural effusions R>L, moderate pericardial effusion,  multiple indistinct confluent hypodense liver masses compatible with known liver metastases  Micro Data:  COVID 9/14 >> negative  RVP 9/15 >> negative  BAL 9/15 >> negative  AFB 9/15 >> negative  PCP by DFA 9/15 >> negative  Cytology 9/15 >> benign bronchial cells, no malignant cells identified  Antimicrobials:  Unasyn 9/15 >> 9/19 Cefepime 9/19 >> 9/21  Interim history/subjective:  Pt states he is "comfortable" with both the 8 L HFNC & NRB 15 L.  Objective   Blood pressure (!) 167/97, pulse 89, temperature (!) 97 F (36.1 C), temperature source Axillary, resp. rate (!) 21, height _0  (1.626 m), weight 61.2 kg, SpO2 100 %.        Intake/Output Summary (Last 24 hours) at 06/02/2020 0935 Last data filed at 06/02/2020 0600 Gross per 24 hour  Intake 674.44 ml  Output 1100 ml  Net -425.56 ml   Filed Weights   05/25/20 1744 05/26/20 1234  Weight: 61.2 kg 61.2 kg    Examination: General: Pt sitting up in bed, alert & pleasant HEENT: MM pink/moist, anicteric, glasses in place Neuro: Alert & Oriented, follows commands, PERRL CV: s1s2, RRR, no murmurs/rubs/gallops, NSR on monitor, +2 pulses Pulm: regular/unlabored on 8 L HFNC & NRB, breath sounds diminished bilaterally GI: soft, non tender, active bowel sounds Skin: no rashes or lesions noted Extremities: warm/dry, +1BLE edema, MAE  Labs/imaging reviewed CXR 9/21 showed continued small R pleural effusion  Resolved Hospital Problem list      Assessment & Plan:   Acute Hypoxic Respiratory Failure in setting of Diffuse Bilateral Infiltrates  Fever  Hx Pulmonary Embolism on Eliquis  Recent hx of negative COVID testing, however treated with monoclonal antibodies on 8/19 & azithromycin.  Differential dx includes viral / bacterial infection, component pulmonary edema with noted central pulmonary arterial enlargement, BOOP/COP, pneumonitis / ILD associated with trastuzumab and lymphangitic spread of malignancy. CT angio negative  for PE and he is on chronic anticoagulation for prior PE. Minimal secretions on bronchoscopy. Suspect most likely dx is pneumonitis vs malignant spread. FOB negative for bacterial, AFB, PCP.   - Will begin taper of Solu-medrol: 60 mg Q 8/ 3 days > 60 mg Q 12/ 3 days > 60 mg QD/ ongoing - continue HFNC with NRB PRN - continue home Eliquis for Hx of PE - Aggressive pulmonary hygiene as tolerated: IS 10x Q1h while awake, mobilize  - intermittent CXR - PRN low dose morphine for air hunger  Severe Protein Calorie Malnutrition  - diet as tolerated  Goals of care Palliative & Hematology following with recent documented discussions with family & patient.   Dr. Jana Hakim discussed probability of lymphangitic spread, with inability to treat, recommending transfer to hospice.  Patient stated he would like to speak further with his wife today regarding end-of-life care.  Discussed with patient that due to oxygen requirements hospice is an appropriate choice to being discharged home.  Best practice:  Diet: As tolerated, per primary  Pain/Anxiety/Delirium protocol (if indicated): n/a  VAP protocol (if indicated): n/a  DVT prophylaxis: eliquis  GI prophylaxis: per primary  Glucose control: per primary  Mobility: as tolerated  Code Status: DNR Family Communication: Per primary  Disposition: Per primary   Labs   CBC: Recent Labs  Lab 05/27/20 0304 05/28/20 0420 05/31/20 0429  WBC 15.2* 7.5 11.7*  HGB 9.4* 9.1* 8.9*  HCT 28.0* 27.8* 28.5*  MCV 102.9* 103.3* 105.9*  PLT 303 329 071    Basic Metabolic Panel: Recent Labs  Lab 05/29/20 0331 05/30/20 0338 05/31/20 0429 06/01/20 0333 06/02/20 0513  NA 141 146* 144 147* 143  K 3.8 4.1 3.9 4.2 4.1  CL 102 104 104 103 102  CO2 _0 32 31  GLUCOSE 127* 121* 110* 133* 124*  BUN 31* 32* 29* 36* 37*  CREATININE 0.68 0.66 0.62 0.68 0.54*  CALCIUM 8.8* 9.2 8.9 9.2 9.1  MG 2.3  --   --   --   --    GFR: Estimated Creatinine Clearance:  59.6 mL/min (A) (by C-G formula based on SCr of 0.54 mg/dL (L)). Recent Labs  Lab 05/27/20 0304 05/27/20 0305 05/28/20 0420 05/31/20 0429  PROCALCITON  --  11.09  --   --   WBC 15.2*  --  7.5 11.7*    Liver Function Tests: Recent Labs  Lab 05/27/20 0304 05/28/20 0420 05/29/20 0331 05/31/20 0429  AST 52* 54* 119* 84*  ALT 46* 43 71* 83*  ALKPHOS 358* 374* 463* 412*  BILITOT 1.5* 1.2 0.9 1.3*  PROT 5.6* 5.9* 5.8* 5.6*  ALBUMIN 2.0* 1.8* 2.1* 2.1*   No results for input(s): LIPASE, AMYLASE in the last 168 hours. No results for input(s): AMMONIA in the last 168 hours.  ABG No results found for: PHART, PCO2ART, PO2ART, HCO3, TCO2, ACIDBASEDEF, O2SAT   Coagulation Profile: Recent Labs  Lab 05/27/20 0304 05/28/20 0420  INR 1.6* 1.8*    Cardiac Enzymes: No results for input(s): CKTOTAL, CKMB, CKMBINDEX, TROPONINI in the last 168 hours.  HbA1C: No results found for: HGBA1C  CBG: No results for input(s): GLUCAP in the last 168 hours.     Critical care time: n/a     Domingo Pulse [place], AGACNP-BC  Pulmonary &  Critical Care    Please see Amion for pager details.

## 2020-06-02 NOTE — Progress Notes (Signed)
Attempted to take patient off NRB, but sats fell to 75% and WOB increased. Salter was on 8 L with NRB; salter increased to 15 L without improvement. NRB replaced at that time.

## 2020-06-03 LAB — CBC
HCT: 29.1 % — ABNORMAL LOW (ref 39.0–52.0)
Hemoglobin: 9.2 g/dL — ABNORMAL LOW (ref 13.0–17.0)
MCH: 33.9 pg (ref 26.0–34.0)
MCHC: 31.6 g/dL (ref 30.0–36.0)
MCV: 107.4 fL — ABNORMAL HIGH (ref 80.0–100.0)
Platelets: 261 10*3/uL (ref 150–400)
RBC: 2.71 MIL/uL — ABNORMAL LOW (ref 4.22–5.81)
RDW: 17.1 % — ABNORMAL HIGH (ref 11.5–15.5)
WBC: 16.7 10*3/uL — ABNORMAL HIGH (ref 4.0–10.5)
nRBC: 0.3 % — ABNORMAL HIGH (ref 0.0–0.2)

## 2020-06-03 NOTE — Progress Notes (Signed)
PROGRESS NOTE    Peter Wood  TIR:[id number] DOB: 1934/10/09 DOA: 05/25/2020 PCP: Cassandria Anger, MD  Brief Narrative:  85 year old black male prostate cancer status post XRT 2015 Gastric ulcer 1 metastatic adenocarcinoma diagnosed on EGD 09/20/2018 status post chemotherapy-liver mets-Enhertu last given 6/76/1950 Treatment complicated by questionable atypical pneumonia on office visit 05/25/2020-Rx previously COVID-19 Monoclonal antibody + Z-Pak Pulmonary embolism 09/20/2018  Sent to emergency room 9/14 by oncologist because of multifocal lung infiltrates- patient was coordinated for bronchoscopy with pulmonology Patient developed worsening hypoxia 9/19 transferred to stepdown Ultimately palliative care consulted in addition and patient is now DNR and seems to be interested in hospice  Assessment & Plan:   Principal Problem:   Acute respiratory failure with hypoxia ([place]) Active Problems:   Essential hypertension   Pulmonary embolism ([place])   Liver metastases ([place])   Metastasis from malignant tumor of stomach ([place])   Gastric cancer ([place])   Iron deficiency anemia due to chronic blood loss   Multifocal pneumonia   Hypoalbuminemia   Abnormal LFTs   Chronic diastolic CHF (congestive heart failure) ([place])   Advanced care planning/counseling discussion   Palliative care by specialist   1. Acute hypoxic respiratory failure status post bronchoscopy 05/26/2020-unlikely infectious etiology more likely treatment induced pneumonitis/lymphangitis a. Continue supportive management high flow nasal cannula primarily b. Continue tapering Solu-Medrol for 6 days and then oral prednisone subsequently c. Roxanol sublingually 2.54 work of breathing/discomfort for now d. cefepime d/c per Oncology 2. Chronic systolic diastolic heart failure a. Holding all diuretics at this time, continue metoprolol 12.5 twice daily 3. History of PE 09/20/2018 ---continue Eliquis 2.5 twice daily at this  time 4. Gastric adenocarcinoma status post chemotherapy complicated by liver mets a. Greatly appreciate oncology input and delineation of goals of care b. No beds at [facility] today-will plan for d/c in am--Hospice has seen and spoken with patient 5. HTN 6. NSVT-continue metoprolol as above 7. Hypernatremia- no further labs no a other follow-up    DVT prophylaxis: Eliquis Code Status: DNR Family Communication: None present at this time Disposition:   Status is: Inpatient  Remains inpatient appropriate because:Ongoing diagnostic testing needed not appropriate for outpatient work up, Unsafe d/c plan and IV treatments appropriate due to intensity of illness or inability to take PO   Dispo: The patient is from: Home              Anticipated d/c is to: Unclear at this time              Anticipated d/c date is: 1 day              Patient currently is not medically stable to d/c.       Consultants:   Oncology  Palliative care  Procedures: None cefepime  Antimicrobials: cefepime     Subjective:  Awake alert no distress sitting up in bed using about 8 L desats a little bit No chest pain no fever  Objective: Vitals:   06/03/20 0800 06/03/20 0856 06/03/20 1300 06/03/20 1407  BP: (!) 159/107     Pulse:  93    Resp:      Temp: (!) 97.4 F (36.3 C)  97.6 F (36.4 C)   TempSrc: Axillary  Axillary   SpO2:  93%  93%  Weight:      Height:        Intake/Output Summary (Last 24 hours) at 06/03/2020 1518 Last data filed at 06/03/2020 0600 Gross per 24 hour  Intake 60 ml  Output 950 ml  Net -890 ml   Filed Weights   05/25/20 1744 05/26/20 1234  Weight: 61.2 kg 61.2 kg    Examination:  General exam: Awake alert looks comfortable Respiratory system: Clear no rales rhonchi Cardiovascular system: S1-S2 tachycardic, holosystolic murmur Gastrointestinal system: Soft no distention no rebound no guarding. Central nervous system: Intact no focal deficit Extremities:  No lower extremity edema ROM intact Skin: Intact Psychiatry: Euthymic congruent  Data Reviewed: I have personally reviewed following labs and imaging studies  White count 16, hemoglobin 9   Radiology Studies: No results found.   Scheduled Meds: . amLODipine  2.5 mg Oral Daily  . apixaban  2.5 mg Oral BID  . chlorhexidine  15 mL Mouth Rinse BID  . Chlorhexidine Gluconate Cloth  6 each Topical Daily  . docusate sodium  200 mg Oral Daily  . lactose free nutrition  237 mL Oral TID WC  . latanoprost  1 drop Both Eyes QHS  . mouth rinse  15 mL Mouth Rinse q12n4p  . methylPREDNISolone (SOLU-MEDROL) injection  60 mg Intravenous Q8H   Followed by  . [START ON 06/05/2020] methylPREDNISolone (SOLU-MEDROL) injection  60 mg Intravenous Q12H   Followed by  . [START ON 06/08/2020] predniSONE  60 mg Oral Q breakfast  . metoprolol tartrate  12.5 mg Oral BID  . multivitamin with minerals  1 tablet Oral Daily  . polyethylene glycol  17 g Oral Daily   Continuous Infusions:    LOS: 8 days    Time spent: [place], MD [facility] To contact the attending provider between 7A-7P or the covering provider during after hours 7P-7A, please log into the web site [email or web address] and access using universal [place] password for that web site. If you do not have the password, please call the hospital operator.  06/03/2020, 3:18 PM

## 2020-06-03 NOTE — Progress Notes (Signed)
Daily Progress Note   Patient Name: Peter Wood       Date: 06/03/2020 DOB: 12-Apr-1935  Age: 85 y.o. MRN#: [id number] Attending Physician: Nita Sells, MD Primary Care Physician: Cassandria Anger, MD Admit Date: 05/25/2020  Reason for Consultation/Follow-up: Establishing goals of care  Subjective: I met today with Mr. Bishop.  Reviewed his conversation with Dr. Jana Hakim this AM.  His primary concern this morning is finding out about the financials of going to [facility].  I have called to discuss case with them this morning and will ask for TOC to confirm choice/make referral.    I talked with him about next steps and that at [facility] the focus of care is comfort at the very end of life.  He asked about prognosis and I shared that prognosis to be accepted to [facility] is generally 2 weeks.  He stated "I just don't know" when I asked if he was surprised to hear we are likely at the end of life.  He did seem to be accepting of this, however.    Length of Stay: 8  Current Medications: Scheduled Meds:   amLODipine  2.5 mg Oral Daily   apixaban  2.5 mg Oral BID   chlorhexidine  15 mL Mouth Rinse BID   Chlorhexidine Gluconate Cloth  6 each Topical Daily   docusate sodium  200 mg Oral Daily   lactose free nutrition  237 mL Oral TID WC   latanoprost  1 drop Both Eyes QHS   mouth rinse  15 mL Mouth Rinse q12n4p   methylPREDNISolone (SOLU-MEDROL) injection  60 mg Intravenous Q8H   Followed by   Derrill Memo ON 06/05/2020] methylPREDNISolone (SOLU-MEDROL) injection  60 mg Intravenous Q12H   Followed by   Derrill Memo ON 06/08/2020] predniSONE  60 mg Oral Q breakfast   metoprolol tartrate  12.5 mg Oral BID   multivitamin with minerals  1 tablet Oral Daily    polyethylene glycol  17 g Oral Daily    Continuous Infusions:   PRN Meds: acetaminophen, benzonatate, hydrALAZINE, morphine, sodium chloride flush  Physical Exam       General: Alert, awake, in no acute distress.  HEENT: No bruits, no goiter, no JVD Heart: Tachycardic. No murmur appreciated. Lungs: Decreased air movement, clear Abdomen: Soft, nontender, nondistended, positive bowel sounds.  Ext: No significant edema Skin: Warm and dry Neuro: Grossly intact, nonfocal.    Vital Signs: BP (!) 159/107    Pulse 93    Temp (!) 97.4 F (36.3 C) (Axillary)    Resp (!) 21    Ht _0  (1.626 m)    Wt 61.2 kg    SpO2 93%    BMI 23.17 kg/m  SpO2: SpO2: 93 % O2 Device: O2 Device: High Flow Nasal Cannula O2 Flow Rate: O2 Flow Rate (L/min): 15 L/min  Intake/output summary:   Intake/Output Summary (Last 24 hours) at 06/03/2020 1015 Last data filed at 06/03/2020 0600 Gross per 24 hour  Intake 120 ml  Output 950 ml  Net -830 ml   LBM: Last BM Date: 06/01/20 Baseline Weight: Weight: 61.2 kg Most recent weight: Weight: 61.2 kg       Palliative Assessment/Data:      Patient Active Problem List   Diagnosis Date Noted   Advanced care planning/counseling discussion    Palliative care by specialist    Acute respiratory failure with hypoxia (HCC) 05/27/2020   Chronic diastolic CHF (congestive heart failure) ([place]) 05/27/2020   Multifocal pneumonia 05/26/2020   Hypoalbuminemia 05/26/2020   Abnormal LFTs 05/26/2020   Iron deficiency anemia due to chronic blood loss 12/26/2019   Port-A-Cath in place 12/18/2018   Anemia 11/12/2018   Anemia due to antineoplastic chemotherapy 11/06/2018   Genetic testing 10/14/2018   Metastasis from malignant tumor of stomach ([place]) 10/04/2018   Gastric cancer ([place]) 10/04/2018   Family history of prostate cancer    Family history of breast cancer    Aortic atherosclerosis ([place]) 10/03/2018   Acute pulmonary embolus ([place]) 09/20/2018    Pulmonary embolism ([place]) 09/19/2018   Liver metastases ([place]) 09/19/2018   Abdominal neoplasm without bowel obstruction 09/19/2018   Nausea 09/19/2018   Abdominal pain 09/19/2018   Upper respiratory infection 08/01/2018   Varicose vein of leg 07/22/2018   Weight loss 01/18/2018   Claudication ([place]) 01/16/2017   Macular hole, right 09/19/2016   Macular hole, right eye 09/19/2016   Right sciatic nerve pain 07/19/2016   Finger mass, right 06/24/2015   Prostate cancer ([place]) 09/16/2013   Rash and nonspecific skin eruption 02/12/2013   Goals of care, counseling/discussion 09/01/2011   TOBACCO USE, QUIT 08/25/2009   ERECTILE DYSFUNCTION 03/03/2009   PSA, INCREASED 03/03/2009   Essential hypertension 06/10/2007   DIVERTICULOSIS, COLON 06/10/2007   COLONIC POLYPS, HX OF 06/10/2007    Palliative Care Assessment & Plan   Patient Profile: 85 yo male admitted with respiratory failure status post bronc 9/15.  Thought to be possible pneumonitis versus more likely lymphangitic spread.  Palliative following for goals of care.  Assessment: Patient Active Problem List   Diagnosis Date Noted   Advanced care planning/counseling discussion    Palliative care by specialist    Acute respiratory failure with hypoxia ([place]) 05/27/2020   Chronic diastolic CHF (congestive heart failure) ([place]) 05/27/2020   Multifocal pneumonia 05/26/2020   Hypoalbuminemia 05/26/2020   Abnormal LFTs 05/26/2020   Iron deficiency anemia due to chronic blood loss 12/26/2019   Port-A-Cath in place 12/18/2018   Anemia 11/12/2018   Anemia due to antineoplastic chemotherapy 11/06/2018   Genetic testing 10/14/2018   Metastasis from malignant tumor of stomach ([place]) 10/04/2018   Gastric cancer ([place]) 10/04/2018   Family history of prostate cancer    Family history of breast cancer    Aortic atherosclerosis ([place]) 10/03/2018   Acute pulmonary embolus ([place]) 09/20/2018  Pulmonary embolism  ([place]) 09/19/2018   Liver metastases ([place]) 09/19/2018   Abdominal neoplasm without bowel obstruction 09/19/2018   Nausea 09/19/2018   Abdominal pain 09/19/2018   Upper respiratory infection 08/01/2018   Varicose vein of leg 07/22/2018   Weight loss 01/18/2018   Claudication ([place]) 01/16/2017   Macular hole, right 09/19/2016   Macular hole, right eye 09/19/2016   Right sciatic nerve pain 07/19/2016   Finger mass, right 06/24/2015   Prostate cancer ([place]) 09/16/2013   Rash and nonspecific skin eruption 02/12/2013   Goals of care, counseling/discussion 09/01/2011   TOBACCO USE, QUIT 08/25/2009   ERECTILE DYSFUNCTION 03/03/2009   PSA, INCREASED 03/03/2009   Essential hypertension 06/10/2007   DIVERTICULOSIS, COLON 06/10/2007   COLONIC POLYPS, HX OF 06/10/2007   Recommendations/Plan: - Mr. Ouch would like more information from [place] regarding financials of transitioning there before making a decision.  I placed TOC order to request referral for evaluation for [facility] eligibility and to ask liaison to review financials with patient and family.   Code Status:    Code Status Orders  (From admission, onward)         Start     Ordered   05/30/20 1100  Do not attempt resuscitation (DNR)  Continuous       Question Answer Comment  In the event of cardiac or respiratory ARREST Do not call a code blue   In the event of cardiac or respiratory ARREST Do not perform Intubation, CPR, defibrillation or ACLS   In the event of cardiac or respiratory ARREST Use medication by any route, position, wound care, and other measures to relive pain and suffering. May use oxygen, suction and manual treatment of airway obstruction as needed for comfort.      05/30/20 1059        Code Status History    Date Active Date Inactive Code Status Order ID Comments User Context   05/26/2020 0109 05/30/2020 1059 Full Code [id number]  Mikki [place], MD ED     09/19/2018 1718 09/25/2018 1751 Full Code [id number]  Janora Norlander, MD ED   09/19/2016 1634 09/20/2016 1206 Full Code [id number]  [name], MD Inpatient   Advance Care Planning Activity       Prognosis: Agree with Dr. Virgie Dad assessment that his prognosis is likely less than 2 weeks if this is lymphangitic spread and hypoxia will continue to progress.  Discharge Planning:  To Be Determined- ? residential hospice facility  Care plan was discussed with patient  Thank you for allowing the Palliative Medicine Team to assist in the care of this patient.   Time In: 0950 Time Out: 1015 Total Time 25 Prolonged Time Billed No   Greater than 50%  of this time was spent counseling and coordinating care related to the above assessment and plan.  Micheline Rough, MD  Please contact Palliative Medicine Team phone at [phone] for questions and concerns.

## 2020-06-03 NOTE — TOC CAGE-AID Note (Signed)
Transition of Care [facility]) - CAGE-AID Screening   Patient Details  Name: Peter Wood MRN: [id number] Date of Birth: [birth year]  Transition of Care [facility]) CM/SW Contact:    Leeroy Cha, RN Phone [phone] 06/03/2020, 8:17 AM   Clinical Narrative:    CAGE-AID Screening:

## 2020-06-03 NOTE — Progress Notes (Deleted)
Please check in am woith Home hopsice and see if can proivide hi flow canula--wife wants him home not [facility]

## 2020-06-03 NOTE — Progress Notes (Signed)
Engineer, maintenance [facility]) Hospital Liaison note.     Received request from Dr. Domingo Cocking for patient interest in [facility]. Liaison met with patient at bedside to explain services and answer questions. Also left information packet with patient.   [place] is unable to offer a room today. Hospital Liaison will follow up tomorrow or sooner if a room becomes available.     A Please do not hesitate to call with questions.     Thank you,     Farrel Gordon, RN, [facility]        [place] (listed on AMION under [place])      [phone]

## 2020-06-03 NOTE — TOC Progression Note (Signed)
Transition of Care [facility]) - Progression Note    Patient Details  Name: Peter Wood MRN: [id number] Date of Birth: 05/05/1935  Transition of [facility]) CM/SW Contact  Leeroy Cha, RN Phone Number: 06/03/2020, 8:15 AM  Clinical Narrative:    Patient wanting to know finances of [facility] and his insurance coverage.  Will representative from [place] either call or come see patient to discuss.  Repres not on duty until 0830.   Expected Discharge Plan: [place] Barriers to Discharge: Continued Medical Work up  Expected Discharge Plan and Services Expected Discharge Plan: [place] In-house Referral: Chaplain Discharge Planning Services: CM Consult   Living arrangements for the past 2 months: Single Family Home                                       Social Determinants of Health (SDOH) Interventions    Readmission Risk Interventions No flowsheet data found.

## 2020-06-03 NOTE — Progress Notes (Signed)
Palliative care brief progress note.  Called to bedside by RN who reports that patient's wife is present and has questions.  Dr. Verlon Au and I met with patient and his wife.  She reports that they have been discussing as family, and they really want to work to take him home with hospice rather than pursuing [employer].  Discussed pros and cons of home vs residential hospice.  They are clear in his desire to be home rather than [facility].  Reviewed that this would be dependent on the amount of O2 that hospice can deliver at home and we reviewed home hospice benefits.  I called and discussed with hospice liaison who will follow-up with family.  Micheline Rough, MD [place] Palliative Medicine Team [phone]  NO CHARGE NOTE

## 2020-06-03 NOTE — Progress Notes (Signed)
Peter Wood   DOB:[birth year]   KZ#:[id number]   TDD#:[id number]  Subjective:  Peter Wood is sitting up in the recliner this AM, waiting for breakfast. He tells me he has some cough and a little phlegm. Breathing otherwise as before. No family in room   Objective: older African American man examined in recliner Vitals:   06/03/20 0600 06/03/20 0700  BP: (!) 166/94 (!) 152/93  Pulse:    Resp:    Temp:    SpO2: 100%     Body mass index is 23.17 kg/m.  Intake/Output Summary (Last 24 hours) at 06/03/2020 0807 Last data filed at 06/03/2020 0600 Gross per 24 hour  Intake 240 ml  Output 950 ml  Net -710 ml    Continues alert, conversing easily at rest, able to make decisions   CBG (last 3)  No results for input(s): GLUCAP in the last 72 hours.   Labs:  Lab Results  Component Value Date   WBC 16.7 (H) 06/03/2020   HGB 9.2 (L) 06/03/2020   HCT 29.1 (L) 06/03/2020   MCV 107.4 (H) 06/03/2020   PLT 261 06/03/2020   NEUTROABS 7.8 (H) 05/25/2020    _0 @  Urine Studies No results for input(s): UHGB, CRYS in the last 72 hours.  Invalid input(s): UACOL, UAPR, USPG, UPH, UTP, UGL, UKET, UBIL, UNIT, UROB, ULEU, UEPI, UWBC, URBC, UBAC, [place], [place], [place]  Basic Metabolic Panel: Recent Labs  Lab 05/29/20 0331 05/29/20 0331 05/30/20 6237 05/30/20 0338 05/31/20 0429 05/31/20 0429 06/01/20 0333 06/02/20 0513  NA 141  --  146*  --  144  --  147* 143  K 3.8   < > 4.1   < > 3.9   < > 4.2 4.1  CL 102  --  104  --  104  --  103 102  CO2 29  --  31  --  31  --  32 31  GLUCOSE 127*  --  121*  --  110*  --  133* 124*  BUN 31*  --  32*  --  29*  --  36* 37*  CREATININE 0.68  --  0.66  --  0.62  --  0.68 0.54*  CALCIUM 8.8*  --  9.2  --  8.9  --  9.2 9.1  MG 2.3  --   --   --   --   --   --   --    < > = values in this interval not displayed.   GFR Estimated Creatinine Clearance: 59.6 mL/min (A) (by C-G formula based on SCr of 0.54 mg/dL (L)). Liver Function  Tests: Recent Labs  Lab 05/28/20 0420 05/29/20 0331 05/31/20 0429  AST 54* 119* 84*  ALT 43 71* 83*  ALKPHOS 374* 463* 412*  BILITOT 1.2 0.9 1.3*  PROT 5.9* 5.8* 5.6*  ALBUMIN 1.8* 2.1* 2.1*   No results for input(s): LIPASE, AMYLASE in the last 168 hours. No results for input(s): AMMONIA in the last 168 hours. Coagulation profile Recent Labs  Lab 05/28/20 0420  INR 1.8*    CBC: Recent Labs  Lab 05/28/20 0420 05/31/20 0429 06/03/20 0500  WBC 7.5 11.7* 16.7*  HGB 9.1* 8.9* 9.2*  HCT 27.8* 28.5* 29.1*  MCV 103.3* 105.9* 107.4*  PLT 329 297 261   Cardiac Enzymes: No results for input(s): CKTOTAL, CKMB, CKMBINDEX, TROPONINI in the last 168 hours. BNP: Invalid input(s): POCBNP CBG: No results for input(s): GLUCAP in the last 168 hours. D-Dimer No results for  input(s): DDIMER in the last 72 hours. Hgb A1c No results for input(s): HGBA1C in the last 72 hours. Lipid Profile No results for input(s): CHOL, HDL, LDLCALC, TRIG, CHOLHDL, LDLDIRECT in the last 72 hours. Thyroid function studies No results for input(s): TSH, T4TOTAL, T3FREE, THYROIDAB in the last 72 hours.  Invalid input(s): FREET3 Anemia work up No results for input(s): VITAMINB12, FOLATE, FERRITIN, TIBC, IRON, RETICCTPCT in the last 72 hours. Microbiology Recent Results (from the past 240 hour(s))  Culture, Blood     Status: None   Collection Time: 05/25/20  3:52 PM   Specimen: BLOOD  Result Value Ref Range Status   Specimen Description BLOOD LEFT ANTECUBITAL  Final   Special Requests   Final    BOTTLES DRAWN AEROBIC AND ANAEROBIC Blood Culture results may not be optimal due to an excessive volume of blood received in culture bottles   Culture   Final    NO GROWTH 5 DAYS Performed at [facility], [place] [address] [place], [place] [address]    Report Status 05/30/2020 FINAL  Final  Culture, Blood     Status: None   Collection Time: 05/25/20  4:48 PM   Specimen: BLOOD  Result Value Ref  Range Status   Specimen Description BLOOD PORTA CATH  Final   Special Requests   Final    BOTTLES DRAWN AEROBIC AND ANAEROBIC Blood Culture adequate volume   Culture   Final    NO GROWTH 5 DAYS Performed at [facility], [place] [address], [place], [place] [address]    Report Status 05/30/2020 FINAL  Final  SARS Coronavirus 2 by RT PCR (hospital order, performed in [facility] hospital lab) Nasopharyngeal Nasopharyngeal Swab     Status: None   Collection Time: 05/25/20  6:05 PM   Specimen: Nasopharyngeal Swab  Result Value Ref Range Status   SARS Coronavirus 2 NEGATIVE NEGATIVE Final    Comment: (NOTE) SARS-CoV-2 target nucleic acids are NOT DETECTED.  The SARS-CoV-2 RNA is generally detectable in upper and lower respiratory specimens during the acute phase of infection. The lowest concentration of SARS-CoV-2 viral copies this assay can detect is 250 copies / mL. A negative result does not preclude SARS-CoV-2 infection and should not be used as the sole basis for treatment or other patient management decisions.  A negative result may occur with improper specimen collection / handling, submission of specimen other than nasopharyngeal swab, presence of viral mutation(s) within the areas targeted by this assay, and inadequate number of viral copies (<250 copies / mL). A negative result must be combined with clinical observations, patient history, and epidemiological information.  Fact Sheet for Patients:   StrictlyIdeas.no  Fact Sheet for Healthcare Providers: BankingDealers.co.za  This test is not yet approved or  cleared by the [place] FDA and has been authorized for detection and/or diagnosis of SARS-CoV-2 by FDA under an Emergency Use Authorization (EUA).  This EUA will remain in effect (meaning this test can be used) for the duration of the COVID-19 declaration under Section 564(b)(1) of the Act, 21 U.S.C. section  360bbb-3(b)(1), unless the authorization is terminated or revoked sooner.  Performed at [facility] [place] [address] [place], [place] [address]   MRSA PCR Screening     Status: None   Collection Time: 05/25/20 11:45 PM   Specimen: Nasal Mucosa; Nasopharyngeal  Result Value Ref Range Status   MRSA by PCR NEGATIVE NEGATIVE Final    Comment:  The GeneXpert MRSA Assay (FDA approved for NASAL specimens only), is one component of a comprehensive MRSA colonization surveillance program. It is not intended to diagnose MRSA infection nor to guide or monitor treatment for MRSA infections. Performed at [facility], [place] [address] [place], [place] [address]   Acid Fast Smear (AFB)     Status: None   Collection Time: 05/26/20  1:26 PM   Specimen: Bronchial Alveolar Lavage  Result Value Ref Range Status   AFB Specimen Processing Concentration  Final   Acid Fast Smear Negative  Final    Comment: (NOTE) Performed At: [facility] [place], [place] [id number] Rush Farmer MD XL:[id number]    Source (AFB) BRONCHIAL ALVEOLAR LAVAGE  Final    Comment: RML Performed at [facility], [place] [address] [place], [place] [address]   Fungus Culture With Stain     Status: None (Preliminary result)   Collection Time: 05/26/20  1:26 PM   Specimen: Bronchial Alveolar Lavage  Result Value Ref Range Status   Fungus Stain Final report  Final    Comment: (NOTE) Performed At: [facility] [place], [place] [id number] Rush Farmer MD [id number]    Fungus (Mycology) Culture PENDING  Incomplete   Fungal Source BRONCHIAL ALVEOLAR LAVAGE  Final    Comment: RML Performed at [facility], [place] [address], [place], [place] [address]   Pneumocystis smear by DFA     Status: None   Collection Time: 05/26/20  1:26 PM   Specimen: Bronchial Alveolar Lavage; Respiratory  Result  Value Ref Range Status   Specimen Source-PJSRC BRONCHIAL ALVEOLAR LAVAGE  Final    Comment: RML   Pneumocystis jiroveci Ag NEGATIVE  Final    Comment: Performed at [place] Performed at [place] [address], [place], [place] [address]   Fungus Culture Result     Status: None   Collection Time: 05/26/20  1:26 PM  Result Value Ref Range Status   Result 1 Comment  Final    Comment: (NOTE) KOH/Calcofluor preparation:  no fungus observed. Performed At: [facility] [place], [place] [id number] Rush Farmer MD TF:[id number]   Culture, respiratory     Status: None   Collection Time: 05/26/20  1:37 PM   Specimen: Bronchoalveolar Lavage; Respiratory  Result Value Ref Range Status   Specimen Description   Final    BRONCHIAL ALVEOLAR LAVAGE RML Performed at [place] [address] [place], [place] [address]    Special Requests   Final    NONE Performed at [facility], [place] [address] [place], [place] [address]    Gram Stain   Final    MODERATE WBC PRESENT, PREDOMINANTLY MONONUCLEAR NO ORGANISMS SEEN    Culture   Final    NO GROWTH 2 DAYS Performed at [place] [address] [place], [place] [address]    Report Status 05/29/2020 FINAL  Final  Fungus Culture With Stain     Status: None (Preliminary result)   Collection Time: 05/26/20  1:53 PM   Specimen: Bronchial Alveolar Lavage  Result Value Ref Range Status   Fungus Stain Final report  Final    Comment: (NOTE) Performed At: [facility] [place], [place] [id number] Rush Farmer MD YI:[id number]    Fungus (Mycology) Culture PENDING  Incomplete   Fungal Source BRONCHIAL ALVEOLAR LAVAGE  Final  Comment: RUL Performed at [place] [address], [place], [place] [address]   Acid Fast Smear (AFB)     Status: None   Collection Time: 05/26/20  1:53 PM    Specimen: Bronchial Alveolar Lavage  Result Value Ref Range Status   AFB Specimen Processing Concentration  Final   Acid Fast Smear Negative  Final    Comment: (NOTE) Performed At: [facility] [place], [place] [id number] Rush Farmer MD OZ:[id number]    Source (AFB) BRONCHIAL ALVEOLAR LAVAGE  Final    Comment: RUL Performed at [facility], [place] [address], [place], [place] [address]   Fungus Culture Result     Status: None   Collection Time: 05/26/20  1:53 PM  Result Value Ref Range Status   Result 1 Comment  Final    Comment: (NOTE) KOH/Calcofluor preparation:  no fungus observed. Performed At: [facility] [place], [place] [id number] Rush Farmer MD RJ:[id number]   Culture, respiratory     Status: None   Collection Time: 05/26/20  1:54 PM   Specimen: Bronchoalveolar Lavage; Respiratory  Result Value Ref Range Status   Specimen Description   Final    BRONCHIAL ALVEOLAR LAVAGE RUL Performed at [place] [address] [place], [place] [address]    Special Requests   Final    NONE Performed at [facility], [place] [address], [place], [place] [address]    Gram Stain   Final    MODERATE WBC PRESENT, PREDOMINANTLY MONONUCLEAR NO ORGANISMS SEEN    Culture   Final    NO GROWTH 2 DAYS Performed at [place] [address] [place], [place] [address]    Report Status 05/29/2020 FINAL  Final  Pneumocystis smear by DFA     Status: None   Collection Time: 05/26/20  1:54 PM   Specimen: Bronchial Alveolar Lavage; Respiratory  Result Value Ref Range Status   Specimen Source-PJSRC BRONCHIAL ALVEOLAR LAVAGE  Final    Comment: RUL   Pneumocystis jiroveci Ag NEGATIVE  Final    Comment: Performed at [place] [facility] Performed at [facility], [place] [address] [place], [place] [address]   Respiratory Panel by PCR     Status: None    Collection Time: 05/26/20  6:19 PM   Specimen: Nasopharyngeal Swab; Respiratory  Result Value Ref Range Status   Adenovirus NOT DETECTED NOT DETECTED Final   Coronavirus 229E NOT DETECTED NOT DETECTED Final    Comment: (NOTE) The Coronavirus on the Respiratory Panel, DOES NOT test for the novel  Coronavirus (2019 nCoV)    Coronavirus HKU1 NOT DETECTED NOT DETECTED Final   Coronavirus NL63 NOT DETECTED NOT DETECTED Final   Coronavirus OC43 NOT DETECTED NOT DETECTED Final   Metapneumovirus NOT DETECTED NOT DETECTED Final   Rhinovirus / Enterovirus NOT DETECTED NOT DETECTED Final   Influenza A NOT DETECTED NOT DETECTED Final   Influenza B NOT DETECTED NOT DETECTED Final   Parainfluenza Virus 1 NOT DETECTED NOT DETECTED Final   Parainfluenza Virus 2 NOT DETECTED NOT DETECTED Final   Parainfluenza Virus 3 NOT DETECTED NOT DETECTED Final   Parainfluenza Virus 4 NOT DETECTED NOT DETECTED Final   Respiratory Syncytial Virus NOT DETECTED NOT DETECTED Final   Bordetella pertussis NOT DETECTED NOT DETECTED Final   Chlamydophila pneumoniae NOT DETECTED NOT DETECTED Final   Mycoplasma pneumoniae NOT DETECTED NOT DETECTED Final  Comment: Performed at [facility], [place] [address], [place], [place] [address]  MRSA PCR Screening     Status: None   Collection Time: 05/30/20 11:18 AM   Specimen: Nasal Mucosa; Nasopharyngeal  Result Value Ref Range Status   MRSA by PCR NEGATIVE NEGATIVE Final    Comment:        The GeneXpert MRSA Assay (FDA approved for NASAL specimens only), is one component of a comprehensive MRSA colonization surveillance program. It is not intended to diagnose MRSA infection nor to guide or monitor treatment for MRSA infections. Performed at [facility], [place] [address], [place], [place] [address]       Studies:  DG CHEST PORT 1 VIEW  Result Date: 06/01/2020 CLINICAL DATA:  Hypoxia EXAM: PORTABLE CHEST 1 VIEW COMPARISON:  Chest radiograph  May 29, 2020 and chest CT May 30, 2020 FINDINGS: Multifocal airspace opacity consistent with multifocal pneumonia is again noted. Areas of underlying fibrotic change also present, stable. Small right pleural effusion evident. Heart size and pulmonary vascularity are normal. No adenopathy. Port-A-Cath tip is in the superior vena cava. No pneumothorax. Degenerative change noted in each shoulder. IMPRESSION: Persistent multifocal airspace opacity with underlying fibrosis. No new opacity evident. Small right pleural effusion. Stable cardiac silhouette. Port-A-Cath tip in superior vena cava. Electronically Signed   By: Lowella Grip III M.D.   On: 06/01/2020 09:08    Assessment: 85 y.o. [place], [place] man status post gastric biopsy 09/20/2018 showing adenocarcinoma, with a CA 19-9 greater than 65,000; staging studies showing an apparent mass adjacent to the head of the pancreas, innumerable liver lesions, upper abdominal mesenteric and peripancreatic lymphadenopathy, and an isolated sclerotic density in the left iliac bone             (a) genomics requested on gastric biopsy showed the tumor to be HER-2 amplified at 3+, PD-L1 positive, and T p53 mutated.  MSI is stable, mismatch repair status is proficient and the tumor mutational burden is intermediate.  (1) chest CT scan 09/19/2018 shows right lower lobe pulmonary embolism             (a) on intravenous heparin started 09/19/2018, transitioned to apixaban 10/01/2018  (2) genetics testing 10/04/2018: negative             (a) family history of prostate and early breast cancer; patient has a history of prostate cancer  (3) started gemcitabine/Abraxane 10/09/2018, repeated days 1, 8 and 15 of each 28-day cycle             (a) stopped after 1 cycle (3 doses) with reassessment of diagnosis based on CARIS results, noted above  (4) started oxaliplatin, capecitabine and trastuzumab 11/06/2018             (a) baseline echocardiogram 09/20/2018  shows an ejection fraction in the 55-60% range             (b) oxaliplatin and trastuzumab every 21 days started 11/06/2018             (c) capecitabine dose 1000 mg twice daily for 14 days of every 21-day cycle             (d) CT of the abdomen and pelvis 02/11/2019 shows marked improvement in his liver lesions and resolution of periportal adenopathy             (e) tumor markers also show significant response,             (  f) echocardiogram 02/12/2019 shows an ejection fraction in the 60-65% range             (g) CT abd/pelvis show stable/ improved disease, tumor markers nearly normal             (h) oxaliplatin stopped aftwer 04/30/2019 dose             (i) capecitabine stopped after September cycle              (5) trastuzumab maintenance started 05/20/2019, last dose 07/22/2019, with progression  (6) disease progression documented by lab work and CT scan 07/16/2019             (a) resumed FOLFOX 07/29/2019, continuing trastuzumab (every 14 days).             (b) restaging studies after 3 cycles showed evidence of progression (tumor markers equivocal)             (c) FOLFOX discontinued 08/26/2019  (7) started T-DM1 09/30/2019, repeat every 21 days             (a) pembrolizumab added 12/02/2019             (b) echocardiogram 08/25/2019 showed an ejection fraction in the 60-65% range             (c) repeat CT of the abdomen 12/11/2019 shows growth in 2 liver lesions  (8) started [facility] 12/23/2019 at 5.4 mg/kg.             (a) dose increased to 6.1 mg/kg with the third dose and two 6.4 (target dose) 03/17/2020                  (b) Changed to every 4 weeks after receiving 04/28/2020 dose (which was his most recent)  (9) iron deficiency anemia:             (a) received Feraheme 04/22 /2021 and 01/08/2020      (10) pneumonia/ pneumonitis?  (a) patchy infiltrates on CXR 04/28/2020   (i) COVID-19 negative   (ii) received antibodies as outpatient   (iii) s/p Z=pack  (b) worsening  cough, SOB and fever   (i) CT angio 05/25/2020 no PE, worsening bilateral infiltrates c/w atypical infection   (ii) vancomycin, unasyn started 05/25/2020; vanco d/c'd after one dose   (iii) methylprednisolone started 05/27/2020   (iv) cytology from bronchial fluid 05/26/2020 negative for malignancy  (11) transitioning to supportive/comfort care  (a) DNR in place  (b) discharge to [facility] in process   Plan:  Peter Wood would like some information on the out-of-pocket cost of ging to [facility]; I expect it will not be difficult to get that information for him today. I have discussed with his wife [name] and son Peter Wood that I think we have a very limited time here, perhaps 2 weeks, likely less. I have not been quite so direct with Peter Wood but have told him I believe he has cancer in his lungs and I cannot give him any more cancer treatment as he could not tolerate it.  If discharged under Hospice I will be glad to serve as his Hospice-associated MD  Please let me know what I can do to facilitate disposition in this case     Chauncey Cruel, MD 06/03/2020  8:07 AM Medical Oncology and Hematology [facility] [place], [place] [address] Tel. [phone]    Fax. [phone]

## 2020-06-03 NOTE — Progress Notes (Signed)
Pt alert and aware sitting up in chair. Pt states he is happy to see another day. He talked about the goodness of God and all of His blessings that he has poured out on [place]. The chaplain offered caring and supportive presence. We listened to a daily devotion together. We sang sacred music together.  I offered prayers and blessings. Further visits will be offered.

## 2020-06-03 NOTE — TOC Progression Note (Signed)
Transition of Care [facility]) - Progression Note    Patient Details  Name: Peter Wood MRN: [id number] Date of Birth: [birth year]  [facility] CM/SW Contact  Leeroy Cha, RN Phone Number: 06/03/2020, 11:21 AM  Clinical Narrative:    Trixie Dredge is aware of patient and wanting to speak with her.  Will visit today per dr. Domingo Cocking.   Expected Discharge Plan: [place] Barriers to Discharge: Continued Medical Work up  Expected Discharge Plan and Services Expected Discharge Plan: [place] In-house Referral: Chaplain Discharge Planning Services: CM Consult   Living arrangements for the past 2 months: Single Family Home                                       Social Determinants of Health (SDOH) Interventions    Readmission Risk Interventions No flowsheet data found.

## 2020-06-04 ENCOUNTER — Telehealth: Payer: Self-pay | Admitting: Internal Medicine

## 2020-06-04 NOTE — Progress Notes (Signed)
Pt alert and aware sitting up in bed. Pt states he is doing better today.  He has to catch his breath and then continue talking. The chaplain offered caring presence and support. We listened to a daily devotional. I sang sacred music and prayed with pt and offered a parting blessing.

## 2020-06-04 NOTE — Telephone Encounter (Signed)
Yes.  Thank you.

## 2020-06-04 NOTE — Progress Notes (Signed)
PROGRESS NOTE    Peter Wood  MWU:[id number] DOB: 1935/06/04 DOA: 05/25/2020 PCP: Cassandria Anger, MD  Brief Narrative:  85 year old black male prostate cancer status post XRT 2015 Gastric ulcer 1 metastatic adenocarcinoma diagnosed on EGD 09/20/2018 status post chemotherapy-liver mets-Enhertu last given 04/05/3663 Treatment complicated by questionable atypical pneumonia on office visit 05/25/2020-Rx previously COVID-19 Monoclonal antibody + Z-Pak Pulmonary embolism 09/20/2018  Sent to emergency room 9/14 by oncologist because of multifocal lung infiltrates- patient was coordinated for bronchoscopy with pulmonology Patient developed worsening hypoxia 9/19 transferred to stepdown Ultimately palliative care consulted in addition and patient is now DNR and seems to be interested in hospice  Assessment & Plan:   Principal Problem:   Acute respiratory failure with hypoxia ([place]) Active Problems:   Essential hypertension   Pulmonary embolism ([place])   Liver metastases ([place])   Metastasis from malignant tumor of stomach ([place])   Gastric cancer ([place])   Iron deficiency anemia due to chronic blood loss   Multifocal pneumonia   Hypoalbuminemia   Abnormal LFTs   Chronic diastolic CHF (congestive heart failure) ([place])   Advanced care planning/counseling discussion   Palliative care by specialist   1. Acute hypoxic respiratory failure status post bronchoscopy 05/26/2020-unlikely infectious etiology more likely treatment induced pneumonitis/lymphangitis a. High flow nasal cannula oxygen being arranged by hospice b. Continue tapering Solu-Medrol for 6 days and then oral prednisone subsequently c. Roxanol sublingually 2.54 work of breathing/discomfort for now d. cefepime d/c per Oncology e. No new changes today 2. Chronic systolic diastolic heart failure a. Holding all diuretics at this time, continue metoprolol 12.5 twice daily 3. History of PE 09/20/2018 ---continue Eliquis 2.5 twice daily  at this time 4. Gastric adenocarcinoma status post chemotherapy complicated by liver mets a. Goals continue needed now comfort care b. Family interested in discharge home with hospice although they are not set up yet 5. HTN 6. NSVT-continue metoprolol as above 7. Hypernatremia- no further labs no a other follow-up    DVT prophylaxis: Eliquis Code Status: DNR Family Communication: Discussed with wife on telephone for long time Explained medical readiness and inability to keep the patient for prolonged periods of time in the hospital while she sorts out social matters have explained to her we can give her extension up until 9/20 5 AM but then we need to make decisions about placement-she understands and will coordinate with hospice agency Disposition:   Status is: Inpatient  Remains inpatient appropriate because:Ongoing diagnostic testing needed not appropriate for outpatient work up, Unsafe d/c plan and IV treatments appropriate due to intensity of illness or inability to take PO   Dispo: The patient is from: Home              Anticipated d/c is to: Unclear at this time              Anticipated d/c date is: 1 day              Patient currently is not medically stable to d/c.       Consultants:   Oncology  Palliative care  Procedures: None cefepime  Antimicrobials: cefepime     Subjective:  Sitting up coherent no distress eating drinking No chest pain no shortness of breath but is on high flow nasal cannula 15 L at times  Objective: Vitals:   06/04/20 1100 06/04/20 1200 06/04/20 1300 06/04/20 1348  BP: (!) 125/97 118/75 121/81   Pulse: (!) 117     Resp: (!) 27 Marland Kitchen)  24 (!) 29   Temp:  98.1 F (36.7 C)    TempSrc:  Oral    SpO2: 95%   91%  Weight:      Height:        Intake/Output Summary (Last 24 hours) at 06/04/2020 1654 Last data filed at 06/04/2020 6945 Gross per 24 hour  Intake 810 ml  Output 975 ml  Net -165 ml   Filed Weights   05/25/20 1744  05/26/20 1234  Weight: 61.2 kg 61.2 kg    Examination:  Chest clear no added sound no rales no rhonchi Overall looks a little stronger than he has in the past several days Abdomen soft No lower extremity edema ROM to major muscle groups intact   Data Reviewed: I have personally reviewed following labs and imaging studies  No labs performed today   Radiology Studies: No results found.   Scheduled Meds: . amLODipine  2.5 mg Oral Daily  . apixaban  2.5 mg Oral BID  . chlorhexidine  15 mL Mouth Rinse BID  . Chlorhexidine Gluconate Cloth  6 each Topical Daily  . docusate sodium  200 mg Oral Daily  . lactose free nutrition  237 mL Oral TID WC  . latanoprost  1 drop Both Eyes QHS  . mouth rinse  15 mL Mouth Rinse q12n4p  . methylPREDNISolone (SOLU-MEDROL) injection  60 mg Intravenous Q8H   Followed by  . [START ON 06/05/2020] methylPREDNISolone (SOLU-MEDROL) injection  60 mg Intravenous Q12H   Followed by  . [START ON 06/08/2020] predniSONE  60 mg Oral Q breakfast  . metoprolol tartrate  12.5 mg Oral BID  . multivitamin with minerals  1 tablet Oral Daily  . polyethylene glycol  17 g Oral Daily   Continuous Infusions:    LOS: 9 days    Time spent: Arthur, MD [facility] To contact the attending provider between 7A-7P or the covering provider during after hours 7P-7A, please log into the web site [email or web address] and access using universal [place] password for that web site. If you do not have the password, please call the hospital operator.  06/04/2020, 4:54 PM

## 2020-06-04 NOTE — Telephone Encounter (Signed)
    Crystal from [facility] would like to know if Dr Alain Marion will be the attending for patient.  Please call [phone]

## 2020-06-04 NOTE — Progress Notes (Signed)
Daily Progress Note   Patient Name: Peter Wood       Date: 06/04/2020 DOB: [birth year]  Age: 85 y.o. MRN#: [id number] Attending Physician: Nita Sells, MD Primary Care Physician: Cassandria Anger, MD Admit Date: 05/25/2020  Reason for Consultation/Follow-up: Establishing goals of care  Subjective: I saw and examined Peter Wood today.  He remains awake and alert and denies complaints.  When asked he reports he is looking forward to going back home.  I received call from RN that his wife had wanted to talk to me regarding plan for discharge.  She is concerned about him coming home without having adequate equipment and help in place.  I arrived at his room within an hour of phone call requesting visit but his wife had already gone.  Dr. Verlon Au spoke with her on the phone around that time.  I checked into his room again this afternoon and attempted to call but was unable to reach her.  I left a voicemail requesting return call.  Length of Stay: 9  Current Medications: Scheduled Meds:   amLODipine  2.5 mg Oral Daily   apixaban  2.5 mg Oral BID   chlorhexidine  15 mL Mouth Rinse BID   Chlorhexidine Gluconate Cloth  6 each Topical Daily   docusate sodium  200 mg Oral Daily   lactose free nutrition  237 mL Oral TID WC   latanoprost  1 drop Both Eyes QHS   mouth rinse  15 mL Mouth Rinse q12n4p   methylPREDNISolone (SOLU-MEDROL) injection  60 mg Intravenous Q8H   Followed by   Derrill Memo ON 06/05/2020] methylPREDNISolone (SOLU-MEDROL) injection  60 mg Intravenous Q12H   Followed by   Derrill Memo ON 06/08/2020] predniSONE  60 mg Oral Q breakfast   metoprolol tartrate  12.5 mg Oral BID   multivitamin with minerals  1 tablet Oral Daily   polyethylene glycol  17 g Oral  Daily    Continuous Infusions:   PRN Meds: acetaminophen, benzonatate, hydrALAZINE, morphine, sodium chloride flush  Physical Exam       General: Alert, awake, in no acute distress.  HEENT: No bruits, no goiter, no JVD Heart: Tachycardic. No murmur appreciated. Lungs: Decreased air movement, clear Abdomen: Soft, nontender, nondistended, positive bowel sounds.  Ext: No significant edema Skin: Warm and dry Neuro: Grossly intact,  nonfocal.    Vital Signs: BP 121/81    Pulse (!) 117    Temp 98.1 F (36.7 C) (Oral)    Resp (!) 29    Ht 5\' 4"  (1.626 m)    Wt 61.2 kg    SpO2 91%    BMI 23.17 kg/m  SpO2: SpO2: 91 % O2 Device: O2 Device: High Flow Nasal Cannula O2 Flow Rate: O2 Flow Rate (L/min): 12 L/min  Intake/output summary:   Intake/Output Summary (Last 24 hours) at 06/04/2020 1749 Last data filed at 06/04/2020 0948 Gross per 24 hour  Intake 690 ml  Output 475 ml  Net 215 ml   LBM: Last BM Date: 06/01/20 Baseline Weight: Weight: 61.2 kg Most recent weight: Weight: 61.2 kg       Palliative Assessment/Data:    Flowsheet Rows     Most Recent Value  Intake Tab  Referral Department Hospitalist  Unit at Time of Referral ICU  Palliative Care Primary Diagnosis Pulmonary  Date Notified 05/30/20  Palliative Care Type New Palliative care  Reason for referral Clarify Goals of Care  Date of Admission 05/25/20  Date first seen by Palliative Care 05/31/20  # of days Palliative referral response time 1 Day(s)  # of days IP prior to Palliative referral 5  Clinical Assessment  Psychosocial & Spiritual Assessment  Palliative Care Outcomes      Patient Active Problem List   Diagnosis Date Noted   Advanced care planning/counseling discussion    Palliative care by specialist    Acute respiratory failure with hypoxia ([place]) 05/27/2020   Chronic diastolic CHF (congestive heart failure) ([place]) 05/27/2020   Multifocal pneumonia 05/26/2020   Hypoalbuminemia 05/26/2020    Abnormal LFTs 05/26/2020   Iron deficiency anemia due to chronic blood loss 12/26/2019   Port-A-Cath in place 12/18/2018   Anemia 11/12/2018   Anemia due to antineoplastic chemotherapy 11/06/2018   Genetic testing 10/14/2018   Metastasis from malignant tumor of stomach ([place]) 10/04/2018   Gastric cancer ([place]) 10/04/2018   Family history of prostate cancer    Family history of breast cancer    Aortic atherosclerosis ([place]) 10/03/2018   Acute pulmonary embolus ([place]) 09/20/2018   Pulmonary embolism ([place]) 09/19/2018   Liver metastases ([place]) 09/19/2018   Abdominal neoplasm without bowel obstruction 09/19/2018   Nausea 09/19/2018   Abdominal pain 09/19/2018   Upper respiratory infection 08/01/2018   Varicose vein of leg 07/22/2018   Weight loss 01/18/2018   Claudication ([place]) 01/16/2017   Macular hole, right 09/19/2016   Macular hole, right eye 09/19/2016   Right sciatic nerve pain 07/19/2016   Finger mass, right 06/24/2015   Prostate cancer ([place]) 09/16/2013   Rash and nonspecific skin eruption 02/12/2013   Goals of care, counseling/discussion 09/01/2011   TOBACCO USE, QUIT 08/25/2009   ERECTILE DYSFUNCTION 03/03/2009   PSA, INCREASED 03/03/2009   Essential hypertension 06/10/2007   DIVERTICULOSIS, COLON 06/10/2007   COLONIC POLYPS, HX OF 06/10/2007    Palliative Care Assessment & Plan   Patient Profile: 85 yo male admitted with respiratory failure status post bronc 9/15.  Thought to be possible pneumonitis versus more likely lymphangitic spread.  Palliative following for goals of care.  Assessment: Patient Active Problem List   Diagnosis Date Noted   Advanced care planning/counseling discussion    Palliative care by specialist    Acute respiratory failure with hypoxia ([place]) 05/27/2020   Chronic diastolic CHF (congestive heart failure) ([place]) 05/27/2020   Multifocal pneumonia 05/26/2020   Hypoalbuminemia 05/26/2020  Abnormal LFTs  05/26/2020   Iron deficiency anemia due to chronic blood loss 12/26/2019   Port-A-Cath in place 12/18/2018   Anemia 11/12/2018   Anemia due to antineoplastic chemotherapy 11/06/2018   Genetic testing 10/14/2018   Metastasis from malignant tumor of stomach ([place]) 10/04/2018   Gastric cancer ([place]) 10/04/2018   Family history of prostate cancer    Family history of breast cancer    Aortic atherosclerosis ([place]) 10/03/2018   Acute pulmonary embolus ([place]) 09/20/2018   Pulmonary embolism ([place]) 09/19/2018   Liver metastases ([place]) 09/19/2018   Abdominal neoplasm without bowel obstruction 09/19/2018   Nausea 09/19/2018   Abdominal pain 09/19/2018   Upper respiratory infection 08/01/2018   Varicose vein of leg 07/22/2018   Weight loss 01/18/2018   Claudication ([place]) 01/16/2017   Macular hole, right 09/19/2016   Macular hole, right eye 09/19/2016   Right sciatic nerve pain 07/19/2016   Finger mass, right 06/24/2015   Prostate cancer ([place]) 09/16/2013   Rash and nonspecific skin eruption 02/12/2013   Goals of care, counseling/discussion 09/01/2011   TOBACCO USE, QUIT 08/25/2009   ERECTILE DYSFUNCTION 03/03/2009   PSA, INCREASED 03/03/2009   Essential hypertension 06/10/2007   DIVERTICULOSIS, COLON 06/10/2007   COLONIC POLYPS, HX OF 06/10/2007   Recommendations/Plan: -Plan is for home with hospice.  Currently waiting for arrangement of DME with family.  I attempted to call to discuss, but was unable to reach his wife today.  She has been in contact with Dr. Verlon Au as well as hospice liaison and is aware plan is for discharge tomorrow. -He denies any needs or complaints today.  Indicates he is looking forward to being at home with hospice support.  Code Status:    Code Status Orders  (From admission, onward)         Start     Ordered   05/30/20 1100  Do not attempt resuscitation (DNR)  Continuous       Question Answer Comment  In the event of cardiac  or respiratory ARREST Do not call a code blue   In the event of cardiac or respiratory ARREST Do not perform Intubation, CPR, defibrillation or ACLS   In the event of cardiac or respiratory ARREST Use medication by any route, position, wound care, and other measures to relive pain and suffering. May use oxygen, suction and manual treatment of airway obstruction as needed for comfort.      05/30/20 1059        Code Status History    Date Active Date Inactive Code Status Order ID Comments User Context   05/26/2020 0109 05/30/2020 1059 Full Code [id number]  Mikki Harbor, MD ED   09/19/2018 1718 09/25/2018 1751 Full Code [id number]  Janora Norlander, MD ED   09/19/2016 1634 09/20/2016 1206 Full Code [id number]  [name], MD Inpatient   Advance Care Planning Activity       Prognosis: Agree with Dr. Virgie Dad assessment that his prognosis is likely less than 2 weeks if this is lymphangitic spread and hypoxia will continue to progress.  Discharge Planning:  Plan for home with hospice once DME is delivered (likely tomorrow)  Care plan was discussed with patient  Thank you for allowing the Palliative Medicine Team to assist in the care of this patient.   Total Time 15 Prolonged Time Billed No   Greater than 50%  of this time was spent counseling and coordinating care related to the above assessment and plan.  Micheline Rough, MD  Please contact Palliative Medicine Team phone at [phone] for questions and concerns.  ° ° ° ° °

## 2020-06-04 NOTE — Progress Notes (Signed)
Hydrologist [facility]) Hospital Liaison: RN note     Notified by Dr. Domingo Cocking of patient/family request for [facility] services at home after discharge. Chart and patient information under review by [facility] physician. Hospice eligibility confirmed.     Writer spoke with patient at bedside and wife, Peter Wood by phone to initiate education related to hospice philosophy, services and team approach to care. Both patient and wife  verbalized understanding of information given. Per discussion, plan is for discharge to home by  [facility].   Please send signed and completed DNR form home with patient/family. Patient will need prescriptions for discharge comfort medications.      DME needs have been discussed, patient currently has the following equipment in the home: none.  Patient/family requests the following DME for delivery to the home: Oxygen for up to 15L HFLC, hospital bed, walker, W/C and 3N1. [employer] equipment manager has been notified and will contact DME provider to arrange delivery to the home. Home address has been verified and is correct in the chart. Peter Wood  is the family member to contact to arrange time of delivery.      [facility] aware of the above. Please notify [facility] when patient is ready to leave the unit at discharge. (Call [phone] or [phone] after 5pm.) [facility] information and contact numbers given to [facility].       Please call with any hospice related questions.      Thank you for this referral.      Farrel Gordon, RN, [facility] (listed on [place] under [place])    [phone]

## 2020-06-04 NOTE — Telephone Encounter (Signed)
Left Crystal a detailed message of below

## 2020-06-05 MED ORDER — SODIUM CHLORIDE 0.9% FLUSH: 10.0000 mL | INTRAVENOUS | Status: DC | PRN

## 2020-06-05 MED ORDER — METOPROLOL TARTRATE 25 MG PO TABS
12.5000 mg | ORAL_TABLET | Freq: Two times a day (BID) | ORAL | 0 refills | Status: DC
Start: 2020-06-05 — End: 2020-06-06

## 2020-06-05 MED ORDER — METHYLPREDNISOLONE SODIUM SUCC 125 MG IJ SOLR: 60.0000 mg | Freq: Two times a day (BID) | INTRAMUSCULAR | 0 refills | Status: DC

## 2020-06-05 MED ORDER — BOOST PLUS PO LIQD: 237.0000 mL | Freq: Three times a day (TID) | ORAL | 0 refills | Status: DC

## 2020-06-05 MED ORDER — PREDNISONE 20 MG PO TABS
60.0000 mg | ORAL_TABLET | Freq: Every day | ORAL | 0 refills | Status: DC
Start: 2020-06-08 — End: 2020-06-06

## 2020-06-05 MED ORDER — MORPHINE SULFATE 10 MG/5ML PO SOLN
2.5000 mg | ORAL | 0 refills | Status: AC | PRN
Start: 2020-06-05 — End: 2020-06-08

## 2020-06-05 NOTE — Progress Notes (Signed)
[facility] ([facility])  DME is set to be delivered today after noon.  Confirmed with wife to wait at the house for the delivery driver.  Plan is to d/c home Sunday morning first thing.  Hospice plans to meet at their home Sunday morning at 10 am to begin services.  Pt will need ambulance transport home.  Thank you, Venia Carbon RN, BSN, CCRN

## 2020-06-05 NOTE — Progress Notes (Signed)
Daily Progress Note   Patient Name: Peter Wood       Date: 06/05/2020 DOB: 1935/06/06  Age: 85 y.o. MRN#: [id number] Attending Physician: Nita Sells, MD Primary Care Physician: Cassandria Anger, MD Admit Date: 05/25/2020  Reason for Consultation/Follow-up: Establishing goals of care  Subjective: Discussed care plan with Dr. Martha Clan, and hospice liaison.  Venia Carbon from hospice has been in touch with the patient's wife already today and plan is for discharge home tomorrow.  Equipment is to be delivered this afternoon and his wife know somebody needs to be home to receive the equipment.  I met with [name] today.  He was awake but did appear to be a little bit sleepier today than yesterday.  Denies complaints.  Discussed plan for discharge home with hospice tomorrow.  Length of Stay: 10  Current Medications: Scheduled Meds:  . amLODipine  2.5 mg Oral Daily  . apixaban  2.5 mg Oral BID  . chlorhexidine  15 mL Mouth Rinse BID  . Chlorhexidine Gluconate Cloth  6 each Topical Daily  . docusate sodium  200 mg Oral Daily  . lactose free nutrition  237 mL Oral TID WC  . latanoprost  1 drop Both Eyes QHS  . mouth rinse  15 mL Mouth Rinse q12n4p  . methylPREDNISolone (SOLU-MEDROL) injection  60 mg Intravenous Q12H   Followed by  . [START ON 06/08/2020] predniSONE  60 mg Oral Q breakfast  . metoprolol tartrate  12.5 mg Oral BID  . multivitamin with minerals  1 tablet Oral Daily  . polyethylene glycol  17 g Oral Daily    Continuous Infusions:   PRN Meds: acetaminophen, benzonatate, hydrALAZINE, morphine, sodium chloride flush  Physical Exam       General: Alert, awake, in no acute distress. Appears a little more sleepy today. HEENT: No bruits, no  goiter, no JVD Heart: Tachycardic. No murmur appreciated. Lungs: Decreased air movement, clear Abdomen: Soft, nontender, nondistended, positive bowel sounds.  Ext: No significant edema Skin: Warm and dry Neuro: Grossly intact, nonfocal.    Vital Signs: BP (!) 136/91 (BP Location: Right Arm)   Pulse 97   Temp (!) 97.5 F (36.4 C)   Resp 17   Ht _0  (1.626 m)   Wt 61.2 kg   SpO2 96%  BMI 23.17 kg/m  SpO2: SpO2: 96 % O2 Device: O2 Device: NRB O2 Flow Rate: O2 Flow Rate (L/min): 13 L/min  Intake/output summary:   Intake/Output Summary (Last 24 hours) at 06/05/2020 1611 Last data filed at 06/05/2020 0600 Gross per 24 hour  Intake 290 ml  Output 575 ml  Net -285 ml   LBM: Last BM Date: 06/01/20 Baseline Weight: Weight: 61.2 kg Most recent weight: Weight: 61.2 kg       Palliative Assessment/Data:    Flowsheet Rows     Most Recent Value  Intake Tab  Referral Department Hospitalist  Unit at Time of Referral ICU  Palliative Care Primary Diagnosis Pulmonary  Date Notified 05/30/20  Palliative Care Type New Palliative care  Reason for referral Clarify Goals of Care  Date of Admission 05/25/20  Date first seen by Palliative Care 05/31/20  # of days Palliative referral response time 1 Day(s)  # of days IP prior to Palliative referral 5  Clinical Assessment  Psychosocial & Spiritual Assessment  Palliative Care Outcomes      Patient Active Problem List   Diagnosis Date Noted  . Advanced care planning/counseling discussion   . Palliative care by specialist   . Acute respiratory failure with hypoxia ([place]) 05/27/2020  . Chronic diastolic CHF (congestive heart failure) ([place]) 05/27/2020  . Multifocal pneumonia 05/26/2020  . Hypoalbuminemia 05/26/2020  . Abnormal LFTs 05/26/2020  . Iron deficiency anemia due to chronic blood loss 12/26/2019  . Port-A-Cath in place 12/18/2018  . Anemia 11/12/2018  . Anemia due to antineoplastic chemotherapy 11/06/2018  . Genetic  testing 10/14/2018  . Metastasis from malignant tumor of stomach ([place]) 10/04/2018  . Gastric cancer ([place]) 10/04/2018  . Family history of prostate cancer   . Family history of breast cancer   . Aortic atherosclerosis ([place]) 10/03/2018  . Acute pulmonary embolus ([place]) 09/20/2018  . Pulmonary embolism ([place]) 09/19/2018  . Liver metastases ([place]) 09/19/2018  . Abdominal neoplasm without bowel obstruction 09/19/2018  . Nausea 09/19/2018  . Abdominal pain 09/19/2018  . Upper respiratory infection 08/01/2018  . Varicose vein of leg 07/22/2018  . Weight loss 01/18/2018  . Claudication ([place]) 01/16/2017  . Macular hole, right 09/19/2016  . Macular hole, right eye 09/19/2016  . Right sciatic nerve pain 07/19/2016  . Finger mass, right 06/24/2015  . Prostate cancer ([place]) 09/16/2013  . Rash and nonspecific skin eruption 02/12/2013  . Goals of care, counseling/discussion 09/01/2011  . TOBACCO USE, QUIT 08/25/2009  . ERECTILE DYSFUNCTION 03/03/2009  . PSA, INCREASED 03/03/2009  . Essential hypertension 06/10/2007  . DIVERTICULOSIS, COLON 06/10/2007  . COLONIC POLYPS, HX OF 06/10/2007    Palliative Care Assessment & Plan   Patient Profile: 85 yo male admitted with respiratory failure status post bronc 9/15.  Thought to be possible pneumonitis versus more likely lymphangitic spread.  Palliative following for goals of care.  Assessment: Patient Active Problem List   Diagnosis Date Noted  . Advanced care planning/counseling discussion   . Palliative care by specialist   . Acute respiratory failure with hypoxia ([place]) 05/27/2020  . Chronic diastolic CHF (congestive heart failure) ([place]) 05/27/2020  . Multifocal pneumonia 05/26/2020  . Hypoalbuminemia 05/26/2020  . Abnormal LFTs 05/26/2020  . Iron deficiency anemia due to chronic blood loss 12/26/2019  . Port-A-Cath in place 12/18/2018  . Anemia 11/12/2018  . Anemia due to antineoplastic chemotherapy 11/06/2018  . Genetic testing 10/14/2018    . 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 Gastric  cancer ([place]) 10/04/2018  . Family history of prostate cancer   . Family history of breast cancer   . Aortic atherosclerosis ([place]) 10/03/2018  . Acute pulmonary embolus ([place]) 09/20/2018  . Pulmonary embolism ([place]) 09/19/2018  . Liver metastases ([place]) 09/19/2018  . Abdominal neoplasm without bowel obstruction 09/19/2018  . Nausea 09/19/2018  . Abdominal pain 09/19/2018  . Upper respiratory infection 08/01/2018  . Varicose vein of leg 07/22/2018  . Weight loss 01/18/2018  . Claudication ([place]) 01/16/2017  . Macular hole, right 09/19/2016  . Macular hole, right eye 09/19/2016  . Right sciatic nerve pain 07/19/2016  . Finger mass, right 06/24/2015  . Prostate cancer ([place]) 09/16/2013  . Rash and nonspecific skin eruption 02/12/2013  . Goals of care, counseling/discussion 09/01/2011  . TOBACCO USE, QUIT 08/25/2009  . ERECTILE DYSFUNCTION 03/03/2009  . PSA, INCREASED 03/03/2009  . Essential hypertension 06/10/2007  . DIVERTICULOSIS, COLON 06/10/2007  . COLONIC POLYPS, HX OF 06/10/2007   Recommendations/Plan: -Plan is for home with hospice.  DME to be delivered this afternoon.  Plan for discharge early tomorrow with hospice admission visit scheduled for tomorrow morning. -He denies any needs today.  Reports looking forward to being home and seeing family.  Code Status:    Code Status Orders  (From admission, onward)         Start     Ordered   05/30/20 1100  Do not attempt resuscitation (DNR)  Continuous       Question Answer Comment  In the event of cardiac or respiratory ARREST Do not call a "code blue"   In the event of cardiac or respiratory ARREST Do not perform Intubation, CPR, defibrillation or ACLS   In the event of cardiac or respiratory ARREST Use medication by any route, position, wound care, and other measures to relive pain and suffering. May use oxygen, suction and manual treatment of airway  obstruction as needed for comfort.      05/30/20 1059        Code Status History    Date Active Date Inactive Code Status Order ID Comments User Context   05/26/2020 0109 05/30/2020 1059 Full Code [id number]  Mikki Harbor, MD ED   09/19/2018 1718 09/25/2018 1751 Full Code [id number]  Janora Norlander, MD ED   09/19/2016 1634 09/20/2016 1206 Full Code [id number]  [name], MD Inpatient   Advance Care Planning Activity       Prognosis: Agree with Dr. Virgie Dad assessment that his prognosis is likely less than 2 weeks if this is lymphangitic spread and hypoxia will continue to progress.  Discharge Planning: Plan for home with hospice tomorrow  Care plan was discussed with patient, Dr. Nolene Ebbs from hospice, and LCSW.  Also discussed symptom management needs with bedside RN.  Patient currently comfortable.  Thank you for allowing the Palliative Medicine Team to assist in the care of this patient.   Total Time 20 Prolonged Time Billed No    Greater than 50%  of this time was spent counseling and coordinating care related to the above assessment and plan.  Micheline Rough, MD [place] Team [phone]  Micheline Rough, MD  Please contact Palliative Medicine Team phone at [phone] for questions and concerns.

## 2020-06-05 NOTE — Discharge Summary (Signed)
Physician Discharge Summary  Peter Wood RSW:[id number] DOB: [birth year] DOA: 05/25/2020  PCP: Cassandria Anger, MD  Admit date: 05/25/2020 Discharge date: 06/05/2020  Time spent: 20 minutes  Recommendations for Outpatient Follow-up:  1. Patient discharging home with hospice care for end-of-life and comfort management to be followed by PCP for hospice related issues 2. 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)   Gastric cancer (HCC)   Iron deficiency anemia due to chronic blood loss   Multifocal pneumonia   Hypoalbuminemia   Abnormal LFTs   Chronic diastolic CHF (congestive heart failure) ([place])   Advanced care planning/counseling discussion   Palliative care by specialist   Discharge Condition: Guarded  Diet recommendation: Comfort  Filed Weights   05/25/20 1744 05/26/20 1234  Weight: 61.2 kg 61.2 kg    History of present illness:  85 year old black male prostate cancer status post XRT 2015 Gastric ulcer 1 metastatic adenocarcinoma diagnosed on EGD 09/20/2018 status post chemotherapy-liver mets-Enhertu last given 0/93/8182 Treatment complicated by questionable atypical pneumonia on office visit 05/25/2020-Rx previously COVID-19 Monoclonal antibody + Z-Pak Pulmonary embolism 09/20/2018  Sent to emergency room 9/14 by oncologist because of multifocal lung infiltrates- patient was coordinated for bronchoscopy with pulmonology Patient developed worsening hypoxia 9/19 transferred to stepdown Ultimately palliative care consulted in addition and patient is now DNR and seems to be interested in hospice  Hospital Course:  Goals of care were delineated patient was arranged for high flow nasal cannula with  hospice and was placed on tapering Solu-Medrol and then transition to oral prednisone as per [facility] patient was given a hard prescription for Roxanol Diuretics were discontinued He was kept on metoprolol As well as Eliquis for his prior history of PE- It was discussed with family multiple times goals of care and they are coordinating planning for discharge later today versus tomorrow morning when they are capable of accepting him home in terms of arranging for hospice equipment to be delivered Hospice is aware and will coordinate with them   Consultations:  Palliative care  Oncologist  Discharge Exam: Vitals:   06/05/20 0152 06/05/20 0526  BP: 127/88 (!) 130/93  Pulse: 76 79  Resp: 18 18  Temp:    SpO2: 91% 99%    General: Awake alert coherent slightly sleepy but arouses easily on facemask oxygen-breakfast remains untouched although was just delivered He voices no specific complaints of pain or any other issues Cardiovascular: X9-B7 holosystolic murmur Respiratory: Chest clear Abdomen soft no rebound   Discharge Instructions   Discharge Instructions    Diet - low sodium heart healthy   Complete by: As directed    Increase activity slowly   Complete by: As directed      Allergies as of 06/05/2020   No Known Allergies     Medication List    STOP taking these medications   amLODipine 5 MG tablet Commonly known as: NORVASC   azithromycin 250 MG tablet Commonly known as: Zithromax Z-Pak   benazepril 40 MG tablet Commonly known as: LOTENSIN   Cholecalciferol 25 MCG (1000 UT) tablet   lidocaine-prilocaine cream Commonly known as: EMLA   ondansetron 4 MG tablet Commonly known as: Zofran   promethazine-codeine 6.25-10 MG/5ML syrup Commonly known as: PHENERGAN with CODEINE  triamcinolone lotion 0.1 % Commonly known as: KENALOG     TAKE these medications   Airborne Elderberry Chew Chew by mouth daily. X 2 Gummies   apixaban 2.5 MG Tabs tablet Commonly  known as: ELIQUIS Take 1 tablet (2.5 mg total) by mouth 2 (two) times daily.   benzonatate 100 MG capsule Commonly known as: TESSALON Take 1 capsule (100 mg total) by mouth 2 (two) times daily as needed for cough.   lactose free nutrition Liqd Take 237 mLs by mouth 3 (three) times daily with meals.   latanoprost 0.005 % ophthalmic solution Commonly known as: XALATAN Place 1 drop into both eyes at bedtime.   methylPREDNISolone sodium succinate 125 mg/2 mL injection Commonly known as: SOLU-MEDROL Inject 0.96 mLs (60 mg total) into the vein every 12 (twelve) hours.   metoprolol tartrate 25 MG tablet Commonly known as: LOPRESSOR Take 0.5 tablets (12.5 mg total) by mouth 2 (two) times daily.   morphine 10 MG/5ML solution Place 1.3 mLs (2.6 mg total) under the tongue every 2 (two) hours as needed for up to 3 days (shortness of breath).   predniSONE 20 MG tablet Commonly known as: DELTASONE Take 3 tablets (60 mg total) by mouth daily with breakfast. Start taking on: June 08, 2020 What changed:   medication strength  how much to take  These instructions start on June 08, 2020. If you are unsure what to do until then, ask your doctor or other care provider.   sodium chloride flush 0.9 % Soln Commonly known as: NS 10-40 mLs by Intracatheter route as needed (flush).      No Known Allergies    The results of significant diagnostics from this hospitalization (including imaging, microbiology, ancillary and laboratory) are listed below for reference.    Significant Diagnostic Studies: DG Chest 2 View  Result Date: 05/25/2020 CLINICAL DATA:  Cough and fever.  Reported known prostate carcinoma EXAM: CHEST - 2 VIEW COMPARISON:  April 28, 2020 FINDINGS: There is widespread multifocal airspace opacity throughout the lungs bilaterally, most notably in the right upper lobe. There is an equivocal right pleural effusion. Heart is upper normal in size with pulmonary vascularity  normal. Port-A-Cath tip is in the superior vena cava. No appreciable adenopathy. No blastic or lytic bone lesions are appreciable by radiography. IMPRESSION: Widespread airspace opacity bilaterally, more severe on the right than on the left. Suspect atypical organism pneumonia given this overall appearance. Lymphangitic spread of tumor conceivably could present in this manner but is felt to be less likely given this overall appearance. No well-defined nodular opacities noted within the lungs. Port-A-Cath tip in superior vena cava. No adenopathy evident by radiography. Heart upper normal in size. These results will be called to the ordering clinician or representative by the Radiologist Assistant, and communication documented in the PACS or [employer]. Electronically Signed   By: Lowella Grip III M.D.   On: 05/25/2020 15:29   CT CHEST WO CONTRAST  Result Date: 05/30/2020 CLINICAL DATA:  Inpatient. Respiratory failure. Worsening dyspnea. History of metastatic gastric cancer and prostate cancer. EXAM: CT CHEST WITHOUT CONTRAST TECHNIQUE: Multidetector CT imaging of the chest was performed following the standard protocol without IV contrast. COMPARISON:  Chest radiograph from one day prior. 05/25/2020 chest CT angiogram. FINDINGS: Cardiovascular: Top-normal heart size. Moderate pericardial effusion is unchanged. Left anterior descending coronary atherosclerosis. Right internal jugular Port-A-Cath terminates at the cavoatrial junction. Atherosclerotic nonaneurysmal thoracic aorta. Normal caliber pulmonary arteries. Mediastinum/Nodes: Coarsely calcified 1.1 cm left thyroid nodule  is stable. Not clinically significant; no follow-up imaging recommended (ref: J Am Coll Radiol. 2015 Feb;12(2): 143-50). Unremarkable esophagus. No axillary adenopathy. Coarsely calcified nonenlarged bilateral paratracheal, subcarinal, AP window and bilateral hilar lymph nodes compatible with prior granulomatous disease, unchanged.  No pathologically enlarged noncalcified mediastinal or discrete hilar nodes on this noncontrast scan. Lungs/Pleura: No pneumothorax. Small bilateral pleural effusions, right greater than left, possibly loculated on the right, mildly increased bilaterally. There is extensive patchy consolidation and ground-glass opacity throughout both lungs involving all lung lobes, most prominent in the perihilar right lung with associated volume loss, widespread moderate bronchiectasis and architectural distortion. The consolidation and ground-glass opacity is stable to minimally improved since recent 05/25/2020 chest CT angiogram study. These findings are largely new since 09/19/2018 chest CT study. Anterior left upper lobe solid 0.9 cm pulmonary nodule (series 5/image 56) is stable since 05/25/2020 chest CT and new since 09/19/2018 chest CT. Upper abdomen: Multiple indistinct confluent hypodense liver masses, largest 6.1 cm in the right liver (series 2/image 119), not well evaluated on this noncontrast study. Musculoskeletal: No aggressive appearing focal osseous lesions. Mild thoracic spondylosis. IMPRESSION: 1. Extensive patchy consolidation and ground-glass opacity throughout both lungs, stable to minimally improved since recent 05/25/2020 chest CT angiogram study. Widespread moderate bronchiectasis with some parenchymal distortion and volume loss. Findings are essentially new since 09/19/2018 chest CT study and suggest a severe multilobar pneumonia potentially superimposed on an underlying fibrotic interstitial lung disease. 2. Solid 0.9 cm anterior left upper lobe pulmonary nodule, stable since 05/25/2020 chest CT and new since 09/19/2018 chest CT, cannot exclude pulmonary metastasis. 3. Small bilateral pleural effusions, right greater than left, mildly increased bilaterally, potentially loculated on the right. 4. Moderate pericardial effusion, unchanged. 5. Multiple indistinct confluent hypodense liver masses, not well  evaluated on this noncontrast study, compatible with known liver metastases. 6. One vessel coronary atherosclerosis. 7. Aortic Atherosclerosis (ICD10-I70.0). Electronically Signed   By: Ilona Sorrel M.D.   On: 05/30/2020 17:46   CT Angio Chest PE W and/or Wo Contrast  Result Date: 05/25/2020 CLINICAL DATA:  Dyspnea, fevers, elevated D-dimer EXAM: CT ANGIOGRAPHY CHEST WITH CONTRAST TECHNIQUE: Multidetector CT imaging of the chest was performed using the standard protocol during bolus administration of intravenous contrast. Multiplanar CT image reconstructions and MIPs were obtained to evaluate the vascular anatomy. CONTRAST:  92m OMNIPAQUE IOHEXOL 350 MG/ML SOLN COMPARISON:  None. FINDINGS: Cardiovascular: Right internal jugular chest port tip noted within the right atrium. Global cardiac size within normal limits. No significant coronary artery calcification. Small simple appearing pericardial effusion without CT evidence of cardiac tamponade. Central pulmonary arterial enlargement in keeping with changes of pulmonary artery hypertension. There is excellent opacification of the central pulmonary arterial tree without evidence of filling defect to suggest acute pulmonary embolism. The thoracic aorta is unremarkable. Mediastinum/Nodes: No pathologic thoracic adenopathy. Lungs/Pleura: There are extensive bilateral asymmetric airspace infiltrates demonstrating an upper and mid lung zone predominance most in keeping with changes of atypical infection in the acute setting. Small right pleural effusion is present. No pneumothorax. The central airways are widely patent. Upper Abdomen: Low-attenuation lesion is seen within the left hepatic lobe, poorly characterized on this examination. Musculoskeletal: No chest wall abnormality. No acute or significant osseous findings. Review of the MIP images confirms the above findings. IMPRESSION: 1. No evidence of acute pulmonary embolism. 2. Extensive bilateral asymmetric  airspace infiltrates demonstrating an upper and mid lung zone predominance most in keeping with changes of atypical infection in the acute setting. Serologic correlation  for COVID-19 pneumonia may be helpful for further evaluation. 3. Small right pleural effusion. 4. Small simple appearing pericardial effusion without CT evidence of cardiac tamponade. 5. Central pulmonary arterial enlargement in keeping with changes of pulmonary artery hypertension. Electronically Signed   By: Fidela Salisbury MD   On: 05/25/2020 22:05   DG CHEST PORT 1 VIEW  Result Date: 06/01/2020 CLINICAL DATA:  Hypoxia EXAM: PORTABLE CHEST 1 VIEW COMPARISON:  Chest radiograph May 29, 2020 and chest CT May 30, 2020 FINDINGS: Multifocal airspace opacity consistent with multifocal pneumonia is again noted. Areas of underlying fibrotic change also present, stable. Small right pleural effusion evident. Heart size and pulmonary vascularity are normal. No adenopathy. Port-A-Cath tip is in the superior vena cava. No pneumothorax. Degenerative change noted in each shoulder. IMPRESSION: Persistent multifocal airspace opacity with underlying fibrosis. No new opacity evident. Small right pleural effusion. Stable cardiac silhouette. Port-A-Cath tip in superior vena cava. Electronically Signed   By: Lowella Grip III M.D.   On: 06/01/2020 09:08   DG CHEST PORT 1 VIEW  Result Date: 05/29/2020 CLINICAL DATA:  Acute respiratory failure with hypoxia. EXAM: PORTABLE CHEST 1 VIEW COMPARISON:  05/25/2020 FINDINGS: Right chest wall port a catheter tip is at the cavoatrial junction. Stable cardiomediastinal contours. Small to moderate right pleural effusion which appears partially loculated and extends over the lateral right upper lobe. Diffuse interstitial opacities are identified along with right upper lobe, right lower lobe and left lower lobe airspace disease. IMPRESSION: 1. Small to moderate loculated right pleural effusion Stable small to  moderate right pleural effusion. 2. Diffuse interstitial opacities with bilateral multifocal airspace disease compatible with multifocal pneumonia. Electronically Signed   By: Kerby Moors M.D.   On: 05/29/2020 06:32    Microbiology: Recent Results (from the past 240 hour(s))  Acid Fast Smear (AFB)     Status: None   Collection Time: 05/26/20  1:26 PM   Specimen: Bronchial Alveolar Lavage  Result Value Ref Range Status   AFB Specimen Processing Concentration  Final   Acid Fast Smear Negative  Final    Comment: (NOTE) Performed At: [facility] [address] [place], [place] [id number] Rush Farmer MD [id number]    Source (AFB) BRONCHIAL ALVEOLAR LAVAGE  Final    Comment: RML Performed at [facility], [place] [address] [place], [place] [address]   Fungus Culture With Stain     Status: None (Preliminary result)   Collection Time: 05/26/20  1:26 PM   Specimen: Bronchial Alveolar Lavage  Result Value Ref Range Status   Fungus Stain Final report  Final    Comment: (NOTE) Performed At: [facility] [place], [place] [id number] Rush Farmer MD [id number]    Fungus (Mycology) Culture PENDING  Incomplete   Fungal Source BRONCHIAL ALVEOLAR LAVAGE  Final    Comment: RML Performed at [facility], [place] [address] [place], [place] [address]   Pneumocystis smear by DFA     Status: None   Collection Time: 05/26/20  1:26 PM   Specimen: Bronchial Alveolar Lavage; Respiratory  Result Value Ref Range Status   Specimen Source-PJSRC BRONCHIAL ALVEOLAR LAVAGE  Final    Comment: RML   Pneumocystis jiroveci Ag NEGATIVE  Final    Comment: Performed at [place] Performed at [place] [address], [place], [place] [address]   Fungus Culture Result     Status: None   Collection Time: 05/26/20  1:26 PM  Result Value Ref Range Status   Result 1 Comment  Final    Comment:  (NOTE) KOH/Calcofluor preparation:  no fungus observed. Performed At: [facility] [place], [place] [id number] Rush Farmer MD [id number]   Culture, respiratory     Status: None   Collection Time: 05/26/20  1:37 PM   Specimen: Bronchoalveolar Lavage; Respiratory  Result Value Ref Range Status   Specimen Description   Final    BRONCHIAL ALVEOLAR LAVAGE RML Performed at [place] [address], [place], [place] [address]    Special Requests   Final    NONE Performed at [facility], [place] [address] [place], [place] [address]    Gram Stain   Final    MODERATE WBC PRESENT, PREDOMINANTLY MONONUCLEAR NO ORGANISMS SEEN    Culture   Final    NO GROWTH 2 DAYS Performed at [place] [address], [place], [place] [address]    Report Status 05/29/2020 FINAL  Final  Fungus Culture With Stain     Status: None (Preliminary result)   Collection Time: 05/26/20  1:53 PM   Specimen: Bronchial Alveolar Lavage  Result Value Ref Range Status   Fungus Stain Final report  Final    Comment: (NOTE) Performed At: [facility] [place], [place] [id number] Rush Farmer MD [id number]    Fungus (Mycology) Culture PENDING  Incomplete   Fungal Source BRONCHIAL ALVEOLAR LAVAGE  Final    Comment: RUL Performed at [facility], [place] [address] [place], [place] [address]   Acid Fast Smear (AFB)     Status: None   Collection Time: 05/26/20  1:53 PM   Specimen: Bronchial Alveolar Lavage  Result Value Ref Range Status   AFB Specimen Processing Concentration  Final   Acid Fast Smear Negative  Final    Comment: (NOTE) Performed At: [facility] [place], [place] [id number] Rush Farmer MD [id number]    Source (AFB) BRONCHIAL ALVEOLAR LAVAGE  Final    Comment: RUL Performed at [facility], [place] [address] [place], [place]  [address]   Fungus Culture Result     Status: None   Collection Time: 05/26/20  1:53 PM  Result Value Ref Range Status   Result 1 Comment  Final    Comment: (NOTE) KOH/Calcofluor preparation:  no fungus observed. Performed At: [facility] [place], [place] [id number] Rush Farmer MD [id number]   Culture, respiratory     Status: None   Collection Time: 05/26/20  1:54 PM   Specimen: Bronchoalveolar Lavage; Respiratory  Result Value Ref Range Status   Specimen Description   Final    BRONCHIAL ALVEOLAR LAVAGE RUL Performed at [place] [address] [place], [place] [address]    Special Requests   Final    NONE Performed at [facility], [place] [address], [place], [place] [address]    Gram Stain   Final    MODERATE WBC PRESENT, PREDOMINANTLY MONONUCLEAR NO ORGANISMS SEEN    Culture   Final    NO GROWTH 2 DAYS Performed at [place] [address], [place], [place] [address]    Report Status 05/29/2020 FINAL  Final  Pneumocystis smear by DFA     Status: None   Collection Time: 05/26/20  1:54 PM   Specimen: Bronchial Alveolar Lavage; Respiratory  Result Value Ref Range Status  Specimen Source-PJSRC BRONCHIAL ALVEOLAR LAVAGE  Final    Comment: RUL   Pneumocystis jiroveci Ag NEGATIVE  Final    Comment: Performed at [place] Performed at [facility], [place] [address], [place], [place] [address]   Respiratory Panel by PCR     Status: None   Collection Time: 05/26/20  6:19 PM   Specimen: Nasopharyngeal Swab; Respiratory  Result Value Ref Range Status   Adenovirus NOT DETECTED NOT DETECTED Final   Coronavirus 229E NOT DETECTED NOT DETECTED Final    Comment: (NOTE) The Coronavirus on the Respiratory Panel, DOES NOT test for the novel  Coronavirus (2019 nCoV)    Coronavirus HKU1 NOT DETECTED NOT DETECTED Final   Coronavirus NL63 NOT DETECTED NOT DETECTED Final    Coronavirus OC43 NOT DETECTED NOT DETECTED Final   Metapneumovirus NOT DETECTED NOT DETECTED Final   Rhinovirus / Enterovirus NOT DETECTED NOT DETECTED Final   Influenza A NOT DETECTED NOT DETECTED Final   Influenza B NOT DETECTED NOT DETECTED Final   Parainfluenza Virus 1 NOT DETECTED NOT DETECTED Final   Parainfluenza Virus 2 NOT DETECTED NOT DETECTED Final   Parainfluenza Virus 3 NOT DETECTED NOT DETECTED Final   Parainfluenza Virus 4 NOT DETECTED NOT DETECTED Final   Respiratory Syncytial Virus NOT DETECTED NOT DETECTED Final   Bordetella pertussis NOT DETECTED NOT DETECTED Final   Chlamydophila pneumoniae NOT DETECTED NOT DETECTED Final   Mycoplasma pneumoniae NOT DETECTED NOT DETECTED Final    Comment: Performed at [facility], [place]. [address], [place], [place] [address]  MRSA PCR Screening     Status: None   Collection Time: 05/30/20 11:18 AM   Specimen: Nasal Mucosa; Nasopharyngeal  Result Value Ref Range Status   MRSA by PCR NEGATIVE NEGATIVE Final    Comment:        The GeneXpert MRSA Assay (FDA approved for NASAL specimens only), is one component of a comprehensive MRSA colonization surveillance program. It is not intended to diagnose MRSA infection nor to guide or monitor treatment for MRSA infections. Performed at [facility], [place] [address], [place],  [address]      Labs: Basic Metabolic Panel: Recent Labs  Lab 05/30/20 0338 05/31/20 0429 06/01/20 0333 06/02/20 0513  NA 146* 144 147* 143  K 4.1 3.9 4.2 4.1  CL 104 104 103 102  CO2 31 31 32 31  GLUCOSE 121* 110* 133* 124*  BUN 32* 29* 36* 37*  CREATININE 0.66 0.62 0.68 0.54*  CALCIUM 9.2 8.9 9.2 9.1   Liver Function Tests: Recent Labs  Lab 05/31/20 0429  AST 84*  ALT 83*  ALKPHOS 412*  BILITOT 1.3*  PROT 5.6*  ALBUMIN 2.1*   No results for input(s): LIPASE, AMYLASE in the last 168 hours. No results for input(s): AMMONIA in the last 168 hours. CBC: Recent  Labs  Lab 05/31/20 0429 06/03/20 0500  WBC 11.7* 16.7*  HGB 8.9* 9.2*  HCT 28.5* 29.1*  MCV 105.9* 107.4*  PLT 297 261   Cardiac Enzymes: No results for input(s): CKTOTAL, CKMB, CKMBINDEX, TROPONINI in the last 168 hours. BNP: BNP (last 3 results) Recent Labs    05/25/20 1805 05/26/20 1815  BNP 54.7 71.3    ProBNP (last 3 results) No results for input(s): PROBNP in the last 8760 hours.  CBG: No results for input(s): GLUCAP in the last 168 hours.     Signed:  Nita Sells MD   [facility]  06/05/2020, 9:13 AM

## 2020-06-05 NOTE — TOC Transition Note (Signed)
Transition of Care [facility]) - CM/SW Discharge Note   Patient Details  Name: Peter Wood MRN: [id number] Date of Birth: 09-Oct-1934  [facility]) CM/SW Contact:  Lennart Pall, LCSW Phone Number: 06/05/2020, 1:34 PM   Clinical Narrative:    Have spoken with team following this patient and with wife.  All planning for pt to dc home tomorrow via ambulance and under care of [place].  TOC will contact ambulance in the morning to arrange pick up for patient.  No further TOC needs.   Final next level of care: Home w Hospice Care Barriers to Discharge: No Barriers Identified   Patient Goals and CMS Choice Patient states their goals for this hospitalization and ongoing recovery are:: i am not sure CMS Medicare.gov Compare Post Acute Care list provided to:: Patient    Discharge Placement                       Discharge Plan and Services In-house Referral: Chaplain Discharge Planning Services: CM Consult            DME Arranged: 3-N-1, Hospital bed, Oxygen, Walker rolling, Wheelchair manual DME Agency: [facility] [place] Date DME Agency Contacted: 06/04/20   Representative spoke with at DME Agency: Hospice has made referral for all DME HH Arranged: RN [place] Agency: [facility] [place] Determinants of Health (SDOH) Interventions     Readmission Risk Interventions No flowsheet data found.

## 2020-06-06 MED ORDER — HEPARIN SOD (PORK) LOCK FLUSH 100 UNIT/ML IV SOLN
500.0000 [IU] | INTRAVENOUS | Status: AC | PRN
  Administered 2020-06-06: 500 [IU]
  Filled 2020-06-06: qty 5

## 2020-06-06 MED ORDER — SODIUM CHLORIDE 0.9% FLUSH
10.0000 mL | INTRAVENOUS | 0 refills | Status: AC | PRN
Start: 2020-06-06 — End: ?

## 2020-06-06 MED ORDER — METHYLPREDNISOLONE SODIUM SUCC 125 MG IJ SOLR: 60.0000 mg | Freq: Two times a day (BID) | INTRAMUSCULAR | 0 refills | Status: AC

## 2020-06-06 MED ORDER — METOPROLOL TARTRATE 25 MG PO TABS: 12.5000 mg | ORAL_TABLET | Freq: Two times a day (BID) | ORAL | 0 refills | Status: AC

## 2020-06-06 MED ORDER — BOOST PLUS PO LIQD: 237.0000 mL | Freq: Three times a day (TID) | ORAL | 0 refills | Status: AC

## 2020-06-06 MED ORDER — PREDNISONE 20 MG PO TABS: 60.0000 mg | ORAL_TABLET | Freq: Every day | ORAL | 0 refills | Status: AC

## 2020-06-06 NOTE — Progress Notes (Signed)
Spoke with pt's daughter and given all discharge instructions.

## 2020-06-06 NOTE — Plan of Care (Signed)
Patient dc'd all care plans met/discontinued.

## 2020-06-06 NOTE — Progress Notes (Addendum)
Please see note from Venia Carbon from hospice that family states he cannot come home today as they do not have caregivers in place.    I had received voicemail from patient Peter Wood Peter Wood (([phone]), on voicemail overnight.   I called him back this AM and was able to reach him.  Unfortunately, the call dropped shortly after I introduced myself and now goes straight to voicemail.  I left voicemail with my number for return call.  Also attempted to call his wife.  Also went to voicemail.    I spoke with Peter Wood this morning.  He denies complaints.  Focused on getting "more time" but he would not clarify what he meant by this (?more time in the hospital before discharging, ?more time in life, etc).  I think we need to discuss again with family if home with hospice is going to be a viable option for Peter Wood or if having round the clock caregivers in place is (understandably) going to be too difficult for family to arrange.  I still believe that Peter Wood is appropriate for residential hospice if family cannot meet his needs at home.  Await return call from family.  I'm not sure otherwise how to assist at this point.  Please call if there are specific needs with which our team can be of assistance.  Micheline Rough, MD [facility] Palliative Medicine Team [phone]  NO CHARGE NOTE  ADDENDUM: Please see LCSW note from Evette Cristal.  Plan for d/c home today.

## 2020-06-06 NOTE — TOC Progression Note (Signed)
Transition of Care [facility]) - Progression Note    Patient Details  Name: [name] MRN: [id number] Date of Birth: [birth year]  Transition of Care [facility]) CM/SW [place], [place] Phone Number: [phone] 06/06/2020, 9:30 AM  Clinical Narrative:     Dr. Domingo Cocking, palliative care contacted this CSW to update her on patient disposition.  Dr. Domingo Cocking stated the patient's relative Luane School would like the patient to d/c tomorrow 9/27, instead of today 9/26 because the caregivers who will assist at home are not available till tomorrow. Dr. Domingo Cocking stated he would contact Luane School to figure out disposition needs for today.  Expected Discharge Plan: [place] Barriers to Discharge: No Barriers Identified  Expected Discharge Plan and Services Expected Discharge Plan: [place] In-house Referral: Chaplain Discharge Planning Services: CM Consult   Living arrangements for the past 2 months: Single Family Home Expected Discharge Date: 06/06/20               DME Arranged: 3-N-1, Hospital bed, Oxygen, Walker rolling, Wheelchair manual DME Agency: Hospice and [place] Date DME Agency Contacted: 06/04/20   Representative spoke with at DME Agency: Hospice has made referral for all DME HH Arranged: RN [place] Agency: Hospice and [place] Determinants of Health ([place]) Interventions    Readmission Risk Interventions No flowsheet data found.

## 2020-06-06 NOTE — Progress Notes (Addendum)
   Seen examined and ready for home hospice when able to be coordinated  No charge  Verneita Griffes, MD [facility] 7:33 AM

## 2020-06-06 NOTE — Plan of Care (Signed)
°  Problem: Pain Managment: Goal: General experience of comfort will improve 06/06/2020 0044 by Anda Kraft, RN Outcome: Progressing 06/06/2020 0043 by Anda Kraft, RN Outcome: Progressing   Problem: Education: Goal: Knowledge of General Education information will improve Description: Including pain rating scale, medication(s)/side effects and non-pharmacologic comfort measures Outcome: Progressing

## 2020-06-06 NOTE — TOC Progression Note (Addendum)
Transition of Care [facility]) - Progression Note    Patient Details  Name: Peter Wood MRN: [id number] Date of Birth: December 12, 1934  Transition of Care [facility]) CM/SW Contact  Ross Ludwig, [place] Phone Number: 06/06/2020, 9:28 AM  Clinical Narrative:     CSW spoke to patient's wife Peter Wood [phone] and informed that patient will be discharging today.  Patient's wife said she has not been taught how to use th oxygen, CSW informed her that the hospice nurse can help explain things to her.  They were originally scheduled to be at the house at [place], however the patient's wife would prefer later in the day.  CSW was informed by Anderson Malta at [facility], that patient can have nurse come at 2pm.  CSW explained to patient's wife, and she said that would be fine.  CSW to set up EMS transfer for around 12:30pm, for arrival at 2pm.  CSW spoke to patient's grandson Peter Wood [phone], permission given by patient's wife, he was confirming there will be a nurse meeting patient and family at the house once patient arrives.  CSW confirmed the [facility] hospice nurse is scheduled to be there around 2pm.  CSW to set up EMS transport between 12:30pm and 1pm.  CSW to continue to follow throughout discharge planning.    Expected Discharge Plan: [place] Barriers to Discharge: No Barriers Identified  Expected Discharge Plan and Services Expected Discharge Plan: [place] In-house Referral: Chaplain Discharge Planning Services: CM Consult   Living arrangements for the past 2 months: Single Family Home Expected Discharge Date: 06/06/20               DME Arranged: 3-N-1, Hospital bed, Oxygen, Walker rolling, Wheelchair manual DME Agency: Hospice and [place] Date DME Agency Contacted: 06/04/20   Representative spoke with at DME Agency: Hospice has made referral for all DME HH Arranged: RN [place] Agency: Hospice and [place] Determinants of Health ([place]) Interventions    Readmission Risk Interventions No flowsheet data found.

## 2020-06-06 NOTE — Progress Notes (Signed)
[facility])  Spoke with pt wife on Saturday, she agreed for him to d/c Sunday.  Peter Wood (unsure of relation) called our after hours number last night at 10 pm and stated that Peter Wood cannot come home Sunday am that they did not have caregivers in place.  Hospice is ready to admit Peter Wood whenever the d/c details can be confirmed.  Venia Carbon RN, BSN, [place] Hospital Liaison

## 2020-06-06 NOTE — TOC Transition Note (Addendum)
Transition of Care [facility]) - CM/SW Discharge Note   Patient Details  Name: LUCAS WINOGRAD MRN: [id number] Date of Birth: 07/14/1935  Transition of Care [facility]) CM/SW Contact:  Ova Fresh[place] Phone Number:  [phone] 06/06/2020, 11:03 AM   Clinical Narrative:     CSW spoke with Azbell,Shirley (Spouse) [phone], to confirm the family was ready for the patient to come home.  Ms. Villard confirmed everything was ready for the patient at home.  This CSW spoke with Anderson Malta from [employer] to confirm patient's d/c this afternoon.  CSW spoke with Dr. Domingo Cocking to update him on conversation with Ms. Gibbard and [facility].  CSW updated Attending and Unit RN on patient's d/c plan. PTAR transport set up, transportation notes placed in  d/c folder. TOC consult complete.  Final next level of care: Home w Hospice Care Barriers to Discharge: No Barriers Identified   Patient Goals and CMS Choice Patient states their goals for this hospitalization and ongoing recovery are:: i am not sure CMS Medicare.gov Compare Post Acute Care list provided to:: Patient    Discharge Placement                       Discharge Plan and Services In-house Referral: Chaplain Discharge Planning Services: CM Consult            DME Arranged: 3-N-1, Hospital bed, Oxygen, Walker rolling, Wheelchair manual DME Agency: Hospice and [place] Date DME Agency Contacted: 06/04/20   Representative spoke with at DME Agency: Hospice has made referral for all DME HH Arranged: RN [place] Agency: Hospice and [place] Determinants of Health (SDOH) Interventions     Readmission Risk Interventions No flowsheet data found.

## 2020-06-07 ENCOUNTER — Other Ambulatory Visit: Payer: Self-pay

## 2020-06-07 ENCOUNTER — Inpatient Hospital Stay (HOSPITAL_COMMUNITY)
Admission: EM | Admit: 2020-06-07 | Discharge: 2020-07-12 | DRG: 871 | Disposition: E | Attending: Internal Medicine | Admitting: Internal Medicine

## 2020-06-07 ENCOUNTER — Encounter (HOSPITAL_COMMUNITY): Payer: Self-pay | Admitting: *Deleted

## 2020-06-07 ENCOUNTER — Emergency Department (HOSPITAL_COMMUNITY)

## 2020-06-07 DIAGNOSIS — J90 Pleural effusion, not elsewhere classified: Secondary | ICD-10-CM | POA: Diagnosis not present

## 2020-06-07 DIAGNOSIS — Z20822 Contact with and (suspected) exposure to covid-19: Secondary | ICD-10-CM | POA: Diagnosis present

## 2020-06-07 DIAGNOSIS — Z7189 Other specified counseling: Secondary | ICD-10-CM

## 2020-06-07 DIAGNOSIS — Z923 Personal history of irradiation: Secondary | ICD-10-CM

## 2020-06-07 DIAGNOSIS — R0600 Dyspnea, unspecified: Secondary | ICD-10-CM | POA: Diagnosis not present

## 2020-06-07 DIAGNOSIS — Z803 Family history of malignant neoplasm of breast: Secondary | ICD-10-CM | POA: Diagnosis not present

## 2020-06-07 DIAGNOSIS — J189 Pneumonia, unspecified organism: Secondary | ICD-10-CM | POA: Diagnosis not present

## 2020-06-07 DIAGNOSIS — Z8719 Personal history of other diseases of the digestive system: Secondary | ICD-10-CM | POA: Diagnosis not present

## 2020-06-07 DIAGNOSIS — C787 Secondary malignant neoplasm of liver and intrahepatic bile duct: Secondary | ICD-10-CM | POA: Diagnosis present

## 2020-06-07 DIAGNOSIS — M1990 Unspecified osteoarthritis, unspecified site: Secondary | ICD-10-CM | POA: Diagnosis present

## 2020-06-07 DIAGNOSIS — I509 Heart failure, unspecified: Secondary | ICD-10-CM | POA: Diagnosis not present

## 2020-06-07 DIAGNOSIS — J9601 Acute respiratory failure with hypoxia: Secondary | ICD-10-CM | POA: Diagnosis not present

## 2020-06-07 DIAGNOSIS — Z9221 Personal history of antineoplastic chemotherapy: Secondary | ICD-10-CM

## 2020-06-07 DIAGNOSIS — K5730 Diverticulosis of large intestine without perforation or abscess without bleeding: Secondary | ICD-10-CM | POA: Diagnosis present

## 2020-06-07 DIAGNOSIS — A419 Sepsis, unspecified organism: Principal | ICD-10-CM | POA: Diagnosis present

## 2020-06-07 DIAGNOSIS — D539 Nutritional anemia, unspecified: Secondary | ICD-10-CM

## 2020-06-07 DIAGNOSIS — R6520 Severe sepsis without septic shock: Secondary | ICD-10-CM | POA: Diagnosis not present

## 2020-06-07 DIAGNOSIS — Z8042 Family history of malignant neoplasm of prostate: Secondary | ICD-10-CM

## 2020-06-07 DIAGNOSIS — R590 Localized enlarged lymph nodes: Secondary | ICD-10-CM | POA: Diagnosis present

## 2020-06-07 DIAGNOSIS — R404 Transient alteration of awareness: Secondary | ICD-10-CM | POA: Diagnosis not present

## 2020-06-07 DIAGNOSIS — R7989 Other specified abnormal findings of blood chemistry: Secondary | ICD-10-CM | POA: Diagnosis present

## 2020-06-07 DIAGNOSIS — C7800 Secondary malignant neoplasm of unspecified lung: Secondary | ICD-10-CM | POA: Diagnosis not present

## 2020-06-07 DIAGNOSIS — R54 Age-related physical debility: Secondary | ICD-10-CM | POA: Diagnosis present

## 2020-06-07 DIAGNOSIS — C799 Secondary malignant neoplasm of unspecified site: Secondary | ICD-10-CM

## 2020-06-07 DIAGNOSIS — Z8546 Personal history of malignant neoplasm of prostate: Secondary | ICD-10-CM

## 2020-06-07 DIAGNOSIS — Z79899 Other long term (current) drug therapy: Secondary | ICD-10-CM

## 2020-06-07 DIAGNOSIS — Z8249 Family history of ischemic heart disease and other diseases of the circulatory system: Secondary | ICD-10-CM | POA: Diagnosis not present

## 2020-06-07 DIAGNOSIS — Z7952 Long term (current) use of systemic steroids: Secondary | ICD-10-CM

## 2020-06-07 DIAGNOSIS — E43 Unspecified severe protein-calorie malnutrition: Secondary | ICD-10-CM | POA: Diagnosis not present

## 2020-06-07 DIAGNOSIS — R52 Pain, unspecified: Secondary | ICD-10-CM | POA: Diagnosis not present

## 2020-06-07 DIAGNOSIS — E870 Hyperosmolality and hypernatremia: Secondary | ICD-10-CM

## 2020-06-07 DIAGNOSIS — Z515 Encounter for palliative care: Secondary | ICD-10-CM

## 2020-06-07 DIAGNOSIS — Z789 Other specified health status: Secondary | ICD-10-CM | POA: Diagnosis not present

## 2020-06-07 DIAGNOSIS — I10 Essential (primary) hypertension: Secondary | ICD-10-CM | POA: Diagnosis not present

## 2020-06-07 DIAGNOSIS — Z66 Do not resuscitate: Secondary | ICD-10-CM | POA: Diagnosis present

## 2020-06-07 DIAGNOSIS — Z86711 Personal history of pulmonary embolism: Secondary | ICD-10-CM | POA: Diagnosis not present

## 2020-06-07 DIAGNOSIS — Z87891 Personal history of nicotine dependence: Secondary | ICD-10-CM

## 2020-06-07 DIAGNOSIS — I110 Hypertensive heart disease with heart failure: Secondary | ICD-10-CM | POA: Diagnosis present

## 2020-06-07 DIAGNOSIS — I5032 Chronic diastolic (congestive) heart failure: Secondary | ICD-10-CM | POA: Diagnosis not present

## 2020-06-07 DIAGNOSIS — D509 Iron deficiency anemia, unspecified: Secondary | ICD-10-CM | POA: Diagnosis present

## 2020-06-07 DIAGNOSIS — R0602 Shortness of breath: Secondary | ICD-10-CM | POA: Diagnosis not present

## 2020-06-07 DIAGNOSIS — Z7901 Long term (current) use of anticoagulants: Secondary | ICD-10-CM

## 2020-06-07 DIAGNOSIS — R000 Tachycardia, unspecified: Secondary | ICD-10-CM | POA: Diagnosis not present

## 2020-06-07 DIAGNOSIS — Z6823 Body mass index (BMI) 23.0-23.9, adult: Secondary | ICD-10-CM

## 2020-06-07 DIAGNOSIS — Z95828 Presence of other vascular implants and grafts: Secondary | ICD-10-CM

## 2020-06-07 DIAGNOSIS — C169 Malignant neoplasm of stomach, unspecified: Secondary | ICD-10-CM | POA: Diagnosis present

## 2020-06-07 DIAGNOSIS — I213 ST elevation (STEMI) myocardial infarction of unspecified site: Secondary | ICD-10-CM | POA: Diagnosis not present

## 2020-06-07 DIAGNOSIS — R0902 Hypoxemia: Secondary | ICD-10-CM | POA: Diagnosis not present

## 2020-06-07 LAB — CBC WITH DIFFERENTIAL/PLATELET
Abs Immature Granulocytes: 0.28 10*3/uL — ABNORMAL HIGH (ref 0.00–0.07)
Basophils Absolute: 0 10*3/uL (ref 0.0–0.1)
Basophils Relative: 0 %
Eosinophils Absolute: 0 10*3/uL (ref 0.0–0.5)
Eosinophils Relative: 0 %
HCT: 31.4 % — ABNORMAL LOW (ref 39.0–52.0)
Hemoglobin: 9.9 g/dL — ABNORMAL LOW (ref 13.0–17.0)
Immature Granulocytes: 1 %
Lymphocytes Relative: 4 %
Lymphs Abs: 1 10*3/uL (ref 0.7–4.0)
MCH: 33.9 pg (ref 26.0–34.0)
MCHC: 31.5 g/dL (ref 30.0–36.0)
MCV: 107.5 fL — ABNORMAL HIGH (ref 80.0–100.0)
Monocytes Absolute: 1 10*3/uL (ref 0.1–1.0)
Monocytes Relative: 4 %
Neutro Abs: 24.1 10*3/uL — ABNORMAL HIGH (ref 1.7–7.7)
Neutrophils Relative %: 91 %
Platelets: 249 10*3/uL (ref 150–400)
RBC: 2.92 MIL/uL — ABNORMAL LOW (ref 4.22–5.81)
RDW: 17.2 % — ABNORMAL HIGH (ref 11.5–15.5)
WBC: 26.4 10*3/uL — ABNORMAL HIGH (ref 4.0–10.5)
nRBC: 0.3 % — ABNORMAL HIGH (ref 0.0–0.2)

## 2020-06-07 LAB — COMPREHENSIVE METABOLIC PANEL
ALT: 163 U/L — ABNORMAL HIGH (ref 0–44)
AST: 78 U/L — ABNORMAL HIGH (ref 15–41)
Albumin: 2.4 g/dL — ABNORMAL LOW (ref 3.5–5.0)
Alkaline Phosphatase: 489 U/L — ABNORMAL HIGH (ref 38–126)
Anion gap: 12 (ref 5–15)
BUN: 32 mg/dL — ABNORMAL HIGH (ref 8–23)
CO2: 31 mmol/L (ref 22–32)
Calcium: 9.4 mg/dL (ref 8.9–10.3)
Chloride: 106 mmol/L (ref 98–111)
Creatinine, Ser: 0.55 mg/dL — ABNORMAL LOW (ref 0.61–1.24)
GFR calc Af Amer: >60 mL/min (ref >60)
GFR calc non Af Amer: >60 mL/min (ref >60)
Glucose, Bld: 122 mg/dL — ABNORMAL HIGH (ref 70–99)
Potassium: 4.2 mmol/L (ref 3.5–5.1)
Sodium: 149 mmol/L — ABNORMAL HIGH (ref 135–145)
Total Bilirubin: 1 mg/dL (ref 0.3–1.2)
Total Protein: 5.7 g/dL — ABNORMAL LOW (ref 6.5–8.1)

## 2020-06-07 LAB — LACTIC ACID, PLASMA
Lactic Acid, Venous: 2.3 mmol/L (ref 0.5–1.9)
Lactic Acid, Venous: 2 mmol/L (ref 0.5–1.9)

## 2020-06-07 LAB — RESPIRATORY PANEL BY RT PCR (FLU A&B, COVID)
Influenza A by PCR: NEGATIVE
Influenza B by PCR: NEGATIVE
SARS Coronavirus 2 by RT PCR: NEGATIVE

## 2020-06-07 LAB — PROTIME-INR
INR: 1.5 — ABNORMAL HIGH (ref 0.8–1.2)
Prothrombin Time: 17.8 seconds — ABNORMAL HIGH (ref 11.4–15.2)

## 2020-06-07 LAB — APTT: aPTT: 27 seconds (ref 24–36)

## 2020-06-07 MED ORDER — SODIUM CHLORIDE 0.9 % IV SOLN
500.0000 mg | Freq: Once | INTRAVENOUS | Status: AC
  Administered 2020-06-07: 500 mg via INTRAVENOUS
  Filled 2020-06-07: qty 500

## 2020-06-07 MED ORDER — SODIUM CHLORIDE 0.9 % IV SOLN
2.0000 g | Freq: Two times a day (BID) | INTRAVENOUS | Status: DC
  Administered 2020-06-07 – 2020-06-09 (×5): 2 g via INTRAVENOUS
  Filled 2020-06-07 (×5): qty 2

## 2020-06-07 MED ORDER — LACTATED RINGERS IV SOLN: INTRAVENOUS | Status: AC

## 2020-06-07 MED ORDER — SODIUM CHLORIDE 0.9 % IV SOLN
2.0000 g | Freq: Once | INTRAVENOUS | Status: AC
  Administered 2020-06-07: 2 g via INTRAVENOUS
  Filled 2020-06-07: qty 20

## 2020-06-07 MED ORDER — ONDANSETRON HCL 4 MG PO TABS: 4.0000 mg | ORAL_TABLET | Freq: Four times a day (QID) | ORAL | Status: DC | PRN

## 2020-06-07 MED ORDER — HYDROCODONE-ACETAMINOPHEN 5-325 MG PO TABS: 1.0000 | ORAL_TABLET | ORAL | Status: DC | PRN

## 2020-06-07 MED ORDER — ONDANSETRON HCL 4 MG/2ML IJ SOLN: 4.0000 mg | Freq: Four times a day (QID) | INTRAMUSCULAR | Status: DC | PRN

## 2020-06-07 MED ORDER — VANCOMYCIN HCL 1250 MG/250ML IV SOLN
1250.0000 mg | Freq: Once | INTRAVENOUS | Status: AC
  Administered 2020-06-07: 1250 mg via INTRAVENOUS
  Filled 2020-06-07: qty 250

## 2020-06-07 MED ORDER — LACTATED RINGERS IV BOLUS (SEPSIS)
1000.0000 mL | Freq: Once | INTRAVENOUS | Status: AC
  Administered 2020-06-07: 1000 mL via INTRAVENOUS

## 2020-06-07 MED ORDER — ACETAMINOPHEN 325 MG PO TABS: 650.0000 mg | ORAL_TABLET | Freq: Four times a day (QID) | ORAL | Status: DC | PRN

## 2020-06-07 MED ORDER — VANCOMYCIN HCL IN DEXTROSE 1-5 GM/200ML-% IV SOLN
1000.0000 mg | INTRAVENOUS | Status: DC
  Administered 2020-06-08: 1000 mg via INTRAVENOUS
  Filled 2020-06-07: qty 200

## 2020-06-07 MED ORDER — APIXABAN 2.5 MG PO TABS
2.5000 mg | ORAL_TABLET | Freq: Two times a day (BID) | ORAL | Status: DC
  Administered 2020-06-08 – 2020-06-09 (×5): 2.5 mg via ORAL
  Filled 2020-06-07 (×6): qty 1

## 2020-06-07 MED ORDER — SODIUM CHLORIDE 0.9 % IV SOLN: INTRAVENOUS | Status: DC

## 2020-06-07 MED ORDER — VANCOMYCIN HCL IN DEXTROSE 1-5 GM/200ML-% IV SOLN
1000.0000 mg | Freq: Once | INTRAVENOUS | Status: AC
  Administered 2020-06-08: 1000 mg via INTRAVENOUS
  Filled 2020-06-07: qty 200

## 2020-06-07 MED ORDER — LATANOPROST 0.005 % OP SOLN
1.0000 [drp] | Freq: Every day | OPHTHALMIC | Status: DC
  Administered 2020-06-08 – 2020-06-10 (×2): 1 [drp] via OPHTHALMIC
  Filled 2020-06-07 (×4): qty 2.5

## 2020-06-07 MED ORDER — SODIUM CHLORIDE 0.9 % IV SOLN
2.0000 g | Freq: Once | INTRAVENOUS | Status: AC
  Administered 2020-06-08: 2 g via INTRAVENOUS
  Filled 2020-06-07: qty 2

## 2020-06-07 MED ORDER — LACTATED RINGERS IV BOLUS
500.0000 mL | Freq: Once | INTRAVENOUS | Status: AC
  Administered 2020-06-07: 500 mL via INTRAVENOUS

## 2020-06-07 MED ORDER — ACETAMINOPHEN 650 MG RE SUPP: 650.0000 mg | Freq: Four times a day (QID) | RECTAL | Status: DC | PRN

## 2020-06-07 NOTE — TOC Initial Note (Addendum)
Transition of Care [facility]) - Initial/Assessment Note    Patient Details  Name: Peter Wood MRN: [id number] Date of Birth: 17-Apr-1935  Transition of Care [facility]) CM/SW Contact:    Erenest Rasher, RN Phone Number: [phone] 06/01/2020, 2:55 PM  Clinical Narrative:                 TOC CM spoke to pt's wife and gave permission to speak to grandson, Luane School. Wife states she is looking to hire 24 hour caregiver. Explained that custodial care is an out of pocket expense. States she wanted CM to contact grandson. Contacted [facility], Trixie Dredge, and states Palliative has discussed with family options for care post dc. States they do have a bed at [facility]. Contacted grandson and states he will give CM a call back to discuss, he is currently at work.    4:28 pm TOC CM reviewed options with grandson, Luane School. States he will follow up on company that will provider 24 hour caregiver in the home. States he would like to discuss with family [facility]. Will give TOC CM a call back on tomorrow.   Expected Discharge Plan: Home w Hospice Care Barriers to Discharge: Continued Medical Work up   Patient Goals and CMS Choice Patient states their goals for this hospitalization and ongoing recovery are:: goal is for patient to come home with Home Hospice CMS Medicare.gov Compare Post Acute Care list provided to:: Patient Represenative (must comment) (wife- Gean Larose) Choice offered to / list presented to : Spouse  Expected Discharge Plan and Services Expected Discharge Plan: Home w Hospice Care In-house Referral: Clinical Social Work Discharge Planning Services: CM Consult Post Acute Care Choice: Hospice Living arrangements for the past 2 months: [place] Expected Discharge Date:  (unknown)                 DME Agency: Hospice and [place]                  Prior Living Arrangements/Services Living  arrangements for the past 2 months: [place] with:: Spouse Patient language and need for interpreter reviewed:: Yes Do you feel safe going back to the place where you live?: Yes      Need for Family Participation in Patient Care: Yes (Comment) Care giver support system in place?: Yes (comment) Current home services: DME (oxygen, hospital bed, rolling walker, wheelchair, 3n1 bedside commode) Criminal Activity/Legal Involvement Pertinent to Current Situation/Hospitalization: No - Comment as needed  Activities of Daily Living Home Assistive Devices/Equipment: Bedside commode/3-in-1, Eyeglasses, Hospital bed, Oxygen, Walker (specify type), Wheelchair (manual wheelchair, front wheeled walker) ADL Screening (condition at time of admission) Patient's cognitive ability adequate to safely complete daily activities?: No (very sleepy) Is the patient deaf or have difficulty hearing?: No Does the patient have difficulty seeing, even when wearing glasses/contacts?: No Does the patient have difficulty concentrating, remembering, or making decisions?: Yes Patient able to express need for assistance with ADLs?: No Does the patient have difficulty dressing or bathing?: Yes Independently performs ADLs?: No Communication: Independent Dressing (OT): Needs assistance Is this a change from baseline?: Pre-admission baseline Grooming: Needs assistance Is this a change from baseline?: Pre-admission baseline Feeding: Needs assistance Is this a change from baseline?: Pre-admission baseline Bathing: Needs assistance Is this a change from baseline?: Pre-admission baseline Toileting: Needs assistance Is this a change from baseline?: Pre-admission baseline In/Out Bed: Needs assistance Is this a change  from baseline?: Pre-admission baseline Walks in Home: Needs assistance Is this a change from baseline?: Pre-admission baseline Does the patient have difficulty walking or climbing stairs?: Yes  (secondary to weakness) Weakness of Legs: Both Weakness of Arms/Hands: Both  Permission Sought/Granted Permission sought to share information with : Case Manager, PCP, Family Supports Permission granted to share information with : Yes, Verbal Permission Granted  Share Information with NAME: Bronc Brosseau, [place] granted to share info w AGENCY: [place] granted to share info w Relationship: spouse, Eugene Gavia  Permission granted to share info w Contact Information: [phone], [phone]  Emotional Assessment   Attitude/Demeanor/Rapport: Unable to Assess Affect (typically observed): Unable to Assess     Psych Involvement: No (comment)  Admission diagnosis:  Sepsis [facility]) [A41.9] Patient Active Problem List   Diagnosis Date Noted  . Sepsis ([place]) 05/27/2020  . Advanced care planning/counseling discussion   . Palliative care by specialist   . Acute respiratory failure with hypoxia ([place]) 05/27/2020  . Chronic diastolic CHF (congestive heart failure) ([place]) 05/27/2020  . Multifocal pneumonia 05/26/2020  . Hypoalbuminemia 05/26/2020  . Abnormal LFTs 05/26/2020  . Iron deficiency anemia due to chronic blood loss 12/26/2019  . Port-A-Cath in place 12/18/2018  . Anemia 11/12/2018  . Anemia due to antineoplastic chemotherapy 11/06/2018  . Genetic testing 10/14/2018  . Metastasis from malignant tumor of stomach ([place]) 10/04/2018  . Gastric cancer ([place]) 10/04/2018  . Family history of prostate cancer   . Family history of breast cancer   . Aortic atherosclerosis ([place]) 10/03/2018  . Acute pulmonary embolus ([place]) 09/20/2018  . Pulmonary embolism ([place]) 09/19/2018  . Liver metastases ([place]) 09/19/2018  . Abdominal neoplasm without bowel obstruction 09/19/2018  . Nausea 09/19/2018  . Abdominal pain 09/19/2018  . Upper respiratory infection 08/01/2018  . Varicose vein of leg 07/22/2018  . Weight loss 01/18/2018  . Claudication ([place]) 01/16/2017  .  Macular hole, right 09/19/2016  . Macular hole, right eye 09/19/2016  . Right sciatic nerve pain 07/19/2016  . Finger mass, right 06/24/2015  . Prostate cancer ([place]) 09/16/2013  . Rash and nonspecific skin eruption 02/12/2013  . Goals of care, counseling/discussion 09/01/2011  . TOBACCO USE, QUIT 08/25/2009  . ERECTILE DYSFUNCTION 03/03/2009  . PSA, INCREASED 03/03/2009  . Essential hypertension 06/10/2007  . DIVERTICULOSIS, COLON 06/10/2007  . COLONIC POLYPS, HX OF 06/10/2007   PCP:  Cassandria Anger, MD Pharmacy:   [facility] # [address] [place] [address] [place] [address] Phone: [phone] Fax: [phone]     Social Determinants of Health (SDOH) Interventions    Readmission Risk Interventions No flowsheet data found.

## 2020-06-07 NOTE — Progress Notes (Signed)
Notified bedside nurse of need to administer antibiotics.  

## 2020-06-07 NOTE — ED Triage Notes (Signed)
BIB EMS after recent discharge with Pneumonia followed by Hospice, He received ? Too much Morphine this morning dropping sats and resp status. He was without 02 when found. Hospice was called by EMS and was told to bring him to the hospital. Non re breather 92%, End tidal 27. Fever 103, 112-CBG 167-142/90-26  #18 L AC and has port not accessed. 52ml NS given. Pt refused Tylenol.

## 2020-06-07 NOTE — Consult Note (Signed)
Palliative care consult note  Reason for consult: Goals of care in light of metastatic disease with likely lymphangitic spread.  Of note, he was discharged from [facility] and transitioned home with hospice services yesterday.  Palliative care consult received.  Chart reviewed including personal review of pertinent labs and imaging.  I know Peter Wood and his family well from prior admission.  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  He was discharged home with hospice services yesterday but presented to the emergency room after he developed fever at home and was sleepier and family called EMS.  Palliative consulted for goals of care.  I met with Peter Wood and his family (wife in room and other members via phone) in conjunction with Farrel Gordon from [employer].  His grandson, Peter Wood, and some of his children joined [place] via speaker phone.  I initially met with family to discuss options for care moving forward.  We discussed Peter Wood advanced cancer and concern that this is respiratory failure related to lymphangitic spread.  Discussed that he does have fever as well as elevated white count (could also be due to steroids) and concern from family if there could be component of infection going on as well.  I discussed with him that even if there is infection, this is something that would be a long-term problem as it will recur as soon as antibiotics are discontinued and it does not fix the fact that the underlying disease is untreatable spread of his cancer.  We discussed options for care including:  1) admission to the hospital and treatment for possible infection/pneumonia (reviewed  burdens associated with this including limited visitation; the fact that even if this is treated, his longer term problems will remain due to advanced nature of his cancer; pneumonia may recur as soon as he stops antibiotics; and he may reach a point of being too unstable to transition from the hospital and have a hospital death) 2) working to transition back to home with support of hospice today (concern remains that family would not be able to meet his care needs at home.  We discussed what hospice support would be available and that it is not possible to have a 24-hour caregiver provided through hospice for an indefinite period of time) 3) transition to [facility] for end-of-life care (discussed that antibiotics would not be continued and prognosis would likely be short, however, we also discussed short prognosis is likely regardless of treatment of pneumonia here in the hospital were not)  Family requested time to discuss options.  I stepped out and gave them time to formulate questions and discuss.  On rejoining the family, there were more members of the family on conference call.  We therefore reviewed the above options again.  Family requested more information about each of these options and I answered their questions to the best of my ability.  Following discussion, consensus from family was that they feel that he should be treated for potential infection and see how he does over the next 48 hours prior to making further decisions about longer term care plan.  We again reviewed that this infection is strongly tied to the larger underlying problem with advanced cancer and that even with treatment, prognosis is limited  and another infection or complication will likely recur as soon as antibiotics are discontinued.  Family expressed understanding severity of his situation.  We also discussed limitations of care.  Peter Wood agrees that he would like treatment for potentially treatable conditions,  however, he is also in agreement that he would not want heroic interventions at the time of respiratory or cardiac arrest.  - DNR/DNI - Family would like treatment for suspected PNA/infection.  Discussed plan for admission and plan to reassess his clinical situation in 24-48 hours.  If no improvement, strong recommendation would be for full comfort care.  If he does improve, we discussed that this will likely be a very temporary improvement as he has likely lymphangitic spread and is also at risk for redeveloping pneumonia as soon as antibiotics are stopped.  I was very frank with him that I believe we are in the last couple weeks of life regardless of interventions moving forward.  If he does improve with treatment for infection, we will still need to figure out the best option for him for end-of-life care.  Based upon the difficulties they encountered caring for him at home, even with the assistance of hospice, my recommendation would be for consideration for transition to [facility] for end-of-life care.  Time: 1045-1130, 1150-1210  Total time: 75 minutes  Greater than 50%  of this time was spent counseling and coordinating care related to the above assessment and plan.  Micheline Rough, MD [place] Team [phone]

## 2020-06-07 NOTE — Progress Notes (Signed)
TOC CM received call from wife, [name], made her aware he will be moved to 1223. Updated ED RN. [place], [place] ED TOC CM [phone]

## 2020-06-07 NOTE — Progress Notes (Signed)
Hydrologist [facility]) Hospital Liaison: RN note     This is a new hospice patient admitted to [facility] services yesterday, 06/06/2020.    He received an on call RN visit yesterday evening where it was noted his sats were 83-87% on 15L via mask. Hospice RN administered Roxanol dose of 1.63ml per medication order and provided education to family on need for medications for reduced anxiety and increased comfort.   This morning the family notified [facility] they had called EMS when patient developed fever 103F. EMS advised he return to the hospital per triage notes.   [facility] hospital liaison will continue to follow patient while in ED and if admitted. Please call with any hospice related questions.   If ambulance transport is needed please use [facility] as they contract this service for all of our active hospice patients.   Farrel Gordon, RN, [facility] (listed on AMION under [place])   [phone]

## 2020-06-07 NOTE — ED Provider Notes (Addendum)
[facility] DEPT Provider Note   CSN: [id number] Arrival date & time: 06/04/2020  0913     History Difficulty breathing  Peter Wood is a 85 y.o. male.  HPI   Pt was just discharged from the hospital yesterday after being admitted to the hospital on 9/14.  Pt was discharged with a diagnosis of acute hypoxic respiratory failure .  Pt has history of metastatic gastric ca.  Question of diffuse pna later felt to be possible lymphangitic spread.  Palliative care was consulted.  Pt was discharged yesterday with plans for home hospice.  Per EMS report pt was brought in for a decline in his breathing status.  Pt had been getting morphine.  When EMS arrived he was not on oxygen.  He was noted to have a temp of 103.  Pt was placed on supplemental oxygen.  Case discussed with [facility]. Family apparently was very anxious about the patient since discharge. They had nine phone calls overnight since being released from the hospital. This morning the patient reportedly was having difficulty with his breathing but the family were very anxious and felt like he needed to go back to the hospital. While the patient was in the hospital there was debate about home hospice versus inpatient hospice care  Patient himself denies any complaints. He states he feels great  Past Medical History:  Diagnosis Date  . Arthritis   . Chronic diastolic CHF (congestive heart failure) ([place]) 05/27/2020  . Diverticulosis of colon   . Family history of breast cancer   . Family history of prostate cancer   . HTN (hypertension)   . Hx of colonic polyp   . Pneumonia    as a child/baby  . Poor circulation of extremity    right leg  . Prostate cancer ([place]) 2015   prostate cancer - radiation treated  . Pulmonary embolism ([place])    on eliquis     Patient Active Problem List   Diagnosis Date Noted  . Advanced care planning/counseling discussion   . Palliative care by specialist    . Acute respiratory failure with hypoxia ([place]) 05/27/2020  . Chronic diastolic CHF (congestive heart failure) ([place]) 05/27/2020  . Multifocal pneumonia 05/26/2020  . Hypoalbuminemia 05/26/2020  . Abnormal LFTs 05/26/2020  . Iron deficiency anemia due to chronic blood loss 12/26/2019  . Port-A-Cath in place 12/18/2018  . Anemia 11/12/2018  . Anemia due to antineoplastic chemotherapy 11/06/2018  . Genetic testing 10/14/2018  . Metastasis from malignant tumor of stomach ([place]) 10/04/2018  . Gastric cancer ([place]) 10/04/2018  . Family history of prostate cancer   . Family history of breast cancer   . Aortic atherosclerosis ([place]) 10/03/2018  . Acute pulmonary embolus ([place]) 09/20/2018  . Pulmonary embolism ([place]) 09/19/2018  . Liver metastases ([place]) 09/19/2018  . Abdominal neoplasm without bowel obstruction 09/19/2018  . Nausea 09/19/2018  . Abdominal pain 09/19/2018  . Upper respiratory infection 08/01/2018  . Varicose vein of leg 07/22/2018  . Weight loss 01/18/2018  . Claudication ([place]) 01/16/2017  . Macular hole, right 09/19/2016  . Macular hole, right eye 09/19/2016  . Right sciatic nerve pain 07/19/2016  . Finger mass, right 06/24/2015  . Prostate cancer ([place]) 09/16/2013  . Rash and nonspecific skin eruption 02/12/2013  . Goals of care, counseling/discussion 09/01/2011  . TOBACCO USE, QUIT 08/25/2009  . ERECTILE DYSFUNCTION 03/03/2009  . PSA, INCREASED 03/03/2009  . Essential hypertension 06/10/2007  . DIVERTICULOSIS, COLON 06/10/2007  . COLONIC  POLYPS, HX OF 06/10/2007    Past Surgical History:  Procedure Laterality Date  . [place] VITRECTOMY WITH 20 GAUGE MVR PORT FOR MACULAR HOLE Right 09/19/2016   Procedure: 25 GAUGE PARS PLANA VITRECTOMY WITH 20 GAUGE MVR PORT FOR MACULAR HOLE, MEMBRANE PEEL, SERUM PATCH, GAS FLUID EXCHANGE, HEADSCOPE LASER;  Surgeon: [name], MD;  Location: [facility];  Service: Ophthalmology;  Laterality: Right;  . BIOPSY  09/20/2018    Procedure: BIOPSY;  Surgeon: Gatha Mayer, MD;  Location: [facility] ENDOSCOPY;  Service: Endoscopy;;  . BRONCHIAL WASHINGS  05/26/2020   Procedure: BRONCHIAL WASHINGS;  Surgeon: Freddi Starr, MD;  Location: [facility] ENDOSCOPY;  Service: Pulmonary;;  . CATARACT EXTRACTION  08/27/12   Right  . COLONOSCOPY    . ESOPHAGOGASTRODUODENOSCOPY (EGD) WITH PROPOFOL N/A 09/20/2018   Procedure: ESOPHAGOGASTRODUODENOSCOPY (EGD) WITH PROPOFOL;  Surgeon: Gatha Mayer, MD;  Location: [facility];  Service: Endoscopy;  Laterality: N/A;  . EYE SURGERY Right 2018   right and left  . HAND SURGERY  2016   right thumb  . IR IMAGING GUIDED PORT INSERTION  11/04/2018  . KNEE SURGERY Left 1989  . VIDEO BRONCHOSCOPY N/A 05/26/2020   Procedure: VIDEO BRONCHOSCOPY WITHOUT FLUORO;  Surgeon: Freddi Starr, MD;  Location: [facility];  Service: Pulmonary;  Laterality: N/A;       Family History  Problem Relation Age of Onset  . Prostate cancer Father        dx >50  . Hypertension Mother   . Ulcers Mother   . Breast cancer Sister        dx 20's  . Prostate cancer Brother        dx under 57, metasttic, cause of his death  . Ulcers Brother   . Ulcers Sister     Social History   Tobacco Use  . Smoking status: Former Smoker    Years: 4.00    Quit date: 09/12/1967    Years since quitting: 52.7  . Smokeless tobacco: Never Used  Vaping Use  . Vaping Use: Never used  Substance Use Topics  . Alcohol use: No  . Drug use: No    Home Medications Prior to Admission medications   Medication Sig Start Date End Date Taking? Authorizing Provider  apixaban (ELIQUIS) 2.5 MG TABS tablet Take 1 tablet (2.5 mg total) by mouth 2 (two) times daily. 04/12/20   Plotnikov, Evie Lacks, MD  benzonatate (TESSALON) 100 MG capsule Take 1 capsule (100 mg total) by mouth 2 (two) times daily as needed for cough. 05/18/20   Gardenia Phlegm, NP  lactose free nutrition (BOOST PLUS) LIQD Take 237 mLs by mouth 3 (three) times daily  with meals. 06/06/20   Nita Sells, MD  latanoprost (XALATAN) 0.005 % ophthalmic solution Place 1 drop into both eyes at bedtime.  10/24/17   [provider]  methylPREDNISolone sodium succinate (SOLU-MEDROL) 125 mg/2 mL injection Inject 0.96 mLs (60 mg total) into the vein every 12 (twelve) hours. 06/06/20   Nita Sells, MD  metoprolol tartrate (LOPRESSOR) 25 MG tablet Take 0.5 tablets (12.5 mg total) by mouth 2 (two) times daily. 06/06/20   Nita Sells, MD  Misc Natural Products (AIRBORNE ELDERBERRY) CHEW Chew by mouth daily. X 2 Gummies    [provider]  morphine 10 MG/5ML solution Place 1.3 mLs (2.6 mg total) under the tongue every 2 (two) hours as needed for up to 3 days (shortness of breath). 06/05/20 06/08/20  Samtani,  Jai-Gurmukh, MD  predniSONE (DELTASONE) 20 MG tablet Take 3 tablets (60 mg total) by mouth daily with breakfast. 06/08/20   Nita Sells, MD  sodium chloride flush (NS) 0.9 % SOLN 10-40 mLs by Intracatheter route as needed (flush). 06/06/20   Nita Sells, MD    Allergies    Patient has no known allergies.  Review of Systems   Review of Systems  All other systems reviewed and are negative.   Physical Exam Updated Vital Signs BP (!) 135/102   Pulse (!) 103   Temp (!) 96.7 F (35.9 C) (Axillary)   Resp 16   Ht 1.626 m (5\' 4" )   Wt 61.2 kg   SpO2 94%   BMI 23.17 kg/m   Physical Exam Vitals and nursing note reviewed.  Constitutional:      Appearance: He is cachectic. He is ill-appearing.  HENT:     Head: Normocephalic and atraumatic.     Right Ear: External ear normal.     Left Ear: External ear normal.  Eyes:     General: No scleral icterus.       Right eye: No discharge.        Left eye: No discharge.     Conjunctiva/sclera: Conjunctivae normal.  Neck:     Trachea: No tracheal deviation.  Cardiovascular:     Rate and Rhythm: Normal rate and regular rhythm.  Pulmonary:     Effort: Respiratory  distress present.     Breath sounds: No stridor. No wheezing or rales.     Comments: Increased work of breathing  Abdominal:     General: Abdomen is flat. Bowel sounds are normal. There is no distension.     Palpations: Abdomen is soft.     Tenderness: There is no abdominal tenderness. There is no guarding or rebound.  Musculoskeletal:        General: No tenderness.     Cervical back: Neck supple.  Skin:    General: Skin is warm and dry.     Findings: No rash.  Neurological:     Mental Status: He is alert.     Cranial Nerves: No cranial nerve deficit (no facial droop, extraocular movements intact, no slurred speech).     Sensory: No sensory deficit.     Motor: Weakness (generalized) present. No abnormal muscle tone or seizure activity.     Coordination: Coordination normal.     ED Results / Procedures / Treatments   Labs (all labs ordered are listed, but only abnormal results are displayed) Labs Reviewed  LACTIC ACID, PLASMA - Abnormal; Notable for the following components:      Result Value   Lactic Acid, Venous 2.3 (*)    All other components within normal limits  COMPREHENSIVE METABOLIC PANEL - Abnormal; Notable for the following components:   Sodium 149 (*)    Glucose, Bld 122 (*)    BUN 32 (*)    Creatinine, Ser 0.55 (*)    Total Protein 5.7 (*)    Albumin 2.4 (*)    AST 78 (*)    ALT 163 (*)    Alkaline Phosphatase 489 (*)    All other components within normal limits  CBC WITH DIFFERENTIAL/PLATELET - Abnormal; Notable for the following components:   WBC 26.4 (*)    RBC 2.92 (*)    Hemoglobin 9.9 (*)    HCT 31.4 (*)    MCV 107.5 (*)    RDW 17.2 (*)    nRBC 0.3 (*)  Neutro Abs 24.1 (*)    Abs Immature Granulocytes 0.28 (*)    All other components within normal limits  PROTIME-INR - Abnormal; Notable for the following components:   Prothrombin Time 17.8 (*)    INR 1.5 (*)    All other components within normal limits  RESPIRATORY PANEL BY RT PCR (FLU A&B,  COVID)  CULTURE, BLOOD (SINGLE)  URINE CULTURE  CULTURE, BLOOD (SINGLE)  APTT  LACTIC ACID, PLASMA  URINALYSIS, ROUTINE W REFLEX MICROSCOPIC    EKG EKG Interpretation  Date/Time:  Monday June 07 2020 09:34:52 EDT Ventricular Rate:  108 PR Interval:    QRS Duration: 69 QT Interval:  301 QTC Calculation: 404 R Axis:   -11 Text Interpretation: Sinus tachycardia Multiple premature complexes, vent & supraven Inferior infarct, old No significant change since last tracing Confirmed by Dorie Rank [phone]) on 05/16/2020 9:51:40 AM   Radiology DG Chest Port 1 View  Result Date: 05/22/2020 CLINICAL DATA:  Dyspnea, CHF, hypertension EXAM: PORTABLE CHEST 1 VIEW COMPARISON:  Portable exam 0934 hours compared to 06/01/2020 FINDINGS: RIGHT jugular Port-A-Cath unchanged tip projecting over SVC. Normal heart size, mediastinal contours, and pulmonary vascularity. Hazy BILATERAL airspace infiltrates, slightly improved in lingula. Volume loss and loculated effusion at upper RIGHT chest unchanged. No pneumothorax or acute osseous findings. IMPRESSION: Persistent loculated effusion in volume loss in the upper RIGHT chest. Hazy BILATERAL airspace infiltrates question multifocal pneumonia, slightly improved on LEFT. Electronically Signed   By: Lavonia Dana M.D.   On: 05/29/2020 09:49    Procedures .Critical Care Performed by: Dorie Rank, MD Authorized by: Dorie Rank, MD   Critical care provider statement:    Critical care time (minutes):  45   Critical care was time spent personally by me on the following activities:  Discussions with consultants, evaluation of patient's response to treatment, examination of patient, ordering and performing treatments and interventions, ordering and review of laboratory studies, ordering and review of radiographic studies, pulse oximetry, re-evaluation of patient's condition, obtaining history from patient or surrogate and review of old charts   (including critical  care time)  Medications Ordered in ED Medications  cefTRIAXone (ROCEPHIN) 2 g in sodium chloride 0.9 % 100 mL IVPB (has no administration in time range)  azithromycin (ZITHROMAX) 500 mg in sodium chloride 0.9 % 250 mL IVPB (has no administration in time range)  lactated ringers infusion (has no administration in time range)  lactated ringers bolus 1,000 mL (has no administration in time range)    And  lactated ringers bolus 1,000 mL (has no administration in time range)  lactated ringers bolus 500 mL (500 mLs Intravenous New Bag/Given 06/10/2020 2458)    ED Course  I have reviewed the triage vital signs and the nursing notes.  Pertinent labs & imaging results that were available during my care of the patient were reviewed by me and considered in my medical decision making (see chart for details).  Clinical Course as of Jun 07 1209  Mon Jun 07, 2020  0950 Prior records reviewed.  Pt normotensive and no fever.  Pt is on palliative hospice care.  Will treat for possible infection now but will contact palliative care.  Consider transition to full palliative measures.   [KD]  9833 [id number] with palliative care.  Will come and evaluate patient.  Requests hospitalist admission.   [BH]  4193 Discussed with wife.  Ultimately she would like him to go home again.  They came in this morning because they could not  get him to keep his mask on.  Pt is on a non rebreather to keep his sats in the 90s.  Not sure if that amount of o2 is feasible at home.  Wife does agree with temporary admission if needed to sort this out.   [JK]  1116 White blood cell count shows increasing leukocytosis.  Patient also is hypernatremic.   [HK]  3276 CXR shows persistent infiltrates   [JK]  1158 Pt seen by Dr Domingo Cocking in the ED.  Appreciate his help.  Long discussion with family.  They would like to try abx for 24 hours to see if he improves.  Ultimately may be changed to full comfort care if not improving.  Pt remains dnr,  dni   [JK]  74 DIscussed with Dr Marylyn Ishihara   [JK]    Clinical Course User Index [JK] Dorie Rank, MD   MDM Rules/Calculators/A&P                         Patient presented to ED for evaluation of respiratory difficulty.  Patient has complex medical problems including metastatic CA and recent admission for hypoxic respiratory failure felt to be related to either pneumonia or more likely lymphangitic spread of his malignancy.  Patient just left the hospital yesterday with hospice palliative care.  Family wanted to go home but were unable to manage him last evening.  Patient had worsening respiratory difficulty and was notably hypoxic.  Patient improved with oxygen supplementation.  Laboratory tests do show worsening leukocytosis compared to his recent admission.  Chest x-ray shows persistent infiltrates.  It is possible he has a component of pneumonia on top of his malignancy.  Palliative care has been involved in his care.  Patient was seen in the ED by Dr. Domingo Cocking.  Family requests inpatient treatment.  May ultimately transition to full comfort care if he does not have any notable improvement in the next couple of days.  Final Clinical Impression(s) / ED Diagnoses Final diagnoses:  Community acquired pneumonia, unspecified laterality  Metastatic malignant neoplasm, unspecified site [facility])      Dorie Rank, MD 05/25/2020 1204    Dorie Rank, MD 05/27/2020 1210

## 2020-06-07 NOTE — H&P (Addendum)
History and Physical    Peter Wood LGX:[id number] DOB: [birth year] DOA: 06/10/2020  PCP: Cassandria Anger, MD  Patient coming from: Home  Chief Complaint: Dyspnea  HPI: Peter Wood is a 85 y.o. male with medical history significant of metastatic gastric cancer. Presenting with increasing dyspnea. Discharged yesterday to home with home hospice. History from family. Family noted that he was having increasing problems with breathing. They made multiple calls to the hospice company but were unable to improve the patient's situation. They became concerned and had him transported back to the ED.    ED Course: EDP consulted Palliative Care. They discussed a treatment plan to include admission to hospital for short course of antibiotics. If that did not improve his breathing status, they would discuss full comfort care. Family agreed. [facility] was called for admission.   Review of Systems:  Unable to obtain d/t mentation.    Past Medical History:  Diagnosis Date  . Arthritis   . Chronic diastolic CHF (congestive heart failure) ([place]) 05/27/2020  . Diverticulosis of colon   . Family history of breast cancer   . Family history of prostate cancer   . HTN (hypertension)   . Hx of colonic polyp   . Pneumonia    as a child/baby  . Poor circulation of extremity    right leg  . Prostate cancer ([place]) 2015   prostate cancer - radiation treated  . Pulmonary embolism ([place])    on eliquis     Past Surgical History:  Procedure Laterality Date  . [place] VITRECTOMY WITH 20 GAUGE MVR PORT FOR MACULAR HOLE Right 09/19/2016   Procedure: 25 GAUGE PARS PLANA VITRECTOMY WITH 20 GAUGE MVR PORT FOR MACULAR HOLE, MEMBRANE PEEL, SERUM PATCH, GAS FLUID EXCHANGE, HEADSCOPE LASER;  Surgeon: [name], MD;  Location: [place];  Service: Ophthalmology;  Laterality: Right;  . BIOPSY  09/20/2018   Procedure: BIOPSY;  Surgeon: Gatha Mayer, MD;  Location: [facility] ENDOSCOPY;  Service: Endoscopy;;  .  BRONCHIAL WASHINGS  05/26/2020   Procedure: BRONCHIAL WASHINGS;  Surgeon: Freddi Starr, MD;  Location: [facility] ENDOSCOPY;  Service: Pulmonary;;  . CATARACT EXTRACTION  08/27/12   Right  . COLONOSCOPY    . ESOPHAGOGASTRODUODENOSCOPY (EGD) WITH PROPOFOL N/A 09/20/2018   Procedure: ESOPHAGOGASTRODUODENOSCOPY (EGD) WITH PROPOFOL;  Surgeon: Gatha Mayer, MD;  Location: [place];  Service: Endoscopy;  Laterality: N/A;  . EYE SURGERY Right 2018   right and left  . HAND SURGERY  2016   right thumb  . IR IMAGING GUIDED PORT INSERTION  11/04/2018  . KNEE SURGERY Left 1989  . VIDEO BRONCHOSCOPY N/A 05/26/2020   Procedure: VIDEO BRONCHOSCOPY WITHOUT FLUORO;  Surgeon: Freddi Starr, MD;  Location: [facility];  Service: Pulmonary;  Laterality: N/A;     reports that he quit smoking about 52 years ago. He quit after 4.00 years of use. He has never used smokeless tobacco. He reports that he does not drink alcohol and does not use drugs.  No Known Allergies  Family History  Problem Relation Age of Onset  . Prostate cancer Father        dx >50  . Hypertension Mother   . Ulcers Mother   . Breast cancer Sister        dx 20's  . Prostate cancer Brother        dx under 61, metasttic, cause of his death  . Ulcers Brother   . Ulcers Sister  Prior to Admission medications   Medication Sig Start Date End Date Taking? Authorizing Provider  apixaban (ELIQUIS) 2.5 MG TABS tablet Take 1 tablet (2.5 mg total) by mouth 2 (two) times daily. 04/12/20   Plotnikov, Evie Lacks, MD  benzonatate (TESSALON) 100 MG capsule Take 1 capsule (100 mg total) by mouth 2 (two) times daily as needed for cough. 05/18/20   Gardenia Phlegm, NP  lactose free nutrition (BOOST PLUS) LIQD Take 237 mLs by mouth 3 (three) times daily with meals. 06/06/20   Nita Sells, MD  latanoprost (XALATAN) 0.005 % ophthalmic solution Place 1 drop into both eyes at bedtime.  10/24/17   [provider]   methylPREDNISolone sodium succinate (SOLU-MEDROL) 125 mg/2 mL injection Inject 0.96 mLs (60 mg total) into the vein every 12 (twelve) hours. 06/06/20   Nita Sells, MD  metoprolol tartrate (LOPRESSOR) 25 MG tablet Take 0.5 tablets (12.5 mg total) by mouth 2 (two) times daily. 06/06/20   Nita Sells, MD  Misc Natural Products (AIRBORNE ELDERBERRY) CHEW Chew by mouth daily. X 2 Gummies    [provider]  morphine 10 MG/5ML solution Place 1.3 mLs (2.6 mg total) under the tongue every 2 (two) hours as needed for up to 3 days (shortness of breath). 06/05/20 06/08/20  Nita Sells, MD  predniSONE (DELTASONE) 20 MG tablet Take 3 tablets (60 mg total) by mouth daily with breakfast. 06/08/20   Nita Sells, MD  sodium chloride flush (NS) 0.9 % SOLN 10-40 mLs by Intracatheter route as needed (flush). 06/06/20   Nita Sells, MD    Physical Exam: Vitals:   05/14/2020 0951 06/10/2020 1100 05/30/2020 1130 06/04/2020 1300  BP:  (!) 141/105 (!) 135/102 135/82  Pulse:  (!) 103 (!) 103 (!) 105  Resp:  15 16 11   Temp:      TempSrc:      SpO2:  97% 94% 97%  Weight: 61.2 kg     Height: 5\' 4"  (1.626 m)       General: 85 y.o. ill appearing male resting in bed, on NRB, cachetic Eyes: PERRL, normal sclera Neck: thin, trachea midline Cardiovascular: tachy regular, +S1, S2, no m/g/r, equal pulses throughout Respiratory: decreased at bases, course, increased WOB, on NRB GI: BS+, NDNT, no organomegaly noted MSK: No e/c/c Skin: No rashes, bruises, ulcerations noted Neuro: Alert to name only, no focality on exam but limited interaction Psyc: Appropriate interaction and affect, calm/cooperative  Labs on Admission: I have personally reviewed following labs and imaging studies  CBC: Recent Labs  Lab 06/03/20 0500 05/30/2020 1002  WBC 16.7* 26.4*  NEUTROABS  --  24.1*  HGB 9.2* 9.9*  HCT 29.1* 31.4*  MCV 107.4* 107.5*  PLT 261 563   Basic Metabolic Panel: Recent  Labs  Lab 06/01/20 0333 06/02/20 0513 05/13/2020 1002  NA 147* 143 149*  K 4.2 4.1 4.2  CL 103 102 106  CO2 32 31 31  GLUCOSE 133* 124* 122*  BUN 36* 37* 32*  CREATININE 0.68 0.54* 0.55*  CALCIUM 9.2 9.1 9.4   GFR: Estimated Creatinine Clearance: 59.6 mL/min (A) (by C-G formula based on SCr of 0.55 mg/dL (L)). Liver Function Tests: Recent Labs  Lab 05/23/2020 1002  AST 78*  ALT 163*  ALKPHOS 489*  BILITOT 1.0  PROT 5.7*  ALBUMIN 2.4*   No results for input(s): LIPASE, AMYLASE in the last 168 hours. No results for input(s): AMMONIA in the last 168 hours. Coagulation Profile: Recent Labs  Lab 05/23/2020 1002  INR 1.5*   Cardiac Enzymes: No results for input(s): CKTOTAL, CKMB, CKMBINDEX, TROPONINI in the last 168 hours. BNP (last 3 results) No results for input(s): PROBNP in the last 8760 hours. HbA1C: No results for input(s): HGBA1C in the last 72 hours. CBG: No results for input(s): GLUCAP in the last 168 hours. Lipid Profile: No results for input(s): CHOL, HDL, LDLCALC, TRIG, CHOLHDL, LDLDIRECT in the last 72 hours. Thyroid Function Tests: No results for input(s): TSH, T4TOTAL, FREET4, T3FREE, THYROIDAB in the last 72 hours. Anemia Panel: No results for input(s): VITAMINB12, FOLATE, FERRITIN, TIBC, IRON, RETICCTPCT in the last 72 hours. Urine analysis:    Component Value Date/Time   COLORURINE YELLOW 02/18/2019 1056   APPEARANCEUR CLEAR 02/18/2019 1056   LABSPEC 1.018 02/18/2019 1056   PHURINE 6.0 02/18/2019 1056   GLUCOSEU NEGATIVE 02/18/2019 1056   GLUCOSEU NEGATIVE 07/20/2017 1056   HGBUR NEGATIVE 02/18/2019 1056   [place] 02/18/2019 1056   [place] 02/18/2019 1056   PROTEINUR 30 (A) 02/18/2019 1056   UROBILINOGEN 0.2 07/20/2017 1056   NITRITE NEGATIVE 02/18/2019 1056   [place] 02/18/2019 1056    Radiological Exams on Admission: DG Chest Port 1 View  Result Date: 05/31/2020 CLINICAL DATA:  Dyspnea, CHF,  hypertension EXAM: PORTABLE CHEST 1 VIEW COMPARISON:  Portable exam 0934 hours compared to 06/01/2020 FINDINGS: RIGHT jugular Port-A-Cath unchanged tip projecting over SVC. Normal heart size, mediastinal contours, and pulmonary vascularity. Hazy BILATERAL airspace infiltrates, slightly improved in lingula. Volume loss and loculated effusion at upper RIGHT chest unchanged. No pneumothorax or acute osseous findings. IMPRESSION: Persistent loculated effusion in volume loss in the upper RIGHT chest. Hazy BILATERAL airspace infiltrates question multifocal pneumonia, slightly improved on LEFT. Electronically Signed   By: Lavonia Dana M.D.   On: 05/21/2020 09:49    Assessment/Plan Multifocal PNA Persistent loculated RU pleural effusion Acute respiratory failure Sepsis     - admit to inpt; stepdown     - initiate sepsis protocol     - see palliative care note for discussion with family about prognosis, expectations     - appreciate palliative care assistance; will likely need inpt hospice; he remains DNR  Gastric CA w/ mets Palliative care/hospice patient     - no curative option at this point     - appreciate palliative care assistance  Chronic diastolic HF     - hold diuretics for now; monitor fluid status (daily wts, I&O)  Hypernatremia     - receiving fluids, monitor  Macrocytic anemia     - at baseline, no evidence of bleed, monitor  Severe protein calorie malnutrition     - secondary to chronic disease     - encourage diet  Elevated LFTs     - secondary to mets; follow  DVT prophylaxis: eliquis  Code Status: DNR  Family Communication: Spoke with spouse by phone  Consults called: EDP called palliative care  Admission status: Inpatient d/t severity of disease.   Status is: Inpatient  Remains inpatient appropriate because:Inpatient level of care appropriate due to severity of illness   Dispo: The patient is from: Home              Anticipated d/c is to: Inpatient hospice.               Anticipated d/c date is: 3 days              Patient currently is not medically stable to d/c.  Peter Wood [place]  DO [facility]  If 7PM-7AM, please contact night-coverage [email or web address]  05/13/2020, 1:28 PM

## 2020-06-07 NOTE — ED Notes (Signed)
Pt's IV is located in [facility] and patient is having difficulty keeping arm straight.  Pt and family educated on importance.  Will start antibiotic when bolus is completed.

## 2020-06-07 NOTE — Progress Notes (Signed)
Pharmacy Antibiotic Note  Peter Wood is a 85 y.o. male admitted on 06/05/2020 with pneumonia.  Pharmacy has been consulted for vancomycin + cefepime dosing.  Today, 06/03/2020  WBC 26.4  Lactate 2  SCr WNL, CrCl ~59 mL/min  Afebrile  Plan:  Cefepime 2 g IV q12h  Vancomycin 1250 mg LD followed by 1000 mg IV q24h  Goal VT 10-20 mcg/mL, AUC 400-550  Follow renal function and culture data.  MRSA PCR ordered  Height: 5\' 4"  (162.6 cm) Weight: 61.2 kg (134 lb 15.8 oz) IBW/kg (Calculated) : 59.2  Temp (24hrs), Avg:96.7 F (35.9 C), Min:96.7 F (35.9 C), Max:96.7 F (35.9 C)  Recent Labs  Lab 06/01/20 0333 06/02/20 0513 06/03/20 0500 06/01/2020 0931 06/05/2020 1002 05/28/2020 1231  WBC  --   --  16.7*  --  26.4*  --   CREATININE 0.68 0.54*  --   --  0.55*  --   LATICACIDVEN  --   --   --  2.3*  --  2.0*    Estimated Creatinine Clearance: 59.6 mL/min (A) (by C-G formula based on SCr of 0.55 mg/dL (L)).    No Known Allergies  Antimicrobials this admission: cefepime 9/27 >>  vancomycin 9/27 >>  CTX/azithromycin x1 in ED 9/27  Dose adjustments this admission:  Microbiology results: 9/27 BCx:  9/27 MRSA PCR:   Thank you for allowing pharmacy to be a part of this patient's care.  Lenis Noon, PharmD 06/10/2020 2:51 PM

## 2020-06-07 NOTE — ED Notes (Signed)
Pt pulled non rebreather off, pt RA SPO2 74%, pt redirected and placed back on non rebreather pt SPO2 92%, pt denies pain at this time.

## 2020-06-07 NOTE — Progress Notes (Signed)
[facility]) hospitalized hospice patient visit.  Peter Wood is a newly admitted home hospice patient with a terminal diagnosis of gastric cancer.  Family activated EMS when patient developed fever 103F and decreased SpO2 level. Family contacted hospice after calling EMS. Patient was admitted to [facility] on 05/27/2020 with a diagnosis of Sepsis.  Per Dr. Tomasa Hosteller with [facility] hospice this is a related hospital admission.   Visited at bedside in ED along with Dr. Domingo Cocking of the palliative medical team. Patient is awake and alert, on 15L non rebreather mask. Wife was at bedside, daughter and grandson were on speaker phone. Dr. Domingo Cocking discussed current diagnosis and explained that any pneumonia would likely return once antibiotics are completed due the cancer metastasis to lungs.  We offered a bed at [facility] but family declined. Family wants patient admitted and to treat pneumonia. If he gets worse or there is no improvement in 24 to 48 hours, recommendation is to transition to full comfort care.    VS: 96.7 aux, 135/96, 104, 100% on 15L NR mask  Abnormal labs: Results for Wood, Peter (MRN [id number]) as of 05/26/2020 14:58 Sodium: 149 (H) Glucose: 122 (H) BUN: 32 (H) Creatinine: 0.55 (L) Alkaline Phosphatase: 489 (H) Albumin: 2.4 (L) AST: 78 (H) ALT: 163 (H) Total Protein: 5.7 (L)  Lactic Acid, Venous: 2.0 (HH)  WBC: 26.4 (H) RBC: 2.92 (L) Hemoglobin: 9.9 (L) HCT: 31.4 (L) MCV: 107.5 (H) RDW: 17.2 (H) Platelets: 249 nRBC: 0.3 (H)  CXR: IMPRESSION: Persistent loculated effusion in volume loss in the upper RIGHT chest. Hazy BILATERAL airspace infiltrates question multifocal pneumonia, slightly improved on LEFT.  Medications: Maxipime 2g IVPB BID, Vancomycin 1250mg  IVPB QD  Problem list: Multifocal PNA Persistent  RU pleural effusion Acute respiratory failure Sepsis     - admit to inpt; stepdown    - initiate sepsis protocol Gastric CA w/  mets Chronic diastolic HF  Discharge planning: Ongoing.  Currently family wants to take him home with hospice and caregivers. Palliative care team strongly encouraged [place].  Family: as noted above, spoke with wife at bedside and other family members by phone IDG: Updated  GOC: patient is a DNR but family continues to want to treat the treatable and not transition to full comfort care.  Should patient need ambulance transport please use [facility] as they contract this service for all of our active hospice patients.  Please call with any hospice related questions.  Farrel Gordon, RN, [facility] (listed on AMION under [facility])   [phone]

## 2020-06-08 DIAGNOSIS — E870 Hyperosmolality and hypernatremia: Secondary | ICD-10-CM

## 2020-06-08 DIAGNOSIS — R0602 Shortness of breath: Secondary | ICD-10-CM

## 2020-06-08 DIAGNOSIS — I5032 Chronic diastolic (congestive) heart failure: Secondary | ICD-10-CM

## 2020-06-08 DIAGNOSIS — D539 Nutritional anemia, unspecified: Secondary | ICD-10-CM

## 2020-06-08 DIAGNOSIS — Z86711 Personal history of pulmonary embolism: Secondary | ICD-10-CM

## 2020-06-08 DIAGNOSIS — E43 Unspecified severe protein-calorie malnutrition: Secondary | ICD-10-CM

## 2020-06-08 DIAGNOSIS — C7800 Secondary malignant neoplasm of unspecified lung: Secondary | ICD-10-CM

## 2020-06-08 DIAGNOSIS — C169 Malignant neoplasm of stomach, unspecified: Secondary | ICD-10-CM

## 2020-06-08 DIAGNOSIS — Z789 Other specified health status: Secondary | ICD-10-CM

## 2020-06-08 DIAGNOSIS — Z9221 Personal history of antineoplastic chemotherapy: Secondary | ICD-10-CM

## 2020-06-08 LAB — CBC
HCT: 28.5 % — ABNORMAL LOW (ref 39.0–52.0)
HCT: 28.3 % — ABNORMAL LOW (ref 39.0–52.0)
Hemoglobin: 9.1 g/dL — ABNORMAL LOW (ref 13.0–17.0)
Hemoglobin: 8.8 g/dL — ABNORMAL LOW (ref 13.0–17.0)
MCH: 34.3 pg — ABNORMAL HIGH (ref 26.0–34.0)
MCH: 33.7 pg (ref 26.0–34.0)
MCHC: 31.9 g/dL (ref 30.0–36.0)
MCHC: 31.1 g/dL (ref 30.0–36.0)
MCV: 107.5 fL — ABNORMAL HIGH (ref 80.0–100.0)
MCV: 108.4 fL — ABNORMAL HIGH (ref 80.0–100.0)
Platelets: 205 10*3/uL (ref 150–400)
Platelets: 198 10*3/uL (ref 150–400)
RBC: 2.65 MIL/uL — ABNORMAL LOW (ref 4.22–5.81)
RBC: 2.61 MIL/uL — ABNORMAL LOW (ref 4.22–5.81)
RDW: 17.5 % — ABNORMAL HIGH (ref 11.5–15.5)
RDW: 17.4 % — ABNORMAL HIGH (ref 11.5–15.5)
WBC: 24.7 10*3/uL — ABNORMAL HIGH (ref 4.0–10.5)
WBC: 23.8 10*3/uL — ABNORMAL HIGH (ref 4.0–10.5)
nRBC: 0.1 % (ref 0.0–0.2)
nRBC: 0.2 % (ref 0.0–0.2)

## 2020-06-08 LAB — COMPREHENSIVE METABOLIC PANEL
ALT: 115 U/L — ABNORMAL HIGH (ref 0–44)
AST: 54 U/L — ABNORMAL HIGH (ref 15–41)
Albumin: 2.2 g/dL — ABNORMAL LOW (ref 3.5–5.0)
Alkaline Phosphatase: 429 U/L — ABNORMAL HIGH (ref 38–126)
Anion gap: 9 (ref 5–15)
BUN: 25 mg/dL — ABNORMAL HIGH (ref 8–23)
CO2: 32 mmol/L (ref 22–32)
Calcium: 8.8 mg/dL — ABNORMAL LOW (ref 8.9–10.3)
Chloride: 108 mmol/L (ref 98–111)
Creatinine, Ser: 0.48 mg/dL — ABNORMAL LOW (ref 0.61–1.24)
GFR calc Af Amer: >60 mL/min (ref >60)
GFR calc non Af Amer: >60 mL/min (ref >60)
Glucose, Bld: 109 mg/dL — ABNORMAL HIGH (ref 70–99)
Potassium: 3.5 mmol/L (ref 3.5–5.1)
Sodium: 149 mmol/L — ABNORMAL HIGH (ref 135–145)
Total Bilirubin: 1 mg/dL (ref 0.3–1.2)
Total Protein: 5.3 g/dL — ABNORMAL LOW (ref 6.5–8.1)

## 2020-06-08 LAB — CORTISOL-AM, BLOOD: Cortisol - AM: 36.8 ug/dL — ABNORMAL HIGH (ref 6.7–22.6)

## 2020-06-08 LAB — URINALYSIS, ROUTINE W REFLEX MICROSCOPIC
Bacteria, UA: NONE SEEN
Bilirubin Urine: NEGATIVE
Glucose, UA: NEGATIVE mg/dL
Ketones, ur: NEGATIVE mg/dL
Leukocytes,Ua: NEGATIVE
Nitrite: NEGATIVE
Protein, ur: 30 mg/dL — AB
Specific Gravity, Urine: 1.02 (ref 1.005–1.030)
pH: 6 (ref 5.0–8.0)

## 2020-06-08 LAB — PROTIME-INR
INR: 1.5 — ABNORMAL HIGH (ref 0.8–1.2)
Prothrombin Time: 17.8 seconds — ABNORMAL HIGH (ref 11.4–15.2)

## 2020-06-08 LAB — URINE CULTURE: Culture: NO GROWTH

## 2020-06-08 LAB — PROCALCITONIN: Procalcitonin: 3.64 ng/mL

## 2020-06-08 LAB — CREATININE, SERUM
Creatinine, Ser: 0.5 mg/dL — ABNORMAL LOW (ref 0.61–1.24)
GFR calc Af Amer: >60 mL/min (ref >60)
GFR calc non Af Amer: >60 mL/min (ref >60)

## 2020-06-08 LAB — MRSA PCR SCREENING: MRSA by PCR: NEGATIVE

## 2020-06-08 MED ORDER — ORAL CARE MOUTH RINSE
15.0000 mL | Freq: Two times a day (BID) | OROMUCOSAL | Status: DC
  Administered 2020-06-08 – 2020-06-10 (×3): 15 mL via OROMUCOSAL

## 2020-06-08 MED ORDER — SODIUM CHLORIDE 0.9 % IV SOLN: INTRAVENOUS | Status: DC | PRN

## 2020-06-08 MED ORDER — SODIUM CHLORIDE 0.9% FLUSH
10.0000 mL | INTRAVENOUS | Status: DC | PRN
  Administered 2020-06-09: 10 mL

## 2020-06-08 MED ORDER — CHLORHEXIDINE GLUCONATE CLOTH 2 % EX PADS
6.0000 | MEDICATED_PAD | Freq: Every day | CUTANEOUS | Status: DC
  Administered 2020-06-08 – 2020-06-09 (×2): 6 via TOPICAL

## 2020-06-08 NOTE — Assessment & Plan Note (Addendum)
-  Considered due to poor oral intake - mediocre intake since admit per family bedside; Na worse today - giving IVF will inevitably worsen respiratory status/comfort, therefore hold off on IVF/free water

## 2020-06-08 NOTE — Assessment & Plan Note (Addendum)
-  tachycardia, tachypnea, leukocytosis, lactic 2.3 on admission; source presumed pulmonary - given ongoing physical decline with poor prognosis, patient and family were considering transition to hospice however as of morning of 9/29, seems that everyone now wishes to remain "aggressive" - patient was continued on HFNC+NRB; remained DNR/I - solu-medrol started on 9/29 as well - by 9/30 patient continued to decline with wrosening renal function, hypernatremia and decreasing level of alertness; family met with PC and decision was made to transfer to comfort care in efforts of pursuing end of life care - abx, steroids, and non-essential meds were discontinued - patient too unstable for transfer out of hospital at this time

## 2020-06-08 NOTE — Progress Notes (Signed)
Peter Wood   DOB:1934-12-20   YT#:[id number]   TFT#:[id number]  Subjective:  Peter Wood in ED. He is focusing on needing a blanket (his banket has slipped a little down his chest), needing water and needing to use the toilet. He is not using the call button. Does not appear to recognize me   Objective: older African American man examined in bed Vitals:   06/08/20 0900 06/08/20 0905  BP: 115/82   Pulse: (!) 120 (!) 110  Resp: (!) 24 16  Temp:    SpO2: (!) 79% 90%    Body mass index is 23.17 kg/m.  Intake/Output Summary (Last 24 hours) at 06/08/2020 0910 Last data filed at 05/22/2020 2211 Gross per 24 hour  Intake 3200 ml  Output --  Net 3200 ml    More confused; did not test orientation formally   CBG (last 3)  No results for input(s): GLUCAP in the last 72 hours.   Labs:  Lab Results  Component Value Date   WBC 23.8 (H) 06/08/2020   HGB 8.8 (L) 06/08/2020   HCT 28.3 (L) 06/08/2020   MCV 108.4 (H) 06/08/2020   PLT 198 06/08/2020   NEUTROABS 24.1 (H) 05/22/2020    _0 @  Urine Studies No results for input(s): UHGB, CRYS in the last 72 hours.  Invalid input(s): UACOL, UAPR, USPG, UPH, UTP, UGL, UKET, UBIL, UNIT, UROB, ULEU, UEPI, UWBC, URBC, UBAC, CAST, [place], [place]  Basic Metabolic Panel: Recent Labs  Lab 06/02/20 0513 06/02/20 0513 06/01/2020 1002 06/09/2020 2307 06/08/20 0445  NA 143  --  149*  --  149*  K 4.1   < > 4.2  --  3.5  CL 102  --  106  --  108  CO2 31  --  31  --  32  GLUCOSE 124*  --  122*  --  109*  BUN 37*  --  32*  --  25*  CREATININE 0.54*  --  0.55* 0.50* 0.48*  CALCIUM 9.1  --  9.4  --  8.8*   < > = values in this interval not displayed.   GFR Estimated Creatinine Clearance: 59.6 mL/min (A) (by C-G formula based on SCr of 0.48 mg/dL (L)). Liver Function Tests: Recent Labs  Lab 05/28/2020 1002 06/08/20 0445  AST 78* 54*  ALT 163* 115*  ALKPHOS 489* 429*  BILITOT 1.0 1.0  PROT 5.7* 5.3*  ALBUMIN 2.4* 2.2*   No  results for input(s): LIPASE, AMYLASE in the last 168 hours. No results for input(s): AMMONIA in the last 168 hours. Coagulation profile Recent Labs  Lab 06/03/2020 1002 06/08/20 0445  INR 1.5* 1.5*    CBC: Recent Labs  Lab 06/03/20 0500 06/09/2020 1002 06/09/2020 2307 06/08/20 0445  WBC 16.7* 26.4* 24.7* 23.8*  NEUTROABS  --  24.1*  --   --   HGB 9.2* 9.9* 9.1* 8.8*  HCT 29.1* 31.4* 28.5* 28.3*  MCV 107.4* 107.5* 107.5* 108.4*  PLT 261 249 205 198   Cardiac Enzymes: No results for input(s): CKTOTAL, CKMB, CKMBINDEX, TROPONINI in the last 168 hours. BNP: Invalid input(s): POCBNP CBG: No results for input(s): GLUCAP in the last 168 hours. D-Dimer No results for input(s): DDIMER in the last 72 hours. Hgb A1c No results for input(s): HGBA1C in the last 72 hours. Lipid Profile No results for input(s): CHOL, HDL, LDLCALC, TRIG, CHOLHDL, LDLDIRECT in the last 72 hours. Thyroid function studies No results for input(s): TSH, T4TOTAL, T3FREE, THYROIDAB in the last 72 hours.  Invalid input(s): FREET3 Anemia work up No results for input(s): VITAMINB12, FOLATE, FERRITIN, TIBC, IRON, RETICCTPCT in the last 72 hours. Microbiology Recent Results (from the past 240 hour(s))  MRSA PCR Screening     Status: None   Collection Time: 05/30/20 11:18 AM   Specimen: Nasal Mucosa; Nasopharyngeal  Result Value Ref Range Status   MRSA by PCR NEGATIVE NEGATIVE Final    Comment:        The GeneXpert MRSA Assay (FDA approved for NASAL specimens only), is one component of a comprehensive MRSA colonization surveillance program. It is not intended to diagnose MRSA infection nor to guide or monitor treatment for MRSA infections. Performed at [facility], [place] [address], [place], [place] [address]   Respiratory Panel by RT PCR (Flu A&B, Covid) - Nasopharyngeal Swab     Status: None   Collection Time: 05/20/2020 10:02 AM   Specimen: Nasopharyngeal Swab  Result Value Ref Range  Status   SARS Coronavirus 2 by RT PCR NEGATIVE NEGATIVE Final    Comment: (NOTE) SARS-CoV-2 target nucleic acids are NOT DETECTED.  The SARS-CoV-2 RNA is generally detectable in upper respiratoy specimens during the acute phase of infection. The lowest concentration of SARS-CoV-2 viral copies this assay can detect is 131 copies/mL. A negative result does not preclude SARS-Cov-2 infection and should not be used as the sole basis for treatment or other patient management decisions. A negative result may occur with  improper specimen collection/handling, submission of specimen other than nasopharyngeal swab, presence of viral mutation(s) within the areas targeted by this assay, and inadequate number of viral copies (<131 copies/mL). A negative result must be combined with clinical observations, patient history, and epidemiological information. The expected result is Negative.  Fact Sheet for Patients:  PinkCheek.be  Fact Sheet for Healthcare Providers:  GravelBags.it  This test is no t yet approved or cleared by the [place] FDA and  has been authorized for detection and/or diagnosis of SARS-CoV-2 by FDA under an Emergency Use Authorization (EUA). This EUA will remain  in effect (meaning this test can be used) for the duration of the COVID-19 declaration under Section 564(b)(1) of the Act, 21 U.S.C. section 360bbb-3(b)(1), unless the authorization is terminated or revoked sooner.     Influenza A by PCR NEGATIVE NEGATIVE Final   Influenza B by PCR NEGATIVE NEGATIVE Final    Comment: (NOTE) The Xpert Xpress SARS-CoV-2/FLU/RSV assay is intended as an aid in  the diagnosis of influenza from Nasopharyngeal swab specimens and  should not be used as a sole basis for treatment. Nasal washings and  aspirates are unacceptable for Xpert Xpress SARS-CoV-2/FLU/RSV  testing.  Fact Sheet for  Patients: PinkCheek.be  Fact Sheet for Healthcare Providers: GravelBags.it  This test is not yet approved or cleared by the [place] FDA and  has been authorized for detection and/or diagnosis of SARS-CoV-2 by  FDA under an Emergency Use Authorization (EUA). This EUA will remain  in effect (meaning this test can be used) for the duration of the  Covid-19 declaration under Section 564(b)(1) of the Act, 21  U.S.C. section 360bbb-3(b)(1), unless the authorization is  terminated or revoked. Performed at [facility], [place] [address], [place], [place] [address]   Blood culture (routine single)     Status: None (Preliminary result)   Collection Time: 06/03/2020 10:06 AM   Specimen: BLOOD  Result Value Ref Range Status   Specimen Description   Final    BLOOD BLOOD RIGHT  FOREARM Performed at [facility], [place] [address] [place], [place] [address]    Special Requests   Final    BOTTLES DRAWN AEROBIC ONLY Blood Culture results may not be optimal due to an inadequate volume of blood received in culture bottles Performed at [place] [address] [place], [place] [address]    Culture   Final    NO GROWTH < 24 HOURS Performed at [place] [address] [place], [place] [address]    Report Status PENDING  Incomplete  Culture, blood (single)     Status: None (Preliminary result)   Collection Time: 05/21/2020 12:31 PM   Specimen: BLOOD  Result Value Ref Range Status   Specimen Description   Final    BLOOD BLOOD RIGHT HAND Performed at [place] [address] [place], [place] [address]    Special Requests   Final    BOTTLES DRAWN AEROBIC ONLY Blood Culture results may not be optimal due to an inadequate volume of blood received in culture bottles Performed at [place] [address] [place], [place]  [address]    Culture   Final    NO GROWTH < 12 HOURS Performed at [place] [address] [place], [place] [address]    Report Status PENDING  Incomplete      Studies:  DG Chest Port 1 View  Result Date: 05/30/2020 CLINICAL DATA:  Dyspnea, CHF, hypertension EXAM: PORTABLE CHEST 1 VIEW COMPARISON:  Portable exam 0934 hours compared to 06/01/2020 FINDINGS: RIGHT jugular Port-A-Cath unchanged tip projecting over SVC. Normal heart size, mediastinal contours, and pulmonary vascularity. Hazy BILATERAL airspace infiltrates, slightly improved in lingula. Volume loss and loculated effusion at upper RIGHT chest unchanged. No pneumothorax or acute osseous findings. IMPRESSION: Persistent loculated effusion in volume loss in the upper RIGHT chest. Hazy BILATERAL airspace infiltrates question multifocal pneumonia, slightly improved on LEFT. Electronically Signed   By: Lavonia Dana M.D.   On: 06/06/2020 09:49    Assessment: 85 y.o. [place], [place] man status post gastric biopsy 09/20/2018 showing adenocarcinoma, with a CA 19-9 greater than 65,000; staging studies showing an apparent mass adjacent to the head of the pancreas, innumerable liver lesions, upper abdominal mesenteric and peripancreatic lymphadenopathy, and an isolated sclerotic density in the left iliac bone             (a) genomics requested on gastric biopsy showed the tumor to be HER-2 amplified at 3+, PD-L1 positive, and T p53 mutated.  MSI is stable, mismatch repair status is proficient and the tumor mutational burden is intermediate.  (1) chest CT scan 09/19/2018 shows right lower lobe pulmonary embolism             (a) on intravenous heparin started 09/19/2018, transitioned to apixaban 10/01/2018  (2) genetics testing 10/04/2018: negative             (a) family history of prostate and early breast cancer; patient has a history of prostate cancer  (3) started gemcitabine/Abraxane 10/09/2018, repeated days 1, 8 and 15 of each  28-day cycle             (a) stopped after 1 cycle (3 doses) with reassessment of diagnosis based on CARIS results, noted above  (4) started oxaliplatin, capecitabine and trastuzumab 11/06/2018             (a) baseline echocardiogram 09/20/2018 shows an ejection fraction in the 55-60% range             (  b) oxaliplatin and trastuzumab every 21 days started 11/06/2018             (c) capecitabine dose 1000 mg twice daily for 14 days of every 21-day cycle             (d) CT of the abdomen and pelvis 02/11/2019 shows marked improvement in his liver lesions and resolution of periportal adenopathy             (e) tumor markers also show significant response,             (f) echocardiogram 02/12/2019 shows an ejection fraction in the 60-65% range             (g) CT abd/pelvis show stable/ improved disease, tumor markers nearly normal             (h) oxaliplatin stopped aftwer 04/30/2019 dose             (i) capecitabine stopped after September cycle              (5) trastuzumab maintenance started 05/20/2019, last dose 07/22/2019, with progression  (6) disease progression documented by lab work and CT scan 07/16/2019             (a) resumed FOLFOX 07/29/2019, continuing trastuzumab (every 14 days).             (b) restaging studies after 3 cycles showed evidence of progression (tumor markers equivocal)             (c) FOLFOX discontinued 08/26/2019  (7) started T-DM1 09/30/2019, repeat every 21 days             (a) pembrolizumab added 12/02/2019             (b) echocardiogram 08/25/2019 showed an ejection fraction in the 60-65% range             (c) repeat CT of the abdomen 12/11/2019 shows growth in 2 liver lesions  (8) started [facility] 12/23/2019 at 5.4 mg/kg.             (a) dose increased to 6.1 mg/kg with the third dose and two 6.4 (target dose) 03/17/2020                  (b) Changed to every 4 weeks after receiving 04/28/2020 dose (which was his most recent)  (9) iron deficiency  anemia:             (a) received Feraheme 04/22 /2021 and 01/08/2020      (10) pneumonia/ pneumonitis?  (a) patchy infiltrates on CXR 04/28/2020   (i) COVID-19 negative   (ii) received antibodies as outpatient   (iii) s/p Z=pack  (b) worsening cough, SOB and fever   (i) CT angio 05/25/2020 no PE, worsening bilateral infiltrates c/w atypical infection   (ii) vancomycin, unasyn started 05/25/2020; vanco d/c'd after one dose   (iii) methylprednisolone started 05/27/2020   (iv) cytology from bronchial fluid 05/26/2020 negative for malignancy  (11) transitioning to supportive/comfort care  (a) DNR in place  (b) discharged to home 9/26, called 911 day of discharge and following day, readmitted 05/12/2020  (c) likely [place] at discharge   Plan:  I discussed [place] situation with his wife [name] and his daughter Claiborne Billings. The family very much wanted to take care of him at home but they were overwhelmed and repeatedly called 911 despite also calling Hospice. I told them I thought Tristen is declining rapidly and my recommendation is  transfer to [facility]. They are agreeable.  I understand plan is for a brief trial of ABX and that is not unreasonable. On the other hand he had >7 days of ABX recent admission with no effect on his pulmonary function. This is because the most likely cause of his breathing problem at this time is lymphangitic spread from his cancer. This is a terminal development.  Accordingly my recommendatino is discharge to [facility] when hospitalist team/Hospice/family are comfortable with that plan.  Will follow with you.     Chauncey Cruel, MD 06/08/2020  9:10 AM Medical Oncology and Hematology [facility] [address] [place], [place] [address] Tel. [phone]    Fax. [phone]

## 2020-06-08 NOTE — Progress Notes (Signed)
[facility]) hospitalized hospice patient visit.    Peter Wood is a newly admitted home hospice patient with a terminal diagnosis of gastric cancer.  Family activated EMS when patient developed fever 103F and decreased SpO2 level. Family contacted hospice after calling EMS. Patient was admitted to [facility] on 06/02/2020 with a diagnosis of Sepsis.  Per Dr. Tomasa Hosteller with [facility] hospice this is a related hospital admission.    Visited at bedside just as patient arrived to hospital room. Patient is alert and oriented. He is on 15L [place]. No family at bedside.     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 Plan is for patient to transfer on 06/09/20  Family: spoke with wife by phone. IDG: Updated  GOC: DNR and comfort care.   Should patient need ambulance transport please use [facility] as they contract this service for all of our active hospice patients.   Please call with any hospice related questions.   Farrel Gordon, RN, [facility] (listed on AMION under [place])   [phone]

## 2020-06-08 NOTE — TOC Progression Note (Addendum)
Transition of Care [facility]) - Progression Note    Patient Details  Name: KINNETH FUJIWARA MRN: [id number] Date of Birth: [birth year]  Transition of Care [facility]) CM/SW Contact  Erenest Rasher, RN Phone Number: [phone] 06/08/2020, 4:57 PM  Clinical Narrative:     TOC CM received call from dtr, Hope Pigeon. States wife, [name] and family have decided on [place] at [place]. Notified [place] Sil[place], Admission with [facility] with new referral. States they will have a bed available in am, 06/09/2020. She has discussed with wife and wife will sign necessary paperwork. Notified Audrea Muscat, [facility] and they will have family sign paperwork to discontinue their services. Attending updated. Family understands that pt will transport tomorrow to [facility] if he is able to go safely by [facility]. Per Hospice Home, pt can be on up to 30 L of oxygen. [facility] can transport up to 15L of oxygen.   Expected Discharge Plan: [place] Barriers to Discharge: Hospice Bed not available (will be available on 06/09/2020)  Expected Discharge Plan and Services Expected Discharge Plan: [place] In-house Referral: Clinical Social Work, Hospice / Palliative Care Discharge Planning Services: CM Consult Post Acute Care Choice: Hospice Living arrangements for the past 2 months: Single Family Home Expected Discharge Date:  (unknown)                 DME Agency: [facility]ospice and [place] Determinants of Health (SDOH) Interventions    Readmission Risk Interventions No flowsheet data found.

## 2020-06-08 NOTE — Hospital Course (Addendum)
Peter Wood is an 85 yo male with PMH stage IV gastric adenocarcinoma who presented with SOB at home. He follows closely with oncology and has been declining rapidly recently. He has had outpatient antibiotics recently with minimal improvement and now presents with worsening SOB/dyspnea. There is concern for lymphangitic spread from his cancer.  Of note, he was just hospitalized 9/14 - 9/25 for pneumonia and hypoxic respiratory failure.  He was discharged home with intentions for pursuing hospice and comfort/end-of-life care.  Upon returning to the hospital again for this hospitalization, there was significant hesitation from the patient and family regarding the current goals of care.  He does remain a DNR/DNI however the morning following admission, he wished to hold off on transitioning to hospice and wanted to pursue antibiotics and ongoing medical care.  He was started on vancomycin and cefepime on admission.  Multiple discussions were held bedside with patient and family regarding that aggressive actions would likely be deemed futile and that the patient would progressively decline given his overall frail state.  Nevertheless, the patient did voice wishes to continue antibiotics and hold off on hospice on the morning of 06/09/2020.  He did however express understanding that if he did not improve, he knows he would decline and approach end of life.   On 06/10/2020 palliative care met with family and patient at bedside. He had continued to decline and showed no improvement with ongoing aggressive medical measures. After discussion decision was made for pursuing comfort level care and de-escalating aggressive measures in place. Patient passed naturally on 06/22/2020 at 0949.

## 2020-06-08 NOTE — Assessment & Plan Note (Signed)
-  Baseline hemoglobin 8 to 9 g/dL -Continue monitoring.  Remains at baseline

## 2020-06-08 NOTE — Progress Notes (Signed)
Daily Progress Note   Patient Name: Peter Wood       Date: 06/08/2020 DOB: [birth year]  Age: 85 y.o. MRN#: [id number] Attending Physician: Dwyane Dee, MD Primary Care Physician: Cassandria Anger, MD Admit Date: 05/23/2020  Reason for Consultation/Follow-up: Establishing goals of care and Terminal Care  Subjective: Patient wakes to voice but is confused. Thinks he is at home. Denies pain or shortness of breath. Asks for a heated blanket. Remains on NRB mask.   No family at bedside. Spoke with wife, Peter Wood via telephone explaining palliative f/u from her conversations with Dr. Micheline Rough. Peter Wood appreciates conversation with oncology. She shares plan for family meeting this afternoon to make decision about hospice. PMT contact information given and encouraged wife to call me this afternoon with questions or concerns.   ADDENDUM: Discussion with Peter Wood TOC CM/SW. She has spoken with wife and daughter at bedside this afternoon. Family is considering hospice home transfer. No [facility] beds. Waiting to hear back from [place] regarding [facility] bed.   Length of Stay: 1  Current Medications: Scheduled Meds:   apixaban  2.5 mg Oral BID   latanoprost  1 drop Both Eyes QHS    Continuous Infusions:  sodium chloride 75 mL/hr at 06/08/20 0046   ceFEPime (MAXIPIME) IV Stopped (06/01/2020 2211)   vancomycin      PRN Meds: acetaminophen **OR** acetaminophen, HYDROcodone-acetaminophen, ondansetron **OR** ondansetron (ZOFRAN) IV  Physical Exam Vitals and nursing note reviewed.  Constitutional:      Appearance: He is cachectic. He is ill-appearing.  HENT:     Head: Normocephalic and atraumatic.  Pulmonary:     Effort: No tachypnea, accessory muscle usage or  respiratory distress.     Comments: NRB Skin:    General: Skin is warm and dry.  Neurological:     Mental Status: He is easily aroused.     Comments: Awake, confused            Vital Signs: BP 131/88    Pulse 99    Temp 97.7 F (36.5 C) (Axillary)    Resp 15    Ht 5\' 4"  (1.626 m)    Wt 61.2 kg    SpO2 100%    BMI 23.17 kg/m  SpO2: SpO2: 100 % O2 Device: O2 Device: NRB O2 Flow Rate:  O2 Flow Rate (L/min): 15 L/min  Intake/output summary:   Intake/Output Summary (Last 24 hours) at 06/08/2020 1036 Last data filed at 06/10/2020 2211 Gross per 24 hour  Intake 3200 ml  Output --  Net 3200 ml   LBM:   Baseline Weight: Weight: 61.2 kg Most recent weight: Weight: 61.2 kg       Palliative Assessment/Data: PPS 30%      Patient Active Problem List   Diagnosis Date Noted   Sepsis ([place]) 06/06/2020   Advanced care planning/counseling discussion    Palliative care by specialist    Acute respiratory failure with hypoxia ([place]) 05/27/2020   Chronic diastolic CHF (congestive heart failure) ([place]) 05/27/2020   Multifocal pneumonia 05/26/2020   Hypoalbuminemia 05/26/2020   Abnormal LFTs 05/26/2020   Iron deficiency anemia due to chronic blood loss 12/26/2019   Port-A-Cath in place 12/18/2018   Anemia 11/12/2018   Anemia due to antineoplastic chemotherapy 11/06/2018   Genetic testing 10/14/2018   Metastasis from malignant tumor of stomach ([place]) 10/04/2018   Gastric cancer ([place]) 10/04/2018   Family history of prostate cancer    Family history of breast cancer    Aortic atherosclerosis ([place]) 10/03/2018   Acute pulmonary embolus ([place]) 09/20/2018   Pulmonary embolism ([place]) 09/19/2018   Liver metastases ([place]) 09/19/2018   Abdominal neoplasm without bowel obstruction 09/19/2018   Nausea 09/19/2018   Abdominal pain 09/19/2018   Upper respiratory infection 08/01/2018   Varicose vein of leg 07/22/2018   Weight loss 01/18/2018   Claudication ([place]) 01/16/2017    Macular hole, right 09/19/2016   Macular hole, right eye 09/19/2016   Right sciatic nerve pain 07/19/2016   Finger mass, right 06/24/2015   Prostate cancer ([place]) 09/16/2013   Rash and nonspecific skin eruption 02/12/2013   Goals of care, counseling/discussion 09/01/2011   TOBACCO USE, QUIT 08/25/2009   ERECTILE DYSFUNCTION 03/03/2009   PSA, INCREASED 03/03/2009   Essential hypertension 06/10/2007   DIVERTICULOSIS, COLON 06/10/2007   COLONIC POLYPS, HX OF 06/10/2007    Palliative Care Assessment & Plan   Patient Profile: Briefly, Peter Wood is an 85 year old male with past medical history of prostate cancer status post XRT, gastric metastatic adenocarcinoma diagnosed in January 2020 status post chemotherapy, recent admission with respiratory failure thought to be most likely pneumonitis related to treatment versus (more likely) lymphangitic spread.  He was discharged home with hospice services yesterday but presented to the emergency room after he developed fever at home and was sleepier and family called EMS.  Palliative consulted for goals of care.  Assessment: Metastatic gastric cancer, likely lymphangitic spread Acute respiratory failure Multifocal pneumonia Persistent loculated RU pleural effusion Sepsis Chronic diastolic CHF Severe protein calorie malnutrition  Recommendations/Plan:  Continue current plan of care and medical management inpatient.   Family considering hospice facility options. TOC team following and arranging.    Code Status: DNR   Code Status Orders  (From admission, onward)         Start     Ordered   05/12/2020 2307  Do not attempt resuscitation (DNR)  Continuous       Question Answer Comment  In the event of cardiac or respiratory ARREST Do not call a code blue   In the event of cardiac or respiratory ARREST Do not perform Intubation, CPR, defibrillation or ACLS   In the event of cardiac or respiratory ARREST Use medication by  any route, position, wound care, and other measures to relive pain and suffering. May use oxygen,  suction and manual treatment of airway obstruction as needed for comfort.      05/28/2020 2306        Code Status History    Date Active Date Inactive Code Status Order ID Comments User Context   06/04/2020 0947 05/29/2020 2306 DNR [id number]  Dorie Rank, MD ED   05/30/2020 1059 06/06/2020 1741 DNR [id number]  Marshell Garfinkel, MD Inpatient   05/26/2020 0109 05/30/2020 1059 Full Code [id number]  Mikki Harbor, MD ED   09/19/2018 1718 09/25/2018 1751 Full Code [id number]  Janora Norlander, MD ED   09/19/2016 1634 09/20/2016 1206 Full Code [id number]  [name], MD Inpatient   Advance Care Planning Activity       Prognosis:  Poor prognosis  Discharge Planning:  To Be Determined: likely inpatient hospice  Care plan was discussed with RN, RN CM, wife  Thank you for allowing the Palliative Medicine Team to assist in the care of this patient.   Total Time 20 Prolonged Time Billed  no      Greater than 50%  of this time was spent counseling and coordinating care related to the above assessment and plan.  [name], FNP-C Palliative Medicine Team  Phone: [phone] Fax: [phone]  Please contact Palliative Medicine Team phone at [phone] for questions and concerns.

## 2020-06-08 NOTE — Progress Notes (Addendum)
   06/08/20 1243  Assess: MEWS Score  Temp 98.4 F (36.9 C)  BP (!) 154/90  Pulse Rate (!) 106  Resp (!) 22  Level of Consciousness Alert  SpO2 90 %  O2 Device Non-rebreather Mask  O2 Flow Rate (L/min) 15 L/min  Assess: MEWS Score  MEWS Temp 0  MEWS Systolic 0  MEWS Pulse 1  MEWS RR 1  MEWS LOC 0  MEWS Score 2  MEWS Score Color Yellow  Assess: if the MEWS score is Yellow or Red  Were vital signs taken at a resting state? Yes  Focused Assessment No change from prior assessment  Early Detection of Sepsis Score *See Row Information* High  MEWS guidelines implemented *See Row Information* Yes  Treat  MEWS Interventions Escalated (See documentation below)  Pain Scale 0-10  Pain Score 0  Notify: Charge Nurse/RN  Name of Charge Nurse/RN Notified Jake Samples RN  Date Charge Nurse/RN Notified 06/08/20  Time Charge Nurse/RN Notified 8412   8208:  Dr. Sabino Gasser notified of pt status.  Pt oxygen sats occasionally drops into 70's.  Dr. Sabino Gasser to bedside to assess pt. Will continue to monitor pt.

## 2020-06-08 NOTE — Progress Notes (Addendum)
243 pm Spoke to wife and dtr at bedside. They are going to discuss with family about transitioning pt to residential hospice at [facility] or [facility]. [facility] did have a bed available. Waiting on call back from [facility] rep, on [employer] availability. Jonnie Finner RN CCM, [facility] ED TOC CM [phone]    [id number] [place] CM spoke to grandson, Luane School. States he wanted to transition on [facility]. [place], spoke to [facility] and they do have a Residential Hospice bed. Yolanda Bonine will give CM a call back to after he discuss with family. TOC CM contacted attending. [place], [place] ED TOC CM [phone]

## 2020-06-08 NOTE — Assessment & Plan Note (Signed)
-  Due to underlying malignancy -Overall prognosis remains poor

## 2020-06-08 NOTE — Assessment & Plan Note (Addendum)
-  Appreciate oncology input.  Hospice recommended by oncology and I would also agree; family now electing for comfort care on 9/30 (prior to this has been more aggressive measures)

## 2020-06-08 NOTE — Progress Notes (Signed)
[facility]:   Received Telephone call this am from pt's grandson Luane School. He states that pt is needing hospice care and they have made referral to [facility] but he would prefer our agency.  Grandson was instructed to notify the case manger or social worker that is working with his grand father and let them know his preference. They can make official referral to our agency and we would be happy to serve the pt.  Webb Silversmith RN [phone].

## 2020-06-08 NOTE — Assessment & Plan Note (Signed)
-   d/c IVF - monitor fluid status

## 2020-06-08 NOTE — Progress Notes (Signed)
PROGRESS NOTE    Peter Wood   XMI:[id number]  DOB: 30-Jul-1935  DOA: 05/16/2020     1  PCP: Cassandria Anger, MD  CC: Shortness of breath  Hospital Course: Peter Wood is an 85 yo male with PMH stage IV gastric adenocarcinoma who presented with SOB at home. He follows closely with oncology and has been declining rapidly recently. He has had outpatient antibiotics recently with minimal improvement and now presents with worsening SOB/dyspnea. There is concern for lymphangitic spread from his cancer.  He was started on vancomycin and cefepime on admission.  Oncology was also consulted and recommended transitioning to hospice.  Family has been discussing this since admission.   Interval History:  Seen after getting to his room from the ER.  He was being placed on nonrebreather due to worsening hypoxia.  He was in no distress and able to carry on conversation.  He declines wanting to escalate oxygen further and is amenable with starting to pursue hospice.  Old records reviewed in assessment of this patient  ROS: Constitutional: positive for fatigue and malaise, Respiratory: positive for Shortness of breath, Cardiovascular: negative for chest pain and Gastrointestinal: negative for abdominal pain  Assessment & Plan: Severe sepsis (HCC) - tachycardia, tachypnea, leukocytosis, lactic 2.3 on admission; source presumed pulmonary - given ongoing physical decline with poor prognosis, patient and family now considering transition to hospice - has been escalated to NRB; will not go to [facility] after discussion with patient; also remains DNR/DNI - continue vanc/cefepime until further family discussion and pending hospice placement   Metastasis from malignant tumor of stomach ([place]) -Appreciate oncology input.  Transitioning to hospice at this time is reasonable and appropriate -Follow-up family discussions  Goals of care, counseling/discussion -Discussed with patient bedside this morning.   He is in agreement for transitioning to hospice and planning to discuss further with family - continue abx for now but will likely d/c soon - if further SOB/hypoxia/dyspnea will treat with morphine for air hunger rather than escalate O2  Chronic diastolic CHF (congestive heart failure) (HCC) - d/c IVF - monitor fluid status   Protein-calorie malnutrition, severe (HCC) -Due to underlying malignancy -Overall prognosis remains poor  Macrocytic anemia -Baseline hemoglobin 8 to 9 g/dL -Continue monitoring.  Remains at baseline  Hypernatremia -Consider due to poor oral intake -Will forego giving fluids at this time given discussions on transitioning to hospice as well as in efforts to prevent worsening of respiratory status   Antimicrobials: Azithromycin 05/12/2020 x1 Rocephin 05/29/2020 x 1 Cefepime 05/12/2020>> present Vancomycin 05/16/2020>> present  DVT prophylaxis: Eliquis Code Status: DNR Family Communication: Wife and daughter Disposition Plan: Status is: Inpatient  Remains inpatient appropriate because:IV treatments appropriate due to intensity of illness or inability to take PO, Inpatient level of care appropriate due to severity of illness and Approaching end-of-life   Dispo: The patient is from: Home              Anticipated d/c is to: Residential hospice              Anticipated d/c date is: 2 days              Patient currently is not medically stable to d/c.       Objective: Blood pressure (!) 133/93, pulse (!) 116, temperature (!) 97.5 F (36.4 C), temperature source Oral, resp. rate 18, height 5\' 4"  (1.626 m), weight 61.2 kg, SpO2 92 %.  Examination: General appearance: Chronically ill-appearing and cachectic elderly man  laying in bed in no distress but appears uncomfortable Head: Normocephalic, without obvious abnormality, atraumatic Eyes: EOMI Lungs: Coarse breath sounds bilaterally throughout Heart: regular rate and rhythm and S1, S2 normal Abdomen: normal  findings: bowel sounds normal and soft, non-tender Extremities: Thin, no edema Skin: mobility and turgor normal Neurologic: No focal deficits  Consultants:   Oncology  Procedures:   None  Data Reviewed: I have personally reviewed following labs and imaging studies Results for orders placed or performed during the hospital encounter of 05/24/2020 (from the past 24 hour(s))  CBC     Status: Abnormal   Collection Time: 05/26/2020 11:07 PM  Result Value Ref Range   WBC 24.7 (H) 4.0 - 10.5 K/uL   RBC 2.65 (L) 4.22 - 5.81 MIL/uL   Hemoglobin 9.1 (L) 13.0 - 17.0 g/dL   HCT 28.5 (L) 39 - 52 %   MCV 107.5 (H) 80.0 - 100.0 fL   MCH 34.3 (H) 26.0 - 34.0 pg   MCHC 31.9 30.0 - 36.0 g/dL   RDW 17.5 (H) 11.5 - 15.5 %   Platelets 205 150 - 400 K/uL   nRBC 0.1 0.0 - 0.2 %  Creatinine, serum     Status: Abnormal   Collection Time: 05/20/2020 11:07 PM  Result Value Ref Range   Creatinine, Ser 0.50 (L) 0.61 - 1.24 mg/dL   GFR calc non Af Amer >60 >60 mL/min   GFR calc Af Amer >60 >60 mL/min  Urinalysis, Routine w reflex microscopic     Status: Abnormal   Collection Time: 06/08/20 12:47 AM  Result Value Ref Range   Color, Urine YELLOW YELLOW   APPearance CLEAR CLEAR   Specific Gravity, Urine 1.020 1.005 - 1.030   pH 6.0 5.0 - 8.0   Glucose, UA NEGATIVE NEGATIVE mg/dL   Hgb urine dipstick MODERATE (A) NEGATIVE   Bilirubin Urine NEGATIVE NEGATIVE   Ketones, ur NEGATIVE NEGATIVE mg/dL   Protein, ur 30 (A) NEGATIVE mg/dL   Nitrite NEGATIVE NEGATIVE   Leukocytes,Ua NEGATIVE NEGATIVE   WBC, UA 0-5 0 - 5 WBC/hpf   Bacteria, UA NONE SEEN NONE SEEN   Hyaline Casts, UA PRESENT   Protime-INR     Status: Abnormal   Collection Time: 06/08/20  4:45 AM  Result Value Ref Range   Prothrombin Time 17.8 (H) 11.4 - 15.2 seconds   INR 1.5 (H) 0.8 - 1.2  Cortisol-am, blood     Status: Abnormal   Collection Time: 06/08/20  4:45 AM  Result Value Ref Range   Cortisol - AM 36.8 (H) 6.7 - 22.6 ug/dL    Procalcitonin     Status: None   Collection Time: 06/08/20  4:45 AM  Result Value Ref Range   Procalcitonin 3.64 ng/mL  Comprehensive metabolic panel     Status: Abnormal   Collection Time: 06/08/20  4:45 AM  Result Value Ref Range   Sodium 149 (H) 135 - 145 mmol/L   Potassium 3.5 3.5 - 5.1 mmol/L   Chloride 108 98 - 111 mmol/L   CO2 32 22 - 32 mmol/L   Glucose, Bld 109 (H) 70 - 99 mg/dL   BUN 25 (H) 8 - 23 mg/dL   Creatinine, Ser 0.48 (L) 0.61 - 1.24 mg/dL   Calcium 8.8 (L) 8.9 - 10.3 mg/dL   Total Protein 5.3 (L) 6.5 - 8.1 g/dL   Albumin 2.2 (L) 3.5 - 5.0 g/dL   AST 54 (H) 15 - 41 U/L   ALT 115 (H) 0 -  44 U/L   Alkaline Phosphatase 429 (H) 38 - 126 U/L   Total Bilirubin 1.0 0.3 - 1.2 mg/dL   GFR calc non Af Amer >60 >60 mL/min   GFR calc Af Amer >60 >60 mL/min   Anion gap 9 5 - 15  CBC     Status: Abnormal   Collection Time: 06/08/20  4:45 AM  Result Value Ref Range   WBC 23.8 (H) 4.0 - 10.5 K/uL   RBC 2.61 (L) 4.22 - 5.81 MIL/uL   Hemoglobin 8.8 (L) 13.0 - 17.0 g/dL   HCT 28.3 (L) 39 - 52 %   MCV 108.4 (H) 80.0 - 100.0 fL   MCH 33.7 26.0 - 34.0 pg   MCHC 31.1 30.0 - 36.0 g/dL   RDW 17.4 (H) 11.5 - 15.5 %   Platelets 198 150 - 400 K/uL   nRBC 0.2 0.0 - 0.2 %  MRSA PCR Screening     Status: None   Collection Time: 06/08/20  1:30 PM   Specimen: Nasopharyngeal  Result Value Ref Range   MRSA by PCR NEGATIVE NEGATIVE    Recent Results (from the past 240 hour(s))  MRSA PCR Screening     Status: None   Collection Time: 05/30/20 11:18 AM   Specimen: Nasal Mucosa; Nasopharyngeal  Result Value Ref Range Status   MRSA by PCR NEGATIVE NEGATIVE Final    Comment:        The GeneXpert MRSA Assay (FDA approved for NASAL specimens only), is one component of a comprehensive MRSA colonization surveillance program. It is not intended to diagnose MRSA infection nor to guide or monitor treatment for MRSA infections. Performed at [facility], [place]  [address], [place], [place] [address]   Respiratory Panel by RT PCR (Flu A&B, Covid) - Nasopharyngeal Swab     Status: None   Collection Time: 06/05/2020 10:02 AM   Specimen: Nasopharyngeal Swab  Result Value Ref Range Status   SARS Coronavirus 2 by RT PCR NEGATIVE NEGATIVE Final    Comment: (NOTE) SARS-CoV-2 target nucleic acids are NOT DETECTED.  The SARS-CoV-2 RNA is generally detectable in upper respiratoy specimens during the acute phase of infection. The lowest concentration of SARS-CoV-2 viral copies this assay can detect is 131 copies/mL. A negative result does not preclude SARS-Cov-2 infection and should not be used as the sole basis for treatment or other patient management decisions. A negative result may occur with  improper specimen collection/handling, submission of specimen other than nasopharyngeal swab, presence of viral mutation(s) within the areas targeted by this assay, and inadequate number of viral copies (<131 copies/mL). A negative result must be combined with clinical observations, patient history, and epidemiological information. The expected result is Negative.  Fact Sheet for Patients:  PinkCheek.be  Fact Sheet for Healthcare Providers:  GravelBags.it  This test is no t yet approved or cleared by the [place] FDA and  has been authorized for detection and/or diagnosis of SARS-CoV-2 by FDA under an Emergency Use Authorization (EUA). This EUA will remain  in effect (meaning this test can be used) for the duration of the COVID-19 declaration under Section 564(b)(1) of the Act, 21 U.S.C. section 360bbb-3(b)(1), unless the authorization is terminated or revoked sooner.     Influenza A by PCR NEGATIVE NEGATIVE Final   Influenza B by PCR NEGATIVE NEGATIVE Final    Comment: (NOTE) The Xpert Xpress SARS-CoV-2/FLU/RSV assay is intended as an aid in  the diagnosis of influenza from Nasopharyngeal swab  specimens and  should not be used as a sole basis for treatment. Nasal washings and  aspirates are unacceptable for Xpert Xpress SARS-CoV-2/FLU/RSV  testing.  Fact Sheet for Patients: PinkCheek.be  Fact Sheet for Healthcare Providers: GravelBags.it  This test is not yet approved or cleared by the [place] FDA and  has been authorized for detection and/or diagnosis of SARS-CoV-2 by  FDA under an Emergency Use Authorization (EUA). This EUA will remain  in effect (meaning this test can be used) for the duration of the  Covid-19 declaration under Section 564(b)(1) of the Act, 21  U.S.C. section 360bbb-3(b)(1), unless the authorization is  terminated or revoked. Performed at [facility], [place] [address], [place], [place] [address]   Blood culture (routine single)     Status: None (Preliminary result)   Collection Time: 05/31/2020 10:06 AM   Specimen: BLOOD  Result Value Ref Range Status   Specimen Description   Final    BLOOD BLOOD RIGHT FOREARM Performed at [facility] [address], [place], [place] [address]    Special Requests   Final    BOTTLES DRAWN AEROBIC ONLY Blood Culture results may not be optimal due to an inadequate volume of blood received in culture bottles Performed at [facility] [address] [place], [place] [address]    Culture   Final    NO GROWTH < 24 HOURS Performed at [facility] [address], [place], [place] [address]    Report Status PENDING  Incomplete  Culture, blood (single)     Status: None (Preliminary result)   Collection Time: 06/04/2020 12:31 PM   Specimen: BLOOD  Result Value Ref Range Status   Specimen Description   Final    BLOOD BLOOD RIGHT HAND Performed at [facility] [address] [place], [place] [address]    Special Requests   Final    BOTTLES DRAWN AEROBIC ONLY Blood Culture  results may not be optimal due to an inadequate volume of blood received in culture bottles Performed at [facility] [address], [place], [place] [address]    Culture   Final    NO GROWTH < 12 HOURS Performed at [facility] [address], [place],  [address]    Report Status PENDING  Incomplete  MRSA PCR Screening     Status: None   Collection Time: 06/08/20  1:30 PM   Specimen: Nasopharyngeal  Result Value Ref Range Status   MRSA by PCR NEGATIVE NEGATIVE Final    Comment:        The GeneXpert MRSA Assay (FDA approved for NASAL specimens only), is one component of a comprehensive MRSA colonization surveillance program. It is not intended to diagnose MRSA infection nor to guide or monitor treatment for MRSA infections. Performed at [facility], [place] [address], [place],  [address]      Radiology Studies: DG Chest Port 1 View  Result Date: 06/05/2020 CLINICAL DATA:  Dyspnea, CHF, hypertension EXAM: PORTABLE CHEST 1 VIEW COMPARISON:  Portable exam 0934 hours compared to 06/01/2020 FINDINGS: RIGHT jugular Port-A-Cath unchanged tip projecting over SVC. Normal heart size, mediastinal contours, and pulmonary vascularity. Hazy BILATERAL airspace infiltrates, slightly improved in lingula. Volume loss and loculated effusion at upper RIGHT chest unchanged. No pneumothorax or acute osseous findings. IMPRESSION: Persistent loculated effusion in volume loss in the upper RIGHT chest. Hazy BILATERAL airspace infiltrates question multifocal pneumonia, slightly improved  on LEFT. Electronically Signed   By: Lavonia Dana M.D.   On: 05/15/2020 09:49   DG Chest Port 1 View  Final Result      Scheduled Meds: . apixaban  2.5 mg Oral BID  . Chlorhexidine Gluconate Cloth  6 each Topical Daily  . latanoprost  1 drop Both Eyes QHS  . mouth rinse  15 mL Mouth Rinse BID   PRN Meds: sodium chloride, acetaminophen **OR** acetaminophen,  HYDROcodone-acetaminophen, ondansetron **OR** ondansetron (ZOFRAN) IV Continuous Infusions: . sodium chloride 10 mL/hr at 06/08/20 1309  . ceFEPime (MAXIPIME) IV 2 g (06/08/20 1040)  . vancomycin        LOS: 1 day  Time spent: Greater than 50% of the 35 minute visit was spent in counseling/coordination of care for the patient as laid out in the A&P.   Dwyane Dee, MD [facility] 06/08/2020, 3:27 PM  Contact via secure chat.  To contact the attending provider between 7A-7P or the covering provider during after hours 7P-7A, please log into the web site [email or web address] and access using universal [place] password for that web site. If you do not have the password, please call the hospital operator.

## 2020-06-08 NOTE — Assessment & Plan Note (Addendum)
-  Patient and family changed goals of care during hospitalization. We pursued aggressive care after multiple discussions, however by 06/10/2020 after further discussion with palliative care, family has elected to pursue comfort care once again. Patient is continuing to decline and not responding to treatment. Oncology has also evaluated the patient and agrees with pursuing hospice care -Nonessential medications have been discontinued -Current level of oxygen is being continued for now while further family is arriving from out of town then will likely de-escalate

## 2020-06-09 ENCOUNTER — Inpatient Hospital Stay (HOSPITAL_COMMUNITY)

## 2020-06-09 DIAGNOSIS — E43 Unspecified severe protein-calorie malnutrition: Secondary | ICD-10-CM

## 2020-06-09 LAB — CBC WITH DIFFERENTIAL/PLATELET
Abs Immature Granulocytes: 0.2 10*3/uL — ABNORMAL HIGH (ref 0.00–0.07)
Basophils Absolute: 0 10*3/uL (ref 0.0–0.1)
Basophils Relative: 0 %
Eosinophils Absolute: 0.1 10*3/uL (ref 0.0–0.5)
Eosinophils Relative: 0 %
HCT: 28.7 % — ABNORMAL LOW (ref 39.0–52.0)
Hemoglobin: 8.7 g/dL — ABNORMAL LOW (ref 13.0–17.0)
Immature Granulocytes: 1 %
Lymphocytes Relative: 4 %
Lymphs Abs: 0.9 10*3/uL (ref 0.7–4.0)
MCH: 33.5 pg (ref 26.0–34.0)
MCHC: 30.3 g/dL (ref 30.0–36.0)
MCV: 110.4 fL — ABNORMAL HIGH (ref 80.0–100.0)
Monocytes Absolute: 0.7 10*3/uL (ref 0.1–1.0)
Monocytes Relative: 3 %
Neutro Abs: 20.6 10*3/uL — ABNORMAL HIGH (ref 1.7–7.7)
Neutrophils Relative %: 92 %
Platelets: 192 10*3/uL (ref 150–400)
RBC: 2.6 MIL/uL — ABNORMAL LOW (ref 4.22–5.81)
RDW: 17.2 % — ABNORMAL HIGH (ref 11.5–15.5)
WBC: 22.5 10*3/uL — ABNORMAL HIGH (ref 4.0–10.5)
nRBC: 0.1 % (ref 0.0–0.2)

## 2020-06-09 LAB — BASIC METABOLIC PANEL
Anion gap: 13 (ref 5–15)
BUN: 24 mg/dL — ABNORMAL HIGH (ref 8–23)
CO2: 31 mmol/L (ref 22–32)
Calcium: 8.7 mg/dL — ABNORMAL LOW (ref 8.9–10.3)
Chloride: 107 mmol/L (ref 98–111)
Creatinine, Ser: 0.53 mg/dL — ABNORMAL LOW (ref 0.61–1.24)
GFR calc Af Amer: >60 mL/min (ref >60)
GFR calc non Af Amer: >60 mL/min (ref >60)
Glucose, Bld: 89 mg/dL (ref 70–99)
Potassium: 3.1 mmol/L — ABNORMAL LOW (ref 3.5–5.1)
Sodium: 151 mmol/L — ABNORMAL HIGH (ref 135–145)

## 2020-06-09 LAB — MAGNESIUM: Magnesium: 2.1 mg/dL (ref 1.7–2.4)

## 2020-06-09 MED ORDER — METHYLPREDNISOLONE SODIUM SUCC 40 MG IJ SOLR
40.0000 mg | Freq: Two times a day (BID) | INTRAMUSCULAR | Status: DC
  Administered 2020-06-09 – 2020-06-10 (×2): 40 mg via INTRAVENOUS
  Filled 2020-06-09 (×2): qty 1

## 2020-06-09 NOTE — Progress Notes (Signed)
TOC CM spoke to attending. Contacted family and plan if for pt to remain in hospital. Unable to [facility]. [place] to cancel referral for [facility]. [place], [place] ED TOC CM [phone]

## 2020-06-09 NOTE — Progress Notes (Signed)
Nutrition Brief Note  Patient screened for on the Malnutrition Screening Tool. Chart reviewed. Patient with stage 4 gastric cancer. Patient transitioned to comfort care and plan is for transfer to [place]. Hospice note from yesterday evening indicates that discharge may occur as soon today (9/29).   No further nutrition interventions warranted at this time.  Please consult as needed.      Jarome Matin, MS, RD, LDN, CNSC Inpatient Clinical Dietitian RD pager # available in [place]  After hours/weekend pager # available in [facility]

## 2020-06-09 NOTE — Progress Notes (Signed)
PHARMACY NOTE -  Cefepime  Pharmacy has been assisting with dosing of cefepime for sepsis.  Dosage remains stable at 2g IV q12 hr and need for further dosage adjustment appears unlikely at present given SCr at baseline; expect narrowing soon  Pharmacy will sign off, following peripherally for culture results or dose adjustments. Please reconsult if a change in clinical status warrants re-evaluation of dosage.  Reuel Boom, PharmD, BCPS [phone] 06/09/2020, 10:00 AM

## 2020-06-09 NOTE — Progress Notes (Signed)
PROGRESS NOTE    Peter Wood   XTG:[id number]  DOB: Feb 02, 1935  DOA: 05/30/2020     2  PCP: Cassandria Anger, MD  CC: Shortness of breath  Hospital Course: Peter Wood is an 85 yo male with PMH stage IV gastric adenocarcinoma who presented with SOB at home. He follows closely with oncology and has been declining rapidly recently. He has had outpatient antibiotics recently with minimal improvement and now presents with worsening SOB/dyspnea. There is concern for lymphangitic spread from his cancer.  Of note, he was just hospitalized 9/14 - 9/25 for pneumonia and hypoxic respiratory failure.  He was discharged home with intentions for pursuing hospice and comfort/end-of-life care.  Upon returning to the hospital again for this hospitalization, there was significant hesitation from the patient and family regarding the current goals of care.  He does remain a DNR/DNI however the morning following admission, he wished to hold off on transitioning to hospice and wanted to pursue antibiotics and ongoing medical care.  He was started on vancomycin and cefepime on admission.  Multiple discussions were held bedside with patient and family regarding that aggressive actions would likely be deemed futile and that the patient would progressively decline given his overall frail state.  Nevertheless, the patient did voice wishes to continue antibiotics and hold off on hospice on the morning of 06/09/2020.  He did however express understanding that if he did not improve, he knows he would decline and approach end of life.    Interval History:  Patient seen this morning with multiple family members bedside.  Peter Wood was also called during rounds and spoke to on the phone with his other family members present. The patient was uncomfortable and using accessory muscles for breathing.  Despite this, when reviewing goals of care, he wished to forego hospice for now and to continue treatment.    Old records reviewed in assessment of this patient  ROS: Constitutional: positive for fatigue and malaise, Respiratory: positive for Shortness of breath, Cardiovascular: negative for chest pain and Gastrointestinal: negative for abdominal pain  Assessment & Plan: * Severe sepsis (HCC) - tachycardia, tachypnea, leukocytosis, lactic 2.3 on admission; source presumed pulmonary - given ongoing physical decline with poor prognosis, patient and family were considering transition to hospice however as of morning of 9/29, seems that everyone now wishes to remain "aggressive" - overnight he escalated O2 up to NRB+salter HF; given patient wishes, will escalate further to optiflow if need be. Still DNR/DNI.  - d/c vanc; has completed approx 48 hr and the MRSA screen is negative - continue cefepime; does not appear to be any underlying concern for aspiration - repeat CXR this am (reveals significant worsening) -Start Solu-Medrol  Hypernatremia -Considered due to poor oral intake - mediocre intake since admit per family bedside; Na worse today - giving IVF will inevitably worsen respiratory status, therefore hold off on IVF/free water  Goals of care, counseling/discussion -Discussed with patient bedside on admission and initally he was amenable to hospice, however since then this has changed. He is now wishing to remain on abx and high levels of O2 in efforts to possibly improve. GOC were also discussed bedside with his daughters and his grandson Peter Wood (via phone). For now, family consensus is to continue medical care until/unless he continues to further decline  Metastatic malignant neoplasm [facility]) -Appreciate oncology input.  Hospice recommended by oncology and I would also agree; family and patient however are wishing to pursue ongoing medical treatment  Macrocytic  anemia -Baseline hemoglobin 8 to 9 g/dL -Continue monitoring.  Remains at baseline  Protein-calorie malnutrition, severe  ([place]) -Due to underlying malignancy -Overall prognosis remains poor  Chronic diastolic CHF (congestive heart failure) (HCC) - d/c IVF - monitor fluid status     Antimicrobials: Azithromycin 06/02/2020 x1 Rocephin 05/22/2020 x 1 Cefepime 05/26/2020>> present Vancomycin 05/20/2020>> 9/29  DVT prophylaxis: Eliquis Code Status: DNR Family Communication: Wife and daughter Disposition Plan: Status is: Inpatient  Remains inpatient appropriate because:IV treatments appropriate due to intensity of illness or inability to take PO, Inpatient level of care appropriate due to severity of illness and Approaching end-of-life   Dispo: The patient is from: Home              Anticipated d/c is to: [facility]              Anticipated d/c date is: 2 days              Patient currently is not medically stable to d/c.  Objective: Blood pressure (!) 153/102, pulse (!) 127, temperature 98.4 F (36.9 C), temperature source Oral, resp. rate (!) 24, height 5\' 4"  (1.626 m), weight 61.2 kg, SpO2 93 %.  Examination: General appearance: Chronically ill-appearing and cachectic elderly man laying in bed in no distress but appears uncomfortable Head: Normocephalic, without obvious abnormality, atraumatic Eyes: EOMI Lungs: Coarse breath sounds bilaterally throughout Heart: regular rate and rhythm and S1, S2 normal Abdomen: normal findings: bowel sounds normal and soft, non-tender Extremities: Thin, no edema Skin: mobility and turgor normal Neurologic: No focal deficits  Consultants:   Oncology  Procedures:   None  Data Reviewed: I have personally reviewed following labs and imaging studies Results for orders placed or performed during the hospital encounter of 05/31/2020 (from the past 24 hour(s))  Basic metabolic panel     Status: Abnormal   Collection Time: 06/09/20  4:01 AM  Result Value Ref Range   Sodium 151 (H) 135 - 145 mmol/L   Potassium 3.1 (L) 3.5 - 5.1 mmol/L   Chloride 107 98 - 111  mmol/L   CO2 31 22 - 32 mmol/L   Glucose, Bld 89 70 - 99 mg/dL   BUN 24 (H) 8 - 23 mg/dL   Creatinine, Ser 0.53 (L) 0.61 - 1.24 mg/dL   Calcium 8.7 (L) 8.9 - 10.3 mg/dL   GFR calc non Af Amer >60 >60 mL/min   GFR calc Af Amer >60 >60 mL/min   Anion gap 13 5 - 15  CBC with Differential/Platelet     Status: Abnormal   Collection Time: 06/09/20  4:01 AM  Result Value Ref Range   WBC 22.5 (H) 4.0 - 10.5 K/uL   RBC 2.60 (L) 4.22 - 5.81 MIL/uL   Hemoglobin 8.7 (L) 13.0 - 17.0 g/dL   HCT 28.7 (L) 39 - 52 %   MCV 110.4 (H) 80.0 - 100.0 fL   MCH 33.5 26.0 - 34.0 pg   MCHC 30.3 30.0 - 36.0 g/dL   RDW 17.2 (H) 11.5 - 15.5 %   Platelets 192 150 - 400 K/uL   nRBC 0.1 0.0 - 0.2 %   Neutrophils Relative % 92 %   Neutro Abs 20.6 (H) 1.7 - 7.7 K/uL   Lymphocytes Relative 4 %   Lymphs Abs 0.9 0.7 - 4.0 K/uL   Monocytes Relative 3 %   Monocytes Absolute 0.7 0 - 1 K/uL   Eosinophils Relative 0 %   Eosinophils Absolute 0.1 0 -  0 K/uL   Basophils Relative 0 %   Basophils Absolute 0.0 0 - 0 K/uL   Immature Granulocytes 1 %   Abs Immature Granulocytes 0.20 (H) 0.00 - 0.07 K/uL  Magnesium     Status: None   Collection Time: 06/09/20  4:01 AM  Result Value Ref Range   Magnesium 2.1 1.7 - 2.4 mg/dL    Recent Results (from the past 240 hour(s))  Respiratory Panel by RT PCR (Flu A&B, Covid) - Nasopharyngeal Swab     Status: None   Collection Time: 05/18/2020 10:02 AM   Specimen: Nasopharyngeal Swab  Result Value Ref Range Status   SARS Coronavirus 2 by RT PCR NEGATIVE NEGATIVE Final    Comment: (NOTE) SARS-CoV-2 target nucleic acids are NOT DETECTED.  The SARS-CoV-2 RNA is generally detectable in upper respiratoy specimens during the acute phase of infection. The lowest concentration of SARS-CoV-2 viral copies this assay can detect is 131 copies/mL. A negative result does not preclude SARS-Cov-2 infection and should not be used as the sole basis for treatment or other patient management  decisions. A negative result may occur with  improper specimen collection/handling, submission of specimen other than nasopharyngeal swab, presence of viral mutation(s) within the areas targeted by this assay, and inadequate number of viral copies (<131 copies/mL). A negative result must be combined with clinical observations, patient history, and epidemiological information. The expected result is Negative.  Fact Sheet for Patients:  PinkCheek.be  Fact Sheet for Healthcare Providers:  GravelBags.it  This test is no t yet approved or cleared by the [place] FDA and  has been authorized for detection and/or diagnosis of SARS-CoV-2 by FDA under an Emergency Use Authorization (EUA). This EUA will remain  in effect (meaning this test can be used) for the duration of the COVID-19 declaration under Section 564(b)(1) of the Act, 21 U.S.C. section 360bbb-3(b)(1), unless the authorization is terminated or revoked sooner.     Influenza A by PCR NEGATIVE NEGATIVE Final   Influenza B by PCR NEGATIVE NEGATIVE Final    Comment: (NOTE) The Xpert Xpress SARS-CoV-2/FLU/RSV assay is intended as an aid in  the diagnosis of influenza from Nasopharyngeal swab specimens and  should not be used as a sole basis for treatment. Nasal washings and  aspirates are unacceptable for Xpert Xpress SARS-CoV-2/FLU/RSV  testing.  Fact Sheet for Patients: PinkCheek.be  Fact Sheet for Healthcare Providers: GravelBags.it  This test is not yet approved or cleared by the [place] FDA and  has been authorized for detection and/or diagnosis of SARS-CoV-2 by  FDA under an Emergency Use Authorization (EUA). This EUA will remain  in effect (meaning this test can be used) for the duration of the  Covid-19 declaration under Section 564(b)(1) of the Act, 21  U.S.C. section 360bbb-3(b)(1), unless the  authorization is  terminated or revoked. Performed at [facility], [place] [address] [place], [place] [address]   Blood culture (routine single)     Status: None (Preliminary result)   Collection Time: 05/28/2020 10:06 AM   Specimen: BLOOD  Result Value Ref Range Status   Specimen Description   Final    BLOOD BLOOD RIGHT FOREARM Performed at [facility] [address], [place], [place] [address]    Special Requests   Final    BOTTLES DRAWN AEROBIC ONLY Blood Culture results may not be optimal due to an inadequate volume of blood received in culture bottles Performed at [facility]  [address], [place], [place] [address]    Culture   Final    NO GROWTH 2 DAYS Performed at [facility] [address], [place], [place] [address]    Report Status PENDING  Incomplete  Culture, blood (single)     Status: None (Preliminary result)   Collection Time: 05/26/2020 12:31 PM   Specimen: BLOOD  Result Value Ref Range Status   Specimen Description   Final    BLOOD BLOOD RIGHT HAND Performed at [facility] [address], [place], [place] [address]    Special Requests   Final    BOTTLES DRAWN AEROBIC ONLY Blood Culture results may not be optimal due to an inadequate volume of blood received in culture bottles Performed at [facility] [address], [place], [place] [address]    Culture   Final    NO GROWTH 2 DAYS Performed at [facility] [address], [place], [place] [address]    Report Status PENDING  Incomplete  Urine culture     Status: None   Collection Time: 06/08/20 12:47 AM   Specimen: In/Out Cath Urine  Result Value Ref Range Status   Specimen Description   Final    IN/OUT CATH URINE Performed at [facility] [address] [place], [place] [address]    Special Requests   Final    NONE Performed at [facility], [place]  [address] [place], [place] [address]    Culture   Final    NO GROWTH Performed at [facility], [place] [address], [place], [place] [address]    Report Status 06/08/2020 FINAL  Final  MRSA PCR Screening     Status: None   Collection Time: 06/08/20  1:30 PM   Specimen: Nasopharyngeal  Result Value Ref Range Status   MRSA by PCR NEGATIVE NEGATIVE Final    Comment:        The GeneXpert MRSA Assay (FDA approved for NASAL specimens only), is one component of a comprehensive MRSA colonization surveillance program. It is not intended to diagnose MRSA infection nor to guide or monitor treatment for MRSA infections. Performed at [facility], [place] [address] [place],  [address]      Radiology Studies: DG CHEST PORT 1 VIEW  Result Date: 06/09/2020 CLINICAL DATA:  Hypoxia EXAM: PORTABLE CHEST 1 VIEW COMPARISON:  June 07, 2020 FINDINGS: Port-A-Cath tip is in the superior vena cava. No pneumothorax. Widespread airspace opacity persists without appreciable change. Apparent loculated fluid right lateral upper chest and apical regions is stable. No new opacity. Heart is upper normal in size with pulmonary vascularity normal. No appreciable adenopathy. No appreciable bone lesions. IMPRESSION: Stable Port-A-Cath position without pneumothorax. Widespread airspace opacity bilaterally, essentially stable. Loculated fluid right upper chest region, stable. No new opacity. Stable cardiac silhouette. Suspect multifocal pneumonia. Advise correlation with COVID-19 status given this appearance. Electronically Signed   By: Lowella Grip III M.D.   On: 06/09/2020 09:59   DG CHEST PORT 1 VIEW  Final Result    DG Chest Port 1 View  Final Result      Scheduled Meds: . apixaban  2.5 mg Oral BID  . Chlorhexidine Gluconate Cloth  6 each Topical Daily  . latanoprost  1 drop Both Eyes QHS  . mouth rinse  15 mL Mouth Rinse BID  . methylPREDNISolone (SOLU-MEDROL) injection   40 mg Intravenous Q12H   PRN Meds: sodium chloride,  acetaminophen **OR** acetaminophen, HYDROcodone-acetaminophen, ondansetron **OR** ondansetron (ZOFRAN) IV, sodium chloride flush Continuous Infusions: . sodium chloride 10 mL/hr at 06/08/20 1309  . ceFEPime (MAXIPIME) IV 2 g (06/09/20 1231)      LOS: 2 days  Time spent: Greater than 50% of the 35 minute visit was spent in counseling/coordination of care for the patient as laid out in the A&P.   Dwyane Dee, MD [facility] 06/09/2020, 2:20 PM  Contact via secure chat.  To contact the attending provider between 7A-7P or the covering provider during after hours 7P-7A, please log into the web site [email or web address] and access using universal [place] password for that web site. If you do not have the password, please call the hospital operator.

## 2020-06-09 NOTE — Progress Notes (Signed)
PMT provider chart reviewed. Discussed with Dr. Sabino Gasser and RN. Dr. Sabino Gasser spoke with patient and family today. Patient/family consensus to continue current medical care until/unless he continues to further decline. Tenuous clinical condition with poor prognosis. PMT will continue to follow for needs.   NO CHARGE  [name], [place], FNP-C Palliative Medicine Team  Phone: [phone] Fax: [phone]

## 2020-06-09 NOTE — Progress Notes (Signed)
   06/09/20 0839  Assess: MEWS Score  BP (!) 150/100  Pulse Rate (!) 115  Resp (!) 24  SpO2 93 %  O2 Device Non-rebreather Mask  Assess: MEWS Score  MEWS Temp 0  MEWS Systolic 0  MEWS Pulse 2  MEWS RR 1  MEWS LOC 0  MEWS Score 3  MEWS Score Color Yellow  Assess: if the MEWS score is Yellow or Red  Were vital signs taken at a resting state? Yes  Focused Assessment Change from prior assessment (see assessment flowsheet)  Early Detection of Sepsis Score *See Row Information* High  MEWS guidelines implemented *See Row Information* Yes  Treat  MEWS Interventions Escalated (See documentation below)  Pain Scale 0-10  Take Vital Signs  Increase Vital Sign Frequency  Yellow: Q 2hr X 2 then Q 4hr X 2, if remains yellow, continue Q 4hrs  Escalate  MEWS: Escalate Yellow: discuss with charge nurse/RN and consider discussing with provider and RRT  Notify: Charge Nurse/RN  Name of Charge Nurse/RN Notified Raynelle Jan  Date Charge Nurse/RN Notified 06/09/20  Time Charge Nurse/RN Notified 0900  Notify: Provider  Provider Name/Title Dr.Guis  Date Provider Notified 06/09/20  Time Provider Notified 0900  Notification Type Face-to-face  Notification Reason Change in status (family at bedside along with provider)  Response Other (Comment)  Date of Provider Response 06/09/20  Time of Provider Response 0901  Pt assessed by hospitalist, Recommendation for heated High flow, RT contacted.

## 2020-06-09 NOTE — Progress Notes (Addendum)
Pt noted to desat on tele monitor. O2 got as low as 69%. Rn to enter room to find pt had removed NRB from face. NRB reapplied. Pt O2 sats only to 75%. 15L HFNC applied for additional O2 support. Pt recovered slowly to 85%, eventually to 94%; maintained at such.   At this time, pt seems to be in NAD. Will continue to assess and monitor pt.

## 2020-06-09 NOTE — Progress Notes (Signed)
[facility]) hospitalized hospice patient    Peter Wood is a newly admitted home hospice patient (on 9/26) with a terminal diagnosis of gastric cancer.  Family activated EMS when patient developed fever of 103F and decreased SpO2 level. Family contacted hospice after calling EMS. Patient was admitted to [facility] on 05/27/2020 with a diagnosis of Sepsis.  Per Dr. Tomasa Hosteller with [facility] this is a related hospital admission.   Visited pt and family at the bedside. Peter Wood is alert and oriented, keeps his eyes closed and answers questions when asked. He denies any current pain/anxiety. Goals of care seem to continue to be a concern. These were addressed during the last hospital admission, but the family and patient are unsure about the route they want to proceed with.  Of note, pt grandson called [facility] to inquire about a bed at their residential inpatient unit. Unclear if family felt that ongoing treatments could occur at this facility (IV Abx).  V/S: 98.4 oral, 153/102, HR 127, RR 24, 93 % SPO2 on heated HFNC 30 lpm I&O: 685/250 Labs: Na 151, K 3.1, BUN 24, WBC 22.5, RBC 2.60, hgb 8.7, HCT 28.7, MCV 110 Diagnostics: portable chest - Widespread airspace opacity persists without appreciable change. Apparent loculated fluid right lateral upper chest and apical regions is stable. No new opacity. IVs/PRNs: solu-medrol 40 mg IV BID, maxipime 2g IV BID  Problem List: - severe sepsis - presumptively PNA, IV abx and steroids, increasing O2 therapy, worsening CXR. - hypernatremia - 149>151, poor oral intake - malignant neoplasm - no further treatment, supportive care only  GOC: not clear. Family wanted to utilize hospice services at home. Pt returned to ED after one day at home.  D/C Planning: ongoing. Pt appears too fragile to return home.  Family: updated in room at bedside, they do not seem to grasp that he is progressing to end of life IDT: hospice team  updated  Venia Carbon RN, BSN, [facility] Liaison

## 2020-06-10 DIAGNOSIS — E870 Hyperosmolality and hypernatremia: Secondary | ICD-10-CM | POA: Diagnosis not present

## 2020-06-10 DIAGNOSIS — J189 Pneumonia, unspecified organism: Secondary | ICD-10-CM | POA: Diagnosis not present

## 2020-06-10 DIAGNOSIS — Z7189 Other specified counseling: Secondary | ICD-10-CM | POA: Diagnosis not present

## 2020-06-10 DIAGNOSIS — R0600 Dyspnea, unspecified: Secondary | ICD-10-CM

## 2020-06-10 DIAGNOSIS — A419 Sepsis, unspecified organism: Secondary | ICD-10-CM | POA: Diagnosis not present

## 2020-06-10 DIAGNOSIS — J9601 Acute respiratory failure with hypoxia: Secondary | ICD-10-CM | POA: Diagnosis not present

## 2020-06-10 DIAGNOSIS — C799 Secondary malignant neoplasm of unspecified site: Secondary | ICD-10-CM | POA: Diagnosis not present

## 2020-06-10 LAB — CBC WITH DIFFERENTIAL/PLATELET
Abs Immature Granulocytes: 0.13 10*3/uL — ABNORMAL HIGH (ref 0.00–0.07)
Basophils Absolute: 0 10*3/uL (ref 0.0–0.1)
Basophils Relative: 0 %
Eosinophils Absolute: 0 10*3/uL (ref 0.0–0.5)
Eosinophils Relative: 0 %
HCT: 30 % — ABNORMAL LOW (ref 39.0–52.0)
Hemoglobin: 9.3 g/dL — ABNORMAL LOW (ref 13.0–17.0)
Immature Granulocytes: 1 %
Lymphocytes Relative: 3 %
Lymphs Abs: 0.6 10*3/uL — ABNORMAL LOW (ref 0.7–4.0)
MCH: 33.3 pg (ref 26.0–34.0)
MCHC: 31 g/dL (ref 30.0–36.0)
MCV: 107.5 fL — ABNORMAL HIGH (ref 80.0–100.0)
Monocytes Absolute: 0.3 10*3/uL (ref 0.1–1.0)
Monocytes Relative: 1 %
Neutro Abs: 19.4 10*3/uL — ABNORMAL HIGH (ref 1.7–7.7)
Neutrophils Relative %: 95 %
Platelets: 215 10*3/uL (ref 150–400)
RBC: 2.79 MIL/uL — ABNORMAL LOW (ref 4.22–5.81)
RDW: 17.1 % — ABNORMAL HIGH (ref 11.5–15.5)
WBC: 20.4 10*3/uL — ABNORMAL HIGH (ref 4.0–10.5)
nRBC: 0.1 % (ref 0.0–0.2)

## 2020-06-10 LAB — BASIC METABOLIC PANEL
Anion gap: 12 (ref 5–15)
BUN: 36 mg/dL — ABNORMAL HIGH (ref 8–23)
CO2: 30 mmol/L (ref 22–32)
Calcium: 8.8 mg/dL — ABNORMAL LOW (ref 8.9–10.3)
Chloride: 111 mmol/L (ref 98–111)
Creatinine, Ser: 0.56 mg/dL — ABNORMAL LOW (ref 0.61–1.24)
GFR calc Af Amer: >60 mL/min (ref >60)
GFR calc non Af Amer: >60 mL/min (ref >60)
Glucose, Bld: 163 mg/dL — ABNORMAL HIGH (ref 70–99)
Potassium: 3.1 mmol/L — ABNORMAL LOW (ref 3.5–5.1)
Sodium: 153 mmol/L — ABNORMAL HIGH (ref 135–145)

## 2020-06-10 LAB — MAGNESIUM: Magnesium: 2.2 mg/dL (ref 1.7–2.4)

## 2020-06-10 MED ORDER — LORAZEPAM 2 MG/ML IJ SOLN: 0.5000 mg | Freq: Four times a day (QID) | INTRAMUSCULAR | Status: DC | PRN

## 2020-06-10 MED ORDER — GLYCOPYRROLATE 0.2 MG/ML IJ SOLN
0.2000 mg | INTRAMUSCULAR | Status: DC | PRN
  Filled 2020-06-10: qty 1

## 2020-06-10 MED ORDER — MORPHINE SULFATE (CONCENTRATE) 10 MG/0.5ML PO SOLN
5.0000 mg | ORAL | Status: DC | PRN
  Administered 2020-06-10 (×2): 5 mg via SUBLINGUAL
  Filled 2020-06-10 (×2): qty 0.5

## 2020-06-10 MED ORDER — MORPHINE SULFATE (PF) 2 MG/ML IV SOLN: 1.0000 mg | INTRAVENOUS | Status: DC | PRN

## 2020-06-10 MED ORDER — POTASSIUM CHLORIDE CRYS ER 20 MEQ PO TBCR: 40.0000 meq | EXTENDED_RELEASE_TABLET | Freq: Once | ORAL | Status: DC

## 2020-06-10 MED ORDER — POTASSIUM CHLORIDE 10 MEQ/100ML IV SOLN
10.0000 meq | INTRAVENOUS | Status: DC
  Filled 2020-06-10: qty 100

## 2020-06-10 NOTE — Progress Notes (Signed)
Daily Progress Note   Patient Name: Peter Wood       Date: 06/10/2020 DOB: 1935/03/17  Age: 85 y.o. MRN#: [id number] Attending Physician: Dwyane Dee, MD Primary Care Physician: Cassandria Anger, MD Admit Date: 06/05/2020  Reason for Consultation/Follow-up: Establishing goals of care and Terminal Care  Subjective: Patient wakes to voice. He appear very weak and gasping for breath despite HFNC and NRB mask. Patient denies pain. He does report he feels short of breath and when asked if he feels like he is struggling he states "a little bit." Explained concern with respiratory failure secondary to metastatic cancer and poor improvement despite antibiotics and oxygen. Explained recommendation for comfort and adding comfort meds to ensure he does not suffer. Patient is willing to try morphine for shortness of breath. Reassured patient I would discuss with his wife when at bedside.    GOC:  F/u GOC with patient's wife in person and daughter Claiborne Billings) and grandson Liane Comber) via telephone. Expressed concern with ongoing respiratory failure and poor improvement despite aggressive oxygen and antibiotics. Updated them on my conversation with Kalijah this morning, sharing he appears more uncomfortable today with shortness of breath and gasping for breath. Explained that Jaylene feels like he is short of breath and struggling this morning. Frankly and compassionately discussed poor prognosis, instability of transfer out of hospital, and anticipation that he will pass away in the hospital. Explained life-prolonging nature of HFNC and NRB mask.  Educated on medical recommendation for transition to comfort measures to ensure he does not struggle as he nears EOL and also to allow unrestricted visitor access.  Wife, daughter, and grandson understand and agree with transition to comfort focused pathway, sharing they do not wish to see him struggle. Discussed discontinuation of medications and interventions not aimed at comfort. Discussed emphasis on symptom management medications to ensure comfort and relief from suffering. There is more family coming from out of state today. Family does not wish to start weaning oxygen yet as they wish to allow all family members to visit as he nears EOL. Discuss comfort feeds, medications, unrestricted visitor access. Answered all questions and concerns. Prepared family for 'anything to happen at any time' and that he could pass even with NRB mask and HFNC. PMT contact information given.   Updated RN and Dr. Sabino Gasser.  Shortly after, received call from grandson, [place]. Answered further questions regarding diagnoses, interventions, comfort care pathway, medications, and poor prognosis. He acknowledges that the oxygen his grandfather is receiving is essentially "life support" at this time. Encouraged weaning oxygen once family visits and as long as he appears comfortable with medications. [place] appreciative of information and understands he can call with questions this afternoon.    Length of Stay: 3  Current Medications: Scheduled Meds:  . apixaban  2.5 mg Oral BID  . Chlorhexidine Gluconate Cloth  6 each Topical Daily  . latanoprost  1 drop Both Eyes QHS  . mouth rinse  15 mL Mouth Rinse BID  . methylPREDNISolone (SOLU-MEDROL) injection  40 mg Intravenous Q12H    Continuous Infusions: . sodium chloride 10 mL/hr at 06/08/20 1309  . ceFEPime (MAXIPIME) IV 2 g (06/09/20 2109)  . potassium chloride      PRN Meds: sodium chloride, acetaminophen **OR** acetaminophen, HYDROcodone-acetaminophen, ondansetron **OR** ondansetron (ZOFRAN) IV, sodium chloride flush  Physical Exam Vitals and nursing note reviewed.  Constitutional:      Appearance: He is cachectic. He is  ill-appearing.  HENT:     Head: Normocephalic and atraumatic.  Pulmonary:     Effort: Accessory muscle usage present. No respiratory distress.     Comments: NRB and HFNC, gasping. Appears slightly uncomfortable. Skin:    General: Skin is warm and dry.  Neurological:     Mental Status: He is easily aroused.     Comments: Awake, confused, pleasant            Vital Signs: BP (!) 142/113 (BP Location: Right Arm)   Pulse (!) 106   Temp (!) 97.4 F (36.3 C) (Oral)   Resp 18   Ht 5\' 4"  (1.626 m)   Wt 61.2 kg   SpO2 97%   BMI 23.17 kg/m  SpO2: SpO2: 97 % O2 Device: O2 Device: High Flow Nasal Cannula, Partial Rebreather Mask O2 Flow Rate: O2 Flow Rate (L/min): 20 L/min  Intake/output summary:   Intake/Output Summary (Last 24 hours) at 06/10/2020 0917 Last data filed at 06/09/2020 1600 Gross per 24 hour  Intake 100 ml  Output 680 ml  Net -580 ml   LBM: Last BM Date: 06/03/20 Baseline Weight: Weight: 61.2 kg Most recent weight: Weight: 61.2 kg       Palliative Assessment/Data: PPS 20%      Patient Active Problem List   Diagnosis Date Noted  . Protein-calorie malnutrition, severe ([place]) 06/08/2020  . Macrocytic anemia 06/08/2020  . Hypernatremia 06/08/2020  . Severe sepsis ([place]) 05/13/2020  . Advanced care planning/counseling discussion   . Palliative care by specialist   . Acute respiratory failure with hypoxia ([place]) 05/27/2020  . Chronic diastolic CHF (congestive heart failure) ([place]) 05/27/2020  . Community acquired pneumonia 05/26/2020  . Hypoalbuminemia 05/26/2020  . Abnormal LFTs 05/26/2020  . Iron deficiency anemia due to chronic blood loss 12/26/2019  . Port-A-Cath in place 12/18/2018  . Anemia 11/12/2018  . Anemia due to antineoplastic chemotherapy 11/06/2018  . Genetic testing 10/14/2018  . Metastatic malignant neoplasm ([place]) 10/04/2018  . Gastric cancer ([place]) 10/04/2018  . Family history of prostate cancer   . Family history of breast cancer   . Aortic  atherosclerosis ([place]) 10/03/2018  . Acute pulmonary embolus ([place]) 09/20/2018  . Pulmonary embolism ([place]) 09/19/2018  . Liver metastases ([place]) 09/19/2018  . Abdominal neoplasm without bowel obstruction 09/19/2018  . Nausea 09/19/2018  . Abdominal pain 09/19/2018  . Upper respiratory  infection 08/01/2018  . Varicose vein of leg 07/22/2018  . Weight loss 01/18/2018  . Claudication ([place]) 01/16/2017  . Macular hole, right 09/19/2016  . Macular hole, right eye 09/19/2016  . Right sciatic nerve pain 07/19/2016  . Finger mass, right 06/24/2015  . Prostate cancer ([place]) 09/16/2013  . Rash and nonspecific skin eruption 02/12/2013  . Goals of care, counseling/discussion 09/01/2011  . TOBACCO USE, QUIT 08/25/2009  . ERECTILE DYSFUNCTION 03/03/2009  . PSA, INCREASED 03/03/2009  . Essential hypertension 06/10/2007  . DIVERTICULOSIS, COLON 06/10/2007  . COLONIC POLYPS, HX OF 06/10/2007    Palliative Care Assessment & Plan   Patient Profile: Briefly, Mr. Venne is an 85 year old male with past medical history of prostate cancer status post XRT, gastric metastatic adenocarcinoma diagnosed in January 2020 status post chemotherapy, recent admission with respiratory failure thought to be most likely pneumonitis related to treatment versus (more likely) lymphangitic spread.  He was discharged home with hospice services yesterday but presented to the emergency room after he developed fever at home and was sleepier and family called EMS.  Palliative consulted for goals of care.  Assessment: Metastatic gastric cancer, likely lymphangitic spread Acute respiratory failure Multifocal pneumonia Persistent loculated RU pleural effusion Sepsis Chronic diastolic CHF Severe protein calorie malnutrition  Recommendations/Plan:  After further GOC discussion with wife, daughter, and grandson, family agrees with medical recommendation for transition to comfort measures only to ensure Mr. Mariani does not  struggle as he nears EOL.  Comfort meds added to [facility].  Comfort feeds per patient/family request.  Unrestricted visitor access.  Spiritual care consult for EOL care and prayer.  Continue current oxygen requirements as family is coming from out of state today. Educated on life-prolonging nature of NRB and HFNC and encouraged family consider weaning once other family arrives and as long as patient is comfortable with comfort medications.   PMT provider will continue to follow. Anticipate hospital demise. Unstable for transfer to hospice facility.    Code Status: DNR   Code Status Orders  (From admission, onward)         Start     Ordered   06/03/2020 2307  Do not attempt resuscitation (DNR)  Continuous       Question Answer Comment  In the event of cardiac or respiratory ARREST Do not call a "code blue"   In the event of cardiac or respiratory ARREST Do not perform Intubation, CPR, defibrillation or ACLS   In the event of cardiac or respiratory ARREST Use medication by any route, position, wound care, and other measures to relive pain and suffering. May use oxygen, suction and manual treatment of airway obstruction as needed for comfort.      06/06/2020 2306        Code Status History    Date Active Date Inactive Code Status Order ID Comments User Context   05/23/2020 0947 06/01/2020 2306 DNR [id number]  Dorie Rank, MD ED   05/30/2020 1059 06/06/2020 1741 DNR [id number]  Marshell Garfinkel, MD Inpatient   05/26/2020 0109 05/30/2020 1059 Full Code [id number]  Mikki Harbor, MD ED   09/19/2018 1718 09/25/2018 1751 Full Code [id number]  Janora Norlander, MD ED   09/19/2016 1634 09/20/2016 1206 Full Code [id number]  [name], MD Inpatient   Advance Care Planning Activity       Prognosis:  Poor prognosis: hours-days especially once oxygen is weaned.  Discharge Planning:  Anticipated Hospital Death  Care plan was discussed with   Thank you for  allowing the Palliative Medicine  Team to assist in the care of this patient.   Total Time 60 Prolonged Time Billed  no    Greater than 50% of this time was spent counseling and coordinating care related to the above assessment and plan.   [name], DNP, FNP-C Palliative Medicine Team  Phone: [phone] Fax: [phone]  Please contact Palliative Medicine Team phone at [phone] for questions and concerns.

## 2020-06-10 NOTE — Plan of Care (Signed)
  Problem: Clinical Measurements: Goal: Ability to maintain clinical measurements within normal limits will improve Outcome: Not Progressing Goal: Respiratory complications will improve Outcome: Not Progressing   Problem: Activity: Goal: Risk for activity intolerance will decrease Outcome: Not Progressing

## 2020-06-10 NOTE — Progress Notes (Signed)
Chaplain rec'd spiritual consult.  Patient in comfort care. Wife and daughter bedside.  Patient and wife been married 31 years, 4 children, 3 son and daughter. They are [place].  Wife said "he wanted to come home and we got him home" but then realized he needed to come back to hospital. She said they were pleased with the support and care by staff that they have rec'd. Chaplain offered prayer. Rev. Tamsen Snider Pager [phone]

## 2020-06-10 NOTE — Progress Notes (Signed)
PROGRESS NOTE    Peter Wood   EAV:[id number]  DOB: 1934/10/11  DOA: 06/08/2020     3  PCP: Cassandria Anger, MD  CC: Shortness of breath  Hospital Course: Mr. Kaspar is an 85 yo male with PMH stage IV gastric adenocarcinoma who presented with SOB at home. He follows closely with oncology and has been declining rapidly recently. He has had outpatient antibiotics recently with minimal improvement and now presents with worsening SOB/dyspnea. There is concern for lymphangitic spread from his cancer.  Of note, he was just hospitalized 9/14 - 9/25 for pneumonia and hypoxic respiratory failure.  He was discharged home with intentions for pursuing hospice and comfort/end-of-life care.  Upon returning to the hospital again for this hospitalization, there was significant hesitation from the patient and family regarding the current goals of care.  He does remain a DNR/DNI however the morning following admission, he wished to hold off on transitioning to hospice and wanted to pursue antibiotics and ongoing medical care.  He was started on vancomycin and cefepime on admission.  Multiple discussions were held bedside with patient and family regarding that aggressive actions would likely be deemed futile and that the patient would progressively decline given his overall frail state.  Nevertheless, the patient did voice wishes to continue antibiotics and hold off on hospice on the morning of 06/09/2020.  He did however express understanding that if he did not improve, he knows he would decline and approach end of life.   On 06/10/2020 palliative care met with family and patient at bedside. He had continued to decline and showed no improvement with ongoing aggressive medical measures. After discussion decision was made for pursuing comfort level care and de-escalating aggressive measures in place.   Interval History:  Patient less alert this morning and appears to be a little more uncomfortable  with his breathing pattern. Still using some accessory muscles and remains on heated high flow with nonrebreather on top. Later in the morning palliative care met with patient and family bedside. After discussion, they have elected for de-escalating care and transitioning to comfort care/end-of-life.  Old records reviewed in assessment of this patient  ROS: Constitutional: positive for fatigue and malaise, Respiratory: positive for Shortness of breath, Cardiovascular: negative for chest pain and Gastrointestinal: negative for abdominal pain  Assessment & Plan: * Severe sepsis (HCC) - tachycardia, tachypnea, leukocytosis, lactic 2.3 on admission; source presumed pulmonary - given ongoing physical decline with poor prognosis, patient and family were considering transition to hospice however as of morning of 9/29, seems that everyone now wishes to remain "aggressive" - patient was continued on HFNC+NRB; remained DNR/I - solu-medrol started on 9/29 as well - by 9/30 patient continued to decline with wrosening renal function, hypernatremia and decreasing level of alertness; family met with PC and decision was made to transfer to comfort care in efforts of pursuing end of life care - abx, steroids, and non-essential meds were discontinued - patient too unstable for transfer out of hospital at this time   Hypernatremia -Considered due to poor oral intake - mediocre intake since admit per family bedside; Na worse today - giving IVF will inevitably worsen respiratory status/comfort, therefore hold off on IVF/free water  Goals of care, counseling/discussion -Patient and family changed goals of care during hospitalization. We pursued aggressive care after multiple discussions, however by 06/10/2020 after further discussion with palliative care, family has elected to pursue comfort care once again. Patient is continuing to decline and not responding to  treatment. Oncology has also evaluated the patient  and agrees with pursuing hospice care -Nonessential medications have been discontinued -Current level of oxygen is being continued for now while further family is arriving from out of town then will likely de-escalate  Metastatic malignant neoplasm [facility]) -Appreciate oncology input.  Hospice recommended by oncology and I would also agree; family now electing for comfort care on 9/30 (prior to this has been more aggressive measures)  Macrocytic anemia -Baseline hemoglobin 8 to 9 g/dL -Continue monitoring.  Remains at baseline  Protein-calorie malnutrition, severe (HCC) -Due to underlying malignancy -Overall prognosis remains poor  Chronic diastolic CHF (congestive heart failure) (HCC) - d/c IVF - monitor fluid status    Antimicrobials: Azithromycin 06/08/2020 x1 Rocephin 06/04/2020 x 1 Cefepime 06/08/2020>> 06/10/2020 Vancomycin 06/10/2020>> 9/29  DVT prophylaxis: None as end-of-life care now being pursued Code Status: DNR Family Communication: Wife and daughter Disposition Plan: Status is: Inpatient  Remains inpatient appropriate because:IV treatments appropriate due to intensity of illness or inability to take PO, Inpatient level of care appropriate due to severity of illness and Approaching end-of-life   Dispo: The patient is from: Home              Anticipated d/c is to: In-hospital comfort care/end-of-life care              Anticipated d/c date is:               Patient currently is not medically stable to d/c.  Objective: Blood pressure (!) 142/113, pulse (!) 106, temperature (!) 97.4 F (36.3 C), temperature source Oral, resp. rate 18, height 5\' 4"  (1.626 m), weight 61.2 kg, SpO2 97 %.  Examination: General appearance: Chronically ill-appearing and cachectic elderly man laying in bed in no distress but appears uncomfortable Head: Normocephalic, without obvious abnormality, atraumatic Eyes: EOMI Lungs: Coarse breath sounds bilaterally throughout Heart: regular rate and  rhythm and S1, S2 normal Abdomen: normal findings: bowel sounds normal and soft, non-tender Extremities: Thin, no edema Skin: mobility and turgor normal Neurologic: No focal deficits  Consultants:   Oncology  Palliative care  Procedures:   None  Data Reviewed: I have personally reviewed following labs and imaging studies Results for orders placed or performed during the hospital encounter of 05/18/2020 (from the past 24 hour(s))  Basic metabolic panel     Status: Abnormal   Collection Time: 06/10/20  3:30 AM  Result Value Ref Range   Sodium 153 (H) 135 - 145 mmol/L   Potassium 3.1 (L) 3.5 - 5.1 mmol/L   Chloride 111 98 - 111 mmol/L   CO2 30 22 - 32 mmol/L   Glucose, Bld 163 (H) 70 - 99 mg/dL   BUN 36 (H) 8 - 23 mg/dL   Creatinine, Ser 06/12/20 (L) 0.61 - 1.24 mg/dL   Calcium 8.8 (L) 8.9 - 10.3 mg/dL   GFR calc non Af Amer >60 >60 mL/min   GFR calc Af Amer >60 >60 mL/min   Anion gap 12 5 - 15  CBC with Differential/Platelet     Status: Abnormal   Collection Time: 06/10/20  3:30 AM  Result Value Ref Range   WBC 20.4 (H) 4.0 - 10.5 K/uL   RBC 2.79 (L) 4.22 - 5.81 MIL/uL   Hemoglobin 9.3 (L) 13.0 - 17.0 g/dL   HCT 06/12/20 (L) 39 - 52 %   MCV 107.5 (H) 80.0 - 100.0 fL   MCH 33.3 26.0 - 34.0 pg   MCHC 31.0 30.0 - 36.0  g/dL   RDW 17.1 (H) 11.5 - 15.5 %   Platelets 215 150 - 400 K/uL   nRBC 0.1 0.0 - 0.2 %   Neutrophils Relative % 95 %   Neutro Abs 19.4 (H) 1.7 - 7.7 K/uL   Lymphocytes Relative 3 %   Lymphs Abs 0.6 (L) 0.7 - 4.0 K/uL   Monocytes Relative 1 %   Monocytes Absolute 0.3 0 - 1 K/uL   Eosinophils Relative 0 %   Eosinophils Absolute 0.0 0 - 0 K/uL   Basophils Relative 0 %   Basophils Absolute 0.0 0 - 0 K/uL   Immature Granulocytes 1 %   Abs Immature Granulocytes 0.13 (H) 0.00 - 0.07 K/uL  Magnesium     Status: None   Collection Time: 06/10/20  3:30 AM  Result Value Ref Range   Magnesium 2.2 1.7 - 2.4 mg/dL    Recent Results (from the past 240 hour(s))    Respiratory Panel by RT PCR (Flu A&B, Covid) - Nasopharyngeal Swab     Status: None   Collection Time: 06/06/2020 10:02 AM   Specimen: Nasopharyngeal Swab  Result Value Ref Range Status   SARS Coronavirus 2 by RT PCR NEGATIVE NEGATIVE Final    Comment: (NOTE) SARS-CoV-2 target nucleic acids are NOT DETECTED.  The SARS-CoV-2 RNA is generally detectable in upper respiratoy specimens during the acute phase of infection. The lowest concentration of SARS-CoV-2 viral copies this assay can detect is 131 copies/mL. A negative result does not preclude SARS-Cov-2 infection and should not be used as the sole basis for treatment or other patient management decisions. A negative result may occur with  improper specimen collection/handling, submission of specimen other than nasopharyngeal swab, presence of viral mutation(s) within the areas targeted by this assay, and inadequate number of viral copies (<131 copies/mL). A negative result must be combined with clinical observations, patient history, and epidemiological information. The expected result is Negative.  Fact Sheet for Patients:  PinkCheek.be  Fact Sheet for Healthcare Providers:  GravelBags.it  This test is no t yet approved or cleared by the [place] FDA and  has been authorized for detection and/or diagnosis of SARS-CoV-2 by FDA under an Emergency Use Authorization (EUA). This EUA will remain  in effect (meaning this test can be used) for the duration of the COVID-19 declaration under Section 564(b)(1) of the Act, 21 U.S.C. section 360bbb-3(b)(1), unless the authorization is terminated or revoked sooner.     Influenza A by PCR NEGATIVE NEGATIVE Final   Influenza B by PCR NEGATIVE NEGATIVE Final    Comment: (NOTE) The Xpert Xpress SARS-CoV-2/FLU/RSV assay is intended as an aid in  the diagnosis of influenza from Nasopharyngeal swab specimens and  should not be used as  a sole basis for treatment. Nasal washings and  aspirates are unacceptable for Xpert Xpress SARS-CoV-2/FLU/RSV  testing.  Fact Sheet for Patients: PinkCheek.be  Fact Sheet for Healthcare Providers: GravelBags.it  This test is not yet approved or cleared by the [place] FDA and  has been authorized for detection and/or diagnosis of SARS-CoV-2 by  FDA under an Emergency Use Authorization (EUA). This EUA will remain  in effect (meaning this test can be used) for the duration of the  Covid-19 declaration under Section 564(b)(1) of the Act, 21  U.S.C. section 360bbb-3(b)(1), unless the authorization is  terminated or revoked. Performed at [facility], [place] [address], [place], [place] [address]   Blood culture (routine single)     Status:  None (Preliminary result)   Collection Time: 05/29/2020 10:06 AM   Specimen: BLOOD  Result Value Ref Range Status   Specimen Description   Final    BLOOD BLOOD RIGHT FOREARM Performed at [facility] [address], [place], [place] [address]    Special Requests   Final    BOTTLES DRAWN AEROBIC ONLY Blood Culture results may not be optimal due to an inadequate volume of blood received in culture bottles Performed at [facility] [address], [place], [place] [address]    Culture   Final    NO GROWTH 3 DAYS Performed at [facility], [place] [address] [place], [place] [address]    Report Status PENDING  Incomplete  Culture, blood (single)     Status: None (Preliminary result)   Collection Time: 05/21/2020 12:31 PM   Specimen: BLOOD  Result Value Ref Range Status   Specimen Description   Final    BLOOD BLOOD RIGHT HAND Performed at [facility] [address], [place], [place] [address]    Special Requests   Final    BOTTLES DRAWN AEROBIC ONLY Blood Culture results may not be optimal due to an  inadequate volume of blood received in culture bottles Performed at [facility] [address] [place], [place] [address]    Culture   Final    NO GROWTH 3 DAYS Performed at [facility], [place] [address] [place], [place] [address]    Report Status PENDING  Incomplete  Urine culture     Status: None   Collection Time: 06/08/20 12:47 AM   Specimen: In/Out Cath Urine  Result Value Ref Range Status   Specimen Description   Final    IN/OUT CATH URINE Performed at [facility] [address], [place], [place] [address]    Special Requests   Final    NONE Performed at [facility], [place] [address], [place], [place] [address]    Culture   Final    NO GROWTH Performed at [facility], [place] [address], [place], [place] [address]    Report Status 06/08/2020 FINAL  Final  MRSA PCR Screening     Status: None   Collection Time: 06/08/20  1:30 PM   Specimen: Nasopharyngeal  Result Value Ref Range Status   MRSA by PCR NEGATIVE NEGATIVE Final    Comment:        The GeneXpert MRSA Assay (FDA approved for NASAL specimens only), is one component of a comprehensive MRSA colonization surveillance program. It is not intended to diagnose MRSA infection nor to guide or monitor treatment for MRSA infections. Performed at [facility], [place] [address], [place], [place] [address]      Radiology Studies: DG CHEST PORT 1 VIEW  Result Date: 06/09/2020 CLINICAL DATA:  Hypoxia EXAM: PORTABLE CHEST 1 VIEW COMPARISON:  June 07, 2020 FINDINGS: Port-A-Cath tip is in the superior vena cava. No pneumothorax. Widespread airspace opacity persists without appreciable change. Apparent loculated fluid right lateral upper chest and apical regions is stable. No new opacity. Heart is upper normal in size with pulmonary vascularity normal. No appreciable adenopathy. No appreciable bone lesions. IMPRESSION: Stable Port-A-Cath  position without pneumothorax. Widespread airspace opacity bilaterally, essentially stable. Loculated fluid right upper chest region, stable. No new opacity. Stable cardiac silhouette. Suspect multifocal pneumonia. Advise correlation with COVID-19 status given this appearance. Electronically Signed   By: Gwyndolyn Saxon  Jasmine December III M.D.   On: 06/09/2020 09:59   DG CHEST PORT 1 VIEW  Final Result    DG Chest Port 1 View  Final Result      Scheduled Meds: . latanoprost  1 drop Both Eyes QHS  . mouth rinse  15 mL Mouth Rinse BID   PRN Meds: sodium chloride, acetaminophen **OR** acetaminophen, glycopyrrolate, LORazepam, morphine injection, morphine CONCENTRATE, ondansetron **OR** ondansetron (ZOFRAN) IV, sodium chloride flush Continuous Infusions: . sodium chloride 10 mL/hr at 06/08/20 1309      LOS: 3 days  Time spent: Greater than 50% of the 35 minute visit was spent in counseling/coordination of care for the patient as laid out in the A&P.   Dwyane Dee, MD [facility] 06/10/2020, 2:58 PM  Contact via secure chat.  To contact the attending provider between 7A-7P or the covering provider during after hours 7P-7A, please log into the web site [email or web address] and access using universal [place] password for that web site. If you do not have the password, please call the hospital operator.

## 2020-06-10 NOTE — Progress Notes (Signed)
[facility]) hospitalized hospice patient    Jontue Crumpacker is a newly admitted home hospice patient (on 9/26) with a terminal diagnosis of gastric cancer.  Family activated EMS when patient developed fever of 103F and decreased SpO2 level. Family contacted hospice after calling EMS. Patient was admitted to [facility] on 06/06/2020 with a diagnosis of Sepsis.  Per Dr. Tomasa Hosteller with [facility] this is a related hospital admission.   Visited at bedside, wife and daughter present. Patient wakes to voice, is very weak and gasping for breath despite HFNC and NRB mask. He denies pain but does say he is having a hard time breathing.  Palliative team has visited also. They have recommended going to full comfort care and weaning oxygen but family does not want to start weaning until all family members are able to visit. Provided support to family and patient.   VS: 97.4, 142/113, 106, 18, 97% on HFNC and NRB mask.  I/O: 100/680  Abnormal labs:  Results for MARTEL, GALVAN (MRN [id number]) as of 06/10/2020 15:05 Sodium: 153 (H) Potassium: 3.1 (L) Glucose: 163 (H) BUN: 36 (H) Creatinine: 0.56 (L) Calcium: 8.8 (L)  WBC: 20.4 (H) RBC: 2.79 (L) Hemoglobin: 9.3 (L) HCT: 30.0 (L) MCV: 107.5 (H) RDW: 17.1 (H)  NEUT#: 19.4 (H) Lymphocyte #: 0.6 (L) Abs Immature Granulocytes: 0.13 (H)  Problem list  Assessment & Plan: * Severe sepsis (HCC) - given ongoing physical decline with poor prognosis, patient and family were considering transition to hospice however as of morning of 9/29, seems that everyone now wishes to remain "aggressive" - patient was continued on HFNC+NRB; remained DNR/I - solu-medrol started on 9/29 as well - by 9/30 patient continued to decline with worsening renal function, hypernatremia and decreasing level of alertness; family met with PC and decision was made to transfer to comfort care in efforts of pursuing end of life care - abx, steroids, and non-essential  meds were discontinued - patient too unstable for transfer out of hospital at this time   GOC: Family is agreeable to transitioning to total comfort care.  D/C Planning:  hospital death anticipated, not stable for transfer to [place]: Spoke with wife and daughter at bedside.  IDT: hospice team updated   Farrel Gordon, RN, [facility] (listed on AMION under [place])   [phone]

## 2020-06-10 NOTE — TOC Progression Note (Signed)
Transition of Care [facility]) - Progression Note    Patient Details  Name: Peter Wood MRN: [id number] Date of Birth: [birth year]  Transition of Care [facility]) CM/SW Contact  Sneha Willig, Juliann Pulse, RN Phone Number: 06/10/2020, 12:19 PM  Clinical Narrative: Noted anticipate hospital death-too unsafe to transfer to residential hospice.      Expected Discharge Plan:  [facility] death) Barriers to Discharge: Other (comment) [facility] death)  Expected Discharge Plan and Services Expected Discharge Plan:  [facility] death) Discharge Planning Services: CM Consult  Living arrangements for the past 2 months: [place] Expected Discharge Date:  (unknown)                                    Social Determinants of Health (SDOH) Interventions    Readmission Risk Interventions No flowsheet data found.

## 2020-06-11 ENCOUNTER — Encounter: Payer: Self-pay | Admitting: Internal Medicine

## 2020-06-11 DIAGNOSIS — E43 Unspecified severe protein-calorie malnutrition: Secondary | ICD-10-CM | POA: Diagnosis not present

## 2020-06-11 DIAGNOSIS — A419 Sepsis, unspecified organism: Secondary | ICD-10-CM | POA: Diagnosis not present

## 2020-06-11 DIAGNOSIS — I5032 Chronic diastolic (congestive) heart failure: Secondary | ICD-10-CM | POA: Diagnosis not present

## 2020-06-11 DIAGNOSIS — J9601 Acute respiratory failure with hypoxia: Secondary | ICD-10-CM | POA: Diagnosis not present

## 2020-06-11 DIAGNOSIS — C169 Malignant neoplasm of stomach, unspecified: Secondary | ICD-10-CM | POA: Diagnosis not present

## 2020-06-11 DIAGNOSIS — C799 Secondary malignant neoplasm of unspecified site: Secondary | ICD-10-CM | POA: Diagnosis not present

## 2020-06-11 MED ORDER — MORPHINE SULFATE (PF) 2 MG/ML IV SOLN
1.0000 mg | INTRAVENOUS | Status: DC
  Administered 2020-06-11: 1 mg via INTRAVENOUS
  Filled 2020-06-11: qty 1

## 2020-06-11 DEATH — deceased

## 2020-06-12 LAB — CULTURE, BLOOD (SINGLE)
Culture: NO GROWTH
Culture: NO GROWTH

## 2020-06-23 ENCOUNTER — Ambulatory Visit: Payer: Medicare PPO | Admitting: Adult Health

## 2020-06-23 ENCOUNTER — Ambulatory Visit: Payer: Medicare PPO

## 2020-06-23 ENCOUNTER — Other Ambulatory Visit: Payer: Medicare PPO

## 2020-06-29 LAB — FUNGUS CULTURE RESULT

## 2020-06-29 LAB — FUNGUS CULTURE WITH STAIN

## 2020-06-29 LAB — FUNGAL ORGANISM REFLEX

## 2020-07-10 LAB — ACID FAST CULTURE WITH REFLEXED SENSITIVITIES (MYCOBACTERIA)
Acid Fast Culture: NEGATIVE
Acid Fast Culture: NEGATIVE

## 2020-07-12 NOTE — Discharge Summary (Signed)
Death Summary  Peter Wood:[id number] DOB: 1935/07/22 DOA: 10-Jun-2020  PCP: Cassandria Anger, MD  Admit date: 2020/06/10 Date of Death: Jun 14, 2020 Time of Death: 0949 Notification: Plotnikov, Evie Lacks, MD notified of death of Jun 14, 2020   History of present illness:  Peter Wood is an 85 yo male with PMH stage IV gastric adenocarcinoma who presented with SOB at home. He follows closely with oncology and has been declining rapidly recently. He has had outpatient antibiotics recently with minimal improvement and now presents with worsening SOB/dyspnea. There is concern for lymphangitic spread from his cancer.  Of note, he was just hospitalized 9/14 - 9/25 for pneumonia and hypoxic respiratory failure.  He was discharged home with intentions for pursuing hospice and comfort/end-of-life care.  Upon returning to the hospital again for this hospitalization, there was significant hesitation from the patient and family regarding the current goals of care.  He does remain a DNR/DNI however the morning following admission, he wished to hold off on transitioning to hospice and wanted to pursue antibiotics and ongoing medical care.  He was started on vancomycin and cefepime on admission.  Multiple discussions were held bedside with patient and family regarding that aggressive actions would likely be deemed futile and that the patient would progressively decline given his overall frail state.  Nevertheless, the patient did voice wishes to continue antibiotics and hold off on hospice on the morning of 06/09/2020.  He did however express understanding that if he did not improve, he knows he would decline and approach end of life.   On 06/10/2020 palliative care met with family and patient at bedside. He had continued to decline and showed no improvement with ongoing aggressive medical measures. After discussion decision was made for pursuing comfort level care and de-escalating aggressive measures in  place. Patient passed naturally on June 14, 2020 at 0949.    Final Diagnoses:  Acute hypoxic respiratory failure Severe sepsis 2/2 CAP Severe protein calorie malnutrition Stage IV gastric adenocarcinoma   The results of significant diagnostics from this hospitalization (including imaging, microbiology, ancillary and laboratory) are listed below for reference.    Significant Diagnostic Studies: DG Chest 2 View  Result Date: 05/25/2020 CLINICAL DATA:  Cough and fever.  Reported known prostate carcinoma EXAM: CHEST - 2 VIEW COMPARISON:  April 28, 2020 FINDINGS: There is widespread multifocal airspace opacity throughout the lungs bilaterally, most notably in the right upper lobe. There is an equivocal right pleural effusion. Heart is upper normal in size with pulmonary vascularity normal. Port-A-Cath tip is in the superior vena cava. No appreciable adenopathy. No blastic or lytic bone lesions are appreciable by radiography. IMPRESSION: Widespread airspace opacity bilaterally, more severe on the right than on the left. Suspect atypical organism pneumonia given this overall appearance. Lymphangitic spread of tumor conceivably could present in this manner but is felt to be less likely given this overall appearance. No well-defined nodular opacities noted within the lungs. Port-A-Cath tip in superior vena cava. No adenopathy evident by radiography. Heart upper normal in size. These results will be called to the ordering clinician or representative by the Radiologist Assistant, and communication documented in the PACS or [employer]. Electronically Signed   By: Lowella Grip III M.D.   On: 05/25/2020 15:29   CT CHEST WO CONTRAST  Result Date: 05/30/2020 CLINICAL DATA:  Inpatient. Respiratory failure. Worsening dyspnea. History of metastatic gastric cancer and prostate cancer. EXAM: CT CHEST WITHOUT CONTRAST TECHNIQUE: Multidetector CT imaging of the chest was performed following the standard  protocol  without IV contrast. COMPARISON:  Chest radiograph from one day prior. 05/25/2020 chest CT angiogram. FINDINGS: Cardiovascular: Top-normal heart size. Moderate pericardial effusion is unchanged. Left anterior descending coronary atherosclerosis. Right internal jugular Port-A-Cath terminates at the cavoatrial junction. Atherosclerotic nonaneurysmal thoracic aorta. Normal caliber pulmonary arteries. Mediastinum/Nodes: Coarsely calcified 1.1 cm left thyroid nodule is stable. Not clinically significant; no follow-up imaging recommended (ref: J Am Coll Radiol. 2015 Feb;12(2): 143-50). Unremarkable esophagus. No axillary adenopathy. Coarsely calcified nonenlarged bilateral paratracheal, subcarinal, AP window and bilateral hilar lymph nodes compatible with prior granulomatous disease, unchanged. No pathologically enlarged noncalcified mediastinal or discrete hilar nodes on this noncontrast scan. Lungs/Pleura: No pneumothorax. Small bilateral pleural effusions, right greater than left, possibly loculated on the right, mildly increased bilaterally. There is extensive patchy consolidation and ground-glass opacity throughout both lungs involving all lung lobes, most prominent in the perihilar right lung with associated volume loss, widespread moderate bronchiectasis and architectural distortion. The consolidation and ground-glass opacity is stable to minimally improved since recent 05/25/2020 chest CT angiogram study. These findings are largely new since 09/19/2018 chest CT study. Anterior left upper lobe solid 0.9 cm pulmonary nodule (series 5/image 56) is stable since 05/25/2020 chest CT and new since 09/19/2018 chest CT. Upper abdomen: Multiple indistinct confluent hypodense liver masses, largest 6.1 cm in the right liver (series 2/image 119), not well evaluated on this noncontrast study. Musculoskeletal: No aggressive appearing focal osseous lesions. Mild thoracic spondylosis. IMPRESSION: 1. Extensive patchy consolidation  and ground-glass opacity throughout both lungs, stable to minimally improved since recent 05/25/2020 chest CT angiogram study. Widespread moderate bronchiectasis with some parenchymal distortion and volume loss. Findings are essentially new since 09/19/2018 chest CT study and suggest a severe multilobar pneumonia potentially superimposed on an underlying fibrotic interstitial lung disease. 2. Solid 0.9 cm anterior left upper lobe pulmonary nodule, stable since 05/25/2020 chest CT and new since 09/19/2018 chest CT, cannot exclude pulmonary metastasis. 3. Small bilateral pleural effusions, right greater than left, mildly increased bilaterally, potentially loculated on the right. 4. Moderate pericardial effusion, unchanged. 5. Multiple indistinct confluent hypodense liver masses, not well evaluated on this noncontrast study, compatible with known liver metastases. 6. One vessel coronary atherosclerosis. 7. Aortic Atherosclerosis (ICD10-I70.0). Electronically Signed   By: Delbert Phenix M.D.   On: 05/30/2020 17:46   CT Angio Chest PE W and/or Wo Contrast  Result Date: 05/25/2020 CLINICAL DATA:  Dyspnea, fevers, elevated D-dimer EXAM: CT ANGIOGRAPHY CHEST WITH CONTRAST TECHNIQUE: Multidetector CT imaging of the chest was performed using the standard protocol during bolus administration of intravenous contrast. Multiplanar CT image reconstructions and MIPs were obtained to evaluate the vascular anatomy. CONTRAST:  67mL OMNIPAQUE IOHEXOL 350 MG/ML SOLN COMPARISON:  None. FINDINGS: Cardiovascular: Right internal jugular chest port tip noted within the right atrium. Global cardiac size within normal limits. No significant coronary artery calcification. Small simple appearing pericardial effusion without CT evidence of cardiac tamponade. Central pulmonary arterial enlargement in keeping with changes of pulmonary artery hypertension. There is excellent opacification of the central pulmonary arterial tree without evidence of  filling defect to suggest acute pulmonary embolism. The thoracic aorta is unremarkable. Mediastinum/Nodes: No pathologic thoracic adenopathy. Lungs/Pleura: There are extensive bilateral asymmetric airspace infiltrates demonstrating an upper and mid lung zone predominance most in keeping with changes of atypical infection in the acute setting. Small right pleural effusion is present. No pneumothorax. The central airways are widely patent. Upper Abdomen: Low-attenuation lesion is seen within the left hepatic lobe, poorly characterized on this examination.  Musculoskeletal: No chest wall abnormality. No acute or significant osseous findings. Review of the MIP images confirms the above findings. IMPRESSION: 1. No evidence of acute pulmonary embolism. 2. Extensive bilateral asymmetric airspace infiltrates demonstrating an upper and mid lung zone predominance most in keeping with changes of atypical infection in the acute setting. Serologic correlation for COVID-19 pneumonia may be helpful for further evaluation. 3. Small right pleural effusion. 4. Small simple appearing pericardial effusion without CT evidence of cardiac tamponade. 5. Central pulmonary arterial enlargement in keeping with changes of pulmonary artery hypertension. Electronically Signed   By: Fidela Salisbury MD   On: 05/25/2020 22:05   DG CHEST PORT 1 VIEW  Result Date: 06/09/2020 CLINICAL DATA:  Hypoxia EXAM: PORTABLE CHEST 1 VIEW COMPARISON:  June 07, 2020 FINDINGS: Port-A-Cath tip is in the superior vena cava. No pneumothorax. Widespread airspace opacity persists without appreciable change. Apparent loculated fluid right lateral upper chest and apical regions is stable. No new opacity. Heart is upper normal in size with pulmonary vascularity normal. No appreciable adenopathy. No appreciable bone lesions. IMPRESSION: Stable Port-A-Cath position without pneumothorax. Widespread airspace opacity bilaterally, essentially stable. Loculated fluid right  upper chest region, stable. No new opacity. Stable cardiac silhouette. Suspect multifocal pneumonia. Advise correlation with COVID-19 status given this appearance. Electronically Signed   By: Lowella Grip III M.D.   On: 06/09/2020 09:59   DG Chest Port 1 View  Result Date: 06/02/2020 CLINICAL DATA:  Dyspnea, CHF, hypertension EXAM: PORTABLE CHEST 1 VIEW COMPARISON:  Portable exam 0934 hours compared to 06/01/2020 FINDINGS: RIGHT jugular Port-A-Cath unchanged tip projecting over SVC. Normal heart size, mediastinal contours, and pulmonary vascularity. Hazy BILATERAL airspace infiltrates, slightly improved in lingula. Volume loss and loculated effusion at upper RIGHT chest unchanged. No pneumothorax or acute osseous findings. IMPRESSION: Persistent loculated effusion in volume loss in the upper RIGHT chest. Hazy BILATERAL airspace infiltrates question multifocal pneumonia, slightly improved on LEFT. Electronically Signed   By: Lavonia Dana M.D.   On: 05/31/2020 09:49   DG CHEST PORT 1 VIEW  Result Date: 06/01/2020 CLINICAL DATA:  Hypoxia EXAM: PORTABLE CHEST 1 VIEW COMPARISON:  Chest radiograph May 29, 2020 and chest CT May 30, 2020 FINDINGS: Multifocal airspace opacity consistent with multifocal pneumonia is again noted. Areas of underlying fibrotic change also present, stable. Small right pleural effusion evident. Heart size and pulmonary vascularity are normal. No adenopathy. Port-A-Cath tip is in the superior vena cava. No pneumothorax. Degenerative change noted in each shoulder. IMPRESSION: Persistent multifocal airspace opacity with underlying fibrosis. No new opacity evident. Small right pleural effusion. Stable cardiac silhouette. Port-A-Cath tip in superior vena cava. Electronically Signed   By: Lowella Grip III M.D.   On: 06/01/2020 09:08   DG CHEST PORT 1 VIEW  Result Date: 05/29/2020 CLINICAL DATA:  Acute respiratory failure with hypoxia. EXAM: PORTABLE CHEST 1 VIEW  COMPARISON:  05/25/2020 FINDINGS: Right chest wall port a catheter tip is at the cavoatrial junction. Stable cardiomediastinal contours. Small to moderate right pleural effusion which appears partially loculated and extends over the lateral right upper lobe. Diffuse interstitial opacities are identified along with right upper lobe, right lower lobe and left lower lobe airspace disease. IMPRESSION: 1. Small to moderate loculated right pleural effusion Stable small to moderate right pleural effusion. 2. Diffuse interstitial opacities with bilateral multifocal airspace disease compatible with multifocal pneumonia. Electronically Signed   By: Kerby Moors M.D.   On: 05/29/2020 06:32    Microbiology: Recent Results (from the past  240 hour(s))  Respiratory Panel by RT PCR (Flu A&B, Covid) - Nasopharyngeal Swab     Status: None   Collection Time: 06/08/2020 10:02 AM   Specimen: Nasopharyngeal Swab  Result Value Ref Range Status   SARS Coronavirus 2 by RT PCR NEGATIVE NEGATIVE Final    Comment: (NOTE) SARS-CoV-2 target nucleic acids are NOT DETECTED.  The SARS-CoV-2 RNA is generally detectable in upper respiratoy specimens during the acute phase of infection. The lowest concentration of SARS-CoV-2 viral copies this assay can detect is 131 copies/mL. A negative result does not preclude SARS-Cov-2 infection and should not be used as the sole basis for treatment or other patient management decisions. A negative result may occur with  improper specimen collection/handling, submission of specimen other than nasopharyngeal swab, presence of viral mutation(s) within the areas targeted by this assay, and inadequate number of viral copies (<131 copies/mL). A negative result must be combined with clinical observations, patient history, and epidemiological information. The expected result is Negative.  Fact Sheet for Patients:  PinkCheek.be  Fact Sheet for Healthcare Providers:   GravelBags.it  This test is no t yet approved or cleared by the [place] FDA and  has been authorized for detection and/or diagnosis of SARS-CoV-2 by FDA under an Emergency Use Authorization (EUA). This EUA will remain  in effect (meaning this test can be used) for the duration of the COVID-19 declaration under Section 564(b)(1) of the Act, 21 U.S.C. section 360bbb-3(b)(1), unless the authorization is terminated or revoked sooner.     Influenza A by PCR NEGATIVE NEGATIVE Final   Influenza B by PCR NEGATIVE NEGATIVE Final    Comment: (NOTE) The Xpert Xpress SARS-CoV-2/FLU/RSV assay is intended as an aid in  the diagnosis of influenza from Nasopharyngeal swab specimens and  should not be used as a sole basis for treatment. Nasal washings and  aspirates are unacceptable for Xpert Xpress SARS-CoV-2/FLU/RSV  testing.  Fact Sheet for Patients: PinkCheek.be  Fact Sheet for Healthcare Providers: GravelBags.it  This test is not yet approved or cleared by the [place] FDA and  has been authorized for detection and/or diagnosis of SARS-CoV-2 by  FDA under an Emergency Use Authorization (EUA). This EUA will remain  in effect (meaning this test can be used) for the duration of the  Covid-19 declaration under Section 564(b)(1) of the Act, 21  U.S.C. section 360bbb-3(b)(1), unless the authorization is  terminated or revoked. Performed at [facility], [place] [address] [place], [place] [address]   Blood culture (routine single)     Status: None (Preliminary result)   Collection Time: 05/16/2020 10:06 AM   Specimen: BLOOD  Result Value Ref Range Status   Specimen Description   Final    BLOOD BLOOD RIGHT FOREARM Performed at [place] [address] [place], [place] [address]    Special Requests   Final    BOTTLES DRAWN AEROBIC ONLY Blood Culture results  may not be optimal due to an inadequate volume of blood received in culture bottles Performed at [place] [address], [place], [place] [address]    Culture   Final    NO GROWTH 4 DAYS Performed at [facility], [place] [address] [place], [place] [address]    Report Status PENDING  Incomplete  Culture, blood (single)     Status: None (Preliminary result)   Collection Time: 06/02/2020 12:31 PM   Specimen: BLOOD  Result Value Ref Range Status  Specimen Description   Final    BLOOD BLOOD RIGHT HAND Performed at [place] [address], [place], [place] [address]    Special Requests   Final    BOTTLES DRAWN AEROBIC ONLY Blood Culture results may not be optimal due to an inadequate volume of blood received in culture bottles Performed at [place] [address] [place], [place] [address]    Culture   Final    NO GROWTH 4 DAYS Performed at [facility], [place] [address], [place], [place] [address]    Report Status PENDING  Incomplete  Urine culture     Status: None   Collection Time: 06/08/20 12:47 AM   Specimen: In/Out Cath Urine  Result Value Ref Range Status   Specimen Description   Final    IN/OUT CATH URINE Performed at [place] [address], [place], [place] [address]    Special Requests   Final    NONE Performed at [facility], [place] [address] [place], [place] [address]    Culture   Final    NO GROWTH Performed at [facility], [place] [address] [place], [place] [address]    Report Status 06/08/2020 FINAL  Final  MRSA PCR Screening     Status: None   Collection Time: 06/08/20  1:30 PM   Specimen: Nasopharyngeal  Result Value Ref Range Status   MRSA by PCR NEGATIVE NEGATIVE Final    Comment:        The GeneXpert MRSA Assay (FDA approved for NASAL specimens only), is one component of a comprehensive MRSA colonization surveillance  program. It is not intended to diagnose MRSA infection nor to guide or monitor treatment for MRSA infections. Performed at [facility], [place] [address], [place], [place] [address]      Labs: Basic Metabolic Panel: Recent Labs  Lab 05/19/2020 1002 05/20/2020 1002 06/05/2020 2307 06/08/20 0445 06/08/20 0445 06/09/20 0401 06/10/20 0330  NA 149*  --   --  149*  --  151* 153*  K 4.2   < >  --  3.5   < > 3.1* 3.1*  CL 106  --   --  108  --  107 111  CO2 31  --   --  32  --  31 30  GLUCOSE 122*  --   --  109*  --  89 163*  BUN 32*  --   --  25*  --  24* 36*  CREATININE 0.55*  --  0.50* 0.48*  --  0.53* 0.56*  CALCIUM 9.4  --   --  8.8*  --  8.7* 8.8*  MG  --   --   --   --   --  2.1 2.2   < > = values in this interval not displayed.   Liver Function Tests: Recent Labs  Lab 06/09/2020 1002 06/08/20 0445  AST 78* 54*  ALT 163* 115*  ALKPHOS 489* 429*  BILITOT 1.0 1.0  PROT 5.7* 5.3*  ALBUMIN 2.4* 2.2*   No results for input(s): LIPASE, AMYLASE in the last 168 hours. No results for input(s): AMMONIA in the last 168 hours. CBC: Recent Labs  Lab 06/09/2020 1002 06/03/2020 2307 06/08/20 0445 06/09/20 0401 06/10/20 0330  WBC 26.4* 24.7* 23.8* 22.5* 20.4*  NEUTROABS 24.1*  --   --  20.6* 19.4*  HGB 9.9* 9.1* 8.8* 8.7* 9.3*  HCT 31.4* 28.5* 28.3* 28.7*  30.0*  MCV 107.5* 107.5* 108.4* 110.4* 107.5*  PLT 249 205 198 192 215   Cardiac Enzymes: No results for input(s): CKTOTAL, CKMB, CKMBINDEX, TROPONINI in the last 168 hours. D-Dimer No results for input(s): DDIMER in the last 72 hours. BNP: Invalid input(s): POCBNP CBG: No results for input(s): GLUCAP in the last 168 hours. Anemia work up No results for input(s): VITAMINB12, FOLATE, FERRITIN, TIBC, IRON, RETICCTPCT in the last 72 hours. Urinalysis    Component Value Date/Time   COLORURINE YELLOW 06/08/2020 0047   APPEARANCEUR CLEAR 06/08/2020 0047   LABSPEC 1.020 06/08/2020 0047   PHURINE 6.0  06/08/2020 0047   GLUCOSEU NEGATIVE 06/08/2020 0047   GLUCOSEU NEGATIVE 07/20/2017 1056   HGBUR MODERATE (A) 06/08/2020 0047   BILIRUBINUR NEGATIVE 06/08/2020 0047   [place] 06/08/2020 0047   PROTEINUR 30 (A) 06/08/2020 0047   UROBILINOGEN 0.2 07/20/2017 1056   NITRITE NEGATIVE 06/08/2020 0047   LEUKOCYTESUR NEGATIVE 06/08/2020 0047   Sepsis Labs Invalid input(s): PROCALCITONIN,  WBC,  LACTICIDVEN     SIGNED:  Dwyane Dee, MD  [facility] 06/19/2020, 12:10 PM

## 2020-07-12 NOTE — Progress Notes (Signed)
Daily Progress Note   Patient Name: Peter Wood       Date: 2020/06/19 DOB: September 03, 1935  Age: 85 y.o. MRN#: [id number] Attending Physician: Dwyane Dee, MD Primary Care Physician: Cassandria Anger, MD Admit Date: 06/04/2020  Reason for Consultation/Follow-up: Establishing goals of care and Terminal Care  Subjective: Patient actively dying. Lethargic to sternal rub. Appears slightly uncomfortable with accessory muscle usage. Remains on NRB and HFNC. No family at bedside.   Discussed with bedside RN. Scheduling IV morphine. Will call family and provide update that patient is getting closer to EOL.   ADDENDUM 5621: Received secure epic chat from RN with update that patient has passed.   Length of Stay: 4  Current Medications: Scheduled Meds:  . latanoprost  1 drop Both Eyes QHS  . mouth rinse  15 mL Mouth Rinse BID  .  morphine injection  1 mg Intravenous Q4H    Continuous Infusions: . sodium chloride 10 mL/hr at 06/08/20 1309    PRN Meds: sodium chloride, acetaminophen **OR** acetaminophen, glycopyrrolate, LORazepam, morphine injection, morphine CONCENTRATE, ondansetron **OR** ondansetron (ZOFRAN) IV, sodium chloride flush  Physical Exam Vitals and nursing note reviewed.  Constitutional:      Appearance: He is cachectic. He is ill-appearing.     Comments: Actively dying  HENT:     Head: Normocephalic and atraumatic.  Cardiovascular:     Rate and Rhythm: Tachycardia present.  Pulmonary:     Effort: Tachypnea and accessory muscle usage present. No respiratory distress.     Comments: NRB and HFNC, gasping. Scheduled IV morphine Skin:    General: Skin is warm and dry.  Neurological:     Mental Status: He is lethargic.  Psychiatric:        Attention and  Perception: He is inattentive.        Speech: He is noncommunicative.            Vital Signs: BP 107/76 (BP Location: Right Arm)   Pulse (!) 106   Temp 98.4 F (36.9 C) (Oral)   Resp 20   Ht 5\' 4"  (1.626 m)   Wt 61.2 kg   SpO2 100%   BMI 23.17 kg/m  SpO2: SpO2: 100 % O2 Device: O2 Device: High Flow Nasal Cannula, NRB O2 Flow Rate: O2 Flow Rate (L/min): 15 L/min  Intake/output summary:   Intake/Output Summary (Last 24 hours) at 07-02-20 0754 Last data filed at Jul 02, 2020 0600 Gross per 24 hour  Intake 119.33 ml  Output 750 ml  Net -630.67 ml   LBM: Last BM Date: 06/03/20 Baseline Weight: Weight: 61.2 kg Most recent weight: Weight: 61.2 kg       Palliative Assessment/Data: PPS 10%      Patient Active Problem List   Diagnosis Date Noted  . Dyspnea   . Protein-calorie malnutrition, severe ([place]) 06/08/2020  . Macrocytic anemia 06/08/2020  . Hypernatremia 06/08/2020  . Severe sepsis ([place]) 05/22/2020  . Terminal care   . Palliative care by specialist   . Acute respiratory failure with hypoxia ([place]) 05/27/2020  . Chronic diastolic CHF (congestive heart failure) ([place]) 05/27/2020  . Community acquired pneumonia 05/26/2020  . Hypoalbuminemia 05/26/2020  . Abnormal LFTs 05/26/2020  . Iron deficiency anemia due to chronic blood loss 12/26/2019  . Port-A-Cath in place 12/18/2018  . Anemia 11/12/2018  . Anemia due to antineoplastic chemotherapy 11/06/2018  . Genetic testing 10/14/2018  . Metastatic malignant neoplasm ([place]) 10/04/2018  . Gastric cancer ([place]) 10/04/2018  . Family history of prostate cancer   . Family history of breast cancer   . Aortic atherosclerosis ([place]) 10/03/2018  . Acute pulmonary embolus ([place]) 09/20/2018  . Pulmonary embolism ([place]) 09/19/2018  . Liver metastases ([place]) 09/19/2018  . Abdominal neoplasm without bowel obstruction 09/19/2018  . Nausea 09/19/2018  . Abdominal pain 09/19/2018  . Upper respiratory infection 08/01/2018  . Varicose  vein of leg 07/22/2018  . Weight loss 01/18/2018  . Claudication ([place]) 01/16/2017  . Macular hole, right 09/19/2016  . Macular hole, right eye 09/19/2016  . Right sciatic nerve pain 07/19/2016  . Finger mass, right 06/24/2015  . Prostate cancer ([place]) 09/16/2013  . Rash and nonspecific skin eruption 02/12/2013  . Goals of care, counseling/discussion 09/01/2011  . TOBACCO USE, QUIT 08/25/2009  . ERECTILE DYSFUNCTION 03/03/2009  . PSA, INCREASED 03/03/2009  . Essential hypertension 06/10/2007  . DIVERTICULOSIS, COLON 06/10/2007  . COLONIC POLYPS, HX OF 06/10/2007    Palliative Care Assessment & Plan   Patient Profile: Briefly, Mr. Prime is an 85 year old male with past medical history of prostate cancer status post XRT, gastric metastatic adenocarcinoma diagnosed in January 2020 status post chemotherapy, recent admission with respiratory failure thought to be most likely pneumonitis related to treatment versus (more likely) lymphangitic spread.  He was discharged home with hospice services yesterday but presented to the emergency room after he developed fever at home and was sleepier and family called EMS.  Palliative consulted for goals of care.  Assessment: Metastatic gastric cancer, likely lymphangitic spread Acute respiratory failure Multifocal pneumonia Persistent loculated RU pleural effusion Sepsis Chronic diastolic CHF Severe protein calorie malnutrition  Recommendations/Plan:  After further GOC discussion with wife, daughter, and grandson on 06/10/20, family agrees with medical recommendation for transition to comfort measures only to ensure Mr. Dyar does not struggle as he nears EOL.  Comfort meds added to [facility]. Scheduled IV Morphine as patient appears uncomfortable this AM.   Comfort feeds per patient/family request.  Unrestricted visitor access.  Spiritual care consult for EOL care and prayer.  Continue current oxygen requirements as family is coming from out  of state today. Educated on life-prolonging nature of NRB and HFNC and encouraged family consider weaning once other family arrives and as long as patient is comfortable with comfort medications.   PMT provider will continue to follow. Anticipate hospital demise.  Unstable for transfer to hospice facility.   Code Status: DNR   Code Status Orders  (From admission, onward)         Start     Ordered   05/29/2020 2307  Do not attempt resuscitation (DNR)  Continuous       Question Answer Comment  In the event of cardiac or respiratory ARREST Do not call a "code blue"   In the event of cardiac or respiratory ARREST Do not perform Intubation, CPR, defibrillation or ACLS   In the event of cardiac or respiratory ARREST Use medication by any route, position, wound care, and other measures to relive pain and suffering. May use oxygen, suction and manual treatment of airway obstruction as needed for comfort.      05/27/2020 2306        Code Status History    Date Active Date Inactive Code Status Order ID Comments User Context   05/15/2020 0947 06/08/2020 2306 DNR [id number]  Dorie Rank, MD ED   05/30/2020 1059 06/06/2020 1741 DNR [id number]  Marshell Garfinkel, MD Inpatient   05/26/2020 0109 05/30/2020 1059 Full Code [id number]  Mikki Harbor, MD ED   09/19/2018 1718 09/25/2018 1751 Full Code [id number]  Janora Norlander, MD ED   09/19/2016 1634 09/20/2016 1206 Full Code [id number]  [name], MD Inpatient   Advance Care Planning Activity       Prognosis:  Hours-days  Discharge Planning:  Anticipated Hospital Death  Care plan was discussed with   Thank you for allowing the Palliative Medicine Team to assist in the care of this patient.   Total Time 20 Prolonged Time Billed  no    Greater than 50% of this time was spent counseling and coordinating care related to the above assessment and plan.   [name], DNP, FNP-C Palliative Medicine Team  Phone: [phone] Fax:  [phone]  Please contact Palliative Medicine Team phone at [phone] for questions and concerns.

## 2020-07-12 DEATH — deceased

## 2020-07-14 ENCOUNTER — Encounter (INDEPENDENT_AMBULATORY_CARE_PROVIDER_SITE_OTHER): Payer: Medicare Other | Admitting: Ophthalmology

## 2020-07-21 ENCOUNTER — Other Ambulatory Visit: Payer: Medicare Other

## 2020-07-21 ENCOUNTER — Ambulatory Visit: Payer: Medicare Other | Admitting: Oncology

## 2020-07-22 ENCOUNTER — Encounter (HOSPITAL_COMMUNITY): Payer: Medicare Other | Admitting: Internal Medicine

## 2020-07-22 ENCOUNTER — Other Ambulatory Visit (HOSPITAL_COMMUNITY): Payer: Medicare Other
